# Patient Record
Sex: Male | Born: 1945
Health system: Southern US, Community
[De-identification: ages and names within clinical notes are randomized; demographics above are authoritative.]

## PROBLEM LIST (undated history)

## (undated) DIAGNOSIS — Z8579 Personal history of other malignant neoplasms of lymphoid, hematopoietic and related tissues: Secondary | ICD-10-CM

## (undated) DIAGNOSIS — G4730 Sleep apnea, unspecified: Secondary | ICD-10-CM

## (undated) DIAGNOSIS — C61 Malignant neoplasm of prostate: Secondary | ICD-10-CM

## (undated) DIAGNOSIS — T4145XA Adverse effect of unspecified anesthetic, initial encounter: Secondary | ICD-10-CM

## (undated) DIAGNOSIS — N189 Chronic kidney disease, unspecified: Secondary | ICD-10-CM

## (undated) DIAGNOSIS — I739 Peripheral vascular disease, unspecified: Secondary | ICD-10-CM

## (undated) DIAGNOSIS — J189 Pneumonia, unspecified organism: Secondary | ICD-10-CM

## (undated) DIAGNOSIS — T8859XA Other complications of anesthesia, initial encounter: Secondary | ICD-10-CM

## (undated) DIAGNOSIS — D759 Disease of blood and blood-forming organs, unspecified: Secondary | ICD-10-CM

## (undated) DIAGNOSIS — M2550 Pain in unspecified joint: Secondary | ICD-10-CM

## (undated) DIAGNOSIS — M1990 Unspecified osteoarthritis, unspecified site: Secondary | ICD-10-CM

## (undated) DIAGNOSIS — D7582 Heparin induced thrombocytopenia (HIT): Secondary | ICD-10-CM

## (undated) DIAGNOSIS — E785 Hyperlipidemia, unspecified: Secondary | ICD-10-CM

## (undated) DIAGNOSIS — I82409 Acute embolism and thrombosis of unspecified deep veins of unspecified lower extremity: Secondary | ICD-10-CM

## (undated) DIAGNOSIS — E8581 Light chain (AL) amyloidosis: Secondary | ICD-10-CM

## (undated) DIAGNOSIS — G8250 Quadriplegia, unspecified: Secondary | ICD-10-CM

## (undated) HISTORY — PX: PROSTATECTOMY: SHX69

## (undated) HISTORY — DX: Sleep apnea, unspecified: G47.30

## (undated) HISTORY — PX: LIPOMA EXCISION: SHX5283

## (undated) HISTORY — PX: POSTERIOR LAMINECTOMY / DECOMPRESSION LUMBAR SPINE: SUR740

## (undated) HISTORY — PX: LIMBAL STEM CELL TRANSPLANT: SHX1969

## (undated) HISTORY — PX: HERNIA REPAIR: SHX51

## (undated) HISTORY — DX: Hyperlipidemia, unspecified: E78.5

## (undated) HISTORY — PX: TONSILLECTOMY: SUR1361

## (undated) HISTORY — PX: BACK SURGERY: SHX140

## (undated) HISTORY — DX: Disease of blood and blood-forming organs, unspecified: D75.9

## (undated) HISTORY — PX: OTHER SURGICAL HISTORY: SHX169

## (undated) HISTORY — PX: COLONOSCOPY: SHX174

## (undated) HISTORY — DX: Malignant neoplasm of prostate: C61

---

## 2001-12-27 ENCOUNTER — Ambulatory Visit (HOSPITAL_COMMUNITY): Admission: RE | Admit: 2001-12-27 | Discharge: 2001-12-27 | Payer: Self-pay | Admitting: *Deleted

## 2003-07-19 ENCOUNTER — Ambulatory Visit (HOSPITAL_BASED_OUTPATIENT_CLINIC_OR_DEPARTMENT_OTHER): Admission: RE | Admit: 2003-07-19 | Discharge: 2003-07-19 | Payer: Self-pay | Admitting: Family Medicine

## 2003-12-25 ENCOUNTER — Ambulatory Visit: Admission: RE | Admit: 2003-12-25 | Discharge: 2004-01-27 | Payer: Self-pay | Admitting: Radiation Oncology

## 2004-01-29 ENCOUNTER — Inpatient Hospital Stay (HOSPITAL_COMMUNITY): Admission: RE | Admit: 2004-01-29 | Discharge: 2004-02-01 | Payer: Self-pay | Admitting: Urology

## 2006-12-07 ENCOUNTER — Encounter: Admission: RE | Admit: 2006-12-07 | Discharge: 2006-12-07 | Payer: Self-pay | Admitting: Orthopedic Surgery

## 2007-01-23 ENCOUNTER — Encounter: Admission: RE | Admit: 2007-01-23 | Discharge: 2007-01-23 | Payer: Self-pay | Admitting: Neurosurgery

## 2007-09-27 ENCOUNTER — Inpatient Hospital Stay (HOSPITAL_COMMUNITY): Admission: RE | Admit: 2007-09-27 | Discharge: 2007-09-30 | Payer: Self-pay | Admitting: Neurosurgery

## 2007-09-27 ENCOUNTER — Encounter (INDEPENDENT_AMBULATORY_CARE_PROVIDER_SITE_OTHER): Payer: Self-pay | Admitting: Neurosurgery

## 2007-11-23 HISTORY — PX: CARPAL TUNNEL RELEASE: SHX101

## 2008-09-02 ENCOUNTER — Ambulatory Visit (HOSPITAL_COMMUNITY): Admission: RE | Admit: 2008-09-02 | Discharge: 2008-09-02 | Payer: Self-pay | Admitting: Neurosurgery

## 2008-11-22 HISTORY — PX: CARDIAC CATHETERIZATION: SHX172

## 2009-11-17 ENCOUNTER — Inpatient Hospital Stay (HOSPITAL_BASED_OUTPATIENT_CLINIC_OR_DEPARTMENT_OTHER): Admission: RE | Admit: 2009-11-17 | Discharge: 2009-11-17 | Payer: Self-pay | Admitting: Interventional Cardiology

## 2011-04-06 NOTE — Op Note (Signed)
NAME:  Brent Allison, COSMA NO.:  [id number]   MEDICAL RECORD NO.:  [id number]          PATIENT TYPE:  AMB   LOCATION:  SDS                          FACILITY:  [facility]   PHYSICIAN:  Cristi Loron, M.D.DATE OF BIRTH:  1945/11/25   DATE OF PROCEDURE:  09/02/2008  DATE OF DISCHARGE:                               OPERATIVE REPORT   BRIEF HISTORY:  The patient is a 65 year old white male who suffered  from bilateral hand numbness, left greater than right consistent with  carpal tunnel syndrome.  He failed medical management and was worked up  with NCVs, which concerned bilateral carpal tunnel syndrome.  I  discussed various treatments, including surgery.  The patient has  weighed the risks, benefits, and alternatives of surgery and decided to  proceed with a left carpal tunnel release.   PREOPERATIVE DIAGNOSIS:  Left carpal tunnel syndrome.   POSTOPERATIVE DIAGNOSIS:  Left carpal tunnel syndrome.   PROCEDURE:  Left carpal tunnel release (median nerve neurolysis at the  wrist).   SURGEON:  Cristi Loron, MD   ASSISTANT:  None.   ANESTHESIA:  MAC.   ESTIMATED BLOOD LOSS:  Minimal.   SPECIMENS:  None.   DRAINS:  None.   COMPLICATIONS:  None.   DESCRIPTION OF PROCEDURE:  The patient was brought to the operating room  by the anesthesia team and MAC anesthesia was induced.  The patient's  left hand and arm was then prepared with Betadine scrub and Betadine  solution.  Sterile drapes were applied and then injected the area to be  incised with Marcaine solution.  I then used a scalpel to make a  incision near the patient's palmar crease in the midline.  I then used a  Horticulturist, commercial for exposure and then dissected deeper.  We divided  the superficial fascia with the 15-blade scalpel and then exposed the  transverse carpal ligament.  We used a 15-blade scalpel to incise the  ligament and we identified the median nerve.  Staying along the ulnar  aspect of  the median nerve, we divided the transverse carpal ligament  proximally and distally using a 15-blade scalpel and scissors.  We got a  good decompression of the nerve both proximally and distally.  We then  obtained hemostasis using bipolar cautery.  We irrigated the wound with  bacitracin solution and then removed the retractor.  We reapproximated  the patient's subcutaneous tissue with interrupted 3-0 Vicryl suture and  the skin with a running 3-0 nylon suture.  The wound was then cleansed  with bacitracin solution, coated with  bacitracin ointment, and a sterile dressing was applied.  The drapes  were removed and the patient was subsequently transported to the  Postanesthesia Care Unit in stable condition.  All sponge, instrument,  and needle counts were correct at the end of this case.      Cristi Loron, M.D.  Electronically Signed     JDJ/MEDQ  D:  09/02/2008  T:  09/02/2008  Job:  [id number]

## 2011-04-06 NOTE — Discharge Summary (Signed)
NAME:  Brent, Allison NO.:  [id number]   MEDICAL RECORD NO.:  [id number]          PATIENT TYPE:  INP   LOCATION:  3008                         FACILITY:  [facility]   PHYSICIAN:  Stefani Dama, M.D.  DATE OF BIRTH:  1946-01-04   DATE OF ADMISSION:  09/27/2007  DATE OF DISCHARGE:                               DISCHARGE SUMMARY   ADMITTING DIAGNOSIS:  Lumbar spondylolisthesis L4-L5 with lumbar  radiculopathy and lumbago.   FINAL DIAGNOSIS:  Lumbar spondylolisthesis L4-L5, acquired with facet  arthropathy, disk degeneration, lumbar stenosis, lumbar radiculopathy  and lumbago.   CONDITION ON DISCHARGE:  Improving.   HOSPITAL COURSE:  [name] is a 65 year old individual who was  had significant back pain and bilateral lower extremity pain.  He was  advised regarding the need for surgical decompression and arthrodesis  having failed efforts at conservative management.  He was taken to the  operating room on September 27, 2007, when he underwent surgical  decompression arthrodesis with posterior lumbar interbody fixation and  fusion.  He tolerated this procedure well.  Postoperatively, he was  mobilized, Foley catheter was removed the day after surgery.  His  incision has remained clean and dry and clinically the patient is  tolerating oral pain medications.  He has been advised as to his  postoperative activities.  He is given prescriptions for Percocet 10/650  for pain control and he will be seen in the office in approximately  three weeks' time for further follow-up.  He is also given a  prescription for Valium 5 mg.      Stefani Dama, M.D.  Electronically Signed     HJE/MEDQ  D:  09/30/2007  T:  10/01/2007  Job:  [id number]

## 2011-04-06 NOTE — Op Note (Signed)
NAME:  Brent Allison, Brent Allison NO.:  [id number]   MEDICAL RECORD NO.:  [id number]          PATIENT TYPE:  INP   LOCATION:  3008                         FACILITY:  [facility]   PHYSICIAN:  Cristi Loron, M.D.DATE OF BIRTH:  February 07, 1946   DATE OF PROCEDURE:  09/27/2007  DATE OF DISCHARGE:                               OPERATIVE REPORT   BRIEF HISTORY:  The patient is a 65 year old white male who has suffered  from signs and symptoms consistent with neurogenic claudication.  He  failed medical management, was worked up with a lumbar MRI, which  demonstrated he had a spondylolisthesis and severe multifactorial spinal  stenosis at L4-L5.  I discussed the various treatment options with the  patient including surgery.  He has weighed the risks, benefits, and  alternatives of surgery and decided to proceed with an L4-L5  decompression and fusion.   PREOPERATIVE DIAGNOSES:  L4-L5 grade 1 acquired spondylolisthesis facet  arthropathy, disc degeneration, stenosis, lumbar radiculopathy, and  lumbago.   POSTOPERATIVE DIAGNOSES:  L4-L5 grade 1 acquired spondylolisthesis facet  arthropathy, disc degeneration, stenosis, lumbar radiculopathy, and  lumbago.   PROCEDURE:  Bilateral L4 decompressive laminotomies to treat the severe  spinal stenosis at L4-L5 and decompressing bilateral L4-L5 nerve roots;  L4-L5 posterior lumbar interbody fusion with local morselized autograft  bone; insertion of L4-L5 interbody prosthesis (Capstone PEEK interbody  prosthesis); L4-L5 posterior nonsegmental instrumentation with Legacy  titanium pedicle screws and rods; L4-L5 posterolateral arthrodesis with  local morselized autograft and VITOSS bone-graft extender.   SURGEON:  Cristi Loron, M.D.   ASSISTANT:  Clydene Fake, M.D.   ANESTHESIA:  General endotracheal.   ESTIMATED BLOOD LOSS:  400 mL.   SPECIMENS:  A facet cyst on the left.   DRAINS:  None.   COMPLICATIONS:  None.   DESCRIPTION OF PROCEDURE:  The patient was brought to the operating room  by the anesthesia team.  General endotracheal anesthesia was induced.  The patient was turned to the prone position on Wilson frame.  His  lumbosacral region was then shaved with clippers and prepared with  Betadine scrub and Betadine solution.  Sterile drapes were applied.  I  then injected the area to be incised with Marcaine with epinephrine  solution and used a scalpel to make a linear midline incision over the  L4-L5 interspace.  I used electrocautery to perform bilateral  subperiosteal dissection exposing the spinous process lamina of the L3,  L4, and L5.  We obtained intraoperative radiograph to confirm our  location and then inserted the Versa-Trac retractor for exposure.  We  encountered quite a bit of facet arthropathy, in fact there was a large  cyst emanating from the left L4-L5 facet.  We removed part of the cyst  and sent it for permanent section.   We began the decompression by performing bilateral L4 laminotomies.  We  widened the laminotomies and performed a medial facetectomy at L4-L5; we  encountered severe stenosis bilaterally.  We removed the excess ligament  flavum from lateral recesses and performed foraminotomies about the  bilateral L4 and L5 nerve root.  Of note, this decompression was in  excess to what was needed to perform the interbody fusion; it was needed  because of severe stenosis at this level.   Having completed the decompression, we now turned attention to  arthrodesis.  We incised the L4-L5 intervertebral disc and performed a  intervertebral discectomy bilaterally.  We prepared the vertebral  endplates for fusion by using the curettes, removing the soft tissue.  We used trial spacers and determined to use a 14 mm x 26 mm interbody  Capstone prosthesis.  We prefilled the prosthesis with a combination of  local autograft bone and VITOSS, and then filled ventral, lateral, and   medial disc space with VITOSS and local autograft bone.  We then  inserted the prosthesis into the interspace, of course after retracting  the neural structures out of the harm's way.  There was a good snug fit  of the prosthesis bilaterally.  This completed the posterior lumbar  interbody fusion and insertion of the prosthesis.   We now turned attention to the posterior nonsegmental instrumentation.  We under fluoroscopic guidance cannulated the bilateral L4 and L5  pedicles with bone probe.  We tapped the pedicles with a 5.5 mm tap and  then inserted a 6.5 mm x 55 mm pedicle screws bilaterally at L4 and 6.5  mm x 55 mm bilaterally at L5 under fluoroscopic guidance.  I should  mention prior to placing the pedicle screws, we probed inside the tapped  pedicles to rule out cortical breeches.  After placing the pedicle  screws, we palpated along the medial aspect of the bilateral L4 and L5  pedicles and noted there were no cortical breeches, and the L4 and L5  nerve roots were not injured.  We then connected the unilateral pedicle  screws with a lordotic rod.  We compressed the construct and then  secured the rod in place with the capsule, which we tightened  appropriately completing the instrumentation.   We now turned attention to posterolateral arthrodesis.  We used a high-  speed drill to decorticate the bilateral L4 and L5 facet joint and pars  region and transverse processes.  We laid a combination of local  morselized autograft bone and VITOSS bone-graft extender over these  decorticated posterolateral structures completing the posterolateral  arthrodesis.  We then inspected the dura and palpated along the ventral  surface of the dura and along the exit route of the bilateral L4 and L5  nerve roots and noted the neural structures were well decompressed.  We  obtained hemostasis using bipolar cautery.  We irrigated the wound out  with bacitracin solution and then removed the  retractor.  We  reapproximated the patient's thoracolumbar fascia with interrupted #1  Vicryl suture.  The subcutaneous tissue with interrupted 2-0 Vicryl  suture and the skin with Steri-Strips and Benzoin.  The wound was then  coated with bacitracin ointment.  A sterile dressing was applied.  The  drapes were removed, and the patient was subsequently returned to the  supine position where he was extubated by the anesthesia team and  transported to the post-anesthesia care unit in a stable condition.  All  sponge, instrument, and needle counts were correct at the end of this  case.      Cristi Loron, M.D.  Electronically Signed     JDJ/MEDQ  D:  09/27/2007  T:  09/28/2007  Job:  [id number]

## 2011-06-10 ENCOUNTER — Other Ambulatory Visit: Payer: Self-pay | Admitting: Ophthalmology

## 2011-07-05 ENCOUNTER — Other Ambulatory Visit: Payer: Self-pay | Admitting: Oncology

## 2011-07-05 ENCOUNTER — Encounter (HOSPITAL_BASED_OUTPATIENT_CLINIC_OR_DEPARTMENT_OTHER): Payer: Medicare Other | Admitting: Oncology

## 2011-07-05 DIAGNOSIS — E8589 Other amyloidosis: Secondary | ICD-10-CM

## 2011-07-05 DIAGNOSIS — C61 Malignant neoplasm of prostate: Secondary | ICD-10-CM

## 2011-07-05 LAB — CBC WITH DIFFERENTIAL/PLATELET
BASO%: 0.4 % (ref 0.0–2.0)
Basophils Absolute: 0 10*3/uL (ref 0.0–0.1)
EOS%: 1.6 % (ref 0.0–7.0)
Eosinophils Absolute: 0.1 10*3/uL (ref 0.0–0.5)
HCT: 38 % — ABNORMAL LOW (ref 38.4–49.9)
HGB: 13.1 g/dL (ref 13.0–17.1)
LYMPH%: 25 % (ref 14.0–49.0)
MCH: 32.4 pg (ref 27.2–33.4)
MCHC: 34.3 g/dL (ref 32.0–36.0)
MCV: 94.4 fL (ref 79.3–98.0)
MONO#: 0.7 10*3/uL (ref 0.1–0.9)
MONO%: 8.9 % (ref 0.0–14.0)
NEUT#: 4.9 10*3/uL (ref 1.5–6.5)
NEUT%: 64.1 % (ref 39.0–75.0)
Platelets: 184 10*3/uL (ref 140–400)
RBC: 4.03 10*6/uL — ABNORMAL LOW (ref 4.20–5.82)
RDW: 13.3 % (ref 11.0–14.6)
WBC: 7.7 10*3/uL (ref 4.0–10.3)
lymph#: 1.9 10*3/uL (ref 0.9–3.3)

## 2011-07-05 LAB — COMPREHENSIVE METABOLIC PANEL
ALT: 13 U/L (ref 0–53)
AST: 19 U/L (ref 0–37)
Albumin: 3.5 g/dL (ref 3.5–5.2)
Alkaline Phosphatase: 82 U/L (ref 39–117)
BUN: 17 mg/dL (ref 6–23)
CO2: 29 mEq/L (ref 19–32)
Calcium: 9.9 mg/dL (ref 8.4–10.5)
Chloride: 103 mEq/L (ref 96–112)
Creatinine, Ser: 0.89 mg/dL (ref 0.50–1.35)
Glucose, Bld: 91 mg/dL (ref 70–99)
Potassium: 3.7 mEq/L (ref 3.5–5.3)
Sodium: 139 mEq/L (ref 135–145)
Total Bilirubin: 0.3 mg/dL (ref 0.3–1.2)
Total Protein: 6.2 g/dL (ref 6.0–8.3)

## 2011-07-05 LAB — URINALYSIS, MICROSCOPIC - CHCC
Bilirubin (Urine): NEGATIVE
Blood: NEGATIVE
Glucose: NEGATIVE g/dL
Ketones: NEGATIVE mg/dL
Leukocyte Esterase: NEGATIVE
Nitrite: NEGATIVE
Protein: NEGATIVE mg/dL
RBC count: NEGATIVE (ref 0–2)
Specific Gravity, Urine: 1.015 (ref 1.003–1.035)
WBC, UA: NEGATIVE (ref 0–2)
pH: 6.5 (ref 4.6–8.0)

## 2011-07-05 LAB — LACTATE DEHYDROGENASE: LDH: 160 U/L (ref 94–250)

## 2011-07-08 LAB — SPEP & IFE WITH QIG
Albumin ELP: 61.7 % (ref 55.8–66.1)
Alpha-1-Globulin: 4.6 % (ref 2.9–4.9)
Alpha-2-Globulin: 12.7 % — ABNORMAL HIGH (ref 7.1–11.8)
Beta 2: 4.5 % (ref 3.2–6.5)
Beta Globulin: 6.2 % (ref 4.7–7.2)
Gamma Globulin: 10.3 % — ABNORMAL LOW (ref 11.1–18.8)
IgA: 92 mg/dL (ref 68–379)
IgG (Immunoglobin G), Serum: 632 mg/dL — ABNORMAL LOW (ref 650–1600)
IgM, Serum: 61 mg/dL (ref 41–251)
Total Protein, Serum Electrophoresis: 5.9 g/dL — ABNORMAL LOW (ref 6.0–8.3)

## 2011-07-08 LAB — KAPPA/LAMBDA LIGHT CHAINS
Kappa free light chain: 0.95 mg/dL (ref 0.33–1.94)
Kappa:Lambda Ratio: 0.02 — ABNORMAL LOW (ref 0.26–1.65)
Lambda Free Lght Chn: 50.4 mg/dL — ABNORMAL HIGH (ref 0.57–2.63)

## 2011-07-08 LAB — BETA 2 MICROGLOBULIN, SERUM: Beta-2 Microglobulin: 1.98 mg/L — ABNORMAL HIGH (ref 1.01–1.73)

## 2011-07-22 ENCOUNTER — Encounter (HOSPITAL_BASED_OUTPATIENT_CLINIC_OR_DEPARTMENT_OTHER): Payer: Medicare Other | Admitting: Oncology

## 2011-07-22 ENCOUNTER — Ambulatory Visit (HOSPITAL_COMMUNITY)
Admission: RE | Admit: 2011-07-22 | Discharge: 2011-07-22 | Disposition: A | Discharge status: Home / Self Care | Payer: Medicare Other | Source: Ambulatory Visit | Attending: Oncology | Admitting: Oncology

## 2011-07-22 ENCOUNTER — Other Ambulatory Visit: Payer: Self-pay | Admitting: Oncology

## 2011-07-22 DIAGNOSIS — M47812 Spondylosis without myelopathy or radiculopathy, cervical region: Secondary | ICD-10-CM | POA: Insufficient documentation

## 2011-07-22 DIAGNOSIS — Z981 Arthrodesis status: Secondary | ICD-10-CM | POA: Insufficient documentation

## 2011-07-22 DIAGNOSIS — M5137 Other intervertebral disc degeneration, lumbosacral region: Secondary | ICD-10-CM | POA: Insufficient documentation

## 2011-07-22 DIAGNOSIS — C9000 Multiple myeloma not having achieved remission: Secondary | ICD-10-CM

## 2011-07-22 DIAGNOSIS — C61 Malignant neoplasm of prostate: Secondary | ICD-10-CM

## 2011-07-22 DIAGNOSIS — M51379 Other intervertebral disc degeneration, lumbosacral region without mention of lumbar back pain or lower extremity pain: Secondary | ICD-10-CM | POA: Insufficient documentation

## 2011-07-22 DIAGNOSIS — E8589 Other amyloidosis: Secondary | ICD-10-CM

## 2011-07-29 ENCOUNTER — Other Ambulatory Visit: Payer: Self-pay | Admitting: Oncology

## 2011-07-29 ENCOUNTER — Other Ambulatory Visit (HOSPITAL_COMMUNITY)
Admission: RE | Admit: 2011-07-29 | Discharge: 2011-07-29 | Disposition: A | Discharge status: Home / Self Care | Payer: Medicare Other | Source: Ambulatory Visit | Attending: Oncology | Admitting: Oncology

## 2011-07-29 ENCOUNTER — Encounter (HOSPITAL_BASED_OUTPATIENT_CLINIC_OR_DEPARTMENT_OTHER): Payer: Medicare Other | Admitting: Oncology

## 2011-07-29 DIAGNOSIS — E8589 Other amyloidosis: Secondary | ICD-10-CM

## 2011-07-29 DIAGNOSIS — E859 Amyloidosis, unspecified: Secondary | ICD-10-CM | POA: Insufficient documentation

## 2011-07-29 DIAGNOSIS — R809 Proteinuria, unspecified: Secondary | ICD-10-CM | POA: Insufficient documentation

## 2011-07-29 DIAGNOSIS — D72822 Plasmacytosis: Secondary | ICD-10-CM | POA: Insufficient documentation

## 2011-07-29 LAB — DIFFERENTIAL
Basophils Absolute: 0 10*3/uL (ref 0.0–0.1)
Basophils Relative: 0 % (ref 0–1)
Eosinophils Absolute: 0.1 10*3/uL (ref 0.0–0.7)
Eosinophils Relative: 2 % (ref 0–5)
Lymphocytes Relative: 26 % (ref 12–46)
Lymphs Abs: 1.7 10*3/uL (ref 0.7–4.0)
Monocytes Absolute: 0.8 10*3/uL (ref 0.1–1.0)
Monocytes Relative: 12 % (ref 3–12)
Neutro Abs: 4.1 10*3/uL (ref 1.7–7.7)
Neutrophils Relative %: 60 % (ref 43–77)

## 2011-07-29 LAB — CBC
HCT: 39.6 % (ref 39.0–52.0)
Hemoglobin: 13.4 g/dL (ref 13.0–17.0)
MCH: 31.3 pg (ref 26.0–34.0)
MCHC: 33.8 g/dL (ref 30.0–36.0)
MCV: 92.5 fL (ref 78.0–100.0)
Platelets: 213 10*3/uL (ref 150–400)
RBC: 4.28 MIL/uL (ref 4.22–5.81)
RDW: 12.9 % (ref 11.5–15.5)
WBC: 6.8 10*3/uL (ref 4.0–10.5)

## 2011-08-04 ENCOUNTER — Encounter (HOSPITAL_BASED_OUTPATIENT_CLINIC_OR_DEPARTMENT_OTHER): Payer: Medicare Other | Admitting: Oncology

## 2011-08-04 DIAGNOSIS — C61 Malignant neoplasm of prostate: Secondary | ICD-10-CM

## 2011-08-04 DIAGNOSIS — E8589 Other amyloidosis: Secondary | ICD-10-CM

## 2011-08-09 ENCOUNTER — Other Ambulatory Visit: Payer: Self-pay | Admitting: Oncology

## 2011-08-11 LAB — UIFE/LIGHT CHAINS/TP QN, 24-HR UR
Albumin, U: DETECTED
Alpha 1, Urine: DETECTED — AB
Alpha 2, Urine: DETECTED — AB
Beta, Urine: DETECTED — AB
Free Kappa Lt Chains,Ur: 1.56 mg/dL (ref 0.14–2.42)
Free Kappa/Lambda Ratio: 0.07 ratio — ABNORMAL LOW (ref 2.04–10.37)
Free Lambda Excretion/Day: 148.26 mg/d
Free Lambda Lt Chains,Ur: 21 mg/dL — ABNORMAL HIGH (ref 0.02–0.67)
Free Lt Chn Excr Rate: 11.01 mg/d
Gamma Globulin, Urine: DETECTED — AB
Time: 24 hours
Total Protein, Urine-Ur/day: 163 mg/d — ABNORMAL HIGH (ref 10–140)
Total Protein, Urine: 23.1 mg/dL
Volume, Urine: 706 mL

## 2011-08-12 DIAGNOSIS — E8809 Other disorders of plasma-protein metabolism, not elsewhere classified: Secondary | ICD-10-CM

## 2011-08-12 DIAGNOSIS — E859 Amyloidosis, unspecified: Secondary | ICD-10-CM

## 2011-08-12 DIAGNOSIS — Z5112 Encounter for antineoplastic immunotherapy: Secondary | ICD-10-CM

## 2011-08-18 ENCOUNTER — Encounter (HOSPITAL_BASED_OUTPATIENT_CLINIC_OR_DEPARTMENT_OTHER): Payer: Medicare Other | Admitting: Oncology

## 2011-08-18 ENCOUNTER — Other Ambulatory Visit: Payer: Self-pay | Admitting: Oncology

## 2011-08-18 DIAGNOSIS — Z5112 Encounter for antineoplastic immunotherapy: Secondary | ICD-10-CM

## 2011-08-18 DIAGNOSIS — C9000 Multiple myeloma not having achieved remission: Secondary | ICD-10-CM

## 2011-08-18 DIAGNOSIS — E859 Amyloidosis, unspecified: Secondary | ICD-10-CM

## 2011-08-18 DIAGNOSIS — C61 Malignant neoplasm of prostate: Secondary | ICD-10-CM

## 2011-08-18 DIAGNOSIS — E8589 Other amyloidosis: Secondary | ICD-10-CM

## 2011-08-18 DIAGNOSIS — E8809 Other disorders of plasma-protein metabolism, not elsewhere classified: Secondary | ICD-10-CM

## 2011-08-18 LAB — CBC WITH DIFFERENTIAL/PLATELET
BASO%: 0.3 % (ref 0.0–2.0)
Basophils Absolute: 0 10*3/uL (ref 0.0–0.1)
EOS%: 2 % (ref 0.0–7.0)
Eosinophils Absolute: 0.1 10*3/uL (ref 0.0–0.5)
HCT: 39.4 % (ref 38.4–49.9)
HGB: 13.6 g/dL (ref 13.0–17.1)
LYMPH%: 24.3 % (ref 14.0–49.0)
MCH: 32.2 pg (ref 27.2–33.4)
MCHC: 34.5 g/dL (ref 32.0–36.0)
MCV: 93.3 fL (ref 79.3–98.0)
MONO#: 0.8 10*3/uL (ref 0.1–0.9)
MONO%: 11.1 % (ref 0.0–14.0)
NEUT#: 4.7 10*3/uL (ref 1.5–6.5)
NEUT%: 62.3 % (ref 39.0–75.0)
Platelets: 190 10*3/uL (ref 140–400)
RBC: 4.22 10*6/uL (ref 4.20–5.82)
RDW: 13.2 % (ref 11.0–14.6)
WBC: 7.5 10*3/uL (ref 4.0–10.3)
lymph#: 1.8 10*3/uL (ref 0.9–3.3)
nRBC: 0 % (ref 0–0)

## 2011-08-23 LAB — CBC
HCT: 43.1
Hemoglobin: 14.7
MCHC: 34.2
MCV: 96.8
Platelets: 213
RBC: 4.45
RDW: 13
WBC: 5.4

## 2011-08-25 ENCOUNTER — Encounter (HOSPITAL_BASED_OUTPATIENT_CLINIC_OR_DEPARTMENT_OTHER): Payer: Medicare Other | Admitting: Oncology

## 2011-08-25 ENCOUNTER — Other Ambulatory Visit: Payer: Self-pay | Admitting: Oncology

## 2011-08-25 DIAGNOSIS — E8589 Other amyloidosis: Secondary | ICD-10-CM

## 2011-08-25 DIAGNOSIS — C61 Malignant neoplasm of prostate: Secondary | ICD-10-CM

## 2011-08-25 DIAGNOSIS — E859 Amyloidosis, unspecified: Secondary | ICD-10-CM

## 2011-08-25 DIAGNOSIS — Z5112 Encounter for antineoplastic immunotherapy: Secondary | ICD-10-CM

## 2011-08-25 DIAGNOSIS — C9000 Multiple myeloma not having achieved remission: Secondary | ICD-10-CM

## 2011-08-25 DIAGNOSIS — E8809 Other disorders of plasma-protein metabolism, not elsewhere classified: Secondary | ICD-10-CM

## 2011-08-25 LAB — CBC WITH DIFFERENTIAL/PLATELET
BASO%: 0.8 % (ref 0.0–2.0)
Basophils Absolute: 0.1 10*3/uL (ref 0.0–0.1)
EOS%: 1.3 % (ref 0.0–7.0)
Eosinophils Absolute: 0.1 10*3/uL (ref 0.0–0.5)
HCT: 39.1 % (ref 38.4–49.9)
HGB: 13.4 g/dL (ref 13.0–17.1)
LYMPH%: 24 % (ref 14.0–49.0)
MCH: 32 pg (ref 27.2–33.4)
MCHC: 34.3 g/dL (ref 32.0–36.0)
MCV: 93.3 fL (ref 79.3–98.0)
MONO#: 0.8 10*3/uL (ref 0.1–0.9)
MONO%: 9 % (ref 0.0–14.0)
NEUT#: 6 10*3/uL (ref 1.5–6.5)
NEUT%: 64.9 % (ref 39.0–75.0)
Platelets: 192 10*3/uL (ref 140–400)
RBC: 4.19 10*6/uL — ABNORMAL LOW (ref 4.20–5.82)
RDW: 13.3 % (ref 11.0–14.6)
WBC: 9.3 10*3/uL (ref 4.0–10.3)
lymph#: 2.2 10*3/uL (ref 0.9–3.3)

## 2011-08-31 LAB — BASIC METABOLIC PANEL
BUN: 14
CO2: 30
Calcium: 8.5
Chloride: 103
Creatinine, Ser: 0.91
GFR calc Af Amer: >60
GFR calc non Af Amer: >60
Glucose, Bld: 125 — ABNORMAL HIGH
Potassium: 4.3
Sodium: 136

## 2011-08-31 LAB — CBC
HCT: 36.8 — ABNORMAL LOW
Hemoglobin: 12.5 — ABNORMAL LOW
MCHC: 34
MCV: 96.8
Platelets: 153
RBC: 3.81 — ABNORMAL LOW
RDW: 12.9
WBC: 9.9

## 2011-09-01 ENCOUNTER — Other Ambulatory Visit: Payer: Self-pay | Admitting: Oncology

## 2011-09-01 ENCOUNTER — Encounter (HOSPITAL_BASED_OUTPATIENT_CLINIC_OR_DEPARTMENT_OTHER): Payer: Medicare Other | Admitting: Oncology

## 2011-09-01 DIAGNOSIS — C61 Malignant neoplasm of prostate: Secondary | ICD-10-CM

## 2011-09-01 DIAGNOSIS — E8589 Other amyloidosis: Secondary | ICD-10-CM

## 2011-09-01 DIAGNOSIS — Z23 Encounter for immunization: Secondary | ICD-10-CM

## 2011-09-01 LAB — COMPREHENSIVE METABOLIC PANEL
ALT: 14 U/L (ref 0–53)
ALT: 31
AST: 13 U/L (ref 0–37)
AST: 26
Albumin: 4 g/dL (ref 3.5–5.2)
Albumin: 3.8
Alkaline Phosphatase: 80 U/L (ref 39–117)
Alkaline Phosphatase: 53
BUN: 13 mg/dL (ref 6–23)
BUN: 10
CO2: 27 mEq/L (ref 19–32)
CO2: 27
Calcium: 9.5 mg/dL (ref 8.4–10.5)
Calcium: 9.8
Chloride: 106 mEq/L (ref 96–112)
Chloride: 107
Creatinine, Ser: 0.95 mg/dL (ref 0.50–1.35)
Creatinine, Ser: 0.99
GFR calc Af Amer: >60
GFR calc non Af Amer: >60
Glucose, Bld: 98 mg/dL (ref 70–99)
Glucose, Bld: 91
Potassium: 4.4 mEq/L (ref 3.5–5.3)
Potassium: 4.3
Sodium: 141 mEq/L (ref 135–145)
Sodium: 141
Total Bilirubin: 0.4 mg/dL (ref 0.3–1.2)
Total Bilirubin: 0.7
Total Protein: 5.8 g/dL — ABNORMAL LOW (ref 6.0–8.3)
Total Protein: 6.3

## 2011-09-01 LAB — CBC WITH DIFFERENTIAL/PLATELET
BASO%: 0.3 % (ref 0.0–2.0)
Basophils Absolute: 0 10*3/uL (ref 0.0–0.1)
EOS%: 1.6 % (ref 0.0–7.0)
Eosinophils Absolute: 0.1 10*3/uL (ref 0.0–0.5)
HCT: 40.9 % (ref 38.4–49.9)
HGB: 13.9 g/dL (ref 13.0–17.1)
LYMPH%: 18.6 % (ref 14.0–49.0)
MCH: 32.2 pg (ref 27.2–33.4)
MCHC: 34.1 g/dL (ref 32.0–36.0)
MCV: 94.5 fL (ref 79.3–98.0)
MONO#: 0.7 10*3/uL (ref 0.1–0.9)
MONO%: 9.8 % (ref 0.0–14.0)
NEUT#: 4.7 10*3/uL (ref 1.5–6.5)
NEUT%: 69.7 % (ref 39.0–75.0)
Platelets: 183 10*3/uL (ref 140–400)
RBC: 4.33 10*6/uL (ref 4.20–5.82)
RDW: 13.6 % (ref 11.0–14.6)
WBC: 6.7 10*3/uL (ref 4.0–10.3)
lymph#: 1.2 10*3/uL (ref 0.9–3.3)

## 2011-09-01 LAB — CBC
HCT: 43.6
Hemoglobin: 14.8
MCHC: 34
MCV: 96
Platelets: 200
RBC: 4.54
RDW: 13.1
WBC: 7.8

## 2011-09-01 LAB — TYPE AND SCREEN
ABO/RH(D): A POS
Antibody Screen: NEGATIVE

## 2011-09-01 LAB — ABO/RH: ABO/RH(D): A POS

## 2011-09-07 ENCOUNTER — Encounter: Payer: Self-pay | Admitting: *Deleted

## 2011-09-07 DIAGNOSIS — E785 Hyperlipidemia, unspecified: Secondary | ICD-10-CM | POA: Insufficient documentation

## 2011-09-15 ENCOUNTER — Encounter: Payer: Self-pay | Admitting: *Deleted

## 2011-09-16 ENCOUNTER — Other Ambulatory Visit: Payer: Self-pay | Admitting: Oncology

## 2011-09-16 ENCOUNTER — Encounter (HOSPITAL_BASED_OUTPATIENT_CLINIC_OR_DEPARTMENT_OTHER): Payer: Medicare Other | Admitting: Oncology

## 2011-09-16 DIAGNOSIS — E8809 Other disorders of plasma-protein metabolism, not elsewhere classified: Secondary | ICD-10-CM

## 2011-09-16 DIAGNOSIS — E8589 Other amyloidosis: Secondary | ICD-10-CM

## 2011-09-16 DIAGNOSIS — Z5112 Encounter for antineoplastic immunotherapy: Secondary | ICD-10-CM

## 2011-09-16 DIAGNOSIS — C61 Malignant neoplasm of prostate: Secondary | ICD-10-CM

## 2011-09-16 DIAGNOSIS — C9000 Multiple myeloma not having achieved remission: Secondary | ICD-10-CM

## 2011-09-16 LAB — CBC WITH DIFFERENTIAL/PLATELET
BASO%: 0.6 % (ref 0.0–2.0)
Basophils Absolute: 0 10*3/uL (ref 0.0–0.1)
EOS%: 1.9 % (ref 0.0–7.0)
Eosinophils Absolute: 0.1 10*3/uL (ref 0.0–0.5)
HCT: 40.1 % (ref 38.4–49.9)
HGB: 13.7 g/dL (ref 13.0–17.1)
LYMPH%: 26.1 % (ref 14.0–49.0)
MCH: 32.5 pg (ref 27.2–33.4)
MCHC: 34.1 g/dL (ref 32.0–36.0)
MCV: 95.1 fL (ref 79.3–98.0)
MONO#: 0.6 10*3/uL (ref 0.1–0.9)
MONO%: 12.6 % (ref 0.0–14.0)
NEUT#: 3 10*3/uL (ref 1.5–6.5)
NEUT%: 58.8 % (ref 39.0–75.0)
Platelets: 219 10*3/uL (ref 140–400)
RBC: 4.22 10*6/uL (ref 4.20–5.82)
RDW: 14 % (ref 11.0–14.6)
WBC: 5.1 10*3/uL (ref 4.0–10.3)
lymph#: 1.3 10*3/uL (ref 0.9–3.3)

## 2011-09-24 ENCOUNTER — Other Ambulatory Visit: Payer: Self-pay | Admitting: Oncology

## 2011-09-24 ENCOUNTER — Telehealth: Payer: Self-pay | Admitting: Oncology

## 2011-09-24 ENCOUNTER — Ambulatory Visit (HOSPITAL_COMMUNITY)
Admission: RE | Admit: 2011-09-24 | Discharge: 2011-09-24 | Disposition: A | Discharge status: Home / Self Care | Payer: Medicare Other | Source: Ambulatory Visit | Attending: Oncology | Admitting: Oncology

## 2011-09-24 ENCOUNTER — Encounter (HOSPITAL_BASED_OUTPATIENT_CLINIC_OR_DEPARTMENT_OTHER): Payer: Medicare Other | Admitting: Oncology

## 2011-09-24 DIAGNOSIS — G620 Drug-induced polyneuropathy: Secondary | ICD-10-CM

## 2011-09-24 DIAGNOSIS — E8589 Other amyloidosis: Secondary | ICD-10-CM

## 2011-09-24 DIAGNOSIS — Z79899 Other long term (current) drug therapy: Secondary | ICD-10-CM | POA: Insufficient documentation

## 2011-09-24 DIAGNOSIS — C9000 Multiple myeloma not having achieved remission: Secondary | ICD-10-CM

## 2011-09-24 DIAGNOSIS — T50904A Poisoning by unspecified drugs, medicaments and biological substances, undetermined, initial encounter: Secondary | ICD-10-CM

## 2011-09-24 DIAGNOSIS — Z8701 Personal history of pneumonia (recurrent): Secondary | ICD-10-CM | POA: Insufficient documentation

## 2011-09-24 DIAGNOSIS — C61 Malignant neoplasm of prostate: Secondary | ICD-10-CM

## 2011-09-24 DIAGNOSIS — Z5112 Encounter for antineoplastic immunotherapy: Secondary | ICD-10-CM

## 2011-09-24 LAB — CBC WITH DIFFERENTIAL/PLATELET
BASO%: 1.6 % (ref 0.0–2.0)
Basophils Absolute: 0.1 10*3/uL (ref 0.0–0.1)
EOS%: 1.6 % (ref 0.0–7.0)
Eosinophils Absolute: 0.1 10*3/uL (ref 0.0–0.5)
HCT: 42.9 % (ref 38.4–49.9)
HGB: 14.5 g/dL (ref 13.0–17.1)
LYMPH%: 20.7 % (ref 14.0–49.0)
MCH: 32 pg (ref 27.2–33.4)
MCHC: 33.8 g/dL (ref 32.0–36.0)
MCV: 94.7 fL (ref 79.3–98.0)
MONO#: 0.8 10*3/uL (ref 0.1–0.9)
MONO%: 11.6 % (ref 0.0–14.0)
NEUT#: 4.7 10*3/uL (ref 1.5–6.5)
NEUT%: 64.5 % (ref 39.0–75.0)
Platelets: 215 10*3/uL (ref 140–400)
RBC: 4.53 10*6/uL (ref 4.20–5.82)
RDW: 14.1 % (ref 11.0–14.6)
WBC: 7.3 10*3/uL (ref 4.0–10.3)
lymph#: 1.5 10*3/uL (ref 0.9–3.3)

## 2011-09-24 NOTE — Telephone Encounter (Signed)
gve the pt his nov 2012 appt calendar °

## 2011-09-27 NOTE — Progress Notes (Signed)
CC:   Daniel B. Yetta Barre, M.D. Anna Genre Little, M.D. Michel Harrow, MD  Brent Allison returns for a scheduled visit.  He was admitted to the hospital 2 weeks ago while vacationing in [place], [place].  He was diagnosed with "pneumonia."  He completed an outpatient course of antibiotics.  He reports developing sudden fever on the night of hospital admission.  He denies recurrent fever.  He denies shortness of breath and cough.  Brent Allison began a 2nd cycle of Velcade-based therapy on 10/25.  He continues weekly Cytoxan and Decadron.  He denies nausea and diarrhea. He has noted increased numbness in the fingertips.  This now interferes with activity.  PHYSICAL EXAMINATION:  Vital Signs:  Pressure 108/60, pulse 84, temperature 97.1.  HEENT:  There are geographic changes of the tongue. No discrete ulcer.  No thrush.  Lungs:  Clear.  Cardiac:  Regular rhythm.  Abdomen:  No hepatosplenomegaly.  Vascular:  No leg edema. Neurologic:  There is a mild decrease in light touch/pinprick at the fingertips bilaterally.  Light touch and pinprick appear intact over the hand and lower arm.  There is a moderate decrease in vibratory sense at the fingertips bilaterally.  LABORATORY DATA:  Hemoglobin 14.5, platelets 215,000, white count 7.3, ANC 4.7.  X-RAYS:  A chest x-ray on 09/24/2011 reveals no acute cardiopulmonary abnormality.  No confluent pulmonary opacity.  IMPRESSION AND PLAN: 1. 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 2. "Bruising" at the eyelids and mouth:  Likely related to     amyloidosis, persistent. 3. Numbness and loss of vibratory sense at the fingertips:  This     predated Velcade-based therapy, but has worsened.  It is not clear     whether the neuropathy is related to amyloidosis, Velcade, or a     combination of the two. 4. Serum monoclonal lambda light chains. 5. Elevated serum free lambda light chains. 6. Lambda light chain proteinuria. 7. Bone marrow  plasmacytosis:  Variable increase in plasma cells noted     on the bone marrow biopsy 07/29/2011 with plasma cells estimated to     represent between 4% and 20% of the cellular population. 8. Remote history of prostate cancer. 9. Sleep apnea. 10.Dyslipidemia. 11.Report of pneumonia on 2 occasions in 2011. 12.Admission to a hospital in [place], [place] October 2012 with     "pneumonia." 13.Low serum immunoglobulin G level. 14.Borderline-low hemoglobin level, improved. 15.Plasma cell dyscrasia with associated amyloidosis.     a.     Initiation of systemic therapy with Cytoxan, Velcade, and      Decadron 08/12/2011.  Cycle #2 was initiated on 09/16/2011. Brent Allison has recovered from the recent "pneumonia."  He will complete the 2nd cycle of Cytoxan/Velcade/Decadron therapy over the next few weeks.  He has significant neuropathy symptoms in the fingers  (he denies neuropathy symptoms in the feet).  The neuropathy symptoms predated Velcade-based therapy, but have worsened over the past several weeks. We decided to continue Velcade chemotherapy for now.  He will let us know if the neuropathy symptoms are worse when he is here next week.  He will undergo a restaging laboratory evaluation on 11/23.  He is scheduled for an office visit on 11/30.    ______________________________ Ladene Artist, M.D. GBS/MEDQ  D:  09/24/2011  T:  09/27/2011  Job:  [id number]

## 2011-10-01 ENCOUNTER — Other Ambulatory Visit (HOSPITAL_BASED_OUTPATIENT_CLINIC_OR_DEPARTMENT_OTHER): Payer: Medicare Other | Admitting: Lab

## 2011-10-01 ENCOUNTER — Ambulatory Visit (HOSPITAL_BASED_OUTPATIENT_CLINIC_OR_DEPARTMENT_OTHER): Payer: Medicare Other

## 2011-10-01 ENCOUNTER — Other Ambulatory Visit: Payer: Self-pay | Admitting: Oncology

## 2011-10-01 DIAGNOSIS — C61 Malignant neoplasm of prostate: Secondary | ICD-10-CM

## 2011-10-01 DIAGNOSIS — E8589 Other amyloidosis: Secondary | ICD-10-CM

## 2011-10-01 DIAGNOSIS — E859 Amyloidosis, unspecified: Secondary | ICD-10-CM

## 2011-10-01 DIAGNOSIS — G629 Polyneuropathy, unspecified: Secondary | ICD-10-CM | POA: Insufficient documentation

## 2011-10-01 DIAGNOSIS — C9000 Multiple myeloma not having achieved remission: Secondary | ICD-10-CM | POA: Insufficient documentation

## 2011-10-01 DIAGNOSIS — Z5112 Encounter for antineoplastic immunotherapy: Secondary | ICD-10-CM

## 2011-10-01 LAB — CBC WITH DIFFERENTIAL/PLATELET
BASO%: 0.3 % (ref 0.0–2.0)
Basophils Absolute: 0 10*3/uL (ref 0.0–0.1)
EOS%: 1.9 % (ref 0.0–7.0)
Eosinophils Absolute: 0.1 10*3/uL (ref 0.0–0.5)
HCT: 39 % (ref 38.4–49.9)
HGB: 13.1 g/dL (ref 13.0–17.1)
LYMPH%: 22.2 % (ref 14.0–49.0)
MCH: 32.1 pg (ref 27.2–33.4)
MCHC: 33.7 g/dL (ref 32.0–36.0)
MCV: 95.2 fL (ref 79.3–98.0)
MONO#: 0.7 10*3/uL (ref 0.1–0.9)
MONO%: 9.7 % (ref 0.0–14.0)
NEUT#: 4.7 10*3/uL (ref 1.5–6.5)
NEUT%: 65.9 % (ref 39.0–75.0)
Platelets: 170 10*3/uL (ref 140–400)
RBC: 4.1 10*6/uL — ABNORMAL LOW (ref 4.20–5.82)
RDW: 14.6 % (ref 11.0–14.6)
WBC: 7.1 10*3/uL (ref 4.0–10.3)
lymph#: 1.6 10*3/uL (ref 0.9–3.3)

## 2011-10-01 MED ORDER — BORTEZOMIB CHEMO SQ INJECTION 3.5 MG (2.5MG/ML)
1.3000 mg/m2 | Freq: Once | INTRAMUSCULAR | Status: AC
  Administered 2011-10-01: 2.75 mg via SUBCUTANEOUS
  Filled 2011-10-01: qty 2.75

## 2011-10-01 MED ORDER — ONDANSETRON HCL 8 MG PO TABS
8.0000 mg | ORAL_TABLET | Freq: Once | ORAL | Status: AC
  Administered 2011-10-01: 8 mg via ORAL

## 2011-10-06 ENCOUNTER — Ambulatory Visit: Payer: Medicare Other

## 2011-10-06 ENCOUNTER — Other Ambulatory Visit: Payer: Medicare Other | Admitting: Lab

## 2011-10-06 ENCOUNTER — Ambulatory Visit: Payer: Medicare Other | Admitting: Oncology

## 2011-10-07 ENCOUNTER — Telehealth: Payer: Self-pay | Admitting: *Deleted

## 2011-10-07 NOTE — Telephone Encounter (Signed)
Call from pt reporting he was supposed to hear from this office before next cycle of chemo. Reviewed with Dr. Truett Perna. Continue same dose of Cytoxan/ Dex scheduled for 10/08/11. Labs for light chains to be drawn that day. Will call pt with results before next cycle of Velcade. Pt verbalized understanding.

## 2011-10-08 ENCOUNTER — Other Ambulatory Visit (HOSPITAL_BASED_OUTPATIENT_CLINIC_OR_DEPARTMENT_OTHER): Payer: Medicare Other | Admitting: Lab

## 2011-10-08 ENCOUNTER — Other Ambulatory Visit: Payer: Self-pay | Admitting: Oncology

## 2011-10-08 DIAGNOSIS — E8589 Other amyloidosis: Secondary | ICD-10-CM

## 2011-10-08 DIAGNOSIS — C9000 Multiple myeloma not having achieved remission: Secondary | ICD-10-CM

## 2011-10-08 DIAGNOSIS — C61 Malignant neoplasm of prostate: Secondary | ICD-10-CM

## 2011-10-08 DIAGNOSIS — Z5112 Encounter for antineoplastic immunotherapy: Secondary | ICD-10-CM

## 2011-10-08 LAB — CBC WITH DIFFERENTIAL/PLATELET
BASO%: 0.1 % (ref 0.0–2.0)
Basophils Absolute: 0 10*3/uL (ref 0.0–0.1)
EOS%: 1.8 % (ref 0.0–7.0)
Eosinophils Absolute: 0.1 10*3/uL (ref 0.0–0.5)
HCT: 41.8 % (ref 38.4–49.9)
HGB: 14.1 g/dL (ref 13.0–17.1)
LYMPH%: 17.7 % (ref 14.0–49.0)
MCH: 31.9 pg (ref 27.2–33.4)
MCHC: 33.7 g/dL (ref 32.0–36.0)
MCV: 94.4 fL (ref 79.3–98.0)
MONO#: 0.7 10*3/uL (ref 0.1–0.9)
MONO%: 9.3 % (ref 0.0–14.0)
NEUT#: 5.7 10*3/uL (ref 1.5–6.5)
NEUT%: 71.1 % (ref 39.0–75.0)
Platelets: 167 10*3/uL (ref 140–400)
RBC: 4.43 10*6/uL (ref 4.20–5.82)
RDW: 14.7 % — ABNORMAL HIGH (ref 11.0–14.6)
WBC: 8 10*3/uL (ref 4.0–10.3)
lymph#: 1.4 10*3/uL (ref 0.9–3.3)

## 2011-10-12 LAB — BASIC METABOLIC PANEL
BUN: 12 mg/dL (ref 6–23)
CO2: 23 mEq/L (ref 19–32)
Calcium: 9.7 mg/dL (ref 8.4–10.5)
Chloride: 104 mEq/L (ref 96–112)
Creatinine, Ser: 0.86 mg/dL (ref 0.50–1.35)
Glucose, Bld: 101 mg/dL — ABNORMAL HIGH (ref 70–99)
Potassium: 4.1 mEq/L (ref 3.5–5.3)
Sodium: 139 mEq/L (ref 135–145)

## 2011-10-12 LAB — PROTEIN ELECTROPHORESIS, SERUM
Albumin ELP: 58.9 % (ref 55.8–66.1)
Alpha-1-Globulin: 4.9 % (ref 2.9–4.9)
Alpha-2-Globulin: 14.6 % — ABNORMAL HIGH (ref 7.1–11.8)
Beta 2: 4.6 % (ref 3.2–6.5)
Beta Globulin: 7.1 % (ref 4.7–7.2)
Gamma Globulin: 9.9 % — ABNORMAL LOW (ref 11.1–18.8)
Total Protein, Serum Electrophoresis: 6.1 g/dL (ref 6.0–8.3)

## 2011-10-12 LAB — KAPPA/LAMBDA LIGHT CHAINS
Kappa free light chain: 1.28 mg/dL (ref 0.33–1.94)
Kappa:Lambda Ratio: 0.05 — ABNORMAL LOW (ref 0.26–1.65)
Lambda Free Lght Chn: 24.8 mg/dL — ABNORMAL HIGH (ref 0.57–2.63)

## 2011-10-14 ENCOUNTER — Other Ambulatory Visit: Payer: Self-pay | Admitting: Oncology

## 2011-10-15 ENCOUNTER — Other Ambulatory Visit: Payer: Self-pay | Admitting: Oncology

## 2011-10-15 ENCOUNTER — Other Ambulatory Visit (HOSPITAL_BASED_OUTPATIENT_CLINIC_OR_DEPARTMENT_OTHER): Payer: Medicare Other | Admitting: Lab

## 2011-10-15 ENCOUNTER — Ambulatory Visit (HOSPITAL_BASED_OUTPATIENT_CLINIC_OR_DEPARTMENT_OTHER): Payer: Medicare Other

## 2011-10-15 DIAGNOSIS — E8589 Other amyloidosis: Secondary | ICD-10-CM

## 2011-10-15 DIAGNOSIS — Z5112 Encounter for antineoplastic immunotherapy: Secondary | ICD-10-CM

## 2011-10-15 DIAGNOSIS — E8809 Other disorders of plasma-protein metabolism, not elsewhere classified: Secondary | ICD-10-CM

## 2011-10-15 LAB — CBC WITH DIFFERENTIAL/PLATELET
BASO%: 0.4 % (ref 0.0–2.0)
Basophils Absolute: 0 10*3/uL (ref 0.0–0.1)
EOS%: 1.3 % (ref 0.0–7.0)
Eosinophils Absolute: 0.1 10*3/uL (ref 0.0–0.5)
HCT: 40 % (ref 38.4–49.9)
HGB: 13.5 g/dL (ref 13.0–17.1)
LYMPH%: 18.3 % (ref 14.0–49.0)
MCH: 32.2 pg (ref 27.2–33.4)
MCHC: 33.9 g/dL (ref 32.0–36.0)
MCV: 95.1 fL (ref 79.3–98.0)
MONO#: 0.9 10*3/uL (ref 0.1–0.9)
MONO%: 10.5 % (ref 0.0–14.0)
NEUT#: 5.6 10*3/uL (ref 1.5–6.5)
NEUT%: 69.5 % (ref 39.0–75.0)
Platelets: 205 10*3/uL (ref 140–400)
RBC: 4.2 10*6/uL (ref 4.20–5.82)
RDW: 15 % — ABNORMAL HIGH (ref 11.0–14.6)
WBC: 8.1 10*3/uL (ref 4.0–10.3)
lymph#: 1.5 10*3/uL (ref 0.9–3.3)

## 2011-10-15 MED ORDER — BORTEZOMIB CHEMO SQ INJECTION 3.5 MG (2.5MG/ML)
1.3000 mg/m2 | Freq: Once | INTRAMUSCULAR | Status: AC
  Administered 2011-10-15: 2.75 mg via SUBCUTANEOUS
  Filled 2011-10-15: qty 2.75

## 2011-10-15 MED ORDER — ONDANSETRON HCL 8 MG PO TABS
8.0000 mg | ORAL_TABLET | Freq: Once | ORAL | Status: AC
  Administered 2011-10-15: 8 mg via ORAL

## 2011-10-22 ENCOUNTER — Telehealth: Payer: Self-pay | Admitting: Oncology

## 2011-10-22 ENCOUNTER — Other Ambulatory Visit: Payer: Self-pay | Admitting: Oncology

## 2011-10-22 ENCOUNTER — Ambulatory Visit (HOSPITAL_BASED_OUTPATIENT_CLINIC_OR_DEPARTMENT_OTHER): Payer: Medicare Other | Admitting: Oncology

## 2011-10-22 ENCOUNTER — Other Ambulatory Visit (HOSPITAL_BASED_OUTPATIENT_CLINIC_OR_DEPARTMENT_OTHER): Payer: Medicare Other | Admitting: Lab

## 2011-10-22 DIAGNOSIS — C9000 Multiple myeloma not having achieved remission: Secondary | ICD-10-CM

## 2011-10-22 DIAGNOSIS — Z8546 Personal history of malignant neoplasm of prostate: Secondary | ICD-10-CM

## 2011-10-22 DIAGNOSIS — E859 Amyloidosis, unspecified: Secondary | ICD-10-CM

## 2011-10-22 DIAGNOSIS — E8809 Other disorders of plasma-protein metabolism, not elsewhere classified: Secondary | ICD-10-CM

## 2011-10-22 DIAGNOSIS — E8589 Other amyloidosis: Secondary | ICD-10-CM

## 2011-10-22 DIAGNOSIS — D702 Other drug-induced agranulocytosis: Secondary | ICD-10-CM

## 2011-10-22 DIAGNOSIS — C61 Malignant neoplasm of prostate: Secondary | ICD-10-CM

## 2011-10-22 DIAGNOSIS — Z5112 Encounter for antineoplastic immunotherapy: Secondary | ICD-10-CM

## 2011-10-22 LAB — CBC WITH DIFFERENTIAL/PLATELET
BASO%: 0.8 % (ref 0.0–2.0)
Basophils Absolute: 0.1 10*3/uL (ref 0.0–0.1)
EOS%: 1.4 % (ref 0.0–7.0)
Eosinophils Absolute: 0.1 10*3/uL (ref 0.0–0.5)
HCT: 40 % (ref 38.4–49.9)
HGB: 13.6 g/dL (ref 13.0–17.1)
LYMPH%: 19.4 % (ref 14.0–49.0)
MCH: 32.4 pg (ref 27.2–33.4)
MCHC: 34.1 g/dL (ref 32.0–36.0)
MCV: 95 fL (ref 79.3–98.0)
MONO#: 1 10*3/uL — ABNORMAL HIGH (ref 0.1–0.9)
MONO%: 12.2 % (ref 0.0–14.0)
NEUT#: 5.6 10*3/uL (ref 1.5–6.5)
NEUT%: 66.2 % (ref 39.0–75.0)
Platelets: 186 10*3/uL (ref 140–400)
RBC: 4.21 10*6/uL (ref 4.20–5.82)
RDW: 14.7 % — ABNORMAL HIGH (ref 11.0–14.6)
WBC: 8.5 10*3/uL (ref 4.0–10.3)
lymph#: 1.6 10*3/uL (ref 0.9–3.3)

## 2011-10-22 NOTE — Telephone Encounter (Signed)
Pt was here , gave him calendar, tried to schedule the Velcade with MD visit, pt informed me that his chemo has been discontinued per Md discussion.

## 2011-10-22 NOTE — Progress Notes (Signed)
OFFICE PROGRESS NOTE   INTERVAL HISTORY:   Brent Allison returns as scheduled. He began another cycle of Velcade on November 23. He reports one day of dyspepsia last week. This has resolved. He denies mouth sores and dysuria. He has noted increased numbness in the fingers. This now interferes with activities such as buttoning his shirt and holding objects. He reports mild leg weakness.  Objective:  Vital signs in last 24 hours:  Blood pressure 124/70, pulse 75, temperature 97.2 F (36.2 C), temperature source Oral, weight 199 lb 4.8 oz (90.402 kg).    HEENT: No thrush or ulcers Resp: Lungs clear bilateral Cardio: Regular rate and rhythm GI: Abdomen nontender. No hepatosplenic Vascular: No leg edema Neuro: There is decreased sensation with light touch and pinprick at the fingertip bilaterally. There is a moderate to severe decrease in vibratory sense at the fingertips  Skin: Small ecchymoses at the left greater than right eyelid    Lab Results:  CBC  Lab Results  Component Value Date   WBC 8.5 10/22/2011   HGB 13.6 10/22/2011   HCT 40.0 10/22/2011   MCV 95.0 10/22/2011   PLT 186 10/22/2011    Medications: I have reviewed the patient's current medications.  Assessment/Plan: 1. 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 2. "Bruising" at the eyelids and mouth:  Likely related to amyloidosis, persistent. 3. Numbness and loss of vibratory sense at the fingertips:  This predated Velcade-based therapy, but has worsened.  I suspect the progressive numbness is related to Velcade. 4. Serum monoclonal lambda light chains 5. Elevated serum free lambda light chains. The lambda light chains were lower on November 16. 6. Lambda light chain proteinuria. 7. Bone marrow plasmacytosis:  Variable increase in plasma cells noted on the bone marrow biopsy 07/29/2011 with plasma cells estimated to represent between 4% and 20% of the cellular population. 8. Remote history of prostate  cancer. 9. Sleep apnea. 10. Dyslipidemia. 11. Report of pneumonia on 2 occasions in 2011. 12. Admission to a hospital in [place], [place] October 2012 with "pneumonia." A chest x-ray at [facility] on November 2 was negative the 13. Low serum immunoglobulin G level. 14. Borderline-low hemoglobin level, improved. 15. Plasma cell dyscrasia with associated amyloidosis.  a. Initiation of systemic therapy with Cytoxan, Velcade, and Decadron 08/12/2011.  Cycle #2 was initiated on 09/16/2011. Cycle #3 was initiated on 10/15/2011.   Disposition:  He is completing a third cycle of systemic therapy with Velcade, Cytoxan, and Decadron. He has developed progressive neuropathy. This now interferes with activity. I discussed treatment options with the patient and his wife. We decided to continue weekly Cytoxan/Decadron. The Velcade will be placed on hold. He will return for an office visit and repeat serum light chain analysis in 3 weeks. We will consider resuming Velcade if the neuropathy symptoms improved.   Brent Shutters, MD  10/22/2011  2:08 PM

## 2011-10-27 ENCOUNTER — Other Ambulatory Visit: Payer: Self-pay | Admitting: *Deleted

## 2011-10-27 MED ORDER — DEXAMETHASONE 4 MG PO TABS: 40.0000 mg | ORAL_TABLET | ORAL | Status: DC

## 2011-10-27 NOTE — Telephone Encounter (Signed)
Needs refill on weekly po decadron.

## 2011-11-01 ENCOUNTER — Other Ambulatory Visit: Payer: Self-pay | Admitting: *Deleted

## 2011-11-01 MED ORDER — DEXAMETHASONE 4 MG PO TABS: 40.0000 mg | ORAL_TABLET | ORAL | Status: AC

## 2011-11-01 NOTE — Telephone Encounter (Signed)
Call from pt reporting his last refill was only for one week supply. Refill sent electronically to pharmacy. Returned call to pt, made him aware.

## 2011-11-08 ENCOUNTER — Other Ambulatory Visit: Payer: Self-pay | Admitting: *Deleted

## 2011-11-08 NOTE — Telephone Encounter (Signed)
THIS REQUEST WAS GIVEN TO DR.SHERRILL'S NURSE, SUSAN COWARD,RN. 

## 2011-11-10 ENCOUNTER — Other Ambulatory Visit: Payer: Self-pay | Admitting: *Deleted

## 2011-11-10 MED ORDER — CYCLOPHOSPHAMIDE 50 MG PO TABS: 600.0000 mg | ORAL_TABLET | ORAL | Status: DC

## 2011-11-10 NOTE — Telephone Encounter (Signed)
[facility] faxed confirmation of prescription facsimile receipt for cytoxan.  [facility] will verify insurance information and contact patient for delivery.

## 2011-11-12 ENCOUNTER — Other Ambulatory Visit (HOSPITAL_BASED_OUTPATIENT_CLINIC_OR_DEPARTMENT_OTHER): Payer: Medicare Other | Admitting: Lab

## 2011-11-12 ENCOUNTER — Telehealth: Payer: Self-pay | Admitting: Oncology

## 2011-11-12 ENCOUNTER — Ambulatory Visit (HOSPITAL_BASED_OUTPATIENT_CLINIC_OR_DEPARTMENT_OTHER): Payer: Medicare Other | Admitting: Oncology

## 2011-11-12 ENCOUNTER — Ambulatory Visit: Payer: Medicare Other

## 2011-11-12 DIAGNOSIS — C9000 Multiple myeloma not having achieved remission: Secondary | ICD-10-CM

## 2011-11-12 DIAGNOSIS — R209 Unspecified disturbances of skin sensation: Secondary | ICD-10-CM

## 2011-11-12 DIAGNOSIS — G589 Mononeuropathy, unspecified: Secondary | ICD-10-CM

## 2011-11-12 DIAGNOSIS — Z8546 Personal history of malignant neoplasm of prostate: Secondary | ICD-10-CM

## 2011-11-12 LAB — CBC WITH DIFFERENTIAL/PLATELET
BASO%: 0.4 % (ref 0.0–2.0)
Basophils Absolute: 0 10*3/uL (ref 0.0–0.1)
EOS%: 1.6 % (ref 0.0–7.0)
Eosinophils Absolute: 0.1 10*3/uL (ref 0.0–0.5)
HCT: 42.4 % (ref 38.4–49.9)
HGB: 14.5 g/dL (ref 13.0–17.1)
LYMPH%: 17.1 % (ref 14.0–49.0)
MCH: 32.6 pg (ref 27.2–33.4)
MCHC: 34.3 g/dL (ref 32.0–36.0)
MCV: 95.1 fL (ref 79.3–98.0)
MONO#: 0.7 10*3/uL (ref 0.1–0.9)
MONO%: 9.8 % (ref 0.0–14.0)
NEUT#: 4.9 10*3/uL (ref 1.5–6.5)
NEUT%: 71.1 % (ref 39.0–75.0)
Platelets: 186 10*3/uL (ref 140–400)
RBC: 4.46 10*6/uL (ref 4.20–5.82)
RDW: 15.8 % — ABNORMAL HIGH (ref 11.0–14.6)
WBC: 6.9 10*3/uL (ref 4.0–10.3)
lymph#: 1.2 10*3/uL (ref 0.9–3.3)

## 2011-11-12 NOTE — Telephone Encounter (Signed)
gve the pt his jan 2013 appt calendar °

## 2011-11-12 NOTE — Progress Notes (Signed)
OFFICE PROGRESS NOTE   INTERVAL HISTORY:  He is scheduled. He continues weekly Cytoxan and Decadron. The neuropathy symptoms have not improved. He continues to have numbness at the fingers. He does not have significant numbness at the feet. He reports occasional "dizziness ". He complains of pruritus at the eyes.   Objecti and prognosis with himve:  Vital signs in last 24 hours:  Blood pressure 115/55, pulse 85, temperature 98.9 F (37.2 C), temperature source Oral, weight 200 lb (90.719 kg).    HEENT: No thrush or ulcers Resp: Lungs clear bilaterally Cardio: Regular rate and rhythm GI: Abdomen nontender. No  hepatosplenomegaly  Vascular: No leg edema Neuro:  there is a moderate to severe decrease in vibratory sense at the fingertip bilaterally  Skin: Small ecchymoses at the  eyelids     Lab Results:  CBC  Lab Results  Component Value Date   WBC 6.9 11/12/2011   HGB 14.5 11/12/2011   HCT 42.4 11/12/2011   MCV 95.1 11/12/2011   PLT 186 11/12/2011    Medications: I have reviewed the patient's current medications.  Assessment/Plan: 1. 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 2. "Bruising" at the eyelids and mouth:  Likely related to amyloidosis, persistent. 3. Numbness and loss of vibratory sense at the fingertips:  This predated Velcade-based therapy, but has worsened.  I suspect the progressive numbness is related to Velcade. 4. Serum monoclonal lambda light chains 5. Elevated serum free lambda light chains. The lambda light chains were lower on November 16. 6. Lambda light chain proteinuria. 7. Bone marrow plasmacytosis:  Variable increase in plasma cells noted on the bone marrow biopsy 07/29/2011 with plasma cells estimated to represent between 4% and 20% of the cellular population. 8. Remote history of prostate cancer. 9. Sleep apnea. 10. Dyslipidemia. 11. Report of pneumonia on 2 occasions in 2011. 12. Admission to a hospital in [place], [place]  October 2012 with "pneumonia." A chest x-ray at [facility] on November 2 was negative .  13. Low serum immunoglobulin G level. 14. Borderline-low hemoglobin level, improved. 15. Plasma cell dyscrasia with associated amyloidosis.  a. Initiation of systemic therapy with Cytoxan, Velcade, and Decadron 08/12/2011.  Cycle #2 was initiated on 09/16/2011. Cycle #3 was initiated on 10/15/2011.   Disposition:  He continues weekly Cytoxan/Decadron. Velcade remains on hold. The neuropathy symptoms are unchanged. We will followup on the serum light chains from today. He will return for an office visit in 3 weeks    Lucile Shutters, MD  11/12/2011  9:55 PM    And a

## 2011-11-17 LAB — COMPREHENSIVE METABOLIC PANEL
ALT: 15 U/L (ref 0–53)
AST: 16 U/L (ref 0–37)
Albumin: 3.9 g/dL (ref 3.5–5.2)
Alkaline Phosphatase: 73 U/L (ref 39–117)
BUN: 13 mg/dL (ref 6–23)
CO2: 28 mEq/L (ref 19–32)
Calcium: 10 mg/dL (ref 8.4–10.5)
Chloride: 104 mEq/L (ref 96–112)
Creatinine, Ser: 0.86 mg/dL (ref 0.50–1.35)
Glucose, Bld: 83 mg/dL (ref 70–99)
Potassium: 4.3 mEq/L (ref 3.5–5.3)
Sodium: 142 mEq/L (ref 135–145)
Total Bilirubin: 0.5 mg/dL (ref 0.3–1.2)
Total Protein: 6.1 g/dL (ref 6.0–8.3)

## 2011-11-17 LAB — KAPPA/LAMBDA LIGHT CHAINS
Kappa free light chain: 0.24 mg/dL — ABNORMAL LOW (ref 0.33–1.94)
Kappa:Lambda Ratio: 0.01 — ABNORMAL LOW (ref 0.26–1.65)
Lambda Free Lght Chn: 16.5 mg/dL — ABNORMAL HIGH (ref 0.57–2.63)

## 2011-11-22 ENCOUNTER — Telehealth: Payer: Self-pay | Admitting: *Deleted

## 2011-11-22 NOTE — Telephone Encounter (Signed)
Message copied by Wandalee Ferdinand on Mon Nov 22, 2011  3:36 PM ------      Message from: [name] B      Created: Wed Nov 17, 2011  8:31 PM       Please call patient, light chains are better , continue cytoxan/decadron

## 2011-11-24 NOTE — Telephone Encounter (Signed)
Patient made aware of improvement in light chains and to continue current treatment.

## 2011-11-29 ENCOUNTER — Telehealth: Payer: Self-pay | Admitting: *Deleted

## 2011-11-29 NOTE — Telephone Encounter (Signed)
Patient reports sinus and chest congestion. No fever or dyspnea. OK to use OTC meds?  Per Dr. Truett Perna : As long as afebrile and no dyspnea, try OTC symptom relief, decongestants, push fluids. Call asap for fever or dyspnea due to risk for pneumonia.  Patient understands and agrees.

## 2011-12-03 ENCOUNTER — Other Ambulatory Visit (HOSPITAL_BASED_OUTPATIENT_CLINIC_OR_DEPARTMENT_OTHER): Payer: Medicare Other | Admitting: Lab

## 2011-12-03 ENCOUNTER — Ambulatory Visit (HOSPITAL_BASED_OUTPATIENT_CLINIC_OR_DEPARTMENT_OTHER): Payer: Medicare Other | Admitting: Oncology

## 2011-12-03 ENCOUNTER — Other Ambulatory Visit: Payer: Self-pay | Admitting: Oncology

## 2011-12-03 ENCOUNTER — Telehealth: Payer: Self-pay | Admitting: Oncology

## 2011-12-03 DIAGNOSIS — E859 Amyloidosis, unspecified: Secondary | ICD-10-CM

## 2011-12-03 DIAGNOSIS — C9000 Multiple myeloma not having achieved remission: Secondary | ICD-10-CM

## 2011-12-03 DIAGNOSIS — G589 Mononeuropathy, unspecified: Secondary | ICD-10-CM

## 2011-12-03 DIAGNOSIS — Z79899 Other long term (current) drug therapy: Secondary | ICD-10-CM

## 2011-12-03 LAB — CBC WITH DIFFERENTIAL/PLATELET
BASO%: 0.2 % (ref 0.0–2.0)
Basophils Absolute: 0 10*3/uL (ref 0.0–0.1)
EOS%: 3.8 % (ref 0.0–7.0)
Eosinophils Absolute: 0.3 10*3/uL (ref 0.0–0.5)
HCT: 40.1 % (ref 38.4–49.9)
HGB: 13.7 g/dL (ref 13.0–17.1)
LYMPH%: 15.4 % (ref 14.0–49.0)
MCH: 32.8 pg (ref 27.2–33.4)
MCHC: 34.1 g/dL (ref 32.0–36.0)
MCV: 96.4 fL (ref 79.3–98.0)
MONO#: 0.7 10*3/uL (ref 0.1–0.9)
MONO%: 9.9 % (ref 0.0–14.0)
NEUT#: 5 10*3/uL (ref 1.5–6.5)
NEUT%: 70.7 % (ref 39.0–75.0)
Platelets: 185 10*3/uL (ref 140–400)
RBC: 4.16 10*6/uL — ABNORMAL LOW (ref 4.20–5.82)
RDW: 15.9 % — ABNORMAL HIGH (ref 11.0–14.6)
WBC: 7.1 10*3/uL (ref 4.0–10.3)
lymph#: 1.1 10*3/uL (ref 0.9–3.3)

## 2011-12-03 NOTE — Telephone Encounter (Signed)
lmonvm for diane @ [facility] re appt for pt. Pt was given appt schedule for jan/feb b4 leaving today. Sent message to tiffany in HIM re referral for onc.

## 2011-12-03 NOTE — Telephone Encounter (Signed)
gv pt appt schedule for jan/feb. Per pt his chemo is an oral med he does not have any infusions at the center.

## 2011-12-03 NOTE — Progress Notes (Signed)
OFFICE PROGRESS NOTE   INTERVAL HISTORY:   He returns as scheduled. He continues weekly Cytoxan/Decadron. He denies mouth sores and nausea. He notices and increased energy level on the day after taking Decadron. He continues to have numbness in the fingers. This interferes with activity. He now feels "tingling "at the forearms. He denies arm and hand weakness. He continues to have a "wobbly feeling "in his legs. He denies foot numbness. He reports chest congestion and a cough last week. No fever. Mild sinus congestion.  Objective:  Vital signs in last 24 hours:  Blood pressure 120/72, pulse 85, temperature 98.5 F (36.9 C), temperature source Oral, height 6\' 1"  (1.854 m), weight 196 lb 11.2 oz (89.223 kg).    HEENT: The tongue is enlarged. There are ecchymoses over the tongue and buccal mucosa. No ulcers. There is an ecchymosis at the left eyelid. Resp: Lungs clear bilaterally Cardio: Regular rate and rhythm GI: No hepatosplenomegaly Vascular: No leg edema Neuro: The motor strength is intact in the arms and hands bilaterally. 5 over 5 strength with dorsi flexion at the feet. There is a moderate to severe decrease in vibratory sense at the fingertip bilaterally      Lab Results:  Lab Results  Component Value Date   WBC 7.1 12/03/2011   HGB 13.7 12/03/2011   HCT 40.1 12/03/2011   MCV 96.4 12/03/2011   PLT 185 12/03/2011   ANC 5.0  Serum free lambda light chains 16.5 on 11/12/2011, 24.8 on 10/08/2011, 50.4 on 07/05/2011   Medications: I have reviewed the patient's current medications.  Assessment/Plan: 1. 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 2. "Bruising" at the eyelids and mouth: Likely related to amyloidosis, persistent. 3. Numbness and loss of vibratory sense at the fingertips: This predated Velcade-based therapy, but has worsened.Serum monoclonal lambda light chains 4. Elevated serum free lambda light chains. The lambda light chains were lower on November  16. 5. Lambda light chain proteinuria. 6. Bone marrow plasmacytosis: Variable increase in plasma cells noted on the bone marrow biopsy 07/29/2011 with plasma cells estimated to represent between 4% and 20% of the cellular population. 7. Remote history of prostate cancer. 8. Sleep apnea. 9. Dyslipidemia. 10. Report of pneumonia on 2 occasions in 2011. 11. Admission to a hospital in [place], [place] October 2012 with "pneumonia." A chest x-ray at [facility] on November 2 was negative .  12. Low serum immunoglobulin G level. 13. Borderline-low hemoglobin level, improved. 14. Plasma cell dyscrasia with associated amyloidosis.  a. Initiation of systemic therapy with Cytoxan, Velcade, and Decadron 08/12/2011. Cycle #2 was initiated on 09/16/2011. Cycle #3 was initiated on 10/15/2011. Velcade was placed on hold due to 2 neuropathy. He continues weekly Cytoxan/Decadron b. The serum free lambda light chains were decreased on October 08 2011-7/21 2012.      Disposition:  He has increasing neuropathy symptoms. The Velcade was discontinued and the neuropathy symptoms have not improved.  The neuropathy is likely related to amyloidosis. We will make a were referral to neurology for additional diagnostic evaluation.  The serum light chains have decreased partially while on the Cytoxan/Decadron regimen, but it is not clear to me that he is achieving a clinical benefit. We decided to continue the Cytoxan/Decadron for now. We will make a referral to Dr. Leeanne Deed to consider the indication for resuming Velcade or switching to a different systemic therapy regimen.  Mr. Mota will return for a lab visit to include a serum light chain analysis and vitamin B 12 level  in 2 weeks. He is scheduled for an office visit on 12/29/2011.   Lucile Shutters, MD  12/03/2011  11:24 AM

## 2011-12-06 ENCOUNTER — Other Ambulatory Visit: Payer: Self-pay | Admitting: *Deleted

## 2011-12-06 NOTE — Telephone Encounter (Signed)
THIS REQUEST WAS GIVEN TO DR.SHERRILL'S NURSE, SUSAN COWARD,RN. 

## 2011-12-07 ENCOUNTER — Telehealth: Payer: Self-pay | Admitting: Oncology

## 2011-12-07 ENCOUNTER — Other Ambulatory Visit: Payer: Self-pay | Admitting: *Deleted

## 2011-12-07 ENCOUNTER — Encounter: Payer: Self-pay | Admitting: Medical Oncology

## 2011-12-07 MED ORDER — CYCLOPHOSPHAMIDE 50 MG PO TABS: 600.0000 mg | ORAL_TABLET | ORAL | Status: DC

## 2011-12-07 NOTE — Progress Notes (Signed)
Received a confirmation from [facility] they received cytoxan presription

## 2011-12-07 NOTE — Telephone Encounter (Signed)
Completed referral returned by nurse and sent to mri w/fax coversheet to send referral along w/notes to diane @ [facility] [phone]). Diane will call pt w/appt.

## 2011-12-07 NOTE — Telephone Encounter (Signed)
Pt appt. With Dr. Leeanne Deed @ [facility] is 12/21/11 @ 1:00. Faxed medical records. Slides and scans will be fedex'ed. Pt is aware.

## 2011-12-07 NOTE — Telephone Encounter (Signed)
Neurology referral sent to nurse today to complete for appt. lmonvm for Brent Allison @ [facility] requesting appt. Brent Allison returned call and lm that we still need to complete referral and onced received they will call pt w/appt.

## 2011-12-13 ENCOUNTER — Telehealth: Payer: Self-pay | Admitting: Oncology

## 2011-12-13 ENCOUNTER — Telehealth: Payer: Self-pay | Admitting: *Deleted

## 2011-12-13 ENCOUNTER — Other Ambulatory Visit: Payer: Self-pay | Admitting: *Deleted

## 2011-12-13 NOTE — Telephone Encounter (Signed)
Per Dr. Butch Penny 1/25 labs to 1/22 if patient agrees. Mr. Mckowen agrees to labs on 1/20. Scheduler notified. He also has not heard from anyone regarding his neurology referral.

## 2011-12-13 NOTE — Telephone Encounter (Signed)
called pts and r/s appt for 01/25 to 01/22.  also called Dr. Anne Hahn office and they stated that they called pt to scheduled appt on 01/16.  rtn call to pt and informed him of info with a phone number to rtn to them to schedule appt

## 2011-12-13 NOTE — Telephone Encounter (Signed)
Cytoxan/Decadron every Friday. This past treatment seemed to "hit me hard". Feels very weak. Denies any dizziness or dyspnea. Scheduled for labs on 12/17/11 and to see Dr. Leeanne Deed on 12/21/11.

## 2011-12-14 ENCOUNTER — Other Ambulatory Visit: Payer: Medicare Other | Admitting: Lab

## 2011-12-14 LAB — CBC WITH DIFFERENTIAL/PLATELET
BASO%: 0.3 % (ref 0.0–2.0)
Basophils Absolute: 0 10*3/uL (ref 0.0–0.1)
EOS%: 4.6 % (ref 0.0–7.0)
Eosinophils Absolute: 0.3 10*3/uL (ref 0.0–0.5)
HCT: 40.5 % (ref 38.4–49.9)
HGB: 13.9 g/dL (ref 13.0–17.1)
LYMPH%: 13.8 % — ABNORMAL LOW (ref 14.0–49.0)
MCH: 33.1 pg (ref 27.2–33.4)
MCHC: 34.3 g/dL (ref 32.0–36.0)
MCV: 96.4 fL (ref 79.3–98.0)
MONO#: 0.6 10*3/uL (ref 0.1–0.9)
MONO%: 9.6 % (ref 0.0–14.0)
NEUT#: 4.8 10*3/uL (ref 1.5–6.5)
NEUT%: 71.7 % (ref 39.0–75.0)
Platelets: 193 10*3/uL (ref 140–400)
RBC: 4.2 10*6/uL (ref 4.20–5.82)
RDW: 15.4 % — ABNORMAL HIGH (ref 11.0–14.6)
WBC: 6.7 10*3/uL (ref 4.0–10.3)
lymph#: 0.9 10*3/uL (ref 0.9–3.3)

## 2011-12-15 LAB — KAPPA/LAMBDA LIGHT CHAINS
Kappa free light chain: 0.65 mg/dL (ref 0.33–1.94)
Kappa:Lambda Ratio: 0.03 — ABNORMAL LOW (ref 0.26–1.65)
Lambda Free Lght Chn: 23.8 mg/dL — ABNORMAL HIGH (ref 0.57–2.63)

## 2011-12-15 LAB — BASIC METABOLIC PANEL
BUN: 13 mg/dL (ref 6–23)
CO2: 25 mEq/L (ref 19–32)
Calcium: 9 mg/dL (ref 8.4–10.5)
Chloride: 104 mEq/L (ref 96–112)
Creatinine, Ser: 0.87 mg/dL (ref 0.50–1.35)
Glucose, Bld: 121 mg/dL — ABNORMAL HIGH (ref 70–99)
Potassium: 4 mEq/L (ref 3.5–5.3)
Sodium: 140 mEq/L (ref 135–145)

## 2011-12-15 LAB — VITAMIN B12: Vitamin B-12: 507 pg/mL (ref 211–911)

## 2011-12-16 ENCOUNTER — Other Ambulatory Visit: Payer: Self-pay | Admitting: *Deleted

## 2011-12-16 MED ORDER — ACYCLOVIR 400 MG PO TABS: 400.0000 mg | ORAL_TABLET | Freq: Two times a day (BID) | ORAL | Status: DC

## 2011-12-16 NOTE — Telephone Encounter (Signed)
Call from pt reporting his pharmacy has not received a response for his Acyclovir refill. Rx sent electronically. Pt made aware.

## 2011-12-17 ENCOUNTER — Other Ambulatory Visit: Payer: Medicare Other | Admitting: Lab

## 2011-12-25 ENCOUNTER — Telehealth: Payer: Self-pay | Admitting: Oncology

## 2011-12-25 NOTE — Telephone Encounter (Signed)
Per confirmation notice from [facility] pt is scheduled for 01/31/12 @ 2 pm to see dr Anne Hahn. Per notice pt aware.

## 2011-12-29 ENCOUNTER — Telehealth: Payer: Self-pay | Admitting: Oncology

## 2011-12-29 ENCOUNTER — Ambulatory Visit (HOSPITAL_BASED_OUTPATIENT_CLINIC_OR_DEPARTMENT_OTHER): Payer: Medicare Other | Admitting: Oncology

## 2011-12-29 ENCOUNTER — Ambulatory Visit: Payer: Medicare Other | Admitting: Lab

## 2011-12-29 DIAGNOSIS — T451X5A Adverse effect of antineoplastic and immunosuppressive drugs, initial encounter: Secondary | ICD-10-CM

## 2011-12-29 DIAGNOSIS — R209 Unspecified disturbances of skin sensation: Secondary | ICD-10-CM

## 2011-12-29 DIAGNOSIS — C9000 Multiple myeloma not having achieved remission: Secondary | ICD-10-CM

## 2011-12-29 DIAGNOSIS — E785 Hyperlipidemia, unspecified: Secondary | ICD-10-CM

## 2011-12-29 LAB — CBC WITH DIFFERENTIAL/PLATELET
BASO%: 0.3 % (ref 0.0–2.0)
Basophils Absolute: 0 10*3/uL (ref 0.0–0.1)
EOS%: 4.9 % (ref 0.0–7.0)
Eosinophils Absolute: 0.4 10*3/uL (ref 0.0–0.5)
HCT: 39.3 % (ref 38.4–49.9)
HGB: 13.4 g/dL (ref 13.0–17.1)
LYMPH%: 14.7 % (ref 14.0–49.0)
MCH: 32.5 pg (ref 27.2–33.4)
MCHC: 34.1 g/dL (ref 32.0–36.0)
MCV: 95.6 fL (ref 79.3–98.0)
MONO#: 0.8 10*3/uL (ref 0.1–0.9)
MONO%: 10.9 % (ref 0.0–14.0)
NEUT#: 5.1 10*3/uL (ref 1.5–6.5)
NEUT%: 69.2 % (ref 39.0–75.0)
Platelets: 201 10*3/uL (ref 140–400)
RBC: 4.12 10*6/uL — ABNORMAL LOW (ref 4.20–5.82)
RDW: 14.8 % — ABNORMAL HIGH (ref 11.0–14.6)
WBC: 7.4 10*3/uL (ref 4.0–10.3)
lymph#: 1.1 10*3/uL (ref 0.9–3.3)

## 2011-12-29 MED ORDER — LENALIDOMIDE 15 MG PO CAPS: 15.0000 mg | ORAL_CAPSULE | Freq: Every day | ORAL | Status: DC

## 2011-12-29 NOTE — Telephone Encounter (Signed)
Gv pt appt for march2013 °

## 2011-12-29 NOTE — Progress Notes (Signed)
OFFICE PROGRESS NOTE   INTERVAL HISTORY:   He continues weekly Cytoxan and Decadron. He reports tolerating the chemotherapy well aside from constipation. The constipation is relieved with a stool softener. He reports progression of the neuropathy symptoms. It is now difficult to perform activities such as buttoning his shirt.  He saw Dr.Tuchman last week. They discussed treatment with Revlimid and stem cell therapy.  Objective:  Vital signs in last 24 hours:  Blood pressure 116/69, pulse 76, temperature 97.1 F (36.2 C), temperature source Oral, height 6\' 1"  (1.854 m), weight 201 lb 3.2 oz (91.264 kg).    HEENT: No thrush or ulcers. Left eyelid ecchymosis. Resp: Lungs clear bilaterally Cardio: Regular rate and rhythm GI: No hepatosplenomegaly, nontender Vascular: No leg edema Neuro: Moderate decrease in vibratory sense at the fingers and toes     Lab Results:  Lab Results  Component Value Date   WBC 7.4 12/29/2011   HGB 13.4 12/29/2011   HCT 39.3 12/29/2011   MCV 95.6 12/29/2011   PLT 201 12/29/2011   ANC 5.1  Serum free lambda light chain 23.8 on 12/14/2011 Medications: I have reviewed the patient's current medications.  Assessment/Plan: 1. 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 2. "Bruising" at the eyelids and mouth: Likely related to amyloidosis, persistent. 3. Numbness and loss of vibratory sense at the fingertips: This predated Velcade-based therapy, but has worsened. 4. Elevated serum free lambda light chains. The lambda light chains were lower on November 16 and slightly higher on 12/14/2011. 5. Lambda light chain proteinuria. 6. Bone marrow plasmacytosis: Variable increase in plasma cells noted on the bone marrow biopsy 07/29/2011 with plasma cells estimated to represent between 4% and 20% of the cellular population. 7. Remote history of prostate cancer. 8. Sleep apnea. 9. Dyslipidemia. 10. Report of pneumonia on 2 occasions in 2011. 11. Admission to a hospital  in [place], [place] October 2012 with "pneumonia." A chest x-ray at [facility] on November 2 was negative .  12. Low serum immunoglobulin G level. 13. Borderline-low hemoglobin level, improved. 14. Plasma cell dyscrasia with associated amyloidosis.  a. Initiation of systemic therapy with Cytoxan, Velcade, and Decadron 08/12/2011. Cycle #2 was initiated on 09/16/2011. Cycle #3 was initiated on 10/15/2011. Velcade was placed on hold due to 2 neuropathy. He continues weekly Cytoxan/Decadron b. The serum free lambda light chains were decreased on October 08 2011-7/21 2012. c. The serum free lambda light chains were slightly increased on 12/14/2011.      15. Loss of "balance "-? Related to neuropathy  Disposition:  He has progressive neuropathy symptoms despite being off of Velcade. I suspect the amyloid is progressing. I discussed the case with Dr. Leeanne Deed. We decided to make a change to Revlimid/Decadron.  I reviewed the potential toxicities associated with this regimen including the chance for hematologic toxicity, neuropathy, GI upset, and thromboembolic disease. He agrees to begin Revlimid/Decadron. He will take a daily aspirin.  Mr. Reppucci will return for an office visit and repeat serum light chain analysis after the first cycle of Revlimid.  He is scheduled to see Dr. Anne Hahn for a neurology evaluation in March. We will ask Dr. Anne Hahn to see him sooner given the progressive neuropathy symptoms.   Lucile Shutters, MD  12/29/2011  6:59 PM

## 2011-12-31 LAB — COMPREHENSIVE METABOLIC PANEL
ALT: 13 U/L (ref 0–53)
AST: 16 U/L (ref 0–37)
Albumin: 3.7 g/dL (ref 3.5–5.2)
Alkaline Phosphatase: 61 U/L (ref 39–117)
BUN: 15 mg/dL (ref 6–23)
CO2: 24 mEq/L (ref 19–32)
Calcium: 9.4 mg/dL (ref 8.4–10.5)
Chloride: 104 mEq/L (ref 96–112)
Creatinine, Ser: 0.81 mg/dL (ref 0.50–1.35)
Glucose, Bld: 90 mg/dL (ref 70–99)
Potassium: 4.2 mEq/L (ref 3.5–5.3)
Sodium: 138 mEq/L (ref 135–145)
Total Bilirubin: 0.4 mg/dL (ref 0.3–1.2)
Total Protein: 5.9 g/dL — ABNORMAL LOW (ref 6.0–8.3)

## 2011-12-31 LAB — PROTEIN ELECTROPHORESIS, SERUM
Albumin ELP: 57.1 % (ref 55.8–66.1)
Alpha-1-Globulin: 5.8 % — ABNORMAL HIGH (ref 2.9–4.9)
Alpha-2-Globulin: 15.3 % — ABNORMAL HIGH (ref 7.1–11.8)
Beta 2: 5 % (ref 3.2–6.5)
Beta Globulin: 6.8 % (ref 4.7–7.2)
Gamma Globulin: 10 % — ABNORMAL LOW (ref 11.1–18.8)
Total Protein, Serum Electrophoresis: 5.9 g/dL — ABNORMAL LOW (ref 6.0–8.3)

## 2011-12-31 LAB — KAPPA/LAMBDA LIGHT CHAINS
Kappa free light chain: 0.2 mg/dL — ABNORMAL LOW (ref 0.33–1.94)
Kappa:Lambda Ratio: 0.01 — ABNORMAL LOW (ref 0.26–1.65)
Lambda Free Lght Chn: 20.4 mg/dL — ABNORMAL HIGH (ref 0.57–2.63)

## 2012-01-10 ENCOUNTER — Other Ambulatory Visit: Payer: Self-pay | Admitting: *Deleted

## 2012-01-10 MED ORDER — DEXAMETHASONE 4 MG PO TABS: 40.0000 mg | ORAL_TABLET | ORAL | Status: DC

## 2012-01-12 ENCOUNTER — Encounter: Payer: Self-pay | Admitting: Oncology

## 2012-01-12 NOTE — Progress Notes (Signed)
Called [facility] [id number] for revlimid 15mg  prior auth; approved until 07/11/12 # [id number].

## 2012-01-14 ENCOUNTER — Other Ambulatory Visit: Payer: Self-pay | Admitting: *Deleted

## 2012-01-14 ENCOUNTER — Telehealth: Payer: Self-pay | Admitting: Oncology

## 2012-01-14 NOTE — Telephone Encounter (Signed)
called pts home lmovm that his appt on 03/12 was r/s to 03/13 and to rtn call to confirm changes

## 2012-01-19 ENCOUNTER — Encounter: Payer: Self-pay | Admitting: *Deleted

## 2012-01-19 NOTE — Progress Notes (Signed)
[facility] faxed Revlimid refill request.  Request to MD for review.  

## 2012-01-20 ENCOUNTER — Encounter: Payer: Self-pay | Admitting: *Deleted

## 2012-01-20 ENCOUNTER — Other Ambulatory Visit: Payer: Self-pay

## 2012-01-20 MED ORDER — LENALIDOMIDE 15 MG PO CAPS: 15.0000 mg | ORAL_CAPSULE | Freq: Every day | ORAL | Status: DC

## 2012-01-20 NOTE — Progress Notes (Signed)
RECEIVED A FAX FROM [facility] CONCERNING A CONFIRMATION OF FACSIMILE RECEIPT. 

## 2012-02-01 ENCOUNTER — Ambulatory Visit: Payer: Medicare Other | Admitting: Oncology

## 2012-02-01 ENCOUNTER — Other Ambulatory Visit: Payer: Medicare Other | Admitting: Lab

## 2012-02-02 ENCOUNTER — Ambulatory Visit (HOSPITAL_BASED_OUTPATIENT_CLINIC_OR_DEPARTMENT_OTHER): Payer: Medicare Other | Admitting: Oncology

## 2012-02-02 ENCOUNTER — Other Ambulatory Visit (HOSPITAL_BASED_OUTPATIENT_CLINIC_OR_DEPARTMENT_OTHER): Payer: Medicare Other

## 2012-02-02 ENCOUNTER — Telehealth: Payer: Self-pay | Admitting: Oncology

## 2012-02-02 ENCOUNTER — Ambulatory Visit: Payer: Medicare Other

## 2012-02-02 ENCOUNTER — Other Ambulatory Visit: Payer: Self-pay | Admitting: Pharmacist

## 2012-02-02 ENCOUNTER — Ambulatory Visit (HOSPITAL_COMMUNITY)
Admission: RE | Admit: 2012-02-02 | Discharge: 2012-02-02 | Disposition: A | Discharge status: Home / Self Care | Payer: Medicare Other | Source: Ambulatory Visit | Attending: Oncology | Admitting: Oncology

## 2012-02-02 VITALS — BP 117/66 | HR 89 | Temp 97.1°F | Ht 73.0 in | Wt 204.8 lb

## 2012-02-02 DIAGNOSIS — M7989 Other specified soft tissue disorders: Secondary | ICD-10-CM

## 2012-02-02 DIAGNOSIS — I82409 Acute embolism and thrombosis of unspecified deep veins of unspecified lower extremity: Secondary | ICD-10-CM

## 2012-02-02 DIAGNOSIS — C61 Malignant neoplasm of prostate: Secondary | ICD-10-CM

## 2012-02-02 DIAGNOSIS — R5381 Other malaise: Secondary | ICD-10-CM

## 2012-02-02 DIAGNOSIS — R29898 Other symptoms and signs involving the musculoskeletal system: Secondary | ICD-10-CM

## 2012-02-02 DIAGNOSIS — C9000 Multiple myeloma not having achieved remission: Secondary | ICD-10-CM

## 2012-02-02 DIAGNOSIS — D702 Other drug-induced agranulocytosis: Secondary | ICD-10-CM

## 2012-02-02 DIAGNOSIS — I809 Phlebitis and thrombophlebitis of unspecified site: Secondary | ICD-10-CM

## 2012-02-02 DIAGNOSIS — I824Z9 Acute embolism and thrombosis of unspecified deep veins of unspecified distal lower extremity: Secondary | ICD-10-CM | POA: Insufficient documentation

## 2012-02-02 LAB — CBC WITH DIFFERENTIAL/PLATELET
BASO%: 1.1 % (ref 0.0–2.0)
Basophils Absolute: 0.1 10*3/uL (ref 0.0–0.1)
EOS%: 5.2 % (ref 0.0–7.0)
Eosinophils Absolute: 0.3 10*3/uL (ref 0.0–0.5)
HCT: 39.1 % (ref 38.4–49.9)
HGB: 13.2 g/dL (ref 13.0–17.1)
LYMPH%: 18.9 % (ref 14.0–49.0)
MCH: 32.5 pg (ref 27.2–33.4)
MCHC: 33.8 g/dL (ref 32.0–36.0)
MCV: 96.2 fL (ref 79.3–98.0)
MONO#: 0.5 10*3/uL (ref 0.1–0.9)
MONO%: 9.3 % (ref 0.0–14.0)
NEUT#: 3.4 10*3/uL (ref 1.5–6.5)
NEUT%: 65.5 % (ref 39.0–75.0)
Platelets: 246 10*3/uL (ref 140–400)
RBC: 4.07 10*6/uL — ABNORMAL LOW (ref 4.20–5.82)
RDW: 15.2 % — ABNORMAL HIGH (ref 11.0–14.6)
WBC: 5.1 10*3/uL (ref 4.0–10.3)
lymph#: 1 10*3/uL (ref 0.9–3.3)

## 2012-02-02 LAB — PROTIME-INR
INR: 1.1 — ABNORMAL LOW (ref 2.00–3.50)
Protime: 13.2 Seconds (ref 10.6–13.4)

## 2012-02-02 MED ORDER — LOVENOX 150 MG/ML ~~LOC~~ SOLN: 130.0000 mg | Freq: Every day | SUBCUTANEOUS | Status: DC

## 2012-02-02 MED ORDER — WARFARIN SODIUM 5 MG PO TABS: 5.0000 mg | ORAL_TABLET | Freq: Every day | ORAL | Status: DC

## 2012-02-02 MED ORDER — ENOXAPARIN SODIUM 150 MG/ML ~~LOC~~ SOLN
130.0000 mg | Freq: Once | SUBCUTANEOUS | Status: AC
  Administered 2012-02-02: 130 mg via SUBCUTANEOUS
  Filled 2012-02-02: qty 1

## 2012-02-02 NOTE — Telephone Encounter (Signed)
appt made for 3/18 and pt sent back to the lab

## 2012-02-02 NOTE — Progress Notes (Signed)
OFFICE PROGRESS NOTE   INTERVAL HISTORY:   He returns as scheduled. He began a second cycle of Revlimid on 01/28/2012. He reports profound malaise and balance difficulty on the day after taking Decadron. He complains of persistent neuropathy symptoms. He saw Dr. Anne Hahn earlier this week and will be scheduled for a cervical spine MRI and nerve conduction studies. He now has muscle weakness in the arms and legs. The tongue is swollen.  Objective:  Vital signs in last 24 hours:  Blood pressure 117/66, pulse 89, temperature 97.1 F (36.2 C), temperature source Oral, height 6\' 1"  (1.854 m), weight 204 lb 12.8 oz (92.897 kg).    HEENT: There is enlargement of the tongue. No thrush or ulcers. Ecchymosis at the left buccal mucosa Resp: Lungs clear bilaterally Cardio: Regular rate and rhythm GI: No hepatosplenomegaly Vascular: Trace pitting edema at the left foot, trace to 1+ edema at the right lower leg, ankle, and foot Neuro: 4+/5 strength at the proximal arms and legs  Skin: Small ecchymoses surrounding the orbits   Lab Results:  Lab Results  Component Value Date   WBC 5.1 02/02/2012   HGB 13.2 02/02/2012   HCT 39.1 02/02/2012   MCV 96.2 02/02/2012   PLT 246 02/02/2012   ANC 3.4   Medications: I have reviewed the patient's current medications.  Assessment/Plan: 1. 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 2. "Bruising" at the eyelids and mouth: Likely related to amyloidosis, persistent. 3. Numbness and loss of vibratory sense at the fingertips: This predated Velcade-based therapy, but has worsened. 4. Elevated serum free lambda light chains. The lambda light chains were lower on November 16 and slightly higher on 12/14/2011. 5. Lambda light chain proteinuria. 6. Bone marrow plasmacytosis: Variable increase in plasma cells noted on the bone marrow biopsy 07/29/2011 with plasma cells estimated to represent between 4% and 20% of the cellular population. 7. Remote history of  prostate cancer. 8. Sleep apnea. 9. Dyslipidemia. 10. Report of pneumonia on 2 occasions in 2011. 11. Admission to a hospital in [place], [place] October 2012 with "pneumonia." A chest x-ray at [facility] on November 2 was negative .  12. Low serum immunoglobulin G level. 13. Borderline-low hemoglobin level, improved. 14. Plasma cell dyscrasia with associated amyloidosis.  a. Initiation of systemic therapy with Cytoxan, Velcade, and Decadron 08/12/2011. Cycle #2 was initiated on 09/16/2011. Cycle #3 was initiated on 10/15/2011. Velcade was placed on hold due to 2 neuropathy. He continues weekly Cytoxan/Decadron b. The serum free lambda light chains were decreased on October 08 2011-7/21 2012. c. The serum free lambda light chains were slightly increased on 12/14/2011. d. Initiation of Revlimid/Decadron February 2013, cycle 2 started on 01/28/2012 15. Loss of "balance "and proximal motor weakness-? Related to neuropathy , we will followup on the evaluation by Dr. Anne Hahn. 16. Right lower showed a deep vein thrombosis-a Doppler ultrasound confirmed a gastrocnemius and peroneal deep vein thrombosis. He will begin Lovenox/Coumadin anticoagulation  Disposition:  He is completing a second cycle of Revlimid. He develops profound malaise on the day after taking Decadron therapy and now has proximal motor weakness. We decided to discontinue Decadron therapy. He will complete the current cycle of Revlimid.  We obtained a repeat serum free light chain analysis today. He will return for an office visit and another serum light chain analysis on 02/18/2012.  I am concerned the neuropathy symptoms are related to progression of the amyloidosis. He will continue followup with Dr. Anne Hahn.  I will contact Dr.Tuchman if he  is not improved after the current cycle of Revlimid to discuss salvage treatment options.  He will begin Lovenox/Coumadin anticoagulation for treatment of the right lower showed a deep  vein thrombosis.   Brent Shutters, MD  02/02/2012  12:33 PM

## 2012-02-02 NOTE — Telephone Encounter (Signed)
Pt appt made and printed for 3/29 and pt going now to rad for u/s    aom

## 2012-02-02 NOTE — Progress Notes (Signed)
VASCULAR LAB PRELIMINARY  PRELIMINARY  PRELIMINARY  PRELIMINARY  Right lower extremity venous duplex completed.    Preliminary report:  Right:  DVT noted in the gastrocnemius vein and peroneal vein .  No evidence of superficial thrombosis.  No Baker's cyst.  Terance Hart, RVT 02/02/2012, 10:45 AM

## 2012-02-02 NOTE — Progress Notes (Addendum)
Pt of Dr. Truett Perna referred to [facility] Coumadin clinic w/ h/o DVT. Pt is on Coumadin 5 mg/day (dose as of 02/03/12) along w/ Lovenox injections. Planned duration of anticoagulation: Indefinite INR goal = 2-3 Risk Factors for bleeding: Bruising d/t amyloid Other info.: pt on Revlimid for amyloid (dexamethasone on hold 3/13) 1st visit to [facility] Coumadin clinic: 02/07/12. Marily Lente, Pharm.D.

## 2012-02-04 LAB — PROTEIN ELECTROPHORESIS, SERUM
Albumin ELP: 56.3 % (ref 55.8–66.1)
Alpha-1-Globulin: 5.8 % — ABNORMAL HIGH (ref 2.9–4.9)
Alpha-2-Globulin: 15.3 % — ABNORMAL HIGH (ref 7.1–11.8)
Beta 2: 4.6 % (ref 3.2–6.5)
Beta Globulin: 7 % (ref 4.7–7.2)
Gamma Globulin: 11 % — ABNORMAL LOW (ref 11.1–18.8)
Total Protein, Serum Electrophoresis: 5.6 g/dL — ABNORMAL LOW (ref 6.0–8.3)

## 2012-02-04 LAB — KAPPA/LAMBDA LIGHT CHAINS
Kappa free light chain: 0.75 mg/dL (ref 0.33–1.94)
Kappa:Lambda Ratio: 0.06 — ABNORMAL LOW (ref 0.26–1.65)
Lambda Free Lght Chn: 12.5 mg/dL — ABNORMAL HIGH (ref 0.57–2.63)

## 2012-02-04 LAB — COMPREHENSIVE METABOLIC PANEL
ALT: 15 U/L (ref 0–53)
AST: 15 U/L (ref 0–37)
Albumin: 3.6 g/dL (ref 3.5–5.2)
Alkaline Phosphatase: 56 U/L (ref 39–117)
BUN: 11 mg/dL (ref 6–23)
CO2: 23 mEq/L (ref 19–32)
Calcium: 9.4 mg/dL (ref 8.4–10.5)
Chloride: 107 mEq/L (ref 96–112)
Creatinine, Ser: 0.88 mg/dL (ref 0.50–1.35)
Glucose, Bld: 107 mg/dL — ABNORMAL HIGH (ref 70–99)
Potassium: 3.8 mEq/L (ref 3.5–5.3)
Sodium: 139 mEq/L (ref 135–145)
Total Bilirubin: 0.4 mg/dL (ref 0.3–1.2)
Total Protein: 5.6 g/dL — ABNORMAL LOW (ref 6.0–8.3)

## 2012-02-07 ENCOUNTER — Other Ambulatory Visit: Payer: Medicare Other | Admitting: Lab

## 2012-02-07 ENCOUNTER — Ambulatory Visit (HOSPITAL_BASED_OUTPATIENT_CLINIC_OR_DEPARTMENT_OTHER): Payer: Medicare Other | Admitting: Pharmacist

## 2012-02-07 DIAGNOSIS — I82409 Acute embolism and thrombosis of unspecified deep veins of unspecified lower extremity: Secondary | ICD-10-CM

## 2012-02-07 DIAGNOSIS — M7989 Other specified soft tissue disorders: Secondary | ICD-10-CM

## 2012-02-07 LAB — POCT INR: INR: 1.3

## 2012-02-07 LAB — PROTIME-INR
INR: 1.3 — ABNORMAL LOW (ref 2.00–3.50)
Protime: 15.6 Seconds — ABNORMAL HIGH (ref 10.6–13.4)

## 2012-02-07 MED ORDER — ENOXAPARIN SODIUM 150 MG/ML ~~LOC~~ SOLN: 130.0000 mg | Freq: Every day | SUBCUTANEOUS | Status: DC

## 2012-02-07 NOTE — Progress Notes (Signed)
Pt initiated on Lovenox 130 mg/day & Coumadin 5 mg/day on 3/14 for DVT. He has had no sxs of bleeding although he states his abdomen is bruised from the Lovenox injections. He had his Lovenox filled at [facility] & he got 2 types of syringes.  The one he prefers (can more easily read his dose) his the safety needle (spring pops over needle when done). INR still subtherapeutic today after 4 doses of Coumadin.  Has risen from his baseline of 1.1 though. He haves 3-4 salads a week.  He eats 1 green vegetable/day, sometimes spinach casserole.  We discussed avoiding any swings in general diet. I encouraged him to eat fresh spinach if he has to eat it (rather than frozen which contains more vit K). He takes Ibuprofen 600 mg PRN.  He states he may use it 1 time/month, if that.  He understands the risk of taking it regularly includes bleeding. Continue Lovenox until INR = 2-3.  I refilled Lovenox at [facility] & requested they fill w/ the safety syringe. Continue Coumadin 5 mg/day. A full discussion of the nature of anticoagulants has been carried out.  A benefit risk analysis has been presented to the patient, so that they understand the justification for choosing anticoagulation at this time. The need for frequent and regular monitoring, precise dosage adjustment and compliance is stressed.  Side effects of potential bleeding are discussed.  Avoid alcohol consumption in extremes; pt drinks 1 glass of wine or 1 beer per week.  Call if any signs of abnormal bleeding.  Return on Thurs. 3/21 for next protime. Marily Lente, Pharm.D.

## 2012-02-08 ENCOUNTER — Encounter: Payer: Self-pay | Admitting: Oncology

## 2012-02-08 NOTE — Progress Notes (Signed)
Per Dr. Truett Perna he is no longer on Velcade.  His prior authorization expired on 02/07/12. We do not need to renew it at this time.

## 2012-02-10 ENCOUNTER — Ambulatory Visit (HOSPITAL_BASED_OUTPATIENT_CLINIC_OR_DEPARTMENT_OTHER): Payer: Medicare Other | Admitting: Pharmacist

## 2012-02-10 ENCOUNTER — Telehealth: Payer: Self-pay | Admitting: *Deleted

## 2012-02-10 ENCOUNTER — Other Ambulatory Visit: Payer: Medicare Other | Admitting: Lab

## 2012-02-10 DIAGNOSIS — I82409 Acute embolism and thrombosis of unspecified deep veins of unspecified lower extremity: Secondary | ICD-10-CM

## 2012-02-10 LAB — PROTIME-INR
INR: 1.5 — ABNORMAL LOW (ref 2.00–3.50)
Protime: 18 Seconds — ABNORMAL HIGH (ref 10.6–13.4)

## 2012-02-10 LAB — POCT INR: INR: 1.5

## 2012-02-10 NOTE — Telephone Encounter (Signed)
Called pt, light chains are better, per Dr. Truett Perna. Continue Revlimid. Pt verbalized understanding.

## 2012-02-10 NOTE — Patient Instructions (Signed)
Continue Lovenox injections (130 mg/day). Coumadin 7.5 mg today and Saturday and 5mg  on Friday and Sunday. Return on 02/14/12 at 8:30 am for lab; 8:45 am for Coumadin clinic

## 2012-02-10 NOTE — Telephone Encounter (Signed)
Message copied by Caleb Popp on Thu Feb 10, 2012  1:12 PM ------      Message from: Ladene Artist      Created: Sat Feb 05, 2012 10:49 AM       Please call patient, light chains are better, cont. Revlimid

## 2012-02-10 NOTE — Progress Notes (Signed)
Continue Lovenox injections (130 mg/day). Coumadin 7.5 mg today and Saturday and 5mg on Friday and Sunday. Return on 02/14/12 at 8:30 am for lab; 8:45 am for Coumadin clinic  

## 2012-02-11 ENCOUNTER — Other Ambulatory Visit: Payer: Self-pay | Admitting: Neurology

## 2012-02-11 DIAGNOSIS — R209 Unspecified disturbances of skin sensation: Secondary | ICD-10-CM

## 2012-02-11 DIAGNOSIS — R269 Unspecified abnormalities of gait and mobility: Secondary | ICD-10-CM

## 2012-02-14 ENCOUNTER — Ambulatory Visit (HOSPITAL_BASED_OUTPATIENT_CLINIC_OR_DEPARTMENT_OTHER): Payer: Medicare Other | Admitting: Pharmacist

## 2012-02-14 ENCOUNTER — Other Ambulatory Visit (HOSPITAL_BASED_OUTPATIENT_CLINIC_OR_DEPARTMENT_OTHER): Payer: Medicare Other

## 2012-02-14 DIAGNOSIS — I82409 Acute embolism and thrombosis of unspecified deep veins of unspecified lower extremity: Secondary | ICD-10-CM

## 2012-02-14 LAB — PROTIME-INR
INR: 1.8 — ABNORMAL LOW (ref 2.00–3.50)
Protime: 21.6 Seconds — ABNORMAL HIGH (ref 10.6–13.4)

## 2012-02-14 LAB — POCT INR: INR: 1.8

## 2012-02-14 NOTE — Patient Instructions (Addendum)
Increase Coumadin slightly to 7.5mg  three times/wk = 5mg  daily except 7.5mg  on MWF. Continue Lovenox 130mg  daily. Pt requested the 150mg  syringes today. He is aware how to give himself 130mg . 4 additional syringes provided to patient. Will recheck INR on 3/29 with MD appointment.

## 2012-02-14 NOTE — Progress Notes (Signed)
Increase Coumadin slightly to 7.5mg three times/wk = 5mg daily except 7.5mg on MWF. Continue Lovenox 130mg daily. Pt requested the 150mg syringes today. He is aware how to give himself 130mg. 4 additional syringes provided to patient. Will recheck INR on 3/29 with MD appointment. 

## 2012-02-16 ENCOUNTER — Telehealth: Payer: Self-pay | Admitting: *Deleted

## 2012-02-16 ENCOUNTER — Ambulatory Visit
Admission: RE | Admit: 2012-02-16 | Discharge: 2012-02-16 | Disposition: A | Discharge status: Home / Self Care | Payer: Medicare Other | Source: Ambulatory Visit | Attending: Neurology | Admitting: Neurology

## 2012-02-16 DIAGNOSIS — R269 Unspecified abnormalities of gait and mobility: Secondary | ICD-10-CM

## 2012-02-16 DIAGNOSIS — R209 Unspecified disturbances of skin sensation: Secondary | ICD-10-CM

## 2012-02-16 MED ORDER — PROCHLORPERAZINE MALEATE 10 MG PO TABS: 10.0000 mg | ORAL_TABLET | Freq: Four times a day (QID) | ORAL | Status: DC | PRN

## 2012-02-16 NOTE — Telephone Encounter (Signed)
Pt left message with switchboard around 8pm on 3/26 that he has been unable to eat for 2 days. Returned call this morning, he stated he has been having nausea and abdominal pain. Mild constipation- taking Senokot. Reviewed with Dr. Truett Perna: not likely the Revlimid causing this. OK to call in Compazine. Rx sent electronically.

## 2012-02-18 ENCOUNTER — Other Ambulatory Visit: Payer: Self-pay | Admitting: *Deleted

## 2012-02-18 ENCOUNTER — Telehealth: Payer: Self-pay | Admitting: Oncology

## 2012-02-18 ENCOUNTER — Other Ambulatory Visit (HOSPITAL_BASED_OUTPATIENT_CLINIC_OR_DEPARTMENT_OTHER): Payer: Medicare Other | Admitting: Lab

## 2012-02-18 ENCOUNTER — Ambulatory Visit: Payer: Medicare Other

## 2012-02-18 ENCOUNTER — Ambulatory Visit (HOSPITAL_BASED_OUTPATIENT_CLINIC_OR_DEPARTMENT_OTHER): Payer: Medicare Other | Admitting: Oncology

## 2012-02-18 ENCOUNTER — Encounter: Payer: Self-pay | Admitting: Pharmacist

## 2012-02-18 DIAGNOSIS — I82409 Acute embolism and thrombosis of unspecified deep veins of unspecified lower extremity: Secondary | ICD-10-CM

## 2012-02-18 DIAGNOSIS — M7989 Other specified soft tissue disorders: Secondary | ICD-10-CM

## 2012-02-18 DIAGNOSIS — C9000 Multiple myeloma not having achieved remission: Secondary | ICD-10-CM

## 2012-02-18 LAB — CBC WITH DIFFERENTIAL/PLATELET
BASO%: 1 % (ref 0.0–2.0)
Basophils Absolute: 0 10*3/uL (ref 0.0–0.1)
EOS%: 17.7 % — ABNORMAL HIGH (ref 0.0–7.0)
Eosinophils Absolute: 0.5 10*3/uL (ref 0.0–0.5)
HCT: 37.8 % — ABNORMAL LOW (ref 38.4–49.9)
HGB: 12.7 g/dL — ABNORMAL LOW (ref 13.0–17.1)
LYMPH%: 25.3 % (ref 14.0–49.0)
MCH: 32.1 pg (ref 27.2–33.4)
MCHC: 33.6 g/dL (ref 32.0–36.0)
MCV: 95.5 fL (ref 79.3–98.0)
MONO#: 0.8 10*3/uL (ref 0.1–0.9)
MONO%: 24.7 % — ABNORMAL HIGH (ref 0.0–14.0)
NEUT#: 1 10*3/uL — ABNORMAL LOW (ref 1.5–6.5)
NEUT%: 31.3 % — ABNORMAL LOW (ref 39.0–75.0)
Platelets: 140 10*3/uL (ref 140–400)
RBC: 3.96 10*6/uL — ABNORMAL LOW (ref 4.20–5.82)
RDW: 14.9 % — ABNORMAL HIGH (ref 11.0–14.6)
WBC: 3.1 10*3/uL — ABNORMAL LOW (ref 4.0–10.3)
lymph#: 0.8 10*3/uL — ABNORMAL LOW (ref 0.9–3.3)
nRBC: 0 % (ref 0–0)

## 2012-02-18 LAB — PROTIME-INR
INR: 2.7 (ref 2.00–3.50)
Protime: 32.4 Seconds — ABNORMAL HIGH (ref 10.6–13.4)

## 2012-02-18 MED ORDER — ENOXAPARIN SODIUM 150 MG/ML ~~LOC~~ SOLN: 130.0000 mg | Freq: Every day | SUBCUTANEOUS | Status: DC

## 2012-02-18 NOTE — Progress Notes (Signed)
OFFICE PROGRESS NOTE   INTERVAL HISTORY:   He returns as scheduled. He completed another cycle of Revlimid on March 28th 2013. He complains of nausea and abdominal discomfort for the past few days. No diarrhea. He is having bowel movements.  The numbness in the hands is unchanged. He reports stable leg weakness and gait ataxia. He underwent an MRI of the cervical spine yesterday. He was contacted by Dr. Anne Hahn last night with a report of spinal canal narrowing due to degenerative changes and he is being referred to Dr. Lovell Sheehan.  He is maintained on Coumadin for treatment of the right leg DVT. Lovenox was discontinued yesterday.  Objective:  Vital signs in last 24 hours:  Blood pressure 111/61, pulse 94, temperature 98.3 F (36.8 C), temperature source Oral, height 6\' 1"  (1.854 m), weight 199 lb 6.4 oz (90.447 kg).    HEENT: No thrush or ulcers Resp: Lungs clear bilaterally Cardio: Regular rate and rhythm GI: Mild tenderness in the low abdomen bilaterally, no hepatosplenomegaly, no mass, no apparent ascites Vascular: Trace edema at the right lower leg Neuro: Alert and oriented. 4+/5 strength with abduction at the shoulders. The biceps and hand strength appears intact. 4/5 strength with flexion at the hips, 5/5 strength with extension at the knees and dorsi flexion at the feet. The gait is ataxic.  Skin: Ecchymosis around the the left orbit   Lab Results:  Lab Results  Component Value Date   WBC 3.1* 02/18/2012   HGB 12.7* 02/18/2012   HCT 37.8* 02/18/2012   MCV 95.5 02/18/2012   PLT 140 02/18/2012   ANC 1.0 PT/INR 2.7    Medications: I have reviewed the patient's current medications.  Assessment/Plan: 1. 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 2. "Bruising" at the eyelids and mouth: Likely related to amyloidosis, persistent. 3. Numbness and loss of vibratory sense at the fingertips: This predated Velcade-based therapy, but has worsened. 4. Elevated serum free lambda  light chains. The lambda light chains were lower on November 16 and slightly higher on 12/14/2011. 5. Lambda light chain proteinuria. 6. Bone marrow plasmacytosis: Variable increase in plasma cells noted on the bone marrow biopsy 07/29/2011 with plasma cells estimated to represent between 4% and 20% of the cellular population. 7. Remote history of prostate cancer. 8. Sleep apnea. 9. Dyslipidemia. 10. Report of pneumonia on 2 occasions in 2011. 11. Admission to a hospital in [place], [place] October 2012 with "pneumonia." A chest x-ray at [facility] on November 2 was negative .  12. Low serum immunoglobulin G level. 13. Borderline-low hemoglobin level, improved. 14. Plasma cell dyscrasia with associated amyloidosis.  a. Initiation of systemic therapy with Cytoxan, Velcade, and Decadron 08/12/2011. Cycle #2 was initiated on 09/16/2011. Cycle #3 was initiated on 10/15/2011. Velcade was placed on hold due to 2 neuropathy. He continues weekly Cytoxan/Decadron b. The serum free lambda light chains were decreased on October 08 2011-7/21 2012. c. The serum free lambda light chains were slightly increased on 12/14/2011, lower on 12/29/2011 and 02/02/2012 d. Initiation of Revlimid/Decadron February 2013, cycle 2 started on 01/28/2012 15. Loss of "balance "and proximal motor weakness-this appears to be related to cervical stenosis 16. Right lower showed a deep vein thrombosis-a Doppler ultrasound confirmed a gastrocnemius and peroneal deep vein thrombosis. Status post Lovenox and now maintained on Coumadin anticoagulation.    Disposition:  He has completed 2 cycles of Revlimid for treatment of multiple myeloma with associated amyloidosis. We will followup on the serum free light chains from today.  He is symptomatic with numbness at the hands, leg weakness, and gait ataxia. I discussed the case with Dr. Anne Hahn today. He believes the neurologic symptoms are unrelated to amyloidosis. He feels the  neurologic symptoms are due to cervical stenosis. The MRI shows evidence of degenerative changes causing severe cervical spine stenosis. There is evidence of myeloma involving the adjacent bones, but no soft tissue component.  I discussed the current situation in detail with the patient and his family. He is being referred to Dr. Lovell Sheehan for a neurosurgical evaluation. He will likely have surgery within the next few weeks.  He will discontinue Coumadin. He will resume daily Lovenox on 02/20/2012. He is scheduled for a prothrombin time on 02/21/2012. The CBC will also be repeated on 02/21/2012.  He knows to seek medical attention immediately for progressive neurologic symptoms.  Mr. Jessop will return for an office visit here in 2 weeks. We decided to place further Revlimid on hold until he has completed surgery.  The GI symptoms this week may have been related to an infection or the Revlimid. He will contact us for persistent nausea or other GI symptoms.   Lucile Shutters, MD  02/18/2012  1:09 PM

## 2012-02-18 NOTE — Telephone Encounter (Signed)
appts made and printed for pt aom °

## 2012-02-18 NOTE — Progress Notes (Signed)
Dr. Truett Perna saw pt today & has decided to have pt hold Coumadin for possible surgery in the next week on his spine.  Pt will resume post-op when appropriate. Pt will hold Lovenox from now until Sunday, then restart Lovenox pre-op. We will f/u post-op in our Coumadin clinic. Marily Lente, Pharm.D.

## 2012-02-21 ENCOUNTER — Encounter: Payer: Self-pay | Admitting: *Deleted

## 2012-02-21 ENCOUNTER — Other Ambulatory Visit (HOSPITAL_BASED_OUTPATIENT_CLINIC_OR_DEPARTMENT_OTHER): Payer: Medicare Other | Admitting: Lab

## 2012-02-21 DIAGNOSIS — I82409 Acute embolism and thrombosis of unspecified deep veins of unspecified lower extremity: Secondary | ICD-10-CM

## 2012-02-21 DIAGNOSIS — C9000 Multiple myeloma not having achieved remission: Secondary | ICD-10-CM

## 2012-02-21 LAB — CBC WITH DIFFERENTIAL/PLATELET
BASO%: 1.9 % (ref 0.0–2.0)
Basophils Absolute: 0.1 10*3/uL (ref 0.0–0.1)
EOS%: 9.2 % — ABNORMAL HIGH (ref 0.0–7.0)
Eosinophils Absolute: 0.4 10*3/uL (ref 0.0–0.5)
HCT: 35.9 % — ABNORMAL LOW (ref 38.4–49.9)
HGB: 12.1 g/dL — ABNORMAL LOW (ref 13.0–17.1)
LYMPH%: 31.5 % (ref 14.0–49.0)
MCH: 32.1 pg (ref 27.2–33.4)
MCHC: 33.8 g/dL (ref 32.0–36.0)
MCV: 95 fL (ref 79.3–98.0)
MONO#: 0.9 10*3/uL (ref 0.1–0.9)
MONO%: 22.7 % — ABNORMAL HIGH (ref 0.0–14.0)
NEUT#: 1.4 10*3/uL — ABNORMAL LOW (ref 1.5–6.5)
NEUT%: 34.7 % — ABNORMAL LOW (ref 39.0–75.0)
Platelets: 167 10*3/uL (ref 140–400)
RBC: 3.78 10*6/uL — ABNORMAL LOW (ref 4.20–5.82)
RDW: 14.7 % — ABNORMAL HIGH (ref 11.0–14.6)
WBC: 4.1 10*3/uL (ref 4.0–10.3)
lymph#: 1.3 10*3/uL (ref 0.9–3.3)
nRBC: 0 % (ref 0–0)

## 2012-02-21 LAB — COMPREHENSIVE METABOLIC PANEL
ALT: 12 U/L (ref 0–53)
AST: 16 U/L (ref 0–37)
Albumin: 3.5 g/dL (ref 3.5–5.2)
Alkaline Phosphatase: 50 U/L (ref 39–117)
BUN: 10 mg/dL (ref 6–23)
CO2: 27 mEq/L (ref 19–32)
Calcium: 8.7 mg/dL (ref 8.4–10.5)
Chloride: 105 mEq/L (ref 96–112)
Creatinine, Ser: 0.82 mg/dL (ref 0.50–1.35)
Glucose, Bld: 97 mg/dL (ref 70–99)
Potassium: 3.4 mEq/L — ABNORMAL LOW (ref 3.5–5.3)
Sodium: 140 mEq/L (ref 135–145)
Total Bilirubin: 0.7 mg/dL (ref 0.3–1.2)
Total Protein: 5.6 g/dL — ABNORMAL LOW (ref 6.0–8.3)

## 2012-02-21 LAB — PROTIME-INR
INR: 1.6 — ABNORMAL LOW (ref 2.00–3.50)
Protime: 19.2 Seconds — ABNORMAL HIGH (ref 10.6–13.4)

## 2012-02-21 LAB — KAPPA/LAMBDA LIGHT CHAINS
Kappa free light chain: 1.88 mg/dL (ref 0.33–1.94)
Kappa:Lambda Ratio: 0.11 — ABNORMAL LOW (ref 0.26–1.65)
Lambda Free Lght Chn: 16.6 mg/dL — ABNORMAL HIGH (ref 0.57–2.63)

## 2012-02-21 NOTE — Progress Notes (Signed)
Refill request from [facility] for Revlimid.  Per Dr. Kalman Drape note on 02/18/12  "Revlimid on hold."   Faxed request back to [facility] with that information.

## 2012-02-22 ENCOUNTER — Telehealth: Payer: Self-pay | Admitting: *Deleted

## 2012-02-22 NOTE — Telephone Encounter (Signed)
Made patient aware that Dr. Lovell Sheehan is out of town this week, but Dr. Truett Perna spoke with Dr. Shirlean Kelly, who agrees to see Brent Allison this week.  Records have been faxed to office by medical records att: Becky. Confirmed with Kriste Basque that they will be in touch with patient.

## 2012-02-23 ENCOUNTER — Telehealth: Payer: Self-pay | Admitting: *Deleted

## 2012-02-23 ENCOUNTER — Other Ambulatory Visit: Payer: Self-pay | Admitting: Neurosurgery

## 2012-02-23 ENCOUNTER — Other Ambulatory Visit: Payer: Self-pay | Admitting: Radiology

## 2012-02-23 ENCOUNTER — Other Ambulatory Visit (HOSPITAL_COMMUNITY): Payer: Self-pay | Admitting: Neurosurgery

## 2012-02-23 DIAGNOSIS — I82409 Acute embolism and thrombosis of unspecified deep veins of unspecified lower extremity: Secondary | ICD-10-CM

## 2012-02-23 NOTE — Telephone Encounter (Addendum)
Patient called concerned that he still has not heard from NS office (Dr. Jule Ser) about his appointment. Reports he is very uncomfortable.  Notified patient that our office has made referral and sent records. Gave him the phone # of Dr. Antony Contras office to call himself.

## 2012-02-24 ENCOUNTER — Ambulatory Visit (HOSPITAL_COMMUNITY)
Admission: RE | Admit: 2012-02-24 | Discharge: 2012-02-24 | Disposition: A | Discharge status: Home / Self Care | Payer: Medicare Other | Source: Ambulatory Visit | Attending: Neurosurgery | Admitting: Neurosurgery

## 2012-02-24 ENCOUNTER — Encounter (HOSPITAL_COMMUNITY): Payer: Self-pay

## 2012-02-24 VITALS — BP 116/66 | HR 97 | Temp 98.1°F | Resp 16 | Ht 73.0 in | Wt 195.0 lb

## 2012-02-24 DIAGNOSIS — Z7901 Long term (current) use of anticoagulants: Secondary | ICD-10-CM | POA: Insufficient documentation

## 2012-02-24 DIAGNOSIS — C9000 Multiple myeloma not having achieved remission: Secondary | ICD-10-CM | POA: Insufficient documentation

## 2012-02-24 DIAGNOSIS — IMO0002 Reserved for concepts with insufficient information to code with codable children: Secondary | ICD-10-CM | POA: Insufficient documentation

## 2012-02-24 DIAGNOSIS — Z8546 Personal history of malignant neoplasm of prostate: Secondary | ICD-10-CM | POA: Insufficient documentation

## 2012-02-24 DIAGNOSIS — G8250 Quadriplegia, unspecified: Secondary | ICD-10-CM | POA: Insufficient documentation

## 2012-02-24 DIAGNOSIS — M542 Cervicalgia: Secondary | ICD-10-CM | POA: Insufficient documentation

## 2012-02-24 DIAGNOSIS — Z86718 Personal history of other venous thrombosis and embolism: Secondary | ICD-10-CM | POA: Insufficient documentation

## 2012-02-24 DIAGNOSIS — I82409 Acute embolism and thrombosis of unspecified deep veins of unspecified lower extremity: Secondary | ICD-10-CM

## 2012-02-24 HISTORY — DX: Acute embolism and thrombosis of unspecified deep veins of unspecified lower extremity: I82.409

## 2012-02-24 HISTORY — DX: Personal history of other malignant neoplasms of lymphoid, hematopoietic and related tissues: Z85.79

## 2012-02-24 HISTORY — DX: Quadriplegia, unspecified: G82.50

## 2012-02-24 LAB — BASIC METABOLIC PANEL
BUN: 8 mg/dL (ref 6–23)
CO2: 29 mEq/L (ref 19–32)
Calcium: 9 mg/dL (ref 8.4–10.5)
Chloride: 103 mEq/L (ref 96–112)
Creatinine, Ser: 0.66 mg/dL (ref 0.50–1.35)
GFR calc Af Amer: >90 mL/min (ref >90)
GFR calc non Af Amer: >90 mL/min (ref >90)
Glucose, Bld: 93 mg/dL (ref 70–99)
Potassium: 3.5 mEq/L (ref 3.5–5.1)
Sodium: 140 mEq/L (ref 135–145)

## 2012-02-24 LAB — CBC
HCT: 34.7 % — ABNORMAL LOW (ref 39.0–52.0)
Hemoglobin: 11.9 g/dL — ABNORMAL LOW (ref 13.0–17.0)
MCH: 31.6 pg (ref 26.0–34.0)
MCHC: 34.3 g/dL (ref 30.0–36.0)
MCV: 92.3 fL (ref 78.0–100.0)
Platelets: 216 10*3/uL (ref 150–400)
RBC: 3.76 MIL/uL — ABNORMAL LOW (ref 4.22–5.81)
RDW: 13.6 % (ref 11.5–15.5)
WBC: 6.7 10*3/uL (ref 4.0–10.5)

## 2012-02-24 LAB — PROTIME-INR
INR: 1.35 (ref 0.00–1.49)
Prothrombin Time: 16.9 seconds — ABNORMAL HIGH (ref 11.6–15.2)

## 2012-02-24 LAB — APTT: aPTT: 25 seconds (ref 24–37)

## 2012-02-24 MED ORDER — FENTANYL CITRATE 0.05 MG/ML IJ SOLN
INTRAMUSCULAR | Status: AC | PRN
  Administered 2012-02-24: 50 ug via INTRAVENOUS

## 2012-02-24 MED ORDER — FENTANYL CITRATE 0.05 MG/ML IJ SOLN
INTRAMUSCULAR | Status: AC
  Filled 2012-02-24: qty 4

## 2012-02-24 MED ORDER — IOHEXOL 300 MG/ML  SOLN
150.0000 mL | Freq: Once | INTRAMUSCULAR | Status: AC | PRN
  Administered 2012-02-24: 40 mL via INTRAVENOUS

## 2012-02-24 MED ORDER — MIDAZOLAM HCL 2 MG/2ML IJ SOLN
INTRAMUSCULAR | Status: AC
  Filled 2012-02-24: qty 6

## 2012-02-24 MED ORDER — MIDAZOLAM HCL 5 MG/5ML IJ SOLN
INTRAMUSCULAR | Status: AC | PRN
  Administered 2012-02-24: 2 mg via INTRAVENOUS

## 2012-02-24 MED ORDER — SODIUM CHLORIDE 0.9 % IV SOLN: Freq: Once | INTRAVENOUS | Status: DC

## 2012-02-24 NOTE — H&P (Signed)
Brent Allison is an 66 y.o. male.   Chief Complaint: DVT on anticoagulation.  In need of cervical decompression. Request has been made for temporary IVC filter placement pre-operatively while off anti-coagulation. HPI: Multiple myeloma with spastic quadriparesis and increasing neck pain and extremity weakness. For decompression by Dr. Newell Coral within the week.   Past Medical History  Diagnosis Date  . Prostate cancer   . Sleep apnea   . Dyslipidemia   . H/O multiple myeloma   . Spastic quadriparesis   . DVT (deep venous thrombosis)     Past Surgical History  Procedure Date  . Prostatectomy   . Posterior laminectomy / decompression lumbar spine     L4-5  . Carpal tunnel release 2009    left  . Tonsillectomy    No prior complications with anesthesia.   Social History:  does not have a smoking history on file. He does not have any smokeless tobacco history on file. His alcohol and drug histories not on file.  Allergies: No Known Allergies  Medications Prior to Admission  Medication Sig Dispense Refill  . acyclovir (ZOVIRAX) 400 MG tablet Take 1 tablet (400 mg total) by mouth 2 (two) times daily.  60 tablet  3  . aspirin 81 MG tablet Take 81 mg by mouth daily.        Marland Kitchen enoxaparin (LOVENOX) 150 MG/ML injection Inject 0.87 mLs (130 mg total) into the skin daily.  5 mL  0  . enoxaparin (LOVENOX) 150 MG/ML injection Inject 0.87 mLs (130 mg total) into the skin daily.  21 Syringe  0  . ibuprofen (ADVIL,MOTRIN) 600 MG tablet Take 600 mg by mouth every 6 (six) hours as needed.        Marland Kitchen lenalidomide (REVLIMID) 15 MG capsule Take 1 capsule (15 mg total) by mouth daily. Auth # [id number] [employer]  21 capsule  0  . prochlorperazine (COMPAZINE) 10 MG tablet Take 1 tablet (10 mg total) by mouth every 6 (six) hours as needed.  30 tablet  0  . sennosides-docusate sodium (SENOKOT-S) 8.6-50 MG tablet Take 1 tablet by mouth daily as needed.        . warfarin (COUMADIN) 5 MG tablet Take 5 mg by  mouth as directed. Take 5mg  daily except 7.5mg  on MWF.        Medications Prior to Admission  Medication Dose Route Frequency Provider Last Rate Last Dose  . 0.9 %  sodium chloride infusion   Intravenous Once Robet Leu, PA         Review of Systems  Constitutional: Positive for malaise/fatigue. Negative for fever and chills.  HENT: Positive for neck pain.   Respiratory: Negative for cough and shortness of breath.   Cardiovascular: Positive for leg swelling. Negative for chest pain and palpitations.       Sleep apnea   Gastrointestinal: Positive for heartburn. Negative for nausea, vomiting and abdominal pain.  Genitourinary: Negative for hematuria and flank pain.  Musculoskeletal: Positive for myalgias and back pain.       DVT of RLE   Neurological: Positive for tingling, sensory change, focal weakness and weakness. Negative for seizures and loss of consciousness.       Spastic quadriparesis, neuropathy   Psychiatric/Behavioral: Negative for depression and memory loss. The patient does not have insomnia.     Blood pressure 125/78, pulse 92, temperature 98.1 F (36.7 C), temperature source Oral, resp. rate 18, height 6\' 1"  (1.854 m), weight 195 lb (88.451 kg), SpO2  99.00%. Physical Exam  Constitutional: He is oriented to person, place, and time. He appears well-developed and well-nourished. No distress.  HENT:  Head: Normocephalic and atraumatic.  Neck: Normal range of motion.  Cardiovascular: Normal rate, regular rhythm and normal heart sounds.  Exam reveals no gallop and no friction rub.   No murmur heard. Respiratory: Effort normal and breath sounds normal.  GI: Soft. Bowel sounds are normal.  Musculoskeletal: Normal range of motion. He exhibits no edema.  Neurological: He is alert and oriented to person, place, and time.  Skin: Skin is warm and dry. He is not diaphoretic.  Psychiatric: He has a normal mood and affect. His behavior is normal. Judgment and thought  content normal.     Assessment/Plan IVC filter placement details discussed with patient and spouse with their apparent understanding.  Patient made aware this should be a temporary filter placement as long as surgically does well without complications and able to resume his anti-coagulation post-operatively.  All their questions answered to their satisfaction.  Written consent obtained. Labs pending - if WNL - plan to proceed with IVC filter placement.   Fortune Torosian D 02/24/2012, 10:47 AM

## 2012-02-24 NOTE — ED Notes (Signed)
Pt taken to awaiting car via wheelchair.

## 2012-02-24 NOTE — ED Notes (Signed)
Discharge instructions given to patient and his wife.

## 2012-02-24 NOTE — Discharge Instructions (Signed)
Inferior Vena Cava Filter Insertion °Insertion of an inferior vena cava (IVC) filter is usually a safe procedure. It is a procedure using a filter designed to help prevent blood clots in the legs or pelvis from traveling to the lungs. A large blood clot in the lungs can cause death. The risks involved are usually small and easily managed. This device is only used when blood thinners (anticoagulants) cannot be used. Some of these reasons may be: °· Severe platelet problems or shortage.  °· Recent or current major bleeding which cannot be treated.  °· Bleeding associated with anticoagulants.  °· Recurrence of blood clot while on anticoagulants.  °· The need for surgery in the near future.  °· Bleeding in the head.  °The filter is a small, metal device about an inch long. It is shaped like the spokes of an umbrella. The filter is placed in the inferior vena cava. The inferior vena cava is the large vein which returns blood from the lower body to the heart.  °Blood clots are sometimes treated with blood thinning drugs called anti-coagulants. Filters are used when blood thinners may not be enough. Your caregivers will discuss these issues with you. Together you can determine the best course of treatment to take. °EXPECTATIONS OF A FILTER °· An IVC filter will reduce the risk, not eliminate it and cannot prevent small PE's.  °· It does not stop deep vein clots from growing, recurring, or developing the postphlebitic syndrome.  °RISKS AND COMPLICATIONS °· Vena cava filter insertion is a very safe procedure. Occasionally a small bruise forms around the needle insertion. A larger accumulation of blood called a hematoma may form. This is usually of no concern.  °· Continued bleeding or infections are uncommon.  °· Rarely damage is done to the vein by the catheter. This may require surgery or repair. There is a possibility that the filter can cause blockage of the vena cava. This can cause some swelling of the legs. There is a  possibility that the filter will eventually fail and not work properly. Despite some problems, the procedure is usually safe and carried out without them  °PROCEDURE  °· The procedure usually takes about one half to one hour but this can vary.  °· A specialist in reading x-rays (radiologist) usually performs this procedure.  °· It is usually performed in a special room in the x-ray department. It is often done in a hospital or same-day surgical center. You will be asked to put on a hospital gown.  °· Let your caregivers know of all allergies. This is very important if you have reacted to a dye given through the vein (intravenous) used for x-rays.  °· Do not eat or drink for four hours before the procedure or as instructed by your caregiver.  °· Sedatives are sometimes given to relieve anxiety.  °· The procedure is often done through the femoral vein (big vein in the groin). The skin around this area is usually shaved.  °· You will lie on the X-ray table, generally flat on your back. You need to have a needle put into a vein in your arm, so that the radiologist can give you medications (sedatives or painkillers).  °· An oxygen monitoring device may be attached to your finger. Oxygen may be given.  °· Everything is kept as sterile as possible during the procedure. The skin near the point of needle insertion will be cleaned with antiseptic and the area draped with sterile towels.  °·   The skin and deeper tissues over the vein will be made numb with a local anesthetic. This is a medication like Novocaine. You are awake during the procedure and can let your caregivers know if you have discomfort.   A needle is then put into the vein. A guide wire is placed through the needle and into the vein. This is used to help insert a catheter into your vein. The radiologist uses the X-ray equipment to make sure that the catheter and the wire are in the correct position. The wire is then withdrawn. The filter can then be released  from the catheter and left in place in the vena cava.   The catheter is then removed. Pressure will be kept on the needle insertion point for several minutes or until it is unlikely to bleed.  SEEK IMMEDIATE MEDICAL CARE IF:   You develop swelling and discoloration or pain in the legs.   Your legs become pale and cold or blue.   You develop shortness of breath, feel faint or pass out.   You develop chest pain, cough, difficulty breathing or cough up blood.   You develop a rash or feel you are having problems which may be a side effect of medications given.   You develop weakness, difficulty moving your arms or legs or balance problems.   You develop problems with speech or vision.  Document Released: 12/29/2005 Document Revised: 10/28/2011 Document Reviewed: 12/18/2007 [employer] Patient Information 2012 [employer]  Moderate Sedation, Adult Moderate sedation is given to help you relax or even sleep through a procedure. You may remain sleepy, be clumsy, or have poor balance for several hours following this procedure. Arrange for a responsible adult, family member, or friend to take you home. A responsible adult should stay with you for at least 24 hours or until the medicines have worn off.  Do not participate in any activities where you could become injured for the next 24 hours, or until you feel normal again. Do not:   Drive.   Swim.   Ride a bicycle.   Operate heavy machinery.   Cook.   Use power tools.   Climb ladders.   Work at [employer].   Do not make important decisions or sign legal documents until you are improved.   Vomiting may occur if you eat too soon. When you can drink without vomiting, try water, juice, or soup. Try solid foods if you feel little or no nausea.   Only take over-the-counter or prescription medications for pain, discomfort, or fever as directed by your caregiver.If pain medications have been prescribed for you, ask your caregiver how soon  it is safe to take them.   Make sure you and your family fully understands everything about the medication given to you. Make sure you understand what side effects may occur.   You should not drink alcohol, take sleeping pills, or medications that cause drowsiness for at least 24 hours.   If you smoke, do not smoke alone.   If you are feeling better, you may resume normal activities 24 hours after receiving sedation.   Keep all appointments as scheduled. Follow all instructions.   Ask questions if you do not understand.  SEEK MEDICAL CARE IF:   Your skin is pale or bluish in color.   You continue to feel sick to your stomach (nauseous) or throw up (vomit).   Your pain is getting worse and not helped by medication.   You have bleeding or swelling.  You are still sleepy or feeling clumsy after 24 hours.  SEEK IMMEDIATE MEDICAL CARE IF:   You develop a rash.   You have difficulty breathing.   You develop any type of allergic problem.   You have a fever.  Document Released: 08/03/2001 Document Revised: 10/28/2011 Document Reviewed: 12/25/2007 [facility] Patient Information 2012 [place], [place].Marland Kitchen

## 2012-02-24 NOTE — ED Notes (Signed)
IV started in Short Stay

## 2012-02-24 NOTE — Procedures (Signed)
IVC gram IVC filter placement No complication No blood loss. See complete dictation in [facility].

## 2012-02-25 ENCOUNTER — Inpatient Hospital Stay (HOSPITAL_COMMUNITY): Admission: RE | Admit: 2012-02-25 | Discharge: 2012-02-25 | Payer: Medicare Other | Source: Ambulatory Visit

## 2012-02-25 ENCOUNTER — Telehealth (HOSPITAL_COMMUNITY): Payer: Self-pay

## 2012-02-25 ENCOUNTER — Encounter (HOSPITAL_COMMUNITY): Payer: Self-pay

## 2012-02-25 ENCOUNTER — Encounter (HOSPITAL_COMMUNITY): Payer: Self-pay | Admitting: Respiratory Therapy

## 2012-02-25 MED ORDER — CEFAZOLIN SODIUM 1-5 GM-% IV SOLN
1.0000 g | INTRAVENOUS | Status: AC
  Administered 2012-02-26: 1 g via INTRAVENOUS
  Filled 2012-02-25: qty 50

## 2012-02-26 ENCOUNTER — Encounter (HOSPITAL_COMMUNITY)
Admission: RE | Disposition: A | Discharge status: Home / Self Care | Payer: Self-pay | Source: Ambulatory Visit | Attending: Neurosurgery

## 2012-02-26 ENCOUNTER — Encounter (HOSPITAL_COMMUNITY): Payer: Self-pay | Admitting: Anesthesiology

## 2012-02-26 ENCOUNTER — Inpatient Hospital Stay (HOSPITAL_COMMUNITY): Payer: Medicare Other | Admitting: Anesthesiology

## 2012-02-26 ENCOUNTER — Inpatient Hospital Stay (HOSPITAL_COMMUNITY)
Admission: RE | Admit: 2012-02-26 | Discharge: 2012-02-28 | DRG: 471 | Disposition: A | Discharge status: Home / Self Care | Payer: Medicare Other | Source: Ambulatory Visit | Attending: Neurosurgery | Admitting: Neurosurgery

## 2012-02-26 ENCOUNTER — Inpatient Hospital Stay (HOSPITAL_COMMUNITY): Payer: Medicare Other

## 2012-02-26 ENCOUNTER — Other Ambulatory Visit: Payer: Self-pay

## 2012-02-26 ENCOUNTER — Encounter (HOSPITAL_COMMUNITY): Payer: Self-pay | Admitting: Neurology

## 2012-02-26 ENCOUNTER — Encounter (HOSPITAL_COMMUNITY): Payer: Self-pay | Admitting: *Deleted

## 2012-02-26 DIAGNOSIS — Z6827 Body mass index (BMI) 27.0-27.9, adult: Secondary | ICD-10-CM

## 2012-02-26 DIAGNOSIS — E785 Hyperlipidemia, unspecified: Secondary | ICD-10-CM | POA: Diagnosis present

## 2012-02-26 DIAGNOSIS — Z79899 Other long term (current) drug therapy: Secondary | ICD-10-CM

## 2012-02-26 DIAGNOSIS — Z8546 Personal history of malignant neoplasm of prostate: Secondary | ICD-10-CM

## 2012-02-26 DIAGNOSIS — Z87898 Personal history of other specified conditions: Secondary | ICD-10-CM

## 2012-02-26 DIAGNOSIS — G8250 Quadriplegia, unspecified: Secondary | ICD-10-CM | POA: Diagnosis present

## 2012-02-26 DIAGNOSIS — Z7982 Long term (current) use of aspirin: Secondary | ICD-10-CM

## 2012-02-26 DIAGNOSIS — Z86718 Personal history of other venous thrombosis and embolism: Secondary | ICD-10-CM

## 2012-02-26 DIAGNOSIS — M4712 Other spondylosis with myelopathy, cervical region: Principal | ICD-10-CM | POA: Diagnosis present

## 2012-02-26 DIAGNOSIS — G4730 Sleep apnea, unspecified: Secondary | ICD-10-CM | POA: Diagnosis present

## 2012-02-26 DIAGNOSIS — M5000 Cervical disc disorder with myelopathy, unspecified cervical region: Secondary | ICD-10-CM | POA: Diagnosis present

## 2012-02-26 HISTORY — PX: ANTERIOR CERVICAL DECOMP/DISCECTOMY FUSION: SHX1161

## 2012-02-26 HISTORY — DX: Unspecified osteoarthritis, unspecified site: M19.90

## 2012-02-26 HISTORY — DX: Pneumonia, unspecified organism: J18.9

## 2012-02-26 LAB — SURGICAL PCR SCREEN
MRSA, PCR: NEGATIVE
Staphylococcus aureus: POSITIVE — AB

## 2012-02-26 SURGERY — ANTERIOR CERVICAL DECOMPRESSION/DISCECTOMY FUSION 2 LEVELS
Anesthesia: General | Wound class: Clean

## 2012-02-26 MED ORDER — BACITRACIN 50000 UNITS IM SOLR
INTRAMUSCULAR | Status: AC
  Filled 2012-02-26: qty 1

## 2012-02-26 MED ORDER — LIDOCAINE HCL (CARDIAC) 20 MG/ML IV SOLN
INTRAVENOUS | Status: DC | PRN
  Administered 2012-02-26: 100 mg via INTRAVENOUS

## 2012-02-26 MED ORDER — ACYCLOVIR 400 MG PO TABS
400.0000 mg | ORAL_TABLET | Freq: Two times a day (BID) | ORAL | Status: DC
  Administered 2012-02-26: 400 mg via ORAL
  Filled 2012-02-26: qty 1

## 2012-02-26 MED ORDER — SODIUM CHLORIDE 0.9 % IJ SOLN
3.0000 mL | INTRAMUSCULAR | Status: DC | PRN
  Administered 2012-02-27: 3 mL via INTRAVENOUS

## 2012-02-26 MED ORDER — HEMOSTATIC AGENTS (NO CHARGE) OPTIME
TOPICAL | Status: DC | PRN
  Administered 2012-02-26: 1 via TOPICAL

## 2012-02-26 MED ORDER — MUPIROCIN 2 % EX OINT
TOPICAL_OINTMENT | CUTANEOUS | Status: AC
  Administered 2012-02-26: 1 via NASAL
  Filled 2012-02-26: qty 22

## 2012-02-26 MED ORDER — HYDROMORPHONE HCL PF 1 MG/ML IJ SOLN
0.2500 mg | INTRAMUSCULAR | Status: DC | PRN
  Administered 2012-02-26 (×2): 0.5 mg via INTRAVENOUS

## 2012-02-26 MED ORDER — SODIUM CHLORIDE 0.9 % IV SOLN: 250.0000 mL | INTRAVENOUS | Status: DC

## 2012-02-26 MED ORDER — BUPIVACAINE HCL (PF) 0.5 % IJ SOLN
INTRAMUSCULAR | Status: DC | PRN
  Administered 2012-02-26: 5 mL

## 2012-02-26 MED ORDER — HYDROXYZINE HCL 25 MG PO TABS: 50.0000 mg | ORAL_TABLET | ORAL | Status: DC | PRN

## 2012-02-26 MED ORDER — KETOROLAC TROMETHAMINE 30 MG/ML IJ SOLN
30.0000 mg | Freq: Four times a day (QID) | INTRAMUSCULAR | Status: DC
  Administered 2012-02-27 – 2012-02-28 (×7): 30 mg via INTRAVENOUS
  Filled 2012-02-26 (×7): qty 1

## 2012-02-26 MED ORDER — ONDANSETRON HCL 4 MG/2ML IJ SOLN
INTRAMUSCULAR | Status: DC | PRN
  Administered 2012-02-26: 4 mg via INTRAVENOUS

## 2012-02-26 MED ORDER — SENNA-DOCUSATE SODIUM 8.6-50 MG PO TABS: 2.0000 | ORAL_TABLET | Freq: Every day | ORAL | Status: DC | PRN

## 2012-02-26 MED ORDER — ACETAMINOPHEN 650 MG RE SUPP: 650.0000 mg | RECTAL | Status: DC | PRN

## 2012-02-26 MED ORDER — DEXAMETHASONE SODIUM PHOSPHATE 4 MG/ML IJ SOLN
INTRAMUSCULAR | Status: DC | PRN
  Administered 2012-02-26: 8 mg via INTRAVENOUS

## 2012-02-26 MED ORDER — MENTHOL 3 MG MT LOZG
1.0000 | LOZENGE | OROMUCOSAL | Status: DC | PRN
  Administered 2012-02-27: 3 mg via ORAL
  Filled 2012-02-26: qty 9

## 2012-02-26 MED ORDER — SODIUM CHLORIDE 0.9 % IR SOLN
Status: DC | PRN
  Administered 2012-02-26: 13:00:00

## 2012-02-26 MED ORDER — FENTANYL CITRATE 0.05 MG/ML IJ SOLN
INTRAMUSCULAR | Status: DC | PRN
  Administered 2012-02-26 (×2): 50 ug via INTRAVENOUS
  Administered 2012-02-26: 25 ug via INTRAVENOUS
  Administered 2012-02-26: 50 ug via INTRAVENOUS

## 2012-02-26 MED ORDER — BISACODYL 10 MG RE SUPP: 10.0000 mg | Freq: Every day | RECTAL | Status: DC | PRN

## 2012-02-26 MED ORDER — FENTANYL CITRATE 0.05 MG/ML IJ SOLN: 50.0000 ug | INTRAMUSCULAR | Status: DC | PRN

## 2012-02-26 MED ORDER — HYDROCODONE-ACETAMINOPHEN 5-325 MG PO TABS: 1.0000 | ORAL_TABLET | ORAL | Status: DC | PRN

## 2012-02-26 MED ORDER — GLYCOPYRROLATE 0.2 MG/ML IJ SOLN
INTRAMUSCULAR | Status: DC | PRN
  Administered 2012-02-26: .5 mg via INTRAVENOUS

## 2012-02-26 MED ORDER — ACYCLOVIR 200 MG PO CAPS
400.0000 mg | ORAL_CAPSULE | Freq: Two times a day (BID) | ORAL | Status: DC
  Administered 2012-02-27 – 2012-02-28 (×3): 400 mg via ORAL
  Filled 2012-02-26 (×4): qty 2

## 2012-02-26 MED ORDER — MIDAZOLAM HCL 2 MG/2ML IJ SOLN: 1.0000 mg | INTRAMUSCULAR | Status: DC | PRN

## 2012-02-26 MED ORDER — LIDOCAINE-EPINEPHRINE 1 %-1:100000 IJ SOLN
INTRAMUSCULAR | Status: DC | PRN
  Administered 2012-02-26: 5 mL

## 2012-02-26 MED ORDER — PHENOL 1.4 % MT LIQD: 1.0000 | OROMUCOSAL | Status: DC | PRN

## 2012-02-26 MED ORDER — ROCURONIUM BROMIDE 100 MG/10ML IV SOLN
INTRAVENOUS | Status: DC | PRN
  Administered 2012-02-26 (×2): 50 mg via INTRAVENOUS

## 2012-02-26 MED ORDER — ZOLPIDEM TARTRATE 5 MG PO TABS: 5.0000 mg | ORAL_TABLET | Freq: Every evening | ORAL | Status: DC | PRN

## 2012-02-26 MED ORDER — HETASTARCH-ELECTROLYTES 6 % IV SOLN
INTRAVENOUS | Status: DC | PRN
  Administered 2012-02-26: 13:00:00 via INTRAVENOUS

## 2012-02-26 MED ORDER — 0.9 % SODIUM CHLORIDE (POUR BTL) OPTIME
TOPICAL | Status: DC | PRN
  Administered 2012-02-26: 1000 mL

## 2012-02-26 MED ORDER — SODIUM CHLORIDE 0.9 % IJ SOLN
3.0000 mL | Freq: Two times a day (BID) | INTRAMUSCULAR | Status: DC
  Administered 2012-02-26 – 2012-02-27 (×3): 3 mL via INTRAVENOUS

## 2012-02-26 MED ORDER — HYDROMORPHONE HCL PF 1 MG/ML IJ SOLN
INTRAMUSCULAR | Status: AC
  Administered 2012-02-26: 0.5 mg via INTRAVENOUS
  Filled 2012-02-26: qty 1

## 2012-02-26 MED ORDER — ACETAMINOPHEN 325 MG PO TABS: 650.0000 mg | ORAL_TABLET | ORAL | Status: DC | PRN

## 2012-02-26 MED ORDER — CYCLOBENZAPRINE HCL 10 MG PO TABS
10.0000 mg | ORAL_TABLET | Freq: Three times a day (TID) | ORAL | Status: DC | PRN
  Administered 2012-02-28: 10 mg via ORAL

## 2012-02-26 MED ORDER — SODIUM CHLORIDE 0.9 % IV SOLN
INTRAVENOUS | Status: AC
  Filled 2012-02-26: qty 500

## 2012-02-26 MED ORDER — LORAZEPAM 2 MG/ML IJ SOLN: 1.0000 mg | Freq: Once | INTRAMUSCULAR | Status: DC | PRN

## 2012-02-26 MED ORDER — NEOSTIGMINE METHYLSULFATE 1 MG/ML IJ SOLN
INTRAMUSCULAR | Status: DC | PRN
  Administered 2012-02-26: 4 mg via INTRAVENOUS

## 2012-02-26 MED ORDER — LACTATED RINGERS IV SOLN
INTRAVENOUS | Status: DC | PRN
  Administered 2012-02-26: 12:00:00 via INTRAVENOUS

## 2012-02-26 MED ORDER — HYDROXYZINE HCL 50 MG/ML IM SOLN
50.0000 mg | INTRAMUSCULAR | Status: DC | PRN
  Filled 2012-02-26: qty 1

## 2012-02-26 MED ORDER — MORPHINE SULFATE 4 MG/ML IJ SOLN: 4.0000 mg | INTRAMUSCULAR | Status: DC | PRN

## 2012-02-26 MED ORDER — THROMBIN 20000 UNITS EX SOLR
CUTANEOUS | Status: DC | PRN
  Administered 2012-02-26: 20000 [IU] via TOPICAL

## 2012-02-26 MED ORDER — KETOROLAC TROMETHAMINE 30 MG/ML IJ SOLN
30.0000 mg | Freq: Once | INTRAMUSCULAR | Status: AC
  Administered 2012-02-26: 30 mg via INTRAVENOUS

## 2012-02-26 MED ORDER — MAGNESIUM HYDROXIDE 400 MG/5ML PO SUSP: 30.0000 mL | Freq: Every day | ORAL | Status: DC | PRN

## 2012-02-26 MED ORDER — OXYCODONE-ACETAMINOPHEN 5-325 MG PO TABS
1.0000 | ORAL_TABLET | ORAL | Status: DC | PRN
  Administered 2012-02-27 – 2012-02-28 (×3): 2 via ORAL
  Filled 2012-02-26 (×3): qty 2

## 2012-02-26 MED ORDER — KCL IN DEXTROSE-NACL 20-5-0.45 MEQ/L-%-% IV SOLN
INTRAVENOUS | Status: DC
  Administered 2012-02-26 – 2012-02-27 (×2): via INTRAVENOUS
  Filled 2012-02-26 (×7): qty 1000

## 2012-02-26 MED ORDER — MIDAZOLAM HCL 5 MG/5ML IJ SOLN
INTRAMUSCULAR | Status: DC | PRN
  Administered 2012-02-26: 2 mg via INTRAVENOUS

## 2012-02-26 MED ORDER — ALUM & MAG HYDROXIDE-SIMETH 200-200-20 MG/5ML PO SUSP: 30.0000 mL | Freq: Four times a day (QID) | ORAL | Status: DC | PRN

## 2012-02-26 MED ORDER — SENNOSIDES-DOCUSATE SODIUM 8.6-50 MG PO TABS: 2.0000 | ORAL_TABLET | Freq: Every day | ORAL | Status: DC | PRN

## 2012-02-26 MED ORDER — PROPOFOL 10 MG/ML IV EMUL
INTRAVENOUS | Status: DC | PRN
  Administered 2012-02-26: 160 mg via INTRAVENOUS

## 2012-02-26 SURGICAL SUPPLY — 55 items
ADH SKN CLS APL DERMABOND .7 (GAUZE/BANDAGES/DRESSINGS) ×1
BAG DECANTER FOR FLEXI CONT (MISCELLANEOUS) ×2 IMPLANT
BIT DRILL NEURO 2X3.1 SFT TUCH (MISCELLANEOUS) ×1 IMPLANT
BLADE ULTRA TIP 2M (BLADE) ×2 IMPLANT
BRUSH SCRUB EZ PLAIN DRY (MISCELLANEOUS) ×4 IMPLANT
CANISTER SUCTION 2500CC (MISCELLANEOUS) ×2 IMPLANT
CLOTH BEACON ORANGE TIMEOUT ST (SAFETY) ×2 IMPLANT
CONT SPEC 4OZ CLIKSEAL STRL BL (MISCELLANEOUS) ×2 IMPLANT
COVER MAYO STAND STRL (DRAPES) ×2 IMPLANT
DECANTER SPIKE VIAL GLASS SM (MISCELLANEOUS) ×2 IMPLANT
DERMABOND ADVANCED (GAUZE/BANDAGES/DRESSINGS) ×1
DERMABOND ADVANCED .7 DNX12 (GAUZE/BANDAGES/DRESSINGS) ×1 IMPLANT
DRAPE LAPAROTOMY 100X72 PEDS (DRAPES) ×2 IMPLANT
DRAPE MICROSCOPE LEICA (MISCELLANEOUS) ×2 IMPLANT
DRAPE POUCH INSTRU U-SHP 10X18 (DRAPES) ×2 IMPLANT
DRAPE PROXIMA HALF (DRAPES) IMPLANT
DRILL NEURO 2X3.1 SOFT TOUCH (MISCELLANEOUS) ×2
DRSG EMULSION OIL 3X3 NADH (GAUZE/BANDAGES/DRESSINGS) ×2 IMPLANT
ELECT COATED BLADE 2.86 ST (ELECTRODE) ×2 IMPLANT
ELECT REM PT RETURN 9FT ADLT (ELECTROSURGICAL) ×2
ELECTRODE REM PT RTRN 9FT ADLT (ELECTROSURGICAL) ×1 IMPLANT
GLOVE BIO SURGEON STRL SZ 6.5 (GLOVE) ×1 IMPLANT
GLOVE BIO SURGEON STRL SZ7 (GLOVE) ×1 IMPLANT
GLOVE BIOGEL PI IND STRL 8 (GLOVE) ×1 IMPLANT
GLOVE BIOGEL PI INDICATOR 8 (GLOVE) ×1
GLOVE ECLIPSE 6.5 STRL STRAW (GLOVE) ×2 IMPLANT
GLOVE ECLIPSE 7.5 STRL STRAW (GLOVE) ×2 IMPLANT
GLOVE ECLIPSE 8.5 STRL (GLOVE) ×2 IMPLANT
GLOVE EXAM NITRILE LRG STRL (GLOVE) IMPLANT
GLOVE EXAM NITRILE MD LF STRL (GLOVE) IMPLANT
GLOVE EXAM NITRILE XL STR (GLOVE) IMPLANT
GLOVE EXAM NITRILE XS STR PU (GLOVE) IMPLANT
GOWN BRE IMP SLV AUR LG STRL (GOWN DISPOSABLE) ×2 IMPLANT
GOWN BRE IMP SLV AUR XL STRL (GOWN DISPOSABLE) ×2 IMPLANT
GOWN STRL REIN 2XL LVL4 (GOWN DISPOSABLE) IMPLANT
GRAFT CORT CANC 14X8.25X11 5D (Bone Implant) ×2 IMPLANT
HEAD HALTER (SOFTGOODS) ×2 IMPLANT
KIT BASIN OR (CUSTOM PROCEDURE TRAY) ×2 IMPLANT
KIT ROOM TURNOVER OR (KITS) ×2 IMPLANT
NDL SPNL 22GX3.5 QUINCKE BK (NEEDLE) ×1 IMPLANT
NEEDLE HYPO 25X1 1.5 SAFETY (NEEDLE) ×2 IMPLANT
NEEDLE SPNL 22GX3.5 QUINCKE BK (NEEDLE) ×2 IMPLANT
NS IRRIG 1000ML POUR BTL (IV SOLUTION) ×2 IMPLANT
PACK LAMINECTOMY NEURO (CUSTOM PROCEDURE TRAY) ×2 IMPLANT
PAD ARMBOARD 7.5X6 YLW CONV (MISCELLANEOUS) ×6 IMPLANT
RUBBERBAND STERILE (MISCELLANEOUS) ×4 IMPLANT
SPONGE INTESTINAL PEANUT (DISPOSABLE) ×2 IMPLANT
SPONGE SURGIFOAM ABS GEL SZ50 (HEMOSTASIS) ×2 IMPLANT
STAPLER SKIN PROX WIDE 3.9 (STAPLE) ×1 IMPLANT
SUT VIC AB 2-0 CP2 18 (SUTURE) ×2 IMPLANT
SUT VIC AB 3-0 SH 8-18 (SUTURE) ×3 IMPLANT
SYR 20ML ECCENTRIC (SYRINGE) ×2 IMPLANT
TOWEL OR 17X24 6PK STRL BLUE (TOWEL DISPOSABLE) ×2 IMPLANT
TOWEL OR 17X26 10 PK STRL BLUE (TOWEL DISPOSABLE) ×2 IMPLANT
WATER STERILE IRR 1000ML POUR (IV SOLUTION) ×2 IMPLANT

## 2012-02-26 NOTE — Progress Notes (Signed)
Filed Vitals:   02/26/12 1041  BP: 122/74  Pulse: 97  Temp: 98.5 F (36.9 C)  Resp: 20  SpO2: 99%    CBC  Basename 02/24/12 1105  WBC 6.7  HGB 11.9*  HCT 34.7*  PLT 216   BMET  Basename 02/24/12 1105  NA 140  K 3.5  CL 103  CO2 29  GLUCOSE 93  BUN 8  CREATININE 0.66  CALCIUM 9.0    Patient just arrived in PACU, comfortable, placed in soft cervical collar. Moving all 4 extremities to command. Wound clean and dry. No voided yet.  In the operating room, following surgery, the patient developed bleeding, bruising, and ecchymosis around the eyelids as the anesthesia service removed the tape that they had applied to keep the lids closed, and the eyes protected, during the surgical procedure. We applied Adaptic gauze, and applied gentle pressure, and the bleeding was stopped.   Plan: Patient stable following 2 level ACDF. Once recovered in the PACU to be transferred to the 3000 units, and progressive postoperative course, with ambulation the halls, etc.  Hewitt Shorts, MD 02/26/2012, 3:28 PM

## 2012-02-26 NOTE — Anesthesia Postprocedure Evaluation (Signed)
  Anesthesia Post-op Note  Patient: Brent Allison  Procedure(s) Performed: Procedure(s) (LRB): ANTERIOR CERVICAL DECOMPRESSION/DISCECTOMY FUSION 2 LEVELS (N/A)  Patient Location: PACU  Anesthesia Type: General  Level of Consciousness: awake and alert   Airway and Oxygen Therapy: Patient Spontanous Breathing  Post-op Pain: mild  Post-op Assessment: Post-op Vital signs reviewed, Patient's Cardiovascular Status Stable, Respiratory Function Stable, Patent Airway, No signs of Nausea or vomiting and Pain level controlled  Post-op Vital Signs: stable  Complications: pt has ecchymotic, weeping areas on both eyelids from tape applied

## 2012-02-26 NOTE — Anesthesia Preprocedure Evaluation (Addendum)
Anesthesia Evaluation  Patient identified by MRN, date of birth, ID band Patient awake    Reviewed: Allergy & Precautions, H&P , NPO status , Patient's Chart, lab work & pertinent test results  Airway Mallampati: II TM Distance: >3 FB Neck ROM: Full    Dental  (+) Teeth Intact   Pulmonary sleep apnea and Continuous Positive Airway Pressure Ventilation ,    Pulmonary exam normal       Cardiovascular  Pt has Hx of DVTs. IVC filter placed yesterday   Neuro/Psych Spastic quad Multiple myeloma hx  Neuromuscular disease    GI/Hepatic negative GI ROS, Neg liver ROS,   Endo/Other  negative endocrine ROS  Renal/GU negative Renal ROS     Musculoskeletal negative musculoskeletal ROS (+)   Abdominal   Peds  Hematology Pt currently being treated with Chemo for multiple myloma   Anesthesia Other Findings   Reproductive/Obstetrics negative OB ROS                         Anesthesia Physical Anesthesia Plan  ASA: III  Anesthesia Plan: General   Post-op Pain Management:    Induction: Intravenous  Airway Management Planned: Oral ETT and Video Laryngoscope Planned  Additional Equipment:   Intra-op Plan:   Post-operative Plan: Extubation in OR  Informed Consent: I have reviewed the patients History and Physical, chart, labs and discussed the procedure including the risks, benefits and alternatives for the proposed anesthesia with the patient or authorized representative who has indicated his/her understanding and acceptance.     Plan Discussed with: CRNA and Surgeon  Anesthesia Plan Comments:         Anesthesia Quick Evaluation

## 2012-02-26 NOTE — Op Note (Signed)
02/26/2012  2:57 PM  PATIENT:  Brent Allison  66 y.o. male  PRE-OPERATIVE DIAGNOSIS:  Cervical stenosis, cervical spondylosis with myelopathy, cervical hnp with myelopathy, Quadriparesis  POST-OPERATIVE DIAGNOSIS:  Cervical stenosis, cervical spondylosis with myelopathy, cervical hnp with myelopathy, Quadriparesis  PROCEDURE:  Procedure(s): ANTERIOR CERVICAL DECOMPRESSION/DISCECTOMY FUSION 2 LEVELS: C3-4 and C4-5 anterior cervical decompression and arthrodesis with allograft and tether cervical plating  SURGEON:  Surgeon(s): Hewitt Shorts, MD Temple Pacini, MD  ASSISTANTS: Temple Pacini, M.D.  ANESTHESIA:   general  EBL:  Total I/O In: 1800 [I.V.:1300; IV Piggyback:500] Out: 100 [Blood:100]  BLOOD ADMINISTERED:none  COUNT: Correct per nursing staff  DICTATION: Patient was brought to the operating room placed under general endotracheal anesthesia. Patient was placed in 10 pounds of halter traction. The neck was prepped with Betadine soap and solution and draped in a sterile fashion. A horizontal incision was made on the left side of the neck. The line of the incision was infiltrated with local anesthetic with epinephrine. Dissection was carried down thru the subcutaneous tissue and platysma, bipolar cautery was used to maintain hemostasis. Dissection was then carried out thru an avascular plane leaving the sternocleidomastoid carotid artery and jugular vein laterally and the trachea and esophagus medially. The ventral aspect of the vertebral column was identified and a localizing x-ray was taken. The C3-4 and C4-5 levels were identified. The annulus at each level was incised and the disc space entered. Discectomy was performed with micro-curettes and pituitary rongeurs. The operating microscope was draped and brought into the field provided additional magnification illumination and visualization. Discectomy was continued posteriorly thru the disc space and then the cartilaginous  endplate was removed using micro-curettes along with the high-speed drill. Posterior osteophytic overgrowth was removed each level using the high-speed drill along with a 2 mm thin footplated Kerrison punch. Posterior longitudinal ligament along with disc herniation was carefully removed, decompressing the spinal canal and thecal sac. We then continued to remove osteophytic overgrowth and disc material decompressing the neural foramina and exiting nerve roots bilaterally. Once the decompression was completed hemostasis was established at each level with the use of Gelfoam with thrombin and bipolar cautery. The Gelfoam was removed the wound irrigated and hemostasis confirmed. We then measured the height of the intravertebral disc space level and selected a 8 millimeter in height structural allograft for the C3-4 level and a 8 millimeter in height structural allograft for the C4-5 level . Each was hydrated and saline solution and then gently positioned in the intravertebral disc space and countersunk. We then selected a 35 millimeter in height Tether cervical plate. It was positioned over the fusion construct and secured to the vertebra with 4 x 14 mm screws. We used a pair of variable screws at C3, a fixed screw at C4, and a pair variable screws at C5. Each screw hole was started with the high-speed drill and then the screws placed, once all the screws were placed final tightening was performed. The wound was irrigated with bacitracin solution checked for hemostasis which was established and confirmed. An x-ray was taken which showed the grafts to be in good position, the plate and screws in good position, and the overall alignment to be good. We then proceeded with closure. The platysma was closed with interrupted inverted 2-0 undyed Vicryl suture, the subcutaneous and subcuticular closed with interrupted inverted 3-0 undyed Vicryl suture. The skin edges were approximated with Dermabond. Following surgery the  patient was taken out of cervical traction.  To be reversed and the anesthetic and taken to the recovery room for further care.   PLAN OF CARE: Admit to inpatient   PATIENT DISPOSITION:  PACU - hemodynamically stable.   Delay start of Pharmacological VTE agent (>24hrs) due to surgical blood loss or risk of bleeding:  yes

## 2012-02-26 NOTE — Transfer of Care (Signed)
Immediate Anesthesia Transfer of Care Note  Patient: Brent Allison  Procedure(s) Performed: Procedure(s) (LRB): ANTERIOR CERVICAL DECOMPRESSION/DISCECTOMY FUSION 2 LEVELS (N/A)  Patient Location: PACU  Anesthesia Type: General  Level of Consciousness: oriented, sedated, patient cooperative and responds to stimulation  Airway & Oxygen Therapy: Patient Spontanous Breathing and Patient connected to face mask oxygen  Post-op Assessment: Report given to PACU RN, Post -op Vital signs reviewed and stable, Patient moving all extremities and Patient moving all extremities X 4  Post vital signs: Reviewed and stable  Complications: No apparent anesthesia complications

## 2012-02-26 NOTE — H&P (Signed)
Subjective: Patient is a 66 y.o. male who is admitted for treatment of 5-6 month history of progressive cervical myelopathy with quadriparesis and specificity and hyperreflexia and diminished sensation, overall weaker and number in the upper as compared to the lower extremities. He has severe stenosis at C3-4 with increased signal within the spinal cord as well as significant stenosis at C4-5, with more mild degenerative changes seen at lower levels.  Patient's history is notable for multiple myeloma diagnosed in August 2012, associated with amyloidosis that had presented with bruising around his eyes. He's been under treatment with several different chemotherapy regimens currently treated with Revlimid, he also been treated with steroids for 6-7 months but that was stopped about a month ago. In fact neurologically since his began 5-6 months ago and gradually worsened. He describes lack of feeling in his hands and limited feeling in his torso and lower extremities.  His history is also notable for having developed swelling in his right ankle last month. Venous Dopplers revealed DVT in the right lower extremities, and he was started on Lovenox and converted to Coumadin, both findings of a cervical MRI last week he was converted back to Lovenox which was stopped 2 days ago. We had a IVC filter placed 2 days ago in anticipation of surgery today.  He is admitted now for 2 level C3-4 C4-5 anterior cervical decompression and arthrodesis with allograft and cervical plating.  Patient Active Problem List  Diagnoses Date Noted  . DVT (deep venous thrombosis) 02/02/2012  . Amyloidosis 10/01/2011  . Multiple myeloma 10/01/2011  . Neuropathy 10/01/2011  . Sleep apnea   . Dyslipidemia    Past Medical History  Diagnosis Date  . Prostate cancer   . Sleep apnea   . Dyslipidemia   . H/O multiple myeloma   . Spastic quadriparesis   . DVT (deep venous thrombosis)     Past Surgical History  Procedure Date  .  Prostatectomy   . Posterior laminectomy / decompression lumbar spine     L4-5  . Carpal tunnel release 2009    left  . Tonsillectomy     Prescriptions prior to admission  Medication Sig Dispense Refill  . acyclovir (ZOVIRAX) 400 MG tablet Take 1 tablet (400 mg total) by mouth 2 (two) times daily.  60 tablet  3  . aspirin 81 MG tablet Take 81 mg by mouth daily.        Marland Kitchen ibuprofen (ADVIL,MOTRIN) 600 MG tablet Take 600 mg by mouth every 6 (six) hours as needed. As needed for pain.      Marland Kitchen lenalidomide (REVLIMID) 15 MG capsule Take 1 capsule (15 mg total) by mouth daily. Auth # [id number] [employer]  21 capsule  0  . sennosides-docusate sodium (SENOKOT-S) 8.6-50 MG tablet Take 2 tablets by mouth daily as needed. As needed for constipation.      Marland Kitchen warfarin (COUMADIN) 5 MG tablet Take 5 mg by mouth as directed. Take 5mg  daily except 7.5mg  on MWF.        No Known Allergies  History  Substance Use Topics  . Smoking status: Never Smoker   . Smokeless tobacco: Not on file  . Alcohol Use: Yes     occ    History reviewed. No pertinent family history.   Review of Systems A comprehensive review of systems was negative. except for those difficulties described in the history present illness and past medical history.  Objective: Vital signs in last 24 hours: Temp:  [98.5 F (36.9 C)]  98.5 F (36.9 C) (04/06 1041) Pulse Rate:  [97] 97  (04/06 1041) Resp:  [20] 20  (04/06 1041) BP: (122)/(74) 122/74 mmHg (04/06 1041) SpO2:  [99 %] 99 % (04/06 1041)  EXAM: Patient well-developed well-nourished white male in no acute distress. Lungs clear to auscultation. He has symmetrical respiratory excursion. Breath sounds are somewhat distant. Heart has a regular rate and rhythm, normal S1-S2, no murmur, again heart sounds are somewhat distant. Abdomen is soft nontender nondistended bowel sounds are present. Extremity examination shows mild to moderate edema in the right leg and foot, no edema in the left leg or  foot, no clubbing or cyanosis. Musculoskeletal examination shows decreased range of motion neck flexion extension as well as lateral flexion to either side. There is no tenderness to palpation of the cervical spinous processes.  Neurologic exam: Motor exam shows quadrant parapsoas. Deltoid is 5 bilaterally biceps for the left and 4 minus the right. Triceps is 4 minus bilaterally. Intrinsics are 4 bilaterally. Grips are 4 minus bilaterally. Iliopsoas is 4 minus bilaterally. Quadriceps, dorsiflexor, extensor hallucis longus, and plantar flexor are all 5 over 5 bilaterally. Toes are downgoing bilaterally. He does have a Hoffman on the right, but negative Hoffman on the left. Gait and stance show mild stiffness and spasticity.  Data Review:CBC    Component Value Date/Time   WBC 6.7 02/24/2012 1105   WBC 4.1 02/21/2012 0839   RBC 3.76* 02/24/2012 1105   RBC 3.78* 02/21/2012 0839   HGB 11.9* 02/24/2012 1105   HGB 12.1* 02/21/2012 0839   HCT 34.7* 02/24/2012 1105   HCT 35.9* 02/21/2012 0839   PLT 216 02/24/2012 1105   PLT 167 02/21/2012 0839   MCV 92.3 02/24/2012 1105   MCV 95.0 02/21/2012 0839   MCH 31.6 02/24/2012 1105   MCH 32.1 02/21/2012 0839   MCHC 34.3 02/24/2012 1105   MCHC 33.8 02/21/2012 0839   RDW 13.6 02/24/2012 1105   RDW 14.7* 02/21/2012 0839   LYMPHSABS 1.3 02/21/2012 0839   LYMPHSABS 1.7 07/29/2011 0808   MONOABS 0.9 02/21/2012 0839   MONOABS 0.8 07/29/2011 0808   EOSABS 0.4 02/21/2012 0839   EOSABS 0.1 07/29/2011 0808   BASOSABS 0.1 02/21/2012 0839   BASOSABS 0.0 07/29/2011 0808                          BMET    Component Value Date/Time   NA 140 02/24/2012 1105   K 3.5 02/24/2012 1105   CL 103 02/24/2012 1105   CO2 29 02/24/2012 1105   GLUCOSE 93 02/24/2012 1105   BUN 8 02/24/2012 1105   CREATININE 0.66 02/24/2012 1105   CALCIUM 9.0 02/24/2012 1105   GFRNONAA >90 02/24/2012 1105   GFRAA >90 02/24/2012 1105     Assessment/Plan: Patient with multiple medical comorbidities including multiple myeloma, history of prostate cancer,  recent diagnosis about 3 weeks ago of right lower extremity deep vein thrombosis, for which he had been anticoagulated, however that has been discontinued and we had an IVC filter placed 2 days ago.  He is a 5-6 month history of progressive cervical myelopathy with quadrant parapsoas and spasticity hyperreflexia and diminished sensation, overall weaker and more numb in the upper as compared to the lower extremities. There is severe stenosis at C3-4 with increased signal in the spinal cord as well as significant stenosis at C4-5. There are more mild degenerative changes seen at C2-3, C5-6, and C6-7.  He is admitted  now for 2 level CIII-4 and C4-5 anterior cervical decompression and arthrodesis with allograft and tether cervical plating. I've discussed with the patient the nature of his condition, the nature the surgical procedure, the typical length of surgery, hospital stay, and overall recuperation. We discussed limitations postoperatively. I discussed risks of surgery including risks of infection, bleeding, possibly need for transfusion, the risk of nerve root dysfunction with pain, weakness, numbness, or paresthesias, the risk of spinal cord dysfunction with paralysis of all 4 limbs and quadriplegia, and the risk of dural tear and CSF leakage and possible need for further surgery, the risk of esophageal dysfunction causing dysphagia and the risk of laryngeal dysfunction causing hoarseness of the voice, the risk of failure of the arthrodesis and the possible need for further surgery, and the risk of anesthetic complications including myocardial infarction, stroke, pneumonia, and death. We also discussed the need for postoperative immobilization in a cervical collar. Understanding all this the patient does wish to proceed with surgery and is admitted for such.   Hewitt Shorts, MD 02/26/2012 11:48 AM

## 2012-02-27 ENCOUNTER — Encounter (HOSPITAL_COMMUNITY): Payer: Self-pay | Admitting: *Deleted

## 2012-02-27 NOTE — Progress Notes (Signed)
Postop day 1. No current complaints. He states that his hands feel about the same. He denies any pain. He has been able to void without difficulty. He has not been up out of bed however.  He is afebrile. His vitals are stable. His intake and output aren't supple. His wound is clean and dry. Neck is not swollen. Motor examination reveals some moderate intrinsic weakness and overall spasticity but no focal deficits. Chest and abdomen benign.  Doing well following anterior cervical decompression and fusion. We'll mobilize today with therapy. Patient does not feel able to go home today but thinks he will be able to leave tomorrow.

## 2012-02-28 ENCOUNTER — Encounter (HOSPITAL_COMMUNITY): Payer: Self-pay | Admitting: Neurosurgery

## 2012-02-28 MED ORDER — HYDROCODONE-ACETAMINOPHEN 5-325 MG PO TABS: 1.0000 | ORAL_TABLET | ORAL | Status: AC | PRN

## 2012-02-28 NOTE — Discharge Summary (Signed)
Physician Discharge Summary  Patient ID: Brent Allison MRN: [id number] DOB/AGE: 02-04-1946 66 y.o.  Admit date: 02/26/2012 Discharge date: 02/28/2012  Admission Diagnoses: Cervical stenosis, cervical spondylosis with myelopathy, cervical disc herniation with myelopathy, quadriparesis  Discharge Diagnoses: Cervical stenosis, cervical spondylosis with myelopathy, cervical disc herniation with myelopathy, quadriparesis  Discharged Condition: good  Hospital Course: Patient was admitted underwent a 2 level C3-4 and C4-5 anterior cervical decompression and arthrodesis with allograft and tether cervical plating. Postoperatively he has done well. He is up and ambulating in the halls. His wound is clean and dry. He did develop significant injury to the skin including ecchymosis in and around the eyes on the lids from there having been taped by the anesthesia service for the surgery. Postoperatively he has been up and ambulating in the halls. He is being discharged to home with instructions regarding wound care and activities. He is return for followup with me in 3 weeks. He is to resume his Lovenox in one week. He is to followup belly with me but also with Dr. Mancel Bale, M.D. his medical oncologist.  Discharge Exam: Blood pressure 130/79, pulse 77, temperature 98.5 F (36.9 C), temperature source Oral, resp. rate 20, height 6\' 1"  (1.854 m), weight 94.405 kg (208 lb 2 oz), SpO2 94.00%.  Disposition: Home  Discharge Orders    Future Appointments: Provider: Department: Dept Phone: Center:   03/06/2012 11:00 AM Ladene Artist, MD [facility] [phone] None     Medication List  As of 02/28/2012 11:57 AM   STOP taking these medications         warfarin 5 MG tablet         TAKE these medications         acyclovir 400 MG tablet   Commonly known as: ZOVIRAX   Take 1 tablet (400 mg total) by mouth 2 (two) times daily.      aspirin 81 MG tablet   Take 81 mg by mouth daily.     HYDROcodone-acetaminophen 5-325 MG per tablet   Commonly known as: NORCO   Take 1-2 tablets by mouth every 4 (four) hours as needed for pain.      ibuprofen 600 MG tablet   Commonly known as: ADVIL,MOTRIN   Take 600 mg by mouth every 6 (six) hours as needed. As needed for pain.      lenalidomide 15 MG capsule   Commonly known as: REVLIMID   Take 1 capsule (15 mg total) by mouth daily. Auth # [id number]  [employer]      sennosides-docusate sodium 8.6-50 MG tablet   Commonly known as: SENOKOT-S   Take 2 tablets by mouth daily as needed. As needed for constipation.             Signed: Hewitt Shorts, MD 02/28/2012, 11:57 AM

## 2012-02-29 ENCOUNTER — Telehealth: Payer: Self-pay | Admitting: *Deleted

## 2012-02-29 NOTE — Telephone Encounter (Signed)
Patient called asking when to resume Revlimid ? Also asking when to resume Lovenox 130 mg sq daily? Surgical date was 02/26/12 and Dr. Newell Coral told him to hold the Lovenox for 2 days after surgery. Would be due to resume it again today. Wants to clarify with Dr. Truett Perna if he should resume Lovenox today?  Confirms next appointment with Dr. Truett Perna is 03/06/12.  Informed patient that MD said that he wants to hold Revlimid until after he is seen. He understands and agrees.

## 2012-03-01 ENCOUNTER — Other Ambulatory Visit: Payer: Medicare Other

## 2012-03-01 ENCOUNTER — Telehealth: Payer: Self-pay | Admitting: *Deleted

## 2012-03-01 NOTE — Telephone Encounter (Signed)
Call from pt reporting he resumed his Lovenox this morning but noticed his ankle is swollen. Asking if he needs to do anything about this. Reviewed with Dr. Truett Perna: Would just monitor ankle for now. Check with Dr. Newell Coral re: resuming Lovenox. Dr. Truett Perna thinks he wanted to hold it for 10-14 days. Left message on voicemail for Dr. Earl Gala office to call pt with anticoagulation instructions.

## 2012-03-01 NOTE — Telephone Encounter (Signed)
Spoke with pt, he was instructed by Dr. Earl Gala office to resume Lovenox on 03/06/12.

## 2012-03-06 ENCOUNTER — Ambulatory Visit (HOSPITAL_BASED_OUTPATIENT_CLINIC_OR_DEPARTMENT_OTHER): Payer: Medicare Other | Admitting: Oncology

## 2012-03-06 ENCOUNTER — Telehealth: Payer: Self-pay | Admitting: Oncology

## 2012-03-06 DIAGNOSIS — I82409 Acute embolism and thrombosis of unspecified deep veins of unspecified lower extremity: Secondary | ICD-10-CM

## 2012-03-06 DIAGNOSIS — E8589 Other amyloidosis: Secondary | ICD-10-CM

## 2012-03-06 NOTE — Progress Notes (Signed)
OFFICE PROGRESS NOTE   INTERVAL HISTORY:   He returns as scheduled. He underwent cervical spine surgery on 02/19/2012. He was discharged on 02/21/2012. He has noted a marked improvement in the neuropathy symptoms. The hands are less numb and the ataxia has improved. He is scheduled to resume Lovenox today.  He complains of anorexia since surgery. No nausea or difficulty with bowel/bladder function. The postoperative neck swelling is improving.  Objective:  Vital signs in last 24 hours:  Blood pressure 127/65, pulse 109, temperature 97.3 F (36.3 C), temperature source Oral, height 6\' 1"  (1.854 m), weight 189 lb (85.73 kg).    HEENT: Macroglossia, no thrush, healed incision at the left anterior neck. Resp: Lungs clear bilaterally Cardio: Regular rate and rhythm GI: Nontender, no hepatomegaly Vascular: No leg edema Neuro: Alert and oriented. He ambulates without difficulty. The motor exam appears intact in the arms and hands bilaterally  Skin: Small ecchymoses around the eyes   Labs: Lambda free light chains 16.6 on 02/18/2012, 12.5 on 02/02/2012   Medications: I have reviewed the patient's current medications.  Assessment/Plan: 1. 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 2. "Bruising" at the eyelids and mouth: Likely related to amyloidosis, persistent. 3. Numbness and loss of vibratory sense at the fingertips: This predated Velcade-based therapy, but has worsened. 4. Elevated serum free lambda light chains. The lambda light chains were lower on November 16 and slightly higher on 12/14/2011. 5. Lambda light chain proteinuria. 6. Bone marrow plasmacytosis: Variable increase in plasma cells noted on the bone marrow biopsy 07/29/2011 with plasma cells estimated to represent between 4% and 20% of the cellular population. 7. Remote history of prostate cancer. 8. Sleep apnea. 9. Dyslipidemia. 10. Report of pneumonia on 2 occasions in 2011. 11. Admission to a hospital in  [place], [place] October 2012 with "pneumonia." A chest x-ray at [facility] on November 2 was negative .  12. Low serum immunoglobulin G level. 13. Borderline-low hemoglobin level, improved. 14. Plasma cell dyscrasia with associated amyloidosis.  a. Initiation of systemic therapy with Cytoxan, Velcade, and Decadron 08/12/2011. Cycle #2 was initiated on 09/16/2011. Cycle #3 was initiated on 10/15/2011. Velcade was placed on hold due to 2 neuropathy. He continues weekly Cytoxan/Decadron b. The serum free lambda light chains were decreased on October 08 2011-7/21 2012. c. The serum free lambda light chains were slightly increased on 12/14/2011, lower on 12/29/2011 and 02/02/2012 d. Initiation of Revlimid/Decadron February 2013, cycle 2 started on 01/28/2012 15. Loss of "balance "and proximal motor weakness secondary to cervical stenosis-status post decompression surgery on 02/19/2012. Improved. 16. Right lower showed a deep vein thrombosis-a Doppler ultrasound confirmed a gastrocnemius and peroneal deep vein thrombosis. An IVC filter was placed prior to surgery and he is scheduled to resume Lovenox today.  17. Anorexia-etiology unclear. He will try to increase the use of nutritional supplements.  Disposition:  He underwent a cervical spine decompression and fusion procedure on 02/19/2012. He has noted significant improvement in the weakness, gait ataxia, and hand numbness since this procedure. He will see Dr. Newell Coral within the next one to 2 weeks to decide on the need for additional surgery. The plan is to begin Coumadin anticoagulation if further surgery is not planned.  Treatment with amyloidosis remains on hold. He will begin another cycle of Revlimid if there is no plan for further surgery. He will return for an office visit here on 03/16/2012.  Lucile Shutters, MD  03/06/2012  1:57 PM

## 2012-03-06 NOTE — Telephone Encounter (Signed)
Gv pt appt for april2013 °

## 2012-03-07 ENCOUNTER — Ambulatory Visit: Payer: Medicare Other | Admitting: Nutrition

## 2012-03-07 ENCOUNTER — Telehealth: Payer: Self-pay | Admitting: Oncology

## 2012-03-07 NOTE — Assessment & Plan Note (Signed)
REASON FOR ASSESSMENT:  [name] is 66 year old male patient of Dr. Truett Perna diagnosed with multiple myeloma.  MEDICAL HISTORY INCLUDES:  Status post cervical spine surgery, prostate cancer, sleep apnea, dyslipidemia, DVT, pneumonia and arthritis.  MEDICATIONS INCLUDE:  Compazine, Senokot, and Revlimid.  LABS:  Reviewed.  HEIGHT:  6 feet 1 inch. WEIGHT:  189 pounds. USUAL BODY WEIGHT:  The patient was 205 pounds October of 2012. BMI:  24.94.  PATIENT STATES:  Complaint of swollen throat and some difficulty swallowing.  However, does not really complain of soreness.  He does have anorexia and taste alteration.  He typically consumes 3 meals daily and is not snacking in between.  NUTRITION DIAGNOSIS:  Inadequate oral intake related to diagnosis of multiple myeloma and recent cervical spine surgery resulting in anorexia and taste alterations as evidenced by 8% weight loss over the last 6 months.  INTERVENTION:  I educated the patient and his wife on strategies for increasing calories and protein throughout the day.  I have encouraged the patient to try [employer]entials mixed with milk between meals as tolerated.  I have also provided him with some samples of other nutrition supplements  that he might enjoy. I have encouraged him to snack on foods that he tolerates in order to increase total calories to minimize further weight loss.  I have also given him strategies on helping with taste alterations.  I provided fact sheets and my contact information for questions or concerns.  MONITORING/EVALUATION (GOALS):  That patient will tolerate increased oral intake to promote weight maintenance and adequate healing.  NEXT VISIT:  There is no followup scheduled.  The patient will call if he has questions or concerns.    ______________________________ Zenovia Jarred, RD, LDN Clinical Nutrition Specialist BN/MEDQ  D:  03/07/2012  T:  03/07/2012  Job:  [id number]

## 2012-03-07 NOTE — Telephone Encounter (Signed)
pt called and r/s appt on 04/25 to 04/30

## 2012-03-16 ENCOUNTER — Ambulatory Visit: Payer: Medicare Other | Admitting: Nurse Practitioner

## 2012-03-21 ENCOUNTER — Telehealth: Payer: Self-pay | Admitting: *Deleted

## 2012-03-21 ENCOUNTER — Ambulatory Visit: Payer: Medicare Other | Admitting: Nurse Practitioner

## 2012-03-21 NOTE — Telephone Encounter (Signed)
Saw Dr. Jule Ser today and will need repeat MRI and to be seen again on 03/29/12. Thinks appointment today at [facility] should be rescheduled. May be having further surgery for his neck. Dr. Truett Perna notified.

## 2012-03-22 ENCOUNTER — Telehealth: Payer: Self-pay | Admitting: Oncology

## 2012-03-22 ENCOUNTER — Telehealth: Payer: Self-pay | Admitting: *Deleted

## 2012-03-22 NOTE — Telephone Encounter (Signed)
Yes, continue lovenox Can transition to coumadin if decision is for no further surgery Needs f/u appt. After he sees Dr. Newell Coral

## 2012-03-22 NOTE — Telephone Encounter (Signed)
called pt lmovm for appt on 05/07. asked pt to rtn call to confirm appt

## 2012-03-22 NOTE — Telephone Encounter (Signed)
Pt is asking if he should remain on Lovenox? Took last dose on 03/21/12. Scheduled to have MRI to see if he will need another surgery.

## 2012-03-23 ENCOUNTER — Other Ambulatory Visit: Payer: Self-pay | Admitting: *Deleted

## 2012-03-23 MED ORDER — ENOXAPARIN SODIUM 150 MG/ML ~~LOC~~ SOLN: 130.0000 mg | Freq: Every day | SUBCUTANEOUS | Status: DC

## 2012-03-23 NOTE — Telephone Encounter (Signed)
Notified patient that MD wants him to continue Lovenox until we know for sure if he is having surgery again.

## 2012-03-27 ENCOUNTER — Telehealth: Payer: Self-pay | Admitting: *Deleted

## 2012-03-27 ENCOUNTER — Other Ambulatory Visit: Payer: Self-pay | Admitting: Neurosurgery

## 2012-03-27 DIAGNOSIS — M4802 Spinal stenosis, cervical region: Secondary | ICD-10-CM

## 2012-03-27 NOTE — Telephone Encounter (Signed)
Appointment with Dr. Jule Ser has been moved to 03/30/12 with no news about his MRI yet--in discussion with his insurance company. Asking if his appointment with Dr. Truett Perna needs to be rescheduled again?  Yes, per Dr. Truett Perna. Rescheduled for 04/05/12 at 10am.

## 2012-03-28 ENCOUNTER — Ambulatory Visit
Admission: RE | Admit: 2012-03-28 | Discharge: 2012-03-28 | Disposition: A | Discharge status: Home / Self Care | Payer: Medicare Other | Source: Ambulatory Visit | Attending: Neurosurgery | Admitting: Neurosurgery

## 2012-03-28 ENCOUNTER — Ambulatory Visit: Payer: Medicare Other | Admitting: Nurse Practitioner

## 2012-03-28 DIAGNOSIS — M4802 Spinal stenosis, cervical region: Secondary | ICD-10-CM

## 2012-03-30 ENCOUNTER — Encounter (HOSPITAL_COMMUNITY): Payer: Self-pay | Admitting: Pharmacy Technician

## 2012-03-30 ENCOUNTER — Other Ambulatory Visit: Payer: Self-pay | Admitting: Neurosurgery

## 2012-03-31 ENCOUNTER — Encounter (HOSPITAL_COMMUNITY): Payer: Self-pay | Admitting: *Deleted

## 2012-03-31 ENCOUNTER — Emergency Department (HOSPITAL_COMMUNITY)
Admission: EM | Admit: 2012-03-31 | Discharge: 2012-03-31 | Disposition: A | Discharge status: Home / Self Care | Payer: Medicare Other | Attending: Emergency Medicine | Admitting: Emergency Medicine

## 2012-03-31 ENCOUNTER — Emergency Department (HOSPITAL_COMMUNITY): Payer: Medicare Other

## 2012-03-31 DIAGNOSIS — J984 Other disorders of lung: Secondary | ICD-10-CM | POA: Insufficient documentation

## 2012-03-31 DIAGNOSIS — R222 Localized swelling, mass and lump, trunk: Secondary | ICD-10-CM

## 2012-03-31 DIAGNOSIS — K5730 Diverticulosis of large intestine without perforation or abscess without bleeding: Secondary | ICD-10-CM | POA: Insufficient documentation

## 2012-03-31 DIAGNOSIS — M545 Low back pain, unspecified: Secondary | ICD-10-CM | POA: Insufficient documentation

## 2012-03-31 DIAGNOSIS — K8020 Calculus of gallbladder without cholecystitis without obstruction: Secondary | ICD-10-CM | POA: Insufficient documentation

## 2012-03-31 DIAGNOSIS — E785 Hyperlipidemia, unspecified: Secondary | ICD-10-CM | POA: Insufficient documentation

## 2012-03-31 DIAGNOSIS — R319 Hematuria, unspecified: Secondary | ICD-10-CM | POA: Insufficient documentation

## 2012-03-31 DIAGNOSIS — R109 Unspecified abdominal pain: Secondary | ICD-10-CM | POA: Insufficient documentation

## 2012-03-31 DIAGNOSIS — G4730 Sleep apnea, unspecified: Secondary | ICD-10-CM | POA: Insufficient documentation

## 2012-03-31 LAB — COMPREHENSIVE METABOLIC PANEL
ALT: 10 U/L (ref 0–53)
AST: 12 U/L (ref 0–37)
Albumin: 3.3 g/dL — ABNORMAL LOW (ref 3.5–5.2)
Alkaline Phosphatase: 70 U/L (ref 39–117)
BUN: 16 mg/dL (ref 6–23)
CO2: 28 mEq/L (ref 19–32)
Calcium: 9.9 mg/dL (ref 8.4–10.5)
Chloride: 107 mEq/L (ref 96–112)
Creatinine, Ser: 0.94 mg/dL (ref 0.50–1.35)
GFR calc Af Amer: >90 mL/min (ref >90)
GFR calc non Af Amer: 85 mL/min — ABNORMAL LOW (ref >90)
Glucose, Bld: 79 mg/dL (ref 70–99)
Potassium: 4.2 mEq/L (ref 3.5–5.1)
Sodium: 144 mEq/L (ref 135–145)
Total Bilirubin: 0.3 mg/dL (ref 0.3–1.2)
Total Protein: 6.2 g/dL (ref 6.0–8.3)

## 2012-03-31 LAB — URINALYSIS, ROUTINE W REFLEX MICROSCOPIC
Glucose, UA: NEGATIVE mg/dL
Nitrite: NEGATIVE
Protein, ur: 100 mg/dL — AB
Specific Gravity, Urine: 1.018 (ref 1.005–1.030)
Urobilinogen, UA: 0.2 mg/dL (ref 0.0–1.0)
pH: 6 (ref 5.0–8.0)

## 2012-03-31 LAB — CBC
HCT: 36.2 % — ABNORMAL LOW (ref 39.0–52.0)
Hemoglobin: 12.3 g/dL — ABNORMAL LOW (ref 13.0–17.0)
MCH: 31.4 pg (ref 26.0–34.0)
MCHC: 34 g/dL (ref 30.0–36.0)
MCV: 92.3 fL (ref 78.0–100.0)
Platelets: 179 10*3/uL (ref 150–400)
RBC: 3.92 MIL/uL — ABNORMAL LOW (ref 4.22–5.81)
RDW: 15.6 % — ABNORMAL HIGH (ref 11.5–15.5)
WBC: 6.5 10*3/uL (ref 4.0–10.5)

## 2012-03-31 LAB — URINE MICROSCOPIC-ADD ON

## 2012-03-31 LAB — DIFFERENTIAL
Basophils Absolute: 0 10*3/uL (ref 0.0–0.1)
Basophils Relative: 1 % (ref 0–1)
Eosinophils Absolute: 0.2 10*3/uL (ref 0.0–0.7)
Eosinophils Relative: 3 % (ref 0–5)
Lymphocytes Relative: 28 % (ref 12–46)
Lymphs Abs: 1.8 10*3/uL (ref 0.7–4.0)
Monocytes Absolute: 1 10*3/uL (ref 0.1–1.0)
Monocytes Relative: 16 % — ABNORMAL HIGH (ref 3–12)
Neutro Abs: 3.4 10*3/uL (ref 1.7–7.7)
Neutrophils Relative %: 53 % (ref 43–77)

## 2012-03-31 MED ORDER — SODIUM CHLORIDE 0.9 % IV BOLUS (SEPSIS): 500.0000 mL | Freq: Once | INTRAVENOUS | Status: DC

## 2012-03-31 MED ORDER — CIPROFLOXACIN HCL 500 MG PO TABS: 500.0000 mg | ORAL_TABLET | Freq: Two times a day (BID) | ORAL | Status: AC

## 2012-03-31 NOTE — Discharge Instructions (Signed)
Hematuria, Adult Hematuria (blood in your urine) can be caused by a bladder infection (cystitis), kidney infection (pyelonephritis), prostate infection (prostatitis), or kidney stone. Infections will usually respond to antibiotics (medications which kill germs), and a kidney stone will usually pass through your urine without further treatment. If you were put on antibiotics, take all the medicine until gone. You may feel better in a few days, but take all of your medicine or the infection may not respond and become more difficult to treat. If antibiotics were not given, an infection did not cause the blood in the urine. A further work up to find out the reason may be needed. HOME CARE INSTRUCTIONS   Drink lots of fluid, 3 to 4 quarts a day. If you have been diagnosed with an infection, cranberry juice is especially recommended, in addition to large amounts of water.   Avoid caffeine, tea, and carbonated beverages, because they tend to irritate the bladder.   Avoid alcohol as it may irritate the prostate.   Only take over-the-counter or prescription medicines for pain, discomfort, or fever as directed by your caregiver.   If you have been diagnosed with a kidney stone follow your caregivers instructions regarding straining your urine to catch the stone.  TO PREVENT FURTHER INFECTIONS:  Empty the bladder often. Avoid holding urine for long periods of time.   After a bowel movement, women should cleanse front to back. Use each tissue only once.   Empty the bladder before and after sexual intercourse if you are a male.   Return to your caregiver if you develop back pain, fever, nausea (feeling sick to your stomach), vomiting, or your symptoms (problems) are not better in 3 days. Return sooner if you are getting worse.  If you have been requested to return for further testing make sure to keep your appointments. If an infection is not the cause of blood in your urine, X-rays may be required. Your  caregiver will discuss this with you. SEEK IMMEDIATE MEDICAL CARE IF:   You have a persistent fever over 102 F (38.9 C).   You develop severe vomiting and are unable to keep the medication down.   You develop severe back or abdominal pain despite taking your medications.   You begin passing a large amount of blood or clots in your urine.   You feel extremely weak or faint, or pass out.  MAKE SURE YOU:   Understand these instructions.   Will watch your condition.   Will get help right away if you are not doing well or get worse.  Document Released: 11/08/2005 Document Revised: 10/28/2011 Document Reviewed: 06/27/2008 [facility] Patient Information 2012 [place], [place].  RESOURCE GUIDE  Dental Problems  Patients with Medicaid: [facility] [phone] [address]                                           [phone] [address] Phone:  [phone]                                                   Phone:  [phone]  If  unable to pay or uninsured, contact:  [facility] or [facility]. to become qualified for the adult dental clinic.  Chronic Pain Problems Contact [facility]  [phone] Patients need to be referred by their primary care doctor.  Insufficient Money for Medicine Contact United Way:  call "211" or [facility] [phone].  No Primary Care Doctor Call [facility]  [phone] Other agencies that provide inexpensive medical care    [facility]  [phone]    [facility]  [phone]    [facility]  [phone]    [facility]  [phone]    [facility]  [phone]    [facility]  [phone]  Psychological Services [facility]  [phone] [facility]  [phone] [facility]   [phone] (emergency services [phone])  Abuse/Neglect [facility] Child Abuse Hotline [phone] [facility] Child Abuse Hotline [phone] (After Hours)  Emergency Shelter [address] Ministries [phone]  M[facility] Homes [facility] [place] [facility] [phone] [facility] [phone]  MRSA Hotline #:   580-358-6328    [facility] Resources  [facility]  [employer]                           [facility] Dept. [address] [place]                     [address]         [address]  Blondell Reveal Phone:  [phone]                                  Phone:  [phone]                   Phone:  [phone]  [facility] Mental Health Phone:  [phone]  [facility] Child Abuse Hotline 631-767-4756 [phone] (After Hours)

## 2012-03-31 NOTE — ED Provider Notes (Signed)
History     CSN: [id number]  Arrival date & time 03/31/12  1604   First MD Initiated Contact with Patient 03/31/12 1755      Chief Complaint  Patient presents with  . Flank Pain  . Nausea    (Consider location/radiation/quality/duration/timing/severity/associated sxs/prior treatment) HPI  H/o prostate ca s/p prostatectomy, MM(last chemo 6 weeks ago), DVT on lovenox s/p IVC filter pw Rt flank pain. Patient reports right-sided flank pain and right lower back pain x5 days intermittent. He states that on Monday he began to notice a bit of pain. This persisted on Tuesday night. He also noted hematuria at that time. He states the pain was worse this morning and that the hematuria returned. He describes urinating bright red blood. He denies dysuria, frequency, urgency to urinate. He has experience some nausea without vomiting. Denies constipation. He has minimal diarrhea she feels is unrelated. Denies fevers, chills. He denies numbness, tingling, weakness of his legs. There is no pain radiating to his legs. The pain is not worse with movement. There no exacerbating or relieving factors. He rates the pain as 1/10 at this time is declining pain medication. He states he has not had severe pain since 11 AM. Denies abdominal pain. He did not take pain medication prior to arrival.  ED Notes, ED Provider Notes from 03/31/12 0000 to 03/31/12 17:20:35       [facility], RN 03/31/2012 16:24      Pt reports right flank pain and right lower back pain that has been going on for a few days, but worse this AM. Pt denies history of kidney stones. Pt reports pain is intermittet. Pt reports pain has been gone since 11AM. Pt denies radiation into groin and describes pain as "like something punching him in the back." Pt says he noted blood in urine Tuesday night, pt reports blood in urine stopped until last night when he noted blood in urine again. Pt denies taking medication for pain.     Past Medical  History  Diagnosis Date  . Prostate cancer   . Sleep apnea   . Dyslipidemia   . H/O multiple myeloma   . Spastic quadriparesis   . DVT (deep venous thrombosis)   . Pneumonia   . Arthritis     Past Surgical History  Procedure Date  . Prostatectomy   . Posterior laminectomy / decompression lumbar spine     L4-5  . Carpal tunnel release 2009    left  . Tonsillectomy   . Back surgery   . Anterior cervical decomp/discectomy fusion 02/26/2012    Procedure: ANTERIOR CERVICAL DECOMPRESSION/DISCECTOMY FUSION 2 LEVELS;  Surgeon: Hewitt Shorts, MD;  Location: [facility];  Service: Neurosurgery;  Laterality: N/A;  C3-4 C4-5 Anterior cervical decompression/diskectomy, fusion    No family history on file.  History  Substance Use Topics  . Smoking status: Former Games developer  . Smokeless tobacco: Not on file  . Alcohol Use: 0.6 oz/week    1 Glasses of wine per week     occ      Review of Systems  All other systems reviewed and are negative.   except as noted HPI   Allergies  Review of patient's allergies indicates no known allergies.  Home Medications   Current Outpatient Rx  Name Route Sig Dispense Refill  . ACYCLOVIR 400 MG PO TABS Oral Take 1 tablet (400 mg total) by mouth 2 (two) times daily. 60 tablet 3  . ASPIRIN EC 81 MG  PO TBEC Oral Take 81 mg by mouth daily.    Marland Kitchen CALCIUM CITRATE 950 MG PO TABS Oral Take 1 tablet by mouth daily.    Marland Kitchen ENOXAPARIN SODIUM 150 MG/ML Story SOLN Subcutaneous Inject 0.87 mLs (130 mg total) into the skin daily. 30 Syringe 1  . LENALIDOMIDE 15 MG PO CAPS Oral Take 1 capsule (15 mg total) by mouth daily. Auth # [id number] [employer] 21 capsule 0    Auth # [id number].  Marland Kitchen PROCHLORPERAZINE MALEATE 10 MG PO TABS Oral Take 10 mg by mouth every 6 (six) hours as needed. For nausea/vomiting    . SENNA-DOCUSATE SODIUM 8.6-50 MG PO TABS Oral Take 2 tablets by mouth daily.     Marland Kitchen CIPROFLOXACIN HCL 500 MG PO TABS Oral Take 1 tablet (500 mg total) by mouth every 12  (twelve) hours. 14 tablet 0    BP 113/71  Pulse 88  Temp(Src) 98.3 F (36.8 C) (Oral)  Resp 18  SpO2 99%  Physical Exam  Nursing note and vitals reviewed. Constitutional: He is oriented to person, place, and time. He appears well-developed and well-nourished. No distress.  HENT:  Head: Atraumatic.  Mouth/Throat: Oropharynx is clear and moist.  Eyes: Conjunctivae are normal. Pupils are equal, round, and reactive to light.  Neck: Neck supple.  Cardiovascular: Normal rate, regular rhythm, normal heart sounds and intact distal pulses.  Exam reveals no gallop and no friction rub.   No murmur heard. Pulmonary/Chest: Effort normal. No respiratory distress. He has no wheezes. He has no rales.  Abdominal: Soft. Bowel sounds are normal. He exhibits no distension. There is no tenderness. There is no rebound and no guarding.       No cvat  Musculoskeletal: Normal range of motion. He exhibits no edema and no tenderness.       No midline or paraspinal /l/s ttp No flank ttp   Neurological: He is alert and oriented to person, place, and time.  Skin: Skin is warm and dry.  Psychiatric: He has a normal mood and affect.    ED Course  Procedures (including critical care time)  Labs Reviewed  URINALYSIS, ROUTINE W REFLEX MICROSCOPIC - Abnormal; Notable for the following:    Color, Urine RED (*) BIOCHEMICALS MAY BE AFFECTED BY COLOR   APPearance TURBID (*)    Hgb urine dipstick LARGE (*)    Bilirubin Urine MODERATE (*)    Ketones, ur TRACE (*)    Protein, ur 100 (*)    Leukocytes, UA SMALL (*)    All other components within normal limits  CBC - Abnormal; Notable for the following:    RBC 3.92 (*)    Hemoglobin 12.3 (*)    HCT 36.2 (*)    RDW 15.6 (*)    All other components within normal limits  DIFFERENTIAL - Abnormal; Notable for the following:    Monocytes Relative 16 (*)    All other components within normal limits  COMPREHENSIVE METABOLIC PANEL - Abnormal; Notable for the  following:    Albumin 3.3 (*)    GFR calc non Af Amer 85 (*)    All other components within normal limits  URINE MICROSCOPIC-ADD ON   Ct Abdomen Pelvis Wo Contrast  03/31/2012  *RADIOLOGY REPORT*  Clinical Data: Right flank pain, nausea  CT ABDOMEN AND PELVIS WITHOUT CONTRAST  Technique:  Multidetector CT imaging of the abdomen and pelvis was performed following the standard protocol without intravenous contrast.  Comparison: None.  Findings: Lung bases shows a 6  mm pleural based nodule in the right lower lobe laterally.  Follow-up CT scan in 3 months is recommended to assure stability.  The IVC filter in place is noted.  Unenhanced liver is unremarkable.  At least two calcified gallstones are noted within gallbladder the largest measures 5 mm.  No thickening of gallbladder wall or pericholecystic fluid.  Atherosclerotic calcifications of the abdominal aorta and the iliac arteries are noted.  Sagittal images of the spine shows postsurgical changes with posterior fixation material at L4-L5 level.  Significant disc space flattening with mild anterior and posterior spurring noted at L5-S1 level.  Unenhanced kidneys shows no nephrolithiasis.  There is mild perinephric stranding surrounding lower pole of the right kidney.  This is best visualized in the coronal image 47.  No hydronephrosis or hydroureter.  No calcified calculi are noted along the expected course of bilateral ureter.  There is no small bowel obstruction.  No pericecal inflammation.  The terminal ileum is unremarkable. Stool noted in the right colon.  Multiple sigmoid colon diverticula are noted without definite evidence of acute diverticulitis.  Scattered diverticula are noted left colon without evidence of acute diverticulitis.  The urinary bladder is unremarkable.  The patient is status post prostatectomy.  Bilateral distal ureter is unremarkable.  In coronal image 65 and axial image 74 there is thickening of the rectal wall.  Although this may  be due to inflammatory process a neoplastic process cannot be excluded.  Correlation with clinical exam and colonoscopy is recommended.  IMPRESSION:  1.  There is a 6 mm pleural based nodule in the right lower lobe laterally.  Follow-up CT scan of the chest in 3 months is recommended to assure stability. 2.  At least two calcified gallstones are noted within gallbladder the largest measures 5 mm. 3.  No nephrolithiasis.  No hydronephrosis or hydroureter.  Mild right renal mid and lower pole perinephric stranding.  Clinical correlation is necessary to exclude inflammatory process.  Further evaluation with enhanced study could be performed as clinically warranted. 4.  No calcified ureteral calculi are noted. 5.  Multiple sigmoid colon diverticula without definite evidence of acute diverticulitis. 6.  There is thickening of the rectal wall.  A neoplastic process cannot be excluded.  Correlation with clinical exam and follow-up colonoscopy is recommended. 6.  The patient is status post prostatectomy.  Original Report Authenticated By: Natasha Mead, M.D.    1. Hematuria   2. Pleural nodule   3. Flank pain     MDM  H/o multiple myeloma pw Right flank/back pain. The pain is not reproducible and does not seem msk in nature. It is associated with hematuria. Kidney function wnl. Ct A/P without visualized nephrolithiasis. I suppose it could be possible that he passed a stone-- he is no longer having pain. Min stranding of R kidney without focal abscess. Will treat with cipro. Lovenox could certainly be playing role with hematuria. Hgb stable at baseline. I doubt bone etiology. Bone scan 06/2011 negative for bony lesion and not c/w clinical picture today. No bone lesions noted on CT scan. He will f/u with [facility] for Rt pleural nodule. Rectal wall thickening discussed. He states that he will have a colonoscopy within the next 3 months, delayed 2/2 his c spine surgery. Incidental gallstones without clinical picture of  cholecystitis. Will f/u as needed.  No EMC precluding discharge at this time. Given Precautions for return. PMD/HONC f/u.        Forbes Cellar, MD 03/31/12 1956

## 2012-03-31 NOTE — ED Notes (Signed)
Pt reports right flank pain and right lower back pain that has been going on for a few days, but worse this AM. Pt denies history of kidney stones.  Pt reports pain is intermittet. Pt reports pain has been gone since 11AM.  Pt denies radiation into groin and describes pain as "like something punching him in the back."  Pt says he noted blood in urine Tuesday night, pt reports blood in urine stopped until last night when he noted blood in urine again.  Pt denies taking medication for pain.

## 2012-04-03 ENCOUNTER — Telehealth: Payer: Self-pay | Admitting: *Deleted

## 2012-04-03 NOTE — Telephone Encounter (Signed)
Wanted Dr. Truett Perna aware of her ER visit last week with kidney infection. Had CT scan and wanted to be sure Dr. Truett Perna was aware. Scheduled for surgery on 5/16, but he wants to keep his appointment on 5/15. Has several things he wishes to discuss with Dr. Truett Perna.

## 2012-04-04 ENCOUNTER — Encounter (HOSPITAL_COMMUNITY)
Admission: RE | Admit: 2012-04-04 | Discharge: 2012-04-04 | Disposition: A | Discharge status: Home / Self Care | Payer: Medicare Other | Source: Ambulatory Visit | Attending: Neurosurgery | Admitting: Neurosurgery

## 2012-04-04 ENCOUNTER — Encounter (HOSPITAL_COMMUNITY): Payer: Self-pay

## 2012-04-04 HISTORY — DX: Pain in unspecified joint: M25.50

## 2012-04-04 LAB — SURGICAL PCR SCREEN
MRSA, PCR: NEGATIVE
Staphylococcus aureus: POSITIVE — AB

## 2012-04-04 NOTE — Progress Notes (Signed)
Sleep study done about 52yrs ago--over near [facility] and pt does use a CPAP

## 2012-04-04 NOTE — Pre-Procedure Instructions (Signed)
20 Brent Allison  04/04/2012   Your procedure is scheduled on: Thurs, May 16 @ 7:30 AM  [facility] at 5:30 AM.  Call this number if you have problems the morning of surgery: [phone]   Remember:   Do not eat food:After Midnight.  May have clear liquids: up to 4 Hours before arrival.(until 1:30 AM)  Clear liquids include soda, tea, black coffee, apple or grape juice, broth,water  Take these medicines the morning of surgery with A SIP OF WATER: Acyclovir and Compazine(if needed)   Do not wear jewelry  Do not wear lotions, powders, or cologne  Do not shave 48 hours prior to surgery. Men may shave face and neck.  Do not bring valuables to the hospital.  Contacts, dentures or bridgework may not be worn into surgery.  Leave suitcase in the car. After surgery it may be brought to your room.  For patients admitted to the hospital, checkout time is 11:00 AM the day of discharge.   Special Instructions: CHG Shower Use Special Wash: 1/2 bottle night before surgery and 1/2 bottle morning of surgery.   Please read over the following fact sheets that you were given: Pain Booklet, Coughing and Deep Breathing, MRSA Information and Surgical Site Infection Prevention

## 2012-04-04 NOTE — Progress Notes (Signed)
Pt doesn't have a cardiologist  Heart cath in epic from 2010  Stress test done about 12yrs ago Denies ever having an echo  Medical Md Is Dr.Kevin Little

## 2012-04-05 ENCOUNTER — Ambulatory Visit (HOSPITAL_BASED_OUTPATIENT_CLINIC_OR_DEPARTMENT_OTHER): Payer: Medicare Other | Admitting: Oncology

## 2012-04-05 ENCOUNTER — Telehealth: Payer: Self-pay | Admitting: Oncology

## 2012-04-05 DIAGNOSIS — C9000 Multiple myeloma not having achieved remission: Secondary | ICD-10-CM

## 2012-04-05 MED ORDER — CEFAZOLIN SODIUM-DEXTROSE 2-3 GM-% IV SOLR
2.0000 g | INTRAVENOUS | Status: AC
  Administered 2012-04-06: 2 g via INTRAVENOUS

## 2012-04-05 NOTE — Progress Notes (Signed)
[facility]    OFFICE PROGRESS NOTE   INTERVAL HISTORY:   He returns as scheduled. He has noted improvement in the arm/hand strength and resolution of ataxia following cervical spine surgery. He continues to have numbness in the fingers. He is scheduled for surgery 04/06/2012 for additional decompression of the cervical spine. Lovenox has been placed on hold for surgery.  He went to the emergency room on May 10 with gross hematuria and right flank discomfort. A CT revealed no nephrolithiasis, hydronephrosis, or hydroureter. Mild perinephric stranding was noted at the mid and lower pole of the right kidney. Thickening of the rectal wall was noted of unclear significance. He was treated with ciprofloxacin. The pain resolved and the hematuria has not recurred.  He reports episodes of bleeding with firm bowel movements. He plans to schedule a colonoscopy within the next few months.  He reports an improved appetite.  Objective:  Vital signs in last 24 hours:  Blood pressure 117/74, pulse 77, temperature 97.7 F (36.5 C), temperature source Oral, height 6\' 1"  (1.854 m), weight 195 lb 12.8 oz (88.814 kg).    HEENT: Small ecchymoses at the eyelids, small ecchymosis at the left buccal mucosa, macroglossia, mild whitecoat over the tongue Resp: Lungs clear bilaterally Cardio: Regular rate and rhythm GI: No hepatosplenomegaly, nontender Vascular: Trace edema at the right lower leg and ankle Neuro: Alert and oriented, the motor exam appears intact in the upper and lower extremities    Portacath/PICC-without erythema  Lab Results:  Lab Results  Component Value Date   WBC 6.5 03/31/2012   HGB 12.3* 03/31/2012   HCT 36.2* 03/31/2012   MCV 92.3 03/31/2012   PLT 179 03/31/2012   ANC 3.4 Creatinine 0.94   Medications: I have reviewed the patient's current medications.  Assessment/Plan: 1. 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 2. "Bruising" at the eyelids and  mouth: Likely related to amyloidosis, persistent. 3. Numbness and loss of vibratory sense at the fingertips: This predated Velcade-based therapy, but  worsened. 4. Elevated serum free lambda light chains. The lambda light chains were lower on November 16 and slightly higher on 12/14/2011. 5. Lambda light chain proteinuria. 6. Bone marrow plasmacytosis: Variable increase in plasma cells noted on the bone marrow biopsy 07/29/2011 with plasma cells estimated to represent between 4% and 20% of the cellular population. 7. Remote history of prostate cancer. 8. Sleep apnea. 9. Dyslipidemia. 10. Report of pneumonia on 2 occasions in 2011. 11. Admission to a hospital in [place], [place] October 2012 with "pneumonia." A chest [facility] at [facility] on November 2 was negative .  12. Low serum immunoglobulin G level. 13. Borderline-low hemoglobin level, improved. 14. Plasma cell dyscrasia with associated amyloidosis.  a. Initiation of systemic therapy with Cytoxan, Velcade, and Decadron 08/12/2011. Cycle #2 was initiated on 09/16/2011. Cycle #3 was initiated on 10/15/2011. Velcade was placed on hold due to 2 neuropathy. He continues weekly Cytoxan/Decadron b. The serum free lambda light chains were decreased on October 08 2011-7/21 2012. c. The serum free lambda light chains were slightly increased on 12/14/2011, lower on 12/29/2011 and 02/02/2012 d. Initiation of Revlimid/Decadron February 2013, cycle 2 started on 01/28/2012 15. Loss of "balance "and proximal motor weakness secondary to cervical stenosis-status post decompression surgery on 02/19/2012. Improved, but not resolved stenosis with mass effect on the spinal cord noted on a repeat MRI 03/28/2012. He is scheduled for further decompression surgery on 04/06/2012 16. Right lower showed a deep vein thrombosis-a Doppler ultrasound confirmed  a gastrocnemius and peroneal deep vein thrombosis. An IVC filter was placed prior to surgery and he is  maintained on the Lovenox. He will be transitioned to Coumadin following the next surgery.  17. Anorexia following cervical spine surgery, improved 18. Episode of flank pain and hematuria in 03/31/2012-etiology unclear. The hematuria may have been related to an unrecognized on, Lovenox therapy, or an infection. He completed a course of antibiotics. The hematuria and pain have resolved. 19. Rectal wall thickening noted on a CT of the pelvis 03/31/2012-he will schedule a colonoscopy when he recovers from the cervical spine surgery 20. Lung nodule noted on the CT 03/31/2012-we will consider obtaining a followup CT in 3-6 months.    Disposition:  He appears stable from a hematologic standpoint. He will proceed with the planned on 04/06/2012. Lovenox will be continued following surgery and he will be transitioned to Coumadin.  [name] will return for office visit on 04/26/2012.   [name], MD  04/05/2012  10:55 AM

## 2012-04-05 NOTE — Telephone Encounter (Signed)
appts made and printed for pt aom °

## 2012-04-06 ENCOUNTER — Inpatient Hospital Stay (HOSPITAL_COMMUNITY)
Admission: RE | Admit: 2012-04-06 | Discharge: 2012-04-08 | DRG: 471 | Disposition: A | Discharge status: Home / Self Care | Payer: Medicare Other | Source: Ambulatory Visit | Attending: Neurosurgery | Admitting: Neurosurgery

## 2012-04-06 ENCOUNTER — Encounter (HOSPITAL_COMMUNITY): Payer: Self-pay | Admitting: Surgery

## 2012-04-06 ENCOUNTER — Encounter (HOSPITAL_COMMUNITY): Payer: Self-pay | Admitting: Certified Registered"

## 2012-04-06 ENCOUNTER — Encounter (HOSPITAL_COMMUNITY)
Admission: RE | Disposition: A | Discharge status: Home / Self Care | Payer: Self-pay | Source: Ambulatory Visit | Attending: Neurosurgery

## 2012-04-06 ENCOUNTER — Inpatient Hospital Stay (HOSPITAL_COMMUNITY): Payer: Medicare Other | Admitting: Certified Registered"

## 2012-04-06 ENCOUNTER — Ambulatory Visit (HOSPITAL_COMMUNITY): Payer: Medicare Other

## 2012-04-06 DIAGNOSIS — C9000 Multiple myeloma not having achieved remission: Secondary | ICD-10-CM | POA: Diagnosis present

## 2012-04-06 DIAGNOSIS — E785 Hyperlipidemia, unspecified: Secondary | ICD-10-CM | POA: Diagnosis present

## 2012-04-06 DIAGNOSIS — Z79899 Other long term (current) drug therapy: Secondary | ICD-10-CM

## 2012-04-06 DIAGNOSIS — Z01812 Encounter for preprocedural laboratory examination: Secondary | ICD-10-CM

## 2012-04-06 DIAGNOSIS — E859 Amyloidosis, unspecified: Secondary | ICD-10-CM | POA: Diagnosis present

## 2012-04-06 DIAGNOSIS — Z7982 Long term (current) use of aspirin: Secondary | ICD-10-CM

## 2012-04-06 DIAGNOSIS — M5000 Cervical disc disorder with myelopathy, unspecified cervical region: Secondary | ICD-10-CM | POA: Diagnosis present

## 2012-04-06 DIAGNOSIS — G4730 Sleep apnea, unspecified: Secondary | ICD-10-CM | POA: Diagnosis present

## 2012-04-06 DIAGNOSIS — Z86718 Personal history of other venous thrombosis and embolism: Secondary | ICD-10-CM

## 2012-04-06 DIAGNOSIS — G8250 Quadriplegia, unspecified: Secondary | ICD-10-CM | POA: Diagnosis present

## 2012-04-06 DIAGNOSIS — G589 Mononeuropathy, unspecified: Secondary | ICD-10-CM | POA: Diagnosis present

## 2012-04-06 DIAGNOSIS — M4712 Other spondylosis with myelopathy, cervical region: Principal | ICD-10-CM | POA: Diagnosis present

## 2012-04-06 HISTORY — PX: POSTERIOR CERVICAL FUSION/FORAMINOTOMY: SHX5038

## 2012-04-06 SURGERY — POSTERIOR CERVICAL FUSION/FORAMINOTOMY LEVEL 3
Anesthesia: General

## 2012-04-06 MED ORDER — SENNOSIDES-DOCUSATE SODIUM 8.6-50 MG PO TABS
2.0000 | ORAL_TABLET | Freq: Every day | ORAL | Status: DC
  Administered 2012-04-06 – 2012-04-07 (×2): 2 via ORAL
  Filled 2012-04-06 (×5): qty 2

## 2012-04-06 MED ORDER — SODIUM CHLORIDE 0.9 % IR SOLN
Status: DC | PRN
  Administered 2012-04-06: 08:00:00

## 2012-04-06 MED ORDER — ACETAMINOPHEN 650 MG RE SUPP: 650.0000 mg | RECTAL | Status: DC | PRN

## 2012-04-06 MED ORDER — SUFENTANIL CITRATE 50 MCG/ML IV SOLN
INTRAVENOUS | Status: DC | PRN
  Administered 2012-04-06 (×3): 5 ug via INTRAVENOUS

## 2012-04-06 MED ORDER — THROMBIN 20000 UNITS EX KIT
PACK | CUTANEOUS | Status: DC | PRN
  Administered 2012-04-06 (×2): via TOPICAL

## 2012-04-06 MED ORDER — LIDOCAINE HCL (CARDIAC) 20 MG/ML IV SOLN
INTRAVENOUS | Status: DC | PRN
  Administered 2012-04-06: 40 mg via INTRAVENOUS

## 2012-04-06 MED ORDER — CEFAZOLIN SODIUM-DEXTROSE 2-3 GM-% IV SOLR
INTRAVENOUS | Status: AC
  Filled 2012-04-06: qty 50

## 2012-04-06 MED ORDER — MIDAZOLAM HCL 5 MG/5ML IJ SOLN
INTRAMUSCULAR | Status: DC | PRN
  Administered 2012-04-06: 2 mg via INTRAVENOUS

## 2012-04-06 MED ORDER — SODIUM CHLORIDE 0.9 % IJ SOLN
3.0000 mL | INTRAMUSCULAR | Status: DC | PRN
  Administered 2012-04-07: 3 mL via INTRAVENOUS

## 2012-04-06 MED ORDER — LACTATED RINGERS IV SOLN
INTRAVENOUS | Status: DC | PRN
  Administered 2012-04-06 (×2): via INTRAVENOUS

## 2012-04-06 MED ORDER — KETOROLAC TROMETHAMINE 30 MG/ML IJ SOLN: 30.0000 mg | Freq: Once | INTRAMUSCULAR | Status: DC

## 2012-04-06 MED ORDER — MORPHINE SULFATE 4 MG/ML IJ SOLN
4.0000 mg | INTRAMUSCULAR | Status: DC | PRN
  Administered 2012-04-06: 4 mg via INTRAVENOUS
  Filled 2012-04-06: qty 1

## 2012-04-06 MED ORDER — ONDANSETRON HCL 4 MG/2ML IJ SOLN
INTRAMUSCULAR | Status: DC | PRN
  Administered 2012-04-06: 4 mg via INTRAVENOUS

## 2012-04-06 MED ORDER — MENTHOL 3 MG MT LOZG
1.0000 | LOZENGE | OROMUCOSAL | Status: DC | PRN
  Filled 2012-04-06: qty 9

## 2012-04-06 MED ORDER — HYDROMORPHONE HCL PF 1 MG/ML IJ SOLN
0.2500 mg | INTRAMUSCULAR | Status: DC | PRN
  Administered 2012-04-06 (×2): 0.5 mg via INTRAVENOUS

## 2012-04-06 MED ORDER — ROCURONIUM BROMIDE 100 MG/10ML IV SOLN
INTRAVENOUS | Status: DC | PRN
  Administered 2012-04-06: 50 mg via INTRAVENOUS

## 2012-04-06 MED ORDER — DEXAMETHASONE SODIUM PHOSPHATE 10 MG/ML IJ SOLN
INTRAMUSCULAR | Status: DC | PRN
  Administered 2012-04-06: 10 mg via INTRAVENOUS

## 2012-04-06 MED ORDER — KCL IN DEXTROSE-NACL 20-5-0.45 MEQ/L-%-% IV SOLN
INTRAVENOUS | Status: DC
  Administered 2012-04-06: 20:00:00 via INTRAVENOUS
  Filled 2012-04-06 (×14): qty 1000

## 2012-04-06 MED ORDER — SENNA-DOCUSATE SODIUM 8.6-50 MG PO TABS
2.0000 | ORAL_TABLET | Freq: Every day | ORAL | Status: DC
Start: 2012-04-06 — End: 2012-04-06

## 2012-04-06 MED ORDER — SODIUM CHLORIDE 0.9 % IJ SOLN
3.0000 mL | Freq: Two times a day (BID) | INTRAMUSCULAR | Status: DC
  Administered 2012-04-06 – 2012-04-07 (×4): 3 mL via INTRAVENOUS

## 2012-04-06 MED ORDER — ONDANSETRON HCL 4 MG/2ML IJ SOLN: 4.0000 mg | Freq: Once | INTRAMUSCULAR | Status: DC | PRN

## 2012-04-06 MED ORDER — ACETAMINOPHEN 325 MG PO TABS: 650.0000 mg | ORAL_TABLET | ORAL | Status: DC | PRN

## 2012-04-06 MED ORDER — BACITRACIN 50000 UNITS IM SOLR
INTRAMUSCULAR | Status: AC
  Filled 2012-04-06: qty 1

## 2012-04-06 MED ORDER — BUPIVACAINE HCL (PF) 0.5 % IJ SOLN
INTRAMUSCULAR | Status: DC | PRN
  Administered 2012-04-06: 10 mL

## 2012-04-06 MED ORDER — VECURONIUM BROMIDE 10 MG IV SOLR
INTRAVENOUS | Status: DC | PRN
  Administered 2012-04-06 (×3): 3 mg via INTRAVENOUS
  Administered 2012-04-06 (×5): 2 mg via INTRAVENOUS

## 2012-04-06 MED ORDER — GLYCOPYRROLATE 0.2 MG/ML IJ SOLN
INTRAMUSCULAR | Status: DC | PRN
  Administered 2012-04-06: .6 mg via INTRAVENOUS

## 2012-04-06 MED ORDER — ZOLPIDEM TARTRATE 5 MG PO TABS: 5.0000 mg | ORAL_TABLET | Freq: Every evening | ORAL | Status: DC | PRN

## 2012-04-06 MED ORDER — HYDROXYZINE HCL 25 MG PO TABS: 50.0000 mg | ORAL_TABLET | ORAL | Status: DC | PRN

## 2012-04-06 MED ORDER — MAGNESIUM HYDROXIDE 400 MG/5ML PO SUSP: 30.0000 mL | Freq: Every day | ORAL | Status: DC | PRN

## 2012-04-06 MED ORDER — LIDOCAINE-EPINEPHRINE 1 %-1:100000 IJ SOLN
INTRAMUSCULAR | Status: DC | PRN
  Administered 2012-04-06: 10 mL

## 2012-04-06 MED ORDER — ALBUMIN HUMAN 5 % IV SOLN
INTRAVENOUS | Status: DC | PRN
  Administered 2012-04-06 (×3): via INTRAVENOUS

## 2012-04-06 MED ORDER — ACYCLOVIR 400 MG PO TABS
400.0000 mg | ORAL_TABLET | Freq: Two times a day (BID) | ORAL | Status: DC
  Administered 2012-04-06 – 2012-04-07 (×3): 400 mg via ORAL
  Filled 2012-04-06 (×5): qty 1

## 2012-04-06 MED ORDER — NEOSTIGMINE METHYLSULFATE 1 MG/ML IJ SOLN
INTRAMUSCULAR | Status: DC | PRN
  Administered 2012-04-06: 4 mg via INTRAVENOUS

## 2012-04-06 MED ORDER — BISACODYL 10 MG RE SUPP: 10.0000 mg | Freq: Every day | RECTAL | Status: DC | PRN

## 2012-04-06 MED ORDER — CYCLOBENZAPRINE HCL 10 MG PO TABS
10.0000 mg | ORAL_TABLET | Freq: Three times a day (TID) | ORAL | Status: DC | PRN
  Administered 2012-04-06 – 2012-04-07 (×3): 10 mg via ORAL
  Filled 2012-04-06 (×3): qty 1

## 2012-04-06 MED ORDER — KETOROLAC TROMETHAMINE 30 MG/ML IJ SOLN
30.0000 mg | Freq: Four times a day (QID) | INTRAMUSCULAR | Status: AC
  Administered 2012-04-06 – 2012-04-07 (×7): 30 mg via INTRAVENOUS
  Filled 2012-04-06 (×8): qty 1

## 2012-04-06 MED ORDER — CIPROFLOXACIN HCL 500 MG PO TABS
500.0000 mg | ORAL_TABLET | Freq: Two times a day (BID) | ORAL | Status: DC
  Administered 2012-04-06 – 2012-04-07 (×3): 500 mg via ORAL
  Filled 2012-04-06 (×9): qty 1

## 2012-04-06 MED ORDER — ALUM & MAG HYDROXIDE-SIMETH 200-200-20 MG/5ML PO SUSP: 30.0000 mL | Freq: Four times a day (QID) | ORAL | Status: DC | PRN

## 2012-04-06 MED ORDER — PHENOL 1.4 % MT LIQD: 1.0000 | OROMUCOSAL | Status: DC | PRN

## 2012-04-06 MED ORDER — SODIUM CHLORIDE 0.9 % IV SOLN
10.0000 mg | INTRAVENOUS | Status: DC | PRN
  Administered 2012-04-06: 20 ug/min via INTRAVENOUS

## 2012-04-06 MED ORDER — KETOROLAC TROMETHAMINE 30 MG/ML IJ SOLN
INTRAMUSCULAR | Status: AC
  Filled 2012-04-06: qty 1

## 2012-04-06 MED ORDER — HYDROCODONE-ACETAMINOPHEN 5-325 MG PO TABS
1.0000 | ORAL_TABLET | ORAL | Status: DC | PRN
  Administered 2012-04-07 – 2012-04-08 (×2): 2 via ORAL
  Filled 2012-04-06 (×2): qty 2

## 2012-04-06 MED ORDER — HYDROMORPHONE HCL PF 1 MG/ML IJ SOLN
INTRAMUSCULAR | Status: AC
  Filled 2012-04-06: qty 1

## 2012-04-06 MED ORDER — FENTANYL CITRATE 0.05 MG/ML IJ SOLN
INTRAMUSCULAR | Status: DC | PRN
  Administered 2012-04-06: 50 ug via INTRAVENOUS
  Administered 2012-04-06 (×2): 100 ug via INTRAVENOUS

## 2012-04-06 MED ORDER — HYDROXYZINE HCL 50 MG/ML IM SOLN: 50.0000 mg | INTRAMUSCULAR | Status: DC | PRN

## 2012-04-06 MED ORDER — PROPOFOL 10 MG/ML IV EMUL
INTRAVENOUS | Status: DC | PRN
  Administered 2012-04-06: 140 mg via INTRAVENOUS

## 2012-04-06 MED ORDER — PHENYLEPHRINE HCL 10 MG/ML IJ SOLN
INTRAMUSCULAR | Status: DC | PRN
  Administered 2012-04-06 (×3): 80 ug via INTRAVENOUS
  Administered 2012-04-06: 40 ug via INTRAVENOUS
  Administered 2012-04-06: 120 ug via INTRAVENOUS

## 2012-04-06 MED ORDER — 0.9 % SODIUM CHLORIDE (POUR BTL) OPTIME
TOPICAL | Status: DC | PRN
  Administered 2012-04-06: 1000 mL

## 2012-04-06 MED ORDER — OXYCODONE-ACETAMINOPHEN 5-325 MG PO TABS
1.0000 | ORAL_TABLET | ORAL | Status: DC | PRN
  Administered 2012-04-06 – 2012-04-07 (×5): 2 via ORAL
  Filled 2012-04-06 (×6): qty 2

## 2012-04-06 MED ORDER — SODIUM CHLORIDE 0.9 % IV SOLN
INTRAVENOUS | Status: AC
  Filled 2012-04-06: qty 500

## 2012-04-06 SURGICAL SUPPLY — 81 items
3.5 mm x 16 mm vuepoint screw ×2 IMPLANT
3.5 mm x 20 mm vuepoint screw ×1 IMPLANT
80 mm vuepoint rod ×2 IMPLANT
ADH SKN CLS APL DERMABOND .7 (GAUZE/BANDAGES/DRESSINGS) ×1
ADH SKN CLS LQ APL DERMABOND (GAUZE/BANDAGES/DRESSINGS) ×1
Acute offset connector ×1 IMPLANT
BAG DECANTER FOR FLEXI CONT (MISCELLANEOUS) ×2 IMPLANT
BANDAGE GAUZE 4  KLING STR (GAUZE/BANDAGES/DRESSINGS) IMPLANT
BIT DRILL NEURO 2X3.1 SFT TUCH (MISCELLANEOUS) ×1 IMPLANT
BIT DRILL WIRE PASS 1.3MM (BIT) IMPLANT
BLADE SURG 11 STRL SS (BLADE) IMPLANT
BLADE SURG ROTATE 9660 (MISCELLANEOUS) ×2 IMPLANT
CANISTER SUCTION 2500CC (MISCELLANEOUS) ×2 IMPLANT
CLOTH BEACON ORANGE TIMEOUT ST (SAFETY) ×2 IMPLANT
CONT SPEC 4OZ CLIKSEAL STRL BL (MISCELLANEOUS) ×2 IMPLANT
COVER MAYO STAND STRL (DRAPES) IMPLANT
COVER TABLE BACK 60X90 (DRAPES) ×2 IMPLANT
DECANTER SPIKE VIAL GLASS SM (MISCELLANEOUS) ×2 IMPLANT
DERMABOND ADHESIVE PROPEN (GAUZE/BANDAGES/DRESSINGS) ×1
DERMABOND ADVANCED (GAUZE/BANDAGES/DRESSINGS) ×1
DERMABOND ADVANCED .7 DNX12 (GAUZE/BANDAGES/DRESSINGS) ×1 IMPLANT
DERMABOND ADVANCED .7 DNX6 (GAUZE/BANDAGES/DRESSINGS) IMPLANT
DRAPE C-ARM 42X72 X-RAY (DRAPES) ×4 IMPLANT
DRAPE LAPAROTOMY 100X72 PEDS (DRAPES) ×2 IMPLANT
DRAPE POUCH INSTRU U-SHP 10X18 (DRAPES) ×2 IMPLANT
DRAPE PROXIMA HALF (DRAPES) IMPLANT
DRILL NEURO 2X3.1 SOFT TOUCH (MISCELLANEOUS) ×2
DRILL WIRE PASS 1.3MM (BIT)
DRSG EMULSION OIL 3X3 NADH (GAUZE/BANDAGES/DRESSINGS) IMPLANT
ELECT REM PT RETURN 9FT ADLT (ELECTROSURGICAL) ×2
ELECTRODE REM PT RTRN 9FT ADLT (ELECTROSURGICAL) ×1 IMPLANT
EVACUATOR 1/8 PVC DRAIN (DRAIN) ×2 IMPLANT
GAUZE SPONGE 4X4 16PLY XRAY LF (GAUZE/BANDAGES/DRESSINGS) ×1 IMPLANT
GLOVE BIO SURGEON STRL SZ8 (GLOVE) ×1 IMPLANT
GLOVE BIOGEL PI IND STRL 7.0 (GLOVE) ×2 IMPLANT
GLOVE BIOGEL PI IND STRL 8 (GLOVE) ×1 IMPLANT
GLOVE BIOGEL PI IND STRL 8.5 (GLOVE) IMPLANT
GLOVE BIOGEL PI INDICATOR 7.0 (GLOVE) ×2
GLOVE BIOGEL PI INDICATOR 8 (GLOVE) ×2
GLOVE BIOGEL PI INDICATOR 8.5 (GLOVE) ×1
GLOVE ECLIPSE 7.5 STRL STRAW (GLOVE) ×4 IMPLANT
GLOVE EXAM NITRILE LRG STRL (GLOVE) IMPLANT
GLOVE EXAM NITRILE MD LF STRL (GLOVE) ×1 IMPLANT
GLOVE EXAM NITRILE XL STR (GLOVE) IMPLANT
GLOVE EXAM NITRILE XS STR PU (GLOVE) IMPLANT
GLOVE SURG SS PI 6.5 STRL IVOR (GLOVE) ×8 IMPLANT
GOWN BRE IMP SLV AUR LG STRL (GOWN DISPOSABLE) ×2 IMPLANT
GOWN BRE IMP SLV AUR XL STRL (GOWN DISPOSABLE) ×4 IMPLANT
GOWN STRL REIN 2XL LVL4 (GOWN DISPOSABLE) IMPLANT
HEMOSTAT SURGICEL 2X14 (HEMOSTASIS) IMPLANT
KIT BASIN OR (CUSTOM PROCEDURE TRAY) ×2 IMPLANT
KIT ROOM TURNOVER OR (KITS) ×2 IMPLANT
NDL HYPO 18GX1.5 BLUNT FILL (NEEDLE) IMPLANT
NDL SPNL 18GX3.5 QUINCKE PK (NEEDLE) ×1 IMPLANT
NEEDLE HYPO 18GX1.5 BLUNT FILL (NEEDLE) IMPLANT
NEEDLE SPNL 18GX3.5 QUINCKE PK (NEEDLE) ×2 IMPLANT
NEEDLE SPNL 22GX3.5 QUINCKE BK (NEEDLE) ×2 IMPLANT
NS IRRIG 1000ML POUR BTL (IV SOLUTION) ×2 IMPLANT
PACK LAMINECTOMY NEURO (CUSTOM PROCEDURE TRAY) ×2 IMPLANT
PAD ARMBOARD 7.5X6 YLW CONV (MISCELLANEOUS) ×5 IMPLANT
PATTIES SURGICAL 1X1 (DISPOSABLE) ×1 IMPLANT
PIN MAYFIELD SKULL DISP (PIN) ×2 IMPLANT
SCREW MA MM 3.5X14 (Screw) ×12 IMPLANT
SCREW SET THREADED (Screw) ×16 IMPLANT
SPONGE GAUZE 4X4 12PLY (GAUZE/BANDAGES/DRESSINGS) ×4 IMPLANT
SPONGE LAP 4X18 X RAY DECT (DISPOSABLE) IMPLANT
SPONGE SURGIFOAM ABS GEL 100 (HEMOSTASIS) ×2 IMPLANT
STAPLER SKIN PROX WIDE 3.9 (STAPLE) ×2 IMPLANT
STRIP BIOACTIVE VITOSS 25X52X4 (Orthopedic Implant) ×2 IMPLANT
SUT ETHILON 3 0 FSL (SUTURE) ×1 IMPLANT
SUT VIC AB 0 CT1 18XCR BRD8 (SUTURE) ×1 IMPLANT
SUT VIC AB 0 CT1 8-18 (SUTURE) ×4
SUT VIC AB 2-0 CP2 18 (SUTURE) ×3 IMPLANT
SYR 20ML ECCENTRIC (SYRINGE) ×2 IMPLANT
TAPE CLOTH SURG 4X10 WHT LF (GAUZE/BANDAGES/DRESSINGS) ×1 IMPLANT
TOWEL OR 17X24 6PK STRL BLUE (TOWEL DISPOSABLE) ×2 IMPLANT
TOWEL OR 17X26 10 PK STRL BLUE (TOWEL DISPOSABLE) ×2 IMPLANT
TRAY FOLEY CATH 14FRSI W/METER (CATHETERS) ×1 IMPLANT
TUBE CONNECTING 12X1/4 (SUCTIONS) ×1 IMPLANT
UNDERPAD 30X30 INCONTINENT (UNDERPADS AND DIAPERS) ×1 IMPLANT
WATER STERILE IRR 1000ML POUR (IV SOLUTION) ×2 IMPLANT

## 2012-04-06 NOTE — Anesthesia Preprocedure Evaluation (Addendum)
Anesthesia Evaluation  Patient identified by MRN, date of birth, ID band Patient awake    Reviewed: Allergy & Precautions, H&P , NPO status , Patient's Chart, lab work & pertinent test results  Airway Mallampati: III TM Distance: >3 FB Neck ROM: Limited    Dental  (+) Teeth Intact and Dental Advisory Given   Pulmonary sleep apnea and Continuous Positive Airway Pressure Ventilation ,  breath sounds clear to auscultation        Cardiovascular Rhythm:Regular Rate:Normal     Neuro/Psych  Neuromuscular disease    GI/Hepatic   Endo/Other    Renal/GU      Musculoskeletal   Abdominal   Peds  Hematology   Anesthesia Other Findings   Reproductive/Obstetrics                           Anesthesia Physical Anesthesia Plan  ASA: III  Anesthesia Plan: General   Post-op Pain Management:    Induction: Intravenous  Airway Management Planned: Oral ETT  Additional Equipment:   Intra-op Plan:   Post-operative Plan:   Informed Consent: I have reviewed the patients History and Physical, chart, labs and discussed the procedure including the risks, benefits and alternatives for the proposed anesthesia with the patient or authorized representative who has indicated his/her understanding and acceptance.   Dental advisory given  Plan Discussed with: CRNA and Surgeon  Anesthesia Plan Comments: (Multiple Myeloma Cervical Spondylosis with myelopathy Nl Cardiac Cath 11/17/2009 EF 55%, No CAD)        Anesthesia Quick Evaluation

## 2012-04-06 NOTE — Progress Notes (Signed)
Patient is to place his home cpap on himself when ready for sleep. RT will continue to monitor.

## 2012-04-06 NOTE — Anesthesia Postprocedure Evaluation (Signed)
  Anesthesia Post-op Note  Patient: Brent Allison  Procedure(s) Performed: Procedure(s) (LRB): POSTERIOR CERVICAL FUSION/FORAMINOTOMY LEVEL 3 (N/A)  Patient Location: PACU  Anesthesia Type: General  Level of Consciousness: awake, alert  and oriented  Airway and Oxygen Therapy: Patient Spontanous Breathing and Patient connected to nasal cannula oxygen  Post-op Pain: mild  Post-op Assessment: Post-op Vital signs reviewed and Patient's Cardiovascular Status Stable  Post-op Vital Signs: stable  Complications: No apparent anesthesia complications

## 2012-04-06 NOTE — Progress Notes (Signed)
Pt has small areas of petechiae periorbital bilat. Also has small hematoma and some bruising at right mouth area from ETT.  Per CRNA, Dr Noreene Larsson aware.  Will cont to monitor.

## 2012-04-06 NOTE — Progress Notes (Signed)
Anesthesiology Follow-up:  [name] is complaining of scratchiness in the left eye. Vision OK.  Exam: mild conjunctival injection OS.  Impression: Possible mild L. Corneal abrasion. Observe, pain meds  as ordered. Will follow, symptoms should resolve within 24 hours. If symptoms persist, plan opthalmology consultation.  Kipp Brood, MD

## 2012-04-06 NOTE — Transfer of Care (Signed)
Immediate Anesthesia Transfer of Care Note  Patient: Brent Allison  Procedure(s) Performed: Procedure(s) (LRB): POSTERIOR CERVICAL FUSION/FORAMINOTOMY LEVEL 3 (N/A)  Patient Location: PACU  Anesthesia Type: General  Level of Consciousness: oriented, patient cooperative, lethargic and responds to stimulation  Airway & Oxygen Therapy: Patient Spontanous Breathing and Patient connected to nasal cannula oxygen  Post-op Assessment: Report given to PACU RN and Patient moving all extremities X 4  Post vital signs: Reviewed and stable  Complications: No apparent anesthesia complications

## 2012-04-06 NOTE — Progress Notes (Signed)
Filed Vitals:   04/06/12 1345 04/06/12 1400 04/06/12 1430 04/06/12 1642  BP: 110/63 105/53 108/70 117/73  Pulse: 78 77 80 82  Temp:  97 F (36.1 C) 97.3 F (36.3 C) 97.6 F (36.4 C)  TempSrc:      Resp: 16 15 16 16   SpO2: 100% 100% 99% 100%    Patient resting in bed. Has ambulated so far once in the halls. Dressing is clean and dry. Hemovac drain has put out 80 cc. Moving all extremities well, but quadriparesis remains. Foley to straight drainage.  Plan: Encouraged at least 2 more ambulations in the [place] this evening if feasible. We'll DC Foley once adequately up and about.  Hewitt Shorts, MD 04/06/2012, 6:36 PM

## 2012-04-06 NOTE — Anesthesia Procedure Notes (Signed)
Procedure Name: Intubation Date/Time: 04/06/2012 7:42 AM Performed by: Jefm Miles E Pre-anesthesia Checklist: Patient identified, Timeout performed, Emergency Drugs available, Suction available and Patient being monitored Patient Re-evaluated:Patient Re-evaluated prior to inductionOxygen Delivery Method: Circle system utilized Preoxygenation: Pre-oxygenation with 100% oxygen Intubation Type: IV induction Ventilation: Two handed mask ventilation required and Oral airway inserted - appropriate to patient size Laryngoscope Size: Mac and 3 (first attempt, no view) Grade View: Grade IV Tube type: Oral Tube size: 7.5 mm Airway Equipment and Method: Stylet and Video-laryngoscopy Placement Confirmation: positive ETCO2,  ETT inserted through vocal cords under direct vision,  breath sounds checked- equal and bilateral and CO2 detector Secured at: 22 cm Tube secured with: Tape (Dr. Noreene Larsson taped the ETT ) Dental Injury: Teeth and Oropharynx as per pre-operative assessment and Injury to lip  Difficulty Due To: Difficult Airway- due to limited oral opening Comments: First attempt with Mac 3, no view of epiglottis, injury to upper left lip. Second attempt successful with Glidescope, grade 1 view. Eye ointment applied heavily bilaterally, Dr. Noreene Larsson covered eyelids with gauze and secured with tegaderm to avoid placing tape directly on the eyelids due to patient's history. Dr. Noreene Larsson taped the ETT in place.

## 2012-04-06 NOTE — H&P (Signed)
Subjective: Patient is a 66 y.o. male who is admitted for treatment of cervical stenosis secondary to cervical spondylosis, with resulting myelopathy and quadriparesis. He is 6 weeks status post a 2 level CIII-4 C4-5 anterior cervical decompression and arthrodesis with allograft and tether cervical plating. It is anticipated that he might require posterior cervical decompression and stabilization as well. MRI was repeated and it shows residual posterior cervical compression at the C3-4 level good ventral decompression at both C3-4 and C4-5 no residual posterior compression at C4-5. There was slightly less increased signal in this tunnel cord at the C3-4 level, on the repeat MRI. It is felt that there still significant stenosis at C3-4 level, less severe than it was prior to his anterior surgery, but sufficient that we should proceed with posterior cervical decompression and stabilization. Patient is admitted now for a C3 and C4 posterior cervical laminectomy and a C2-C5 posterior cervical arthrodesis with laminar screws at C2, lateral mass screws at C3, C4 and C5 with the rods and bone graft.   Patient Active Problem List  Diagnoses Date Noted  . DVT (deep venous thrombosis) 02/02/2012  . Amyloidosis 10/01/2011  . Multiple myeloma 10/01/2011  . Neuropathy 10/01/2011  . Sleep apnea   . Dyslipidemia    Past Medical History  Diagnosis Date  . Prostate cancer   . Sleep apnea   . Dyslipidemia   . H/O multiple myeloma   . Spastic quadriparesis   . DVT (deep venous thrombosis)     right  . Arthritis   . Pneumonia     hx of with last time in Nov 2012  . Joint pain     Past Surgical History  Procedure Date  . Prostatectomy around 2005  . Posterior laminectomy / decompression lumbar spine around 2007    L4-5  . Carpal tunnel release 2009    left  . Back surgery   . Anterior cervical decomp/discectomy fusion 02/26/2012    Procedure: ANTERIOR CERVICAL DECOMPRESSION/DISCECTOMY FUSION 2 LEVELS;   Surgeon: Hewitt Shorts, MD;  Location: [facility];  Service: Neurosurgery;  Laterality: N/A;  C3-4 C4-5 Anterior cervical decompression/diskectomy, fusion  . Tonsillectomy     as a child  . Hernia repair     around 1985  . Cardiac catheterization 2010  . Colonoscopy   . Lipoma excision around 1985    removed from one of his shoulders  . Ivt 2013    filter placed d/t blood clots    Prescriptions prior to admission  Medication Sig Dispense Refill  . acyclovir (ZOVIRAX) 400 MG tablet Take 1 tablet (400 mg total) by mouth 2 (two) times daily.  60 tablet  3  . aspirin EC 81 MG tablet Take 81 mg by mouth daily.      . calcium citrate (CALCITRATE - DOSED IN MG ELEMENTAL CALCIUM) 950 MG tablet Take 1 tablet by mouth daily.      . ciprofloxacin (CIPRO) 500 MG tablet Take 1 tablet (500 mg total) by mouth every 12 (twelve) hours.  14 tablet  0  . enoxaparin (LOVENOX) 150 MG/ML injection Inject 0.87 mLs (130 mg total) into the skin daily.  30 Syringe  1  . lenalidomide (REVLIMID) 15 MG capsule Take 1 capsule (15 mg total) by mouth daily. Auth # [id number] [employer]  21 capsule  0  . prochlorperazine (COMPAZINE) 10 MG tablet Take 10 mg by mouth every 6 (six) hours as needed. For nausea/vomiting      . sennosides-docusate sodium (  SENOKOT-S) 8.6-50 MG tablet Take 2 tablets by mouth daily.        No Known Allergies  History  Substance Use Topics  . Smoking status: Former Games developer  . Smokeless tobacco: Not on file   Comment: quit 25+yrs ago  . Alcohol Use: 0.6 oz/week    1 Glasses of wine per week     occasionally    Family History  Problem Relation Age of Onset  . Anesthesia problems Neg Hx   . Hypotension Neg Hx   . Malignant hyperthermia Neg Hx   . Pseudochol deficiency Neg Hx      Review of Systems A comprehensive review of systems was negative.  Objective: Vital signs in last 24 hours: Temp:  [97.7 F (36.5 C)] 97.7 F (36.5 C) (05/16 0624) Pulse Rate:  [68-77] 68  (05/16  0624) Resp:  [18] 18  (05/16 0624) BP: (106-117)/(68-74) 106/68 mmHg (05/16 0624) SpO2:  [98 %] 98 % (05/16 0624) Weight:  [88.814 kg (195 lb 12.8 oz)] 88.814 kg (195 lb 12.8 oz) (05/15 1017)  EXAM: Patient is a well-developed well-nourished white male in no acute distress. Lungs are clear to auscultation , the patient has symmetrical respiratory excursion. Breath sounds are somewhat distant. Heart has a regular rate and rhythm normal S1 and S2 no murmur.  Heart sounds are somewhat distant. Abdomen is soft nontender nondistended bowel sounds are present. Extremity examination shows no clubbing or cyanosis, but mild to moderate edema in the right leg and foot, no edema in the left leg or foot. Motor examination shows persistence of quadrant Harris is. Deltoids are 4+ to 5 bilaterally. Left biceps is 4, right biceps is 4 minus. Triceps are 4 minus bilaterally. Intrinsics 4 bilaterally. Grips are 4 bony. Iliopsoas is 4 minus bilaterally. Sensation is decreased to pinprick worse his hands and his feet. Reflexes are 3 at the biceps and brachialis bilaterally left triceps to the right triceps is 1-2. Toes are downgoing bilaterally. He has mild stiffness in spasticity with his gait and stance.   Data Review:CBC    Component Value Date/Time   WBC 6.5 03/31/2012 1821   WBC 4.1 02/21/2012 0839   RBC 3.92* 03/31/2012 1821   RBC 3.78* 02/21/2012 0839   HGB 12.3* 03/31/2012 1821   HGB 12.1* 02/21/2012 0839   HCT 36.2* 03/31/2012 1821   HCT 35.9* 02/21/2012 0839   PLT 179 03/31/2012 1821   PLT 167 02/21/2012 0839   MCV 92.3 03/31/2012 1821   MCV 95.0 02/21/2012 0839   MCH 31.4 03/31/2012 1821   MCH 32.1 02/21/2012 0839   MCHC 34.0 03/31/2012 1821   MCHC 33.8 02/21/2012 0839   RDW 15.6* 03/31/2012 1821   RDW 14.7* 02/21/2012 0839   LYMPHSABS 1.8 03/31/2012 1821   LYMPHSABS 1.3 02/21/2012 0839   MONOABS 1.0 03/31/2012 1821   MONOABS 0.9 02/21/2012 0839   EOSABS 0.2 03/31/2012 1821   EOSABS 0.4 02/21/2012 0839   BASOSABS 0.0  03/31/2012 1821   BASOSABS 0.1 02/21/2012 0839                          BMET    Component Value Date/Time   NA 144 03/31/2012 1821   K 4.2 03/31/2012 1821   CL 107 03/31/2012 1821   CO2 28 03/31/2012 1821   GLUCOSE 79 03/31/2012 1821   BUN 16 03/31/2012 1821   CREATININE 0.94 03/31/2012 1821   CALCIUM 9.9 03/31/2012 1821   GFRNONAA  85* 03/31/2012 1821   GFRAA >90 03/31/2012 1821     Assessment/Plan: Patient with cervical spondylosis and degenerative disc disease with resulting cervical stenosis and resultant myelopathy with quadriparesis. He is 6 weeks status post a C3-4 C4-5 ACDF, but has residual stenosis at the C3-4 level and therefore is admitted for posterior cervical decompression and stabilization. I discussed the nature condition the nature the surgical procedure with the patient. We discussed typical for surgery, hospital stay, and overall recuperation. We have discussed risks of surgery including risks of infection, bleeding, possibly transfusion, risk of spinal cord dysfunction with paralysis of all 4 limbs and quadrant plegic, the risk of nerve dysfunction with pain, weakness, numbness, or paresthesias, roof discussed the risk of Ferrier of the arthrodesis and possible need for further surgery, and anesthetic risks of myocardial infarction, stroke, and pneumonia, and death. Understanding all of this he wishes to proceed with surgery and is admitted for such.   Hewitt Shorts, MD 04/06/2012 7:13 AM

## 2012-04-06 NOTE — Progress Notes (Deleted)
Error in charting. Wrong patient

## 2012-04-06 NOTE — Progress Notes (Signed)
Upon arrival to 3534, pt c/o discomfort in his left eye, "like it's scratched".  Sclera is a little red.  I will let dr Noreene Larsson know about this.

## 2012-04-06 NOTE — Preoperative (Signed)
Beta Blockers   Reason not to administer Beta Blockers:Not Applicable 

## 2012-04-06 NOTE — Progress Notes (Signed)
Utilization review completed. Physician order obtained for inpatient

## 2012-04-06 NOTE — Addendum Note (Signed)
Addendum  created 04/06/12 1633 by Kipp Brood, MD   Modules edited:Notes Section

## 2012-04-06 NOTE — Op Note (Signed)
04/06/2012  11:36 AM  PATIENT:  Brent Allison  66 y.o. male  PRE-OPERATIVE DIAGNOSIS:  cervical stenosis, cervical spondylosis with myelopathy, cervical degenerative disc disease, quadreparesis  POST-OPERATIVE DIAGNOSIS:  cervical stenosis, cervical spondylosis with myelopathy, cervical degenerative disc disease, quadreparesis  PROCEDURE:  Procedure(s): POSTERIOR CERVICAL FUSION/FORAMINOTOMY LEVEL 3: C3, C4, and C5 posterior cervical laminectomy, C2-C5 posterior lateral cervical arthrodesis with C2 laminar screws and lateral mass screws at C3, C4, and C5 with locally harvested morcellized autograft and Vitoss BA  SURGEON:  Surgeon(s): Hewitt Shorts, MD Maeola Harman, MD  ASSISTANTS: Maeola Harman, M.D.  ANESTHESIA:   general  EBL:  Total I/O In: 1750 [I.V.:1000; IV Piggyback:750] Out: 615 [Urine:340; Blood:275]  BLOOD ADMINISTERED:none  COUNT: Correct per nursing staff  DRAINS: Medium Hemovac   DICTATION: Patient brought the operating room and placed under general endotracheal anesthesia. The 3 pin Mayfield head holder was applied and the patient was turned to a prone position. The occipital scalp was shaved. The occipital scalp posterior neck and upper back were prepped with Betadine soap and solution and draped in a sterile fashion. The midline was infiltrated with local anesthetic with epinephrine. Midline incision made carried down to subcutaneous tissue. Bipolar cautery and a lot of cautery used to maintain hemostasis. Dissection was carried down to the ulcer cervical fascia which was incised on each side of the midline. Paracervical musculature was dissected from the spinous process and lamina in a subperiosteal fashion. C-arm fluoroscope was draped and brought in the field and we localize the C2, C3, C4, and C5 spinous process lamina. The C-arm was used throughout the placement of laminar and lateral mass screws. We first placed C2 laminar screws. Pilot holes were made with  the high-speed drill. We then drilled to the lamina with a 2.05 mm drill. It was examined with the ball probe, good bony surfaces were found. It was then tapped with a 3.0 mm tap. We placed a 3.5 x 20 mm screw from the left towards the right and a 3.5 x 60 mm screw for the right towards the left. We then placed lateral mass screws bilaterally at C3, C4, and C5. For each screw up hilar hole was made with the high-speed drill, we then drilled in the superior lateral trajectory with a 2.05 mm drill. Each drill hole was examined with ball probe, good bony surfaces were found. Each was then tapped with a 3.0 mm tap. And then we placed 3.5 x 14 mm screws bilaterally at each level. We then proceeded with the decompressive cervical laminectomy. Using magnification a microsurgical technique a laminectomy was performed using double-action rongeurs, the high-speed drill, and Kerrison punches. Bone was saved to later be used as autograft. Care was taken to leave the underlying thecal sac undisturbed. The ligamentum flavum was markedly thickened at both the C3-4 and C4-5 levels and therefore was carefully removed.to remove the thickened ligament at the C4-5 level a superior C5 laminectomy was performed as well as a complete laminectomy at C3 and C4. Epidural bleeding was controlled with Gelfoam with thrombin. We then completed the assembling of the instrumentation construct using 2 acutely angled side connectors to connect between the pre-lordosed rods and the laminar screws at C2. We used 2 80 mm pre-lordosed rods which were cut to proper length. We also cut the side connectors to proper length. The construct was assembled bilaterally, secured with a total of 8 locking caps, and once all the locking caps were in place final tightening performed  against a counter torque for all 8 of the locking caps as well as for both of the side connectors.  We packed the facet joints and lateral gutters with a combination of local harvested  morcellized autograft and Vitoss BA.the wound had been irrigated numerous times the procedure with saline solution and bacitracin solution. Good hemostasis was established, but because it had been a significant amount of oozing through the case we did place a medium Hemovac drain, which was brought out through a separate stab incision. He was secured to the skin with a 3-0 nylon suture. The paracervical musculature was passed with interrupted undyed 0 Vicryl sutures, the deep fascia closed with interrupted undyed 0 Vicryl sutures, the Scarpa's fascia was closed with interrupted undyed 0 Vicryl sutures. The subcutaneous subcuticular closed with interrupted inverted 2-0 Vicryl sutures. Skin closed with a Dermabond. We'll was dressed with sterile gauze and Hypafix. Following surgery the patient was turned back to the supine position to be reversed an anesthetic, extubated, and transferred to the recovery room for further care.   PLAN OF CARE: Admit for overnight observation  PATIENT DISPOSITION:  PACU - hemodynamically stable.   Delay start of Pharmacological VTE agent (>24hrs) due to surgical blood loss or risk of bleeding:  yes

## 2012-04-07 MED ORDER — MUPIROCIN 2 % EX OINT
TOPICAL_OINTMENT | Freq: Two times a day (BID) | CUTANEOUS | Status: DC
  Administered 2012-04-07 (×2): via NASAL
  Filled 2012-04-07: qty 22

## 2012-04-07 NOTE — Plan of Care (Signed)
Problem: Consults Goal: Diagnosis - Spinal Surgery Outcome: Completed/Met Date Met:  04/07/12 Cervical Spine Fusion

## 2012-04-07 NOTE — Discharge Instructions (Signed)

## 2012-04-07 NOTE — Progress Notes (Signed)
Filed Vitals:   04/06/12 1642 04/06/12 2024 04/06/12 2353 04/07/12 0358  BP: 117/73 111/67 99/62 99/60   Pulse: 82  71 79  Temp: 97.6 F (36.4 C) 97.4 F (36.3 C) 97.9 F (36.6 C) 97.5 F (36.4 C)  TempSrc:  Oral Oral Oral  Resp: 16  14 16   SpO2: 100% 95% 97% 98%    Patient resting in bed. His Foley has been discontinued and he has been voiding. Ambulating in halls. Moderate drainage into Hemovac drain overnight, 100 cc. We'll plan on discontinuing it in a.m. Encouraged to ambulate in the halls.   Plan: Encourage ambulation. DC drain a.m.   Hewitt Shorts, MD 04/07/2012, 8:08 AM  8

## 2012-04-08 MED ORDER — ACYCLOVIR 200 MG PO CAPS
400.0000 mg | ORAL_CAPSULE | Freq: Two times a day (BID) | ORAL | Status: DC
  Filled 2012-04-08 (×6): qty 2

## 2012-04-08 NOTE — Progress Notes (Signed)
RT called to assist pt with setting up home CPAP machine. Machine plugged in for pt and he was able to place mask on his-self and turn on the machine. RN aware.

## 2012-04-08 NOTE — Discharge Summary (Signed)
Physician Discharge Summary  Patient ID: Brent Allison MRN: [id number] DOB/AGE: 1946/10/04 66 y.o.  Admit date: 04/06/2012 Discharge date: 04/08/2012  Admission Diagnoses:c34 stenosis. S/p anterior fusion  Discharge Diagnoses: same   Discharged Condition: stable  Hospital Course:surgery 04/06/12  Consults: none  Significant Diagnostic Studies: mri  Treatments: surgery  Discharge Exam: Blood pressure 121/68, pulse 87, temperature 98 F (36.7 C), temperature source Oral, resp. rate 18, SpO2 99.00%. Incisional pain  Disposition: home on percocet and diazepam  Discharge Orders    Future Appointments: Provider: Department: Dept Phone: Center:   04/26/2012 11:30 AM Ladene Artist, MD [facility] [phone] None     Medication List  As of 04/08/2012  9:11 AM   ASK your doctor about these medications         acyclovir 400 MG tablet   Commonly known as: ZOVIRAX   Take 1 tablet (400 mg total) by mouth 2 (two) times daily.      aspirin EC 81 MG tablet   Take 81 mg by mouth daily.      calcium citrate 950 MG tablet   Commonly known as: CALCITRATE - dosed in mg elemental calcium   Take 1 tablet by mouth daily.      ciprofloxacin 500 MG tablet   Commonly known as: CIPRO   Take 1 tablet (500 mg total) by mouth every 12 (twelve) hours.      enoxaparin 150 MG/ML injection   Commonly known as: LOVENOX   Inject 0.87 mLs (130 mg total) into the skin daily.      lenalidomide 15 MG capsule   Commonly known as: REVLIMID   Take 1 capsule (15 mg total) by mouth daily. Auth # [id number]  [employer]      prochlorperazine 10 MG tablet   Commonly known as: COMPAZINE   Take 10 mg by mouth every 6 (six) hours as needed. For nausea/vomiting      sennosides-docusate sodium 8.6-50 MG tablet   Commonly known as: SENOKOT-S   Take 2 tablets by mouth daily.             Signed: Karn Cassis 04/08/2012, 9:11 AM

## 2012-04-11 ENCOUNTER — Encounter (HOSPITAL_COMMUNITY): Payer: Self-pay | Admitting: Neurosurgery

## 2012-04-12 ENCOUNTER — Encounter (HOSPITAL_COMMUNITY): Payer: Self-pay

## 2012-04-19 ENCOUNTER — Other Ambulatory Visit: Payer: Self-pay | Admitting: *Deleted

## 2012-04-19 ENCOUNTER — Telehealth: Payer: Self-pay | Admitting: *Deleted

## 2012-04-19 NOTE — Telephone Encounter (Signed)
Call from pt requesting to reschedule his MD appt to week of 05/01/12. Would like to see Dr. Truett Perna after he sees Dr. Newell Coral. OK, per Dr. Truett Perna. Request sent to schedulers.

## 2012-04-20 ENCOUNTER — Telehealth: Payer: Self-pay | Admitting: Oncology

## 2012-04-20 NOTE — Telephone Encounter (Signed)
called pts home lmovm that his appt on 06/05 was moved to 06/10. asked that pt rtn call to confirm appt.

## 2012-04-25 ENCOUNTER — Other Ambulatory Visit: Payer: Self-pay | Admitting: *Deleted

## 2012-04-25 DIAGNOSIS — C9000 Multiple myeloma not having achieved remission: Secondary | ICD-10-CM

## 2012-04-25 MED ORDER — ACYCLOVIR 400 MG PO TABS: 400.0000 mg | ORAL_TABLET | Freq: Two times a day (BID) | ORAL | Status: DC

## 2012-04-26 ENCOUNTER — Ambulatory Visit: Payer: Medicare Other | Admitting: Oncology

## 2012-05-01 ENCOUNTER — Ambulatory Visit (HOSPITAL_BASED_OUTPATIENT_CLINIC_OR_DEPARTMENT_OTHER): Payer: Medicare Other | Admitting: Nurse Practitioner

## 2012-05-01 ENCOUNTER — Telehealth: Payer: Self-pay | Admitting: Oncology

## 2012-05-01 VITALS — BP 113/66 | HR 92 | Temp 98.2°F | Ht 73.0 in | Wt 190.0 lb

## 2012-05-01 DIAGNOSIS — E859 Amyloidosis, unspecified: Secondary | ICD-10-CM

## 2012-05-01 DIAGNOSIS — I82409 Acute embolism and thrombosis of unspecified deep veins of unspecified lower extremity: Secondary | ICD-10-CM

## 2012-05-01 NOTE — Progress Notes (Signed)
OFFICE PROGRESS NOTE  Interval history:  Brent Allison returns as scheduled. He underwent a second cervical spine surgery on 04/06/2012. He notes further improvement in his upper body strength and neuropathy symptoms. He continues to note some weakness in the upper legs. He overall is ambulating without difficulty. He feels "weak and tired". Appetite is poor. He continues Lovenox. He denies bleeding.   Objective: Blood pressure 113/66, pulse 92, temperature 98.2 F (36.8 C), temperature source Oral, height 6\' 1"  (1.854 m), weight 190 lb (86.183 kg).  Small ecchymosis at the right eyelid. Lungs clear. Regular cardiac rhythm. Abdomen soft and nontender. No organomegaly. Trace edema at the lower legs bilaterally. Mild weakness at the proximal legs bilaterally. Motor strength otherwise intact. Vibratory sense is moderately decreased at the left fingertips and intact at the right fingertips per tuning fork exam.  Lab Results: Lab Results  Component Value Date   WBC 6.5 03/31/2012   HGB 12.3* 03/31/2012   HCT 36.2* 03/31/2012   MCV 92.3 03/31/2012   PLT 179 03/31/2012    Chemistry:    Chemistry      Component Value Date/Time   NA 144 03/31/2012 1821   K 4.2 03/31/2012 1821   CL 107 03/31/2012 1821   CO2 28 03/31/2012 1821   BUN 16 03/31/2012 1821   CREATININE 0.94 03/31/2012 1821      Component Value Date/Time   CALCIUM 9.9 03/31/2012 1821   ALKPHOS 70 03/31/2012 1821   AST 12 03/31/2012 1821   ALT 10 03/31/2012 1821   BILITOT 0.3 03/31/2012 1821       Studies/Results: Dg Cervical Spine 2-3 Views  04/06/2012  *RADIOLOGY REPORT*  Clinical Data: Neck pain  DG C-ARM 1-60 MIN,CERVICAL SPINE - 2-3 VIEW  Comparison: MRI cervical spine 03/28/2012  Findings: C-arm films document placement of posterior screws in preparation for C2-C5 posterior fusion.  No visible adverse features.  IMPRESSION: As above.  Original Report Authenticated By: Elsie Stain, M.D.   Gna Rad Results  04/04/2012  Ordered by an  unspecified provider.   Dg C-arm 1-60 Min  04/06/2012  *RADIOLOGY REPORT*  Clinical Data: Neck pain  DG C-ARM 1-60 MIN,CERVICAL SPINE - 2-3 VIEW  Comparison: MRI cervical spine 03/28/2012  Findings: C-arm films document placement of posterior screws in preparation for C2-C5 posterior fusion.  No visible adverse features.  IMPRESSION: As above.  Original Report Authenticated By: Elsie Stain, M.D.    Medications: I have reviewed the patient's current medications.  Assessment/Plan:  1. 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 2. "Bruising" at the eyelids and mouth: Likely related to amyloidosis, persistent. 3. Numbness and loss of vibratory sense at the fingertips: This predated Velcade-based therapy, but worsened. 4. Elevated serum free lambda light chains. The lambda light chains were lower on November 16 and slightly higher on 12/14/2011. 5. Lambda light chain proteinuria. 6. Bone marrow plasmacytosis: Variable increase in plasma cells noted on the bone marrow biopsy 07/29/2011 with plasma cells estimated to represent between 4% and 20% of the cellular population. 7. Remote history of prostate cancer. 8. Sleep apnea. 9. Dyslipidemia. 10. Report of pneumonia on 2 occasions in 2011. 11. Admission to a hospital in [place], [place] October 2012 with "pneumonia." A chest x-ray at [facility] on November 2 was negative .  12. Low serum immunoglobulin G level. 13. Borderline-low hemoglobin level, improved. 14. Plasma cell dyscrasia with associated amyloidosis.  a. Initiation of systemic therapy with Cytoxan, Velcade, and Decadron 08/12/2011. Cycle #2 was initiated  on 09/16/2011. Cycle #3 was initiated on 10/15/2011. Velcade was placed on hold due to neuropathy. b. The serum free lambda light chains were decreased on October 08 2011. c. The serum free lambda light chains were slightly increased on 12/14/2011, lower on 12/29/2011 and 02/02/2012. d. Initiation of Revlimid/Decadron  February 2013, cycle 2 started on 01/28/2012. 15. Loss of "balance "and proximal motor weakness secondary to cervical stenosis-status post decompression surgery on 02/19/2012. Improved, but not resolved stenosis with mass effect on the spinal cord noted on a repeat MRI 03/28/2012. He underwent further decompression surgery on 04/06/2012.  16. Right lower showed a deep vein thrombosis-a Doppler ultrasound confirmed a gastrocnemius and peroneal deep vein thrombosis. An IVC filter was placed prior to surgery and he is maintained on the Lovenox. We are referring him for IVC filter retrieval. He will continue Lovenox for now. 17. Anorexia following cervical spine surgery. Persists. 18. Episode of flank pain and hematuria in 03/31/2012-etiology unclear. He completed a course of antibiotics. The hematuria and pain have resolved.  19. Rectal wall thickening noted on a CT of the pelvis 03/31/2012. Dr. Truett Perna recommends a referral for a colonoscopy. 20. Lung nodule noted on the CT 03/31/2012-we will consider obtaining a followup CT in 3-6 months.  Disposition-Brent Allison appears stable from a hematologic standpoint. Dr. Truett Perna recommends following on an observation approach for now with regard to the amyloidosis. We are referring him for removal of the IVC filter as well as for a colonoscopy. He will continue Lovenox at present with plans to transition to Coumadin once both procedures have been completed. He will return for a followup visit with Dr. Truett Perna in 3 weeks. He will contact the office in the interim with any problems.  Plan reviewed with Dr. Truett Perna.   Lonna Cobb ANP/GNP-BC

## 2012-05-01 NOTE — Telephone Encounter (Signed)
Gave  Pt appt for IR on Thursday  05/04/12 faxed notes to Dr. Bosie Clos for GI consult they will call patient. Pt will see MD in July 2013

## 2012-05-01 NOTE — Telephone Encounter (Signed)
Gave pt appt calendar, for July 2013 MD only, faxed notes to GI @ [facility] for review

## 2012-05-02 ENCOUNTER — Encounter (HOSPITAL_COMMUNITY): Payer: Self-pay | Admitting: Pharmacy Technician

## 2012-05-03 ENCOUNTER — Other Ambulatory Visit: Payer: Self-pay | Admitting: Radiology

## 2012-05-04 ENCOUNTER — Ambulatory Visit (HOSPITAL_COMMUNITY)
Admission: RE | Admit: 2012-05-04 | Discharge: 2012-05-04 | Disposition: A | Discharge status: Home / Self Care | Payer: Medicare Other | Source: Ambulatory Visit | Attending: Interventional Radiology | Admitting: Interventional Radiology

## 2012-05-04 ENCOUNTER — Ambulatory Visit (HOSPITAL_COMMUNITY)
Admission: RE | Admit: 2012-05-04 | Discharge: 2012-05-04 | Disposition: A | Discharge status: Home / Self Care | Payer: Medicare Other | Source: Ambulatory Visit | Attending: Nurse Practitioner | Admitting: Nurse Practitioner

## 2012-05-04 DIAGNOSIS — Z7901 Long term (current) use of anticoagulants: Secondary | ICD-10-CM | POA: Insufficient documentation

## 2012-05-04 DIAGNOSIS — Z4689 Encounter for fitting and adjustment of other specified devices: Secondary | ICD-10-CM | POA: Insufficient documentation

## 2012-05-04 DIAGNOSIS — E859 Amyloidosis, unspecified: Secondary | ICD-10-CM

## 2012-05-04 DIAGNOSIS — Z86718 Personal history of other venous thrombosis and embolism: Secondary | ICD-10-CM | POA: Insufficient documentation

## 2012-05-04 DIAGNOSIS — I82409 Acute embolism and thrombosis of unspecified deep veins of unspecified lower extremity: Secondary | ICD-10-CM

## 2012-05-04 LAB — CBC
HCT: 34.4 % — ABNORMAL LOW (ref 39.0–52.0)
Hemoglobin: 11.2 g/dL — ABNORMAL LOW (ref 13.0–17.0)
MCH: 30.2 pg (ref 26.0–34.0)
MCHC: 32.6 g/dL (ref 30.0–36.0)
MCV: 92.7 fL (ref 78.0–100.0)
Platelets: 198 10*3/uL (ref 150–400)
RBC: 3.71 MIL/uL — ABNORMAL LOW (ref 4.22–5.81)
RDW: 16.7 % — ABNORMAL HIGH (ref 11.5–15.5)
WBC: 6.1 10*3/uL (ref 4.0–10.5)

## 2012-05-04 LAB — BASIC METABOLIC PANEL
BUN: 13 mg/dL (ref 6–23)
CO2: 28 mEq/L (ref 19–32)
Calcium: 10.1 mg/dL (ref 8.4–10.5)
Chloride: 105 mEq/L (ref 96–112)
Creatinine, Ser: 0.83 mg/dL (ref 0.50–1.35)
GFR calc Af Amer: >90 mL/min (ref >90)
GFR calc non Af Amer: 89 mL/min — ABNORMAL LOW (ref >90)
Glucose, Bld: 90 mg/dL (ref 70–99)
Potassium: 3.8 mEq/L (ref 3.5–5.1)
Sodium: 140 mEq/L (ref 135–145)

## 2012-05-04 LAB — APTT: aPTT: 32 seconds (ref 24–37)

## 2012-05-04 LAB — PROTIME-INR
INR: 1.19 (ref 0.00–1.49)
Prothrombin Time: 15.4 seconds — ABNORMAL HIGH (ref 11.6–15.2)

## 2012-05-04 MED ORDER — MIDAZOLAM HCL 2 MG/2ML IJ SOLN
INTRAMUSCULAR | Status: AC
  Filled 2012-05-04: qty 6

## 2012-05-04 MED ORDER — FENTANYL CITRATE 0.05 MG/ML IJ SOLN
INTRAMUSCULAR | Status: AC
  Filled 2012-05-04: qty 6

## 2012-05-04 MED ORDER — IOHEXOL 300 MG/ML  SOLN
30.0000 mL | Freq: Once | INTRAMUSCULAR | Status: AC | PRN
  Administered 2012-05-04: 30 mL via INTRAVENOUS

## 2012-05-04 MED ORDER — MIDAZOLAM HCL 5 MG/5ML IJ SOLN
INTRAMUSCULAR | Status: AC | PRN
  Administered 2012-05-04: 1 mg via INTRAVENOUS

## 2012-05-04 MED ORDER — FENTANYL CITRATE 0.05 MG/ML IJ SOLN
INTRAMUSCULAR | Status: AC | PRN
  Administered 2012-05-04: 50 ug via INTRAVENOUS

## 2012-05-04 MED ORDER — LIDOCAINE HCL 1 % IJ SOLN
INTRAMUSCULAR | Status: AC
  Filled 2012-05-04: qty 20

## 2012-05-04 MED ORDER — SODIUM CHLORIDE 0.9 % IV SOLN
INTRAVENOUS | Status: DC
  Administered 2012-05-04: 14:00:00 via INTRAVENOUS

## 2012-05-04 NOTE — H&P (Signed)
Brent Allison is an 66 y.o. male.   Chief Complaint: Hx of DVT, had retrievable IVC filter placed for cervical spine surgery.  Patient tolerated well and no longer needs the filter.  HPI: Hx of amyloid and DVT s/p IVC filter placement 03/04/12.  See note from oncology visit below.   Rana Snare, NP Nurse Practitioner Signed  Progress Notes 05/01/2012 4:19 PM  Related encounter: Office Visit from 05/01/2012 in [facility]  OFFICE PROGRESS NOTE  Interval history:   Brent Allison returns as scheduled. He underwent a second cervical spine surgery on 04/06/2012. He notes further improvement in his upper body strength and neuropathy symptoms. He continues to note some weakness in the upper legs. He overall is ambulating without difficulty. He feels "weak and tired". Appetite is poor. He continues Lovenox. He denies bleeding.   Objective: Blood pressure 113/66, pulse 92, temperature 98.2 F (36.8 C), temperature source Oral, height 6\' 1"  (1.854 m), weight 190 lb (86.183 kg).  Small ecchymosis at the right eyelid. Lungs clear. Regular cardiac rhythm. Abdomen soft and nontender. No organomegaly. Trace edema at the lower legs bilaterally. Mild weakness at the proximal legs bilaterally. Motor strength otherwise intact. Vibratory sense is moderately decreased at the left fingertips and intact at the right fingertips per tuning fork exam.  Lab Results: Lab Results   Component  Value  Date     WBC  6.5  03/31/2012     HGB  12.3*  03/31/2012     HCT  36.2*  03/31/2012     MCV  92.3  03/31/2012     PLT  179  03/31/2012     Chemistry:        Chemistry        Component  Value  Date/Time     NA  144  03/31/2012 1821     K  4.2  03/31/2012 1821     CL  107  03/31/2012 1821     CO2  28  03/31/2012 1821     BUN  16  03/31/2012 1821     CREATININE  0.94  03/31/2012 1821        Component  Value  Date/Time     CALCIUM  9.9  03/31/2012 1821     ALKPHOS  70  03/31/2012 1821     AST   12  03/31/2012 1821     ALT  10  03/31/2012 1821     BILITOT  0.3  03/31/2012 1821         Studies/Results: Dg Cervical Spine 2-3 Views  04/06/2012  *RADIOLOGY REPORT*  Clinical Data: Neck pain  DG C-ARM 1-60 MIN,CERVICAL SPINE - 2-3 VIEW  Comparison: MRI cervical spine 03/28/2012  Findings: C-arm films document placement of posterior screws in preparation for C2-C5 posterior fusion.  No visible adverse features.  IMPRESSION: As above.  Original Report Authenticated By: Elsie Stain, M.D.   Gna Rad Results  04/04/2012  Ordered by an unspecified provider.   Dg C-arm 1-60 Min  04/06/2012  *RADIOLOGY REPORT*  Clinical Data: Neck pain  DG C-ARM 1-60 MIN,CERVICAL SPINE - 2-3 VIEW  Comparison: MRI cervical spine 03/28/2012  Findings: C-arm films document placement of posterior screws in preparation for C2-C5 posterior fusion.  No visible adverse features.  IMPRESSION: As above.  Original Report Authenticated By: Elsie Stain, M.D.    Medications: I have reviewed the patient's current medications.  Assessment/Plan:    1. Amyloid  involving an eyelid biopsy 06/10/2011.  2. "Bruising" at the eyelids and mouth: Likely related to amyloidosis, persistent.  3. Numbness and loss of vibratory sense at the fingertips: This predated Velcade-based therapy, but worsened.  4. Elevated serum free lambda light chains. The lambda light chains were lower on November 16 and slightly higher on 12/14/2011.  5. Lambda light chain proteinuria.  6. Bone marrow plasmacytosis: Variable increase in plasma cells noted on the bone marrow biopsy 07/29/2011 with plasma cells estimated to represent between 4% and 20% of the cellular population.  7. Remote history of prostate cancer.  8. Sleep apnea.  9. Dyslipidemia.  10. Report of pneumonia on 2 occasions in 2011.  11. Admission to a hospital in [place], [place] October 2012 with "pneumonia." A chest x-ray at [facility] on November 2 was negative .    12. Low  serum immunoglobulin G level.  13. Borderline-low hemoglobin level, improved.  14. Plasma cell dyscrasia with associated amyloidosis.   a. Initiation of systemic therapy with Cytoxan, Velcade, and Decadron 08/12/2011. Cycle #2 was initiated on 09/16/2011. Cycle #3 was initiated on 10/15/2011. Velcade was placed on hold due to neuropathy.  b. The serum free lambda light chains were decreased on October 08 2011.  c. The serum free lambda light chains were slightly increased on 12/14/2011, lower on 12/29/2011 and 02/02/2012.  d. Initiation of Revlimid/Decadron February 2013, cycle 2 started on 01/28/2012. 15. Loss of "balance "and proximal motor weakness secondary to cervical stenosis-status post decompression surgery on 02/19/2012. Improved, but not resolved stenosis with mass effect on the spinal cord noted on a repeat MRI 03/28/2012. He underwent further decompression surgery on 04/06/2012.   16. Right lower showed a deep vein thrombosis-a Doppler ultrasound confirmed a gastrocnemius and peroneal deep vein thrombosis. An IVC filter was placed prior to surgery and he is maintained on the Lovenox. We are referring him for IVC filter retrieval. He will continue Lovenox for now. 17. Anorexia following cervical spine surgery. Persists. 18. Episode of flank pain and hematuria in 03/31/2012-etiology unclear. He completed a course of antibiotics. The hematuria and pain have resolved.   19. Rectal wall thickening noted on a CT of the pelvis 03/31/2012. Dr. Truett Perna recommends a referral for a colonoscopy. 20. Lung nodule noted on the CT 03/31/2012-we will consider obtaining a followup CT in 3-6 months.  Disposition-[name] appears stable from a hematologic standpoint. Dr. Truett Perna recommends following on an observation approach for now with regard to the amyloidosis. We are referring him for removal of the IVC filter as well as for a colonoscopy. He will continue Lovenox at present with plans to transition  to Coumadin once both procedures have been completed. He will return for a followup visit with Dr. Truett Perna in 3 weeks. He will contact the office in the interim with any problems.  Plan reviewed with Dr. Truett Perna.   Lonna Cobb ANP/GNP-BC     Vascular report :   [facility]*               *[facility]*                       [address]                     [place], [place] [address]  [phone]   ------------------------------------------------------------ Noninvasive Vascular Lab  Right Lower Extremity Venous Duplex Evaluation  Patient:    Brent Allison, Brent Allison MR #:       [id number] Study Date: 02/02/2012 Gender:     M Age:        46 Height: Weight: BSA: Pt. Status: Room:    ATTENDING    Luvenia Heller, [place]  SONOGRAPHER  Thereasa Parkin, RVT Reports also to:  ------------------------------------------------------------ History and indications:  Indications  729.81 Swelling of limb.  451.0 Phlebitis and thrombophlebitis of superficial vessels of lower extremities.  History  Diagnostic evaluation. Righ calf and ankle swelling for 5 days. Multiple myeloma  ------------------------------------------------------------ Study information:  Study status:  Routine.  Procedure:  A vascular evaluation was performed with the patient in the supine position. The right common femoral, right femoral, right profunda femoral, right popliteal, right peroneal, and right posterior tibial veins were studied. Image quality was adequate.    Right lower extremity venous duplex evaluation.     Doppler flow study including B-mode compression maneuvers of all visualized segments, color flow Doppler and selected views of pulsed wave Doppler.  Location:  Vascular laboratory. Patient status:  Outpatient.  Venous flow:  +--------------------+---------------+---------------------+ Location             Overall        Flow properties       +--------------------+---------------+---------------------+ Right common femoralPatent         Phasic; spontaneous;                                     compressible          +--------------------+---------------+---------------------+ Right femoral       Patent         Compressible          +--------------------+---------------+---------------------+ Right profunda      Patent         Compressible          femoral                                                  +--------------------+---------------+---------------------+ Right popliteal     Patent         Phasic; spontaneous;                                     compressible          +--------------------+---------------+---------------------+ Right posterior     Patent         Compressible          tibial                                                   +--------------------+---------------+---------------------+ Right peroneal      Thrombosed     Noncompressible       +--------------------+---------------+---------------------+ Right saphenofemoralPatent         Compressible          junction                                                 +--------------------+---------------+---------------------+  Right gastrocnemius Partially      Noncompressible                           thrombosed                           +--------------------+---------------+---------------------+  ------------------------------------------------------------ Summary: Findings consistent with deep vein thrombosis involving the right peroneal vein and right gastrocnemius vein. All other veins patent.  Other specific details can be found in the table(s) above.    Prepared and Electronically Authenticated by  Josephina Gip, MD 2013-03-13T12:58:[id number]         Specimen Collected: 02/02/12 10:19 AM  Last Resulted    Note from today's visit : Past  Medical History  Diagnosis Date  . Prostate cancer   . Sleep apnea   . Dyslipidemia   . H/O multiple myeloma   . Spastic quadriparesis   . DVT (deep venous thrombosis)     right  . Arthritis   . Pneumonia     hx of with last time in Nov 2012  . Joint pain     Past Surgical History  Procedure Date  . Prostatectomy around 2005  . Posterior laminectomy / decompression lumbar spine around 2007    L4-5  . Carpal tunnel release 2009    left  . Back surgery   . Anterior cervical decomp/discectomy fusion 02/26/2012    Procedure: ANTERIOR CERVICAL DECOMPRESSION/DISCECTOMY FUSION 2 LEVELS;  Surgeon: Hewitt Shorts, MD;  Location: [facility];  Service: Neurosurgery;  Laterality: N/A;  C3-4 C4-5 Anterior cervical decompression/diskectomy, fusion  . Tonsillectomy     as a child  . Hernia repair     around 1985  . Cardiac catheterization 2010  . Colonoscopy   . Lipoma excision around 1985    removed from one of his shoulders  . Ivt 2013    filter placed d/t blood clots  . Posterior cervical fusion/foraminotomy 04/06/2012    Procedure: POSTERIOR CERVICAL FUSION/FORAMINOTOMY LEVEL 3;  Surgeon: Hewitt Shorts, MD;  Location: [facility];  Service: Neurosurgery;  Laterality: N/A;  C3 and C4 laminectomy with C23 C34 C45 posterior cervical arthrodesis with instrumentation    Family History  Problem Relation Age of Onset  . Anesthesia problems Neg Hx   . Hypotension Neg Hx   . Malignant hyperthermia Neg Hx   . Pseudochol deficiency Neg Hx    Social History:  reports that he has quit smoking. He does not have any smokeless tobacco history on file. He reports that he drinks about .6 ounces of alcohol per week. He reports that he does not use illicit drugs.  Allergies: No Known Allergies   Results for orders placed during the hospital encounter of 05/04/12 (from the past 48 hour(s))  APTT     Status: Normal   Collection Time   05/04/12 12:27 PM      Component Value Range Comment     aPTT 32  24 - 37 seconds   BASIC METABOLIC PANEL     Status: Abnormal   Collection Time   05/04/12 12:27 PM      Component Value Range Comment   Sodium 140  135 - 145 mEq/L    Potassium 3.8  3.5 - 5.1 mEq/L    Chloride 105  96 - 112 mEq/L    CO2 28  19 - 32 mEq/L    Glucose, Bld 90  70 - 99 mg/dL    BUN 13  6 - 23 mg/dL    Creatinine, Ser 1.61  0.50 - 1.35 mg/dL    Calcium 09.6  8.4 - 10.5 mg/dL    GFR calc non Af Amer 89 (*) >90 mL/min    GFR calc Af Amer >90  >90 mL/min   CBC     Status: Abnormal   Collection Time   05/04/12 12:27 PM      Component Value Range Comment   WBC 6.1  4.0 - 10.5 K/uL    RBC 3.71 (*) 4.22 - 5.81 MIL/uL    Hemoglobin 11.2 (*) 13.0 - 17.0 g/dL    HCT 04.5 (*) 40.9 - 52.0 %    MCV 92.7  78.0 - 100.0 fL    MCH 30.2  26.0 - 34.0 pg    MCHC 32.6  30.0 - 36.0 g/dL    RDW 81.1 (*) 91.4 - 15.5 %    Platelets 198  150 - 400 K/uL   PROTIME-INR     Status: Abnormal   Collection Time   05/04/12 12:27 PM      Component Value Range Comment   Prothrombin Time 15.4 (*) 11.6 - 15.2 seconds    INR 1.19  0.00 - 1.49     Review of Systems  Constitutional: Positive for malaise/fatigue. Negative for fever and chills.  HENT: Positive for neck pain.   Respiratory: Negative for cough, hemoptysis and sputum production.   Cardiovascular: Positive for leg swelling. Negative for chest pain, palpitations and claudication.  Gastrointestinal: Negative for heartburn, nausea, vomiting and abdominal pain.  Genitourinary: Negative.   Musculoskeletal: Positive for myalgias.  Skin: Positive for rash.       Skin lesions near orbits   Neurological: Positive for sensory change, focal weakness and weakness. Negative for seizures and loss of consciousness.    Physical Exam  Constitutional: He is oriented to person, place, and time. He appears well-developed and well-nourished. No distress.  HENT:  Head: Normocephalic and atraumatic.       Right eye lesions noted     Cardiovascular: Normal rate, regular rhythm and normal heart sounds.  Exam reveals no gallop and no friction rub.   No murmur heard. Respiratory: Effort normal and breath sounds normal. No respiratory distress. He has no wheezes. He has no rales.  GI: Soft. Bowel sounds are normal. He exhibits no distension.  Neurological: He is alert and oriented to person, place, and time.  Skin: Skin is warm and dry.  Psychiatric: He has a normal mood and affect. His behavior is normal. Judgment and thought content normal.     Assessment/Plan Procedure for IVC filter retrieval discussed with patient and spouse in detail.  Patient is tolerating his lovenox while off coumadin.  Procedure details and potential complications including but not limited to infection, bleeding, vessel damage, unsuccessful removal of filter and complications with moderate sedation discussed with their apparent understanding.  Written consent obtained. Labs WNL to proceed.   Pheonix Clinkscale D 05/04/2012, 1:34 PM

## 2012-05-04 NOTE — Discharge Instructions (Signed)
Call Dr. Kalman Drape office [phone]) if you have any problems or questions. May take bandage off tomorrow and shower. May put a regular bandaid on if you would like after that for another day.  Call MD if site has any redness, swelling, or drainage from site. If bleeding hold pressure. If severe  bleeding hold pressure call 911.

## 2012-05-04 NOTE — Procedures (Signed)
PROCEDURE: Cavogram and retrieval of IVC filter  Findings: No IVC thrombus.  Successful filter removal.  No complications.

## 2012-05-04 NOTE — H&P (Signed)
Agree 

## 2012-05-11 ENCOUNTER — Telehealth: Payer: Self-pay | Admitting: *Deleted

## 2012-05-11 NOTE — Telephone Encounter (Signed)
Received call from [facility]/Dr.Schooler stating pt is scheduled for colonoscopy 06/08/12 & request updated med list be faxed to [phone].  This will be done.  They also want to know if pt can hold his lovenox the day of the procedure or are there other recommendations.?  Note to Dr Truett Perna

## 2012-05-18 ENCOUNTER — Telehealth: Payer: Self-pay | Admitting: *Deleted

## 2012-05-18 NOTE — Telephone Encounter (Signed)
Appointment w/contractor 6/28 who has had shingles. Has been on antibiotic. Asking if OK for him to meet with him and any precautions?

## 2012-05-18 NOTE — Telephone Encounter (Signed)
Per Dr. Truett Perna: Be sure all lesions have crusted over and not draining and that he has been on antibiotic for at least 1 week. Patient notified.

## 2012-05-19 ENCOUNTER — Telehealth: Payer: Self-pay | Admitting: *Deleted

## 2012-05-19 NOTE — Telephone Encounter (Signed)
Need med list faxed and if OK to hold Lovenox day of procedure?  Med list and note faxed to office per Dr. Truett Perna: OK to Hold Lovenox day of Procedure Resume day of procedure if no biopsy performed or next day if he is OK with Dr. Bosie Clos.

## 2012-05-23 ENCOUNTER — Ambulatory Visit: Payer: Medicare Other | Admitting: Oncology

## 2012-05-29 ENCOUNTER — Other Ambulatory Visit: Payer: Self-pay | Admitting: *Deleted

## 2012-05-29 ENCOUNTER — Ambulatory Visit (HOSPITAL_BASED_OUTPATIENT_CLINIC_OR_DEPARTMENT_OTHER): Payer: Medicare Other | Admitting: Oncology

## 2012-05-29 VITALS — BP 104/54 | HR 73 | Temp 96.9°F | Ht 73.0 in | Wt 187.7 lb

## 2012-05-29 DIAGNOSIS — R918 Other nonspecific abnormal finding of lung field: Secondary | ICD-10-CM

## 2012-05-29 DIAGNOSIS — E8809 Other disorders of plasma-protein metabolism, not elsewhere classified: Secondary | ICD-10-CM

## 2012-05-29 DIAGNOSIS — E859 Amyloidosis, unspecified: Secondary | ICD-10-CM

## 2012-05-29 DIAGNOSIS — I82409 Acute embolism and thrombosis of unspecified deep veins of unspecified lower extremity: Secondary | ICD-10-CM

## 2012-05-29 DIAGNOSIS — C9000 Multiple myeloma not having achieved remission: Secondary | ICD-10-CM

## 2012-05-29 DIAGNOSIS — Z86718 Personal history of other venous thrombosis and embolism: Secondary | ICD-10-CM

## 2012-05-29 MED ORDER — ACYCLOVIR 400 MG PO TABS: 400.0000 mg | ORAL_TABLET | Freq: Two times a day (BID) | ORAL | Status: DC

## 2012-05-29 NOTE — Progress Notes (Signed)
[facility]    OFFICE PROGRESS NOTE   INTERVAL HISTORY:   He returns as scheduled. He reports a marketed improvement in the arm/hand and leg strength. The numbness is also improved. He is now able to button his shirt without difficulty. Brent Allison has developed low back pain. The pain radiates into the legs. The pain is worse when standing and with ambulation. He saw Dr. Newell Coral to evaluate this pain and will be scheduled for an MRI.  He continues to bruise easily, especially at the fingers. He continues Lovenox anticoagulation.  Objective:  Vital signs in last 24 hours:  Blood pressure 104/54, pulse 73, temperature 96.9 F (36.1 C), temperature source Oral, height 6\' 1"  (1.854 m), weight 187 lb 11.2 oz (85.14 kg).    HEENT: Ecchymoses at the buccal mucosa, macroglossia, a few ecchymoses at the eyelids Resp: Lungs clear bilaterally Cardio: Regular rate and rhythm GI: Nontender, no hepatosplenomegaly Vascular: No leg edema Neuro: The motor strength appears intact in the arms, hands, legs, and feet bilaterally  Skin: Ecchymoses at the low abdominal wall-sites of Lovenox injection  Musculoskeletal: No spine tenderness    Lab Results:  Lab Results  Component Value Date   WBC 6.1 05/04/2012   HGB 11.2* 05/04/2012   HCT 34.4* 05/04/2012   MCV 92.7 05/04/2012   PLT 198 05/04/2012      Medications: I have reviewed the patient's current medications.  Assessment/Plan: 1. 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 2. "Bruising" at the eyelids and mouth: Likely related to amyloidosis, persistent. 3. Numbness and loss of vibratory sense at the fingertips: This predated Velcade-based therapy, but worsened. Now much improved following cervical spine surgery. 4. Elevated serum free lambda light chains. The lambda light chains were lower on November 16 and slightly higher on 12/14/2011. 5. Lambda light chain proteinuria. 6. Bone marrow plasmacytosis: Variable  increase in plasma cells noted on the bone marrow biopsy 07/29/2011 with plasma cells estimated to represent between 4% and 20% of the cellular population. 7. Remote history of prostate cancer. 8. Sleep apnea. 9. Dyslipidemia. 10. Report of pneumonia on 2 occasions in 2011. 11. Admission to a hospital in [place], [place] October 2012 with "pneumonia." A chest x-ray at [facility] on November 2 was negative .  12. Low serum immunoglobulin G level. 13. Borderline-low hemoglobin level, improved. 14. Plasma cell dyscrasia with associated amyloidosis.  a. Initiation of systemic therapy with Cytoxan, Velcade, and Decadron 08/12/2011. Cycle #2 was initiated on 09/16/2011. Cycle #3 was initiated on 10/15/2011. Velcade was placed on hold due to neuropathy. b. The serum free lambda light chains were decreased on October 08 2011. c. The serum free lambda light chains were slightly increased on 12/14/2011, lower on 12/29/2011 and 02/02/2012. d. Initiation of Revlimid/Decadron February 2013, cycle 2 started on 01/28/2012. 15. Loss of "balance "and proximal motor weakness secondary to cervical stenosis-status post decompression surgery on 02/19/2012. Improved, but not resolved stenosis with mass effect on the spinal cord noted on a repeat MRI 03/28/2012. He underwent further decompression surgery on 04/06/2012. The ataxia, weakness, and peripheral numbness is much improved. 16. Right lower  Extremity deep vein thrombosis 02/02/2012-a Doppler ultrasound confirmed a gastrocnemius and peroneal deep vein thrombosis. An IVC filter was placed prior to surgery and he is maintained on the Lovenox. The IVC filter was removed on 05/04/2012,  He will resume Coumadin anticoagulation after a planned colonoscopy next week. Lovenox will be discontinued when the PT/INR is therapeutic. 17. Anorexia following cervical  spine surgery. Persists.  18. Episode of flank pain and hematuria in 03/31/2012-etiology unclear. He  completed a course of antibiotics. The hematuria and pain have resolved.  19. Rectal wall thickening noted on a CT of the pelvis 03/31/2012. He is scheduled for a colonoscopy on 06/08/2012 20. Lung nodule noted on the CT 03/31/2012-we will consider obtaining a followup CT in 3-6 months.    Disposition:  He appears much improved from a neurologic standpoint following the cervical spine surgery. He will followup with Dr. Newell Coral for evaluation of the low back pain.  He remains off of specific therapy for the amyloidosis/plasma cell dyscrasia. He does not appear to have symptoms related to amyloidosis aside from the bruising. We will repeat a CBC and serum light chain analysis when he returns in one month.  Brent Allison will hold Lovenox on the day prior to the colonoscopy. He will resume Lovenox and began Coumadin on the evening following the colonoscopy procedure if no biopsy is performed. He is scheduled for a PT check on 06/12/2012.  He will return for an office visit in one month.   Brent Papas, MD  05/29/2012  5:00 PM

## 2012-05-29 NOTE — Telephone Encounter (Signed)
Appt made and printed for  Pt aom

## 2012-05-31 ENCOUNTER — Telehealth: Payer: Self-pay | Admitting: *Deleted

## 2012-05-31 ENCOUNTER — Other Ambulatory Visit: Payer: Self-pay | Admitting: Neurosurgery

## 2012-05-31 DIAGNOSIS — M545 Low back pain: Secondary | ICD-10-CM

## 2012-05-31 DIAGNOSIS — M5137 Other intervertebral disc degeneration, lumbosacral region: Secondary | ICD-10-CM

## 2012-05-31 NOTE — Telephone Encounter (Signed)
Left message on voicemail for pt: OK to discontinue Acyclovir,. Pt is currently off treatment.

## 2012-06-06 ENCOUNTER — Ambulatory Visit
Admission: RE | Admit: 2012-06-06 | Discharge: 2012-06-06 | Disposition: A | Discharge status: Home / Self Care | Payer: Medicare Other | Source: Ambulatory Visit | Attending: Neurosurgery | Admitting: Neurosurgery

## 2012-06-06 DIAGNOSIS — M5137 Other intervertebral disc degeneration, lumbosacral region: Secondary | ICD-10-CM

## 2012-06-06 DIAGNOSIS — M545 Low back pain: Secondary | ICD-10-CM

## 2012-06-06 MED ORDER — GADOBENATE DIMEGLUMINE 529 MG/ML IV SOLN
17.0000 mL | Freq: Once | INTRAVENOUS | Status: AC | PRN
  Administered 2012-06-06: 17 mL via INTRAVENOUS

## 2012-06-08 ENCOUNTER — Other Ambulatory Visit: Payer: Self-pay | Admitting: Gastroenterology

## 2012-06-14 ENCOUNTER — Telehealth: Payer: Self-pay | Admitting: *Deleted

## 2012-06-14 NOTE — Telephone Encounter (Signed)
Called pt, he had colonoscopy last week and resumed Lovenox after procedure. Pt has not started Coumadin. Plans to resume after next office visit.

## 2012-06-15 ENCOUNTER — Other Ambulatory Visit: Payer: Self-pay | Admitting: Neurosurgery

## 2012-06-19 ENCOUNTER — Other Ambulatory Visit: Payer: Self-pay | Admitting: *Deleted

## 2012-06-19 DIAGNOSIS — E8589 Other amyloidosis: Secondary | ICD-10-CM

## 2012-06-19 MED ORDER — ENOXAPARIN SODIUM 150 MG/ML ~~LOC~~ SOLN: 130.0000 mg | Freq: Every day | SUBCUTANEOUS | Status: DC

## 2012-06-26 ENCOUNTER — Other Ambulatory Visit (HOSPITAL_BASED_OUTPATIENT_CLINIC_OR_DEPARTMENT_OTHER): Payer: Medicare Other

## 2012-06-26 ENCOUNTER — Ambulatory Visit (HOSPITAL_BASED_OUTPATIENT_CLINIC_OR_DEPARTMENT_OTHER): Payer: Medicare Other | Admitting: Nurse Practitioner

## 2012-06-26 ENCOUNTER — Telehealth: Payer: Self-pay | Admitting: Oncology

## 2012-06-26 VITALS — BP 109/60 | HR 65 | Temp 97.0°F | Resp 20 | Ht 73.0 in | Wt 189.9 lb

## 2012-06-26 DIAGNOSIS — M549 Dorsalgia, unspecified: Secondary | ICD-10-CM

## 2012-06-26 DIAGNOSIS — C9000 Multiple myeloma not having achieved remission: Secondary | ICD-10-CM

## 2012-06-26 DIAGNOSIS — I82409 Acute embolism and thrombosis of unspecified deep veins of unspecified lower extremity: Secondary | ICD-10-CM

## 2012-06-26 DIAGNOSIS — R911 Solitary pulmonary nodule: Secondary | ICD-10-CM

## 2012-06-26 DIAGNOSIS — E859 Amyloidosis, unspecified: Secondary | ICD-10-CM

## 2012-06-26 LAB — CBC WITH DIFFERENTIAL/PLATELET
BASO%: 0.7 % (ref 0.0–2.0)
Basophils Absolute: 0 10*3/uL (ref 0.0–0.1)
EOS%: 1.6 % (ref 0.0–7.0)
Eosinophils Absolute: 0.1 10*3/uL (ref 0.0–0.5)
HCT: 37.8 % — ABNORMAL LOW (ref 38.4–49.9)
HGB: 12.8 g/dL — ABNORMAL LOW (ref 13.0–17.1)
LYMPH%: 25.9 % (ref 14.0–49.0)
MCH: 32.3 pg (ref 27.2–33.4)
MCHC: 34 g/dL (ref 32.0–36.0)
MCV: 95 fL (ref 79.3–98.0)
MONO#: 0.6 10*3/uL (ref 0.1–0.9)
MONO%: 11.5 % (ref 0.0–14.0)
NEUT#: 2.9 10*3/uL (ref 1.5–6.5)
NEUT%: 60.3 % (ref 39.0–75.0)
Platelets: 160 10*3/uL (ref 140–400)
RBC: 3.98 10*6/uL — ABNORMAL LOW (ref 4.20–5.82)
RDW: 13.5 % (ref 11.0–14.6)
WBC: 4.8 10*3/uL (ref 4.0–10.3)
lymph#: 1.2 10*3/uL (ref 0.9–3.3)

## 2012-06-26 LAB — PROTIME-INR
INR: 1.1 — ABNORMAL LOW (ref 2.00–3.50)
Protime: 13.2 Seconds (ref 10.6–13.4)

## 2012-06-26 MED ORDER — HYDROCODONE-ACETAMINOPHEN 5-500 MG PO TABS: 1.0000 | ORAL_TABLET | Freq: Four times a day (QID) | ORAL | Status: AC | PRN

## 2012-06-26 NOTE — Telephone Encounter (Signed)
appts  Made and printed for pt ,pt aware  That HIM will call/do [facility] ref      aom

## 2012-06-26 NOTE — Progress Notes (Signed)
OFFICE PROGRESS NOTE  Interval history:  Brent Allison returns as scheduled. He continues to note improvement in upper and lower extremity strength. He has persistent mild numbness in the fingertips. He has a good appetite. Weight is stable. He continues Lovenox injections. He denies bleeding. He continues to have low back pain which is worse with standing and with ambulation. He has seen Dr. Newell Coral and reports being scheduled for surgery on 07/31/2012.   Objective: Blood pressure 109/60, pulse 65, temperature 97 F (36.1 C), temperature source Oral, resp. rate 20, height 6\' 1"  (1.854 m), weight 189 lb 14.4 oz (86.138 kg).  Oropharynx is without thrush or ulceration. Lungs are clear. Regular cardiac rhythm. Abdomen is soft and nontender. No organomegaly. Extremities are without edema. Motor strength 5 over 5. Ecchymosis at the left eyelid.  Lab Results: Lab Results  Component Value Date   WBC 4.8 06/26/2012   HGB 12.8* 06/26/2012   HCT 37.8* 06/26/2012   MCV 95.0 06/26/2012   PLT 160 06/26/2012    Chemistry:    Chemistry      Component Value Date/Time   NA 140 06/26/2012 0916   K 4.2 06/26/2012 0916   CL 105 06/26/2012 0916   CO2 29 06/26/2012 0916   BUN 15 06/26/2012 0916   CREATININE 0.88 06/26/2012 0916      Component Value Date/Time   CALCIUM 10.0 06/26/2012 0916   ALKPHOS 70 03/31/2012 1821   AST 12 03/31/2012 1821   ALT 10 03/31/2012 1821   BILITOT 0.3 03/31/2012 1821       Studies/Results: Ct Lumbar Allison Wo Contrast  06/06/2012  *RADIOLOGY REPORT*  Clinical Data: Bilateral leg pain for a few months.  History of multiple myeloma.  CT LUMBAR Allison WITHOUT CONTRAST  Technique:  Multidetector CT imaging of the lumbar Allison was performed without intravenous contrast administration. Multiplanar CT image reconstructions were also generated.  Comparison: MRI lumbar Allison performed concurrently.  Lumbosacral Allison films performed at Dr. [place] Gala  office.  Findings: There has been previous L4-5  fusion.  Solid posterolateral and interbody fusion.  Pedicle screws show no significant loosening.  Slight downward extension right L5 screw into the foramen.  Alignment is anatomic.  No osseous destructive lesions are seen concerning for myeloma.  Coarse trabecular striations involving the transverse process, pedicle, and dorsal body at L1 on the right are consistent with hemangioma, unchanged from prior CT abdomen. Advanced disc space narrowing L5-S1 with endplate lucencies consistent with subchondral cyst formation, not myeloma.  No prevertebral or paraspinous masses.  Mild atheromatous change of the aorta.  The individual disc spaces are examined as follows:  L1-2:  Normal.  L2-3:  Moderate facet arthropathy and ligamentum flavum hypertrophy. Slight annular bulging eccentric to the left.  Mild central canal stenosis.  Possible left L3 nerve root encroachment  L3-4:  Mild annular bulging eccentric to the left.  Asymmetric facet arthropathy and ligamentum flavum hypertrophy with calcification, also eccentric to the left.  Moderate to severe central canal stenosis. Bilateral L4 nerve root encroachment suspected, left greater than right.  Left neural foraminal narrowing likely affects the L3 nerve root.  L4-5:  Solid fusion.  No residual neural compression.  L5-S1:  Advanced disc space narrowing.  Broad-based central protrusion.  Moderate facet arthropathy.  Slight right S1 nerve root displacement in the canal.  Significant bilateral neural foraminal narrowing due to disc space narrowing and bony overgrowth potentially affects the right greater than left L5 nerve roots. The relationship of the slightly  inferiorly positioned screw to the right L5 nerve root is not likely significant.  IMPRESSION: Solid fusion L4-L5.  Multilevel spondylosis at L2-3, L3-4, and L5-S1 as described. Potentially significant spinal stenosis is noted at L3-4 greater than L2-3.  Clinically significant foraminal narrowing is suspected at L5-S1  bilaterally and L3-4 on the left.  No osseous destructive lesions are seen concerning for myeloma.  Original Report Authenticated By: Elsie Stain, M.D.   Brent Allison W Wo Contrast  06/06/2012  *RADIOLOGY REPORT*  Clinical Data: Low back and bilateral hip and leg pain.  History of surgery 7 years ago.  MRI LUMBAR Allison WITHOUT AND WITH CONTRAST  Technique:  Multiplanar and multiecho pulse sequences of the lumbar Allison were obtained without and with intravenous contrast.  Contrast: 17mL MULTIHANCE GADOBENATE DIMEGLUMINE 529 MG/ML IV SOLN  Comparison: CT abdomen pelvis 03/31/2012.  Findings: Vertebral body height and alignment are maintained.  The patient is status post L4-5 fusion.  No worrisome marrow lesion is identified with some scattered hemangiomas noted.  The patient has a somewhat congenitally narrow central spinal canal from L1-2 to L4- 5.  The conus medullaris is normal in signal and position.  Imaged intra-abdominal contents are unremarkable.  The T11-12 and T12-L1 levels are imaged in the sagittal plane only and negative.  L1-2:  Mild disc bulge without central canal or foraminal narrowing.  L2-3:  The patient has a disc bulge with bulky ligamentum flavum thickening. There is also some facet degenerative change. Moderately severe to severe central canal and bilateral lateral recess stenosis is present.  Neural foramina appear open.  L3-4:  Disc bulge and bulky ligamentum flavum thickening are identified with some facet degenerative disease.  There is severe central canal and lateral recess stenosis.  Foramina appear open.  L4-5:  Status post laminectomy and fusion.  The central spinal canal and neural foramina widely patent.  L5-S1:  Mild disc bulge is identified with some facet degenerative disease ligamentum flavum thickening.  The central canal is widely patent.  Moderate bilateral foraminal narrowing is identified.  IMPRESSION:  1.  Severe central canal and lateral recess stenosis at L3-4 due  to a combination of disc bulging, ligamentum flavum thickening and short pedicle length. 2.  Moderately severe to severe central canal and lateral recess stenosis L2-3 due to disc bulge, ligamentum flavum thickening short pedicles. 3.  Status post L4-5 laminectomy and fusion.  Central canal and neural foramina are widely patent. 4.  Disc bulge and facet arthropathy at L4-5 cause moderate bilateral foraminal narrowing.  Central canal is widely patent.  Original Report Authenticated By: Bernadene Bell. Maricela Curet, M.D.    Medications: I have reviewed the patient's current medications.  Assessment/Plan:  1. 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 2. "Bruising" at the eyelids and mouth: Likely related to amyloidosis, persistent. 3. Numbness and loss of vibratory sense at the fingertips: This predated Velcade-based therapy, but worsened. Now much improved following cervical Allison surgery. 4. Elevated serum free lambda light chains. The lambda light chains were lower on November 16 and slightly higher on 12/14/2011. 5. Lambda light chain proteinuria. 6. Bone marrow plasmacytosis: Variable increase in plasma cells noted on the bone marrow biopsy 07/29/2011 with plasma cells estimated to represent between 4% and 20% of the cellular population. 7. Remote history of prostate cancer. 8. Sleep apnea. 9. Dyslipidemia. 10. Report of pneumonia on 2 occasions in 2011. 11. Admission to a hospital in [place], [place] October 2012 with "pneumonia." A chest x-ray at [facility] on November 2 was negative .  12. Low serum immunoglobulin G level. 13. Borderline-low hemoglobin level, improved. 14. Plasma cell dyscrasia with associated amyloidosis.  a. Initiation of systemic therapy with Cytoxan, Velcade, and Decadron 08/12/2011. Cycle #2 was initiated on 09/16/2011. Cycle #3 was initiated on 10/15/2011. Velcade was placed on hold due to neuropathy. b. The serum free lambda light chains were decreased on  October 08 2011. c. The serum free lambda light chains were slightly increased on 12/14/2011, lower on 12/29/2011 and 02/02/2012. d. Initiation of Revlimid/Decadron February 2013, cycle 2 started on 01/28/2012. 15. Loss of "balance "and proximal motor weakness secondary to cervical stenosis-status post decompression surgery on 02/19/2012. Improved, but not resolved stenosis with mass effect on the spinal cord noted on a repeat MRI 03/28/2012. He underwent further decompression surgery on 04/06/2012. The ataxia, weakness, and peripheral numbness is much improved.  16. Right lower Extremity deep vein thrombosis 02/02/2012-a Doppler ultrasound confirmed a gastrocnemius and peroneal deep vein thrombosis. An IVC filter was placed prior to surgery and he was maintained on Lovenox. The IVC filter was removed on 05/04/2012. He continues Lovenox. 17. Anorexia following cervical Allison surgery. Improved. 18. Episode of flank pain and hematuria in 03/31/2012-etiology unclear. He completed a course of antibiotics. The hematuria and pain have resolved.  19. Rectal wall thickening noted on a CT of the pelvis 03/31/2012.  20. Recent colonoscopy showed an area of abnormality at the rectum with biopsy positive for amyloid. 21. Lung nodule noted on the CT 03/31/2012-we will consider obtaining a followup CT in 3-6 months.   Disposition-Brent Allison appears stable. We will followup on the serum light chains from today. A recent rectal biopsy showed amyloid. Dr. Truett Perna recommends a referral to Dr. Marissa Calamity at [facility] for recommendations on treatment options. Brent. Neal will return for a follow-up visit with Dr. Truett Perna on 07/20/2012. Due to the upcoming back surgery we will not transition anticoagulation to Coumadin. He will continue Lovenox. He will contact the office prior to his next visit with any problems.  Patient seen with Dr. Truett Perna.       Lonna Cobb ANP/GNP-BC

## 2012-06-27 LAB — BASIC METABOLIC PANEL
BUN: 15 mg/dL (ref 6–23)
CO2: 29 mEq/L (ref 19–32)
Calcium: 10 mg/dL (ref 8.4–10.5)
Chloride: 105 mEq/L (ref 96–112)
Creatinine, Ser: 0.88 mg/dL (ref 0.50–1.35)
Glucose, Bld: 93 mg/dL (ref 70–99)
Potassium: 4.2 mEq/L (ref 3.5–5.3)
Sodium: 140 mEq/L (ref 135–145)

## 2012-06-27 LAB — KAPPA/LAMBDA LIGHT CHAINS
Kappa free light chain: 0.06 mg/dL — ABNORMAL LOW (ref 0.33–1.94)
Kappa:Lambda Ratio: 0 — ABNORMAL LOW (ref 0.26–1.65)
Lambda Free Lght Chn: 16.1 mg/dL — ABNORMAL HIGH (ref 0.57–2.63)

## 2012-06-29 ENCOUNTER — Telehealth: Payer: Self-pay | Admitting: *Deleted

## 2012-06-29 NOTE — Telephone Encounter (Signed)
Pt returned call, lab results given. He voiced understanding. Asking if referral has been made to Dr. Marissa Calamity. Will follow up with Selena Batten in HIM.

## 2012-06-29 NOTE — Telephone Encounter (Signed)
Message copied by Caleb Popp on Thu Jun 29, 2012 12:22 PM ------      Message from: Ladene Artist      Created: Tue Jun 27, 2012 10:54 PM       Please call patient, light chains are unchanged, f/u as scheduled

## 2012-06-29 NOTE — Telephone Encounter (Signed)
Left message for pt to call office regarding labs.

## 2012-06-30 ENCOUNTER — Telehealth: Payer: Self-pay | Admitting: Oncology

## 2012-06-30 NOTE — Telephone Encounter (Signed)
Spoke with [name] at Dr. Marissa Calamity office. She stated she would give Brent Allison the information on NP. Awaiting call from [name].

## 2012-07-12 ENCOUNTER — Telehealth: Payer: Self-pay | Admitting: *Deleted

## 2012-07-12 NOTE — Telephone Encounter (Signed)
Received call from pt asking about referral appt to [facility] and stated he is "traveling all this week and next week, except next Thursday, Friday" and he can be reached on his cell.  Spoke with Selena Batten in HIM stating she was waiting on a return call from [place] and would notify pt with further instructions.

## 2012-07-13 ENCOUNTER — Telehealth: Payer: Self-pay | Admitting: Oncology

## 2012-07-13 NOTE — Telephone Encounter (Signed)
Pt has appt. With Dr. Marissa Calamity on 07/26/12 @ 8:30. Medical records faxed. Pt is aware.

## 2012-07-18 ENCOUNTER — Encounter (HOSPITAL_COMMUNITY): Payer: Self-pay | Admitting: Respiratory Therapy

## 2012-07-20 ENCOUNTER — Ambulatory Visit (HOSPITAL_BASED_OUTPATIENT_CLINIC_OR_DEPARTMENT_OTHER): Payer: Medicare Other | Admitting: Oncology

## 2012-07-20 ENCOUNTER — Telehealth: Payer: Self-pay | Admitting: Oncology

## 2012-07-20 ENCOUNTER — Encounter (HOSPITAL_COMMUNITY)
Admission: RE | Admit: 2012-07-20 | Discharge: 2012-07-20 | Disposition: A | Discharge status: Home / Self Care | Payer: Medicare Other | Source: Ambulatory Visit | Attending: Neurosurgery | Admitting: Neurosurgery

## 2012-07-20 ENCOUNTER — Encounter (HOSPITAL_COMMUNITY): Payer: Self-pay

## 2012-07-20 ENCOUNTER — Other Ambulatory Visit (HOSPITAL_BASED_OUTPATIENT_CLINIC_OR_DEPARTMENT_OTHER): Payer: Medicare Other

## 2012-07-20 VITALS — BP 114/59 | HR 82 | Temp 98.5°F | Resp 20 | Ht 73.0 in | Wt 194.8 lb

## 2012-07-20 DIAGNOSIS — I82409 Acute embolism and thrombosis of unspecified deep veins of unspecified lower extremity: Secondary | ICD-10-CM

## 2012-07-20 DIAGNOSIS — C9000 Multiple myeloma not having achieved remission: Secondary | ICD-10-CM

## 2012-07-20 DIAGNOSIS — E859 Amyloidosis, unspecified: Secondary | ICD-10-CM

## 2012-07-20 DIAGNOSIS — R911 Solitary pulmonary nodule: Secondary | ICD-10-CM

## 2012-07-20 DIAGNOSIS — Z8546 Personal history of malignant neoplasm of prostate: Secondary | ICD-10-CM

## 2012-07-20 HISTORY — DX: Peripheral vascular disease, unspecified: I73.9

## 2012-07-20 LAB — CBC WITH DIFFERENTIAL/PLATELET
BASO%: 0.7 % (ref 0.0–2.0)
Basophils Absolute: 0 10*3/uL (ref 0.0–0.1)
EOS%: 1.9 % (ref 0.0–7.0)
Eosinophils Absolute: 0.1 10*3/uL (ref 0.0–0.5)
HCT: 38.3 % — ABNORMAL LOW (ref 38.4–49.9)
HGB: 13.1 g/dL (ref 13.0–17.1)
LYMPH%: 27.1 % (ref 14.0–49.0)
MCH: 31.6 pg (ref 27.2–33.4)
MCHC: 34.2 g/dL (ref 32.0–36.0)
MCV: 92.3 fL (ref 79.3–98.0)
MONO#: 0.8 10*3/uL (ref 0.1–0.9)
MONO%: 13.9 % (ref 0.0–14.0)
NEUT#: 3.3 10*3/uL (ref 1.5–6.5)
NEUT%: 56.4 % (ref 39.0–75.0)
Platelets: 175 10*3/uL (ref 140–400)
RBC: 4.15 10*6/uL — ABNORMAL LOW (ref 4.20–5.82)
RDW: 13.6 % (ref 11.0–14.6)
WBC: 5.9 10*3/uL (ref 4.0–10.3)
lymph#: 1.6 10*3/uL (ref 0.9–3.3)
nRBC: 0 % (ref 0–0)

## 2012-07-20 LAB — BASIC METABOLIC PANEL
BUN: 12 mg/dL (ref 6–23)
CO2: 29 mEq/L (ref 19–32)
Calcium: 10.3 mg/dL (ref 8.4–10.5)
Chloride: 105 mEq/L (ref 96–112)
Creatinine, Ser: 0.89 mg/dL (ref 0.50–1.35)
GFR calc Af Amer: >90 mL/min (ref >90)
GFR calc non Af Amer: 87 mL/min — ABNORMAL LOW (ref >90)
Glucose, Bld: 98 mg/dL (ref 70–99)
Potassium: 4.2 mEq/L (ref 3.5–5.1)
Sodium: 141 mEq/L (ref 135–145)

## 2012-07-20 LAB — PROTIME-INR
INR: 1.1 — ABNORMAL LOW (ref 2.00–3.50)
Protime: 13.2 Seconds (ref 10.6–13.4)

## 2012-07-20 LAB — TYPE AND SCREEN
ABO/RH(D): A POS
Antibody Screen: NEGATIVE

## 2012-07-20 LAB — SURGICAL PCR SCREEN
MRSA, PCR: NEGATIVE
Staphylococcus aureus: POSITIVE — AB

## 2012-07-20 NOTE — Progress Notes (Signed)
Pt. Seen at [facility] today, had fingerstick CBC & PT/INR- results available & in EPIC.  Pt. Reports sleep study done at [facility]., will call for result. Pt. Also reports cardiac activities (studies) through [facility] called for records, will need to try later, on ans. Serv.

## 2012-07-20 NOTE — Patient Instructions (Signed)
Brent Allison 1946/06/22 [id number]  [facility] Discharge Instructions  Your exam findings, labs and results were discussed with your MD today.  Filed Vitals:   07/20/12 1000  BP: 114/59  Pulse: 82  Temp: 98.5 F (36.9 C)  Resp: 20   Current outpatient prescriptions:aspirin EC 81 MG tablet, Take 81 mg by mouth daily., Disp: , Rfl: ;  enoxaparin (LOVENOX) 150 MG/ML injection, Inject 135 mg into the skin daily., Disp: , Rfl:   Please visit scheduling to obtain calendar for future appointments.  Please call the [facility] at [phone] during business hours should you have any further questions or need assistance in obtaining follow-up care. If you have a medical emergency, please dial 911.  Special Instructions:

## 2012-07-20 NOTE — Telephone Encounter (Signed)
gve the pt his sept 2013 appt calendar °

## 2012-07-20 NOTE — Progress Notes (Signed)
Call to [facility].- requested sleep study. - done 10/2009- spoke /w Tammy

## 2012-07-20 NOTE — Progress Notes (Signed)
[facility]    OFFICE PROGRESS NOTE   INTERVAL HISTORY:   He returns as scheduled. He is scheduled for surgery on 07/31/2012. He will see Dr. Marissa Calamity on 07/26/2012.  Mr. Sansom continues to have back pain when standing. No other complaint. He reports good arm and leg strength. No difficulty with bowel or bladder function. No change in the right leg.  Objective:  Vital signs in last 24 hours:  Blood pressure 114/59, pulse 82, temperature 98.5 F (36.9 C), temperature source Oral, resp. rate 20, height 6\' 1"  (1.854 m), weight 194 lb 12.8 oz (88.361 kg).    HEENT: Mild periorbital bruising, no bruises in the mouth Resp: Lungs clear bilaterally Cardio: Regular rate and rhythm GI: No hepatosplenomegaly Vascular: The right lower leg is slightly larger than the left side, no erythema Neuro: The motor exam appears intact in the upper and lower extremities    Lab Results:  Lab Results  Component Value Date   WBC 5.9 07/20/2012   HGB 13.1 07/20/2012   HCT 38.3* 07/20/2012   MCV 92.3 07/20/2012   PLT 175 07/20/2012   ANC 3.3  Serum free lambda light chains 16.1 on 06/26/2012  Medications: I have reviewed the patient's current medications.  Assessment/Plan: 1. 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 2. "Bruising" at the eyelids and mouth: Likely related to amyloidosis, persistent. 3. Numbness and loss of vibratory sense at the fingertips: This predated Velcade-based therapy, but worsened. Now much improved following cervical spine surgery. 4. Elevated serum free lambda light chains. The lambda light chains were lower on November 16 and slightly higher on 12/14/2011. 5. Lambda light chain proteinuria. 6. Bone marrow plasmacytosis: Variable increase in plasma cells noted on the bone marrow biopsy 07/29/2011 with plasma cells estimated to represent between 4% and 20% of the cellular population. 7. Remote history of prostate cancer. 8. Sleep  apnea. 9. Dyslipidemia. 10. Report of pneumonia on 2 occasions in 2011. 11. Admission to a hospital in [place], [place] October 2012 with "pneumonia." A chest x-ray at [facility] on November 2 was negative .  12. Low serum immunoglobulin G level. 13. Borderline-low hemoglobin level, improved. 14. Plasma cell dyscrasia with associated amyloidosis.  a. Initiation of systemic therapy with Cytoxan, Velcade, and Decadron 08/12/2011. Cycle #2 was initiated on 09/16/2011. Cycle #3 was initiated on 10/15/2011. Velcade was placed on hold due to neuropathy. b. The serum free lambda light chains were decreased on October 08 2011. c. The serum free lambda light chains were slightly increased on 12/14/2011, lower on 12/29/2011 and 02/02/2012. d. Initiation of Revlimid/Decadron February 2013, cycle 2 started on 01/28/2012. 15. Loss of "balance "and proximal motor weakness secondary to cervical stenosis-status post decompression surgery on 02/19/2012. Improved, but not resolved stenosis with mass effect on the spinal cord noted on a repeat MRI 03/28/2012. He underwent further decompression surgery on 04/06/2012. The ataxia, weakness, and peripheral numbness is much improved.  16. Right lower Extremity deep vein thrombosis 02/02/2012-a Doppler ultrasound confirmed a gastrocnemius and peroneal deep vein thrombosis. An IVC filter was placed prior to surgery and he was maintained on Lovenox. The IVC filter was removed on 05/04/2012. He continues Lovenox.  17. Anorexia following cervical spine surgery. Improved.  18. Episode of flank pain and hematuria in 03/31/2012-etiology unclear. He completed a course of antibiotics. The hematuria and pain have resolved.  19. Rectal wall thickening noted on a CT of the pelvis 03/31/2012.  20. Recent colonoscopy showed an area of abnormality at  the rectum with biopsy positive for amyloid.  21. Lung nodule noted on the CT 03/31/2012-we will consider obtaining a followup CT in  3-6 months.   Disposition:  He appears stable. He is scheduled to see Dr. Marissa Calamity on 07/26/2012 for recommendations regarding management of the amyloidosis/plasma cell dyscrasia. He remains off of specific therapy at present.  He is scheduled undergo lumbar spine surgery on 07/31/2012. He was diagnosed with a right lower extremity DVT in March of 2013. He was being treated with Revlimid/Decadron at the time. He has completed almost 6 months of anticoagulation therapy. I do not recommend placement of an IVC filter with the upcoming surgery. I will recommend perioperative DVT prophylaxis with compression stockings, ambulation, and Lovenox. We will consider one to 2 months of Coumadin if he is not ambulatory.  I will plan to check on him in the hospital following surgery. He will return for an office visit here on 08/11/2012.   [name], MD  07/20/2012  4:40 PM

## 2012-07-20 NOTE — Pre-Procedure Instructions (Signed)
20 OCIE TINO  07/20/2012   Your procedure is scheduled on:  07/31/2012, MONDAY  Report to [facility] at 5:30 AM.  Call this number if you have problems the morning of surgery: [phone]   Remember:   Do not eat food or drink liquidAfter Midnight.    .  Take these medicines the morning of surgery with A SIP OF WATER: Nothing   Do not wear jewelry, make-up or nail polish.  Do not wear lotions, powders, or perfumes. You may wear deodorant.  Do not shave 48 hours prior to surgery. Men may shave face and neck.  Do not bring valuables to the hospital.  Contacts, dentures or bridgework may not be worn into surgery.  Leave suitcase in the car. After surgery it may be brought to your room.  For patients admitted to the hospital, checkout time is 11:00 AM the day of discharge.   Patients discharged the day of surgery will not be allowed to drive home.  Name and phone number of your driver: /w family  Special Instructions: CHG Shower Use Special Wash: 1/2 bottle night before surgery and 1/2 bottle morning of surgery.   Please read over the following fact sheets that you were given: Pain Booklet, Coughing and Deep Breathing, Blood Transfusion Information, MRSA Information and Surgical Site Infection Prevention

## 2012-07-20 NOTE — Progress Notes (Signed)
Call to [facility] Cardiac grp. Left  Voice msg. For Med. Records to sent last OV note & cardiac studies.

## 2012-07-21 ENCOUNTER — Other Ambulatory Visit: Payer: Self-pay | Admitting: *Deleted

## 2012-07-21 ENCOUNTER — Other Ambulatory Visit: Payer: Self-pay | Admitting: Oncology

## 2012-07-21 ENCOUNTER — Telehealth: Payer: Self-pay | Admitting: *Deleted

## 2012-07-21 ENCOUNTER — Encounter (HOSPITAL_COMMUNITY): Payer: Self-pay | Admitting: Vascular Surgery

## 2012-07-21 LAB — ABO/RH: ABO/RH(D): A POS

## 2012-07-21 NOTE — Telephone Encounter (Signed)
Received call from pt requesting Lovenox re-fill "going out of town and need to have before leaving"  Per Dr. Truett Perna, prescription re-filled.  Called and spoke with pt informing him re-fill has been sent. Pt verbalized understanding and expressed appreciation for call.

## 2012-07-21 NOTE — Telephone Encounter (Signed)
Received message from [facility] req. copies of radiology reports for last 90 days to be faxed to [phone] (this was done) & CD made & mailed to [address] [place], [place] [address].  [employer]ex # is [phone].  Notified Deidra/Radiology to get CD made & mailed.

## 2012-07-21 NOTE — Telephone Encounter (Signed)
Received call from pt requesting re-fill of Lovenox "I'm going out of town and need re-fill before I leave"  Per Dr. Truett Perna, ok to re-fill.  Called and spoke with pt informing him re-fill has been sent.  Pt verbalized understanding and expressed appreciation for call.

## 2012-07-21 NOTE — Consult Note (Signed)
Anesthesia Chart Review:  Patient is a 66 year old male scheduled for L2-4 laminectomy, L2-3, L3-4 posteriolateral arthrodesis and posterior segmental instrumentation by Dr. Newell Coral on 07/31/2012.  He is s/p C3-5 laminectomy and C2-5 posterior cervical arthrodesis on 04/06/12 and C3-4, C4-5 anterior cervical decompression and arthrodesis 02/26/12.  Other history includes former smoker, multiple myeloma, DVT RLE 02/02/12 (history of IVC filter s/p removal 05/04/12; and remains on SQ Lovenox), prostate cancer s/p prostatectomy, peripheral vascular disease, obstructive sleep apnea, dyslipidemia, arthritis, cervical spondylosis and DDD with resulting myelopathy with quadriparesis, PNA '11, eyelid '12 and rectal '13 biopsies positive for amyloid.  His Hematologist is Dr. Truett Perna who is aware of upcoming surgery and has instructed him to hold his Lovenox starting 07/29/12.  He also plans to refer patient to Dr. Marissa Calamity at [facility] for continued treatment options for amyloidosis.  PCP is Dr. Catha Gosselin.  He does not see a Cardiologist on a regular basis, but was evaluated by Dr. Eldridge Dace [facility]) for chest pain in late 2010.  He underwent cardiac cath on 11/17/09 that showed no significant coronary artery disease, normal ventricular function, EF 55%, no abdominal aortic aneurysm.  [facility] Cardiology follow-up was recommended.  EKG on 02/26/12 showed NSR.  CXR on 09/24/11 showed no acute cardiopulmonary process.  Labs from 07/20/12 reviewed.  Will order a PT/PTT for the day of surgery.  A T&S was done.  (His PAT nurse indicates the patient did not want to continue to wear his blue blood bracelet, so may have to repeat that as well if he removes it prior to surgery.)  If follow-up labs are reasonable, then anticipate he can proceed as planned.  Shonna Chock, PA-C

## 2012-07-21 NOTE — Progress Notes (Signed)
Left message for [facility] Cardiology medical records-request cardiac studies, most recent office note and EKG

## 2012-07-30 MED ORDER — CEFAZOLIN SODIUM-DEXTROSE 2-3 GM-% IV SOLR
2.0000 g | INTRAVENOUS | Status: AC
  Administered 2012-07-31: 2 g via INTRAVENOUS
  Filled 2012-07-30 (×2): qty 50

## 2012-07-31 ENCOUNTER — Inpatient Hospital Stay (HOSPITAL_COMMUNITY): Payer: Medicare Other

## 2012-07-31 ENCOUNTER — Encounter (HOSPITAL_COMMUNITY): Payer: Self-pay | Admitting: Vascular Surgery

## 2012-07-31 ENCOUNTER — Inpatient Hospital Stay (HOSPITAL_COMMUNITY): Payer: Medicare Other | Admitting: Vascular Surgery

## 2012-07-31 ENCOUNTER — Inpatient Hospital Stay (HOSPITAL_COMMUNITY)
Admission: RE | Admit: 2012-07-31 | Discharge: 2012-08-02 | DRG: 460 | Disposition: A | Discharge status: Home / Self Care | Payer: Medicare Other | Source: Ambulatory Visit | Attending: Neurosurgery | Admitting: Neurosurgery

## 2012-07-31 ENCOUNTER — Encounter (HOSPITAL_COMMUNITY)
Admission: RE | Disposition: A | Discharge status: Home / Self Care | Payer: Self-pay | Source: Ambulatory Visit | Attending: Neurosurgery

## 2012-07-31 DIAGNOSIS — Z01812 Encounter for preprocedural laboratory examination: Secondary | ICD-10-CM

## 2012-07-31 DIAGNOSIS — M47817 Spondylosis without myelopathy or radiculopathy, lumbosacral region: Principal | ICD-10-CM | POA: Diagnosis present

## 2012-07-31 DIAGNOSIS — E785 Hyperlipidemia, unspecified: Secondary | ICD-10-CM | POA: Diagnosis present

## 2012-07-31 DIAGNOSIS — Z86718 Personal history of other venous thrombosis and embolism: Secondary | ICD-10-CM

## 2012-07-31 DIAGNOSIS — M51379 Other intervertebral disc degeneration, lumbosacral region without mention of lumbar back pain or lower extremity pain: Secondary | ICD-10-CM | POA: Diagnosis present

## 2012-07-31 DIAGNOSIS — C9000 Multiple myeloma not having achieved remission: Secondary | ICD-10-CM | POA: Diagnosis present

## 2012-07-31 DIAGNOSIS — G4730 Sleep apnea, unspecified: Secondary | ICD-10-CM | POA: Diagnosis present

## 2012-07-31 DIAGNOSIS — Z8546 Personal history of malignant neoplasm of prostate: Secondary | ICD-10-CM

## 2012-07-31 DIAGNOSIS — Z981 Arthrodesis status: Secondary | ICD-10-CM

## 2012-07-31 DIAGNOSIS — M5137 Other intervertebral disc degeneration, lumbosacral region: Secondary | ICD-10-CM | POA: Diagnosis present

## 2012-07-31 DIAGNOSIS — Z7982 Long term (current) use of aspirin: Secondary | ICD-10-CM

## 2012-07-31 LAB — PROTIME-INR
INR: 1.21 (ref 0.00–1.49)
Prothrombin Time: 15.6 seconds — ABNORMAL HIGH (ref 11.6–15.2)

## 2012-07-31 LAB — APTT: aPTT: 30 seconds (ref 24–37)

## 2012-07-31 SURGERY — POSTERIOR LUMBAR FUSION 2 LEVEL
Anesthesia: General | Site: Back | Wound class: Clean

## 2012-07-31 MED ORDER — BUPIVACAINE HCL (PF) 0.5 % IJ SOLN
INTRAMUSCULAR | Status: DC | PRN
  Administered 2012-07-31: 20 mL

## 2012-07-31 MED ORDER — MIDAZOLAM HCL 5 MG/5ML IJ SOLN
INTRAMUSCULAR | Status: DC | PRN
  Administered 2012-07-31: 2 mg via INTRAVENOUS

## 2012-07-31 MED ORDER — KETOROLAC TROMETHAMINE 30 MG/ML IJ SOLN
INTRAMUSCULAR | Status: AC
  Filled 2012-07-31: qty 1

## 2012-07-31 MED ORDER — VECURONIUM BROMIDE 10 MG IV SOLR
INTRAVENOUS | Status: DC | PRN
  Administered 2012-07-31: 1 mg via INTRAVENOUS
  Administered 2012-07-31: 2 mg via INTRAVENOUS
  Administered 2012-07-31: 1 mg via INTRAVENOUS
  Administered 2012-07-31: 4 mg via INTRAVENOUS
  Administered 2012-07-31: 1 mg via INTRAVENOUS

## 2012-07-31 MED ORDER — ACETAMINOPHEN 325 MG PO TABS: 650.0000 mg | ORAL_TABLET | ORAL | Status: DC | PRN

## 2012-07-31 MED ORDER — PROPOFOL 10 MG/ML IV EMUL
5.0000 ug/kg/min | INTRAVENOUS | Status: DC
  Filled 2012-07-31: qty 100

## 2012-07-31 MED ORDER — KETOROLAC TROMETHAMINE 30 MG/ML IJ SOLN
30.0000 mg | Freq: Four times a day (QID) | INTRAMUSCULAR | Status: DC
  Administered 2012-07-31 – 2012-08-02 (×6): 30 mg via INTRAVENOUS
  Filled 2012-07-31 (×10): qty 1

## 2012-07-31 MED ORDER — NEOSTIGMINE METHYLSULFATE 1 MG/ML IJ SOLN
INTRAMUSCULAR | Status: DC | PRN
  Administered 2012-07-31: 4 mg via INTRAVENOUS

## 2012-07-31 MED ORDER — ROCURONIUM BROMIDE 100 MG/10ML IV SOLN
INTRAVENOUS | Status: DC | PRN
  Administered 2012-07-31: 50 mg via INTRAVENOUS

## 2012-07-31 MED ORDER — FENTANYL CITRATE 0.05 MG/ML IJ SOLN
INTRAMUSCULAR | Status: DC | PRN
  Administered 2012-07-31 (×2): 50 ug via INTRAVENOUS
  Administered 2012-07-31: 150 ug via INTRAVENOUS

## 2012-07-31 MED ORDER — OXYCODONE HCL 5 MG PO TABS: 5.0000 mg | ORAL_TABLET | Freq: Once | ORAL | Status: DC | PRN

## 2012-07-31 MED ORDER — PHENYLEPHRINE HCL 10 MG/ML IJ SOLN
INTRAMUSCULAR | Status: DC | PRN
  Administered 2012-07-31 (×2): 80 ug via INTRAVENOUS

## 2012-07-31 MED ORDER — ACETAMINOPHEN 650 MG RE SUPP: 650.0000 mg | RECTAL | Status: DC | PRN

## 2012-07-31 MED ORDER — PHENYLEPHRINE HCL 10 MG/ML IJ SOLN
10.0000 mg | INTRAVENOUS | Status: DC | PRN
  Administered 2012-07-31: 10 ug/min via INTRAVENOUS

## 2012-07-31 MED ORDER — CYCLOBENZAPRINE HCL 10 MG PO TABS: 10.0000 mg | ORAL_TABLET | Freq: Three times a day (TID) | ORAL | Status: DC | PRN

## 2012-07-31 MED ORDER — OXYCODONE HCL 5 MG/5ML PO SOLN: 5.0000 mg | Freq: Once | ORAL | Status: DC | PRN

## 2012-07-31 MED ORDER — CEFAZOLIN SODIUM 1-5 GM-% IV SOLN
INTRAVENOUS | Status: AC
  Administered 2012-07-31: 1 g via INTRAVENOUS
  Filled 2012-07-31: qty 50

## 2012-07-31 MED ORDER — LACTATED RINGERS IV SOLN
INTRAVENOUS | Status: DC | PRN
  Administered 2012-07-31 (×3): via INTRAVENOUS

## 2012-07-31 MED ORDER — GLYCOPYRROLATE 0.2 MG/ML IJ SOLN
INTRAMUSCULAR | Status: DC | PRN
  Administered 2012-07-31: 0.6 mg via INTRAVENOUS

## 2012-07-31 MED ORDER — PROPOFOL INFUSION 10 MG/ML OPTIME
INTRAVENOUS | Status: DC | PRN
  Administered 2012-07-31: 75 ug/kg/min
  Administered 2012-07-31: 75 ug/kg/min via INTRAVENOUS

## 2012-07-31 MED ORDER — LIDOCAINE HCL (CARDIAC) 20 MG/ML IV SOLN
INTRAVENOUS | Status: DC | PRN
  Administered 2012-07-31: 30 mg via INTRAVENOUS

## 2012-07-31 MED ORDER — PHENOL 1.4 % MT LIQD: 1.0000 | OROMUCOSAL | Status: DC | PRN

## 2012-07-31 MED ORDER — HYDROMORPHONE HCL PF 1 MG/ML IJ SOLN
INTRAMUSCULAR | Status: AC
  Administered 2012-07-31: 0.5 mg via INTRAVENOUS
  Filled 2012-07-31: qty 1

## 2012-07-31 MED ORDER — LIDOCAINE-EPINEPHRINE 1 %-1:100000 IJ SOLN
INTRAMUSCULAR | Status: DC | PRN
  Administered 2012-07-31: 20 mL

## 2012-07-31 MED ORDER — THROMBIN 20000 UNITS EX KIT
PACK | CUTANEOUS | Status: DC | PRN
  Administered 2012-07-31 (×2): 20000 [IU] via TOPICAL

## 2012-07-31 MED ORDER — ALUM & MAG HYDROXIDE-SIMETH 200-200-20 MG/5ML PO SUSP: 30.0000 mL | Freq: Four times a day (QID) | ORAL | Status: DC | PRN

## 2012-07-31 MED ORDER — BISACODYL 10 MG RE SUPP: 10.0000 mg | Freq: Every day | RECTAL | Status: DC | PRN

## 2012-07-31 MED ORDER — HYDROMORPHONE HCL PF 1 MG/ML IJ SOLN
INTRAMUSCULAR | Status: AC
  Filled 2012-07-31: qty 1

## 2012-07-31 MED ORDER — HYDROCODONE-ACETAMINOPHEN 5-325 MG PO TABS: 1.0000 | ORAL_TABLET | ORAL | Status: DC | PRN

## 2012-07-31 MED ORDER — KETOROLAC TROMETHAMINE 30 MG/ML IJ SOLN
30.0000 mg | Freq: Once | INTRAMUSCULAR | Status: AC
  Administered 2012-07-31: 30 mg via INTRAVENOUS

## 2012-07-31 MED ORDER — MAGNESIUM HYDROXIDE 400 MG/5ML PO SUSP: 30.0000 mL | Freq: Every day | ORAL | Status: DC | PRN

## 2012-07-31 MED ORDER — SODIUM CHLORIDE 0.9 % IV SOLN: 250.0000 mL | INTRAVENOUS | Status: DC

## 2012-07-31 MED ORDER — SODIUM CHLORIDE 0.9 % IJ SOLN: 3.0000 mL | INTRAMUSCULAR | Status: DC | PRN

## 2012-07-31 MED ORDER — ZOLPIDEM TARTRATE 5 MG PO TABS: 5.0000 mg | ORAL_TABLET | Freq: Every evening | ORAL | Status: DC | PRN

## 2012-07-31 MED ORDER — HYDROXYZINE HCL 50 MG/ML IM SOLN: 50.0000 mg | INTRAMUSCULAR | Status: DC | PRN

## 2012-07-31 MED ORDER — HYDROXYZINE HCL 25 MG PO TABS: 50.0000 mg | ORAL_TABLET | ORAL | Status: DC | PRN

## 2012-07-31 MED ORDER — SODIUM CHLORIDE 0.9 % IJ SOLN
3.0000 mL | Freq: Two times a day (BID) | INTRAMUSCULAR | Status: DC
  Administered 2012-08-01 (×2): 3 mL via INTRAVENOUS

## 2012-07-31 MED ORDER — DEXAMETHASONE SODIUM PHOSPHATE 4 MG/ML IJ SOLN
INTRAMUSCULAR | Status: DC | PRN
  Administered 2012-07-31 (×2): 4 mg via INTRAVENOUS

## 2012-07-31 MED ORDER — BACITRACIN 50000 UNITS IM SOLR
INTRAMUSCULAR | Status: AC
  Filled 2012-07-31: qty 1

## 2012-07-31 MED ORDER — 0.9 % SODIUM CHLORIDE (POUR BTL) OPTIME
TOPICAL | Status: DC | PRN
  Administered 2012-07-31 (×2): 1000 mL

## 2012-07-31 MED ORDER — KCL IN DEXTROSE-NACL 20-5-0.45 MEQ/L-%-% IV SOLN
INTRAVENOUS | Status: DC
  Administered 2012-07-31: via INTRAVENOUS
  Filled 2012-07-31 (×7): qty 1000

## 2012-07-31 MED ORDER — MORPHINE SULFATE 4 MG/ML IJ SOLN: 4.0000 mg | INTRAMUSCULAR | Status: DC | PRN

## 2012-07-31 MED ORDER — OXYCODONE-ACETAMINOPHEN 5-325 MG PO TABS
1.0000 | ORAL_TABLET | ORAL | Status: DC | PRN
  Administered 2012-08-01: 2 via ORAL
  Filled 2012-07-31: qty 2

## 2012-07-31 MED ORDER — HEMOSTATIC AGENTS (NO CHARGE) OPTIME
TOPICAL | Status: DC | PRN
  Administered 2012-07-31 (×2): 1 via TOPICAL

## 2012-07-31 MED ORDER — SODIUM CHLORIDE 0.9 % IV SOLN
INTRAVENOUS | Status: AC
  Filled 2012-07-31: qty 500

## 2012-07-31 MED ORDER — SODIUM CHLORIDE 0.9 % IR SOLN
Status: DC | PRN
  Administered 2012-07-31: 08:00:00

## 2012-07-31 MED ORDER — PROPOFOL 10 MG/ML IV EMUL
INTRAVENOUS | Status: DC | PRN
  Administered 2012-07-31: 130 mg via INTRAVENOUS

## 2012-07-31 MED ORDER — HYDROMORPHONE HCL PF 1 MG/ML IJ SOLN
0.2500 mg | INTRAMUSCULAR | Status: DC | PRN
  Administered 2012-07-31 (×3): 0.5 mg via INTRAVENOUS

## 2012-07-31 MED ORDER — MENTHOL 3 MG MT LOZG: 1.0000 | LOZENGE | OROMUCOSAL | Status: DC | PRN

## 2012-07-31 MED ORDER — ONDANSETRON HCL 4 MG/2ML IJ SOLN
INTRAMUSCULAR | Status: DC | PRN
  Administered 2012-07-31 (×2): 4 mg via INTRAVENOUS

## 2012-07-31 SURGICAL SUPPLY — 84 items
ADH SKN CLS APL DERMABOND .7 (GAUZE/BANDAGES/DRESSINGS) ×1
APL SKNCLS STERI-STRIP NONHPOA (GAUZE/BANDAGES/DRESSINGS)
BAG DECANTER FOR FLEXI CONT (MISCELLANEOUS) ×2 IMPLANT
BENZOIN TINCTURE PRP APPL 2/3 (GAUZE/BANDAGES/DRESSINGS) IMPLANT
BLADE SURG ROTATE 9660 (MISCELLANEOUS) IMPLANT
BRUSH SCRUB EZ PLAIN DRY (MISCELLANEOUS) ×2 IMPLANT
BUR ACORN 6.0 ACORN (BURR) IMPLANT
BUR ACRON 5.0MM COATED (BURR) ×2 IMPLANT
BUR MATCHSTICK NEURO 3.0 LAGG (BURR) ×2 IMPLANT
CANISTER SUCTION 2500CC (MISCELLANEOUS) ×2 IMPLANT
CLOTH BEACON ORANGE TIMEOUT ST (SAFETY) ×2 IMPLANT
CONT SPEC 4OZ CLIKSEAL STRL BL (MISCELLANEOUS) ×3 IMPLANT
COVER BACK TABLE 24X17X13 BIG (DRAPES) IMPLANT
COVER TABLE BACK 60X90 (DRAPES) ×2 IMPLANT
CROSSLINK DANEK (Orthopedic Implant) ×2 IMPLANT
DERMABOND ADVANCED (GAUZE/BANDAGES/DRESSINGS) ×1
DERMABOND ADVANCED .7 DNX12 (GAUZE/BANDAGES/DRESSINGS) ×1 IMPLANT
DRAPE C-ARM 42X72 X-RAY (DRAPES) ×4 IMPLANT
DRAPE LAPAROTOMY 100X72X124 (DRAPES) ×2 IMPLANT
DRAPE POUCH INSTRU U-SHP 10X18 (DRAPES) ×2 IMPLANT
DRAPE PROXIMA HALF (DRAPES) IMPLANT
DRAPE SURG 17X23 STRL (DRAPES) ×2 IMPLANT
DRESSING TELFA 8X3 (GAUZE/BANDAGES/DRESSINGS) IMPLANT
DRSG EMULSION OIL 3X3 NADH (GAUZE/BANDAGES/DRESSINGS) IMPLANT
DURAPREP 26ML APPLICATOR (WOUND CARE) ×2 IMPLANT
ELECT REM PT RETURN 9FT ADLT (ELECTROSURGICAL) ×2
ELECTRODE REM PT RTRN 9FT ADLT (ELECTROSURGICAL) ×1 IMPLANT
EVACUATOR 3/16  PVC DRAIN (DRAIN)
EVACUATOR 3/16 PVC DRAIN (DRAIN) IMPLANT
GAUZE SPONGE 4X4 16PLY XRAY LF (GAUZE/BANDAGES/DRESSINGS) ×1 IMPLANT
GLOVE BIOGEL PI IND STRL 8 (GLOVE) ×2 IMPLANT
GLOVE BIOGEL PI INDICATOR 8 (GLOVE) ×2
GLOVE ECLIPSE 7.5 STRL STRAW (GLOVE) ×4 IMPLANT
GLOVE EXAM NITRILE LRG STRL (GLOVE) IMPLANT
GLOVE EXAM NITRILE MD LF STRL (GLOVE) IMPLANT
GLOVE EXAM NITRILE XL STR (GLOVE) IMPLANT
GLOVE EXAM NITRILE XS STR PU (GLOVE) IMPLANT
GOWN BRE IMP SLV AUR LG STRL (GOWN DISPOSABLE) IMPLANT
GOWN BRE IMP SLV AUR XL STRL (GOWN DISPOSABLE) ×6 IMPLANT
GOWN STRL REIN 2XL LVL4 (GOWN DISPOSABLE) ×2 IMPLANT
KIT BASIN OR (CUSTOM PROCEDURE TRAY) ×2 IMPLANT
KIT POSITION SURG JACKSON T1 (MISCELLANEOUS) ×2 IMPLANT
KIT ROOM TURNOVER OR (KITS) ×2 IMPLANT
MILL MEDIUM DISP (BLADE) ×3 IMPLANT
NDL HYPO 25X1 1.5 SAFETY (NEEDLE) ×1 IMPLANT
NDL SPNL 18GX3.5 QUINCKE PK (NEEDLE) IMPLANT
NEEDLE 18GX1X1/2 (RX/OR ONLY) (NEEDLE) ×2 IMPLANT
NEEDLE BONE MARROW 8GAX6 (NEEDLE) ×1 IMPLANT
NEEDLE HYPO 25X1 1.5 SAFETY (NEEDLE) ×2 IMPLANT
NEEDLE SPNL 18GX3.5 QUINCKE PK (NEEDLE) IMPLANT
NS IRRIG 1000ML POUR BTL (IV SOLUTION) ×3 IMPLANT
PACK LAMINECTOMY NEURO (CUSTOM PROCEDURE TRAY) ×2 IMPLANT
PAD ARMBOARD 7.5X6 YLW CONV (MISCELLANEOUS) ×6 IMPLANT
PATTIES SURGICAL .5 X.5 (GAUZE/BANDAGES/DRESSINGS) IMPLANT
PATTIES SURGICAL .5 X1 (DISPOSABLE) IMPLANT
PATTIES SURGICAL 1X1 (DISPOSABLE) ×1 IMPLANT
PLATE BN 1.75-2.15XLO BAR (Orthopedic Implant) IMPLANT
ROD L635 90MM PREBENT (Rod) ×2 IMPLANT
SCREW PEDICLE VA L635 5.5X50M (Screw) ×2 IMPLANT
SCREW PEDICLE VA L635 6.5X55M (Screw) ×4 IMPLANT
SCREW SET BREAK OFF (Screw) ×9 IMPLANT
SPONGE GAUZE 4X4 12PLY (GAUZE/BANDAGES/DRESSINGS) ×2 IMPLANT
SPONGE LAP 4X18 X RAY DECT (DISPOSABLE) IMPLANT
SPONGE NEURO XRAY DETECT 1X3 (DISPOSABLE) ×1 IMPLANT
SPONGE SURGIFOAM ABS GEL 100 (HEMOSTASIS) IMPLANT
STAPLER SKIN PROX WIDE 3.9 (STAPLE) IMPLANT
STRIP BIOACTIVE VITOSS 25X100X (Neuro Prosthesis/Implant) ×1 IMPLANT
STRIP CLOSURE SKIN 1/2X4 (GAUZE/BANDAGES/DRESSINGS) ×2 IMPLANT
SUT ETHILON 3 0 FSL (SUTURE) IMPLANT
SUT PROLENE 6 0 BV (SUTURE) IMPLANT
SUT VIC AB 1 CT1 18XBRD ANBCTR (SUTURE) ×2 IMPLANT
SUT VIC AB 1 CT1 8-18 (SUTURE) ×6
SUT VIC AB 2-0 CP2 18 (SUTURE) ×6 IMPLANT
SYR 20CC LL (SYRINGE) ×3 IMPLANT
SYR 3ML LL SCALE MARK (SYRINGE) ×8 IMPLANT
SYR 5ML LL (SYRINGE) IMPLANT
SYR CONTROL 10ML LL (SYRINGE) ×3 IMPLANT
SYR INSULIN 1ML 31GX6 SAFETY (SYRINGE) IMPLANT
TAPE CLOTH SURG 4X10 WHT LF (GAUZE/BANDAGES/DRESSINGS) ×1 IMPLANT
TOWEL OR 17X24 6PK STRL BLUE (TOWEL DISPOSABLE) ×2 IMPLANT
TOWEL OR 17X26 10 PK STRL BLUE (TOWEL DISPOSABLE) ×2 IMPLANT
TRAP SPECIMEN MUCOUS 40CC (MISCELLANEOUS) ×2 IMPLANT
TRAY FOLEY CATH 14FRSI W/METER (CATHETERS) ×2 IMPLANT
WATER STERILE IRR 1000ML POUR (IV SOLUTION) ×2 IMPLANT

## 2012-07-31 NOTE — Anesthesia Procedure Notes (Signed)
Procedure Name: Intubation Date/Time: 07/31/2012 7:35 AM Performed by: Elon Alas Pre-anesthesia Checklist: Patient identified, Emergency Drugs available, Timeout performed, Patient being monitored and Suction available Patient Re-evaluated:Patient Re-evaluated prior to inductionPreoxygenation: Pre-oxygenation with 100% oxygen Intubation Type: IV induction Ventilation: Oral airway inserted - appropriate to patient size and Mask ventilation without difficulty Number of attempts: 1 Airway Equipment and Method: Stylet and Video-laryngoscopy Placement Confirmation: positive ETCO2,  ETT inserted through vocal cords under direct vision and breath sounds checked- equal and bilateral Secured at: 25 cm Tube secured with: Tape Dental Injury: Bloody posterior oropharynx and Injury to lip

## 2012-07-31 NOTE — Transfer of Care (Signed)
Immediate Anesthesia Transfer of Care Note  Patient: Brent Allison  Procedure(s) Performed: Procedure(s) (LRB) with comments: POSTERIOR LUMBAR FUSION 2 LEVEL (N/A) - L2-L4 Laminectomy with L23 L34 posterolateral arthrodesis and posterior segmental instrumentation  Patient Location: PACU  Anesthesia Type: General  Level of Consciousness: sedated  Airway & Oxygen Therapy: Patient remains intubated per anesthesia plan and Patient placed on Ventilator (see vital sign flow sheet for setting)  Post-op Assessment: Report given to PACU RN and Post -op Vital signs reviewed and stable  Post vital signs: Reviewed and stable  Complications: No apparent anesthesia complications

## 2012-07-31 NOTE — H&P (Signed)
Subjective: Patient is a 66 y.o. male who is admitted for treatment of neurogenic claudication secondary to lumbar stenosis secondary to multilevel multifactorial degenerative changes including spondylosis and degenerative disc disease. Patient's had progressively worsening low back and bilateral extremity pain. He status post a previous L4-5 arthrodesis. Patient is admitted for a lumbar laminectomy and extension of his posterior lateral arthrodesis.   Patient Active Problem List   Diagnosis Date Noted  . DVT (deep venous thrombosis) 02/02/2012  . Multiple myeloma 10/01/2011  . Multiple myeloma 10/01/2011  . Neuropathy 10/01/2011  . Sleep apnea   . Dyslipidemia    Past Medical History  Diagnosis Date  . Dyslipidemia   . H/O multiple myeloma   . Spastic quadriparesis   . DVT (deep venous thrombosis)     right  . Pneumonia     hx of with last time in Nov 2012  . Joint pain   . Sleep apnea     CPAP- q night, [place] March- 2 yrs.   . Prostate cancer     amyloidosis, multiple myeloma   . Arthritis     lumbar DDD  . Peripheral vascular disease     DVT- 01/2012, follows by Dr. Truett Perna, lovenox maintained since Spring 2013 , pt. aware that last dose is sch. for 07/22/2012  . Blood dyscrasia     plasma cell dyscrasia with associated amyloidosis    Past Surgical History  Procedure Date  . Prostatectomy around 2005  . Posterior laminectomy / decompression lumbar spine around 2007    L4-5  . Carpal tunnel release 2009    left  . Back surgery   . Anterior cervical decomp/discectomy fusion 02/26/2012    Procedure: ANTERIOR CERVICAL DECOMPRESSION/DISCECTOMY FUSION 2 LEVELS;  Surgeon: Hewitt Shorts, MD;  Location: [facility];  Service: Neurosurgery;  Laterality: N/A;  C3-4 C4-5 Anterior cervical decompression/diskectomy, fusion  . Tonsillectomy     as a child  . Hernia repair     around 1985  . Cardiac catheterization 2010  . Colonoscopy   . Lipoma excision around 1985   removed from one of his shoulders  . Posterior cervical fusion/foraminotomy 04/06/2012    Procedure: POSTERIOR CERVICAL FUSION/FORAMINOTOMY LEVEL 3;  Surgeon: Hewitt Shorts, MD;  Location: [facility];  Service: Neurosurgery;  Laterality: N/A;  C3 and C4 laminectomy with C23 C34 C45 posterior cervical arthrodesis with instrumentation  . Ivt     IVC filter placed d/t blood clots, removed- July- 2013    Prescriptions prior to admission  Medication Sig Dispense Refill  . aspirin EC 81 MG tablet Take 81 mg by mouth daily.      Marland Kitchen enoxaparin (LOVENOX) 150 MG/ML injection INJECT 0.87MLS (130MG S TOTAL) INTO THE  SKIN DAILY  30 mL  0   No Known Allergies  History  Substance Use Topics  . Smoking status: Former Smoker    Quit date: 07/21/1979  . Smokeless tobacco: Not on file   Comment: quit 25+yrs ago  . Alcohol Use: 0.6 oz/week    1 Glasses of wine per week     occasionally    Family History  Problem Relation Age of Onset  . Anesthesia problems Neg Hx   . Hypotension Neg Hx   . Malignant hyperthermia Neg Hx   . Pseudochol deficiency Neg Hx      Review of Systems A comprehensive review of systems was negative.  Objective: Vital signs in last 24 hours: Temp:  [98.2 F (36.8 C)] 98.2  F (36.8 C) (09/09 4098) Pulse Rate:  [57] 57  (09/09 0642) Resp:  [16] 16  (09/09 0642) BP: (108)/(70) 108/70 mmHg (09/09 0642) SpO2:  [98 %] 98 % (09/09 0642)  EXAM: Patient well-developed well-nourished white male in no acute distress. Lungs are clear to auscultation , the patient has symmetrical respiratory excursion. Heart has a regular rate and rhythm normal S1 and S2 no murmur.   Abdomen is soft nontender nondistended bowel sounds are present. Extremity examination shows no clubbing cyanosis or edema. Musculoskeletal examination shows no tenderness to palpation over the lumbar spinous processes or paralumbar musculature. Each of the flexor degrees, but cannot stand to an upright position, but  can't extend. Straight leg raising is negative bilaterally. Motor examination shows 5 over 5 strength in the lower extremities including the iliopsoas quadriceps dorsiflexor extensor hallicus  longus and plantar flexor bilaterally. Sensation is intact to pinprick in the distal lower extremities. Reflexes are symmetrical bilaterally. No pathologic reflexes are present. Patient has a normal gait and stance.   Data Review:CBC    Component Value Date/Time   WBC 5.9 07/20/2012 0937   WBC 6.1 05/04/2012 1227   RBC 4.15* 07/20/2012 0937   RBC 3.71* 05/04/2012 1227   HGB 13.1 07/20/2012 0937   HGB 11.2* 05/04/2012 1227   HCT 38.3* 07/20/2012 0937   HCT 34.4* 05/04/2012 1227   PLT 175 07/20/2012 0937   PLT 198 05/04/2012 1227   MCV 92.3 07/20/2012 0937   MCV 92.7 05/04/2012 1227   MCH 31.6 07/20/2012 0937   MCH 30.2 05/04/2012 1227   MCHC 34.2 07/20/2012 0937   MCHC 32.6 05/04/2012 1227   RDW 13.6 07/20/2012 0937   RDW 16.7* 05/04/2012 1227   LYMPHSABS 1.6 07/20/2012 0937   LYMPHSABS 1.8 03/31/2012 1821   MONOABS 0.8 07/20/2012 0937   MONOABS 1.0 03/31/2012 1821   EOSABS 0.1 07/20/2012 0937   EOSABS 0.2 03/31/2012 1821   BASOSABS 0.0 07/20/2012 0937   BASOSABS 0.0 03/31/2012 1821                          BMET    Component Value Date/Time   NA 141 07/20/2012 1239   K 4.2 07/20/2012 1239   CL 105 07/20/2012 1239   CO2 29 07/20/2012 1239   GLUCOSE 98 07/20/2012 1239   BUN 12 07/20/2012 1239   CREATININE 0.89 07/20/2012 1239   CALCIUM 10.3 07/20/2012 1239   GFRNONAA 87* 07/20/2012 1239   GFRAA >90 07/20/2012 1239     Assessment/Plan: Patient with multilevel multifactorial lumbar stenosis admitted for a L2-L4 decompressive lumbar laminectomy and L2-L4 posterior lateral arthrodesis. I've discussed with the patient the nature of his condition, the nature the surgical procedure, the typical length of surgery, hospital stay, and overall recuperation, the limitations postoperatively, and risks of surgery. I discussed  risks including risks of infection, bleeding, possibly need for transfusion, the risk of nerve root dysfunction with pain, weakness, numbness, or paresthesias, the risk of dural tear and CSF leakage and possible need for further surgery, the risk of failure of the arthrodesis and possibly for further surgery, the risk of anesthetic complications including myocardial infarction, stroke, pneumonia, and death. We discussed the need for postoperative immobilization in a lumbar brace. Understanding all this the patient does wish to proceed with surgery and is admitted for such.     Hewitt Shorts, MD 07/31/2012 7:18 AM

## 2012-07-31 NOTE — Progress Notes (Signed)
07/31/12 extubated pt to simple mask pt sat are 100% pt is stable RT will continue to monitor

## 2012-07-31 NOTE — Progress Notes (Signed)
Filed Vitals:   07/31/12 1745 07/31/12 1800 07/31/12 1815 07/31/12 1830  BP: 95/62 97/54 96/59  104/59  Pulse: 80 67 76 73  Temp:   98.3 F (36.8 C) 98.4 F (36.9 C)  TempSrc:      Resp: 15 8 13 16   Weight:      SpO2: 98% 98% 98% 98%    Patient resting comfortably in bed. Dressing clean and dry. Moving all 4 extremities well. Foley to straight drainage. Postoperatively held in the PACU for extended period of time because of significant swelling and bruising on the lips and tongue. That steadily diminished over time; the patient had been supported on a ventilator until the swelling up sufficiently diminished, and then was weaned and extubated, and has done well since extubation.  Plan: Doing well following surgery. We'll progress to postoperative recovery.  Hewitt Shorts, MD 07/31/2012, 6:48 PM

## 2012-07-31 NOTE — Anesthesia Preprocedure Evaluation (Addendum)
Anesthesia Evaluation  Patient identified by MRN, date of birth, ID band Patient awake    Reviewed: Allergy & Precautions, H&P , NPO status , Patient's Chart, lab work & pertinent test results  Airway Mallampati: III TM Distance: >3 FB Neck ROM: Full    Dental No notable dental hx. (+) Teeth Intact and Dental Advisory Given   Pulmonary neg pulmonary ROS, sleep apnea and Continuous Positive Airway Pressure Ventilation ,  breath sounds clear to auscultation  Pulmonary exam normal       Cardiovascular + Peripheral Vascular Disease and DVT negative cardio ROS  Rhythm:Regular Rate:Normal     Neuro/Psych negative neurological ROS  negative psych ROS   GI/Hepatic negative GI ROS, Neg liver ROS,   Endo/Other  negative endocrine ROSMultiple myeloma  Renal/GU negative Renal ROS  negative genitourinary   Musculoskeletal   Abdominal   Peds  Hematology negative hematology ROS (+)   Anesthesia Other Findings   Reproductive/Obstetrics negative OB ROS                         Anesthesia Physical Anesthesia Plan  ASA: III  Anesthesia Plan: General   Post-op Pain Management:    Induction: Intravenous  Airway Management Planned: Oral ETT and Video Laryngoscope Planned  Additional Equipment:   Intra-op Plan:   Post-operative Plan: Extubation in OR  Informed Consent: I have reviewed the patients History and Physical, chart, labs and discussed the procedure including the risks, benefits and alternatives for the proposed anesthesia with the patient or authorized representative who has indicated his/her understanding and acceptance.   Dental advisory given  Plan Discussed with: Anesthesiologist and Surgeon  Anesthesia Plan Comments:         Anesthesia Quick Evaluation

## 2012-07-31 NOTE — Progress Notes (Addendum)
[name] will be ventilated post op initially to secure an adequate airway due to swelling/bleeding.  Dr. Sampson Goon and Dr. Newell Coral at bedside to discuss care.  Plan is to continue propofol infusion, continue mechanical ventilation and airway until swelling is reduced and patient can be safely extubated. [name] has multilple area of bruising and soft tissue swelling including eyes, mouth, bilateral lower quadrants of his abdomen, and on his scrotum.  Dr. Newell Coral and Dr. Sampson Goon are aware.

## 2012-07-31 NOTE — Anesthesia Postprocedure Evaluation (Signed)
  Anesthesia Post-op Note  Patient: Brent Allison  Procedure(s) Performed: Procedure(s) (LRB) with comments: POSTERIOR LUMBAR FUSION 2 LEVEL (N/A) - L2-L4 Laminectomy with L23 L34 posterolateral arthrodesis and posterior segmental instrumentation  Patient Location: PACU  Anesthesia Type: General  Level of Consciousness: awake  Airway and Oxygen Therapy: Patient Spontanous Breathing and Patient connected to face mask oxygen  Post-op Pain: none  Post-op Assessment: Post-op Vital signs reviewed, Patient's Cardiovascular Status Stable, Respiratory Function Stable, Patent Airway and No signs of Nausea or vomiting  Post-op Vital Signs: Reviewed and stable  Complications: No apparent anesthesia complications

## 2012-07-31 NOTE — Op Note (Signed)
07/31/2012  12:49 PM  PATIENT:  Brent Allison  66 y.o. male  PRE-OPERATIVE DIAGNOSIS:  lumbar stenosis lumbar spondylosis lumbar degenerative disc disease lumbar radiculopathy  POST-OPERATIVE DIAGNOSIS:  lumbar stenosis, lumbar spondylosis, lumbar degenerative disc disease, lumbar radiculopathy  PROCEDURE:  Procedure(s): POSTERIOR LUMBAR FUSION 2 LEVEL:  L2-L4 decompressive lumbar laminectomy, including facetectomies and foraminotomies, with decompression of the exiting L2, L3, and L4 nerve roots bilaterally, with microdissection and microsurgical technique, and a bilateral L2-L4 posterolateral arthrodesis, with Legacy posterior instrumentation, Vitoss BA with bone marrow aspirate, and locally harvested morcellized autograft, tying in the posterior instrumentation to his previous L4-5 posterior instrumentation.  SURGEON:  Surgeon(s): Hewitt Shorts, MD  ASSISTANTS: Barnett Abu, M.D.  ANESTHESIA:   general  EBL:  Total I/O In: 2810 [I.V.:2500; Blood:310] Out: 925 [Urine:175; Blood:750]  BLOOD ADMINISTERED:350 CC CELLSAVER  COUNT: Correct per nursing staff  DICTATION: Patient was brought to the operating room, placed under general endotracheal anesthesia. Patient was turned to a prone position. The lumbar region was prepped with Betadine soap and solution and draped in a sterile fashion. The midline was infiltrated with local site with epinephrine, and the previous midline incision was reopened and extended rostrally. Dissection was carried down to subcutaneous tissue, bipolar cautery and electrocautery used to maintain hemostasis. The lumbar fascia was incised bilaterally and the paraspinal musculature was dissected from the spinous process lamina in a subperiosteal fashion. Subcutaneous retractors were placed, and an x-ray taken for localization. The L3-4 and L4-5 interlaminar spaces were identified. We further dissected laterally exposing the previous posterior instrumentation  located from L4-L5. We then dissected laterally over the hypertrophic facet at L3-4 and expose the L3 transverse process bilaterally, and we dissected further rostrally exposing the L2 transverse process bilaterally. We then proceeded with the laminectomy. We began using double-action rongeurs, and continued the laminectomy using the high-speed drill and Kerrison punches. Medial facetectomy was performed as well, and then extended the decompression laterally into the neural foramina bilaterally to the L2, L3, and L4 nerve roots bilaterally. There is significant thickening of the ligamentum flavum both centrally as well as extending laterally into the neural foramina. In the and good decompression of the thecal sac and the exiting nerve roots was achieved. We then proceeded with the posterior lateral arthrodesis. Entry points were identified bilaterally for the L2 and L3 pedicles bilaterally. Each of the pedicles was probed, we did aspirate bone marrow aspirate from the vertebral bodies, which was injected over a 10 cc strip of Vitoss BA. Once each of the pedicles was probed, there examined with the ball probe, good bony surfaces were noted. We then tapped the L3 pedicles with a 5.5 mm tap, and the L2 pedicles with a 4.5 mm tap. Each of the pedicles was examined with the ball probe again and again good threading was found and good bony surfaces were found. We then placed 5.5 x 50 mm screws bilaterally at L2 and 6.5 x 55 mm screws bilaterally at L3. We then removed the locking caps from the previous L4-5 construct, and then removed the rods. The screw heads were cleaned of scar tissue. We then selected a 90 mm in length pre-lordosed rods. There placed within the screw heads and then locking caps were placed securing the rods within the screw heads. Once all the locking caps were placed, they were final tightened against a counter torque. We then packed the lateral gutter with a combination of Vitoss BA with bone  marrow aspirate as well  as locally harvested morcellized autograft. We then placed a cross-link between his L3 and L4 screws, securing it to the rods and tightened down the center connection. We placed a pledget of Gelfoam with thrombin the laminectomy defect, good hemostasis was established. The wound irrigated numerous times the procedure with saline solution and bacitracin solution. We proceeded with closure. Paraspinal muscles were approximate interrupted undyed 1 Vicryl sutures. The fascia was closed with interrupted undyed 1 Vicryl sutures. Scarpa's fascia closed interrupted undyed 1 Vicryl sutures. The subcutaneous was closed with interrupted inverted 2-0 undyed Vicryl sutures. Skin is approximate Dermabond. We'll was dressed with sterile gauze and Hypafix. Following surgery the patient was turned back in supine position however the anesthesia service noted moderate swelling on the lips and palm some bruising on the inside of the lips, also some mild bruising on the penis and scrotum is mild swelling of the scrotum. Dr. Sampson Goon the anesthesiologist felt that prudent to not reversed the patient from the anesthetic, but rather transferred to the recovery room to be positioned in the upright position, to help allow the swelling to diminish, and to monitor and regarding the question of extubation over the next several hours. Both he and I had an opportunity discuss the situation with the patient's wife.  PLAN OF CARE: Admit to inpatient   PATIENT DISPOSITION:  PACU - guarded condition.   Delay start of Pharmacological VTE agent (>24hrs) due to surgical blood loss or risk of bleeding:  yes

## 2012-07-31 NOTE — Preoperative (Signed)
Beta Blockers   Reason not to administer Beta Blockers:Not Applicable 

## 2012-08-01 LAB — BASIC METABOLIC PANEL
BUN: 23 mg/dL (ref 6–23)
CO2: 26 mEq/L (ref 19–32)
Calcium: 8.9 mg/dL (ref 8.4–10.5)
Chloride: 105 mEq/L (ref 96–112)
Creatinine, Ser: 1.03 mg/dL (ref 0.50–1.35)
GFR calc Af Amer: 85 mL/min — ABNORMAL LOW (ref >90)
GFR calc non Af Amer: 73 mL/min — ABNORMAL LOW (ref >90)
Glucose, Bld: 133 mg/dL — ABNORMAL HIGH (ref 70–99)
Potassium: 3.7 mEq/L (ref 3.5–5.1)
Sodium: 138 mEq/L (ref 135–145)

## 2012-08-01 LAB — CBC WITH DIFFERENTIAL/PLATELET
Basophils Absolute: 0 10*3/uL (ref 0.0–0.1)
Basophils Relative: 0 % (ref 0–1)
Eosinophils Absolute: 0.2 10*3/uL (ref 0.0–0.7)
Eosinophils Relative: 2 % (ref 0–5)
HCT: 30.8 % — ABNORMAL LOW (ref 39.0–52.0)
Hemoglobin: 10.5 g/dL — ABNORMAL LOW (ref 13.0–17.0)
Lymphocytes Relative: 12 % (ref 12–46)
Lymphs Abs: 1.2 10*3/uL (ref 0.7–4.0)
MCH: 32 pg (ref 26.0–34.0)
MCHC: 34.1 g/dL (ref 30.0–36.0)
MCV: 93.9 fL (ref 78.0–100.0)
Monocytes Absolute: 1.2 10*3/uL — ABNORMAL HIGH (ref 0.1–1.0)
Monocytes Relative: 12 % (ref 3–12)
Neutro Abs: 7.3 10*3/uL (ref 1.7–7.7)
Neutrophils Relative %: 75 % (ref 43–77)
Platelets: 131 10*3/uL — ABNORMAL LOW (ref 150–400)
RBC: 3.28 MIL/uL — ABNORMAL LOW (ref 4.22–5.81)
RDW: 13.5 % (ref 11.5–15.5)
WBC: 9.8 10*3/uL (ref 4.0–10.5)

## 2012-08-01 MED FILL — Heparin Sodium (Porcine) Inj 1000 Unit/ML: INTRAMUSCULAR | Qty: 30 | Status: AC

## 2012-08-01 MED FILL — Sodium Chloride IV Soln 0.9%: INTRAVENOUS | Qty: 1000 | Status: AC

## 2012-08-01 MED FILL — Sodium Chloride Irrigation Soln 0.9%: Qty: 3000 | Status: AC

## 2012-08-01 NOTE — Progress Notes (Signed)
Pt has home unit and will place on himself.

## 2012-08-01 NOTE — Progress Notes (Signed)
Filed Vitals:   07/31/12 2300 07/31/12 2344 08/01/12 0431 08/01/12 0440  BP: 91/55 99/66  87/50  Pulse: 81 72 87 89  Temp:  99.4 F (37.4 C) 99.2 F (37.3 C)   TempSrc:      Resp:  16 16   Weight:      SpO2:  95% 98%      Patient resting in bed comfortably. Foley DC'd about an hour and a half ago, no void yet, but urine output prior to discontinue the Foley had been low, and his IV had been left running until an hour and a half ago. Good strength extremities. Dressing clean and dry.  Plan: We'll check BMET and CBC with differential. Encouraged to ambulate.  Hewitt Shorts, MD 08/01/2012, 7:41 AM

## 2012-08-01 NOTE — Progress Notes (Signed)
UR COMPLETED  

## 2012-08-01 NOTE — Plan of Care (Signed)
Problem: Consults Goal: Diagnosis - Spinal Surgery Outcome: Completed/Met Date Met:  08/01/12 Thoraco/Lumbar Spine Fusion

## 2012-08-02 MED ORDER — OXYCODONE-ACETAMINOPHEN 5-325 MG PO TABS: 1.0000 | ORAL_TABLET | ORAL | Status: AC | PRN

## 2012-08-02 NOTE — Discharge Summary (Signed)
Physician Discharge Summary  Patient ID: INMAN KOSIN MRN: [id number] DOB/AGE: 67-Nov-1947 67 y.o.  Admit date: 07/31/2012 Discharge date: 08/02/2012  Admission Diagnoses:  Lumbar stenosis, lumbar spondylosis, lumbar degenerative disc disease, lumbar radiculopathy  Discharge Diagnoses:   Lumbar stenosis, lumbar spondylosis, lumbar degenerative disc disease, lumbar radiculopathy  Discharged Condition: good  Hospital Course: Patient was admitted, underwent an L2-L4 decompressive lumbar laminectomy, and bilateral L2-L4 posterior lumbar arthrodesis with extension of postreduction patient to incorporate the previous L4-5 posterior instrumentation. He has done well following surgery. He's been up in a being actively. His wound is healing nicely, there is no swelling, erythema, or drainage. He is being discharged home with instructions regarding wound care and activities. He is to return for followup with me in 3 weeks.  Discharge Exam: Blood pressure 94/57, pulse 88, temperature 98.6 F (37 C), temperature source Oral, resp. rate 16, weight 88.2 kg (194 lb 7.1 oz), SpO2 98.00%.  Disposition: Home  Discharge Orders    Future Appointments: Provider: Department: Dept Phone: Center:   08/11/2012 10:00 AM Ladene Artist, MD [facility] [phone] None       Medication List     As of 08/02/2012  8:30 AM    STOP taking these medications         enoxaparin 150 MG/ML injection   Commonly known as: LOVENOX      TAKE these medications         aspirin EC 81 MG tablet   Take 81 mg by mouth daily.      oxyCODONE-acetaminophen 5-325 MG per tablet   Commonly known as: PERCOCET/ROXICET   Take 1-2 tablets by mouth every 4 (four) hours as needed for pain.         Signed: Hewitt Shorts, MD 08/02/2012, 8:30 AM

## 2012-08-02 NOTE — Progress Notes (Signed)
Pt given D/C instructions with Rx. Pt verbalized understanding. Pt D/C'd home with wife via wheelchair @ 1040 per MD order. Rema Fendt, RN

## 2012-08-10 ENCOUNTER — Telehealth: Payer: Self-pay | Admitting: *Deleted

## 2012-08-10 NOTE — Telephone Encounter (Signed)
Requesting a transthoracic ECHO be done on patient and results faxed to [phone]. Was last seen at [facility] on 07/26/12. She will fax visit note for MD review. Patient agrees to test and does not have cardiologist-Dr. Truett Perna may use his discretion. Prefers am appointment.

## 2012-08-11 ENCOUNTER — Ambulatory Visit (HOSPITAL_BASED_OUTPATIENT_CLINIC_OR_DEPARTMENT_OTHER): Payer: Medicare Other | Admitting: Oncology

## 2012-08-11 ENCOUNTER — Telehealth: Payer: Self-pay | Admitting: Oncology

## 2012-08-11 VITALS — BP 101/51 | HR 83 | Temp 97.2°F | Resp 20 | Ht 73.0 in | Wt 190.8 lb

## 2012-08-11 DIAGNOSIS — I82409 Acute embolism and thrombosis of unspecified deep veins of unspecified lower extremity: Secondary | ICD-10-CM

## 2012-08-11 DIAGNOSIS — E859 Amyloidosis, unspecified: Secondary | ICD-10-CM

## 2012-08-11 DIAGNOSIS — C9000 Multiple myeloma not having achieved remission: Secondary | ICD-10-CM

## 2012-08-11 NOTE — Progress Notes (Signed)
[facility]    OFFICE PROGRESS NOTE   INTERVAL HISTORY:   He returns as scheduled. He underwent a posterior lumbar fusion and decompressive laminectomy on 07/31/2012. He is recovering from surgery. The back pain has improved. He continues to have good arm and leg strength.  No symptom of venous thrombosis.  He saw Dr. Marissa Calamity on 07/26/2012 and the plan is to proceed with stem cell therapy.  Objective:  Vital signs in last 24 hours:  Blood pressure 101/51, pulse 83, temperature 97.2 F (36.2 C), resp. rate 20, height 6\' 1"  (1.854 m), weight 190 lb 12.8 oz (86.546 kg).    HEENT: Macroglossia Resp: Lungs clear bilaterally Cardio: Regular rate and rhythm GI: No hepatosplenomegaly Vascular: The right lower leg is slightly larger than the left side, no edema, no erythema Neuro: The motor exam appears intact in the upper and lower extremities  Skin: Periorbital ecchymoses.    Medications: I have reviewed the patient's current medications.  Assessment/Plan: 1. 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 2. "Bruising" at the eyelids and mouth: Likely related to amyloidosis, persistent. 3. Numbness and loss of vibratory sense at the fingertips: This predated Velcade-based therapy, but worsened. Now much improved following cervical spine surgery. 4. Elevated serum free lambda light chains. The lambda light chains were lower on November 16 and slightly higher on 12/14/2011. 5. Lambda light chain proteinuria. 6. Bone marrow plasmacytosis: Variable increase in plasma cells noted on the bone marrow biopsy 07/29/2011 with plasma cells estimated to represent between 4% and 20% of the cellular population. 7. Remote history of prostate cancer. 8. Sleep apnea. 9. Dyslipidemia. 10. Report of pneumonia on 2 occasions in 2011. 11. Admission to a hospital in [place], [place] October 2012 with "pneumonia." A chest x-ray at [place] on November 2 was negative .   12. Low serum immunoglobulin G level. 13. Borderline-low hemoglobin level, improved. 14. Plasma cell dyscrasia with associated amyloidosis.  a. Initiation of systemic therapy with Cytoxan, Velcade, and Decadron 08/12/2011. Cycle #2 was initiated on 09/16/2011. Cycle #3 was initiated on 10/15/2011. Velcade was placed on hold due to neuropathy. b. The serum free lambda light chains were decreased on October 08 2011. c. The serum free lambda light chains were slightly increased on 12/14/2011, lower on 12/29/2011 and 02/02/2012. d. Initiation of Revlimid/Decadron February 2013, cycle 2 started on 01/28/2012. 15. Loss of "balance "and proximal motor weakness secondary to cervical stenosis-status post decompression surgery on 02/19/2012. Improved, but not resolved stenosis with mass effect on the spinal cord noted on a repeat MRI 03/28/2012. He underwent further decompression surgery on 04/06/2012. The ataxia, weakness, and peripheral numbness is much improved.  16. Right lower Extremity deep vein thrombosis 02/02/2012-a Doppler ultrasound confirmed a gastrocnemius and peroneal deep vein thrombosis. An IVC filter was placed prior to surgery and he was maintained on Lovenox. The IVC filter was removed on 05/04/2012. Now maintained off of anticoagulation 17. Anorexia following cervical spine surgery. Improved.  18. Episode of flank pain and hematuria in 03/31/2012-etiology unclear. He completed a course of antibiotics. The hematuria and pain have resolved.  19. Rectal wall thickening noted on a CT of the pelvis 03/31/2012.  20. Recent colonoscopy showed an area of abnormality at the rectum with biopsy positive for amyloid.  21. Lung nodule noted on the CT 03/31/2012-we will consider obtaining a followup CT in 3-6 months. 22 lumbar stenosis-status post surgery on 07/31/2012.    Disposition:  He is recovering from the recent lumbar surgery.  He did not receive anticoagulation surrounding surgery. There is  no sign of venous thrombosis.  He is undergoing a pre-transplant evaluation at [facility]. We will schedule an echocardiogram to evaluate for amyloidosis involving the heart.  [name] will return for an office visit in 2 months. We will see him sooner as needed pending the transplant evaluation.   [name], MD  08/11/2012  7:40 PM

## 2012-08-11 NOTE — Telephone Encounter (Signed)
gv pt appt schedule for November and echo appt for 9/26. Per pt he has no preference as to where echo is done. Copy of echo order to [facility] for precert

## 2012-08-17 ENCOUNTER — Ambulatory Visit (HOSPITAL_COMMUNITY)
Admission: RE | Admit: 2012-08-17 | Discharge: 2012-08-17 | Disposition: A | Discharge status: Home / Self Care | Payer: Medicare Other | Source: Ambulatory Visit | Attending: Oncology | Admitting: Oncology

## 2012-08-17 DIAGNOSIS — E785 Hyperlipidemia, unspecified: Secondary | ICD-10-CM | POA: Insufficient documentation

## 2012-08-17 DIAGNOSIS — E859 Amyloidosis, unspecified: Secondary | ICD-10-CM | POA: Insufficient documentation

## 2012-08-17 DIAGNOSIS — Z87891 Personal history of nicotine dependence: Secondary | ICD-10-CM | POA: Insufficient documentation

## 2012-08-17 DIAGNOSIS — Z86718 Personal history of other venous thrombosis and embolism: Secondary | ICD-10-CM | POA: Insufficient documentation

## 2012-08-17 DIAGNOSIS — I059 Rheumatic mitral valve disease, unspecified: Secondary | ICD-10-CM | POA: Insufficient documentation

## 2012-08-17 DIAGNOSIS — C9000 Multiple myeloma not having achieved remission: Secondary | ICD-10-CM | POA: Insufficient documentation

## 2012-08-17 NOTE — Progress Notes (Signed)
*  PRELIMINARY RESULTS* Echocardiogram 2D Echocardiogram has been performed.  Jeryl Columbia 08/17/2012, 11:41 AM

## 2012-08-24 ENCOUNTER — Telehealth: Payer: Self-pay | Admitting: *Deleted

## 2012-08-24 NOTE — Telephone Encounter (Signed)
Message copied by Phillis Knack on Thu Aug 24, 2012  2:36 PM ------      Message from: Wandalee Ferdinand      Created: Thu Aug 24, 2012  1:24 PM                   ----- Message -----         From: Ladene Artist, MD         Sent: 08/21/2012  10:29 PM           To: Wandalee Ferdinand, RN, Glori Luis, RN, #            Please call patient, Echo looks ok, copy to Dr. Nada Libman

## 2012-08-24 NOTE — Telephone Encounter (Signed)
Pt notified that Echo looks OK per Dr Truett Perna. Pt asked that it also get copied to Dr Sheryn Bison. He has an upcoming appt with [place].

## 2012-09-19 ENCOUNTER — Telehealth: Payer: Self-pay | Admitting: *Deleted

## 2012-09-19 NOTE — Telephone Encounter (Signed)
Patient asking to speak with Dr. Truett Perna personally. Was seen at [facility] in September and has not heard from them since. Was told they are waiting for "special testing results" to be returned. Wishes to discuss with Dr. Truett Perna.

## 2012-09-22 ENCOUNTER — Telehealth: Payer: Self-pay | Admitting: *Deleted

## 2012-09-22 NOTE — Telephone Encounter (Signed)
Reports he has heard nothing from [facility] yet on status of labs. Spoke with coordinator earlier in week, but she is out until Monday. Asking for Dr. Truett Perna to see if he can find anything sooner.

## 2012-09-22 NOTE — Telephone Encounter (Signed)
Reports [facility] called this afternoon and all his tests cleared and OK for transplant. He will keep [place] updated.

## 2012-10-06 ENCOUNTER — Telehealth: Payer: Self-pay | Admitting: *Deleted

## 2012-10-06 NOTE — Telephone Encounter (Signed)
Call from pt asking if the 11/18 appt is necessary? Pt is scheduled for catheter placement 11/20 and to begin Neupogen injections next week with plans to harvest stem cells 10/16/12. Per Dr. Truett Perna: will reschedule visit with Dr. Truett Perna as needed.

## 2012-10-09 ENCOUNTER — Ambulatory Visit: Payer: Medicare Other | Admitting: Oncology

## 2012-10-25 ENCOUNTER — Telehealth: Payer: Self-pay | Admitting: *Deleted

## 2012-10-25 NOTE — Telephone Encounter (Signed)
Pt called reporting that he was discharged from [facility] today after 3 days---was in for "clot in right arm"  "Dr. Vicente Serene wanted labs to be drawn here on Friday and should be sending information over."  Note to Dr. Truett Perna

## 2012-10-26 ENCOUNTER — Other Ambulatory Visit: Payer: Self-pay | Admitting: *Deleted

## 2012-10-26 ENCOUNTER — Ambulatory Visit (HOSPITAL_COMMUNITY)
Admission: RE | Admit: 2012-10-26 | Discharge: 2012-10-26 | Disposition: A | Discharge status: Home / Self Care | Payer: Medicare Other | Source: Ambulatory Visit | Attending: Nurse Practitioner | Admitting: Nurse Practitioner

## 2012-10-26 ENCOUNTER — Telehealth: Payer: Self-pay | Admitting: *Deleted

## 2012-10-26 ENCOUNTER — Ambulatory Visit (HOSPITAL_BASED_OUTPATIENT_CLINIC_OR_DEPARTMENT_OTHER): Payer: Medicare Other | Admitting: Nurse Practitioner

## 2012-10-26 ENCOUNTER — Ambulatory Visit (HOSPITAL_BASED_OUTPATIENT_CLINIC_OR_DEPARTMENT_OTHER): Payer: Medicare Other | Admitting: Lab

## 2012-10-26 ENCOUNTER — Telehealth: Payer: Self-pay | Admitting: Oncology

## 2012-10-26 VITALS — BP 131/68 | HR 98 | Temp 99.0°F | Resp 18 | Ht 73.0 in | Wt 188.5 lb

## 2012-10-26 DIAGNOSIS — C9000 Multiple myeloma not having achieved remission: Secondary | ICD-10-CM

## 2012-10-26 DIAGNOSIS — R05 Cough: Secondary | ICD-10-CM

## 2012-10-26 DIAGNOSIS — R509 Fever, unspecified: Secondary | ICD-10-CM | POA: Insufficient documentation

## 2012-10-26 DIAGNOSIS — E859 Amyloidosis, unspecified: Secondary | ICD-10-CM

## 2012-10-26 DIAGNOSIS — R059 Cough, unspecified: Secondary | ICD-10-CM | POA: Insufficient documentation

## 2012-10-26 DIAGNOSIS — J189 Pneumonia, unspecified organism: Secondary | ICD-10-CM

## 2012-10-26 DIAGNOSIS — J984 Other disorders of lung: Secondary | ICD-10-CM | POA: Insufficient documentation

## 2012-10-26 LAB — CBC WITH DIFFERENTIAL/PLATELET
BASO%: 0.2 % (ref 0.0–2.0)
Basophils Absolute: 0 10*3/uL (ref 0.0–0.1)
EOS%: 0.8 % (ref 0.0–7.0)
Eosinophils Absolute: 0.1 10*3/uL (ref 0.0–0.5)
HCT: 32.3 % — ABNORMAL LOW (ref 38.4–49.9)
HGB: 10.6 g/dL — ABNORMAL LOW (ref 13.0–17.1)
LYMPH%: 5.4 % — ABNORMAL LOW (ref 14.0–49.0)
MCH: 29.7 pg (ref 27.2–33.4)
MCHC: 33 g/dL (ref 32.0–36.0)
MCV: 90.2 fL (ref 79.3–98.0)
MONO#: 1.1 10*3/uL — ABNORMAL HIGH (ref 0.1–0.9)
MONO%: 9.5 % (ref 0.0–14.0)
NEUT#: 9.5 10*3/uL — ABNORMAL HIGH (ref 1.5–6.5)
NEUT%: 84.1 % — ABNORMAL HIGH (ref 39.0–75.0)
Platelets: 78 10*3/uL — ABNORMAL LOW (ref 140–400)
RBC: 3.58 10*6/uL — ABNORMAL LOW (ref 4.20–5.82)
RDW: 15.5 % — ABNORMAL HIGH (ref 11.0–14.6)
WBC: 11.2 10*3/uL — ABNORMAL HIGH (ref 4.0–10.3)
lymph#: 0.6 10*3/uL — ABNORMAL LOW (ref 0.9–3.3)

## 2012-10-26 MED ORDER — MOXIFLOXACIN HCL 400 MG PO TABS: 400.0000 mg | ORAL_TABLET | Freq: Every day | ORAL | Status: DC

## 2012-10-26 NOTE — Progress Notes (Signed)
OFFICE PROGRESS NOTE  Interval history:  [name] returns prior to scheduled followup. He recently underwent stem cell collection at [facility]. He went to [facility] on 10/21/2012. On 10/22/2012 his arm became "red and tight". He was evaluated at [facility] and was diagnosed with a right internal jugular clot He was subsequently transported to [facility] the ambulance. The DVT was felt to likely be related to be a pheresis catheter. He was started on a heparin infusion. The pheresis catheter was removed. He was transitioned to Lovenox and discharged on 10/25/2012. The discharge summary indicates that he was febrile on 10/23/2012 and had a negative workup. Specifically a urinalysis was negative, chest x-ray showed possible atelectasis versus developing pneumonia. A urine culture and blood culture showed no growth. Antibiotics were not initiated as the fever was felt to likely be related to the clot.  Mr. Vigo reports significant improvement in the right arm swelling. He continues Lovenox 80 mg twice daily. Over the past 24 hours he has had a low-grade fever ranging from 99.5-100.5. He denies shaking chills. He has been mildly short of breath since yesterday and has had an overall dry cough. He did expectorate a small amount of blood earlier this morning. He denies urinary symptoms.   Objective: Blood pressure 131/68, pulse 98, temperature 99 F (37.2 C), temperature source Oral, resp. rate 18, height 6\' 1"  (1.854 m), weight 188 lb 8 oz (85.503 kg).  Oropharynx is without thrush or ulceration. Rales at the right lung base. Regular cardiac rhythm. Abdomen soft and nontender. No organomegaly. Trace edema of the right arm. Ecchymosis at the right biceps region. Legs are without edema.  Lab Results: Lab Results  Component Value Date   WBC 11.2* 10/26/2012   HGB 10.6* 10/26/2012   HCT 32.3* 10/26/2012   MCV 90.2 10/26/2012   PLT 78* 10/26/2012    Chemistry:    Chemistry      Component Value  Date/Time   NA 138 08/01/2012 1030   K 3.7 08/01/2012 1030   CL 105 08/01/2012 1030   CO2 26 08/01/2012 1030   BUN 23 08/01/2012 1030   CREATININE 1.03 08/01/2012 1030      Component Value Date/Time   CALCIUM 8.9 08/01/2012 1030   ALKPHOS 70 03/31/2012 1821   AST 12 03/31/2012 1821   ALT 10 03/31/2012 1821   BILITOT 0.3 03/31/2012 1821       Studies/Results: No results found.  Medications: I have reviewed the patient's current medications.  Assessment/Plan:  1. 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 2. "Bruising" at the eyelids and mouth: Likely related to amyloidosis, persistent. 3. Numbness and loss of vibratory sense at the fingertips: This predated Velcade-based therapy, but worsened. Now much improved following cervical spine surgery. 4. Elevated serum free lambda light chains. The lambda light chains were lower on November 16 and slightly higher on 12/14/2011. 5. Lambda light chain proteinuria. 6. Bone marrow plasmacytosis: Variable increase in plasma cells noted on the bone marrow biopsy 07/29/2011 with plasma cells estimated to represent between 4% and 20% of the cellular population. 7. Remote history of prostate cancer. 8. Sleep apnea. 9. Dyslipidemia. 10. Report of pneumonia on 2 occasions in 2011. 11. Admission to a hospital in [place], [place] October 2012 with "pneumonia." A chest x-ray at [facility] on November 2 was negative .  12. Low serum immunoglobulin G level. 13. Borderline-low hemoglobin level, improved. 14. Plasma cell dyscrasia with associated amyloidosis.  a. Initiation of systemic therapy with Cytoxan,  Velcade, and Decadron 08/12/2011. Cycle #2 was initiated on 09/16/2011. Cycle #3 was initiated on 10/15/2011. Velcade was placed on hold due to neuropathy. b. The serum free lambda light chains were decreased on October 08 2011. c. The serum free lambda light chains were slightly increased on 12/14/2011, lower on 12/29/2011 and  02/02/2012. d. Initiation of Revlimid/Decadron February 2013, cycle 2 started on 01/28/2012. 15. Loss of "balance "and proximal motor weakness secondary to cervical stenosis-status post decompression surgery on 02/19/2012. Improved, but not resolved stenosis with mass effect on the spinal cord noted on a repeat MRI 03/28/2012. He underwent further decompression surgery on 04/06/2012. The ataxia, weakness, and peripheral numbness is much improved.  16. Right lower Extremity deep vein thrombosis 02/02/2012-a Doppler ultrasound confirmed a gastrocnemius and peroneal deep vein thrombosis. An IVC filter was placed prior to surgery and he was maintained on Lovenox. The IVC filter was removed on 05/04/2012. Now maintained off of anticoagulation  17. Anorexia following cervical spine surgery. Improved.  18. Episode of flank pain and hematuria in 03/31/2012-etiology unclear. He completed a course of antibiotics. The hematuria and pain have resolved.  19. Rectal wall thickening noted on a CT of the pelvis 03/31/2012.  20. Recent colonoscopy showed an area of abnormality at the rectum with biopsy positive for amyloid.  21. Lung nodule noted on the CT 03/31/2012-we will consider obtaining a followup CT in 3-6 months.  22. Lumbar stenosis-status post surgery on 07/31/2012.  23. Status post recent stem cell collection. 24. Recent right internal jugular clot likely related to pheresis catheter. Currently maintained on Lovenox. 25. Fever, cough, shortness of breath with abnormal lung exam.  Disposition-clinically he appears to have pneumonia. We are referring him for a chest x-ray. He will begin Avelox 400 mg daily for 10 days. He will return for a followup visit on 10/27/2012. He understands to seek emergency evaluation with any deterioration in his condition overnight.  Patient seen with Dr. Truett Perna.   Lonna Cobb ANP/GNP-BC

## 2012-10-26 NOTE — Telephone Encounter (Signed)
Sent pt to xray and pt has appt for 12/6-printed

## 2012-10-26 NOTE — Telephone Encounter (Signed)
Kim called on behalf of patient requesting he be seen by a physician extender today.  Patient was admitted to [facility] earlier this week.  Was found to have a large clot around central line.  Line was removed and he was started on anti-coagulation therapy.  Today he has a fever of 100.5.  Was to see Dr. Truett Perna tomorrow but with the fever, he needs to be seen today.  Reports having faxed discharge information thirty minutes ago.  Will notify providers.

## 2012-10-27 ENCOUNTER — Ambulatory Visit (HOSPITAL_COMMUNITY)
Admission: RE | Admit: 2012-10-27 | Discharge: 2012-10-27 | Disposition: A | Discharge status: Home / Self Care | Payer: Medicare Other | Source: Ambulatory Visit | Attending: Nurse Practitioner | Admitting: Nurse Practitioner

## 2012-10-27 ENCOUNTER — Ambulatory Visit: Payer: Medicare Other | Admitting: Oncology

## 2012-10-27 ENCOUNTER — Inpatient Hospital Stay (HOSPITAL_COMMUNITY)
Admission: AD | Admit: 2012-10-27 | Discharge: 2012-11-13 | DRG: 175 | Disposition: A | Discharge status: Home / Self Care | Payer: Medicare Other | Source: Ambulatory Visit | Attending: Oncology | Admitting: Oncology

## 2012-10-27 ENCOUNTER — Telehealth: Payer: Self-pay | Admitting: Oncology

## 2012-10-27 ENCOUNTER — Encounter (HOSPITAL_COMMUNITY): Payer: Self-pay | Admitting: *Deleted

## 2012-10-27 VITALS — BP 123/72 | HR 102 | Temp 100.3°F | Resp 18 | Ht 73.0 in | Wt 188.6 lb

## 2012-10-27 DIAGNOSIS — R509 Fever, unspecified: Secondary | ICD-10-CM | POA: Insufficient documentation

## 2012-10-27 DIAGNOSIS — I2699 Other pulmonary embolism without acute cor pulmonale: Principal | ICD-10-CM | POA: Diagnosis present

## 2012-10-27 DIAGNOSIS — E859 Amyloidosis, unspecified: Secondary | ICD-10-CM

## 2012-10-27 DIAGNOSIS — K55069 Acute infarction of intestine, part and extent unspecified: Secondary | ICD-10-CM

## 2012-10-27 DIAGNOSIS — D630 Anemia in neoplastic disease: Secondary | ICD-10-CM | POA: Diagnosis present

## 2012-10-27 DIAGNOSIS — R059 Cough, unspecified: Secondary | ICD-10-CM | POA: Insufficient documentation

## 2012-10-27 DIAGNOSIS — K55059 Acute (reversible) ischemia of intestine, part and extent unspecified: Secondary | ICD-10-CM | POA: Diagnosis not present

## 2012-10-27 DIAGNOSIS — R0602 Shortness of breath: Secondary | ICD-10-CM

## 2012-10-27 DIAGNOSIS — R05 Cough: Secondary | ICD-10-CM | POA: Insufficient documentation

## 2012-10-27 DIAGNOSIS — I82409 Acute embolism and thrombosis of unspecified deep veins of unspecified lower extremity: Secondary | ICD-10-CM

## 2012-10-27 DIAGNOSIS — C9000 Multiple myeloma not having achieved remission: Secondary | ICD-10-CM | POA: Diagnosis present

## 2012-10-27 DIAGNOSIS — R112 Nausea with vomiting, unspecified: Secondary | ICD-10-CM | POA: Diagnosis not present

## 2012-10-27 DIAGNOSIS — D75829 Heparin-induced thrombocytopenia, unspecified: Secondary | ICD-10-CM | POA: Diagnosis present

## 2012-10-27 DIAGNOSIS — K560 Paralytic ileus: Secondary | ICD-10-CM | POA: Diagnosis not present

## 2012-10-27 DIAGNOSIS — I81 Portal vein thrombosis: Secondary | ICD-10-CM | POA: Diagnosis present

## 2012-10-27 DIAGNOSIS — K567 Ileus, unspecified: Secondary | ICD-10-CM

## 2012-10-27 DIAGNOSIS — D7582 Heparin induced thrombocytopenia (HIT): Secondary | ICD-10-CM | POA: Diagnosis present

## 2012-10-27 DIAGNOSIS — N179 Acute kidney failure, unspecified: Secondary | ICD-10-CM | POA: Diagnosis not present

## 2012-10-27 DIAGNOSIS — J90 Pleural effusion, not elsewhere classified: Secondary | ICD-10-CM | POA: Insufficient documentation

## 2012-10-27 DIAGNOSIS — E8581 Light chain (AL) amyloidosis: Secondary | ICD-10-CM

## 2012-10-27 HISTORY — DX: Light chain (AL) amyloidosis: E85.81

## 2012-10-27 HISTORY — DX: Heparin induced thrombocytopenia (HIT): D75.82

## 2012-10-27 LAB — COMPREHENSIVE METABOLIC PANEL
ALT: 35 U/L (ref 0–53)
AST: 55 U/L — ABNORMAL HIGH (ref 0–37)
Albumin: 2.7 g/dL — ABNORMAL LOW (ref 3.5–5.2)
Alkaline Phosphatase: 117 U/L (ref 39–117)
BUN: 16 mg/dL (ref 6–23)
CO2: 26 mEq/L (ref 19–32)
Calcium: 8.6 mg/dL (ref 8.4–10.5)
Chloride: 101 mEq/L (ref 96–112)
Creatinine, Ser: 0.79 mg/dL (ref 0.50–1.35)
GFR calc Af Amer: >90 mL/min (ref >90)
GFR calc non Af Amer: >90 mL/min (ref >90)
Glucose, Bld: 109 mg/dL — ABNORMAL HIGH (ref 70–99)
Potassium: 3.6 mEq/L (ref 3.5–5.1)
Sodium: 139 mEq/L (ref 135–145)
Total Bilirubin: 0.6 mg/dL (ref 0.3–1.2)
Total Protein: 6 g/dL (ref 6.0–8.3)

## 2012-10-27 LAB — APTT: aPTT: 38 seconds — ABNORMAL HIGH (ref 24–37)

## 2012-10-27 LAB — CBC
HCT: 28.2 % — ABNORMAL LOW (ref 39.0–52.0)
Hemoglobin: 9.5 g/dL — ABNORMAL LOW (ref 13.0–17.0)
MCH: 29.1 pg (ref 26.0–34.0)
MCHC: 33.7 g/dL (ref 30.0–36.0)
MCV: 86.5 fL (ref 78.0–100.0)
Platelets: 116 10*3/uL — ABNORMAL LOW (ref 150–400)
RBC: 3.26 MIL/uL — ABNORMAL LOW (ref 4.22–5.81)
RDW: 14.8 % (ref 11.5–15.5)
WBC: 10.3 10*3/uL (ref 4.0–10.5)

## 2012-10-27 MED ORDER — HEPARIN BOLUS VIA INFUSION
3000.0000 [IU] | Freq: Once | INTRAVENOUS | Status: AC
  Administered 2012-10-27: 3000 [IU] via INTRAVENOUS
  Filled 2012-10-27: qty 3000

## 2012-10-27 MED ORDER — ACETAMINOPHEN 325 MG PO TABS: 650.0000 mg | ORAL_TABLET | Freq: Four times a day (QID) | ORAL | Status: DC | PRN

## 2012-10-27 MED ORDER — IOHEXOL 350 MG/ML SOLN
100.0000 mL | Freq: Once | INTRAVENOUS | Status: AC | PRN
  Administered 2012-10-27: 100 mL via INTRAVENOUS

## 2012-10-27 MED ORDER — OXYCODONE HCL 5 MG PO TABS
5.0000 mg | ORAL_TABLET | ORAL | Status: DC | PRN
  Administered 2012-10-28: 10 mg via ORAL
  Filled 2012-10-27: qty 2

## 2012-10-27 MED ORDER — ALUM & MAG HYDROXIDE-SIMETH 200-200-20 MG/5ML PO SUSP
30.0000 mL | Freq: Four times a day (QID) | ORAL | Status: DC | PRN
  Administered 2012-11-03 – 2012-11-10 (×3): 30 mL via ORAL
  Filled 2012-10-27 (×4): qty 30

## 2012-10-27 MED ORDER — SENNA 8.6 MG PO TABS
1.0000 | ORAL_TABLET | Freq: Two times a day (BID) | ORAL | Status: DC
  Administered 2012-10-28 – 2012-10-29 (×3): 8.6 mg via ORAL
  Filled 2012-10-27 (×4): qty 1

## 2012-10-27 MED ORDER — HEPARIN (PORCINE) IN NACL 100-0.45 UNIT/ML-% IJ SOLN
1300.0000 [IU]/h | INTRAMUSCULAR | Status: DC
  Administered 2012-10-27: 1300 [IU]/h via INTRAVENOUS
  Filled 2012-10-27: qty 250

## 2012-10-27 NOTE — Progress Notes (Signed)
ANTICOAGULATION CONSULT NOTE - Initial Consult  Pharmacy Consult for IV heparin Indication: pulmonary embolus  No Known Allergies  Patient Measurements: Height: 6' 0.83" (185 cm) (as recorded earlier 12/6) Weight: 188 lb 7.9 oz (85.5 kg) IBW/kg (Calculated) : 79.52  Heparin Dosing Weight:   Vital Signs: Temp: 100.3 F (37.9 C) (12/06 1206) Temp src: Oral (12/06 1206) BP: 123/72 mmHg (12/06 1206) Pulse Rate: 102  (12/06 1206)  Labs:  Basename 10/26/12 1456  HGB 10.6*  HCT 32.3*  PLT 78*  APTT --  LABPROT --  INR --  HEPARINUNFRC --  CREATININE --  CKTOTAL --  CKMB --  TROPONINI --    Estimated Creatinine Clearance: 78.3 ml/min (by C-G formula based on Cr of 1.03).   Medical History: Past Medical History  Diagnosis Date  . Dyslipidemia   . H/O multiple myeloma   . Spastic quadriparesis   . DVT (deep venous thrombosis)     right  . Pneumonia     hx of with last time in Nov 2012  . Joint pain   . Sleep apnea     CPAP- q night, [place] March- 2 yrs.   . Prostate cancer     amyloidosis, multiple myeloma   . Arthritis     lumbar DDD  . Peripheral vascular disease     DVT- 01/2012, follows by Dr. Truett Perna, lovenox maintained since Spring 2013 , pt. aware that last dose is sch. for 07/22/2012  . Blood dyscrasia     plasma cell dyscrasia with associated amyloidosis    Assessment:  40 yom with h/o plasma cell dyscrasia, newly diagnosed R internal jugular clot 10/22/12 felt to be 2/2 pheresis catheter for which patient was on Lovenox 80 mg q12h.  Pt presented to onc office 12/5 for persistent low-grade fever, started on Avelox, returned 12/6 for follow up and CT angio revealed RLL PE with moderate clot burden. Admit for IV heparin. Last dose of Lovenox 12/6 (last taken at 0800 per patient).  Plan transitioning back to therapeutic Lovenox if condition improves in the next several days.  Platelets 78 K (per oncology note, thrombocytopenia felt likely 2/2 to recent  pheresis procedure and pt had a normal plts count prior to pheresis) - will monitor closely with initiation of IV heparin.  No Scr available to assess renal function.   Goal of Therapy:  Heparin level 0.3-0.7 units/ml Monitor platelets by anticoagulation protocol: Yes   Plan:   Bolus with 3000 units of IV heparin then continue with 1300 units/hr.    Heparin level 8 hours s/p initiation of dose  Daily heparin level and CBC  Pharmacy will f/u  Geoffry Paradise, PharmD, BCPS Pager: [phone] 5:52 PM Pharmacy #: [id number]

## 2012-10-27 NOTE — Telephone Encounter (Signed)
Called central scheduling and got appt for 3:00pm today..the patient going to come back to office to see Dr. Truett Perna

## 2012-10-27 NOTE — Progress Notes (Signed)
Admission History and Physical      Chief Complaint: Fever, cough, shortness of breath. HPI: Brent Allison is a 66 year old man with a plasma cell dyscrasia with associated amyloidosis. He has been treated in the past with Cytoxan/Velcade/Decadron and most Revlimid/Decadron with the last cycle beginning on 01/28/2012. He underwent stem cell collection at [facility] on 10/17/2012 and 10/18/2012. He went to [facility] on 10/21/2012. On 10/22/2012 his right arm became "red and tight". He was evaluated at [facility] and diagnosed with a right internal jugular clot. He was subsequently transported to [facility] via ambulance. The DVT was felt to likely be related to the pheresis catheter. He was started on a heparin infusion. The pheresis catheter was removed. He was transitioned to Lovenox. He was discharged home on 10/25/2012. The discharge summary indicated that he was febrile 10/23/2012 and had a negative workup including a negative urinalysis, negative urine culture and blood culture. A chest x-ray showed possible atelectasis versus developing pneumonia. The fever was felt to likely be related to the clot.  He was seen in our office on 10/26/2012 due to a persistent low-grade fever. At that time he reported mild shortness of breath over the past 24 hours and a dry cough. He had expectorated a small amount of blood early on 10/26/2012. He was referred for a chest x-ray which was essentially negative. He was started on a course of Avelox.  He returned to the office today for followup. He continues to have a frequent cough. He has expectorated blood on 2 or 3 occasions. No other bleeding. He continues to feel short of breath. He had no fever overnight. No shaking chills. He complains of feeling "sore" when laying on the right side. He also reports pleuritic right-sided chest pain. No leg swelling or calf pain. The arm edema continues to be improved.    Past Medical History  Diagnosis Date  . Dyslipidemia    . H/O multiple myeloma   . Spastic quadriparesis   . DVT (deep venous thrombosis)     right  . Pneumonia     hx of with last time in Nov 2012  . Joint pain   . Sleep apnea     CPAP- q night, [place] March- 2 yrs.   . Prostate cancer     amyloidosis, multiple myeloma   . Arthritis     lumbar DDD  . Peripheral vascular disease     DVT- 01/2012, follows by Dr. Truett Perna, lovenox maintained since Spring 2013 , pt. aware that last dose is sch. for 07/22/2012  . Blood dyscrasia     plasma cell dyscrasia with associated amyloidosis    Past Surgical History  Procedure Date  . Prostatectomy around 2005  . Posterior laminectomy / decompression lumbar spine around 2007    L4-5  . Carpal tunnel release 2009    left  . Back surgery   . Anterior cervical decomp/discectomy fusion 02/26/2012    Procedure: ANTERIOR CERVICAL DECOMPRESSION/DISCECTOMY FUSION 2 LEVELS;  Surgeon: Hewitt Shorts, MD;  Location: [facility];  Service: Neurosurgery;  Laterality: N/A;  C3-4 C4-5 Anterior cervical decompression/diskectomy, fusion  . Tonsillectomy     as a child  . Hernia repair     around 1985  . Cardiac catheterization 2010  . Colonoscopy   . Lipoma excision around 1985    removed from one of his shoulders  . Posterior cervical fusion/foraminotomy 04/06/2012    Procedure: POSTERIOR CERVICAL FUSION/FORAMINOTOMY LEVEL 3;  Surgeon: Belia Heman  Newell Coral, MD;  Location: [facility]UR[facility];  Service: Neurosurgery;  Laterality: N/A;  C3 and C4 laminectomy with C23 C34 C45 posterior cervical arthrodesis with instrumentation  . Ivt     IVC filter placed d/t blood clots, removed- July- 2013    Current medications: Lovenox 80 mg every 12 hours. Avelox 400 mg daily.  No Known Allergies  Family History  Problem Relation Age of Onset  . Anesthesia problems Neg Hx   . Hypotension Neg Hx   . Malignant hyperthermia Neg Hx   . Pseudochol deficiency Neg Hx      reports that he quit smoking about 33 years ago. He  does not have any smokeless tobacco history on file. He reports that he drinks about .6 ounces of alcohol per week. He reports that he does not use illicit drugs.  ROS: Per history of present illness.  Physical: Temperature 100.3, heart rate 102, respirations 18, blood pressure 123/72, oxygen saturation 98% on room air.   General: Somewhat ill-appearing male in no acute distress. HEENT: Right periorbital ecchymosis. Chest: Inspiratory rales at bilateral lung bases right greater than left. Cardiovascular: Regular cardiac rhythm. Abdomen: Soft and nontender. No organomegaly. Extremities: Without edema. Neuro: Alert and oriented. Follows commands. Ambulating without difficulty.  Labs:  Results for orders placed in visit on 10/26/12 (from the past 48 hour(s))  CBC WITH DIFFERENTIAL     Status: Abnormal   Collection Time   10/26/12  2:56 PM      Component Value Range Comment   WBC 11.2 (*) 4.0 - 10.3 10e3/uL    NEUT# 9.5 (*) 1.5 - 6.5 10e3/uL    HGB 10.6 (*) 13.0 - 17.1 g/dL    HCT 40.9 (*) 81.1 - 49.9 %    Platelets 78 (*) 140 - 400 10e3/uL    MCV 90.2  79.3 - 98.0 fL    MCH 29.7  27.2 - 33.4 pg    MCHC 33.0  32.0 - 36.0 g/dL    RBC 9.14 (*) 7.82 - 5.82 10e6/uL    RDW 15.5 (*) 11.0 - 14.6 %    lymph# 0.6 (*) 0.9 - 3.3 10e3/uL    MONO# 1.1 (*) 0.1 - 0.9 10e3/uL    Eosinophils Absolute 0.1  0.0 - 0.5 10e3/uL    Basophils Absolute 0.0  0.0 - 0.1 10e3/uL    NEUT% 84.1 (*) 39.0 - 75.0 %    LYMPH% 5.4 (*) 14.0 - 49.0 %    MONO% 9.5  0.0 - 14.0 %    EOS% 0.8  0.0 - 7.0 %    BASO% 0.2  0.0 - 2.0 %    Dg Chest 2 View  10/26/2012  *RADIOLOGY REPORT*  Clinical Data: Cough and fever; history of amyloidosis  CHEST - 2 VIEW  Comparison: None.  Findings:  There is no edema or consolidation. There is mild bibasilar lung scarring.  The heart size and pulmonary vascularity are normal.  No adenopathy.  There is degenerative change in the thoracic spine. There is postoperative change in the  cervical spine.  IMPRESSION: Basilar scarring with costophrenic angle thickening.  No edema or consolidation.  No appreciable adenopathy.   Original Report Authenticated By: Bretta Bang, M.D.    Ct Angio Chest Pe W/cm &/or Wo Cm  10/27/2012  *RADIOLOGY REPORT*  Clinical Data: Amyloidosis, now with fever and cough, history of right IJ blood clot, evaluate for pulmonary embolism  CT ANGIOGRAPHY CHEST  Technique:  Multidetector CT imaging of the chest using  the standard protocol during bolus administration of intravenous contrast. Multiplanar reconstructed images including MIPs were obtained and reviewed to evaluate the vascular anatomy.  Contrast: OMNIPAQUE IOHEXOL 350 MG/ML SOLN  Comparison: None.  Vascular Findings:  There is adequate opacification of the pulmonary arteries with the main pulmonary artery measuring 243 HU.  There is near occlusive pulmonary embolism within the right lower lobe pulmonary artery extending into all segmental branches.  These findings are associated with right basilar heterogeneous opacities which in this setting is worrisome for developing pulmonary infarct.  No additional filling defects are seen within the pulmonary arterial tree, though note, evaluation of the distal subsegmental vessels within the left lower lobe are degraded secondary to patient respiratory artifact.  Normal-caliber the main pulmonary artery.  No CT evidence of right-sided heart strain. There is no reflux of injected intravenous contrast into the hepatic venous system.  Normal heart size.  No pericardial effusion.  Scattered atherosclerotic plaque with a normal caliber thoracic aorta. Conventional configuration of the aortic arch.  The left vertebral artery is incidentally noted to arise directly from the aortic arch.  Note, evaluation of the right internal jugular vein was not evaluated on this examination secondary to left upper extremity intravenous injection.   ------------------------------------------------  Nonvascular findings:  Evaluation of the pulmonary parenchyma, in particular with the bilateral lung bases is degraded secondary to patient respiratory artifact.  Small right and trace left-sided pleural effusions. Right basilar heterogeneous air space opacities are worrisome for infection.  Left basilar linear heterogeneous opacities are favored to represent subsegmental atelectasis.  The central pulmonary airways are patent.  No pneumothorax.  Shoddy precarinal lymph node measures approximately 1 cm in greatest short axis diameter however maintains a benign fatty hilum (image 42, series four).  Additional scattered shoddy mediastinal lymph nodes are not enlarged by CT criteria.  No definite mediastinal, hilar or axillary lymphadenopathy.  Limited early arterial phase evaluation of the upper abdomen is unremarkable.  Heterogeneous appearance of the thyroid without discrete nodule.  No acute or aggressive osseous abnormalities.  DISH is suspected within the thoracic spine.  IMPRESSION:  1.  Pulmonary embolism within the right lower lobe pulmonary artery and its segmental branches.  Overall clot burden is moderate.  No CT evidence of right-sided heart strain. 2.  Heterogeneous opacities within the right lower lobe are favored to represent evolving pulmonary infarction.  3.  Small right-sided pleural effusion, likely reactive.  Above findings discussed with Dr. Truett Perna at [phone].   Original Report Authenticated By: Tacey Ruiz, MD     Assessment/Plan 1. Pulmonary embolism right lower lobe. 2. Recent right internal jugular clot felt to be secondary to the pheresis catheter. Hospitalized at [facility] 10/23/2012 through 10/25/2012 initially treated with heparin and transitioned to twice daily Lovenox. 3. Status post removal of pheresis catheter at [facility]. 4. Thrombocytopenia felt to be secondary to the pheresis procedure. 5. Status post stem cell collection 10/17/2012 and  10/18/2012. 6. History of right lower extremity deep vein thrombosis 02/02/2012. Plasma cell dyscrasia with associated amyloidosis treated in the past with Cytoxan/Velcade/Decadron and Revlimid/Decadron. He is scheduled to undergo high-dose chemotherapy with stem cell support later this month.   Disposition-Brent Allison has an acute right side pulmonary embolism likely related to the recent right internal jugular clot. We are admitting him to the hospital for initiation of IV heparin.  The thrombocytopenia is felt to likely be secondary to the recent pheresis procedure. He had a normal platelet count prior to pheresis. Dr.  Keierra Nudo spoke with Dr. Vicente Serene at [facility] and confirmed the likelihood that the fall in the platelet count is most likely related to the pheresis procedure. We will continue to monitor the platelet count closely.   Patient interviewed and examined by Dr. Truett Perna; plan per Dr. Truett Perna.  Lonna Cobb ANP/GNP-BC 10/27/2012, 4:46 PM   I interviewed and examined Brent Allison. I agree with the note above. He appears to have a pheresis catheter related DVT/pulmonary embolism. The platelet count appears to have fallen immediately following initiation of leukapheresis therapy at [facility]. I have a low clinical suspicion for heparin related thrombocytopenia. The platelet count has improved while on Lovenox. I discussed the case with Dr. Vicente Serene at [facility].  He will be admitted for intravenous heparin anticoagulation with the plan to convert to therapeutic Lovenox if his condition improves over the next several days.

## 2012-10-27 NOTE — Progress Notes (Signed)
OFFICE PROGRESS NOTE  Interval history:  [name] returns as scheduled. The chest x-ray done yesterday showed mild bibasilar lung scarring. There was no edema or consolidation and no appreciable adenopathy. He began Avelox as prescribed last night.  He continues to have a frequent cough. He saturated blood on 2 or 3 occasions. He continues to feel short of breath. He had no fever overnight. He denies shaking chills.   Objective: Blood pressure 123/72, pulse 102, temperature 100.3 F (37.9 C), temperature source Oral, resp. rate 18, height 6\' 1"  (1.854 m), weight 188 lb 9.6 oz (85.548 kg).  Rales at both lung bases right greater than left. Regular cardiac rhythm. Abdomen is soft and nontender. No organomegaly. Extremities are without edema. Erythematous macular/papular rash scattered over the back.  Lab Results: Lab Results  Component Value Date   WBC 11.2* 10/26/2012   HGB 10.6* 10/26/2012   HCT 32.3* 10/26/2012   MCV 90.2 10/26/2012   PLT 78* 10/26/2012    Chemistry:    Chemistry      Component Value Date/Time   NA 138 08/01/2012 1030   K 3.7 08/01/2012 1030   CL 105 08/01/2012 1030   CO2 26 08/01/2012 1030   BUN 23 08/01/2012 1030   CREATININE 1.03 08/01/2012 1030      Component Value Date/Time   CALCIUM 8.9 08/01/2012 1030   ALKPHOS 70 03/31/2012 1821   AST 12 03/31/2012 1821   ALT 10 03/31/2012 1821   BILITOT 0.3 03/31/2012 1821       Studies/Results: Dg Chest 2 View  10/26/2012  *RADIOLOGY REPORT*  Clinical Data: Cough and fever; history of amyloidosis  CHEST - 2 VIEW  Comparison: None.  Findings:  There is no edema or consolidation. There is mild bibasilar lung scarring.  The heart size and pulmonary vascularity are normal.  No adenopathy.  There is degenerative change in the thoracic spine. There is postoperative change in the cervical spine.  IMPRESSION: Basilar scarring with costophrenic angle thickening.  No edema or consolidation.  No appreciable adenopathy.   Original  Report Authenticated By: Bretta Bang, M.D.     Medications: I have reviewed the patient's current medications.  Assessment/Plan:  1. 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 2. "Bruising" at the eyelids and mouth: Likely related to amyloidosis, persistent. 3. Numbness and loss of vibratory sense at the fingertips: This predated Velcade-based therapy, but worsened. Now much improved following cervical spine surgery. 4. Elevated serum free lambda light chains. The lambda light chains were lower on November 16 and slightly higher on 12/14/2011. 5. Lambda light chain proteinuria. 6. Bone marrow plasmacytosis: Variable increase in plasma cells noted on the bone marrow biopsy 07/29/2011 with plasma cells estimated to represent between 4% and 20% of the cellular population. 7. Remote history of prostate cancer. 8. Sleep apnea. 9. Dyslipidemia. 10. Report of pneumonia on 2 occasions in 2011. 11. Admission to a hospital in [place], [place] October 2012 with "pneumonia." A chest x-ray at [facility] on November 2 was negative .  12. Low serum immunoglobulin G level. 13. Borderline-low hemoglobin level, improved. 14. Plasma cell dyscrasia with associated amyloidosis.  a. Initiation of systemic therapy with Cytoxan, Velcade, and Decadron 08/12/2011. Cycle #2 was initiated on 09/16/2011. Cycle #3 was initiated on 10/15/2011. Velcade was placed on hold due to neuropathy. b. The serum free lambda light chains were decreased on October 08 2011. c. The serum free lambda light chains were slightly increased on 12/14/2011, lower on 12/29/2011 and 02/02/2012. d.  Initiation of Revlimid/Decadron February 2013, cycle 2 started on 01/28/2012. 15. Loss of "balance "and proximal motor weakness secondary to cervical stenosis-status post decompression surgery on 02/19/2012. Improved, but not resolved stenosis with mass effect on the spinal cord noted on a repeat MRI 03/28/2012. He underwent further  decompression surgery on 04/06/2012. The ataxia, weakness, and peripheral numbness is much improved.  16. Right lower Extremity deep vein thrombosis 02/02/2012-a Doppler ultrasound confirmed a gastrocnemius and peroneal deep vein thrombosis. An IVC filter was placed prior to surgery and he was maintained on Lovenox. The IVC filter was removed on 05/04/2012. Now maintained off of anticoagulation  17. Anorexia following cervical spine surgery. Improved.  18. Episode of flank pain and hematuria in 03/31/2012-etiology unclear. He completed a course of antibiotics. The hematuria and pain have resolved.  19. Rectal wall thickening noted on a CT of the pelvis 03/31/2012.  20. Recent colonoscopy showed an area of abnormality at the rectum with biopsy positive for amyloid.  21. Lung nodule noted on the CT 03/31/2012-we will consider obtaining a followup CT in 3-6 months.  22. Lumbar stenosis-status post surgery on 07/31/2012.  23. Status post recent stem cell collection.  24. Recent right internal jugular clot likely related to pheresis catheter. Currently maintained on Lovenox.  25. Fever, cough, shortness of breath with abnormal lung exam 10/26/2012. Chest x-ray did not show evidence of pneumonia. He was started on Avelox empirically. 26. Several occurrences of mild hemoptysis.  Disposition-the symptoms persist. We are referring him for a stat chest CT to evaluate for pneumonia, pulmonary embolus. We will see him back following the CT scan.   Lonna Cobb ANP/GNP-BC    Addendum-the CT scan showed a right-sided pulmonary embolism. He is being admitted to the hospital for initiation of IV heparin.

## 2012-10-28 ENCOUNTER — Inpatient Hospital Stay (HOSPITAL_COMMUNITY): Payer: Medicare Other

## 2012-10-28 DIAGNOSIS — D696 Thrombocytopenia, unspecified: Secondary | ICD-10-CM

## 2012-10-28 DIAGNOSIS — R042 Hemoptysis: Secondary | ICD-10-CM

## 2012-10-28 DIAGNOSIS — I2699 Other pulmonary embolism without acute cor pulmonale: Secondary | ICD-10-CM

## 2012-10-28 DIAGNOSIS — I82C19 Acute embolism and thrombosis of unspecified internal jugular vein: Secondary | ICD-10-CM

## 2012-10-28 LAB — CBC
HCT: 25.6 % — ABNORMAL LOW (ref 39.0–52.0)
HCT: 31.5 % — ABNORMAL LOW (ref 39.0–52.0)
Hemoglobin: 8.6 g/dL — ABNORMAL LOW (ref 13.0–17.0)
Hemoglobin: 10.4 g/dL — ABNORMAL LOW (ref 13.0–17.0)
MCH: 29.1 pg (ref 26.0–34.0)
MCH: 28.8 pg (ref 26.0–34.0)
MCHC: 33.6 g/dL (ref 30.0–36.0)
MCHC: 33.3 g/dL (ref 30.0–36.0)
MCV: 86.5 fL (ref 78.0–100.0)
MCV: 86.5 fL (ref 78.0–100.0)
Platelets: 106 10*3/uL — ABNORMAL LOW (ref 150–400)
Platelets: 91 10*3/uL — ABNORMAL LOW (ref 150–400)
RBC: 2.96 MIL/uL — ABNORMAL LOW (ref 4.22–5.81)
RBC: 3.64 MIL/uL — ABNORMAL LOW (ref 4.22–5.81)
RDW: 14.9 % (ref 11.5–15.5)
RDW: 14.9 % (ref 11.5–15.5)
WBC: 8 10*3/uL (ref 4.0–10.5)
WBC: 12.9 10*3/uL — ABNORMAL HIGH (ref 4.0–10.5)

## 2012-10-28 LAB — COMPREHENSIVE METABOLIC PANEL
ALT: 44 U/L (ref 0–53)
AST: 41 U/L — ABNORMAL HIGH (ref 0–37)
Albumin: 2.5 g/dL — ABNORMAL LOW (ref 3.5–5.2)
Alkaline Phosphatase: 131 U/L — ABNORMAL HIGH (ref 39–117)
BUN: 18 mg/dL (ref 6–23)
CO2: 25 mEq/L (ref 19–32)
Calcium: 8.4 mg/dL (ref 8.4–10.5)
Chloride: 100 mEq/L (ref 96–112)
Creatinine, Ser: 0.86 mg/dL (ref 0.50–1.35)
GFR calc Af Amer: >90 mL/min (ref >90)
GFR calc non Af Amer: 88 mL/min — ABNORMAL LOW (ref >90)
Glucose, Bld: 171 mg/dL — ABNORMAL HIGH (ref 70–99)
Potassium: 3.6 mEq/L (ref 3.5–5.1)
Sodium: 136 mEq/L (ref 135–145)
Total Bilirubin: 0.6 mg/dL (ref 0.3–1.2)
Total Protein: 5.8 g/dL — ABNORMAL LOW (ref 6.0–8.3)

## 2012-10-28 LAB — SAMPLE TO BLOOD BANK

## 2012-10-28 LAB — HEPARIN LEVEL (UNFRACTIONATED)
Heparin Unfractionated: 0.3 IU/mL (ref 0.30–0.70)
Heparin Unfractionated: 0.26 IU/mL — ABNORMAL LOW (ref 0.30–0.70)
Heparin Unfractionated: 0.24 IU/mL — ABNORMAL LOW (ref 0.30–0.70)
Heparin Unfractionated: 0.23 IU/mL — ABNORMAL LOW (ref 0.30–0.70)

## 2012-10-28 LAB — LIPASE, BLOOD: Lipase: 10 U/L — ABNORMAL LOW (ref 11–59)

## 2012-10-28 LAB — AMYLASE: Amylase: 26 U/L (ref 0–105)

## 2012-10-28 MED ORDER — MORPHINE SULFATE 2 MG/ML IJ SOLN
2.0000 mg | INTRAMUSCULAR | Status: DC | PRN
Start: 2012-10-28 — End: 2012-10-28

## 2012-10-28 MED ORDER — MORPHINE SULFATE 2 MG/ML IJ SOLN
2.0000 mg | INTRAMUSCULAR | Status: DC | PRN
  Administered 2012-10-28: 2 mg via INTRAVENOUS
  Filled 2012-10-28: qty 1

## 2012-10-28 MED ORDER — PANTOPRAZOLE SODIUM 40 MG IV SOLR
40.0000 mg | Freq: Once | INTRAVENOUS | Status: AC
Start: 2012-10-28 — End: 2012-10-28
  Administered 2012-10-28: 40 mg via INTRAVENOUS
  Filled 2012-10-28: qty 40

## 2012-10-28 MED ORDER — PROMETHAZINE HCL 25 MG/ML IJ SOLN
12.5000 mg | Freq: Four times a day (QID) | INTRAMUSCULAR | Status: DC | PRN
  Administered 2012-10-28: 12.5 mg via INTRAVENOUS
  Filled 2012-10-28: qty 1

## 2012-10-28 MED ORDER — ONDANSETRON 8 MG/NS 50 ML IVPB
8.0000 mg | Freq: Three times a day (TID) | INTRAVENOUS | Status: DC | PRN
  Administered 2012-10-28: 8 mg via INTRAVENOUS
  Filled 2012-10-28 (×2): qty 8

## 2012-10-28 MED ORDER — HEPARIN (PORCINE) IN NACL 100-0.45 UNIT/ML-% IJ SOLN
1400.0000 [IU]/h | INTRAMUSCULAR | Status: DC
  Filled 2012-10-28 (×2): qty 250

## 2012-10-28 MED ORDER — HEPARIN (PORCINE) IN NACL 100-0.45 UNIT/ML-% IJ SOLN
1800.0000 [IU]/h | INTRAMUSCULAR | Status: DC
  Filled 2012-10-28 (×2): qty 250

## 2012-10-28 MED ORDER — PANTOPRAZOLE SODIUM 40 MG IV SOLR
40.0000 mg | Freq: Every morning | INTRAVENOUS | Status: DC
  Administered 2012-10-29 – 2012-11-07 (×10): 40 mg via INTRAVENOUS
  Filled 2012-10-28 (×10): qty 40

## 2012-10-28 MED ORDER — PROMETHAZINE HCL 25 MG/ML IJ SOLN
12.5000 mg | INTRAMUSCULAR | Status: DC | PRN
  Administered 2012-10-29 – 2012-11-06 (×9): 12.5 mg via INTRAVENOUS
  Filled 2012-10-28 (×9): qty 1

## 2012-10-28 MED ORDER — HEPARIN BOLUS VIA INFUSION
1500.0000 [IU] | Freq: Once | INTRAVENOUS | Status: AC
  Administered 2012-10-28: 1500 [IU] via INTRAVENOUS
  Filled 2012-10-28: qty 1500

## 2012-10-28 MED ORDER — MORPHINE SULFATE 2 MG/ML IJ SOLN
2.0000 mg | INTRAMUSCULAR | Status: DC | PRN
  Administered 2012-10-28: 4 mg via INTRAVENOUS
  Filled 2012-10-28: qty 2

## 2012-10-28 MED ORDER — SODIUM CHLORIDE 0.9 % IV SOLN
INTRAVENOUS | Status: DC
  Administered 2012-10-29 (×2): via INTRAVENOUS

## 2012-10-28 MED ORDER — HEPARIN (PORCINE) IN NACL 100-0.45 UNIT/ML-% IJ SOLN
1600.0000 [IU]/h | INTRAMUSCULAR | Status: DC
  Administered 2012-10-28: 1600 [IU]/h via INTRAVENOUS
  Filled 2012-10-28: qty 250

## 2012-10-28 MED ORDER — ONDANSETRON HCL 4 MG/2ML IJ SOLN: 8.0000 mg | Freq: Three times a day (TID) | INTRAMUSCULAR | Status: DC | PRN

## 2012-10-28 NOTE — Progress Notes (Signed)
IP PROGRESS NOTE  Subjective:   He feels better. Small amount of hemoptysis this morning.  Objective: Vital signs in last 24 hours: Blood pressure 113/61, pulse 91, temperature 98.8 F (37.1 C), temperature source Oral, resp. rate 20, height 6' 0.84" (1.85 m), weight 188 lb 7.9 oz (85.5 kg), SpO2 99.00%.  Intake/Output from previous day: 12/06 0701 - 12/07 0700 In: 166.5 [I.V.:166.5] Out: 325 [Urine:325]  Physical Exam: Lungs: Decreased breath sounds with inspiratory rhonchi at the right base, no respiratory distress Cardiac: Regular rate and rhythm, tachycardia Abdomen: Nontender, no hepatosplenomegaly Extremities: No leg edema   Lab Results:  [facility] 10/28/12 0138 10/27/12 1825  WBC 8.0 10.3  HGB 8.6* 9.5*  HCT 25.6* 28.2*  PLT 106* 116*    BMET  Basename 10/27/12 1825  NA 139  K 3.6  CL 101  CO2 26  GLUCOSE 109*  BUN 16  CREATININE 0.79  CALCIUM 8.6   Heparin level 0.26 Studies/Results: Dg Chest 2 View  10/26/2012  *RADIOLOGY REPORT*  Clinical Data: Cough and fever; history of amyloidosis  CHEST - 2 VIEW  Comparison: None.  Findings:  There is no edema or consolidation. There is mild bibasilar lung scarring.  The heart size and pulmonary vascularity are normal.  No adenopathy.  There is degenerative change in the thoracic spine. There is postoperative change in the cervical spine.  IMPRESSION: Basilar scarring with costophrenic angle thickening.  No edema or consolidation.  No appreciable adenopathy.   Original Report Authenticated By: Bretta Bang, M.D.    Ct Angio Chest Pe W/cm &/or Wo Cm  10/27/2012  *RADIOLOGY REPORT*  Clinical Data: Amyloidosis, now with fever and cough, history of right IJ blood clot, evaluate for pulmonary embolism  CT ANGIOGRAPHY CHEST  Technique:  Multidetector CT imaging of the chest using the standard protocol during bolus administration of intravenous contrast. Multiplanar reconstructed images including MIPs were obtained and  reviewed to evaluate the vascular anatomy.  Contrast: OMNIPAQUE IOHEXOL 350 MG/ML SOLN  Comparison: None.  Vascular Findings:  There is adequate opacification of the pulmonary arteries with the main pulmonary artery measuring 243 HU.  There is near occlusive pulmonary embolism within the right lower lobe pulmonary artery extending into all segmental branches.  These findings are associated with right basilar heterogeneous opacities which in this setting is worrisome for developing pulmonary infarct.  No additional filling defects are seen within the pulmonary arterial tree, though note, evaluation of the distal subsegmental vessels within the left lower lobe are degraded secondary to patient respiratory artifact.  Normal-caliber the main pulmonary artery.  No CT evidence of right-sided heart strain. There is no reflux of injected intravenous contrast into the hepatic venous system.  Normal heart size.  No pericardial effusion.  Scattered atherosclerotic plaque with a normal caliber thoracic aorta. Conventional configuration of the aortic arch.  The left vertebral artery is incidentally noted to arise directly from the aortic arch.  Note, evaluation of the right internal jugular vein was not evaluated on this examination secondary to left upper extremity intravenous injection.  ------------------------------------------------  Nonvascular findings:  Evaluation of the pulmonary parenchyma, in particular with the bilateral lung bases is degraded secondary to patient respiratory artifact.  Small right and trace left-sided pleural effusions. Right basilar heterogeneous air space opacities are worrisome for infection.  Left basilar linear heterogeneous opacities are favored to represent subsegmental atelectasis.  The central pulmonary airways are patent.  No pneumothorax.  Shoddy precarinal lymph node measures approximately 1 cm in greatest  short axis diameter however maintains a benign fatty hilum (image 42, series  four).  Additional scattered shoddy mediastinal lymph nodes are not enlarged by CT criteria.  No definite mediastinal, hilar or axillary lymphadenopathy.  Limited early arterial phase evaluation of the upper abdomen is unremarkable.  Heterogeneous appearance of the thyroid without discrete nodule.  No acute or aggressive osseous abnormalities.  DISH is suspected within the thoracic spine.  IMPRESSION:  1.  Pulmonary embolism within the right lower lobe pulmonary artery and its segmental branches.  Overall clot burden is moderate.  No CT evidence of right-sided heart strain. 2.  Heterogeneous opacities within the right lower lobe are favored to represent evolving pulmonary infarction.  3.  Small right-sided pleural effusion, likely reactive.  Above findings discussed with Dr. Truett Perna at [phone].   Original Report Authenticated By: Tacey Ruiz, MD     Medications: I have reviewed the patient's current medications.  Assessment/Plan:  1. Acute pulmonary embolism-continue heparin anticoagulation, increased heparin dose per pharmacy 2. Right neck DVT secondary to a pheresis catheter-hospitalized at [facility] from 10/23/2012 through 10/25/2012 and treated with heparin/Lovenox  3. Thrombocytopenia-improved, likely related to the stem cell harvest procedure. Low clinical suspicion for heparin or Lovenox related thrombocytopenia 4. Right lower showed a deep vein thrombosis 02/02/2012 5. Plasma cell dyscrasia/amyloidosis-scheduled for high-dose chemotherapy and stem cell support later this month  He appears more comfortable this morning. The plan is to continue intravenous heparin anticoagulation. He will be transitioned to Lovenox at discharge. Dopplers of the legs are pending.    LOS: 1 day   Yusra Ravert  10/28/2012, 9:32 AM

## 2012-10-28 NOTE — Progress Notes (Signed)
IP PROGRESS NOTE  Subjective:   He developed nausea and vomiting beginning this afternoon. He now has diffuse abdominal pain. He had a small bowel movement today. The cough and chest discomfort are improved. The pain was not relieved with oxycodone or 2 mg of morphine.  Objective: Vital signs in last 24 hours: Blood pressure 114/68, pulse 86, temperature 97.5 F (36.4 C), temperature source Oral, resp. rate 18, height 6' 0.84" (1.85 m), weight 188 lb 7.9 oz (85.5 kg), SpO2 99.00%.  Intake/Output from previous day: 12/06 0701 - 12/07 0700 In: 166.5 [I.V.:166.5] Out: 325 [Urine:325]  Physical Exam: Lungs: Decreased breath sounds at the right lower chest, no respiratory distress Cardiac: Regular rate and rhythm Abdomen: Nontender, soft, no hepatosplenomegaly, bowel sounds are present Extremities: No leg edema   Lab Results:  [facility] 10/28/12 0138 10/27/12 1825  WBC 8.0 10.3  HGB 8.6* 9.5*  HCT 25.6* 28.2*  PLT 106* 116*    BMET  Basename 10/27/12 1825  NA 139  K 3.6  CL 101  CO2 26  GLUCOSE 109*  BUN 16  CREATININE 0.79  CALCIUM 8.6   Heparin level 0.26 Studies/Results: Ct Angio Chest Pe W/cm &/or Wo Cm  10/27/2012  *RADIOLOGY REPORT*  Clinical Data: Amyloidosis, now with fever and cough, history of right IJ blood clot, evaluate for pulmonary embolism  CT ANGIOGRAPHY CHEST  Technique:  Multidetector CT imaging of the chest using the standard protocol during bolus administration of intravenous contrast. Multiplanar reconstructed images including MIPs were obtained and reviewed to evaluate the vascular anatomy.  Contrast: OMNIPAQUE IOHEXOL 350 MG/ML SOLN  Comparison: None.  Vascular Findings:  There is adequate opacification of the pulmonary arteries with the main pulmonary artery measuring 243 HU.  There is near occlusive pulmonary embolism within the right lower lobe pulmonary artery extending into all segmental branches.  These findings are associated with right  basilar heterogeneous opacities which in this setting is worrisome for developing pulmonary infarct.  No additional filling defects are seen within the pulmonary arterial tree, though note, evaluation of the distal subsegmental vessels within the left lower lobe are degraded secondary to patient respiratory artifact.  Normal-caliber the main pulmonary artery.  No CT evidence of right-sided heart strain. There is no reflux of injected intravenous contrast into the hepatic venous system.  Normal heart size.  No pericardial effusion.  Scattered atherosclerotic plaque with a normal caliber thoracic aorta. Conventional configuration of the aortic arch.  The left vertebral artery is incidentally noted to arise directly from the aortic arch.  Note, evaluation of the right internal jugular vein was not evaluated on this examination secondary to left upper extremity intravenous injection.  ------------------------------------------------  Nonvascular findings:  Evaluation of the pulmonary parenchyma, in particular with the bilateral lung bases is degraded secondary to patient respiratory artifact.  Small right and trace left-sided pleural effusions. Right basilar heterogeneous air space opacities are worrisome for infection.  Left basilar linear heterogeneous opacities are favored to represent subsegmental atelectasis.  The central pulmonary airways are patent.  No pneumothorax.  Shoddy precarinal lymph node measures approximately 1 cm in greatest short axis diameter however maintains a benign fatty hilum (image 42, series four).  Additional scattered shoddy mediastinal lymph nodes are not enlarged by CT criteria.  No definite mediastinal, hilar or axillary lymphadenopathy.  Limited early arterial phase evaluation of the upper abdomen is unremarkable.  Heterogeneous appearance of the thyroid without discrete nodule.  No acute or aggressive osseous abnormalities.  DISH is suspected  within the thoracic spine.  IMPRESSION:  1.   Pulmonary embolism within the right lower lobe pulmonary artery and its segmental branches.  Overall clot burden is moderate.  No CT evidence of right-sided heart strain. 2.  Heterogeneous opacities within the right lower lobe are favored to represent evolving pulmonary infarction.  3.  Small right-sided pleural effusion, likely reactive.  Above findings discussed with Dr. Truett Perna at [phone].   Original Report Authenticated By: Tacey Ruiz, MD     Medications: I have reviewed the patient's current medications.  Assessment/Plan:  1. Acute pulmonary embolism-on heparin anticoagulation 2. Right neck DVT secondary to a pheresis catheter-hospitalized at [facility] from 10/23/2012 through 10/25/2012 and treated with heparin/Lovenox  3. Thrombocytopenia-improved, likely related to the stem cell harvest procedure. Low clinical suspicion for heparin or Lovenox related thrombocytopenia 4. Right lower extremity deep vein thrombosis 02/02/2012 5. Plasma cell dyscrasia/amyloidosis-scheduled for high-dose chemotherapy and stem cell support later this month 6. Acute nausea/vomiting and abdominal pain-etiology unclear at present. The pain and nausea began acutely this afternoon. He is on heparin so we need to consider a hemorrhagic event. We will continue supportive care with anti-emetics and narcotic analgesics. A chemistry panel, amylase/lipase, and repeat CBC will be obtained now. We will obtain an abdominal CT if the pain persists.     LOS: 1 day   Georgia Delsignore  10/28/2012, 11:01 PM

## 2012-10-28 NOTE — Progress Notes (Signed)
ANTICOAGULATION CONSULT NOTE - Follow Up Consult  Pharmacy Consult for IV heparin Indication: pulmonary embolus  No Known Allergies  Patient Measurements: Height: 6' 0.83" (185 cm) (as recorded earlier 12/6) Weight: 188 lb 7.9 oz (85.5 kg) IBW/kg (Calculated) : 79.52   Vital Signs: Temp: 98.8 F (37.1 C) (12/07 0630) Temp src: Oral (12/07 0630) BP: 113/61 mmHg (12/07 0630) Pulse Rate: 91  (12/07 0630)  Labs:  Basename 10/28/12 0138 10/27/12 1825 10/26/12 1456  HGB 8.6* 9.5* --  HCT 25.6* 28.2* 32.3*  PLT 106* 116* 78*  APTT -- 38* --  LABPROT -- -- --  INR -- -- --  HEPARINUNFRC 0.30 -- --  CREATININE -- 0.79 --  CKTOTAL -- -- --  CKMB -- -- --  TROPONINI -- -- --    Estimated Creatinine Clearance: 100.8 ml/min (by C-G formula based on Cr of 0.79).   Medical History: Past Medical History  Diagnosis Date  . Dyslipidemia   . H/O multiple myeloma   . Spastic quadriparesis   . DVT (deep venous thrombosis)     right  . Pneumonia     hx of with last time in Nov 2012  . Joint pain   . Sleep apnea     CPAP- q night, [place]- 2 yrs.   . Prostate cancer     amyloidosis, multiple myeloma   . Arthritis     lumbar DDD  . Peripheral vascular disease     DVT- 01/2012, follows by Dr. Truett Perna, lovenox maintained since Spring 2013 , pt. aware that last dose is sch. for 07/22/2012  . Blood dyscrasia     plasma cell dyscrasia with associated amyloidosis    Assessment:  51 yom with h/o plasma cell dyscrasia, newly diagnosed R internal jugular clot 10/22/12 felt to be 2/2 pheresis catheter for which patient was on Lovenox 80 mg q12h.  Pt presented to onc office 12/5 for persistent low-grade fever, started on Avelox, returned 12/6 for follow up and CT angio revealed RLL PE with moderate clot burden. Admit for IV heparin. Last dose of Lovenox 12/6 (last taken at 0800 per patient).  Plan transitioning back to therapeutic Lovenox if condition improves in the next several  days.  Initial platelets 78 K (per oncology note, thrombocytopenia felt likely 2/2 to recent pheresis procedure and pt had a normal plts count prior to pheresis) - will monitor closely with initiation of IV heparin.   Today PLT = 106  Heparin level = 0.26 with heparin infusing @ 1400 units/hr  No complications of therapy noted  Goal of Therapy:  Heparin level 0.3-0.7 units/ml Monitor platelets by anticoagulation protocol: Yes   Plan:   Increase heparin drip to 1600 units/hr  Recheck Heparin level 6 hours after rate increase  Daily heparin level and CBC  Continue to monitor H&H and platelets  Lynann Beaver PharmD, BCPS Pager [phone] 10/28/2012 9:41 AM

## 2012-10-28 NOTE — Progress Notes (Signed)
VASCULAR LAB PRELIMINARY  PRELIMINARY  PRELIMINARY  PRELIMINARY  Bilateral lower extremity venous duplex  completed.    Preliminary report:  Bilateral:  No evidence of acute DVT, superficial thrombosis, or Baker's Cyst.  Chronic right gastrocnemius vein thrombosis.    Muskaan Smet, RVT 10/28/2012, 10:48 AM

## 2012-10-28 NOTE — Progress Notes (Signed)
ANTICOAGULATION CONSULT NOTE - Follow Up Consult  Pharmacy Consult for Heparin Indication: pulmonary embolus  No Known Allergies  Vital Signs: Temp: 98.1 F (36.7 C) (12/07 1400) Temp src: Oral (12/07 1400) BP: 124/62 mmHg (12/07 1400) Pulse Rate: 92  (12/07 1400)  Labs:  Basename 10/28/12 1555 10/28/12 0857 10/28/12 0138 10/27/12 1825 10/26/12 1456  HGB -- -- 8.6* 9.5* --  HCT -- -- 25.6* 28.2* 32.3*  PLT -- -- 106* 116* 78*  APTT -- -- -- 38* --  LABPROT -- -- -- -- --  INR -- -- -- -- --  HEPARINUNFRC 0.24* 0.26* 0.30 -- --  CREATININE -- -- -- 0.79 --  CKTOTAL -- -- -- -- --  CKMB -- -- -- -- --  TROPONINI -- -- -- -- --    Estimated Creatinine Clearance: 100.8 ml/min (by C-G formula based on Cr of 0.79).   Assessment: Despite heparin rate increases, HL continues to trend down. Hep level=0.24 at 1600 units/hr and no IV line problem per RN. No bleeding per RN. Heparin subtherapeutic so will rebolus and increase rate.  Goal of Therapy:  Heparin level 0.3-0.7 units/ml Monitor platelets by anticoagulation protocol: Yes   Plan:  . Heparin bolus 1500 units IV x1 . Increase heparin rate to 1800 units/hr . F/u 6 hr Hep level  Dorethea Clan 10/28/2012,4:48 PM

## 2012-10-28 NOTE — Progress Notes (Signed)
ANTICOAGULATION CONSULT NOTE - Follow Up Consult  Pharmacy Consult for IV heparin Indication: pulmonary embolus  No Known Allergies  Patient Measurements: Height: 6' 0.83" (185 cm) (as recorded earlier 12/6) Weight: 188 lb 7.9 oz (85.5 kg) IBW/kg (Calculated) : 79.52  Heparin Dosing Weight:   Vital Signs: Temp: 98.4 F (36.9 C) (12/06 2350) Temp src: Oral (12/06 2350) BP: 115/64 mmHg (12/06 2350) Pulse Rate: 89  (12/06 2350)  Labs:  Basename 10/28/12 0138 10/27/12 1825 10/26/12 1456  HGB 8.6* 9.5* --  HCT 25.6* 28.2* 32.3*  PLT 106* 116* 78*  APTT -- 38* --  LABPROT -- -- --  INR -- -- --  HEPARINUNFRC 0.30 -- --  CREATININE -- 0.79 --  CKTOTAL -- -- --  CKMB -- -- --  TROPONINI -- -- --    Estimated Creatinine Clearance: 100.8 ml/min (by C-G formula based on Cr of 0.79).   Medical History: Past Medical History  Diagnosis Date  . Dyslipidemia   . H/O multiple myeloma   . Spastic quadriparesis   . DVT (deep venous thrombosis)     right  . Pneumonia     hx of with last time in Nov 2012  . Joint pain   . Sleep apnea     CPAP- q night, [place]- 2 yrs.   . Prostate cancer     amyloidosis, multiple myeloma   . Arthritis     lumbar DDD  . Peripheral vascular disease     DVT- 01/2012, follows by Dr. Truett Perna, lovenox maintained since Spring 2013 , pt. aware that last dose is sch. for 07/22/2012  . Blood dyscrasia     plasma cell dyscrasia with associated amyloidosis    Assessment:  57 yom with h/o plasma cell dyscrasia, newly diagnosed R internal jugular clot 10/22/12 felt to be 2/2 pheresis catheter for which patient was on Lovenox 80 mg q12h.  Pt presented to onc office 12/5 for persistent low-grade fever, started on Avelox, returned 12/6 for follow up and CT angio revealed RLL PE with moderate clot burden. Admit for IV heparin. Last dose of Lovenox 12/6 (last taken at 0800 per patient).  Plan transitioning back to therapeutic Lovenox if condition improves  in the next several days.  Initial platelets 78 K (per oncology note, thrombocytopenia felt likely 2/2 to recent pheresis procedure and pt had a normal plts count prior to pheresis) - will monitor closely with initiation of IV heparin.   PLTC = 106  Heparin level = 0.3 with heparin infusing @ 1300 units/hr (which is at the very low end of goal).  No complications of therapy noted  Will increase heparin level slightly to ensure therapeutic anticoagulation  Goal of Therapy:  Heparin level 0.3-0.7 units/ml Monitor platelets by anticoagulation protocol: Yes   Plan:   Increase heparin drip to 1400 units/hr  Recheck Heparin level 6 hours after rate increase  Daily heparin level and CBC  [name], PharmD 10/28/12 @ 02:24

## 2012-10-29 ENCOUNTER — Inpatient Hospital Stay (HOSPITAL_COMMUNITY): Payer: Medicare Other

## 2012-10-29 DIAGNOSIS — R109 Unspecified abdominal pain: Secondary | ICD-10-CM

## 2012-10-29 DIAGNOSIS — K559 Vascular disorder of intestine, unspecified: Secondary | ICD-10-CM

## 2012-10-29 DIAGNOSIS — R112 Nausea with vomiting, unspecified: Secondary | ICD-10-CM

## 2012-10-29 LAB — CBC
HCT: 36.5 % — ABNORMAL LOW (ref 39.0–52.0)
Hemoglobin: 12.2 g/dL — ABNORMAL LOW (ref 13.0–17.0)
MCH: 28.9 pg (ref 26.0–34.0)
MCHC: 33.4 g/dL (ref 30.0–36.0)
MCV: 86.5 fL (ref 78.0–100.0)
Platelets: 124 10*3/uL — ABNORMAL LOW (ref 150–400)
RBC: 4.22 MIL/uL (ref 4.22–5.81)
RDW: 15 % (ref 11.5–15.5)
WBC: 20.4 10*3/uL — ABNORMAL HIGH (ref 4.0–10.5)

## 2012-10-29 LAB — APTT
aPTT: 36 seconds (ref 24–37)
aPTT: 82 seconds — ABNORMAL HIGH (ref 24–37)
aPTT: 80 seconds — ABNORMAL HIGH (ref 24–37)

## 2012-10-29 LAB — ANTITHROMBIN III: AntiThromb III Func: 68 % — ABNORMAL LOW (ref 75–120)

## 2012-10-29 MED ORDER — SODIUM CHLORIDE 0.9 % IJ SOLN
10.0000 mL | Freq: Two times a day (BID) | INTRAMUSCULAR | Status: DC
  Administered 2012-10-29: 10 mL
  Administered 2012-10-30 (×2): 30 mL
  Administered 2012-10-31 – 2012-11-06 (×3): 10 mL
  Administered 2012-11-07: 20 mL
  Administered 2012-11-07: 10 mL
  Administered 2012-11-08 (×2): 20 mL
  Administered 2012-11-09 (×2): 10 mL
  Administered 2012-11-10 (×2): 20 mL
  Administered 2012-11-11 – 2012-11-12 (×4): 10 mL
  Administered 2012-11-13: 30 mL

## 2012-10-29 MED ORDER — HYDROMORPHONE HCL PF 2 MG/ML IJ SOLN
2.0000 mg | INTRAMUSCULAR | Status: DC | PRN
  Administered 2012-10-29 – 2012-11-03 (×9): 2 mg via INTRAVENOUS
  Filled 2012-10-29 (×11): qty 1

## 2012-10-29 MED ORDER — IOHEXOL 350 MG/ML SOLN
100.0000 mL | Freq: Once | INTRAVENOUS | Status: AC | PRN
  Administered 2012-10-29: 100 mL via INTRAVENOUS

## 2012-10-29 MED ORDER — VITAMINS A & D EX OINT
TOPICAL_OINTMENT | CUTANEOUS | Status: AC
  Administered 2012-10-29: 19:00:00
  Filled 2012-10-29: qty 5

## 2012-10-29 MED ORDER — SODIUM CHLORIDE 0.9 % IJ SOLN
10.0000 mL | INTRAMUSCULAR | Status: DC | PRN
  Administered 2012-10-31: 10 mL

## 2012-10-29 MED ORDER — MORPHINE SULFATE 4 MG/ML IJ SOLN
4.0000 mg | INTRAMUSCULAR | Status: DC | PRN
  Administered 2012-10-29: 6 mg via INTRAVENOUS
  Filled 2012-10-29: qty 2

## 2012-10-29 MED ORDER — HEPARIN (PORCINE) IN NACL 100-0.45 UNIT/ML-% IJ SOLN
2000.0000 [IU]/h | INTRAMUSCULAR | Status: DC
  Filled 2012-10-29: qty 250

## 2012-10-29 MED ORDER — ARGATROBAN 50 MG/50ML IV SOLN
1.0000 ug/kg/min | INTRAVENOUS | Status: DC
  Administered 2012-10-29 – 2012-11-13 (×38): 1 ug/kg/min via INTRAVENOUS
  Filled 2012-10-29 (×47): qty 50

## 2012-10-29 NOTE — Progress Notes (Signed)
ANTICOAGULATION CONSULT NOTE - Follow Up Consult  Pharmacy Consult for Heparin Indication: pulmonary embolus  No Known Allergies  Vital Signs: Temp: 97.5 F (36.4 C) (12/07 2138) Temp src: Oral (12/07 2138) BP: 114/68 mmHg (12/07 2138) Pulse Rate: 86  (12/07 2138)  Labs:  Basename 10/28/12 2253 10/28/12 1555 10/28/12 0857 10/28/12 0138 10/27/12 1825  HGB 10.4* -- -- 8.6* --  HCT 31.5* -- -- 25.6* 28.2*  PLT 91* -- -- 106* 116*  APTT -- -- -- -- 38*  LABPROT -- -- -- -- --  INR -- -- -- -- --  HEPARINUNFRC 0.23* 0.24* 0.26* -- --  CREATININE 0.86 -- -- -- 0.79  CKTOTAL -- -- -- -- --  CKMB -- -- -- -- --  TROPONINI -- -- -- -- --    Estimated Creatinine Clearance: 93.7 ml/min (by C-G formula based on Cr of 0.86).   Assessment: Despite heparin rate increases, HL continues to remain subtherapeutic. Hep level=0.23 at 1800 units/hr.  No complications of therapy noted.    Goal of Therapy:  Heparin level 0.3-0.7 units/ml Monitor platelets by anticoagulation protocol: Yes   Plan:  . Increase heparin rate to 2000 units/hr . F/u 6 hr Hep level  Mylan Schwarz, Joselyn Glassman, PharmD 10/29/2012,12:01 AM

## 2012-10-29 NOTE — Progress Notes (Addendum)
ANTICOAGULATION CONSULT NOTE -   Pharmacy Consult for Argatroban Indication: pulmonary embolus  No Known Allergies  Vital Signs: Temp: 97.5 F (36.4 C) (12/07 2138) Temp src: Oral (12/07 2138) BP: 114/68 mmHg (12/07 2138) Pulse Rate: 86  (12/07 2138)  Labs:  Basename 10/28/12 2253 10/28/12 1555 10/28/12 0857 10/28/12 0138 10/27/12 1825  HGB 10.4* -- -- 8.6* --  HCT 31.5* -- -- 25.6* 28.2*  PLT 91* -- -- 106* 116*  APTT -- -- -- -- 38*  LABPROT -- -- -- -- --  INR -- -- -- -- --  HEPARINUNFRC 0.23* 0.24* 0.26* -- --  CREATININE 0.86 -- -- -- 0.79  CKTOTAL -- -- -- -- --  CKMB -- -- -- -- --  TROPONINI -- -- -- -- --    Estimated Creatinine Clearance: 93.7 ml/min (by C-G formula based on Cr of 0.86).   Assessment: Spoke with Dr Truett Perna who is concerned of possible HIT given drop in PLTC.  IV heparin has been discontinued and Argatroban to begin.      HIT panel ordered  Goal of Therapy:  PTT 50-90 sec Monitor platelets by anticoagulation protocol: Yes   Plan:   IV heparin has been d/c'ed.  Check PTT and when PTT < 90, will begin Argatroban @ 1 mcg/kg/min (5.1 mg/hr)  Check PTT 2 hrs after start of Argatroban therapy  Check PTT every 2 hrs until two therapeutic levels are obtained  Check daily CBC and PTT daily while on Argatroban  Hellen Shanley, Joselyn Glassman, PharmD 10/29/2012,1:39 AM  ADDENDUM:  Initial PTT = 82 seconds with Argatroban infusing @ 5.1 mg/hr.  No complications of therapy noted.  PTT within goal range of 50-90 seconds.  Per protocol, will recheck PTT in 2 hrs to confirm therapeutic dose.  Continue Argatroban at current rate.  [name], PharmD 10/29/12 @ 06:50

## 2012-10-29 NOTE — Progress Notes (Signed)
ANTICOAGULATION CONSULT NOTE - Initial Consult  Pharmacy Consult for Argatroban Indication: pulmonary embolus  No Known Allergies  Patient Measurements: Height: 6' 0.83" (185 cm) (as recorded earlier 12/6) Weight: 188 lb 7.9 oz (85.5 kg) IBW/kg (Calculated) : 79.52   Vital Signs: Temp: 97.7 F (36.5 C) (12/08 0513) Temp src: Oral (12/08 0513) BP: 128/70 mmHg (12/08 0513) Pulse Rate: 100  (12/08 0513)  Labs:  Basename 10/29/12 0900 10/29/12 0430 10/29/12 0043 10/28/12 2253 10/28/12 1555 10/28/12 0857 10/28/12 0138 10/27/12 1825  HGB -- 12.2* -- 10.4* -- -- -- --  HCT -- 36.5* -- 31.5* -- -- 25.6* --  PLT -- 124* -- 91* -- -- 106* --  APTT 80* 82* 36 -- -- -- -- --  LABPROT -- -- -- -- -- -- -- --  INR -- -- -- -- -- -- -- --  HEPARINUNFRC -- -- -- 0.23* 0.24* 0.26* -- --  CREATININE -- -- -- 0.86 -- -- -- 0.79  CKTOTAL -- -- -- -- -- -- -- --  CKMB -- -- -- -- -- -- -- --  TROPONINI -- -- -- -- -- -- -- --    Estimated Creatinine Clearance: 93.7 ml/min (by C-G formula based on Cr of 0.86).  Medications:  Scheduled:    . [COMPLETED] heparin  1,500 Units Intravenous Once  . [COMPLETED] pantoprazole (PROTONIX) IV  40 mg Intravenous Once  . pantoprazole (PROTONIX) IV  40 mg Intravenous q morning - 10a  . senna  1 tablet Oral BID   Infusions:    . sodium chloride 100 mL/hr at 10/29/12 0934  . argatroban 1 mcg/kg/min (10/29/12 0834)  . [DISCONTINUED] heparin 1,600 Units/hr (10/28/12 1013)  . [DISCONTINUED] heparin Stopped (10/28/12 2206)  . [DISCONTINUED] heparin      Assessment: 84 yom with h/o plasma cell dyscrasia, newly diagnosed R internal jugular clot 10/22/12 (2/2 pheresis catheter) treated with LMWH outpt.  Pt admit with acute PE and started on IV heparin 12/6. Initially thrombocytopenia through to be 2/2 to recent pheresis, but now low clinical suspicion for HIT.  Changed IV heparin to Argatroban on 12/8, HIT panel ordered.  Today PLT = 124. Pt's c/o  abdominal pain concerning for bowel ischemia per MD notes. First aPTT on Argatroban was therapeutic (82) and repeat is therapeutic as well (80). No complications of therapy noted  Goal of Therapy:  aPTT 50-90 seconds Monitor platelets by anticoagulation protocol: Yes   Plan:   Continue Argatroban IV infusion at 5.1 ml/hr.  Daily CBC and aPTT.  Follow up HIT panel (currently in process)  Lynann Beaver PharmD, BCPS Pager [phone] 10/29/2012 10:38 AM

## 2012-10-29 NOTE — Progress Notes (Signed)
IP PROGRESS NOTE  Subjective:   The pain was relieved with Dilaudid during the night. He continues to have abdominal pain this morning. The nausea has improved. No further emesis. No diarrhea. The cough has improved.  Objective: Vital signs in last 24 hours: Blood pressure 128/70, pulse 100, temperature 97.7 F (36.5 C), temperature source Oral, resp. rate 18, height 6' 0.84" (1.85 m), weight 188 lb 7.9 oz (85.5 kg), SpO2 94.00%.  Intake/Output from previous day: 12/07 0701 - 12/08 0700 In: 1688.3 [P.O.:840; I.V.:848.3] Out: -   Physical Exam: HEENT: No thrush, a few ecchymoses Lungs: Inspiratory rhonchi at the right posterior base, no respiratory distress Cardiac: Regular rate and rhythm,? 2/6 diastolic murmur Abdomen: Mildly distended, mild diffuse tenderness, no hepatosplenomegaly, hypoactive bowel sounds Extremities: No leg edema   Lab Results:  Basename 10/29/12 0430 10/28/12 2253  WBC 20.4* 12.9*  HGB 12.2* 10.4*  HCT 36.5* 31.5*  PLT 124* 91*   Antithrombin III level 68% BMET  Basename 10/28/12 2253 10/27/12 1825  NA 136 139  K 3.6 3.6  CL 100 101  CO2 25 26  GLUCOSE 171* 109*  BUN 18 16  CREATININE 0.86 0.79  CALCIUM 8.4 8.6   PTT 82  Studies/Results: Ct Abdomen Pelvis Wo Contrast  10/29/2012  *RADIOLOGY REPORT*  Clinical Data: Patient on blood thinners.  Acute nausea and vomiting.  Question retroperitoneal hematoma.  Abdominal pain.  CT ABDOMEN AND PELVIS WITHOUT CONTRAST  Technique:  Multidetector CT imaging of the abdomen and pelvis was performed following the standard protocol without intravenous contrast.  Comparison: Chest CT 10/27/2012.  Findings: Airspace opacities throughout the right lower lung again noted as on recent chest CT, likely pulmonary infarction related to pulmonary embolus.  Minimal left base atelectasis.  No pleural effusions currently.  Gallstones are noted within the gallbladder.  Liver, spleen, pancreas, adrenals and kidneys have an  unremarkable unenhanced appearance.  There are abnormal small bowel loops in the abdomen pelvis with wall thickening and associated mesenteric edema.  Differential considerations would include infectious enteritis or ischemia.  No evidence of bowel obstruction.  Small to moderate free fluid in the abdomen and pelvis.  Colonic diverticulosis without active diverticulitis.  Urinary bladder is decompressed.  Aorta and iliac vessels are calcified, non-aneurysmal. No evidence of retroperitoneal hemorrhage.  No acute bony abnormality.  IMPRESSION: Abnormal wall thickening within the abdominal and pelvic small bowel loops with extensive mesenteric edema within the associated small bowel mesentery.  Small to moderate free fluid in the abdomen and pelvis.  Findings could be related to infectious enteritis or bowel ischemia.  Recommend clinical correlation.  Cholelithiasis.  Right lower lobe airspace opacity again noted compatible with pulmonary infarction as seen on prior CT chest.  Colonic diverticulosis.   Original Report Authenticated By: Charlett Nose, M.D.    Ct Angio Chest Pe W/cm &/or Wo Cm  10/27/2012  *RADIOLOGY REPORT*  Clinical Data: Amyloidosis, now with fever and cough, history of right IJ blood clot, evaluate for pulmonary embolism  CT ANGIOGRAPHY CHEST  Technique:  Multidetector CT imaging of the chest using the standard protocol during bolus administration of intravenous contrast. Multiplanar reconstructed images including MIPs were obtained and reviewed to evaluate the vascular anatomy.  Contrast: OMNIPAQUE IOHEXOL 350 MG/ML SOLN  Comparison: None.  Vascular Findings:  There is adequate opacification of the pulmonary arteries with the main pulmonary artery measuring 243 HU.  There is near occlusive pulmonary embolism within the right lower lobe pulmonary artery extending into all  segmental branches.  These findings are associated with right basilar heterogeneous opacities which in this setting is  worrisome for developing pulmonary infarct.  No additional filling defects are seen within the pulmonary arterial tree, though note, evaluation of the distal subsegmental vessels within the left lower lobe are degraded secondary to patient respiratory artifact.  Normal-caliber the main pulmonary artery.  No CT evidence of right-sided heart strain. There is no reflux of injected intravenous contrast into the hepatic venous system.  Normal heart size.  No pericardial effusion.  Scattered atherosclerotic plaque with a normal caliber thoracic aorta. Conventional configuration of the aortic arch.  The left vertebral artery is incidentally noted to arise directly from the aortic arch.  Note, evaluation of the right internal jugular vein was not evaluated on this examination secondary to left upper extremity intravenous injection.  ------------------------------------------------  Nonvascular findings:  Evaluation of the pulmonary parenchyma, in particular with the bilateral lung bases is degraded secondary to patient respiratory artifact.  Small right and trace left-sided pleural effusions. Right basilar heterogeneous air space opacities are worrisome for infection.  Left basilar linear heterogeneous opacities are favored to represent subsegmental atelectasis.  The central pulmonary airways are patent.  No pneumothorax.  Shoddy precarinal lymph node measures approximately 1 cm in greatest short axis diameter however maintains a benign fatty hilum (image 42, series four).  Additional scattered shoddy mediastinal lymph nodes are not enlarged by CT criteria.  No definite mediastinal, hilar or axillary lymphadenopathy.  Limited early arterial phase evaluation of the upper abdomen is unremarkable.  Heterogeneous appearance of the thyroid without discrete nodule.  No acute or aggressive osseous abnormalities.  DISH is suspected within the thoracic spine.  IMPRESSION:  1.  Pulmonary embolism within the right lower lobe pulmonary  artery and its segmental branches.  Overall clot burden is moderate.  No CT evidence of right-sided heart strain. 2.  Heterogeneous opacities within the right lower lobe are favored to represent evolving pulmonary infarction.  3.  Small right-sided pleural effusion, likely reactive.  Above findings discussed with Dr. Truett Perna at [phone].   Original Report Authenticated By: Tacey Ruiz, MD     Medications: I have reviewed the patient's current medications.  Assessment/Plan:  1. Acute pulmonary embolism-presumably from a right jugular DVT 2. Right neck DVT secondary to a pheresis catheter-hospitalized at [facility] from 10/23/2012 through 10/25/2012 and treated with heparin/Lovenox  3. Thrombocytopenia-improved, likely related to the stem cell harvest procedure. I continue to have a low clinical suspicion for heparin related thrombocytopenia/thrombosis, but given the events of 10/28/2012 and the persistent thrombocytopenia we have submitted a  HIT panel  4. Right lower extremity deep vein thrombosis 02/02/2012, Doppler examination of the legs 10/28/2012 revealed no acute DVT. 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 Plasma cell dyscrasia/amyloidosis-scheduled for high-dose chemotherapy and stem cell support later this month 6. Acute nausea/vomiting and abdominal pain on 10/28/2012. He continues to have abdominal pain. A CT on 10/28/2012 confirmed small bowel thickening and mesenteric edema concerning for an ischemic process. 7.? Diastolic heart murmur  He developed acute abdominal pain and nausea/vomiting on 10/28/2012 while on heparin anticoagulation for treatment of a pulmonary embolism. The etiology of the abdominal pain remains unclear, but I am concerned he has developed bowel ischemia. I discussed the case with the surgical service and critical care medicine yesterday evening. He has remained on anticoagulation therapy secondary to the pulmonary embolism and CT findings. I  switched the anticoagulation to argatroband  secondary to the possibility of HIT.  It is possible that he has bowel ischemia related to an embolic event from a paradoxical or cardiac embolus. I plan to discuss the case with my colleagues and the bone marrow transplant service at [facility] today. We will decide on the indication for obtaining a contrast CT of the abdomen.   LOS: 2 days   Finlee Concepcion  10/29/2012, 8:13 AM

## 2012-10-29 NOTE — Progress Notes (Signed)
Pt currently has PIV in Left Forearm with argatroban running. PICC was inserted for q 2 hour aPTT lab draws. Consulted Pharmacy about running argatroban through peripherally so that aPTT labs can be drawn from PICC, pharmacy agreed. Current PIV can be maintained in Left forearm until date of change, even though this extremity is now restricted post PICC insertion.

## 2012-10-30 ENCOUNTER — Inpatient Hospital Stay (HOSPITAL_COMMUNITY): Payer: Medicare Other

## 2012-10-30 DIAGNOSIS — K55059 Acute (reversible) ischemia of intestine, part and extent unspecified: Secondary | ICD-10-CM

## 2012-10-30 DIAGNOSIS — I81 Portal vein thrombosis: Secondary | ICD-10-CM

## 2012-10-30 DIAGNOSIS — R011 Cardiac murmur, unspecified: Secondary | ICD-10-CM

## 2012-10-30 LAB — BLOOD GAS, ARTERIAL
Acid-base deficit: 0.9 mmol/L (ref 0.0–2.0)
Bicarbonate: 22.2 mEq/L (ref 20.0–24.0)
Drawn by: 295031
FIO2: 0.21 %
O2 Saturation: 93.3 %
Patient temperature: 98.6
TCO2: 20.9 mmol/L (ref 0–100)
pCO2 arterial: 32.2 mmHg — ABNORMAL LOW (ref 35.0–45.0)
pH, Arterial: 7.454 — ABNORMAL HIGH (ref 7.350–7.450)
pO2, Arterial: 60.7 mmHg — ABNORMAL LOW (ref 80.0–100.0)

## 2012-10-30 LAB — CBC WITH DIFFERENTIAL/PLATELET
Basophils Absolute: 0 10*3/uL (ref 0.0–0.1)
Basophils Relative: 0 % (ref 0–1)
Eosinophils Absolute: 0 10*3/uL (ref 0.0–0.7)
Eosinophils Relative: 0 % (ref 0–5)
HCT: 28.1 % — ABNORMAL LOW (ref 39.0–52.0)
Hemoglobin: 9.4 g/dL — ABNORMAL LOW (ref 13.0–17.0)
Lymphocytes Relative: 3 % — ABNORMAL LOW (ref 12–46)
Lymphs Abs: 0.7 10*3/uL (ref 0.7–4.0)
MCH: 29 pg (ref 26.0–34.0)
MCHC: 33.5 g/dL (ref 30.0–36.0)
MCV: 86.7 fL (ref 78.0–100.0)
Monocytes Absolute: 2.3 10*3/uL — ABNORMAL HIGH (ref 0.1–1.0)
Monocytes Relative: 11 % (ref 3–12)
Neutro Abs: 18.5 10*3/uL — ABNORMAL HIGH (ref 1.7–7.7)
Neutrophils Relative %: 86 % — ABNORMAL HIGH (ref 43–77)
Platelets: 130 10*3/uL — ABNORMAL LOW (ref 150–400)
RBC: 3.24 MIL/uL — ABNORMAL LOW (ref 4.22–5.81)
RDW: 15.1 % (ref 11.5–15.5)
WBC: 21.4 10*3/uL — ABNORMAL HIGH (ref 4.0–10.5)

## 2012-10-30 LAB — COMPREHENSIVE METABOLIC PANEL
ALT: 21 U/L (ref 0–53)
AST: 11 U/L (ref 0–37)
Albumin: 2.2 g/dL — ABNORMAL LOW (ref 3.5–5.2)
Alkaline Phosphatase: 88 U/L (ref 39–117)
BUN: 39 mg/dL — ABNORMAL HIGH (ref 6–23)
CO2: 22 mEq/L (ref 19–32)
Calcium: 7.8 mg/dL — ABNORMAL LOW (ref 8.4–10.5)
Chloride: 101 mEq/L (ref 96–112)
Creatinine, Ser: 1.9 mg/dL — ABNORMAL HIGH (ref 0.50–1.35)
GFR calc Af Amer: 40 mL/min — ABNORMAL LOW (ref >90)
GFR calc non Af Amer: 35 mL/min — ABNORMAL LOW (ref >90)
Glucose, Bld: 152 mg/dL — ABNORMAL HIGH (ref 70–99)
Potassium: 4.1 mEq/L (ref 3.5–5.1)
Sodium: 136 mEq/L (ref 135–145)
Total Bilirubin: 0.4 mg/dL (ref 0.3–1.2)
Total Protein: 5.1 g/dL — ABNORMAL LOW (ref 6.0–8.3)

## 2012-10-30 LAB — URINE MICROSCOPIC-ADD ON

## 2012-10-30 LAB — URINALYSIS, ROUTINE W REFLEX MICROSCOPIC
Bilirubin Urine: NEGATIVE
Glucose, UA: NEGATIVE mg/dL
Hgb urine dipstick: NEGATIVE
Ketones, ur: NEGATIVE mg/dL
Leukocytes, UA: NEGATIVE
Nitrite: NEGATIVE
Protein, ur: 30 mg/dL — AB
Specific Gravity, Urine: 1.041 — ABNORMAL HIGH (ref 1.005–1.030)
Urobilinogen, UA: 1 mg/dL (ref 0.0–1.0)
pH: 5.5 (ref 5.0–8.0)

## 2012-10-30 LAB — LACTIC ACID, PLASMA: Lactic Acid, Venous: 1.4 mmol/L (ref 0.5–2.2)

## 2012-10-30 LAB — APTT: aPTT: 79 seconds — ABNORMAL HIGH (ref 24–37)

## 2012-10-30 MED ORDER — PIPERACILLIN-TAZOBACTAM 3.375 G IVPB
3.3750 g | Freq: Three times a day (TID) | INTRAVENOUS | Status: DC
  Administered 2012-10-30 – 2012-11-06 (×21): 3.375 g via INTRAVENOUS
  Filled 2012-10-30 (×23): qty 50

## 2012-10-30 MED ORDER — SODIUM CHLORIDE 0.45 % IV BOLUS
1000.0000 mL | Freq: Once | INTRAVENOUS | Status: AC
  Administered 2012-10-30: 1000 mL via INTRAVENOUS

## 2012-10-30 MED ORDER — SODIUM CHLORIDE 0.9 % IV SOLN
INTRAVENOUS | Status: AC
  Administered 2012-10-30: 17:00:00 via INTRAVENOUS

## 2012-10-30 MED ORDER — SODIUM CHLORIDE 0.9 % IV SOLN
INTRAVENOUS | Status: DC | PRN
  Administered 2012-10-30: 13:00:00 via INTRAVENOUS
  Administered 2012-10-31: 20 mL/h via INTRAVENOUS
  Administered 2012-11-05: 20 mL via INTRAVENOUS

## 2012-10-30 MED ORDER — LORAZEPAM 2 MG/ML IJ SOLN
0.5000 mg | Freq: Every evening | INTRAMUSCULAR | Status: DC | PRN
  Administered 2012-10-30: 0.5 mg via INTRAVENOUS
  Filled 2012-10-30: qty 1

## 2012-10-30 MED ORDER — SODIUM CHLORIDE 0.9 % IV SOLN
INTRAVENOUS | Status: DC
  Administered 2012-10-30 – 2012-10-31 (×2): via INTRAVENOUS

## 2012-10-30 NOTE — Progress Notes (Signed)
IP PROGRESS NOTE  Subjective:   The pain is much better. Not requiring pain medication. No bowel movement. No nausea. The dyspnea and cough have improved.  Objective: Vital signs in last 24 hours: Blood pressure 116/56, pulse 90, temperature 97.6 F (36.4 C), temperature source Oral, resp. rate 18, height 6' 0.84" (1.85 m), weight 188 lb 7.9 oz (85.5 kg), SpO2 100.00%.  Intake/Output from previous day: 12/08 0701 - 12/09 0700 In: 3155 [P.O.:540; I.V.:2615] Out: 620 [Urine:620]  Physical Exam:  Lungs: Inspiratory rhonchi at the right posterior base, no respiratory distress Cardiac: Regular rate and rhythm Abdomen: Distended, high pitched bowel sounds, tender in the right lower and mid lower abdomen. Extremities: No leg edema   Lab Results:  Basename 10/30/12 0426 10/29/12 0430  WBC 21.4* 20.4*  HGB 9.4* 12.2*  HCT 28.1* 36.5*  PLT 130* 124*   Antithrombin III level 68% BMET  Basename 10/30/12 0426 10/28/12 2253  NA 136 136  K 4.1 3.6  CL 101 100  CO2 22 25  GLUCOSE 152* 171*  BUN 39* 18  CREATININE 1.90* 0.86  CALCIUM 7.8* 8.4   PTT 82  Studies/Results: Ct Abdomen Pelvis Wo Contrast  10/29/2012  *RADIOLOGY REPORT*  Clinical Data: Patient on blood thinners.  Acute nausea and vomiting.  Question retroperitoneal hematoma.  Abdominal pain.  CT ABDOMEN AND PELVIS WITHOUT CONTRAST  Technique:  Multidetector CT imaging of the abdomen and pelvis was performed following the standard protocol without intravenous contrast.  Comparison: Chest CT 10/27/2012.  Findings: Airspace opacities throughout the right lower lung again noted as on recent chest CT, likely pulmonary infarction related to pulmonary embolus.  Minimal left base atelectasis.  No pleural effusions currently.  Gallstones are noted within the gallbladder.  Liver, spleen, pancreas, adrenals and kidneys have an unremarkable unenhanced appearance.  There are abnormal small bowel loops in the abdomen pelvis with wall  thickening and associated mesenteric edema.  Differential considerations would include infectious enteritis or ischemia.  No evidence of bowel obstruction.  Small to moderate free fluid in the abdomen and pelvis.  Colonic diverticulosis without active diverticulitis.  Urinary bladder is decompressed.  Aorta and iliac vessels are calcified, non-aneurysmal. No evidence of retroperitoneal hemorrhage.  No acute bony abnormality.  IMPRESSION: Abnormal wall thickening within the abdominal and pelvic small bowel loops with extensive mesenteric edema within the associated small bowel mesentery.  Small to moderate free fluid in the abdomen and pelvis.  Findings could be related to infectious enteritis or bowel ischemia.  Recommend clinical correlation.  Cholelithiasis.  Right lower lobe airspace opacity again noted compatible with pulmonary infarction as seen on prior CT chest.  Colonic diverticulosis.   Original Report Authenticated By: Charlett Nose, M.D.    Ct Angio Abd/pel W/ And/or W/o  10/29/2012  *RADIOLOGY REPORT*  Clinical Data:  Mesenteric ischemia.  Nausea and vomiting. Abdominal pain.  Elevated white blood cell count.  CT ANGIOGRAPHY ABDOMEN AND PELVIS  Technique:  Multidetector CT imaging of the abdomen and pelvis was performed using the standard protocol during bolus administration of intravenous contrast.  Multiplanar reconstructed images including MIPs were obtained and reviewed to evaluate the vascular anatomy.  Contrast: OMNIPAQUE IOHEXOL 350 MG/ML SOLN  Comparison:  10/28/2012.  Findings:  Consolidation is present in the posterior right lower lobe, compatible with pneumonia.  Atelectasis in the left lower lobe.  Right small parapneumonic effusion is present.  Moderate volume of ascites is present around the liver and spleen. Small low-density lesion in the  spleen is present, likely representing a small cyst.  Transient hepatic attenuation difference is present in the right hepatic lobe on arterial  phase imaging.  Abdominal aortic atherosclerosis is present.  No acute aortic abnormality.  In the anatomic pelvis, there is high density material layering within the ascites in the pouch of Douglas, likely representing debris.  Clinically correlate for peritonitis.  There is no free air identified in the abdomen or pelvis.  Gallbladder is distended.  Gallstones are present in the gallbladder neck.  No common duct stone is identified.  Patulous gastroesophageal junction.  Stomach appears within normal limits. Duodenum normal.  The celiac axis is patent.  Superior mesenteric artery also appears patent.  There is thrombus in the superior mesenteric vein.  There is no portal vein thrombosis.  The inferior mesenteric vein appears within normal limits.  Colonic diverticulosis is present.  Inflammatory changes of bowel are present in the anatomic pelvis, likely associated with venous congestion and possibly venous infarction.  No pneumatosis at this time.  No portal venous gas.  In the liver, there is thrombus in the right hepatic lobe portal veins, demonstrated an area of low attenuation (image number 27 series 4).  The splenic vein and inferior mesenteric vein appear patent.  Iliofemoral atherosclerosis is present without aneurysm. Postoperative changes of the lumbar spine.  No aggressive osseous lesions. Posterior lumbar interbody fusion.  Diffuse mesenteric edema is present.   Review of the MIP images confirms the above findings.  IMPRESSION: 1.  Thrombus in the superior mesenteric vein.  Superior thrombus is present in the intrahepatic portal veins in the right hepatic lobe accounting for transient hepatic attenuation difference. 2. 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  No pneumatosis or portal venous gas however based on the inflammatory changes, venous infarction is probably developing. 3.  Moderate volume of ascites, with complex debris/fluid layering in the anatomic pelvis. 4.  No significant  arterial occlusive disease. 5.  Right lower lobe consolidation compatible with pneumonia. 6.  Cholelithiasis.  No common duct stone.   Original Report Authenticated By: Andreas Newport, M.D.     Medications: I have reviewed the patient's current medications.  Assessment/Plan:  1. Acute pulmonary embolism-presumably from a right jugular DVT 2. Right neck DVT secondary to a pheresis catheter-hospitalized at [facility] from 10/23/2012 through 10/25/2012 and treated with heparin/Lovenox  3. Thrombocytopenia-improved, likely related to the stem cell harvest procedure.HIT panel is pending  4. Right lower extremity deep vein thrombosis 02/02/2012, Doppler examination of the legs 10/28/2012 revealed no acute DVT. 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 Plasma cell dyscrasia/amyloidosis-scheduled for high-dose chemotherapy and stem cell support next week. 6. Acute nausea/vomiting and abdominal pain on 10/28/2012. A CT 10/29/2012 confirmed and SMV/portal vein thrombosis with changes of bowel ischemia. Continue anticoagulation with Argatroban. 7.? Diastolic heart murmur 8. Elevated creatinine-? Related to the SMV thrombosis,? Contrast related  He has experienced multiple thrombotic events over the past 2 weeks. A HIT panel is pending. He is now symptomatic with SMV thrombosis. I discussed the case with the bone marrow transplant service at [facility] yesterday and with Gen. surgery. No apparent indication for surgical intervention at present. The plan is to continue anticoagulation, intravenous hydration, and supportive therapy. We will follow the urine output and creatinine closely. He will remain n.p.o. except for sips of fluid.   LOS: 3 days   Brent Allison  10/30/2012, 7:19 AM

## 2012-10-30 NOTE — Consult Note (Signed)
Reason for Consult:Abdominal pain Referring Physician: Dr. Perrin Smack is an 66 y.o. male.  HPI: 66 yr old male who was admitted to [facility] on 12/6 due to a acute right side pulmonary embolism likely related to a recent right internal jugular clot after pheresis catheter.  He was found to have thormbocytopenia as well.  There was a suspension for HIT so the patient was started on argatroban.  Since admission he develped severe abdominal pain with nausea and vomiting.  This was Saturday night.  A CT was obtain which found ischemic enteritis due to mesenteric venous thrombosis.  No pneumatosis or portal venous gas however based on the inflammatory changes, venous infarction is probably developing.  He was placed on bowel rest and IVFs.  Since Saturday his abdominal has improved but is still very tender over umbilicus and right flank.  He remote h/o 2 neck and 1 back surgeries this year and did have a prophylactic IVC filter based but has since been removed.  He also has a h/o radical proctectomy and inguinal hernia repair.  At current he denies vomiting and nausea since Sunday AM.  He does not have a fever or chills at current.  He has not had a BM since Saturday.  He is also developing renal insufficiency.    Past Medical History  Diagnosis Date  . Dyslipidemia   . H/O multiple myeloma   . Spastic quadriparesis   . DVT (deep venous thrombosis)     right  . Pneumonia     hx of with last time in Nov 2012  . Joint pain   . Sleep apnea     CPAP- q night, [place] March- 2 yrs.   . Prostate cancer     amyloidosis, multiple myeloma   . Arthritis     lumbar DDD  . Peripheral vascular disease     DVT- 01/2012, follows by Dr. Truett Perna, lovenox maintained since Spring 2013 , pt. aware that last dose is sch. for 07/22/2012  . Blood dyscrasia     plasma cell dyscrasia with associated amyloidosis    Past Surgical History  Procedure Date  . Prostatectomy around 2005  . Posterior laminectomy /  decompression lumbar spine around 2007    L4-5  . Carpal tunnel release 2009    left  . Back surgery   . Anterior cervical decomp/discectomy fusion 02/26/2012    Procedure: ANTERIOR CERVICAL DECOMPRESSION/DISCECTOMY FUSION 2 LEVELS;  Surgeon: Hewitt Shorts, MD;  Location: [facility];  Service: Neurosurgery;  Laterality: N/A;  C3-4 C4-5 Anterior cervical decompression/diskectomy, fusion  . Tonsillectomy     as a child  . Hernia repair     around 1985  . Cardiac catheterization 2010  . Colonoscopy   . Lipoma excision around 1985    removed from one of his shoulders  . Posterior cervical fusion/foraminotomy 04/06/2012    Procedure: POSTERIOR CERVICAL FUSION/FORAMINOTOMY LEVEL 3;  Surgeon: Hewitt Shorts, MD;  Location: [facility];  Service: Neurosurgery;  Laterality: N/A;  C3 and C4 laminectomy with C23 C34 C45 posterior cervical arthrodesis with instrumentation  . Ivt     IVC filter placed d/t blood clots, removed- July- 2013    Family History  Problem Relation Age of Onset  . Anesthesia problems Neg Hx   . Hypotension Neg Hx   . Malignant hyperthermia Neg Hx   . Pseudochol deficiency Neg Hx     Social History:  reports that he quit smoking about  33 years ago. He has never used smokeless tobacco. He reports that he drinks about .6 ounces of alcohol per week. He reports that he does not use illicit drugs.  Allergies: No Known Allergies  Medications: I have reviewed the patient's current medications.  Results for orders placed during the hospital encounter of 10/27/12 (from the past 48 hour(s))  HEPARIN LEVEL (UNFRACTIONATED)     Status: Abnormal   Collection Time   10/28/12  8:57 AM      Component Value Range Comment   Heparin Unfractionated 0.26 (*) 0.30 - 0.70 IU/mL   HEPARIN LEVEL (UNFRACTIONATED)     Status: Abnormal   Collection Time   10/28/12  3:55 PM      Component Value Range Comment   Heparin Unfractionated 0.24 (*) 0.30 - 0.70 IU/mL   HEPARIN LEVEL  (UNFRACTIONATED)     Status: Abnormal   Collection Time   10/28/12 10:53 PM      Component Value Range Comment   Heparin Unfractionated 0.23 (*) 0.30 - 0.70 IU/mL   COMPREHENSIVE METABOLIC PANEL     Status: Abnormal   Collection Time   10/28/12 10:53 PM      Component Value Range Comment   Sodium 136  135 - 145 mEq/L    Potassium 3.6  3.5 - 5.1 mEq/L    Chloride 100  96 - 112 mEq/L    CO2 25  19 - 32 mEq/L    Glucose, Bld 171 (*) 70 - 99 mg/dL    BUN 18  6 - 23 mg/dL    Creatinine, Ser 5.78  0.50 - 1.35 mg/dL    Calcium 8.4  8.4 - 46.9 mg/dL    Total Protein 5.8 (*) 6.0 - 8.3 g/dL    Albumin 2.5 (*) 3.5 - 5.2 g/dL    AST 41 (*) 0 - 37 U/L    ALT 44  0 - 53 U/L    Alkaline Phosphatase 131 (*) 39 - 117 U/L    Total Bilirubin 0.6  0.3 - 1.2 mg/dL    GFR calc non Af Amer 88 (*) >90 mL/min    GFR calc Af Amer >90  >90 mL/min   AMYLASE     Status: Normal   Collection Time   10/28/12 10:53 PM      Component Value Range Comment   Amylase 26  0 - 105 U/L   LIPASE, BLOOD     Status: Abnormal   Collection Time   10/28/12 10:53 PM      Component Value Range Comment   Lipase 10 (*) 11 - 59 U/L   SAMPLE TO BLOOD BANK     Status: Normal   Collection Time   10/28/12 10:53 PM      Component Value Range Comment   Blood Bank Specimen SAMPLE AVAILABLE FOR TESTING      Sample Expiration 10/31/2012     CBC     Status: Abnormal   Collection Time   10/28/12 10:53 PM      Component Value Range Comment   WBC 12.9 (*) 4.0 - 10.5 K/uL    RBC 3.64 (*) 4.22 - 5.81 MIL/uL    Hemoglobin 10.4 (*) 13.0 - 17.0 g/dL    HCT 62.9 (*) 52.8 - 52.0 %    MCV 86.5  78.0 - 100.0 fL    MCH 28.8  26.0 - 34.0 pg    MCHC 33.3  30.0 - 36.0 g/dL    RDW 41.3  24.4 -  15.5 %    Platelets 91 (*) 150 - 400 K/uL   ANTITHROMBIN III     Status: Abnormal   Collection Time   10/29/12 12:39 AM      Component Value Range Comment   AntiThromb III Func 68 (*) 75 - 120 %   APTT     Status: Normal   Collection Time   10/29/12  12:43 AM      Component Value Range Comment   aPTT 36  24 - 37 seconds   APTT     Status: Abnormal   Collection Time   10/29/12  4:30 AM      Component Value Range Comment   aPTT 82 (*) 24 - 37 seconds   CBC     Status: Abnormal   Collection Time   10/29/12  4:30 AM      Component Value Range Comment   WBC 20.4 (*) 4.0 - 10.5 K/uL    RBC 4.22  4.22 - 5.81 MIL/uL    Hemoglobin 12.2 (*) 13.0 - 17.0 g/dL    HCT 60.4 (*) 54.0 - 52.0 %    MCV 86.5  78.0 - 100.0 fL    MCH 28.9  26.0 - 34.0 pg    MCHC 33.4  30.0 - 36.0 g/dL    RDW 98.1  19.1 - 47.8 %    Platelets 124 (*) 150 - 400 K/uL   APTT     Status: Abnormal   Collection Time   10/29/12  9:00 AM      Component Value Range Comment   aPTT 80 (*) 24 - 37 seconds   APTT     Status: Abnormal   Collection Time   10/30/12  4:26 AM      Component Value Range Comment   aPTT 79 (*) 24 - 37 seconds   COMPREHENSIVE METABOLIC PANEL     Status: Abnormal   Collection Time   10/30/12  4:26 AM      Component Value Range Comment   Sodium 136  135 - 145 mEq/L    Potassium 4.1  3.5 - 5.1 mEq/L    Chloride 101  96 - 112 mEq/L    CO2 22  19 - 32 mEq/L    Glucose, Bld 152 (*) 70 - 99 mg/dL    BUN 39 (*) 6 - 23 mg/dL    Creatinine, Ser 2.95 (*) 0.50 - 1.35 mg/dL    Calcium 7.8 (*) 8.4 - 10.5 mg/dL    Total Protein 5.1 (*) 6.0 - 8.3 g/dL    Albumin 2.2 (*) 3.5 - 5.2 g/dL    AST 11  0 - 37 U/L    ALT 21  0 - 53 U/L    Alkaline Phosphatase 88  39 - 117 U/L    Total Bilirubin 0.4  0.3 - 1.2 mg/dL    GFR calc non Af Amer 35 (*) >90 mL/min    GFR calc Af Amer 40 (*) >90 mL/min   CBC WITH DIFFERENTIAL     Status: Abnormal   Collection Time   10/30/12  4:26 AM      Component Value Range Comment   WBC 21.4 (*) 4.0 - 10.5 K/uL    RBC 3.24 (*) 4.22 - 5.81 MIL/uL    Hemoglobin 9.4 (*) 13.0 - 17.0 g/dL    HCT 62.1 (*) 30.8 - 52.0 %    MCV 86.7  78.0 - 100.0 fL    MCH 29.0  26.0 -  34.0 pg    MCHC 33.5  30.0 - 36.0 g/dL    RDW 45.4  09.8 - 11.9 %     Platelets 130 (*) 150 - 400 K/uL    Neutrophils Relative 86 (*) 43 - 77 %    Neutro Abs 18.5 (*) 1.7 - 7.7 K/uL    Lymphocytes Relative 3 (*) 12 - 46 %    Lymphs Abs 0.7  0.7 - 4.0 K/uL    Monocytes Relative 11  3 - 12 %    Monocytes Absolute 2.3 (*) 0.1 - 1.0 K/uL    Eosinophils Relative 0  0 - 5 %    Eosinophils Absolute 0.0  0.0 - 0.7 K/uL    Basophils Relative 0  0 - 1 %    Basophils Absolute 0.0  0.0 - 0.1 K/uL     Ct Abdomen Pelvis Wo Contrast  10/29/2012  *RADIOLOGY REPORT*  Clinical Data: Patient on blood thinners.  Acute nausea and vomiting.  Question retroperitoneal hematoma.  Abdominal pain.  CT ABDOMEN AND PELVIS WITHOUT CONTRAST  Technique:  Multidetector CT imaging of the abdomen and pelvis was performed following the standard protocol without intravenous contrast.  Comparison: Chest CT 10/27/2012.  Findings: Airspace opacities throughout the right lower lung again noted as on recent chest CT, likely pulmonary infarction related to pulmonary embolus.  Minimal left base atelectasis.  No pleural effusions currently.  Gallstones are noted within the gallbladder.  Liver, spleen, pancreas, adrenals and kidneys have an unremarkable unenhanced appearance.  There are abnormal small bowel loops in the abdomen pelvis with wall thickening and associated mesenteric edema.  Differential considerations would include infectious enteritis or ischemia.  No evidence of bowel obstruction.  Small to moderate free fluid in the abdomen and pelvis.  Colonic diverticulosis without active diverticulitis.  Urinary bladder is decompressed.  Aorta and iliac vessels are calcified, non-aneurysmal. No evidence of retroperitoneal hemorrhage.  No acute bony abnormality.  IMPRESSION: Abnormal wall thickening within the abdominal and pelvic small bowel loops with extensive mesenteric edema within the associated small bowel mesentery.  Small to moderate free fluid in the abdomen and pelvis.  Findings could be related to  infectious enteritis or bowel ischemia.  Recommend clinical correlation.  Cholelithiasis.  Right lower lobe airspace opacity again noted compatible with pulmonary infarction as seen on prior CT chest.  Colonic diverticulosis.   Original Report Authenticated By: Charlett Nose, M.D.    Ct Angio Abd/pel W/ And/or W/o  10/29/2012  *RADIOLOGY REPORT*  Clinical Data:  Mesenteric ischemia.  Nausea and vomiting. Abdominal pain.  Elevated Verdis Koval blood cell count.  CT ANGIOGRAPHY ABDOMEN AND PELVIS  Technique:  Multidetector CT imaging of the abdomen and pelvis was performed using the standard protocol during bolus administration of intravenous contrast.  Multiplanar reconstructed images including MIPs were obtained and reviewed to evaluate the vascular anatomy.  Contrast: OMNIPAQUE IOHEXOL 350 MG/ML SOLN  Comparison:  10/28/2012.  Findings:  Consolidation is present in the posterior right lower lobe, compatible with pneumonia.  Atelectasis in the left lower lobe.  Right small parapneumonic effusion is present.  Moderate volume of ascites is present around the liver and spleen. Small low-density lesion in the spleen is present, likely representing a small cyst.  Transient hepatic attenuation difference is present in the right hepatic lobe on arterial phase imaging.  Abdominal aortic atherosclerosis is present.  No acute aortic abnormality.  In the anatomic pelvis, there is high density material layering within the ascites in the  pouch of [place], likely representing debris.  Clinically correlate for peritonitis.  There is no free air identified in the abdomen or pelvis.  Gallbladder is distended.  Gallstones are present in the gallbladder neck.  No common duct stone is identified.  Patulous gastroesophageal junction.  Stomach appears within normal limits. Duodenum normal.  The celiac axis is patent.  Superior mesenteric artery also appears patent.  There is thrombus in the superior mesenteric vein.  There is no portal  vein thrombosis.  The inferior mesenteric vein appears within normal limits.  Colonic diverticulosis is present.  Inflammatory changes of bowel are present in the anatomic pelvis, likely associated with venous congestion and possibly venous infarction.  No pneumatosis at this time.  No portal venous gas.  In the liver, there is thrombus in the right hepatic lobe portal veins, demonstrated an area of low attenuation (image number 27 series 4).  The splenic vein and inferior mesenteric vein appear patent.  Iliofemoral atherosclerosis is present without aneurysm. Postoperative changes of the lumbar spine.  No aggressive osseous lesions. Posterior lumbar interbody fusion.  Diffuse mesenteric edema is present.   Review of the MIP images confirms the above findings.  IMPRESSION: 1.  Thrombus in the superior mesenteric vein.  Superior thrombus is present in the intrahepatic portal veins in the right hepatic lobe accounting for transient hepatic attenuation difference. 2. 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  No pneumatosis or portal venous gas however based on the inflammatory changes, venous infarction is probably developing. 3.  Moderate volume of ascites, with complex debris/fluid layering in the anatomic pelvis. 4.  No significant arterial occlusive disease. 5.  Right lower lobe consolidation compatible with pneumonia. 6.  Cholelithiasis.  No common duct stone.   Original Report Authenticated By: Andreas Newport, M.D.     Review of Systems  Constitutional: Positive for fever (low grade prior to admission) and malaise/fatigue. Negative for chills.  HENT: Positive for neck pain (chronic).   Eyes: Negative.   Respiratory: Positive for cough (prior to admission) and shortness of breath (mild prior to admission).   Cardiovascular: Negative.   Gastrointestinal: Positive for nausea (esp saturday), vomiting (esp saturday) and abdominal pain (more on right and umbilical). Negative for diarrhea.   Genitourinary: Negative.   Musculoskeletal: Positive for back pain (chronic).  Skin: Negative.   Neurological: Negative.   Endo/Heme/Allergies: Bruises/bleeds easily.  Psychiatric/Behavioral: Negative.    Blood pressure 116/56, pulse 90, temperature 97.6 F (36.4 C), temperature source Oral, resp. rate 18, height 6' 0.84" (1.85 m), weight 188 lb 7.9 oz (85.5 kg), SpO2 100.00%. Physical Exam  Constitutional: He is oriented to person, place, and time. He appears well-developed and well-nourished. No distress.  HENT:  Head: Normocephalic and atraumatic.  Eyes: Conjunctivae normal are normal. Pupils are equal, round, and reactive to light.  Neck: Normal range of motion. Neck supple.  Cardiovascular: Normal rate and regular rhythm.   Respiratory: Effort normal.       Decreased in bases   GI: He exhibits distension (through out). There is tenderness (esp on right and umbilical). There is guarding (mild).       Scar in midline in suprapubic area  No bowel sounds  Musculoskeletal: Normal range of motion.  Neurological: He is alert and oriented to person, place, and time.  Skin: Skin is warm and dry.    Assessment/Plan: 1.  Possible ischemic colitis: given the patient's history and recent diagnosis this is worrisome for ischemia of the bowel.  We have  ordered a lactic acid and ABG for further evaluation of his situation.  He at current does not diffuse peritonitis so would recommend bowel rest and fluid restoration.  He is developing renal insuffiencey likely due to dehydration and recent contrast load.  Will continue argatroban as at current he does not have a surgical emergency.  We will recheck the patient after his lab work to make further recommendations.  Thank you for this consult and we will follow.   Spoke with Dr. Derrell Lolling and he recommends a fluid bolus of 1,000cc over 2 hours to help with hydration.  He will check on patient later this AM. Brent Allison 10/30/2012, 8:06 AM

## 2012-10-30 NOTE — Consult Note (Signed)
General surgery attending note:  I have interviewed and examined this patient. I essentially agree with the assessment and treatment plan outlined by Clance Boll, PA.  Patient has amyloidosis and possible multiple myeloma, He is scheduled for stem cell transplant next week at [facility].. Recent DVT of the right IJ at pheresis catheter site was diagnosed. Subsequent was diagnosed with pulmonary embolism. Subsequently developed abdominal pain.   CT scan shows mesenteric venous thrombosis of the SMV and PV. There is no significant arterial occlusive disease. Some bowel wall edema was noted but no pneumatosis or obstruction. Just an ileus.  Examination reveals that the abdomen is soft with mild diffuse tenderness and no peritoneal signs. Lower midline scar and inguinal scar noted. No hernias.  At this point in time he may have some venous congestion and ascites,  but there is no evidence of infarction or compromised bowel. I recommended we continue to treat him with bowel rest, n.p.o., anticoagulation.We'll add empiric antibiotics due to his leukocytosis. Defer assessment of underlying hypercoagulable state to Dr. Truett Perna.  We will follow along with you closely.   Angelia Mould. Derrell Lolling, M.D., [facility] General and Minimally invasive Surgery Breast and Colorectal Surgery Office:   [phone] Pager:   [phone]

## 2012-10-30 NOTE — Consult Note (Signed)
Reason for Consult: Acute renal failure with poor urine output Referring Physician: Dr. Mancel Bale   HPI:  Brent Allison is a 66 year old Caucasian man with a plasma cell dyscrasia and associated amyloidosis. He has been treated in the past with Cytoxan/Velcade/Decadron and most Revlimid/Decadron with the last cycle beginning on 01/28/2012. He underwent stem cell collection at [facility] on 10/17/2012 and 10/18/2012. 2 days after, while visiting [facility], was found to have a right internal jugular DVT related to his pheresis catheter manifest as right arm swelling. He was transported to [facility] via ambulance and started on a heparin infusion after pheresis catheter was removed. He was discharged home on Lovenox. Unfortunately, he continued to have a persistent low-grade fever (previous workup at [facility] was negative and fever attributed to clot). When seen at Dr. Kalman Drape clinic on 10/26/12, he reported mild shortness of breath and a dry cough with some hemoptysis that morning. He was referred for a chest x-ray which was essentially negative. He was started on a course of Avelox.  He kept getting worse with worsening shortness of breath, pleuritic right sided chest pain and hemoptysis. Consequently underwent CT angiogram on 10/27/2012 that revealed moderate sized right lower lobe pulmonary embolus with no radiological right ventricular strain pattern. He was consequently admitted and started on heparin. Chest symptoms improved however he then started developing abdominal pain, nausea and dyspepsia that prompted CT of the abdomen and pelvis on 10/29/2012 that revealed thrombosis in portal vein as well as superior mesenteric vein. As he was also having progressive thrombocytopenia-he was suspected to be having HIT and transition over to Argatroban. Over the past 24 hours, he has had continued abdominal discomfort/distention and workup reveals that he now has ileus.  He does not have prior history of acute renal failure,  kidney stones or hematuria (microscopic hematuria noted back in August while on Lovenox). He is not on any RAS blocking agents, nonsteroidal anti-inflammatory drugs or diuretics. No obvious nephrotoxic medications identified. No prior history of diabetes or hypertension, no history of obstructive uropathy, no history of frequent urinary tract infections and no history of autoimmune disorder/familial renal disease history. Creatinine trend as shown below:   07/20/2012  08/01/2012  10/27/2012  10/28/2012  10/30/2012   Creatinine 0.89 1.03 0.79 0.86 1.90 (H)     Past Medical History  Diagnosis Date  . Dyslipidemia   . H/O multiple myeloma   . Spastic quadriparesis   . DVT (deep venous thrombosis)     right  . Pneumonia     hx of with last time in Nov 2012  . Joint pain   . Sleep apnea     CPAP- q night, [place]- 2 yrs.   . Prostate cancer     amyloidosis, multiple myeloma   . Arthritis     lumbar DDD  . Peripheral vascular disease     DVT- 01/2012, follows by Dr. Truett Perna, lovenox maintained since Spring 2013 , pt. aware that last dose is sch. for 07/22/2012  . Blood dyscrasia     plasma cell dyscrasia with associated amyloidosis    Past Surgical History  Procedure Date  . Prostatectomy around 2005  . Posterior laminectomy / decompression lumbar spine around 2007    L4-5  . Carpal tunnel release 2009    left  . Back surgery   . Anterior cervical decomp/discectomy fusion 02/26/2012    Procedure: ANTERIOR CERVICAL DECOMPRESSION/DISCECTOMY FUSION 2 LEVELS;  Surgeon: Hewitt Shorts, MD;  Location: [facility];  Service: Neurosurgery;  Laterality: N/A;  C3-4 C4-5 Anterior cervical decompression/diskectomy, fusion  . Tonsillectomy     as a child  . Hernia repair     around 1985  . Cardiac catheterization 2010  . Colonoscopy   . Lipoma excision around 1985    removed from one of his shoulders  . Posterior cervical fusion/foraminotomy 04/06/2012    Procedure: POSTERIOR CERVICAL  FUSION/FORAMINOTOMY LEVEL 3;  Surgeon: Hewitt Shorts, MD;  Location: [facility];  Service: Neurosurgery;  Laterality: N/A;  C3 and C4 laminectomy with C23 C34 C45 posterior cervical arthrodesis with instrumentation  . Ivt     IVC filter placed d/t blood clots, removed- July- 2013    Family History  Problem Relation Age of Onset  . Anesthesia problems Neg Hx   . Hypotension Neg Hx   . Malignant hyperthermia Neg Hx   . Pseudochol deficiency Neg Hx     Social History:  reports that he quit smoking about 33 years ago. He has never used smokeless tobacco. He reports that he drinks about .6 ounces of alcohol per week. He reports that he does not use illicit drugs.  Allergies: No Known Allergies  Medications:  Scheduled:   . pantoprazole (PROTONIX) IV  40 mg Intravenous q morning - 10a  . piperacillin-tazobactam (ZOSYN)  IV  3.375 g Intravenous Q8H  . [COMPLETED] sodium chloride  1,000 mL Intravenous Once  . sodium chloride  10-40 mL Intracatheter Q12H  . [COMPLETED] vitamin A & D      . [DISCONTINUED] senna  1 tablet Oral BID    Results for orders placed during the hospital encounter of 10/27/12 (from the past 48 hour(s))  HEPARIN LEVEL (UNFRACTIONATED)     Status: Abnormal   Collection Time   10/28/12 10:53 PM      Component Value Range Comment   Heparin Unfractionated 0.23 (*) 0.30 - 0.70 IU/mL   COMPREHENSIVE METABOLIC PANEL     Status: Abnormal   Collection Time   10/28/12 10:53 PM      Component Value Range Comment   Sodium 136  135 - 145 mEq/L    Potassium 3.6  3.5 - 5.1 mEq/L    Chloride 100  96 - 112 mEq/L    CO2 25  19 - 32 mEq/L    Glucose, Bld 171 (*) 70 - 99 mg/dL    BUN 18  6 - 23 mg/dL    Creatinine, Ser 9.60  0.50 - 1.35 mg/dL    Calcium 8.4  8.4 - 45.4 mg/dL    Total Protein 5.8 (*) 6.0 - 8.3 g/dL    Albumin 2.5 (*) 3.5 - 5.2 g/dL    AST 41 (*) 0 - 37 U/L    ALT 44  0 - 53 U/L    Alkaline Phosphatase 131 (*) 39 - 117 U/L    Total Bilirubin 0.6  0.3 -  1.2 mg/dL    GFR calc non Af Amer 88 (*) >90 mL/min    GFR calc Af Amer >90  >90 mL/min   AMYLASE     Status: Normal   Collection Time   10/28/12 10:53 PM      Component Value Range Comment   Amylase 26  0 - 105 U/L   LIPASE, BLOOD     Status: Abnormal   Collection Time   10/28/12 10:53 PM      Component Value Range Comment   Lipase 10 (*) 11 - 59 U/L  SAMPLE TO BLOOD BANK     Status: Normal   Collection Time   10/28/12 10:53 PM      Component Value Range Comment   Blood Bank Specimen SAMPLE AVAILABLE FOR TESTING      Sample Expiration 10/31/2012     CBC     Status: Abnormal   Collection Time   10/28/12 10:53 PM      Component Value Range Comment   WBC 12.9 (*) 4.0 - 10.5 K/uL    RBC 3.64 (*) 4.22 - 5.81 MIL/uL    Hemoglobin 10.4 (*) 13.0 - 17.0 g/dL    HCT 16.1 (*) 09.6 - 52.0 %    MCV 86.5  78.0 - 100.0 fL    MCH 28.8  26.0 - 34.0 pg    MCHC 33.3  30.0 - 36.0 g/dL    RDW 04.5  40.9 - 81.1 %    Platelets 91 (*) 150 - 400 K/uL   ANTITHROMBIN III     Status: Abnormal   Collection Time   10/29/12 12:39 AM      Component Value Range Comment   AntiThromb III Func 68 (*) 75 - 120 %   APTT     Status: Normal   Collection Time   10/29/12 12:43 AM      Component Value Range Comment   aPTT 36  24 - 37 seconds   APTT     Status: Abnormal   Collection Time   10/29/12  4:30 AM      Component Value Range Comment   aPTT 82 (*) 24 - 37 seconds   CBC     Status: Abnormal   Collection Time   10/29/12  4:30 AM      Component Value Range Comment   WBC 20.4 (*) 4.0 - 10.5 K/uL    RBC 4.22  4.22 - 5.81 MIL/uL    Hemoglobin 12.2 (*) 13.0 - 17.0 g/dL    HCT 91.4 (*) 78.2 - 52.0 %    MCV 86.5  78.0 - 100.0 fL    MCH 28.9  26.0 - 34.0 pg    MCHC 33.4  30.0 - 36.0 g/dL    RDW 95.6  21.3 - 08.6 %    Platelets 124 (*) 150 - 400 K/uL   APTT     Status: Abnormal   Collection Time   10/29/12  9:00 AM      Component Value Range Comment   aPTT 80 (*) 24 - 37 seconds   APTT     Status:  Abnormal   Collection Time   10/30/12  4:26 AM      Component Value Range Comment   aPTT 79 (*) 24 - 37 seconds   COMPREHENSIVE METABOLIC PANEL     Status: Abnormal   Collection Time   10/30/12  4:26 AM      Component Value Range Comment   Sodium 136  135 - 145 mEq/L    Potassium 4.1  3.5 - 5.1 mEq/L    Chloride 101  96 - 112 mEq/L    CO2 22  19 - 32 mEq/L    Glucose, Bld 152 (*) 70 - 99 mg/dL    BUN 39 (*) 6 - 23 mg/dL    Creatinine, Ser 5.78 (*) 0.50 - 1.35 mg/dL    Calcium 7.8 (*) 8.4 - 10.5 mg/dL    Total Protein 5.1 (*) 6.0 - 8.3 g/dL    Albumin 2.2 (*) 3.5 - 5.2 g/dL  AST 11  0 - 37 U/L    ALT 21  0 - 53 U/L    Alkaline Phosphatase 88  39 - 117 U/L    Total Bilirubin 0.4  0.3 - 1.2 mg/dL    GFR calc non Af Amer 35 (*) >90 mL/min    GFR calc Af Amer 40 (*) >90 mL/min   CBC WITH DIFFERENTIAL     Status: Abnormal   Collection Time   10/30/12  4:26 AM      Component Value Range Comment   WBC 21.4 (*) 4.0 - 10.5 K/uL    RBC 3.24 (*) 4.22 - 5.81 MIL/uL    Hemoglobin 9.4 (*) 13.0 - 17.0 g/dL    HCT 96.0 (*) 45.4 - 52.0 %    MCV 86.7  78.0 - 100.0 fL    MCH 29.0  26.0 - 34.0 pg    MCHC 33.5  30.0 - 36.0 g/dL    RDW 09.8  11.9 - 14.7 %    Platelets 130 (*) 150 - 400 K/uL    Neutrophils Relative 86 (*) 43 - 77 %    Neutro Abs 18.5 (*) 1.7 - 7.7 K/uL    Lymphocytes Relative 3 (*) 12 - 46 %    Lymphs Abs 0.7  0.7 - 4.0 K/uL    Monocytes Relative 11  3 - 12 %    Monocytes Absolute 2.3 (*) 0.1 - 1.0 K/uL    Eosinophils Relative 0  0 - 5 %    Eosinophils Absolute 0.0  0.0 - 0.7 K/uL    Basophils Relative 0  0 - 1 %    Basophils Absolute 0.0  0.0 - 0.1 K/uL   LACTIC ACID, PLASMA     Status: Normal   Collection Time   10/30/12  8:15 AM      Component Value Range Comment   Lactic Acid, Venous 1.4  0.5 - 2.2 mmol/L   BLOOD GAS, ARTERIAL     Status: Abnormal   Collection Time   10/30/12 11:33 AM      Component Value Range Comment   FIO2 0.21      Delivery systems ROOM AIR       pH, Arterial 7.454 (*) 7.350 - 7.450    pCO2 arterial 32.2 (*) 35.0 - 45.0 mmHg    pO2, Arterial 60.7 (*) 80.0 - 100.0 mmHg    Bicarbonate 22.2  20.0 - 24.0 mEq/L    TCO2 20.9  0 - 100 mmol/L    Acid-base deficit 0.9  0.0 - 2.0 mmol/L    O2 Saturation 93.3      Patient temperature 98.6      Collection site RIGHT RADIAL      Drawn by [phone]      Sample type ARTERIAL DRAW      Allens test (pass/fail) PASS  PASS     Ct Abdomen Pelvis Wo Contrast  10/29/2012  *RADIOLOGY REPORT*  Clinical Data: Patient on blood thinners.  Acute nausea and vomiting.  Question retroperitoneal hematoma.  Abdominal pain.  CT ABDOMEN AND PELVIS WITHOUT CONTRAST  Technique:  Multidetector CT imaging of the abdomen and pelvis was performed following the standard protocol without intravenous contrast.  Comparison: Chest CT 10/27/2012.  Findings: Airspace opacities throughout the right lower lung again noted as on recent chest CT, likely pulmonary infarction related to pulmonary embolus.  Minimal left base atelectasis.  No pleural effusions currently.  Gallstones are noted within the gallbladder.  Liver, spleen, pancreas,  adrenals and kidneys have an unremarkable unenhanced appearance.  There are abnormal small bowel loops in the abdomen pelvis with wall thickening and associated mesenteric edema.  Differential considerations would include infectious enteritis or ischemia.  No evidence of bowel obstruction.  Small to moderate free fluid in the abdomen and pelvis.  Colonic diverticulosis without active diverticulitis.  Urinary bladder is decompressed.  Aorta and iliac vessels are calcified, non-aneurysmal. No evidence of retroperitoneal hemorrhage.  No acute bony abnormality.  IMPRESSION: Abnormal wall thickening within the abdominal and pelvic small bowel loops with extensive mesenteric edema within the associated small bowel mesentery.  Small to moderate free fluid in the abdomen and pelvis.  Findings could be related to  infectious enteritis or bowel ischemia.  Recommend clinical correlation.  Cholelithiasis.  Right lower lobe airspace opacity again noted compatible with pulmonary infarction as seen on prior CT chest.  Colonic diverticulosis.   Original Report Authenticated By: Charlett Nose, M.D.    Ct Angio Abd/pel W/ And/or W/o  10/29/2012  *RADIOLOGY REPORT*  Clinical Data:  Mesenteric ischemia.  Nausea and vomiting. Abdominal pain.  Elevated white blood cell count.  CT ANGIOGRAPHY ABDOMEN AND PELVIS  Technique:  Multidetector CT imaging of the abdomen and pelvis was performed using the standard protocol during bolus administration of intravenous contrast.  Multiplanar reconstructed images including MIPs were obtained and reviewed to evaluate the vascular anatomy.  Contrast: OMNIPAQUE IOHEXOL 350 MG/ML SOLN  Comparison:  10/28/2012.  Findings:  Consolidation is present in the posterior right lower lobe, compatible with pneumonia.  Atelectasis in the left lower lobe.  Right small parapneumonic effusion is present.  Moderate volume of ascites is present around the liver and spleen. Small low-density lesion in the spleen is present, likely representing a small cyst.  Transient hepatic attenuation difference is present in the right hepatic lobe on arterial phase imaging.  Abdominal aortic atherosclerosis is present.  No acute aortic abnormality.  In the anatomic pelvis, there is high density material layering within the ascites in the pouch of Douglas, likely representing debris.  Clinically correlate for peritonitis.  There is no free air identified in the abdomen or pelvis.  Gallbladder is distended.  Gallstones are present in the gallbladder neck.  No common duct stone is identified.  Patulous gastroesophageal junction.  Stomach appears within normal limits. Duodenum normal.  The celiac axis is patent.  Superior mesenteric artery also appears patent.  There is thrombus in the superior mesenteric vein.  There is no portal  vein thrombosis.  The inferior mesenteric vein appears within normal limits.  Colonic diverticulosis is present.  Inflammatory changes of bowel are present in the anatomic pelvis, likely associated with venous congestion and possibly venous infarction.  No pneumatosis at this time.  No portal venous gas.  In the liver, there is thrombus in the right hepatic lobe portal veins, demonstrated an area of low attenuation (image number 27 series 4).  The splenic vein and inferior mesenteric vein appear patent.  Iliofemoral atherosclerosis is present without aneurysm. Postoperative changes of the lumbar spine.  No aggressive osseous lesions. Posterior lumbar interbody fusion.  Diffuse mesenteric edema is present.   Review of the MIP images confirms the above findings.  IMPRESSION: 1.  Thrombus in the superior mesenteric vein.  Superior thrombus is present in the intrahepatic portal veins in the right hepatic lobe accounting for transient hepatic attenuation difference. 2. 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  No pneumatosis or portal venous gas however based on  the inflammatory changes, venous infarction is probably developing. 3.  Moderate volume of ascites, with complex debris/fluid layering in the anatomic pelvis. 4.  No significant arterial occlusive disease. 5.  Right lower lobe consolidation compatible with pneumonia. 6.  Cholelithiasis.  No common duct stone.   Original Report Authenticated By: Andreas Newport, M.D.     Review of Systems  Constitutional: Positive for chills. Negative for fever, weight loss, malaise/fatigue and diaphoresis.  HENT: Negative.   Eyes: Negative.   Respiratory: Negative.   Cardiovascular: Negative.   Gastrointestinal: Positive for nausea, abdominal pain and constipation. Negative for heartburn, vomiting, diarrhea, blood in stool and melena.       Hiccups for the past 24hrs  Genitourinary: Negative.   Musculoskeletal: Negative.   Skin: Positive for rash.  Negative for itching.       Transient rash (2 days after discharge from [facility])  Neurological: Positive for weakness. Negative for dizziness, tingling, tremors, sensory change, speech change, focal weakness and seizures.  Endo/Heme/Allergies: Negative.   Psychiatric/Behavioral: Negative.   All other systems reviewed and are negative.   Blood pressure 107/55, pulse 98, temperature 97.9 F (36.6 C), temperature source Oral, resp. rate 16, height 6' 0.84" (1.85 m), weight 85.5 kg (188 lb 7.9 oz), SpO2 99.00%. Physical Exam  Nursing note and vitals reviewed. Constitutional: He is oriented to person, place, and time. He appears well-developed and well-nourished. No distress.       Appears uncomfortable  HENT:  Head: Normocephalic and atraumatic.  Mouth/Throat: No oropharyngeal exudate.       Oropharynx appears dry  Eyes: Conjunctivae normal and EOM are normal. Pupils are equal, round, and reactive to light.  Neck: Normal range of motion. Neck supple. No JVD present. No tracheal deviation present. No thyromegaly present.  Cardiovascular: Normal rate, regular rhythm and intact distal pulses.  Exam reveals no gallop and no friction rub.   No murmur heard. Respiratory: Effort normal. No stridor. No respiratory distress. He has no wheezes. He has rales.       Poor inspiratory effort with decreased BS bibasally Coarse rales right base  GI: Soft. He exhibits distension. He exhibits no mass. There is tenderness. There is no rebound and no guarding.       Minimally audible bowel sounds  Musculoskeletal: Normal range of motion. He exhibits no edema and no tenderness.  Lymphadenopathy:    He has no cervical adenopathy.  Neurological: He is alert and oriented to person, place, and time. No cranial nerve deficit. Coordination normal.  Skin: Skin is warm and dry. No rash noted. He is not diaphoretic. No erythema.  Psychiatric: He has a normal mood and affect. His behavior is normal. Thought content  normal.    Assessment/Plan: 1. Acute renal failure: The nurse reports that he voided 400 cc prior to me visiting in his room. Currently appears to be non-oliguric. Acute renal failure given current history as well as available data base is most probably due to contrast-induced nephropathy with recent CT scans. May also have additional injury from hemodynamically mediated mechanisms with ileus/third spacing of fluid. Portal vein thrombosis noted however no clear hepatic dysfunction lowering the possibility of hepatorenal pathophysiology. We'll have a Foley catheter placed for bladder decompression/accurate input and output measurements. Await renal ultrasound. Continue current fluids-normal saline at 150 cc an hour for the next 6 hours then decrease to 100 cc an hour to avoid problems with volume overload in the face of diminished urine output. Urine electrolytes will be obtained.  No acute dialysis needs noted. Possible differential diagnosis with a diffuse thrombosis includes renal vein thrombosis. 2. Plasma cell dyscrasia/amyloidosis: Status post stem cell harvest, transplantation when more clinically stable. 3. Anemia: Suspect may be related to his underlying plasma cell dyscrasia, management per Dr. Truett Perna. 4. Right IJ thrombus, pulmonary embolus, SVC thrombus and portal vein thrombus: Currently on Argatroban with concerns for heparin-induced thrombocytopenia  Lachlan Mckim K. 10/30/2012, 4:11 PM

## 2012-10-30 NOTE — Progress Notes (Signed)
ANTIBIOTIC CONSULT NOTE - INITIAL  Pharmacy Consult for Zosyn  Indication: Empiric for GI coverage  No Known Allergies  Patient Measurements: Height: 6' 0.83" (185 cm) (as recorded earlier 12/6) Weight: 188 lb 7.9 oz (85.5 kg) IBW/kg (Calculated) : 79.52   Vital Signs: Temp: 97.6 F (36.4 C) (12/09 0431) Temp src: Oral (12/09 0431) BP: 116/56 mmHg (12/09 0431) Pulse Rate: 90  (12/09 0431) Intake/Output from previous day: 12/08 0701 - 12/09 0700 In: 3155 [P.O.:540; I.V.:2615] Out: 620 [Urine:620] Intake/Output from this shift: Total I/O In: 30 [I.V.:30] Out: -   Labs:  Basename 10/30/12 0426 10/29/12 0430 10/28/12 2253 10/27/12 1825  WBC 21.4* 20.4* 12.9* --  HGB 9.4* 12.2* 10.4* --  PLT 130* 124* 91* --  LABCREA -- -- -- --  CREATININE 1.90* -- 0.86 0.79   Estimated Creatinine Clearance: 42.4 ml/min (by C-G formula based on Cr of 1.9). No results found for this basename: VANCOTROUGH:2,VANCOPEAK:2,VANCORANDOM:2,GENTTROUGH:2,GENTPEAK:2,GENTRANDOM:2,TOBRATROUGH:2,TOBRAPEAK:2,TOBRARND:2,AMIKACINPEAK:2,AMIKACINTROU:2,AMIKACIN:2, in the last 72 hours   Microbiology: No results found for this or any previous visit (from the past 720 hour(s)).  Medical History: Past Medical History  Diagnosis Date  . Dyslipidemia   . H/O multiple myeloma   . Spastic quadriparesis   . DVT (deep venous thrombosis)     right  . Pneumonia     hx of with last time in Nov 2012  . Joint pain   . Sleep apnea     CPAP- q night, [place] [facility]- 2 yrs.   . Prostate cancer     amyloidosis, multiple myeloma   . Arthritis     lumbar DDD  . Peripheral vascular disease     DVT- 01/2012, follows by Dr. Truett Perna, lovenox maintained since Spring 2013 , pt. aware that last dose is sch. for 07/22/2012  . Blood dyscrasia     plasma cell dyscrasia with associated amyloidosis    Medications:  Scheduled:    . pantoprazole (PROTONIX) IV  40 mg Intravenous q morning - 10a  . piperacillin-tazobactam  (ZOSYN)  IV  3.375 g Intravenous Q8H  . sodium chloride  1,000 mL Intravenous Once  . sodium chloride  10-40 mL Intracatheter Q12H  . [COMPLETED] vitamin A & D      . [DISCONTINUED] senna  1 tablet Oral BID   Anti-infectives     Start     Dose/Rate Route Frequency Ordered Stop   10/30/12 1015   piperacillin-tazobactam (ZOSYN) IVPB 3.375 g        3.375 g 12.5 mL/hr over 240 Minutes Intravenous Every 8 hours 10/30/12 1005           Assessment: 66 YOM with possible ischemic colitis to start Zosyn per pharmacy for GI coverage/leukocytosis.    Plan:   Begin Zosyn 3.375 g IV every 8 hours (4 hour infusion)  Follow renal function and adjust zosyn accordingly  Foye Clock Sheron Robin, Pharm.D. Clinical Oncology Pharmacist  Pager # [phone]  10/30/2012,10:20 AM

## 2012-10-30 NOTE — Progress Notes (Signed)
ANTICOAGULATION CONSULT NOTE - Follow Up Consult  Pharmacy Consult for Argatroban Indication: pulmonary embolus  No Known Allergies  Patient Measurements: Height: 6' 0.83" (185 cm) (as recorded earlier 12/6) Weight: 188 lb 7.9 oz (85.5 kg) IBW/kg (Calculated) : 79.52   Vital Signs: Temp: 97.6 F (36.4 C) (12/09 0431) Temp src: Oral (12/09 0431) BP: 116/56 mmHg (12/09 0431) Pulse Rate: 90  (12/09 0431)  Labs:  Basename 10/30/12 0426 10/29/12 0900 10/29/12 0430 10/28/12 2253 10/28/12 1555 10/28/12 0857 10/27/12 1825  HGB 9.4* -- 12.2* -- -- -- --  HCT 28.1* -- 36.5* 31.5* -- -- --  PLT 130* -- 124* 91* -- -- --  APTT 79* 80* 82* -- -- -- --  LABPROT -- -- -- -- -- -- --  INR -- -- -- -- -- -- --  HEPARINUNFRC -- -- -- 0.23* 0.24* 0.26* --  CREATININE -- -- -- 0.86 -- -- 0.79  CKTOTAL -- -- -- -- -- -- --  CKMB -- -- -- -- -- -- --  TROPONINI -- -- -- -- -- -- --    Estimated Creatinine Clearance: 93.7 ml/min (by C-G formula based on Cr of 0.86).  Medications:  Scheduled:     . pantoprazole (PROTONIX) IV  40 mg Intravenous q morning - 10a  . sodium chloride  10-40 mL Intracatheter Q12H  . [COMPLETED] vitamin A & D      . [DISCONTINUED] senna  1 tablet Oral BID   Infusions:     . sodium chloride 125 mL/hr at 10/29/12 2014  . argatroban 1 mcg/kg/min (10/29/12 1901)    Assessment: 89 yom with h/o plasma cell dyscrasia, newly diagnosed R internal jugular clot 10/22/12 (2/2 pheresis catheter) treated with LMWH outpt.  Pt admit with acute PE and started on IV heparin 12/6. Initially thrombocytopenia through to be 2/2 to recent pheresis, but now low clinical suspicion for HIT.  Changed IV heparin to Argatroban on 12/8, HIT panel ordered.  Today PLT = 130. Pt's c/o abdominal pain concerning for bowel ischemia per MD notes. APTT therapeutic today (79 sec) No complications of therapy noted  Goal of Therapy:  aPTT 50-90 seconds Monitor platelets by anticoagulation  protocol: Yes   Plan:   Continue Argatroban IV infusion at 5.1 ml/hr.  Daily CBC and aPTT.  Follow up HIT panel (currently in process)  Brent Allison, PharmD 10/30/2012 4:54 AM

## 2012-10-31 DIAGNOSIS — R3915 Urgency of urination: Secondary | ICD-10-CM

## 2012-10-31 DIAGNOSIS — N179 Acute kidney failure, unspecified: Secondary | ICD-10-CM

## 2012-10-31 DIAGNOSIS — K55069 Acute infarction of intestine, part and extent unspecified: Secondary | ICD-10-CM | POA: Diagnosis present

## 2012-10-31 LAB — CBC
HCT: 29.2 % — ABNORMAL LOW (ref 39.0–52.0)
Hemoglobin: 9.7 g/dL — ABNORMAL LOW (ref 13.0–17.0)
MCH: 28.7 pg (ref 26.0–34.0)
MCHC: 33.2 g/dL (ref 30.0–36.0)
MCV: 86.4 fL (ref 78.0–100.0)
Platelets: 148 10*3/uL — ABNORMAL LOW (ref 150–400)
RBC: 3.38 MIL/uL — ABNORMAL LOW (ref 4.22–5.81)
RDW: 15.4 % (ref 11.5–15.5)
WBC: 14.6 10*3/uL — ABNORMAL HIGH (ref 4.0–10.5)

## 2012-10-31 LAB — COMPREHENSIVE METABOLIC PANEL
ALT: 16 U/L (ref 0–53)
AST: 13 U/L (ref 0–37)
Albumin: 2.2 g/dL — ABNORMAL LOW (ref 3.5–5.2)
Alkaline Phosphatase: 77 U/L (ref 39–117)
BUN: 46 mg/dL — ABNORMAL HIGH (ref 6–23)
CO2: 22 mEq/L (ref 19–32)
Calcium: 7.9 mg/dL — ABNORMAL LOW (ref 8.4–10.5)
Chloride: 104 mEq/L (ref 96–112)
Creatinine, Ser: 1.75 mg/dL — ABNORMAL HIGH (ref 0.50–1.35)
GFR calc Af Amer: 45 mL/min — ABNORMAL LOW (ref >90)
GFR calc non Af Amer: 39 mL/min — ABNORMAL LOW (ref >90)
Glucose, Bld: 125 mg/dL — ABNORMAL HIGH (ref 70–99)
Potassium: 4.1 mEq/L (ref 3.5–5.1)
Sodium: 136 mEq/L (ref 135–145)
Total Bilirubin: 0.4 mg/dL (ref 0.3–1.2)
Total Protein: 5.2 g/dL — ABNORMAL LOW (ref 6.0–8.3)

## 2012-10-31 LAB — CREATININE, URINE, RANDOM: Creatinine, Urine: 179.2 mg/dL

## 2012-10-31 LAB — ANA: Anti Nuclear Antibody(ANA): POSITIVE — AB

## 2012-10-31 LAB — C4 COMPLEMENT: Complement C4, Body Fluid: 32 mg/dL (ref 10–40)

## 2012-10-31 LAB — ANTI-NUCLEAR AB-TITER (ANA TITER): ANA Titer 1: NEGATIVE

## 2012-10-31 LAB — SODIUM, URINE, RANDOM: Sodium, Ur: 25 mEq/L

## 2012-10-31 LAB — APTT: aPTT: 54 seconds — ABNORMAL HIGH (ref 24–37)

## 2012-10-31 LAB — C3 COMPLEMENT: C3 Complement: 157 mg/dL (ref 90–180)

## 2012-10-31 MED ORDER — CLINIMIX E/DEXTROSE (5/20) 5 % IV SOLN
INTRAVENOUS | Status: AC
  Administered 2012-10-31: 18:00:00 via INTRAVENOUS
  Filled 2012-10-31: qty 1000

## 2012-10-31 MED ORDER — TRAVASOL 10 % IV SOLN: INTRAVENOUS | Status: DC

## 2012-10-31 NOTE — Progress Notes (Signed)
INITIAL ADULT NUTRITION ASSESSMENT Date: 10/31/2012   Time: 1:41 PM Reason for Assessment: TPN consult  INTERVENTION: TPN per pharmacy. Diet advancement per MD. Will monitor.   ASSESSMENT: Male 66 y.o.  Dx: Fever, cough, shortness of breath   Food/Nutrition Related Hx: Pt with plasma cell dyscrasia with associated amyloidosis s/p chemotherapy and stem cell collection in addition to history of multiple myeloma and prostate CA. Pt reports eating 3 small meals/day at home, not on any nutritional supplements. Pt reports he may have lost 5-10 pounds PTA, past records show that pt has lost 6 pounds in the past 3 months. Pt with ileus, scheduled to start TPN tonight. Last BM on 12/7.   Hx:  Past Medical History  Diagnosis Date  . Dyslipidemia   . H/O multiple myeloma   . Spastic quadriparesis   . DVT (deep venous thrombosis)     right  . Pneumonia     hx of with last time in Nov 2012  . Joint pain   . Sleep apnea     CPAP- q night, [place] [facility]- 2 yrs.   . Prostate cancer     amyloidosis, multiple myeloma   . Arthritis     lumbar DDD  . Peripheral vascular disease     DVT- 01/2012, follows by Dr. Truett Perna, lovenox maintained since Spring 2013 , pt. aware that last dose is sch. for 07/22/2012  . Blood dyscrasia     plasma cell dyscrasia with associated amyloidosis   Related Meds:  Scheduled Meds:   . pantoprazole (PROTONIX) IV  40 mg Intravenous q morning - 10a  . piperacillin-tazobactam (ZOSYN)  IV  3.375 g Intravenous Q8H  . sodium chloride  10-40 mL Intracatheter Q12H   Continuous Infusions:   . sodium chloride 20 mL/hr (10/31/12 1315)  . [EXPIRED] sodium chloride 150 mL/hr at 10/30/12 1700   Followed by  . sodium chloride 100 mL/hr at 10/30/12 2326  . argatroban 1 mcg/kg/min (10/31/12 1314)  . TPN (CLINIMIX) +/- additives    . [DISCONTINUED] sodium chloride 150 mL/hr at 10/30/12 1226  . [DISCONTINUED] ADULT TPN     PRN Meds:.sodium chloride, acetaminophen, alum & mag  hydroxide-simeth, HYDROmorphone (DILAUDID) injection, LORazepam, ondansetron (ZOFRAN) IV, oxyCODONE, promethazine, sodium chloride  Ht: 6' 0.83" (185 cm) (as recorded earlier 12/6)  Wt: 188 lb 7.9 oz (85.5 kg)  Ideal Wt: 178 lb % Ideal Wt: 106  Usual Wt: 188-194 lb % Usual Wt: 97-100  Wt Readings from Last 10 Encounters:  10/27/12 188 lb 7.9 oz (85.5 kg)  10/27/12 188 lb 9.6 oz (85.548 kg)  10/26/12 188 lb 8 oz (85.503 kg)  08/11/12 190 lb 12.8 oz (86.546 kg)  07/31/12 194 lb 7.1 oz (88.2 kg)  07/31/12 194 lb 7.1 oz (88.2 kg)  07/20/12 194 lb 6.4 oz (88.179 kg)  07/20/12 194 lb 12.8 oz (88.361 kg)  06/26/12 189 lb 14.4 oz (86.138 kg)  05/29/12 187 lb 11.2 oz (85.14 kg)    Body mass index is 24.98 kg/(m^2).   Labs:  CMP     Component Value Date/Time   NA 136 10/31/2012 0540   K 4.1 10/31/2012 0540   CL 104 10/31/2012 0540   CO2 22 10/31/2012 0540   GLUCOSE 125* 10/31/2012 0540   BUN 46* 10/31/2012 0540   CREATININE 1.75* 10/31/2012 0540   CALCIUM 7.9* 10/31/2012 0540   PROT 5.2* 10/31/2012 0540   ALBUMIN 2.2* 10/31/2012 0540   AST 13 10/31/2012 0540   ALT 16 10/31/2012  0540   ALKPHOS 77 10/31/2012 0540   BILITOT 0.4 10/31/2012 0540   GFRNONAA 39* 10/31/2012 0540   GFRAA 45* 10/31/2012 0540    Intake/Output Summary (Last 24 hours) at 10/31/12 1351 Last data filed at 10/31/12 0900  Gross per 24 hour  Intake 2203.37 ml  Output   1180 ml  Net 1023.37 ml    Diet Order: NPO    IVF:    sodium chloride Last Rate: 20 mL/hr (10/31/12 1315)  [EXPIRED] sodium chloride Last Rate: 150 mL/hr at 10/30/12 1700  Followed by   sodium chloride Last Rate: 100 mL/hr at 10/30/12 2326  argatroban Last Rate: 1 mcg/kg/min (10/31/12 1314)  TPN (CLINIMIX) +/- additives   [DISCONTINUED] sodium chloride Last Rate: 150 mL/hr at 10/30/12 1226  [DISCONTINUED] ADULT TPN     Estimated Nutritional Needs:   Kcal:2150-2500 Protein:105-130g Fluid:2.1-2.5L  NUTRITION  DIAGNOSIS: -Inadequate oral intake (NI-2.1).  Status: Ongoing  RELATED TO: inability to eat  AS EVIDENCE BY: NPO  MONITORING/EVALUATION(Goals): TPN to meet >90% of estimated nutritional needs.   EDUCATION NEEDS: -No education needs identified at this time   Dietitian #: [phone]  DOCUMENTATION CODES Per approved criteria  -Not Applicable    Marshall Cork 10/31/2012, 1:41 PM

## 2012-10-31 NOTE — Progress Notes (Signed)
IP PROGRESS NOTE  Subjective:   The abdomen hurts when he coughs. He feels like he has to urinate. No flatus.  Objective: Vital signs in last 24 hours: Blood pressure 121/66, pulse 100, temperature 98 F (36.7 C), temperature source Oral, resp. rate 18, height 6' 0.84" (1.85 m), weight 188 lb 7.9 oz (85.5 kg), SpO2 98.00%.  Intake/Output from previous day: 12/09 0701 - 12/10 0700 In: 4405.9 [I.V.:3293.4; IV Piggyback:1112.5] Out: 980 [Urine:980]  Physical Exam:  Lungs: Inspiratory rhonchi at the bilateral posterior base, no respiratory distress Cardiac: Regular rate and rhythm Abdomen: Distended, bowel sounds are present Extremities: No leg edema Left arm PICC with a gauze dressing  Lab Results:  Basename 10/31/12 0540 10/30/12 0426  WBC 14.6* 21.4*  HGB 9.7* 9.4*  HCT 29.2* 28.1*  PLT 148* 130*    BMET  Basename 10/31/12 0540 10/30/12 0426  NA 136 136  K 4.1 4.1  CL 104 101  CO2 22 22  GLUCOSE 125* 152*  BUN 46* 39*  CREATININE 1.75* 1.90*  CALCIUM 7.9* 7.8*   PTT 82  Studies/Results: US Renal  10/30/2012  *RADIOLOGY REPORT*  Clinical Data: Acute renal failure question thrombosis, history of prostate cancer, multiple myeloma  RENAL/URINARY TRACT ULTRASOUND COMPLETE  Comparison:  CT abdomen 10/29/2012  Findings:  Right Kidney:  11.4 cm length. Normal cortical thickness.  Somewhat heterogeneous cortical echogenicity, nonspecific.  No mass or hydronephrosis.  Internal blood flow present on color Doppler imaging.  Left Kidney:  11.8 cm length.  Normal cortical thickness.  Slightly heterogeneous cortical echogenicity.  No mass or hydronephrosis. Internal blood flow present on color Doppler imaging.  Bladder:  Decompressed by Foley catheter, inadequately evaluated.  Ascites identified in upper abdomen. Liver demonstrates heterogeneous echogenicity.  IMPRESSION: Ascites. Heterogeneous renal cortices bilaterally, nonspecific appearance but could reflect medical renal disease.  No gross evidence of mass, hydronephrosis or absent blood flow.   Original Report Authenticated By: Ulyses Southward, M.D.    Ct Angio Abd/pel W/ And/or W/o  10/29/2012  *RADIOLOGY REPORT*  Clinical Data:  Mesenteric ischemia.  Nausea and vomiting. Abdominal pain.  Elevated white blood cell count.  CT ANGIOGRAPHY ABDOMEN AND PELVIS  Technique:  Multidetector CT imaging of the abdomen and pelvis was performed using the standard protocol during bolus administration of intravenous contrast.  Multiplanar reconstructed images including MIPs were obtained and reviewed to evaluate the vascular anatomy.  Contrast: OMNIPAQUE IOHEXOL 350 MG/ML SOLN  Comparison:  10/28/2012.  Findings:  Consolidation is present in the posterior right lower lobe, compatible with pneumonia.  Atelectasis in the left lower lobe.  Right small parapneumonic effusion is present.  Moderate volume of ascites is present around the liver and spleen. Small low-density lesion in the spleen is present, likely representing a small cyst.  Transient hepatic attenuation difference is present in the right hepatic lobe on arterial phase imaging.  Abdominal aortic atherosclerosis is present.  No acute aortic abnormality.  In the anatomic pelvis, there is high density material layering within the ascites in the pouch of Douglas, likely representing debris.  Clinically correlate for peritonitis.  There is no free air identified in the abdomen or pelvis.  Gallbladder is distended.  Gallstones are present in the gallbladder neck.  No common duct stone is identified.  Patulous gastroesophageal junction.  Stomach appears within normal limits. Duodenum normal.  The celiac axis is patent.  Superior mesenteric artery also appears patent.  There is thrombus in the superior mesenteric vein.  There is no  portal vein thrombosis.  The inferior mesenteric vein appears within normal limits.  Colonic diverticulosis is present.  Inflammatory changes of bowel are present in the  anatomic pelvis, likely associated with venous congestion and possibly venous infarction.  No pneumatosis at this time.  No portal venous gas.  In the liver, there is thrombus in the right hepatic lobe portal veins, demonstrated an area of low attenuation (image number 27 series 4).  The splenic vein and inferior mesenteric vein appear patent.  Iliofemoral atherosclerosis is present without aneurysm. Postoperative changes of the lumbar spine.  No aggressive osseous lesions. Posterior lumbar interbody fusion.  Diffuse mesenteric edema is present.   Review of the MIP images confirms the above findings.  IMPRESSION: 1.  Thrombus in the superior mesenteric vein.  Superior thrombus is present in the intrahepatic portal veins in the right hepatic lobe accounting for transient hepatic attenuation difference. 2. 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  No pneumatosis or portal venous gas however based on the inflammatory changes, venous infarction is probably developing. 3.  Moderate volume of ascites, with complex debris/fluid layering in the anatomic pelvis. 4.  No significant arterial occlusive disease. 5.  Right lower lobe consolidation compatible with pneumonia. 6.  Cholelithiasis.  No common duct stone.   Original Report Authenticated By: Andreas Newport, M.D.     Medications: I have reviewed the patient's current medications.  Assessment/Plan:  1. Acute pulmonary embolism-presumably from a right jugular DVT 2. Right neck DVT secondary to a pheresis catheter-hospitalized [facility] from 10/23/2012 through 10/25/2012 and treated with heparin/Lovenox  3. Thrombocytopenia-improved, likely related to the stem cell harvest procedure.HIT panel is pending  4. Right lower extremity deep vein thrombosis 02/02/2012, Doppler examination of the legs 10/28/2012 revealed no acute DVT. 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 5. Plasma cell dyscrasia/amyloidosis-scheduled for high-dose  chemotherapy and stem cell support next week. 6. Acute nausea/vomiting and abdominal pain on 10/28/2012. A CT 10/29/2012 confirmed and SMV/portal vein thrombosis with changes of bowel ischemia. Continue anticoagulation with Argatroban. He has an ileus. 7.? Diastolic heart murmur 8. acute renal failure-the urine output improved late yesterday. The creatinine is better this morning. Likely acute renal failure related to dehydration and IV contrast. 9. Nutrition-begin TNA today  The pain and nausea have improved, but he continues to have an ileus. The plan is to continue our Argatron anticoagulation while we await the HIT panel. I appreciate the consults from doctor's Derrell Lolling and [place].     LOS: 4 days   Brent Allison  10/31/2012, 10:39 AM

## 2012-10-31 NOTE — Progress Notes (Addendum)
Patient ID: Brent Allison, male   DOB: May 07, 1946, 66 y.o.   MRN: [id number]   [facility] Progress Note    Subjective:   Reports ongoing abdominal discomfort/distension and nausea. His Foley catheter was discontinued this morning due to intense discomfort.    Objective:   BP 123/58  Pulse 85  Temp 98.1 F (36.7 C) (Oral)  Resp 16  Ht 6' 0.84" (1.85 m)  Wt 85.5 kg (188 lb 7.9 oz)  BMI 24.98 kg/m2  SpO2 97%  Intake/Output Summary (Last 24 hours) at 10/31/12 1429 Last data filed at 10/31/12 1030  Gross per 24 hour  Intake 2035.77 ml  Output   1430 ml  Net 605.77 ml   Weight change:   Physical Exam: Gen: Appears to be relatively comfortable resting in bed-lying supine. Wife and daughter by bedside. CVS: Pulse regular in rate and rhythm, heart sounds S1 and S2 normal Resp: Coarse breath sounds right base otherwise clear to auscultation-no rales Abd: Soft, distended, tender over the epigastric and lower quadrants. Scant bowel sounds. Ext: One plus right lower extremity edema, trace left lower extremity edema  Imaging: US Renal  10/30/2012  *RADIOLOGY REPORT*  Clinical Data: Acute renal failure question thrombosis, history of prostate cancer, multiple myeloma  RENAL/URINARY TRACT ULTRASOUND COMPLETE  Comparison:  CT abdomen 10/29/2012  Findings:  Right Kidney:  11.4 cm length. Normal cortical thickness.  Somewhat heterogeneous cortical echogenicity, nonspecific.  No mass or hydronephrosis.  Internal blood flow present on color Doppler imaging.  Left Kidney:  11.8 cm length.  Normal cortical thickness.  Slightly heterogeneous cortical echogenicity.  No mass or hydronephrosis. Internal blood flow present on color Doppler imaging.  Bladder:  Decompressed by Foley catheter, inadequately evaluated.  Ascites identified in upper abdomen. Liver demonstrates heterogeneous echogenicity.  IMPRESSION: Ascites. Heterogeneous renal cortices bilaterally, nonspecific appearance but  could reflect medical renal disease. No gross evidence of mass, hydronephrosis or absent blood flow.   Original Report Authenticated By: Ulyses Southward, M.D.    Ct Angio Abd/pel W/ And/or W/o  10/29/2012  *RADIOLOGY REPORT*  Clinical Data:  Mesenteric ischemia.  Nausea and vomiting. Abdominal pain.  Elevated white blood cell count.  CT ANGIOGRAPHY ABDOMEN AND PELVIS  Technique:  Multidetector CT imaging of the abdomen and pelvis was performed using the standard protocol during bolus administration of intravenous contrast.  Multiplanar reconstructed images including MIPs were obtained and reviewed to evaluate the vascular anatomy.  Contrast: OMNIPAQUE IOHEXOL 350 MG/ML SOLN  Comparison:  10/28/2012.  Findings:  Consolidation is present in the posterior right lower lobe, compatible with pneumonia.  Atelectasis in the left lower lobe.  Right small parapneumonic effusion is present.  Moderate volume of ascites is present around the liver and spleen. Small low-density lesion in the spleen is present, likely representing a small cyst.  Transient hepatic attenuation difference is present in the right hepatic lobe on arterial phase imaging.  Abdominal aortic atherosclerosis is present.  No acute aortic abnormality.  In the anatomic pelvis, there is high density material layering within the ascites in the pouch of Douglas, likely representing debris.  Clinically correlate for peritonitis.  There is no free air identified in the abdomen or pelvis.  Gallbladder is distended.  Gallstones are present in the gallbladder neck.  No common duct stone is identified.  Patulous gastroesophageal junction.  Stomach appears within normal limits. Duodenum normal.  The celiac axis is patent.  Superior mesenteric artery also appears patent.  There is thrombus in  the superior mesenteric vein.  There is no portal vein thrombosis.  The inferior mesenteric vein appears within normal limits.  Colonic diverticulosis is present.  Inflammatory  changes of bowel are present in the anatomic pelvis, likely associated with venous congestion and possibly venous infarction.  No pneumatosis at this time.  No portal venous gas.  In the liver, there is thrombus in the right hepatic lobe portal veins, demonstrated an area of low attenuation (image number 27 series 4).  The splenic vein and inferior mesenteric vein appear patent.  Iliofemoral atherosclerosis is present without aneurysm. Postoperative changes of the lumbar spine.  No aggressive osseous lesions. Posterior lumbar interbody fusion.  Diffuse mesenteric edema is present.   Review of the MIP images confirms the above findings.  IMPRESSION: 1.  Thrombus in the superior mesenteric vein.  Superior thrombus is present in the intrahepatic portal veins in the right hepatic lobe accounting for transient hepatic attenuation difference. 2. 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  No pneumatosis or portal venous gas however based on the inflammatory changes, venous infarction is probably developing. 3.  Moderate volume of ascites, with complex debris/fluid layering in the anatomic pelvis. 4.  No significant arterial occlusive disease. 5.  Right lower lobe consolidation compatible with pneumonia. 6.  Cholelithiasis.  No common duct stone.   Original Report Authenticated By: Andreas Newport, M.D.     Labs: BMET  Lab 10/31/12 0540 10/30/12 0426 10/28/12 2253 10/27/12 1825  NA 136 136 136 139  K 4.1 4.1 3.6 3.6  CL 104 101 100 101  CO2 22 22 25 26   GLUCOSE 125* 152* 171* 109*  BUN 46* 39* 18 16  CREATININE 1.75* 1.90* 0.86 0.79  ALB -- -- -- --  CALCIUM 7.9* 7.8* 8.4 8.6  PHOS -- -- -- --   CBC  Lab 10/31/12 0540 10/30/12 0426 10/29/12 0430 10/28/12 2253  WBC 14.6* 21.4* 20.4* 12.9*  NEUTROABS -- 18.5* -- --  HGB 9.7* 9.4* 12.2* 10.4*  HCT 29.2* 28.1* 36.5* 31.5*  MCV 86.4 86.7 86.5 86.5  PLT 148* 130* 124* 91*    Medications:      . pantoprazole (PROTONIX) IV  40 mg  Intravenous q morning - 10a  . piperacillin-tazobactam (ZOSYN)  IV  3.375 g Intravenous Q8H  . sodium chloride  10-40 mL Intracatheter Q12H     Assessment/ Plan:   1. Acute renal failure: Nonoliguric. Appears to be predominantly contrast-induced nephropathy versus hemodynamically mediated acute kidney injury. His urinalysis findings are consistent with contrast-induced nephropathy (high specific gravity). Urine electrolytes pending. Renal ultrasound negative for any obstruction. Continue intravenous fluids for now fearing that with ileus he'll continue to have third spacing/reduction in renal flow. Continue to monitor closely. Current electrolytes and volume status not wanting of any acute intervention-dialysis. Anticipate renal recovery will be seeing soon given the stabilization versus subtle improvement of his creatinine.  2. Plasma cell dyscrasia/amyloidosis: Status post stem cell harvest, transplantation when more clinically stable.  3. Anemia: Suspect may be related to his underlying plasma cell dyscrasia, management per Dr. Truett Perna.  4. Right IJ thrombus, pulmonary embolus, SVC thrombus and portal vein thrombus: Currently on Argatroban with concerns for heparin-induced thrombocytopenia 5. Abdominal distention/ileus: The patient is currently n.p.o. and without any acute surgical needs per surgery notes. TPN planned to be started.   Zetta Bills, MD 10/31/2012, 2:29 PM

## 2012-10-31 NOTE — Progress Notes (Signed)
PARENTERAL NUTRITION CONSULT NOTE - INITIAL  Pharmacy Consult for TNA Indication: ileus  No Known Allergies  Patient Measurements: Height: 6' 0.83" (185 cm) (as recorded earlier 12/6) Weight: 188 lb 7.9 oz (85.5 kg) IBW/kg (Calculated) : 79.52   Vital Signs: Temp: 98 F (36.7 C) (12/10 1029) Temp src: Oral (12/10 0553) BP: 121/66 mmHg (12/10 1029) Pulse Rate: 100  (12/10 1029) Intake/Output from previous day: 12/09 0701 - 12/10 0700 In: 4405.9 [I.V.:3293.4; IV Piggyback:1112.5] Out: 980 [Urine:980] Intake/Output from this shift: Total I/O In: -  Out: 200 [Urine:200]  Labs:  [facility] 10/31/12 0540 10/30/12 0426 10/29/12 0900 10/29/12 0430  WBC 14.6* 21.4* -- 20.4*  HGB 9.7* 9.4* -- 12.2*  HCT 29.2* 28.1* -- 36.5*  PLT 148* 130* -- 124*  APTT 54* 79* 80* --  INR -- -- -- --     Basename 10/31/12 0540 10/30/12 0426 10/28/12 2253  NA 136 136 136  K 4.1 4.1 3.6  CL 104 101 100  CO2 22 22 25   GLUCOSE 125* 152* 171*  BUN 46* 39* 18  CREATININE 1.75* 1.90* 0.86  LABCREA -- -- --  CREAT24HRUR -- -- --  CALCIUM 7.9* 7.8* 8.4  MG -- -- --  PHOS -- -- --  PROT 5.2* 5.1* 5.8*  ALBUMIN 2.2* 2.2* 2.5*  AST 13 11 41*  ALT 16 21 44  ALKPHOS 77 88 131*  BILITOT 0.4 0.4 0.6  BILIDIR -- -- --  IBILI -- -- --  PREALBUMIN -- -- --  TRIG -- -- --  CHOLHDL -- -- --  CHOL -- -- --   Estimated Creatinine Clearance: 46.1 ml/min (by C-G formula based on Cr of 1.75).   No results found for this basename: GLUCAP:3 in the last 72 hours  Medical History: Past Medical History  Diagnosis Date  . Dyslipidemia   . H/O multiple myeloma   . Spastic quadriparesis   . DVT (deep venous thrombosis)     right  . Pneumonia     hx of with last time in Nov 2012  . Joint pain   . Sleep apnea     CPAP- q night, [place] [facility]- 2 yrs.   . Prostate cancer     amyloidosis, multiple myeloma   . Arthritis     lumbar DDD  . Peripheral vascular disease     DVT- 01/2012, follows by Dr.  Truett Perna, lovenox maintained since Spring 2013 , pt. aware that last dose is sch. for 07/22/2012  . Blood dyscrasia     plasma cell dyscrasia with associated amyloidosis    Medications:  Scheduled:    . pantoprazole (PROTONIX) IV  40 mg Intravenous q morning - 10a  . piperacillin-tazobactam (ZOSYN)  IV  3.375 g Intravenous Q8H  . sodium chloride  10-40 mL Intracatheter Q12H   Infusions:    . sodium chloride Stopped (10/30/12 1630)  . [EXPIRED] sodium chloride 150 mL/hr at 10/30/12 1700   Followed by  . sodium chloride 100 mL/hr at 10/30/12 2326  . argatroban 1 mcg/kg/min (10/31/12 0203)  . [DISCONTINUED] sodium chloride 150 mL/hr at 10/30/12 1226  . [DISCONTINUED] ADULT TPN      Insulin Requirements in the past 24 hours:  n/a  Current Nutrition:  NPO  Current IV fluids:  NS @ 100 ml/hr  Assessment: 66 yo male with plasma cell dyscrasia/amyloidosis scheduled for high dose chemo and stem cell support next week with acute N/V and abdominal pain with CT confirming SMV/portal vein thrombosis with  changes of bowel ischemia causing current ileus. Plan to start TNA per pharmacy per Dr. Truett Perna  Nutritional Goals:   Clinimix E 5/20 at a goal rate of 100 ml/hr to provide an average of 2318 kcal/day with lipids administered on MWF, 120 grams of protein per day   Plan: at 1800 tonight: 1) Start Clinimix E 5/20 at 40 ml/hr 2) Lipids on MWF only due to national shortage 3) Multivitamin and Trace Elements added to TNA on MWF only due to national shortages 4) Mon/Thursday standard TNA labs and labs tomorrow 5) Reduce IV fluids from 100 ml/hr to 60 ml/hr when TNA hangs tonight   Hessie Knows, PharmD, BCPS Pager [phone] 10/31/2012 11:22 AM     Hessie Knows, PharmD, BCPS Pager [phone] 10/31/2012 11:07 AM

## 2012-10-31 NOTE — Progress Notes (Signed)
Subjective: Patient is alert and in minimal distress. States he is still somewhat uncomfortable and distended but no vomiting. No stool. No flatus. Foley catheter inserted last night. Urine output adequate, but not great  No fever. No sustained tachycardia.WBC down to 14,600. Hemoglobin 9.7. Platelet count 148. B-met not done.   Objective: Vital signs in last 24 hours: Temp:  [97.9 F (36.6 C)-98.1 F (36.7 C)] 97.9 F (36.6 C) (12/10 0553) Pulse Rate:  [97-106] 106  (12/10 0553) Resp:  [16-19] 16  (12/10 0553) BP: (107-122)/(55-73) 122/73 mmHg (12/10 0553) SpO2:  [93 %-99 %] 97 % (12/10 0553) Last BM Date: 10/28/12  Intake/Output from previous day: 12/09 0701 - 12/10 0700 In: 3658.4 [I.V.:2595.9; IV Piggyback:1062.5] Out: 980 [Urine:980] Intake/Output this shift: Total I/O In: 465.3 [I.V.:465.3] Out: 600 [Urine:600]  General appearance: alert. Pleasant. Cooperative. Mental status normal. Minimal distress. GI: abdomen distended but soft. Minimal bowel sounds. Diffusely tender but no peritoneal signs.Lower midline scar healed. No hernia.  Lab Results:   Basename 10/31/12 0540 10/30/12 0426  WBC 14.6* 21.4*  HGB 9.7* 9.4*  HCT 29.2* 28.1*  PLT 148* 130*   BMET  Basename 10/30/12 0426 10/28/12 2253  NA 136 136  K 4.1 3.6  CL 101 100  CO2 22 25  GLUCOSE 152* 171*  BUN 39* 18  CREATININE 1.90* 0.86  CALCIUM 7.8* 8.4   PT/INR No results found for this basename: LABPROT:2,INR:2 in the last 72 hours ABG  Basename 10/30/12 1133  PHART 7.454*  HCO3 22.2    Studies/Results: US Renal  10/30/2012  *RADIOLOGY REPORT*  Clinical Data: Acute renal failure question thrombosis, history of prostate cancer, multiple myeloma  RENAL/URINARY TRACT ULTRASOUND COMPLETE  Comparison:  CT abdomen 10/29/2012  Findings:  Right Kidney:  11.4 cm length. Normal cortical thickness.  Somewhat heterogeneous cortical echogenicity, nonspecific.  No mass or hydronephrosis.  Internal blood  flow present on color Doppler imaging.  Left Kidney:  11.8 cm length.  Normal cortical thickness.  Slightly heterogeneous cortical echogenicity.  No mass or hydronephrosis. Internal blood flow present on color Doppler imaging.  Bladder:  Decompressed by Foley catheter, inadequately evaluated.  Ascites identified in upper abdomen. Liver demonstrates heterogeneous echogenicity.  IMPRESSION: Ascites. Heterogeneous renal cortices bilaterally, nonspecific appearance but could reflect medical renal disease. No gross evidence of mass, hydronephrosis or absent blood flow.   Original Report Authenticated By: Ulyses Southward, M.D.    Ct Angio Abd/pel W/ And/or W/o  10/29/2012  *RADIOLOGY REPORT*  Clinical Data:  Mesenteric ischemia.  Nausea and vomiting. Abdominal pain.  Elevated white blood cell count.  CT ANGIOGRAPHY ABDOMEN AND PELVIS  Technique:  Multidetector CT imaging of the abdomen and pelvis was performed using the standard protocol during bolus administration of intravenous contrast.  Multiplanar reconstructed images including MIPs were obtained and reviewed to evaluate the vascular anatomy.  Contrast: OMNIPAQUE IOHEXOL 350 MG/ML SOLN  Comparison:  10/28/2012.  Findings:  Consolidation is present in the posterior right lower lobe, compatible with pneumonia.  Atelectasis in the left lower lobe.  Right small parapneumonic effusion is present.  Moderate volume of ascites is present around the liver and spleen. Small low-density lesion in the spleen is present, likely representing a small cyst.  Transient hepatic attenuation difference is present in the right hepatic lobe on arterial phase imaging.  Abdominal aortic atherosclerosis is present.  No acute aortic abnormality.  In the anatomic pelvis, there is high density material layering within the ascites in the pouch of Clay,  likely representing debris.  Clinically correlate for peritonitis.  There is no free air identified in the abdomen or pelvis.  Gallbladder  is distended.  Gallstones are present in the gallbladder neck.  No common duct stone is identified.  Patulous gastroesophageal junction.  Stomach appears within normal limits. Duodenum normal.  The celiac axis is patent.  Superior mesenteric artery also appears patent.  There is thrombus in the superior mesenteric vein.  There is no portal vein thrombosis.  The inferior mesenteric vein appears within normal limits.  Colonic diverticulosis is present.  Inflammatory changes of bowel are present in the anatomic pelvis, likely associated with venous congestion and possibly venous infarction.  No pneumatosis at this time.  No portal venous gas.  In the liver, there is thrombus in the right hepatic lobe portal veins, demonstrated an area of low attenuation (image number 27 series 4).  The splenic vein and inferior mesenteric vein appear patent.  Iliofemoral atherosclerosis is present without aneurysm. Postoperative changes of the lumbar spine.  No aggressive osseous lesions. Posterior lumbar interbody fusion.  Diffuse mesenteric edema is present.   Review of the MIP images confirms the above findings.  IMPRESSION: 1.  Thrombus in the superior mesenteric vein.  Superior thrombus is present in the intrahepatic portal veins in the right hepatic lobe accounting for transient hepatic attenuation difference. 2. 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  No pneumatosis or portal venous gas however based on the inflammatory changes, venous infarction is probably developing. 3.  Moderate volume of ascites, with complex debris/fluid layering in the anatomic pelvis. 4.  No significant arterial occlusive disease. 5.  Right lower lobe consolidation compatible with pneumonia. 6.  Cholelithiasis.  No common duct stone.   Original Report Authenticated By: Andreas Newport, M.D.     Anti-infectives: Anti-infectives     Start     Dose/Rate Route Frequency Ordered Stop   10/30/12 1015  piperacillin-tazobactam (ZOSYN) IVPB  3.375 g       3.375 g 12.5 mL/hr over 240 Minutes Intravenous Every 8 hours 10/30/12 1005            Assessment/Plan:  Acute thrombosis SMV and PV. Ileus and mesenteric edema and small bowel edema secondary to this.No evidence of bowel infarction at this time. Recommend bowel rest, anticoagulation, and IV antibiotics.  Questionable HIT.  Defer assessment of potential underlying hypercoagulable state to Dr. Truett Perna.  Defer to Dr. Truett Perna as to whether argatroban is as effective in this setting as other antithrombolic agents, such as lovenox.  Elevated creatinine. Question prerenal. Question contrast-related. Will check b- met. Continue to hydrate.  Amyloidosis, possible multiple myeloma Recent pulmonary embolism Recent DVT of right IJ at pheresis catheter site   LOS: 4 days    Amyria Komar M. Derrell Lolling, M.D., [facility] General and Minimally invasive Surgery Breast and Colorectal Surgery Office:   [phone] Pager:   [phone]  10/31/2012

## 2012-10-31 NOTE — Progress Notes (Signed)
ANTICOAGULATION CONSULT NOTE - Follow Up Consult  Pharmacy Consult for Argatroban Indication: pulmonary embolus  No Known Allergies  Patient Measurements: Height: 6' 0.83" (185 cm) (as recorded earlier 12/6) Weight: 188 lb 7.9 oz (85.5 kg) IBW/kg (Calculated) : 79.52   Vital Signs: Temp: 97.9 F (36.6 C) (12/10 0553) Temp src: Oral (12/10 0553) BP: 122/73 mmHg (12/10 0553) Pulse Rate: 106  (12/10 0553)  Labs:  Basename 10/31/12 0540 10/30/12 0426 10/29/12 0900 10/29/12 0430 10/28/12 2253 10/28/12 1555 10/28/12 0857  HGB 9.7* 9.4* -- -- -- -- --  HCT 29.2* 28.1* -- 36.5* -- -- --  PLT 148* 130* -- 124* -- -- --  APTT 54* 79* 80* -- -- -- --  LABPROT -- -- -- -- -- -- --  INR -- -- -- -- -- -- --  HEPARINUNFRC -- -- -- -- 0.23* 0.24* 0.26*  CREATININE 1.75* 1.90* -- -- 0.86 -- --  CKTOTAL -- -- -- -- -- -- --  CKMB -- -- -- -- -- -- --  TROPONINI -- -- -- -- -- -- --    Estimated Creatinine Clearance: 46.1 ml/min (by C-G formula based on Cr of 1.75).  Medications:  Scheduled:     . pantoprazole (PROTONIX) IV  40 mg Intravenous q morning - 10a  . piperacillin-tazobactam (ZOSYN)  IV  3.375 g Intravenous Q8H  . [COMPLETED] sodium chloride  1,000 mL Intravenous Once  . sodium chloride  10-40 mL Intracatheter Q12H   Infusions:     . sodium chloride Stopped (10/30/12 1630)  . [EXPIRED] sodium chloride 150 mL/hr at 10/30/12 1700   Followed by  . sodium chloride 100 mL/hr at 10/30/12 2326  . argatroban 1 mcg/kg/min (10/31/12 0203)  . [DISCONTINUED] sodium chloride 150 mL/hr at 10/30/12 1226    Assessment: 66 yom with h/o plasma cell dyscrasia, newly diagnosed R internal jugular clot 10/22/12 (2/2 pheresis catheter) treated with LMWH outpt.  Pt admit with acute PE and started on IV heparin 12/6. Initially thrombocytopenia through to be 2/2 to recent pheresis, but now low clinical suspicion for HIT.  Changed IV heparin to Argatroban on 12/8, HIT panel ordered.  Today PLT  improved some to 148K Pt's c/o abdominal pain concerning for bowel ischemia per MD notes. APTT therapeutic today (54 sec) No complications of therapy noted  Goal of Therapy:  aPTT 50-90 seconds Monitor platelets by anticoagulation protocol: Yes   Plan:   Continue Argatroban IV infusion at 5.1 ml/hr.  Daily CBC and aPTT.  Follow up HIT panel (currently in process)   Hessie Knows, PharmD, BCPS Pager [phone] 10/31/2012 8:09 AM

## 2012-11-01 DIAGNOSIS — R110 Nausea: Secondary | ICD-10-CM

## 2012-11-01 LAB — CBC
HCT: 25.8 % — ABNORMAL LOW (ref 39.0–52.0)
Hemoglobin: 8.4 g/dL — ABNORMAL LOW (ref 13.0–17.0)
MCH: 28.5 pg (ref 26.0–34.0)
MCHC: 32.6 g/dL (ref 30.0–36.0)
MCV: 87.5 fL (ref 78.0–100.0)
Platelets: 168 10*3/uL (ref 150–400)
RBC: 2.95 MIL/uL — ABNORMAL LOW (ref 4.22–5.81)
RDW: 15.7 % — ABNORMAL HIGH (ref 11.5–15.5)
WBC: 9.8 10*3/uL (ref 4.0–10.5)

## 2012-11-01 LAB — COMPREHENSIVE METABOLIC PANEL
ALT: 13 U/L (ref 0–53)
AST: 14 U/L (ref 0–37)
Albumin: 2 g/dL — ABNORMAL LOW (ref 3.5–5.2)
Alkaline Phosphatase: 67 U/L (ref 39–117)
BUN: 44 mg/dL — ABNORMAL HIGH (ref 6–23)
CO2: 24 mEq/L (ref 19–32)
Calcium: 8.3 mg/dL — ABNORMAL LOW (ref 8.4–10.5)
Chloride: 106 mEq/L (ref 96–112)
Creatinine, Ser: 1.64 mg/dL — ABNORMAL HIGH (ref 0.50–1.35)
GFR calc Af Amer: 48 mL/min — ABNORMAL LOW (ref >90)
GFR calc non Af Amer: 42 mL/min — ABNORMAL LOW (ref >90)
Glucose, Bld: 135 mg/dL — ABNORMAL HIGH (ref 70–99)
Potassium: 3.5 mEq/L (ref 3.5–5.1)
Sodium: 139 mEq/L (ref 135–145)
Total Bilirubin: 0.4 mg/dL (ref 0.3–1.2)
Total Protein: 5 g/dL — ABNORMAL LOW (ref 6.0–8.3)

## 2012-11-01 LAB — DIFFERENTIAL
Basophils Absolute: 0 10*3/uL (ref 0.0–0.1)
Basophils Relative: 0 % (ref 0–1)
Eosinophils Absolute: 0 10*3/uL (ref 0.0–0.7)
Eosinophils Relative: 0 % (ref 0–5)
Lymphocytes Relative: 6 % — ABNORMAL LOW (ref 12–46)
Lymphs Abs: 0.6 10*3/uL — ABNORMAL LOW (ref 0.7–4.0)
Monocytes Absolute: 1.3 10*3/uL — ABNORMAL HIGH (ref 0.1–1.0)
Monocytes Relative: 13 % — ABNORMAL HIGH (ref 3–12)
Neutro Abs: 7.9 10*3/uL — ABNORMAL HIGH (ref 1.7–7.7)
Neutrophils Relative %: 81 % — ABNORMAL HIGH (ref 43–77)

## 2012-11-01 LAB — GLUCOSE, CAPILLARY
Glucose-Capillary: 124 mg/dL — ABNORMAL HIGH (ref 70–99)
Glucose-Capillary: 133 mg/dL — ABNORMAL HIGH (ref 70–99)
Glucose-Capillary: 144 mg/dL — ABNORMAL HIGH (ref 70–99)
Glucose-Capillary: 145 mg/dL — ABNORMAL HIGH (ref 70–99)

## 2012-11-01 LAB — PHOSPHORUS: Phosphorus: 2.5 mg/dL (ref 2.3–4.6)

## 2012-11-01 LAB — APTT
aPTT: 53 seconds — ABNORMAL HIGH (ref 24–37)
aPTT: 76 seconds — ABNORMAL HIGH (ref 24–37)

## 2012-11-01 LAB — TRIGLYCERIDES: Triglycerides: 74 mg/dL (ref <150)

## 2012-11-01 LAB — CHOLESTEROL, TOTAL: Cholesterol: 111 mg/dL (ref 0–200)

## 2012-11-01 LAB — PREALBUMIN: Prealbumin: 6 mg/dL — ABNORMAL LOW (ref 17.0–34.0)

## 2012-11-01 LAB — MAGNESIUM: Magnesium: 2.4 mg/dL (ref 1.5–2.5)

## 2012-11-01 MED ORDER — INSULIN ASPART 100 UNIT/ML ~~LOC~~ SOLN
0.0000 [IU] | Freq: Four times a day (QID) | SUBCUTANEOUS | Status: DC
  Administered 2012-11-01: 1 [IU] via SUBCUTANEOUS
  Administered 2012-11-02: 2 [IU] via SUBCUTANEOUS
  Administered 2012-11-02 – 2012-11-06 (×10): 1 [IU] via SUBCUTANEOUS

## 2012-11-01 MED ORDER — TRACE MINERALS CR-CU-F-FE-I-MN-MO-SE-ZN IV SOLN
INTRAVENOUS | Status: AC
  Administered 2012-11-01: 18:00:00 via INTRAVENOUS
  Filled 2012-11-01: qty 2000

## 2012-11-01 MED ORDER — FAT EMULSION 20 % IV EMUL
250.0000 mL | INTRAVENOUS | Status: AC
  Administered 2012-11-01: 250 mL via INTRAVENOUS
  Filled 2012-11-01: qty 250

## 2012-11-01 NOTE — Progress Notes (Signed)
PARENTERAL NUTRITION CONSULT NOTE - FOLLOW UP  Pharmacy Consult for TNA Indication: Ileus  No Known Allergies  Patient Measurements: Height: 6' 0.83" (185 cm) (as recorded earlier 12/6) Weight: 188 lb 7.9 oz (85.5 kg) IBW/kg (Calculated) : 79.52  Adjusted Body Weight: 80 kg Usual Weight: 188-194 lb  Vital Signs: Temp: 98.2 F (36.8 C) (12/11 0525) Temp src: Oral (12/11 0525) BP: 117/65 mmHg (12/11 0525) Pulse Rate: 73  (12/11 0525) Intake/Output from previous day: 12/10 0701 - 12/11 0700 In: -  Out: 1500 [Urine:1500] Intake/Output from this shift:   Labs:  [facility] 11/01/12 0500 10/31/12 0540 10/30/12 0426  WBC 9.8 14.6* 21.4*  HGB 8.4* 9.7* 9.4*  HCT 25.8* 29.2* 28.1*  PLT 168 148* 130*  APTT 53* 54* 79*  INR -- -- --    Basename 11/01/12 0500 10/31/12 0540 10/30/12 1627 10/30/12 0426  NA 139 136 -- 136  K 3.5 4.1 -- 4.1  CL 106 104 -- 101  CO2 24 22 -- 22  GLUCOSE 135* 125* -- 152*  BUN 44* 46* -- 39*  CREATININE 1.64* 1.75* -- 1.90*  LABCREA -- -- 179.2 --  CREAT24HRUR -- -- -- --  CALCIUM 8.3* 7.9* -- 7.8*  MG 2.4 -- -- --  PHOS 2.5 -- -- --  PROT 5.0* 5.2* -- 5.1*  ALBUMIN 2.0* 2.2* -- 2.2*  AST 14 13 -- 11  ALT 13 16 -- 21  ALKPHOS 67 77 -- 88  BILITOT 0.4 0.4 -- 0.4  BILIDIR -- -- -- --  IBILI -- -- -- --  PREALBUMIN -- -- -- --  TRIG 74 -- -- --  CHOLHDL -- -- -- --  CHOL 111 -- -- --   Estimated Creatinine Clearance: 49.1 ml/min (by C-G formula based on Cr of 1.64).   No results found for this basename: GLUCAP:3 in the last 72 hours  Medications:  Scheduled:    . pantoprazole (PROTONIX) IV  40 mg Intravenous q morning - 10a  . piperacillin-tazobactam (ZOSYN)  IV  3.375 g Intravenous Q8H  . sodium chloride  10-40 mL Intracatheter Q12H    Insulin Requirements in the past 24 hours:  None ordered, will begin CBG q6hr, with sensitive scale  Current Nutrition: NPO Clinimix E 5/20 at 40 ml/hr  Assessment: 66 yo male with plasma cell  dyscrasia/amyloidosis scheduled for high dose chemo and stem cell support next week with acute N/V and abdominal pain with CT confirming SMV/portal vein thrombosis with changes of bowel ischemia causing current ileus.  Lytes:wnl, Corrected Ca 9.5 Renal: Cl ~ 45 ml/min TG: 74 PreAlb: pending IV fluid: NS @ 60 ml/hr  Nutritional Goals:  Per RD: 2150-2500 kCal, 105-130 grams of protein per day Goal rate Clinimix E 5/20 at 100 ml/hr will deliver per day: 120 gm protein Avg 2300 Kcal/day *Lipids, MVI, Trace Elements only on MWF due to national back order.  Plan:  Increase Clinimix E 5/20 to 79ml/hr Reduce IV to 50 ml/lhr Full TNA labs Mon/Thurs Add CBG q6hr and insulin per sensitive scale.  Otho Bellows PharmD Pager [phone] 11/01/2012,8:56 AM

## 2012-11-01 NOTE — Progress Notes (Signed)
ANTICOAGULATION CONSULT NOTE - Follow Up Consult  Pharmacy Consult for Argatroban Indication: pulmonary embolus  No Known Allergies  Patient Measurements: Height: 6' 0.83" (185 cm) (as recorded earlier 12/6) Weight: 188 lb 7.9 oz (85.5 kg) IBW/kg (Calculated) : 79.52   Vital Signs: Temp: 98.2 F (36.8 C) (12/11 1506) Temp src: Oral (12/11 1506) BP: 118/68 mmHg (12/11 1506) Pulse Rate: 96  (12/11 1506)  Labs:  Basename 11/01/12 1545 11/01/12 0500 10/31/12 0540 10/30/12 0426  HGB -- 8.4* 9.7* --  HCT -- 25.8* 29.2* 28.1*  PLT -- 168 148* 130*  APTT 76* 53* 54* --  LABPROT -- -- -- --  INR -- -- -- --  HEPARINUNFRC -- -- -- --  CREATININE -- 1.64* 1.75* 1.90*  CKTOTAL -- -- -- --  CKMB -- -- -- --  TROPONINI -- -- -- --   Estimated Creatinine Clearance: 49.1 ml/min (by C-G formula based on Cr of 1.64).  Medications:  Scheduled:     . insulin aspart  0-9 Units Subcutaneous Q6H  . pantoprazole (PROTONIX) IV  40 mg Intravenous q morning - 10a  . piperacillin-tazobactam (ZOSYN)  IV  3.375 g Intravenous Q8H  . sodium chloride  10-40 mL Intracatheter Q12H   Infusions:     . sodium chloride 20 mL/hr (10/31/12 1315)  . sodium chloride 60 mL/hr at 10/31/12 1125  . argatroban 1 mcg/kg/min (11/01/12 1458)  . TPN (CLINIMIX) +/- additives     And  . fat emulsion    . TPN (CLINIMIX) +/- additives 40 mL/hr at 10/31/12 1740    Assessment: 66 yom with h/o plasma cell dyscrasia, newly diagnosed R internal jugular clot 10/22/12 (2/2 pheresis catheter) treated with LMWH outpt.  Pt admit with acute PE and started on IV heparin 12/6. Initially thrombocytopenia through to be 2/2 to recent pheresis, but now low clinical suspicion for HIT.  Changed IV heparin to Argatroban on 12/8, HIT panel ordered-results pending. PLT continue to improve; 168K today Pt's c/o abdominal pain concerning for bowel ischemia-but no sign infarct per notes APTT therapeutic today (53 sec) No complications  of therapy noted In room this am with RN to verify rate: Argatroban not infusing. Replacement vial on floor, resumed at 5.1 ml/hr. Level recheck 12/11 PM following restart of argatroban, aPTT = 76 sec  Goal of Therapy:  aPTT 50-90 seconds Monitor platelets by anticoagulation protocol: Yes   Plan:   Continue Argatroban IV infusion at 5.1 ml/hr as recheck aPTT is within desired range.  Daily CBC and aPTT.  Follow up HIT panel (currently in process)  Juliette Alcide, PharmD, BCPS.   Pager: [phone] 11/01/2012 5:17 PM

## 2012-11-01 NOTE — Progress Notes (Signed)
IP PROGRESS NOTE  Subjective:   He is not passing flatus. Nausea when he moves.  Objective: Vital signs in last 24 hours: Blood pressure 121/67, pulse 90, temperature 97.7 F (36.5 C), temperature source Oral, resp. rate 20, height 6' 0.84" (1.85 m), weight 188 lb 7.9 oz (85.5 kg), SpO2 97.00%.  Intake/Output from previous day: 12/10 0701 - 12/11 0700 In: -  Out: 1500 [Urine:1500]  Physical Exam:  Lungs: Clear bilaterally Cardiac: Regular rate and rhythm Abdomen: Distended, diminished bowel sounds Extremities: No leg edema Left arm PICC without erythema  Lab Results:  Basename 11/01/12 0500 10/31/12 0540  WBC 9.8 14.6*  HGB 8.4* 9.7*  HCT 25.8* 29.2*  PLT 168 148*    BMET  Basename 11/01/12 0500 10/31/12 0540  NA 139 136  K 3.5 4.1  CL 106 104  CO2 24 22  GLUCOSE 135* 125*  BUN 44* 46*  CREATININE 1.64* 1.75*  CALCIUM 8.3* 7.9*   PTT 53  Studies/Results: US Renal  10/30/2012  *RADIOLOGY REPORT*  Clinical Data: Acute renal failure question thrombosis, history of prostate cancer, multiple myeloma  RENAL/URINARY TRACT ULTRASOUND COMPLETE  Comparison:  CT abdomen 10/29/2012  Findings:  Right Kidney:  11.4 cm length. Normal cortical thickness.  Somewhat heterogeneous cortical echogenicity, nonspecific.  No mass or hydronephrosis.  Internal blood flow present on color Doppler imaging.  Left Kidney:  11.8 cm length.  Normal cortical thickness.  Slightly heterogeneous cortical echogenicity.  No mass or hydronephrosis. Internal blood flow present on color Doppler imaging.  Bladder:  Decompressed by Foley catheter, inadequately evaluated.  Ascites identified in upper abdomen. Liver demonstrates heterogeneous echogenicity.  IMPRESSION: Ascites. Heterogeneous renal cortices bilaterally, nonspecific appearance but could reflect medical renal disease. No gross evidence of mass, hydronephrosis or absent blood flow.   Original Report Authenticated By: Ulyses Southward, M.D.      Medications: I have reviewed the patient's current medications.  Assessment/Plan:  1. Acute pulmonary embolism-presumably from a right jugular DVT, now on Argatroban 2. Right neck DVT secondary to a pheresis catheter-hospitalized at [facility] from 10/23/2012 through 10/25/2012 and treated with heparin/Lovenox  3. Thrombocytopenia-improved, likely related to the stem cell harvest procedure versus HIT 4. Right lower extremity deep vein thrombosis 02/02/2012, Doppler examination of the legs 10/28/2012 revealed no acute DVT. 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 5. Plasma cell dyscrasia/amyloidosis-scheduled for high-dose chemotherapy and stem cell support next week. 6. Acute nausea/vomiting and abdominal pain on 10/28/2012. A CT 10/29/2012 confirmed and SMV/portal vein thrombosis with changes of bowel ischemia. Continue anticoagulation with Argatroban. The ileus persist. 7.? Diastolic heart murmur 8. acute renal failure-the urine output improved late yesterday. The creatinine is improved and he is urinating. Likely contrast and dehydration related nephropathy  9. Nutrition-TNA started 10/31/2012  He appears stable. There is a persistent ileus. I discussed the case with Dr. Derrell Lolling. Antibiotics will be continued for now. I have contacted the lab to request the results of the HIT panel.     LOS: 5 days   Johnmichael Melhorn  11/01/2012, 10:08 AM

## 2012-11-01 NOTE — Progress Notes (Signed)
ANTICOAGULATION CONSULT NOTE - Follow Up Consult  Pharmacy Consult for Argatroban Indication: pulmonary embolus  No Known Allergies  Patient Measurements: Height: 6' 0.83" (185 cm) (as recorded earlier 12/6) Weight: 188 lb 7.9 oz (85.5 kg) IBW/kg (Calculated) : 79.52   Vital Signs: Temp: 97.7 F (36.5 C) (12/11 0955) Temp src: Oral (12/11 0955) BP: 121/67 mmHg (12/11 0955) Pulse Rate: 90  (12/11 0955)  Labs:  Basename 11/01/12 0500 10/31/12 0540 10/30/12 0426  HGB 8.4* 9.7* --  HCT 25.8* 29.2* 28.1*  PLT 168 148* 130*  APTT 53* 54* 79*  LABPROT -- -- --  INR -- -- --  HEPARINUNFRC -- -- --  CREATININE 1.64* 1.75* 1.90*  CKTOTAL -- -- --  CKMB -- -- --  TROPONINI -- -- --   Estimated Creatinine Clearance: 49.1 ml/min (by C-G formula based on Cr of 1.64).  Medications:  Scheduled:     . insulin aspart  0-9 Units Subcutaneous Q6H  . pantoprazole (PROTONIX) IV  40 mg Intravenous q morning - 10a  . piperacillin-tazobactam (ZOSYN)  IV  3.375 g Intravenous Q8H  . sodium chloride  10-40 mL Intracatheter Q12H   Infusions:     . sodium chloride 20 mL/hr (10/31/12 1315)  . sodium chloride 60 mL/hr at 10/31/12 1125  . argatroban 1 mcg/kg/min (11/01/12 0272)  . TPN (CLINIMIX) +/- additives     And  . fat emulsion    . TPN (CLINIMIX) +/- additives 40 mL/hr at 10/31/12 1740  . [DISCONTINUED] ADULT TPN      Assessment: 57 yom with h/o plasma cell dyscrasia, newly diagnosed R internal jugular clot 10/22/12 (2/2 pheresis catheter) treated with LMWH outpt.  Pt admit with acute PE and started on IV heparin 12/6. Initially thrombocytopenia through to be 2/2 to recent pheresis, but now low clinical suspicion for HIT.  Changed IV heparin to Argatroban on 12/8, HIT panel ordered-results pending. PLT continue to improve; 168K today Pt's c/o abdominal pain concerning for bowel ischemia-but no sign infarct per notes APTT therapeutic today (53 sec) No complications of therapy  noted In room this am with RN to verify rate: Argatroban not infusing. Replacement vial on floor, resumed at 5.1 ml/hr.  Goal of Therapy:  aPTT 50-90 seconds Monitor platelets by anticoagulation protocol: Yes   Plan:   Continue Argatroban IV infusion at 5.1 ml/hr.  Daily CBC and aPTT.  Follow up HIT panel (currently in process)  Re-check aPTT at [place] PharmD Pager [phone] 11/01/2012 10:10 AM

## 2012-11-01 NOTE — Progress Notes (Signed)
Subjective: Overall he feels better. Foley catheter and out, vomiting, comfortable with that. One episode of nausea and vomiting yesterday, a low volume according to him. Abdominal pain better. Still quite distended. No stool or flatus.  Started on TNA yesterday.  WBC down to 9800. Hemoglobin 8.4. Platelet count 168K.    Objective: Vital signs in last 24 hours: Temp:  [97.5 F (36.4 C)-98.2 F (36.8 C)] 98.2 F (36.8 C) (12/11 0525) Pulse Rate:  [73-100] 73  (12/11 0525) Resp:  [16-20] 18  (12/11 0525) BP: (115-123)/(58-72) 117/65 mmHg (12/11 0525) SpO2:  [96 %-98 %] 98 % (12/11 0525) FiO2 (%):  [11 %] 11 % (12/10 2104) Last BM Date: 10/28/12  Intake/Output from previous day: 12/10 0701 - 12/11 0700 In: -  Out: 1500 [Urine:1500] Intake/Output this shift: Total I/O In: -  Out: 725 [Urine:725]  General appearance: alert. Appropriate. In no distress. Mental status normal, although somewhat depressed. GI: abdomen still distended, minimal bowel sounds, soft, mild diffuse tenderness, no peritoneal signs. Lower midline scar well healed without hernia  Lab Results:   Basename 11/01/12 0500 10/31/12 0540  WBC 9.8 14.6*  HGB 8.4* 9.7*  HCT 25.8* 29.2*  PLT 168 148*   BMET  Basename 10/31/12 0540 10/30/12 0426  NA 136 136  K 4.1 4.1  CL 104 101  CO2 22 22  GLUCOSE 125* 152*  BUN 46* 39*  CREATININE 1.75* 1.90*  CALCIUM 7.9* 7.8*   PT/INR No results found for this basename: LABPROT:2,INR:2 in the last 72 hours ABG  Basename 10/30/12 1133  PHART 7.454*  HCO3 22.2    Studies/Results: US Renal  10/30/2012  *RADIOLOGY REPORT*  Clinical Data: Acute renal failure question thrombosis, history of prostate cancer, multiple myeloma  RENAL/URINARY TRACT ULTRASOUND COMPLETE  Comparison:  CT abdomen 10/29/2012  Findings:  Right Kidney:  11.4 cm length. Normal cortical thickness.  Somewhat heterogeneous cortical echogenicity, nonspecific.  No mass or hydronephrosis.   Internal blood flow present on color Doppler imaging.  Left Kidney:  11.8 cm length.  Normal cortical thickness.  Slightly heterogeneous cortical echogenicity.  No mass or hydronephrosis. Internal blood flow present on color Doppler imaging.  Bladder:  Decompressed by Foley catheter, inadequately evaluated.  Ascites identified in upper abdomen. Liver demonstrates heterogeneous echogenicity.  IMPRESSION: Ascites. Heterogeneous renal cortices bilaterally, nonspecific appearance but could reflect medical renal disease. No gross evidence of mass, hydronephrosis or absent blood flow.   Original Report Authenticated By: Ulyses Southward, M.D.     Anti-infectives: Anti-infectives     Start     Dose/Rate Route Frequency Ordered Stop   10/30/12 1015  piperacillin-tazobactam (ZOSYN) IVPB 3.375 g       3.375 g 12.5 mL/hr over 240 Minutes Intravenous Every 8 hours 10/30/12 1005            Assessment/Plan:   Acute mesenteric venous thrombosis SMV and PV. Ileus and mesenteric edema and small bowel edema secondary to this.No evidence of bowel infarction at this time. Resolution of leukocytosis is encouraging. Recommend bowel rest, anticoagulation, and IV antibiotics. Agree with TNA, as the ileus is likely to resolve slowly.  Questionable HIT.   Defer assessment of potential underlying hypercoagulable state to Dr. Truett Perna.    Amyloidosis, possible multiple myeloma  Recent pulmonary embolism  Recent DVT of right IJ at pheresis catheter site    LOS: 5 days    Fatuma Dowers M. Derrell Lolling, M.D., [facility] General and Minimally invasive Surgery Breast and Colorectal Surgery Office:  [phone] Pager:   [phone]  11/01/2012

## 2012-11-01 NOTE — Progress Notes (Signed)
Patient ID: Brent Allison, male   DOB: 03/17/1946, 66 y.o.   MRN: [id number]   [facility] Progress Note    Subjective:   Continues to struggle with abdominal distension, bloating and discomfort- still without flatus or BM.   Objective:   BP 121/67  Pulse 90  Temp 97.7 F (36.5 C) (Oral)  Resp 20  Ht 6' 0.84" (1.85 m)  Wt 85.5 kg (188 lb 7.9 oz)  BMI 24.98 kg/m2  SpO2 97%  Intake/Output Summary (Last 24 hours) at 11/01/12 1219 Last data filed at 11/01/12 1059  Gross per 24 hour  Intake      0 ml  Output   1550 ml  Net  -1550 ml   Weight change:   Physical Exam: ONG:[id number] resting supine in bed UUV:OZDGU RRR, normal S1 and S2  Resp: Diminished BS bibasally, no rales/rhonchi YQI:HKVQ, distended, no rebound, scant high pitched BS. QVZ:DGLOV LE edema  Imaging: US Renal  10/30/2012  *RADIOLOGY REPORT*  Clinical Data: Acute renal failure question thrombosis, history of prostate cancer, multiple myeloma  RENAL/URINARY TRACT ULTRASOUND COMPLETE  Comparison:  CT abdomen 10/29/2012  Findings:  Right Kidney:  11.4 cm length. Normal cortical thickness.  Somewhat heterogeneous cortical echogenicity, nonspecific.  No mass or hydronephrosis.  Internal blood flow present on color Doppler imaging.  Left Kidney:  11.8 cm length.  Normal cortical thickness.  Slightly heterogeneous cortical echogenicity.  No mass or hydronephrosis. Internal blood flow present on color Doppler imaging.  Bladder:  Decompressed by Foley catheter, inadequately evaluated.  Ascites identified in upper abdomen. Liver demonstrates heterogeneous echogenicity.  IMPRESSION: Ascites. Heterogeneous renal cortices bilaterally, nonspecific appearance but could reflect medical renal disease. No gross evidence of mass, hydronephrosis or absent blood flow.   Original Report Authenticated By: Ulyses Southward, M.D.     Labs: BMET  Lab 11/01/12 0500 10/31/12 0540 10/30/12 0426 10/28/12 2253 10/27/12 1825  NA  139 136 136 136 139  K 3.5 4.1 4.1 3.6 3.6  CL 106 104 101 100 101  CO2 24 22 22 25 26   GLUCOSE 135* 125* 152* 171* 109*  BUN 44* 46* 39* 18 16  CREATININE 1.64* 1.75* 1.90* 0.86 0.79  ALB -- -- -- -- --  CALCIUM 8.3* 7.9* 7.8* 8.4 8.6  PHOS 2.5 -- -- -- --   CBC  Lab 11/01/12 0500 10/31/12 0540 10/30/12 0426 10/29/12 0430  WBC 9.8 14.6* 21.4* 20.4*  NEUTROABS 7.9* -- 18.5* --  HGB 8.4* 9.7* 9.4* 12.2*  HCT 25.8* 29.2* 28.1* 36.5*  MCV 87.5 86.4 86.7 86.5  PLT 168 148* 130* 124*    Medications:      . insulin aspart  0-9 Units Subcutaneous Q6H  . pantoprazole (PROTONIX) IV  40 mg Intravenous q morning - 10a  . piperacillin-tazobactam (ZOSYN)  IV  3.375 g Intravenous Q8H  . sodium chloride  10-40 mL Intracatheter Q12H     Assessment/ Plan:   1. Acute renal failure: Nonoliguric. Appears to be predominantly contrast-induced nephropathy versus hemodynamically mediated acute kidney injury. His urinalysis findings are consistent with contrast-induced nephropathy (high specific gravity). Renal ultrasound negative for any obstruction. Continue intravenous fluids for now fearing that with ileus he'll continue to have third spacing/reduction in renal flow. Continue to monitor closely. Current electrolytes and volume status not wanting of any acute intervention-dialysis. Anticipate renal recovery is underway.  2. Plasma cell dyscrasia/amyloidosis: Status post stem cell harvest, transplantation when more clinically stable.  3. Anemia: Suspect may be related to  his underlying plasma cell dyscrasia, management per Dr. Truett Perna.  4. Right IJ thrombus, pulmonary embolus, SVC thrombus and portal vein thrombus: Currently on Argatroban with concerns for heparin-induced thrombocytopenia  5. Abdominal distention/ileus: The patient is currently n.p.o. and without any acute surgical needs per surgery notes. TPN started.  With recovering UOP/renal function and no acute needs, will sign off. Please  call if I can assist further in his management.  Zetta Bills, MD 11/01/2012, 12:19 PM

## 2012-11-02 DIAGNOSIS — R1110 Vomiting, unspecified: Secondary | ICD-10-CM

## 2012-11-02 LAB — COMPREHENSIVE METABOLIC PANEL
ALT: 12 U/L (ref 0–53)
AST: 11 U/L (ref 0–37)
Albumin: 2.1 g/dL — ABNORMAL LOW (ref 3.5–5.2)
Alkaline Phosphatase: 86 U/L (ref 39–117)
BUN: 39 mg/dL — ABNORMAL HIGH (ref 6–23)
CO2: 24 mEq/L (ref 19–32)
Calcium: 8.1 mg/dL — ABNORMAL LOW (ref 8.4–10.5)
Chloride: 111 mEq/L (ref 96–112)
Creatinine, Ser: 1.47 mg/dL — ABNORMAL HIGH (ref 0.50–1.35)
GFR calc Af Amer: 55 mL/min — ABNORMAL LOW (ref >90)
GFR calc non Af Amer: 48 mL/min — ABNORMAL LOW (ref >90)
Glucose, Bld: 133 mg/dL — ABNORMAL HIGH (ref 70–99)
Potassium: 3.4 mEq/L — ABNORMAL LOW (ref 3.5–5.1)
Sodium: 143 mEq/L (ref 135–145)
Total Bilirubin: 0.4 mg/dL (ref 0.3–1.2)
Total Protein: 5 g/dL — ABNORMAL LOW (ref 6.0–8.3)

## 2012-11-02 LAB — CBC
HCT: 24.5 % — ABNORMAL LOW (ref 39.0–52.0)
Hemoglobin: 8.1 g/dL — ABNORMAL LOW (ref 13.0–17.0)
MCH: 28.6 pg (ref 26.0–34.0)
MCHC: 33.1 g/dL (ref 30.0–36.0)
MCV: 86.6 fL (ref 78.0–100.0)
Platelets: 198 10*3/uL (ref 150–400)
RBC: 2.83 MIL/uL — ABNORMAL LOW (ref 4.22–5.81)
RDW: 15.5 % (ref 11.5–15.5)
WBC: 9.5 10*3/uL (ref 4.0–10.5)

## 2012-11-02 LAB — GLUCOSE, CAPILLARY
Glucose-Capillary: 129 mg/dL — ABNORMAL HIGH (ref 70–99)
Glucose-Capillary: 133 mg/dL — ABNORMAL HIGH (ref 70–99)
Glucose-Capillary: 134 mg/dL — ABNORMAL HIGH (ref 70–99)
Glucose-Capillary: 140 mg/dL — ABNORMAL HIGH (ref 70–99)
Glucose-Capillary: 152 mg/dL — ABNORMAL HIGH (ref 70–99)

## 2012-11-02 LAB — MAGNESIUM: Magnesium: 2.5 mg/dL (ref 1.5–2.5)

## 2012-11-02 LAB — PHOSPHORUS: Phosphorus: 2.5 mg/dL (ref 2.3–4.6)

## 2012-11-02 LAB — APTT: aPTT: 81 seconds — ABNORMAL HIGH (ref 24–37)

## 2012-11-02 MED ORDER — POTASSIUM CHLORIDE IN NACL 40-0.9 MEQ/L-% IV SOLN
INTRAVENOUS | Status: DC
  Administered 2012-11-02 – 2012-11-03 (×2): via INTRAVENOUS
  Filled 2012-11-02 (×3): qty 1000

## 2012-11-02 MED ORDER — POTASSIUM CHLORIDE 10 MEQ/100ML IV SOLN
10.0000 meq | INTRAVENOUS | Status: DC
  Filled 2012-11-02 (×4): qty 100

## 2012-11-02 MED ORDER — BISACODYL 10 MG RE SUPP
10.0000 mg | Freq: Two times a day (BID) | RECTAL | Status: AC
  Administered 2012-11-02 – 2012-11-03 (×3): 10 mg via RECTAL
  Filled 2012-11-02 (×3): qty 1

## 2012-11-02 MED ORDER — POTASSIUM CHLORIDE 10 MEQ/100ML IV SOLN
10.0000 meq | Freq: Once | INTRAVENOUS | Status: AC
  Administered 2012-11-02: 10 meq via INTRAVENOUS
  Filled 2012-11-02: qty 100

## 2012-11-02 MED ORDER — METOCLOPRAMIDE HCL 5 MG/ML IJ SOLN
10.0000 mg | Freq: Three times a day (TID) | INTRAMUSCULAR | Status: AC
  Administered 2012-11-02 – 2012-11-05 (×9): 10 mg via INTRAVENOUS
  Filled 2012-11-02 (×9): qty 2

## 2012-11-02 MED ORDER — POTASSIUM CHLORIDE 10 MEQ/100ML IV SOLN
10.0000 meq | INTRAVENOUS | Status: AC
  Administered 2012-11-02 (×3): 10 meq via INTRAVENOUS
  Filled 2012-11-02 (×3): qty 100

## 2012-11-02 MED ORDER — CLINIMIX E/DEXTROSE (5/20) 5 % IV SOLN
INTRAVENOUS | Status: AC
  Administered 2012-11-02: 18:00:00 via INTRAVENOUS
  Filled 2012-11-02: qty 2000

## 2012-11-02 NOTE — Progress Notes (Signed)
PARENTERAL NUTRITION CONSULT NOTE - FOLLOW UP  Pharmacy Consult for TNA Indication: Ileus  No Known Allergies  Patient Measurements: Height: 6' 0.83" (185 cm) (as recorded earlier 12/6) Weight: 188 lb 7.9 oz (85.5 kg) IBW/kg (Calculated) : 79.52  Adjusted Body Weight: 80 kg Usual Weight: 188-194 lb  Vital Signs: Temp: 98.5 F (36.9 C) (12/12 0546) Temp src: Oral (12/12 0546) BP: 132/73 mmHg (12/12 0546) Pulse Rate: 85  (12/12 0546) Intake/Output from previous day: 12/11 0701 - 12/12 0700 In: 1765.5 [I.V.:169.2; TPN:1596.3] Out: 1200 [Urine:1200] Intake/Output from this shift:   Labs:  [facility] 11/02/12 0500 11/01/12 1545 11/01/12 0500 10/31/12 0540  WBC 9.5 -- 9.8 14.6*  HGB 8.1* -- 8.4* 9.7*  HCT 24.5* -- 25.8* 29.2*  PLT 198 -- 168 148*  APTT 81* 76* 53* --  INR -- -- -- --    Basename 11/02/12 0500 11/01/12 0500 10/31/12 0540 10/30/12 1627  NA 143 139 136 --  K 3.4* 3.5 4.1 --  CL 111 106 104 --  CO2 24 24 22  --  GLUCOSE 133* 135* 125* --  BUN 39* 44* 46* --  CREATININE 1.47* 1.64* 1.75* --  LABCREA -- -- -- 179.2  CREAT24HRUR -- -- -- --  CALCIUM 8.1* 8.3* 7.9* --  MG 2.5 2.4 -- --  PHOS 2.5 2.5 -- --  PROT 5.0* 5.0* 5.2* --  ALBUMIN 2.1* 2.0* 2.2* --  AST 11 14 13  --  ALT 12 13 16  --  ALKPHOS 86 67 77 --  BILITOT 0.4 0.4 0.4 --  BILIDIR -- -- -- --  IBILI -- -- -- --  PREALBUMIN -- 6.0* -- --  TRIG -- 74 -- --  CHOLHDL -- -- -- --  CHOL -- 111 -- --   Estimated Creatinine Clearance: 54.8 ml/min (by C-G formula based on Cr of 1.47).    Basename 11/02/12 0601 11/01/12 2335 11/01/12 2115  GLUCAP 152* 144* 133*    Medications:  Scheduled:     . insulin aspart  0-9 Units Subcutaneous Q6H  . pantoprazole (PROTONIX) IV  40 mg Intravenous q morning - 10a  . piperacillin-tazobactam (ZOSYN)  IV  3.375 g Intravenous Q8H  . sodium chloride  10-40 mL Intracatheter Q12H    Insulin Requirements in the past 24 hours:  4 units sensitive SSI/24  hours CBGs 124-152  Current Nutrition: NPO Clinimix E 5/20 at 50 ml/hr  IVF: NS @ 60 ml/hr  Assessment: 66 yo male with plasma cell dyscrasia/amyloidosis scheduled for high dose chemo and stem cell support next week with acute N/V and abdominal pain with CT confirming SMV/portal vein thrombosis with changes of bowel ischemia causing current ileus.  Lytes: K+ slightly low, corrected ca 9.6 WNL, other lytes WNL Renal: scr improving, Cl ~ 55 ml/min LFTs: WNL TG: 74 (12/10) PreAlb: 6 (12/10)  Nutritional Goals:  Per RD: 1610-9604 kCal, 105-130 grams of protein per day Goal rate Clinimix E 5/20 at 100 ml/hr will deliver per day: 120 gm protein Avg 2300 Kcal/day *Lipids, MVI, Trace Elements only on MWF due to national back order.  Plan:   Increase Clinimix E 5/20 to 41ml/hr  Replace K+ cautiously in patient with acute renal failure, but now improving - give 10 mEq K+ IV every 1 hour x 3  Lipids MWF d/t national back order  MVI, Trace Elements only on MWF due to national back order.  Reduce mIVF to 40 ml/lhr  Full TNA labs Mon/Thurs  Continue CBG q6hr and insulin per  sensitive scale.  Tammy Sours, Pharm.D. Clinical Oncology Pharmacist  Pager # [phone]  11/02/2012,7:27 AM

## 2012-11-02 NOTE — Progress Notes (Signed)
Patient ID: DELAINE CANTER, male   DOB: 05-Feb-1946, 66 y.o.   MRN: [id number]    Subjective: Feels better, less pain but still with distention, denies flatus or BM, denies n/v  Objective: Vital signs in last 24 hours: Temp:  [97.7 F (36.5 C)-98.5 F (36.9 C)] 98.5 F (36.9 C) (12/12 0546) Pulse Rate:  [85-96] 85  (12/12 0546) Resp:  [18-22] 20  (12/12 0546) BP: (118-132)/(64-73) 132/73 mmHg (12/12 0546) SpO2:  [95 %-98 %] 98 % (12/12 0546) Last BM Date: 10/28/12  Intake/Output from previous day: 12/11 0701 - 12/12 0700 In: 1765.5 [I.V.:169.2; TPN:1596.3] Out: 1200 [Urine:1200] Intake/Output this shift:    PE: Abd: mildly distended, +bs, min tenderness   Lab Results:   Basename 11/02/12 0500 11/01/12 0500  WBC 9.5 9.8  HGB 8.1* 8.4*  HCT 24.5* 25.8*  PLT 198 168   BMET  Basename 11/02/12 0500 11/01/12 0500  NA 143 139  K 3.4* 3.5  CL 111 106  CO2 24 24  GLUCOSE 133* 135*  BUN 39* 44*  CREATININE 1.47* 1.64*  CALCIUM 8.1* 8.3*   PT/INR No results found for this basename: LABPROT:2,INR:2 in the last 72 hours CMP     Component Value Date/Time   NA 143 11/02/2012 0500   K 3.4* 11/02/2012 0500   CL 111 11/02/2012 0500   CO2 24 11/02/2012 0500   GLUCOSE 133* 11/02/2012 0500   BUN 39* 11/02/2012 0500   CREATININE 1.47* 11/02/2012 0500   CALCIUM 8.1* 11/02/2012 0500   PROT 5.0* 11/02/2012 0500   ALBUMIN 2.1* 11/02/2012 0500   AST 11 11/02/2012 0500   ALT 12 11/02/2012 0500   ALKPHOS 86 11/02/2012 0500   BILITOT 0.4 11/02/2012 0500   GFRNONAA 48* 11/02/2012 0500   GFRAA 55* 11/02/2012 0500   Lipase     Component Value Date/Time   LIPASE 10* 10/28/2012 2253       Studies/Results: No results found.  Anti-infectives: Anti-infectives     Start     Dose/Rate Route Frequency Ordered Stop   10/30/12 1015  piperacillin-tazobactam (ZOSYN) IVPB 3.375 g       3.375 g 12.5 mL/hr over 240 Minutes Intravenous Every 8 hours 10/30/12 1005              Assessment/Plan 1. Acute mesenteric venous thrombosis SMV and PV: Ileus and mesenteric edema and small bowel edema secondary to this.No evidence of bowel infarction this time.  --bowel rest, anticoagulation, and IV antibiotics  --cont TNA.   --once patient starts to have bowel function can try on clears but would go very slow 2. Questionable HIT: labs pending, on argatroban now  Defer assessment of potential underlying hypercoagulable state to Dr. Truett Perna.  3. Amyloidosis, possible multiple myeloma  4. Recent pulmonary embolism  5. Recent DVT of right IJ at pheresis catheter site   LOS: 6 days    Zaharah Amir 11/02/2012

## 2012-11-02 NOTE — Progress Notes (Signed)
ANTIBIOTIC CONSULT NOTE - Follow-up  Pharmacy Consult for Zosyn  Indication: Empiric for GI coverage  No Known Allergies  Patient Measurements: Height: 6' 0.83" (185 cm) (as recorded earlier 12/6) Weight: 188 lb 7.9 oz (85.5 kg) IBW/kg (Calculated) : 79.52   Vital Signs: Temp: 98.5 F (36.9 C) (12/12 0546) Temp src: Oral (12/12 0546) BP: 132/73 mmHg (12/12 0546) Pulse Rate: 85  (12/12 0546) Intake/Output from previous day: 12/11 0701 - 12/12 0700 In: 1765.5 [I.V.:169.2; TPN:1596.3] Out: 1200 [Urine:1200] Intake/Output from this shift:    Labs:  Basename 11/02/12 0500 11/01/12 0500 10/31/12 0540 10/30/12 1627  WBC 9.5 9.8 14.6* --  HGB 8.1* 8.4* 9.7* --  PLT 198 168 148* --  LABCREA -- -- -- 179.2  CREATININE 1.47* 1.64* 1.75* --   Estimated Creatinine Clearance: 54.8 ml/min (by C-G formula based on Cr of 1.47). No results found for this basename: VANCOTROUGH:2,VANCOPEAK:2,VANCORANDOM:2,GENTTROUGH:2,GENTPEAK:2,GENTRANDOM:2,TOBRATROUGH:2,TOBRAPEAK:2,TOBRARND:2,AMIKACINPEAK:2,AMIKACINTROU:2,AMIKACIN:2, in the last 72 hours   Microbiology: No results found for this or any previous visit (from the past 720 hour(s)).  Medical History: Past Medical History  Diagnosis Date  . Dyslipidemia   . H/O multiple myeloma   . Spastic quadriparesis   . DVT (deep venous thrombosis)     right  . Pneumonia     hx of with last time in Nov 2012  . Joint pain   . Sleep apnea     CPAP- q night, [place] March- 2 yrs.   . Prostate cancer     amyloidosis, multiple myeloma   . Arthritis     lumbar DDD  . Peripheral vascular disease     DVT- 01/2012, follows by Dr. Truett Perna, lovenox maintained since Spring 2013 , pt. aware that last dose is sch. for 07/22/2012  . Blood dyscrasia     plasma cell dyscrasia with associated amyloidosis    Medications:  Scheduled:     . insulin aspart  0-9 Units Subcutaneous Q6H  . pantoprazole (PROTONIX) IV  40 mg Intravenous q morning - 10a  .  piperacillin-tazobactam (ZOSYN)  IV  3.375 g Intravenous Q8H  . potassium chloride  10 mEq Intravenous Q1 Hr x 3  . sodium chloride  10-40 mL Intracatheter Q12H   Anti-infectives     Start     Dose/Rate Route Frequency Ordered Stop   10/30/12 1015   piperacillin-tazobactam (ZOSYN) IVPB 3.375 g        3.375 g 12.5 mL/hr over 240 Minutes Intravenous Every 8 hours 10/30/12 1005           Assessment: Brent Allison with possible ischemic colitis to on day 4 of Zosyn per pharmacy for GI coverage/leukocytosis.   Acute renal failure secondary to contrast now improving  Afebrile, WBCs improved to WNL   Plan:   Continue Zosyn 3.375 g IV every 8 hours (4 hour infusion)  Follow renal function and adjust zosyn accordingly  Defer antibiotic duration to MD  Brent Allison, Pharm.D. Clinical Oncology Pharmacist  Pager # [phone]  11/02/2012,8:23 AM

## 2012-11-02 NOTE — Progress Notes (Signed)
ANTICOAGULATION CONSULT NOTE - Follow Up Consult  Pharmacy Consult for argatroban Indication: pulmonary embolus  No Known Allergies  Patient Measurements: Height: 6' 0.83" (185 cm) (as recorded earlier 12/6) Weight: 188 lb 7.9 oz (85.5 kg) IBW/kg (Calculated) : 79.52  Heparin Dosing Weight:   Vital Signs: Temp: 98.5 F (36.9 C) (12/12 0546) Temp src: Oral (12/12 0546) BP: 132/73 mmHg (12/12 0546) Pulse Rate: 85  (12/12 0546)  Labs:  Basename 11/02/12 0500 11/01/12 1545 11/01/12 0500 10/31/12 0540  HGB 8.1* -- 8.4* --  HCT 24.5* -- 25.8* 29.2*  PLT 198 -- 168 148*  APTT 81* 76* 53* --  LABPROT -- -- -- --  INR -- -- -- --  HEPARINUNFRC -- -- -- --  CREATININE 1.47* -- 1.64* 1.75*  CKTOTAL -- -- -- --  CKMB -- -- -- --  TROPONINI -- -- -- --    Estimated Creatinine Clearance: 54.8 ml/min (by C-G formula based on Cr of 1.47).   Medications:  Infusions:    . sodium chloride 20 mL/hr (10/31/12 1315)  . sodium chloride 60 mL/hr at 10/31/12 1125  . argatroban 1 mcg/kg/min (11/01/12 1458)  . TPN (CLINIMIX) +/- additives 50 mL/hr at 11/01/12 1731   And  . fat emulsion 250 mL (11/01/12 1731)  . [EXPIRED] TPN (CLINIMIX) +/- additives 40 mL/hr at 10/31/12 1740    Assessment: Patient with PTT at goal this am.  No issues per RN.  Goal of Therapy:  aPTT 50-90 seconds  Monitor platelets by anticoagulation protocol: Yes   Plan:  Continue argatroban at current rate.   Daily CBC and aPTT. Follow up HIT panel (currently in process)   Brent Allison, Brent Allison 11/02/2012,6:55 AM

## 2012-11-02 NOTE — Progress Notes (Signed)
IP PROGRESS NOTE  Subjective:   He had an episode of emesis yesterday. No flatus.  Objective: Vital signs in last 24 hours: Blood pressure 132/73, pulse 85, temperature 98.5 F (36.9 C), temperature source Oral, resp. rate 20, height 6' 0.84" (1.85 m), weight 188 lb 7.9 oz (85.5 kg), SpO2 98.00%.  Intake/Output from previous day: 12/11 0701 - 12/12 0700 In: 1765.5 [I.V.:169.2; TPN:1596.3] Out: 1200 [Urine:1200]  Physical Exam:  Lungs: Clear bilaterally Cardiac: Regular rate and rhythm Abdomen: Distended, hypoactive bowel Extremities: Trace pitting edema at the low leg bilaterally Left arm PICC without erythema  Lab Results:  Basename 11/02/12 0500 11/01/12 0500  WBC 9.5 9.8  HGB 8.1* 8.4*  HCT 24.5* 25.8*  PLT 198 168    BMET  Basename 11/02/12 0500 11/01/12 0500  NA 143 139  K 3.4* 3.5  CL 111 106  CO2 24 24  GLUCOSE 133* 135*  BUN 39* 44*  CREATININE 1.47* 1.64*  CALCIUM 8.1* 8.3*   PTT 81   Studies/Results: No results found.  Medications: I have reviewed the patient's current medications.  Assessment/Plan:  1. Acute pulmonary embolism-presumably from a right jugular DVT, now on Argatroban 2. Right neck DVT secondary to a pheresis catheter-hospitalized at [facility] from 10/23/2012 through 10/25/2012 and treated with heparin/Lovenox  3. Thrombocytopenia-improved, likely related to the stem cell harvest procedure versus HIT 4. Right lower extremity deep vein thrombosis 02/02/2012, Doppler examination of the legs 10/28/2012 revealed no acute DVT. 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 5. Plasma cell dyscrasia/amyloidosis-scheduled for high-dose chemotherapy and stem cell support next week. 6. Acute nausea/vomiting and abdominal pain on 10/28/2012. A CT 10/29/2012 confirmed and SMV/portal vein thrombosis with changes of bowel ischemia. Continue anticoagulation with Argatroban. The ileus persist. 7.? Diastolic heart murmur 8. acute renal  failure-the urine output improved late yesterday. The creatinine is improved and he is urinating. Likely contrast and dehydration related nephropathy  9. Nutrition-TNA started 10/31/2012  The ileus persist. He continues to be followed by the surgical service. We are waiting on the HIT panel to decide on further anticoagulation therapy. I went to the Lab yesterday and they reported the specimen was received at the reference lab on 11/01/2012.     LOS: 6 days   Fran Neiswonger, Jillyn Hidden  11/02/2012, 9:42 AM

## 2012-11-02 NOTE — Progress Notes (Signed)
General surgery attending note:  I have personally interviewed and examined this patient today. I agree with the assessment and treatment plan outlined by Clance Boll, P.A.  The patient still has an ileus and his abdomen is a little bit tender. I think this is just due to distention. He also has mild hypokalemia.  Plan: I will add Dulcolax  suppositories and IV Reglan to his regimen. I will supplement potassium in his IVF's.. Suggest GI consultation to see  if they have anything to add for his ileus. Await HIT panel.     Angelia Mould. Derrell Lolling, M.D., [facility] General and Minimally invasive Surgery Breast and Colorectal Surgery Office:   [phone] Pager:   [phone]

## 2012-11-03 ENCOUNTER — Inpatient Hospital Stay (HOSPITAL_COMMUNITY): Payer: Medicare Other

## 2012-11-03 DIAGNOSIS — K921 Melena: Secondary | ICD-10-CM

## 2012-11-03 DIAGNOSIS — K560 Paralytic ileus: Secondary | ICD-10-CM

## 2012-11-03 LAB — GLUCOSE, CAPILLARY
Glucose-Capillary: 141 mg/dL — ABNORMAL HIGH (ref 70–99)
Glucose-Capillary: 118 mg/dL — ABNORMAL HIGH (ref 70–99)
Glucose-Capillary: 120 mg/dL — ABNORMAL HIGH (ref 70–99)

## 2012-11-03 LAB — CBC
HCT: 24.2 % — ABNORMAL LOW (ref 39.0–52.0)
Hemoglobin: 7.9 g/dL — ABNORMAL LOW (ref 13.0–17.0)
MCH: 28.5 pg (ref 26.0–34.0)
MCHC: 32.6 g/dL (ref 30.0–36.0)
MCV: 87.4 fL (ref 78.0–100.0)
Platelets: 211 10*3/uL (ref 150–400)
RBC: 2.77 MIL/uL — ABNORMAL LOW (ref 4.22–5.81)
RDW: 15.5 % (ref 11.5–15.5)
WBC: 6.7 10*3/uL (ref 4.0–10.5)

## 2012-11-03 LAB — BASIC METABOLIC PANEL
BUN: 36 mg/dL — ABNORMAL HIGH (ref 6–23)
CO2: 23 mEq/L (ref 19–32)
Calcium: 8.1 mg/dL — ABNORMAL LOW (ref 8.4–10.5)
Chloride: 108 mEq/L (ref 96–112)
Creatinine, Ser: 1.47 mg/dL — ABNORMAL HIGH (ref 0.50–1.35)
GFR calc Af Amer: 55 mL/min — ABNORMAL LOW (ref >90)
GFR calc non Af Amer: 48 mL/min — ABNORMAL LOW (ref >90)
Glucose, Bld: 134 mg/dL — ABNORMAL HIGH (ref 70–99)
Potassium: 3.7 mEq/L (ref 3.5–5.1)
Sodium: 138 mEq/L (ref 135–145)

## 2012-11-03 LAB — HEPARIN INDUCED THROMBOCYTOPENIA PNL
Patient O.D.: 2.057
UFH High Dose UFH H: 0
UFH Low Dose 0.1 IU/mL: 82
UFH Low Dose 0.5 IU/mL: 89
UFH SRA Result: POSITIVE

## 2012-11-03 LAB — APTT: aPTT: 87 seconds — ABNORMAL HIGH (ref 24–37)

## 2012-11-03 MED ORDER — POTASSIUM CHLORIDE 10 MEQ/100ML IV SOLN
10.0000 meq | INTRAVENOUS | Status: AC
  Administered 2012-11-03 (×3): 10 meq via INTRAVENOUS
  Filled 2012-11-03 (×3): qty 100

## 2012-11-03 MED ORDER — M.V.I. ADULT IV INJ
INJECTION | INTRAVENOUS | Status: AC
  Administered 2012-11-03: 18:00:00 via INTRAVENOUS
  Filled 2012-11-03: qty 2000

## 2012-11-03 MED ORDER — FAT EMULSION 20 % IV EMUL
250.0000 mL | INTRAVENOUS | Status: AC
  Administered 2012-11-03: 250 mL via INTRAVENOUS
  Filled 2012-11-03: qty 250

## 2012-11-03 NOTE — Progress Notes (Signed)
PARENTERAL NUTRITION CONSULT NOTE - FOLLOW UP  Pharmacy Consult for TNA Indication: Ileus  No Known Allergies  Patient Measurements: Height: 6' 0.83" (185 cm) (as recorded earlier 12/6) Weight: 188 lb 7.9 oz (85.5 kg) IBW/kg (Calculated) : 79.52  Adjusted Body Weight: 80 kg Usual Weight: 188-194 lb  Vital Signs: Temp: 99.4 F (37.4 C) (12/12 2318) Temp src: Oral (12/12 2318) BP: 119/63 mmHg (12/12 2318) Pulse Rate: 87  (12/12 2318) Intake/Output from previous day: 12/12 0701 - 12/13 0700 In: 1788 [I.V.:861; IV Piggyback:144; TPN:783] Out: 750 [Urine:750] Intake/Output from this shift:   Labs:  Basename 11/02/12 0500 11/01/12 1545 11/01/12 0500  WBC 9.5 -- 9.8  HGB 8.1* -- 8.4*  HCT 24.5* -- 25.8*  PLT 198 -- 168  APTT 81* 76* 53*  INR -- -- --    Basename 11/02/12 0500 11/01/12 0500  NA 143 139  K 3.4* 3.5  CL 111 106  CO2 24 24  GLUCOSE 133* 135*  BUN 39* 44*  CREATININE 1.47* 1.64*  LABCREA -- --  CREAT24HRUR -- --  CALCIUM 8.1* 8.3*  MG 2.5 2.4  PHOS 2.5 2.5  PROT 5.0* 5.0*  ALBUMIN 2.1* 2.0*  AST 11 14  ALT 12 13  ALKPHOS 86 67  BILITOT 0.4 0.4  BILIDIR -- --  IBILI -- --  PREALBUMIN -- 6.0*  TRIG -- 74  CHOLHDL -- --  CHOL -- 111   Estimated Creatinine Clearance: 54.8 ml/min (by C-G formula based on Cr of 1.47).    Basename 11/02/12 2356 11/02/12 1815 11/02/12 1203  GLUCAP 134* 140* 129*    Medications:  Scheduled:     . bisacodyl  10 mg Rectal BID  . insulin aspart  0-9 Units Subcutaneous Q6H  . metoCLOPramide (REGLAN) injection  10 mg Intravenous Q8H  . pantoprazole (PROTONIX) IV  40 mg Intravenous q morning - 10a  . piperacillin-tazobactam (ZOSYN)  IV  3.375 g Intravenous Q8H  . sodium chloride  10-40 mL Intracatheter Q12H    Insulin Requirements in the past 24 hours:  4 units sensitive SSI/24 hours CBGs 129-152  Current Nutrition: NPO Clinimix E 5/20 at 70 ml/hr  IVF: NS with 40 meq of K+ @ 40 ml/hr  Assessment: 66  yo male with plasma cell dyscrasia/amyloidosis scheduled for high dose chemo and stem cell support next week with acute N/V and abdominal pain with CT confirming SMV/portal vein thrombosis with changes of bowel ischemia causing current ileus.  Lytes: K+ now WNL after replacement, corrected ca 9.6 WNL, other lytes WNL Renal: scr improving, Cl ~ 55 ml/min LFTs: WNL TG: 74 (12/10) PreAlb: 6 (12/10)  Nutritional Goals:  Per RD: 4034-7425 kCal, 105-130 grams of protein per day Goal rate Clinimix E 5/20 at 100 ml/hr will deliver per day: 120 gm protein Avg 2300 Kcal/day *Lipids, MVI, Trace Elements only on MWF due to national back order.  Plan:   Increase Clinimix E 5/20 to 108ml/hr  Watch K+  Lipids MWF d/t national back order  MVI, Trace Elements only on MWF due to national back order.  Will leave mIVF @ 40 ml/hr since also contains potassium replacement - will evaluate K+ in the AM and KVO fluids if appropriate  Full TNA labs Mon/Thurs  Continue CBG q6hr and insulin per sensitive scale.  Tammy Sours, Pharm.D. Clinical Oncology Pharmacist  Pager # [phone]  11/03/2012,7:14 AM

## 2012-11-03 NOTE — Progress Notes (Signed)
ANTICOAGULATION CONSULT NOTE - Follow Up Consult  Pharmacy Consult for argatroban Indication: pulmonary embolus  No Known Allergies  Patient Measurements: Height: 6' 0.83" (185 cm) (as recorded earlier 12/6) Weight: 188 lb 7.9 oz (85.5 kg) IBW/kg (Calculated) : 79.52  Heparin Dosing Weight:   Vital Signs: Temp: 99.4 F (37.4 C) (12/12 2318) Temp src: Oral (12/12 2318) BP: 119/63 mmHg (12/12 2318) Pulse Rate: 87  (12/12 2318)  Labs:  Basename 11/03/12 0645 11/02/12 0500 11/01/12 1545 11/01/12 0500  HGB 7.9* 8.1* -- --  HCT 24.2* 24.5* -- 25.8*  PLT 211 198 -- 168  APTT 87* 81* 76* --  LABPROT -- -- -- --  INR -- -- -- --  HEPARINUNFRC -- -- -- --  CREATININE 1.47* 1.47* -- 1.64*  CKTOTAL -- -- -- --  CKMB -- -- -- --  TROPONINI -- -- -- --    Estimated Creatinine Clearance: 54.8 ml/min (by C-G formula based on Cr of 1.47).   Medications:  Infusions:     . sodium chloride 20 mL/hr (10/31/12 1315)  . 0.9 % NaCl with KCl 40 mEq / L 40 mL/hr at 11/02/12 1306  . argatroban 1 mcg/kg/min (11/03/12 0240)  . TPN (CLINIMIX) +/- additives 70 mL/hr at 11/02/12 1739    Assessment: Patient with PTT at goal this am.  No issues per RN.  HIT Panel pending  Goal of Therapy:  aPTT 50-90 seconds  Monitor platelets by anticoagulation protocol: Yes   Plan:  Continue argatroban at current rate.   Daily aPTT. Follow up HIT panel (currently in process) MD  Platelets have recovered, will defer to MD if would like to decrease frequency of CBC  Garrett Mitchum, Pharm.D. Clinical Oncology Pharmacist  Pager # [phone]  11/03/2012,7:58 AM

## 2012-11-03 NOTE — Progress Notes (Signed)
The pt has not had a BM since 10/28/12, dulcolax 10 mg suppository given this shift.  Pt was able to have a large BM however per the tech the BM was bright red w/ clots present.  Dr. Myna Hidalgo notified as pt is receiving aregatroban.  VS stable at this time and no new orders given.

## 2012-11-03 NOTE — Progress Notes (Signed)
General surgery attending note:  I have personally interviewed and examined this patient today. I agree with the assessment and treatment plan outlined by Zola Button, PA.  It is encouraging that he has had 2 bowel movements with flatus, nausea has resolved, the pain has essentially resolved, Leukocytosis has resolved, and he would like to drink more liquids. On the other hand, his abdomen is still distended and his x-rays are still consistent with either ileus or SBO.  Knowing that he has mesenteric venous thrombosis and her recent pulmonary embolism, I think the appropriate treatment is to stay on sips of liquids, anti-coagulation, and TNA for now. I do not think there is anything to be gained from surgical intervention on his abdomen at this time.  Continue IV Reglan, correct hypokalemia. Consider discontinue antibiotics soon.   Angelia Mould. Derrell Lolling, M.D., [facility] General and Minimally invasive Surgery Breast and Colorectal Surgery Office:   [phone] Pager:   [phone]

## 2012-11-03 NOTE — Progress Notes (Signed)
  Subjective: ON CPAP, abdomen still distended, but did have a BM and some flatus last PM.  None since and he reports he has not walked at least yesterday.  Objective: Vital signs in last 24 hours: Temp:  [97.9 F (36.6 C)-99.4 F (37.4 C)] 99.4 F (37.4 C) (12/12 2318) Pulse Rate:  [83-88] 87  (12/12 2318) Resp:  [18-20] 20  (12/12 2318) BP: (119-126)/(63-72) 119/63 mmHg (12/12 2318) SpO2:  [95 %-97 %] 96 % (12/12 2318) Last BM Date: 10/28/12  +BM/flatus last PM, Nursing reports the BM was bloody TM 99.4,VSS NPO/TNA BMP for today still pending, MG 2.5 yesterday PTT 87  Intake/Output from previous day: 12/12 0701 - 12/13 0700 In: 1788 [I.V.:861; IV Piggyback:144; TPN:783] Out: 750 [Urine:750] Intake/Output this shift:    General appearance: alert, cooperative and no distress GI: soft, but very distended, few hypoactive bowel sounds.  Lab Results:   Basename 11/03/12 0645 11/02/12 0500  WBC 6.7 9.5  HGB 7.9* 8.1*  HCT 24.2* 24.5*  PLT 211 198    BMET  Basename 11/02/12 0500 11/01/12 0500  NA 143 139  K 3.4* 3.5  CL 111 106  CO2 24 24  GLUCOSE 133* 135*  BUN 39* 44*  CREATININE 1.47* 1.64*  CALCIUM 8.1* 8.3*   PT/INR No results found for this basename: LABPROT:2,INR:2 in the last 72 hours   Lab 11/02/12 0500 11/01/12 0500 10/31/12 0540 10/30/12 0426 10/28/12 2253  AST 11 14 13 11  41*  ALT 12 13 16 21  44  ALKPHOS 86 67 77 88 131*  BILITOT 0.4 0.4 0.4 0.4 0.6  PROT 5.0* 5.0* 5.2* 5.1* 5.8*  ALBUMIN 2.1* 2.0* 2.2* 2.2* 2.5*     Lipase     Component Value Date/Time   LIPASE 10* 10/28/2012 2253     Studies/Results: No results found.  Medications:    . bisacodyl  10 mg Rectal BID  . insulin aspart  0-9 Units Subcutaneous Q6H  . metoCLOPramide (REGLAN) injection  10 mg Intravenous Q8H  . pantoprazole (PROTONIX) IV  40 mg Intravenous q morning - 10a  . piperacillin-tazobactam (ZOSYN)  IV  3.375 g Intravenous Q8H  . sodium chloride  10-40 mL  Intracatheter Q12H    Assessment/Plan 1. Acute mesenteric venous thrombosis SMV and PV: Ileus and mesenteric edema and small bowel edema secondary to this.No evidence of bowel infarction this time. Bloody BM last PM. 2. Questionable HIT: labs pending, on argatroban now  3. Amyloidosis, possible multiple myeloma  4. Recent pulmonary embolism (on Argatroban drip) 5. Recent DVT of right IJ at pheresis catheter site 6.Hypokalemia 7.Renal insuffiencey, creatinine improving.  Plan:  K+ being replace, labs not up for today , but he says they drew blood this AM.  Will check a film, and encourage ambulation. Continue anticoagulation.(Argatroban)  K+ up to 3.7, Will give 3 more runs of KCL today. Recheck labs in AM    LOS: 7 days    Brent Allison 11/03/2012

## 2012-11-03 NOTE — Progress Notes (Addendum)
IP PROGRESS NOTE  Subjective:   He feels better today. He is passing flatus and had a bowel movement. There was blood in the bowel movement per the nursing staff. He had an episode of emesis yesterday.  Objective: Vital signs in last 24 hours: Blood pressure 119/63, pulse 87, temperature 99.4 F (37.4 C), temperature source Oral, resp. rate 20, height 6' 0.84" (1.85 m), weight 188 lb 7.9 oz (85.5 kg), SpO2 96.00%.  Intake/Output from previous day: 12/12 0701 11/06/2023 0700 In: 1788 [I.V.:861; IV Piggyback:144; TPN:783] Out: 750 [Urine:750]  Physical Exam:  Lungs: Inspiratory rales at the left base, no respiratory distress Cardiac: Regular rate and rhythm Abdomen: Distended, bowel sounds are present Extremities: Trace pitting edema at the low leg bilaterally Left arm PICC without erythema  Lab Results:  Basename November 05, 2012 0645 11/02/12 0500  WBC 6.7 9.5  HGB 7.9* 8.1*  HCT 24.2* 24.5*  PLT 211 198    BMET  Basename 11-05-2012 0645 11/02/12 0500  NA 138 143  K 3.7 3.4*  CL 108 111  CO2 23 24  GLUCOSE 134* 133*  BUN 36* 39*  CREATININE 1.47* 1.47*  CALCIUM 8.1* 8.1*   PTT 87   Studies/Results: Dg Abd 2 Views  Nov 05, 2012  *RADIOLOGY REPORT*  Clinical Data: Nausea, vomiting, constipation.  ABDOMEN - 2 VIEW  Comparison: CT 10/29/2012  Findings: There are dilated small bowel loops throughout the abdomen with air-fluid levels.  Findings concerning for small bowel obstruction.  This has progressed since prior CT.  No free air.  Bibasilar airspace opacities are noted, likely atelectasis although pneumonia cannot be excluded.  Suspect small bilateral effusions.  No acute bony abnormality.  Postoperative changes in the lumbar spine.  IMPRESSION: New/worsening dilatation of small bowel loops throughout the abdomen with air-fluid levels.  Findings concerning for small bowel obstruction.   Original Report Authenticated By: Charlett Nose, M.D.     Medications: I have reviewed the  patient's current medications.  Assessment/Plan:  1. Acute pulmonary embolism-presumably from a right jugular DVT, now on Argatroban 2. Right neck DVT secondary to a pheresis catheter-hospitalized at [facility] from 10/23/2012 through 10/25/2012 and treated with heparin/Lovenox  3. Thrombocytopenia-resolved, likely related to the stem cell harvest procedure and HIT 4. Right lower extremity deep vein thrombosis 02/02/2012, Doppler examination of the legs 10/28/2012 revealed no acute DVT. 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 5. Plasma cell dyscrasia/amyloidosis-scheduled for high-dose chemotherapy and stem cell support at [facility] 6. Acute nausea/vomiting and abdominal pain on 10/28/2012. A CT 10/29/2012 confirmed an SMV/portal vein thrombosis with changes of bowel ischemia. Continue anticoagulation with Argatroban. The ileus persist.? Improvement today 7.? Diastolic heart murmur 8. acute renal failure-the urine output has improved and the creatinine has stabilized. Likely contrast/dehydration related nephropathy  9. Nutrition-TNA started 10/31/2012 10. Heparin induced thrombocytopenia/thromboses-I received a call from the [facility] laboratory with a report of a positive HIT screen. The serotonin release assay should be resulted later today. The plan is to continue Argatroban anticoagulation and convert to Coumadin when he is taking by mouth.  He has a persistent ileus. I discussed the case with Dr. Derrell Lolling. He will decide on discontinuing antibiotic therapy. He will remain n.p.o. except for sips of fluids. The plan is to continue anticoagulation therapy with Argatroban and convert to Coumadin with a "overlap "when he is taking by mouth.  I discussed the positive HIT screen with Mr. Cotten and his wife. He understands the plan for anticoagulation therapy and  the likelihood that he will be hospitalized for the next week.     LOS: 7 days   Gordie Crumby  11/03/2012, 2:10  PM

## 2012-11-04 ENCOUNTER — Encounter (HOSPITAL_COMMUNITY): Payer: Self-pay | Admitting: Oncology

## 2012-11-04 DIAGNOSIS — D7582 Heparin induced thrombocytopenia (HIT): Secondary | ICD-10-CM

## 2012-11-04 DIAGNOSIS — E8581 Light chain (AL) amyloidosis: Secondary | ICD-10-CM | POA: Diagnosis present

## 2012-11-04 DIAGNOSIS — Z86718 Personal history of other venous thrombosis and embolism: Secondary | ICD-10-CM

## 2012-11-04 DIAGNOSIS — D75829 Heparin-induced thrombocytopenia, unspecified: Secondary | ICD-10-CM | POA: Diagnosis present

## 2012-11-04 DIAGNOSIS — E859 Amyloidosis, unspecified: Secondary | ICD-10-CM

## 2012-11-04 HISTORY — DX: Heparin induced thrombocytopenia (HIT): D75.82

## 2012-11-04 HISTORY — DX: Heparin-induced thrombocytopenia, unspecified: D75.829

## 2012-11-04 HISTORY — DX: Light chain (AL) amyloidosis: E85.81

## 2012-11-04 LAB — CBC
HCT: 24.2 % — ABNORMAL LOW (ref 39.0–52.0)
Hemoglobin: 7.8 g/dL — ABNORMAL LOW (ref 13.0–17.0)
MCH: 28.3 pg (ref 26.0–34.0)
MCHC: 32.2 g/dL (ref 30.0–36.0)
MCV: 87.7 fL (ref 78.0–100.0)
Platelets: 225 10*3/uL (ref 150–400)
RBC: 2.76 MIL/uL — ABNORMAL LOW (ref 4.22–5.81)
RDW: 15.7 % — ABNORMAL HIGH (ref 11.5–15.5)
WBC: 6.5 10*3/uL (ref 4.0–10.5)

## 2012-11-04 LAB — GLUCOSE, CAPILLARY
Glucose-Capillary: 101 mg/dL — ABNORMAL HIGH (ref 70–99)
Glucose-Capillary: 123 mg/dL — ABNORMAL HIGH (ref 70–99)
Glucose-Capillary: 145 mg/dL — ABNORMAL HIGH (ref 70–99)
Glucose-Capillary: 147 mg/dL — ABNORMAL HIGH (ref 70–99)
Glucose-Capillary: 161 mg/dL — ABNORMAL HIGH (ref 70–99)

## 2012-11-04 LAB — SAMPLE TO BLOOD BANK

## 2012-11-04 LAB — BASIC METABOLIC PANEL
BUN: 34 mg/dL — ABNORMAL HIGH (ref 6–23)
CO2: 23 mEq/L (ref 19–32)
Calcium: 8.1 mg/dL — ABNORMAL LOW (ref 8.4–10.5)
Chloride: 108 mEq/L (ref 96–112)
Creatinine, Ser: 1.48 mg/dL — ABNORMAL HIGH (ref 0.50–1.35)
GFR calc Af Amer: 55 mL/min — ABNORMAL LOW (ref >90)
GFR calc non Af Amer: 47 mL/min — ABNORMAL LOW (ref >90)
Glucose, Bld: 133 mg/dL — ABNORMAL HIGH (ref 70–99)
Potassium: 4.1 mEq/L (ref 3.5–5.1)
Sodium: 139 mEq/L (ref 135–145)

## 2012-11-04 LAB — APTT: aPTT: 71 seconds — ABNORMAL HIGH (ref 24–37)

## 2012-11-04 MED ORDER — CLINIMIX E/DEXTROSE (5/20) 5 % IV SOLN
INTRAVENOUS | Status: AC
  Administered 2012-11-04: 18:00:00 via INTRAVENOUS
  Filled 2012-11-04: qty 2000

## 2012-11-04 MED ORDER — POTASSIUM CHLORIDE IN NACL 40-0.9 MEQ/L-% IV SOLN
INTRAVENOUS | Status: DC
  Administered 2012-11-05 – 2012-11-07 (×2): 20 mL/h via INTRAVENOUS
  Filled 2012-11-04 (×8): qty 1000

## 2012-11-04 MED ORDER — VITAMINS A & D EX OINT
TOPICAL_OINTMENT | CUTANEOUS | Status: AC
  Filled 2012-11-04: qty 5

## 2012-11-04 NOTE — Progress Notes (Signed)
PARENTERAL NUTRITION CONSULT NOTE - FOLLOW UP  Pharmacy Consult for TNA Indication: Ileus  Allergies  Allergen Reactions  . Heparin Other (See Comments)    Heparin induced thrombocytopenia    Patient Measurements: Height: 6' 0.83" (185 cm) (as recorded earlier 12/6) Weight: 188 lb 7.9 oz (85.5 kg) IBW/kg (Calculated) : 79.52  Adjusted Body Weight: 80 kg Usual Weight: 188-194 lb  Vital Signs: Temp: 98.3 F (36.8 C) (12/14 0320) Temp src: Oral (12/14 0320) BP: 113/65 mmHg (12/14 0320) Pulse Rate: 76  (12/14 0320) Intake/Output from previous day: 12/13 0701 - 12/14 0700 In: 2171 [P.O.:360; I.V.:595; TPN:1216] Out: -  Intake/Output from this shift:   Labs:  [facility] 11/04/12 0545 11/03/12 0645 11/02/12 0500  WBC 6.5 6.7 9.5  HGB 7.8* 7.9* 8.1*  HCT 24.2* 24.2* 24.5*  PLT 225 211 198  APTT 71* 87* 81*  INR -- -- --    Basename 11/04/12 0545 11/03/12 0645 11/02/12 0500  NA 139 138 143  K 4.1 3.7 3.4*  CL 108 108 111  CO2 23 23 24   GLUCOSE 133* 134* 133*  BUN 34* 36* 39*  CREATININE 1.48* 1.47* 1.47*  LABCREA -- -- --  CREAT24HRUR -- -- --  CALCIUM 8.1* 8.1* 8.1*  MG -- -- 2.5  PHOS -- -- 2.5  PROT -- -- 5.0*  ALBUMIN -- -- 2.1*  AST -- -- 11  ALT -- -- 12  ALKPHOS -- -- 86  BILITOT -- -- 0.4  BILIDIR -- -- --  IBILI -- -- --  PREALBUMIN -- -- --  TRIG -- -- --  CHOLHDL -- -- --  CHOL -- -- --   Estimated Creatinine Clearance: 54.5 ml/min (by C-G formula based on Cr of 1.48).    Basename 11/04/12 0451 11/04/12 0031 11/03/12 1728  GLUCAP 161* 147* 120*    Medications:  Scheduled:     . bisacodyl  10 mg Rectal BID  . insulin aspart  0-9 Units Subcutaneous Q6H  . metoCLOPramide (REGLAN) injection  10 mg Intravenous Q8H  . pantoprazole (PROTONIX) IV  40 mg Intravenous q morning - 10a  . piperacillin-tazobactam (ZOSYN)  IV  3.375 g Intravenous Q8H  . [COMPLETED] potassium chloride  10 mEq Intravenous Q1 Hr x 3  . sodium chloride  10-40 mL  Intracatheter Q12H    Insulin Requirements in the past 24 hours:  2 units sensitive SSI CBGs 118-161  Current Nutrition:  NPO Clinimix E 5/20 at 90 ml/hr  IVF: NS with 40 meq of K+ @ 40 ml/hr  Assessment: 66 yo male with plasma cell dyscrasia/amyloidosis scheduled for high dose chemo and stem cell support next week with acute N/V and abdominal pain with CT confirming SMV/portal vein thrombosis with changes of bowel ischemia causing current ileus.  Lytes: WNL Renal: scr trended down, Cl ~ 55 ml/min LFTs: WNL (12/12) TG: 74 (12/10) PreAlb: 6 (12/10)  Nutritional Goals:  Per RD: 2952-8413 kCal, 105-130 grams of protein per day Goal rate Clinimix E 5/20 at 100 ml/hr will deliver per day: 120 gm protein and Avg 2300 Kcal/day Lipids, MVI, Trace Elements only on MWF due to national back order.  Plan:   Continue Clinimix E 5/20 to 78ml/hr  Lipids MWF d/t national back order  MVI, Trace Elements only on MWF due to national back order.  KVO MIVF   Full TNA labs Mon/Thurs  Continue CBG q6hr and insulin per sensitive scale.  Gwen Her PharmD  [phone] 11/04/2012 7:54 AM

## 2012-11-04 NOTE — Progress Notes (Signed)
Patient ID: Brent Allison, male   DOB: 23-Nov-1945, 66 y.o.   MRN: [id number] [facility] Surgery Progress Note:   * No surgery found *  Subjective: Mental status is clear.  Reports passage of gas and some hunger Objective: Vital signs in last 24 hours: Temp:  [97.9 F (36.6 C)-98.3 F (36.8 C)] 97.9 F (36.6 C) (12/14 0550) Pulse Rate:  [76-91] 90  (12/14 0550) Resp:  [18-20] 20  (12/14 0550) BP: (113-121)/(59-67) 120/66 mmHg (12/14 0550) SpO2:  [97 %-100 %] 100 % (12/14 0550) Physical Exam: Work of breathing is not labored.  Distended but nontender Lab Results:  Results for orders placed during the hospital encounter of 10/27/12 (from the past 48 hour(s))  GLUCOSE, CAPILLARY     Status: Abnormal   Collection Time   11/02/12 12:03 PM      Component Value Range Comment   Glucose-Capillary 129 (*) 70 - 99 mg/dL    Comment 1 Documented in Chart      Comment 2 Notify RN     GLUCOSE, CAPILLARY     Status: Abnormal   Collection Time   11/02/12  6:15 PM      Component Value Range Comment   Glucose-Capillary 140 (*) 70 - 99 mg/dL    Comment 1 Notify RN     GLUCOSE, CAPILLARY     Status: Abnormal   Collection Time   11/02/12 11:56 PM      Component Value Range Comment   Glucose-Capillary 134 (*) 70 - 99 mg/dL    Comment 1 Notify RN     GLUCOSE, CAPILLARY     Status: Abnormal   Collection Time   11/03/12  6:34 AM      Component Value Range Comment   Glucose-Capillary 141 (*) 70 - 99 mg/dL   APTT     Status: Abnormal   Collection Time   11/03/12  6:45 AM      Component Value Range Comment   aPTT 87 (*) 24 - 37 seconds   CBC     Status: Abnormal   Collection Time   11/03/12  6:45 AM      Component Value Range Comment   WBC 6.7  4.0 - 10.5 K/uL    RBC 2.77 (*) 4.22 - 5.81 MIL/uL    Hemoglobin 7.9 (*) 13.0 - 17.0 g/dL    HCT 40.9 (*) 81.1 - 52.0 %    MCV 87.4  78.0 - 100.0 fL    MCH 28.5  26.0 - 34.0 pg    MCHC 32.6  30.0 - 36.0 g/dL    RDW 91.4  78.2 - 95.6 %    Platelets 211  150 - 400 K/uL   BASIC METABOLIC PANEL     Status: Abnormal   Collection Time   11/03/12  6:45 AM      Component Value Range Comment   Sodium 138  135 - 145 mEq/L    Potassium 3.7  3.5 - 5.1 mEq/L    Chloride 108  96 - 112 mEq/L    CO2 23  19 - 32 mEq/L    Glucose, Bld 134 (*) 70 - 99 mg/dL    BUN 36 (*) 6 - 23 mg/dL    Creatinine, Ser 2.13 (*) 0.50 - 1.35 mg/dL    Calcium 8.1 (*) 8.4 - 10.5 mg/dL    GFR calc non Af Amer 48 (*) >90 mL/min    GFR calc Af Amer 55 (*) >90 mL/min  GLUCOSE, CAPILLARY     Status: Abnormal   Collection Time   11/03/12 11:58 AM      Component Value Range Comment   Glucose-Capillary 118 (*) 70 - 99 mg/dL    Comment 1 Documented in Chart      Comment 2 Notify RN     GLUCOSE, CAPILLARY     Status: Abnormal   Collection Time   11/03/12  5:28 PM      Component Value Range Comment   Glucose-Capillary 120 (*) 70 - 99 mg/dL    Comment 1 Documented in Chart      Comment 2 Notify RN     GLUCOSE, CAPILLARY     Status: Abnormal   Collection Time   11/04/12 12:31 AM      Component Value Range Comment   Glucose-Capillary 147 (*) 70 - 99 mg/dL    Comment 1 Notify RN     GLUCOSE, CAPILLARY     Status: Abnormal   Collection Time   11/04/12  4:51 AM      Component Value Range Comment   Glucose-Capillary 161 (*) 70 - 99 mg/dL   APTT     Status: Abnormal   Collection Time   11/04/12  5:45 AM      Component Value Range Comment   aPTT 71 (*) 24 - 37 seconds   CBC     Status: Abnormal   Collection Time   11/04/12  5:45 AM      Component Value Range Comment   WBC 6.5  4.0 - 10.5 K/uL    RBC 2.76 (*) 4.22 - 5.81 MIL/uL    Hemoglobin 7.8 (*) 13.0 - 17.0 g/dL    HCT 16.1 (*) 09.6 - 52.0 %    MCV 87.7  78.0 - 100.0 fL    MCH 28.3  26.0 - 34.0 pg    MCHC 32.2  30.0 - 36.0 g/dL    RDW 04.5 (*) 40.9 - 15.5 %    Platelets 225  150 - 400 K/uL   BASIC METABOLIC PANEL     Status: Abnormal   Collection Time   11/04/12  5:45 AM      Component Value  Range Comment   Sodium 139  135 - 145 mEq/L    Potassium 4.1  3.5 - 5.1 mEq/L    Chloride 108  96 - 112 mEq/L    CO2 23  19 - 32 mEq/L    Glucose, Bld 133 (*) 70 - 99 mg/dL    BUN 34 (*) 6 - 23 mg/dL    Creatinine, Ser 8.11 (*) 0.50 - 1.35 mg/dL    Calcium 8.1 (*) 8.4 - 10.5 mg/dL    GFR calc non Af Amer 47 (*) >90 mL/min    GFR calc Af Amer 55 (*) >90 mL/min   SAMPLE TO BLOOD BANK     Status: Normal   Collection Time   11/04/12  5:45 AM      Component Value Range Comment   Blood Bank Specimen SAMPLE AVAILABLE FOR TESTING      Sample Expiration 11/07/2012      Radiology/Results: Dg Abd 2 Views  11/03/2012  *RADIOLOGY REPORT*  Clinical Data: Nausea, vomiting, constipation.  ABDOMEN - 2 VIEW  Comparison: CT 10/29/2012  Findings: There are dilated small bowel loops throughout the abdomen with air-fluid levels.  Findings concerning for small bowel obstruction.  This has progressed since prior CT.  No free air.  Bibasilar airspace opacities are noted,  likely atelectasis although pneumonia cannot be excluded.  Suspect small bilateral effusions.  No acute bony abnormality.  Postoperative changes in the lumbar spine.  IMPRESSION: New/worsening dilatation of small bowel loops throughout the abdomen with air-fluid levels.  Findings concerning for small bowel obstruction.   Original Report Authenticated By: Charlett Nose, M.D.    Assessment/Plan: Problem List: Patient Active Problem List  Diagnosis  . Sleep apnea  . Dyslipidemia  . Multiple myeloma  . Multiple myeloma  . Neuropathy  . DVT (deep venous thrombosis)  . Mesenteric venous thrombosis  . AL amyloidosis  . Heparin induced thrombocytopenia (HIT)   Resolving ileus.  May be able to start Coumadin tomorrow * No surgery found *   LOS: 8 days   Matt B. Daphine Deutscher, MD, [facility] [phone] beeper [phone]  11/04/2012 9:52 AM

## 2012-11-04 NOTE — Progress Notes (Signed)
Progress Note:  Subjective: Positive serotonin release assay confirms clinical suspicion of HIT He continues on argatroban 1 mcg/kg/mi Hb low but table at 7.8 Platelets now normal at 225,000 BP low but stable  I/O: 2171/? Urine output not recorded On TPN Abdomen remains distended, he is having bowel movements, uncomfortable No respiratory distress    Vitals: Filed Vitals:   11/04/12 0320  BP: 113/65  Pulse: 76  Temp: 98.3 F (36.8 C)  Resp: 20   Wt Readings from Last 3 Encounters:  10/27/12 188 lb 7.9 oz (85.5 kg)  10/27/12 188 lb 9.6 oz (85.548 kg)  10/26/12 188 lb 8 oz (85.503 kg)     PHYSICAL EXAM:  General alert, oritented Head:WNL Eyes:WNL Throat:no exudate Neck: Lymph Nodes: Lungs:clear Breasts:  Cardiac:reguglar, no murmur Abdominal: distended, non tender, decreased bowel sounds Extremities:no edema, no calf tenderness Vascular:  No cyanosis Neurologic: motor 5/5 Skin: no rash, no ecchymosis  Labs:   [facility] 11/04/12 0545 11/03/12 0645  WBC 6.5 6.7  HGB 7.8* 7.9*  HCT 24.2* 24.2*  PLT 225 211    Basename 11/03/12 0645 11/02/12 0500  NA 138 143  K 3.7 3.4*  CL 108 111  CO2 23 24  GLUCOSE 134* 133*  BUN 36* 39*  CREATININE 1.47* 1.47*  CALCIUM 8.1* 8.1*      Images Studies/Results:   Dg Abd 2 Views  11/03/2012  *RADIOLOGY REPORT*  Clinical Data: Nausea, vomiting, constipation.  ABDOMEN - 2 VIEW  Comparison: CT 10/29/2012  Findings: There are dilated small bowel loops throughout the abdomen with air-fluid levels.  Findings concerning for small bowel obstruction.  This has progressed since prior CT.  No free air.  Bibasilar airspace opacities are noted, likely atelectasis although pneumonia cannot be excluded.  Suspect small bilateral effusions.  No acute bony abnormality.  Postoperative changes in the lumbar spine.  IMPRESSION: New/worsening dilatation of small bowel loops throughout the abdomen with air-fluid levels.  Findings  concerning for small bowel obstruction.   Original Report Authenticated By: Charlett Nose, M.D.      Patient Active Problem List  Diagnosis  . Sleep apnea  . Dyslipidemia  . Multiple myeloma  . Multiple myeloma  . Neuropathy  . DVT (deep venous thrombosis)  . Mesenteric venous thrombosis    Assessment and Plan:  #1. Complex Coagulopathy: 1 year post RLE DVT related to Revlimid/decadron to Rx amyloidosis R internal jugular DVT from vascular cathter used to harvest stem cells 10/23/12 Pulmonary embolus/thrombocytoopenia then subsequent mesenteric vascular thrombosis due to heparin induced thrombocytopenia/thrombosis syndrome Stable on Argatroban/platelets have recovered. #2. Ileus due to mesenteric vascular insult on TPN. Slowly advancing PO intake #3. Relapsed Amyloidosis #4. Acute renal failure - improving Plan: Continue argatroban anticoagulation. We may be able to transition to SQ Arixtra while allowing time for his bowel to recover.  No coumadin until full bowel recovery.    Felton Buczynski M 11/04/2012, 7:22 AM

## 2012-11-04 NOTE — Progress Notes (Signed)
ANTICOAGULATION CONSULT NOTE - Follow Up Consult  Pharmacy Consult for argatroban Indication: pulmonary embolus  Allergies  Allergen Reactions  . Heparin Other (See Comments)    Heparin induced thrombocytopenia    Patient Measurements: Height: 6' 0.83" (185 cm) (as recorded earlier 12/6) Weight: 188 lb 7.9 oz (85.5 kg) IBW/kg (Calculated) : 79.52  Heparin Dosing Weight:   Vital Signs: Temp: 98.3 F (36.8 C) (12/14 0320) Temp src: Oral (12/14 0320) BP: 113/65 mmHg (12/14 0320) Pulse Rate: 76  (12/14 0320)  Labs:  Basename 11/04/12 0545 11/03/12 0645 11/02/12 0500  HGB 7.8* 7.9* --  HCT 24.2* 24.2* 24.5*  PLT 225 211 198  APTT 71* 87* 81*  LABPROT -- -- --  INR -- -- --  HEPARINUNFRC -- -- --  CREATININE -- 1.47* 1.47*  CKTOTAL -- -- --  CKMB -- -- --  TROPONINI -- -- --    Estimated Creatinine Clearance: 54.8 ml/min (by C-G formula based on Cr of 1.47).   Medications:  Infusions:     . sodium chloride 20 mL/hr (10/31/12 1315)  . 0.9 % NaCl with KCl 40 mEq / L 40 mL/hr at 11/03/12 1943  . argatroban 1 mcg/kg/min (11/03/12 2017)  . fat emulsion 250 mL (11/03/12 1748)  . [EXPIRED] TPN (CLINIMIX) +/- additives 70 mL/hr at 11/02/12 1739  . TPN (CLINIMIX) +/- additives 90 mL/hr at 11/03/12 1748    Assessment: 10 YOM recently hospitalized @ [facility] w/ R internal jugular clot 10/23/12 10/25/12 thought to be secondary to pheresis catheter. Pt was started on heparin infusion then transitioned to Lovenox 80mg  sq q12h. Pt [facility] to [facility] 12/6 with fever, cough and shortness of breath and admitted. Pt found to have RLL PE and was started on Heparin gtt. Pltc fell and concern for HIT so changed to argatroban infusion 10/29/12.  PTT this am within therapeutic range @ 5.1 ml/hr (61mcg/kg/min)  No bleeding reported  Pltc recovered  SRA confirms + HIT   Goal of Therapy:  aPTT 50-90 seconds Monitor platelets by anticoagulation protocol: Yes   Plan:   Continue argatroban at  current rate.    Daily aPTT.   Await MD decision to transition to oral anticoagulation  Gwen Her PharmD  [phone] 11/04/2012 7:31 AM

## 2012-11-05 LAB — CBC
HCT: 25 % — ABNORMAL LOW (ref 39.0–52.0)
Hemoglobin: 8.1 g/dL — ABNORMAL LOW (ref 13.0–17.0)
MCH: 28.2 pg (ref 26.0–34.0)
MCHC: 32.4 g/dL (ref 30.0–36.0)
MCV: 87.1 fL (ref 78.0–100.0)
Platelets: 248 10*3/uL (ref 150–400)
RBC: 2.87 MIL/uL — ABNORMAL LOW (ref 4.22–5.81)
RDW: 15.5 % (ref 11.5–15.5)
WBC: 6.8 10*3/uL (ref 4.0–10.5)

## 2012-11-05 LAB — GLUCOSE, CAPILLARY
Glucose-Capillary: 116 mg/dL — ABNORMAL HIGH (ref 70–99)
Glucose-Capillary: 117 mg/dL — ABNORMAL HIGH (ref 70–99)
Glucose-Capillary: 130 mg/dL — ABNORMAL HIGH (ref 70–99)
Glucose-Capillary: 136 mg/dL — ABNORMAL HIGH (ref 70–99)

## 2012-11-05 LAB — BASIC METABOLIC PANEL
BUN: 34 mg/dL — ABNORMAL HIGH (ref 6–23)
CO2: 23 mEq/L (ref 19–32)
Calcium: 8.3 mg/dL — ABNORMAL LOW (ref 8.4–10.5)
Chloride: 107 mEq/L (ref 96–112)
Creatinine, Ser: 1.54 mg/dL — ABNORMAL HIGH (ref 0.50–1.35)
GFR calc Af Amer: 52 mL/min — ABNORMAL LOW (ref >90)
GFR calc non Af Amer: 45 mL/min — ABNORMAL LOW (ref >90)
Glucose, Bld: 132 mg/dL — ABNORMAL HIGH (ref 70–99)
Potassium: 4.2 mEq/L (ref 3.5–5.1)
Sodium: 137 mEq/L (ref 135–145)

## 2012-11-05 LAB — APTT: aPTT: 80 seconds — ABNORMAL HIGH (ref 24–37)

## 2012-11-05 MED ORDER — CLINIMIX E/DEXTROSE (5/20) 5 % IV SOLN
INTRAVENOUS | Status: AC
  Administered 2012-11-05: 18:00:00 via INTRAVENOUS
  Filled 2012-11-05: qty 2400

## 2012-11-05 MED ORDER — CLINIMIX E/DEXTROSE (5/20) 5 % IV SOLN
INTRAVENOUS | Status: DC
  Filled 2012-11-05: qty 2000

## 2012-11-05 NOTE — Progress Notes (Signed)
Patient ID: Brent Allison, male   DOB: Jun 14, 1946, 66 y.o.   MRN: [id number] [facility] Surgery Progress Note:   * No surgery found *  Problem List: Patient Active Problem List  Diagnosis  . Sleep apnea  . Dyslipidemia  . Multiple myeloma  . Multiple myeloma  . Neuropathy  . DVT (deep venous thrombosis)  . Mesenteric venous thrombosis  . AL amyloidosis  . Heparin induced thrombocytopenia (HIT)   Subjective:  feeling much better.  Flatus and BM this AM.  Objective: Vital signs in last 24 hours: Temp:  [97.1 F (36.2 C)-98.7 F (37.1 C)] 97.9 F (36.6 C) (12/15 0528) Pulse Rate:  [77-94] 94  (12/15 0528) Resp:  [16-20] 20  (12/15 0528) BP: (110-127)/(62-73) 124/65 mmHg (12/15 0528) SpO2:  [97 %-100 %] 98 % (12/15 0528) Physical Exam: nontender abdomen Lab Results:  Results for orders placed during the hospital encounter of 10/27/12 (from the past 48 hour(s))  GLUCOSE, CAPILLARY     Status: Abnormal   Collection Time   11/03/12 11:58 AM      Component Value Range Comment   Glucose-Capillary 118 (*) 70 - 99 mg/dL    Comment 1 Documented in Chart      Comment 2 Notify RN     GLUCOSE, CAPILLARY     Status: Abnormal   Collection Time   11/03/12  5:28 PM      Component Value Range Comment   Glucose-Capillary 120 (*) 70 - 99 mg/dL    Comment 1 Documented in Chart      Comment 2 Notify RN     GLUCOSE, CAPILLARY     Status: Abnormal   Collection Time   11/04/12 12:31 AM      Component Value Range Comment   Glucose-Capillary 147 (*) 70 - 99 mg/dL    Comment 1 Notify RN     GLUCOSE, CAPILLARY     Status: Abnormal   Collection Time   11/04/12  4:51 AM      Component Value Range Comment   Glucose-Capillary 161 (*) 70 - 99 mg/dL   APTT     Status: Abnormal   Collection Time   11/04/12  5:45 AM      Component Value Range Comment   aPTT 71 (*) 24 - 37 seconds   CBC     Status: Abnormal   Collection Time   11/04/12  5:45 AM      Component Value Range Comment   WBC  6.5  4.0 - 10.5 K/uL    RBC 2.76 (*) 4.22 - 5.81 MIL/uL    Hemoglobin 7.8 (*) 13.0 - 17.0 g/dL    HCT 69.6 (*) 29.5 - 52.0 %    MCV 87.7  78.0 - 100.0 fL    MCH 28.3  26.0 - 34.0 pg    MCHC 32.2  30.0 - 36.0 g/dL    RDW 28.4 (*) 13.2 - 15.5 %    Platelets 225  150 - 400 K/uL   BASIC METABOLIC PANEL     Status: Abnormal   Collection Time   11/04/12  5:45 AM      Component Value Range Comment   Sodium 139  135 - 145 mEq/L    Potassium 4.1  3.5 - 5.1 mEq/L    Chloride 108  96 - 112 mEq/L    CO2 23  19 - 32 mEq/L    Glucose, Bld 133 (*) 70 - 99 mg/dL    BUN 34 (*)  6 - 23 mg/dL    Creatinine, Ser 1.61 (*) 0.50 - 1.35 mg/dL    Calcium 8.1 (*) 8.4 - 10.5 mg/dL    GFR calc non Af Amer 47 (*) >90 mL/min    GFR calc Af Amer 55 (*) >90 mL/min   SAMPLE TO BLOOD BANK     Status: Normal   Collection Time   11/04/12  5:45 AM      Component Value Range Comment   Blood Bank Specimen SAMPLE AVAILABLE FOR TESTING      Sample Expiration 11/07/2012     GLUCOSE, CAPILLARY     Status: Abnormal   Collection Time   11/04/12 12:03 PM      Component Value Range Comment   Glucose-Capillary 123 (*) 70 - 99 mg/dL    Comment 1 Notify RN     GLUCOSE, CAPILLARY     Status: Abnormal   Collection Time   11/04/12  5:09 PM      Component Value Range Comment   Glucose-Capillary 101 (*) 70 - 99 mg/dL   GLUCOSE, CAPILLARY     Status: Abnormal   Collection Time   11/04/12 11:54 PM      Component Value Range Comment   Glucose-Capillary 145 (*) 70 - 99 mg/dL    Comment 1 Notify RN     GLUCOSE, CAPILLARY     Status: Abnormal   Collection Time   11/05/12  6:17 AM      Component Value Range Comment   Glucose-Capillary 130 (*) 70 - 99 mg/dL    Comment 1 Notify RN     APTT     Status: Abnormal   Collection Time   11/05/12  6:35 AM      Component Value Range Comment   aPTT 80 (*) 24 - 37 seconds   CBC     Status: Abnormal   Collection Time   11/05/12  6:35 AM      Component Value Range Comment   WBC 6.8   4.0 - 10.5 K/uL    RBC 2.87 (*) 4.22 - 5.81 MIL/uL    Hemoglobin 8.1 (*) 13.0 - 17.0 g/dL    HCT 09.6 (*) 04.5 - 52.0 %    MCV 87.1  78.0 - 100.0 fL    MCH 28.2  26.0 - 34.0 pg    MCHC 32.4  30.0 - 36.0 g/dL    RDW 40.9  81.1 - 91.4 %    Platelets 248  150 - 400 K/uL   BASIC METABOLIC PANEL     Status: Abnormal   Collection Time   11/05/12  6:35 AM      Component Value Range Comment   Sodium 137  135 - 145 mEq/L    Potassium 4.2  3.5 - 5.1 mEq/L    Chloride 107  96 - 112 mEq/L    CO2 23  19 - 32 mEq/L    Glucose, Bld 132 (*) 70 - 99 mg/dL    BUN 34 (*) 6 - 23 mg/dL    Creatinine, Ser 7.82 (*) 0.50 - 1.35 mg/dL    Calcium 8.3 (*) 8.4 - 10.5 mg/dL    GFR calc non Af Amer 45 (*) >90 mL/min    GFR calc Af Amer 52 (*) >90 mL/min    Radiology/Results: No results found. Assessment/Plan: Problem List: Patient Active Problem List  Diagnosis  . Sleep apnea  . Dyslipidemia  . Multiple myeloma  . Multiple myeloma  . Neuropathy  .  DVT (deep venous thrombosis)  . Mesenteric venous thrombosis  . AL amyloidosis  . Heparin induced thrombocytopenia (HIT)   Start clear liquid diet * No surgery found *   LOS: 9 days   Matt B. Daphine Deutscher, MD, [facility] [phone] beeper [phone]  11/05/2012 9:01 AM

## 2012-11-05 NOTE — Progress Notes (Signed)
PARENTERAL NUTRITION CONSULT NOTE - FOLLOW UP  Pharmacy Consult for TNA Indication: Ileus  Allergies  Allergen Reactions  . Heparin Other (See Comments)    Heparin induced thrombocytopenia (SRA +)    Patient Measurements: Height: 6' 0.83" (185 cm) (as recorded earlier 12/6) Weight: 188 lb 7.9 oz (85.5 kg) IBW/kg (Calculated) : 79.52  Adjusted Body Weight: 80 kg Usual Weight: 188-194 lb  Vital Signs: Temp: 97.9 F (36.6 C) (12/15 0528) Temp src: Oral (12/15 0528) BP: 124/65 mmHg (12/15 0528) Pulse Rate: 94  (12/15 0528) Intake/Output from previous day: 12/14 0701 - 12/15 0700 In: 1513.3 [I.V.:743.3; IV Piggyback:50; TPN:720] Out: 1575 [Urine:1575] Intake/Output from this shift:   Labs:  [facility] 11/05/12 0635 11/04/12 0545 11/03/12 0645  WBC 6.8 6.5 6.7  HGB 8.1* 7.8* 7.9*  HCT 25.0* 24.2* 24.2*  PLT 248 225 211  APTT 80* 71* 87*  INR -- -- --    Basename 11/05/12 0635 11/04/12 0545 11/03/12 0645  NA 137 139 138  K 4.2 4.1 3.7  CL 107 108 108  CO2 23 23 23   GLUCOSE 132* 133* 134*  BUN 34* 34* 36*  CREATININE 1.54* 1.48* 1.47*  LABCREA -- -- --  CREAT24HRUR -- -- --  CALCIUM 8.3* 8.1* 8.1*  MG -- -- --  PHOS -- -- --  PROT -- -- --  ALBUMIN -- -- --  AST -- -- --  ALT -- -- --  ALKPHOS -- -- --  BILITOT -- -- --  BILIDIR -- -- --  IBILI -- -- --  PREALBUMIN -- -- --  TRIG -- -- --  CHOLHDL -- -- --  CHOL -- -- --   Estimated Creatinine Clearance: 52.3 ml/min (by C-G formula based on Cr of 1.54).    Basename 11/05/12 0617 11/04/12 2354 11/04/12 1709  GLUCAP 130* 145* 101*    Medications:  Scheduled:     . [EXPIRED] bisacodyl  10 mg Rectal BID  . insulin aspart  0-9 Units Subcutaneous Q6H  . [COMPLETED] metoCLOPramide (REGLAN) injection  10 mg Intravenous Q8H  . pantoprazole (PROTONIX) IV  40 mg Intravenous q morning - 10a  . piperacillin-tazobactam (ZOSYN)  IV  3.375 g Intravenous Q8H  . sodium chloride  10-40 mL Intracatheter Q12H     Insulin Requirements in the past 24 hours:  2 units sensitive SSI CBGs 101-161  Current Nutrition:  NPO Clinimix E 5/20 at 90 ml/hr  IVF: NS with 40 meq of K+ @ 20 ml/hr  Assessment: 66 yo male with plasma cell dyscrasia/amyloidosis scheduled for high dose chemo and stem cell support next week with acute N/V and abdominal pain with CT confirming SMV/portal vein thrombosis with changes of bowel ischemia causing current ileus. TNA started 12/10  Lytes: WNL  Renal: Scr up some, CrCl 52  LFTs: WNL (12/12)  TG: 74 (12/10)  PreAlb: 6 (12/10)  Nutritional Goals:  Per RD: 7829-5621 kCal, 105-130 grams of protein per day Goal rate Clinimix E 5/20 at 100 ml/hr will deliver per day: 120 gm protein and Avg 2300 Kcal/day Lipids, MVI, Trace Elements only on MWF due to national back order.  Plan:   Increase Clinimix E 5/20 to goal rate of 172ml/hr  Lipids MWF d/t national back order  MVI, Trace Elements only on MWF due to national back order.  Full TNA labs Mon/Thurs  Continue CBG q6hr and insulin per sensitive scale.  Gwen Her PharmD  [phone] 11/05/2012 7:26 AM

## 2012-11-05 NOTE — Progress Notes (Signed)
ANTICOAGULATION CONSULT NOTE - Follow Up Consult  Pharmacy Consult for argatroban Indication: pulmonary embolus  Allergies  Allergen Reactions  . Heparin Other (See Comments)    Heparin induced thrombocytopenia (SRA +)    Patient Measurements: Height: 6' 0.83" (185 cm) (as recorded earlier 12/6) Weight: 188 lb 7.9 oz (85.5 kg) IBW/kg (Calculated) : 79.52  Heparin Dosing Weight:   Vital Signs: Temp: 97.9 F (36.6 C) (12/15 0528) Temp src: Oral (12/15 0528) BP: 124/65 mmHg (12/15 0528) Pulse Rate: 94  (12/15 0528)  Labs:  Basename 11/05/12 0635 11/04/12 0545 11/03/12 0645  HGB 8.1* 7.8* --  HCT 25.0* 24.2* 24.2*  PLT 248 225 211  APTT 80* 71* 87*  LABPROT -- -- --  INR -- -- --  HEPARINUNFRC -- -- --  CREATININE 1.54* 1.48* 1.47*  CKTOTAL -- -- --  CKMB -- -- --  TROPONINI -- -- --    Estimated Creatinine Clearance: 52.3 ml/min (by C-G formula based on Cr of 1.54).   Medications:  Infusions:     . sodium chloride 20 mL/hr (10/31/12 1315)  . 0.9 % NaCl with KCl 40 mEq / L 20 mL/hr (11/04/12 0900)  . argatroban 1 mcg/kg/min (11/05/12 0540)  . [EXPIRED] fat emulsion 250 mL (11/03/12 1748)  . [EXPIRED] TPN (CLINIMIX) +/- additives 90 mL/hr at 11/03/12 1748  . TPN (CLINIMIX) +/- additives 90 mL/hr at 11/04/12 1819  . [DISCONTINUED] 0.9 % NaCl with KCl 40 mEq / L 40 mL/hr at 11/03/12 1943    Assessment: 52 YOM recently hospitalized @ [facility] w/ R internal jugular clot 10/23/12 10/25/12 thought to be secondary to pheresis catheter. Pt was started on heparin infusion then transitioned to Lovenox 80mg  sq q12h. Pt presented to [facility] 12/6 with fever, cough and shortness of breath and admitted. Pt found to have RLL PE and was started on Heparin gtt. Pltc fell and concern for HIT so changed to argatroban infusion 10/29/12.  PTT this am within therapeutic range @ 5.1 ml/hr (67mcg/kg/min)  No bleeding reported  Pltc recovered  SRA confirms + HIT   Noted Heme/Onc plan to  potentially change to Arixtra during bowel recovery and no Coumadin planned until full bowel recovery  Goal of Therapy:  aPTT 50-90 seconds Monitor platelets by anticoagulation protocol: Yes   Plan:   Continue argatroban at current rate.    Daily aPTT.   Gwen Her PharmD  [phone] 11/05/2012 7:24 AM

## 2012-11-05 NOTE — Progress Notes (Signed)
Progress Note:  Subjective: Overall improving in all areas. Abdomen less distended. He is moving his bowels. He is hungry. Hemoglobin remained stable at 8.1 g platelets at 248,000. PTT 82 seconds on Argatroban Creatinine remains borderline elevated but stable at 1.5. He is afebrile date 8 Zosyn and we can probably stop antibiotics at this time. I still don't think he is ready to start Coumadin. If we need to transition him I would begin Arixtra subcutaneous daily injections    Vitals: Filed Vitals:   11/05/12 0528  BP: 124/65  Pulse: 94  Temp: 97.9 F (36.6 C)  Resp: 20   Wt Readings from Last 3 Encounters:  10/27/12 188 lb 7.9 oz (85.5 kg)  10/27/12 188 lb 9.6 oz (85.548 kg)  10/26/12 188 lb 8 oz (85.503 kg)     PHYSICAL EXAM:  General at the bed and in chair appears comfortable Head: Normal Eyes: Normal Throat: Neck: Lymph Nodes: Lungs: Clear to auscultation Breasts:  Cardiac: Regular rhythm Abdominal: Less distended. Decreased bowel sounds. Nontender. Extremities: Trace edema Vascular:  No cyanosis Neurologic no focal deficit Skin: No rash or ecchymosis  Labs:   [facility] 11/05/12 0635 11/04/12 0545  WBC 6.8 6.5  HGB 8.1* 7.8*  HCT 25.0* 24.2*  PLT 248 225    Basename 11/05/12 0635 11/04/12 0545  NA 137 139  K 4.2 4.1  CL 107 108  CO2 23 23  GLUCOSE 132* 133*  BUN 34* 34*  CREATININE 1.54* 1.48*  CALCIUM 8.3* 8.1*      Images Studies/Results:   Dg Abd 2 Views  11/03/2012  *RADIOLOGY REPORT*  Clinical Data: Nausea, vomiting, constipation.  ABDOMEN - 2 VIEW  Comparison: CT 10/29/2012  Findings: There are dilated small bowel loops throughout the abdomen with air-fluid levels.  Findings concerning for small bowel obstruction.  This has progressed since prior CT.  No free air.  Bibasilar airspace opacities are noted, likely atelectasis although pneumonia cannot be excluded.  Suspect small bilateral effusions.  No acute bony abnormality.   Postoperative changes in the lumbar spine.  IMPRESSION: New/worsening dilatation of small bowel loops throughout the abdomen with air-fluid levels.  Findings concerning for small bowel obstruction.   Original Report Authenticated By: Charlett Nose, M.D.      Patient Active Problem List  Diagnosis  . Sleep apnea  . Dyslipidemia  . Multiple myeloma  . Multiple myeloma  . Neuropathy  . DVT (deep venous thrombosis)  . Mesenteric venous thrombosis  . AL amyloidosis  . Heparin induced thrombocytopenia (HIT)    Assessment and Plan:  No change from my assessment and plan of a December 14 which I will copy and paste to 2 days note. I would stop his antibiotics. I would not start Coumadin yet.  #1. Complex Coagulopathy:  1 year post RLE DVT related to Revlimid/decadron to Rx amyloidosis  R internal jugular DVT from vascular cathter used to harvest stem cells 10/23/12  Pulmonary embolus/thrombocytoopenia then subsequent mesenteric vascular thrombosis due to heparin induced thrombocytopenia/thrombosis syndrome  Stable on Argatroban/platelets have recovered.  #2. Ileus due to mesenteric vascular insult on TPN. Slowly advancing PO intake  #3. Relapsed Amyloidosis  #4. Acute renal failure - improving  Plan: Continue argatroban anticoagulation.  We may be able to transition to SQ Arixtra while allowing time for his bowel to recover. No coumadin until full bowel recovery.   GRANFORTUNA,JAMES M 11/05/2012, 8:21 AM

## 2012-11-05 NOTE — Progress Notes (Signed)
ANTIBIOTIC CONSULT NOTE - FOLLOW UP  Pharmacy Consult for Zosyn Indication: empiric GI coverage   Allergies  Allergen Reactions  . Heparin Other (See Comments)    Heparin induced thrombocytopenia (SRA +)    Patient Measurements: Height: 6' 0.83" (185 cm) (as recorded earlier 12/6) Weight: 188 lb 7.9 oz (85.5 kg) IBW/kg (Calculated) : 79.52   Vital Signs: Temp: 97.9 F (36.6 C) (12/15 0528) Temp src: Oral (12/15 0528) BP: 124/65 mmHg (12/15 0528) Pulse Rate: 94  (12/15 0528) Intake/Output from previous day: 12/14 0701 - 12/15 0700 In: 1513.3 [I.V.:743.3; IV Piggyback:50; TPN:720] Out: 1575 [Urine:1575] Intake/Output from this shift:    Labs:  [facility] 11/05/12 0635 11/04/12 0545 11/03/12 0645  WBC 6.8 6.5 6.7  HGB 8.1* 7.8* 7.9*  PLT 248 225 211  LABCREA -- -- --  CREATININE 1.54* 1.48* 1.47*   Estimated Creatinine Clearance: 52.3 ml/min (by C-G formula based on Cr of 1.54).   Microbiology: No results found for this or any previous visit (from the past 720 hour(s)).  Anti-infectives     Start     Dose/Rate Route Frequency Ordered Stop   10/30/12 1015  piperacillin-tazobactam (ZOSYN) IVPB 3.375 g       3.375 g 12.5 mL/hr over 240 Minutes Intravenous Every 8 hours 10/30/12 1005            Assessment: Day #7 Zosyn 3.375 g IV q8h, extended interval dosing for GI coverage Scr was elevated but now trending down and stablizing, CrCl 6ml/min WBC were elevated and now WNL Pt is afebrile No Cx  Goal of Therapy:  Appropriate Zosyn dose based on renal fx  Plan:  Continue current dose Duration of therapy per MD  Gwen Her PharmD  [phone] 11/05/2012 8:00 AM

## 2012-11-06 DIAGNOSIS — E8809 Other disorders of plasma-protein metabolism, not elsewhere classified: Secondary | ICD-10-CM

## 2012-11-06 LAB — COMPREHENSIVE METABOLIC PANEL
ALT: 32 U/L (ref 0–53)
AST: 22 U/L (ref 0–37)
Albumin: 2 g/dL — ABNORMAL LOW (ref 3.5–5.2)
Alkaline Phosphatase: 72 U/L (ref 39–117)
BUN: 32 mg/dL — ABNORMAL HIGH (ref 6–23)
CO2: 21 mEq/L (ref 19–32)
Calcium: 8.2 mg/dL — ABNORMAL LOW (ref 8.4–10.5)
Chloride: 104 mEq/L (ref 96–112)
Creatinine, Ser: 1.54 mg/dL — ABNORMAL HIGH (ref 0.50–1.35)
GFR calc Af Amer: 52 mL/min — ABNORMAL LOW (ref >90)
GFR calc non Af Amer: 45 mL/min — ABNORMAL LOW (ref >90)
Glucose, Bld: 133 mg/dL — ABNORMAL HIGH (ref 70–99)
Potassium: 4.4 mEq/L (ref 3.5–5.1)
Sodium: 134 mEq/L — ABNORMAL LOW (ref 135–145)
Total Bilirubin: 0.5 mg/dL (ref 0.3–1.2)
Total Protein: 4.8 g/dL — ABNORMAL LOW (ref 6.0–8.3)

## 2012-11-06 LAB — DIFFERENTIAL
Basophils Absolute: 0 10*3/uL (ref 0.0–0.1)
Basophils Relative: 1 % (ref 0–1)
Eosinophils Absolute: 0.4 10*3/uL (ref 0.0–0.7)
Eosinophils Relative: 5 % (ref 0–5)
Lymphocytes Relative: 8 % — ABNORMAL LOW (ref 12–46)
Lymphs Abs: 0.6 10*3/uL — ABNORMAL LOW (ref 0.7–4.0)
Monocytes Absolute: 1.2 10*3/uL — ABNORMAL HIGH (ref 0.1–1.0)
Monocytes Relative: 16 % — ABNORMAL HIGH (ref 3–12)
Neutro Abs: 5.2 10*3/uL (ref 1.7–7.7)
Neutrophils Relative %: 71 % (ref 43–77)

## 2012-11-06 LAB — APTT: aPTT: 76 seconds — ABNORMAL HIGH (ref 24–37)

## 2012-11-06 LAB — CBC
HCT: 24.8 % — ABNORMAL LOW (ref 39.0–52.0)
Hemoglobin: 8 g/dL — ABNORMAL LOW (ref 13.0–17.0)
MCH: 28 pg (ref 26.0–34.0)
MCHC: 32.3 g/dL (ref 30.0–36.0)
MCV: 86.7 fL (ref 78.0–100.0)
Platelets: 260 10*3/uL (ref 150–400)
RBC: 2.86 MIL/uL — ABNORMAL LOW (ref 4.22–5.81)
RDW: 15.4 % (ref 11.5–15.5)
WBC: 7.3 10*3/uL (ref 4.0–10.5)

## 2012-11-06 LAB — PHOSPHORUS: Phosphorus: 3.6 mg/dL (ref 2.3–4.6)

## 2012-11-06 LAB — CHOLESTEROL, TOTAL: Cholesterol: 84 mg/dL (ref 0–200)

## 2012-11-06 LAB — GLUCOSE, CAPILLARY
Glucose-Capillary: 130 mg/dL — ABNORMAL HIGH (ref 70–99)
Glucose-Capillary: 119 mg/dL — ABNORMAL HIGH (ref 70–99)
Glucose-Capillary: 112 mg/dL — ABNORMAL HIGH (ref 70–99)

## 2012-11-06 LAB — PREALBUMIN: Prealbumin: 15.4 mg/dL — ABNORMAL LOW (ref 17.0–34.0)

## 2012-11-06 LAB — TRIGLYCERIDES: Triglycerides: 30 mg/dL (ref <150)

## 2012-11-06 LAB — MAGNESIUM: Magnesium: 2.1 mg/dL (ref 1.5–2.5)

## 2012-11-06 MED ORDER — WARFARIN - PHARMACIST DOSING INPATIENT: Freq: Every day | Status: DC

## 2012-11-06 MED ORDER — FAT EMULSION 20 % IV EMUL
250.0000 mL | INTRAVENOUS | Status: DC
  Filled 2012-11-06: qty 250

## 2012-11-06 MED ORDER — ENSURE COMPLETE PO LIQD
237.0000 mL | Freq: Two times a day (BID) | ORAL | Status: DC
  Administered 2012-11-07 – 2012-11-13 (×11): 237 mL via ORAL

## 2012-11-06 MED ORDER — WARFARIN SODIUM 4 MG PO TABS
4.0000 mg | ORAL_TABLET | Freq: Once | ORAL | Status: AC
  Administered 2012-11-06: 4 mg via ORAL
  Filled 2012-11-06 (×2): qty 1

## 2012-11-06 MED ORDER — TRACE MINERALS CR-CU-F-FE-I-MN-MO-SE-ZN IV SOLN
INTRAVENOUS | Status: DC
  Filled 2012-11-06: qty 2400

## 2012-11-06 NOTE — Progress Notes (Addendum)
ANTICOAGULATION CONSULT NOTE - Follow Up Consult  Pharmacy Consult for argatroban Indication: pulmonary embolus  Allergies  Allergen Reactions  . Heparin Other (See Comments)    Heparin induced thrombocytopenia (SRA +)    Patient Measurements: Height: 6' 0.83" (185 cm) (as recorded earlier 12/6) Weight: 188 lb 7.9 oz (85.5 kg) IBW/kg (Calculated) : 79.52  Heparin Dosing Weight:   Vital Signs: Temp: 98 F (36.7 C) (12/16 0456) Temp src: Axillary (12/16 0456) BP: 123/64 mmHg (12/16 0456) Pulse Rate: 86  (12/16 0456)  Labs:  Basename 11/06/12 0600 11/05/12 0635 11/04/12 0545  HGB 8.0* 8.1* --  HCT 24.8* 25.0* 24.2*  PLT 260 248 225  APTT 76* 80* 71*  LABPROT -- -- --  INR -- -- --  HEPARINUNFRC -- -- --  CREATININE 1.54* 1.54* 1.48*  CKTOTAL -- -- --  CKMB -- -- --  TROPONINI -- -- --    Estimated Creatinine Clearance: 52.3 ml/min (by C-G formula based on Cr of 1.54).   Medications:  Infusions:     . sodium chloride 20 mL (11/05/12 0915)  . 0.9 % NaCl with KCl 40 mEq / L 20 mL/hr (11/05/12 0945)  . argatroban 1 mcg/kg/min (11/06/12 0010)  . [EXPIRED] TPN (CLINIMIX) +/- additives 90 mL/hr at 11/04/12 1819  . TPN (CLINIMIX) +/- additives 100 mL/hr at 11/05/12 1733  . [DISCONTINUED] TPN [facility]) +/- additives      Assessment: 66yo M recently hospitalized @ [facility] w/ R internal jugular clot 10/23/12 10/25/12 thought to be secondary to pheresis catheter. Pt was started on heparin infusion then transitioned to Lovenox 80mg  sq q12h. Pt presented to [facility] 12/6 with fever, cough and shortness of breath and admitted. Pt found to have RLL PE and was started on Heparin gtt. Pltc fell and concern for HIT so changed to argatroban infusion 10/29/12.  PTT this am within therapeutic range @ 5.1 ml/hr (4mcg/kg/min)  No bleeding reported  Pltc recovered  SRA confirms + HIT   Noted Heme/Onc plan to potentially change to Arixtra during bowel recovery and no Coumadin planned until  full bowel recovery  Goal of Therapy:  aPTT 50-90 seconds Monitor platelets by anticoagulation protocol: Yes   Plan:   Continue argatroban at current rate.    Daily aPTT.   Charolotte Eke, PharmD, pager [phone]. 11/06/2012,7:03 AM.  Addendum:  Order to start Coumadin today. Argatroban has an additive effect on the INR so the goal INR while on Argatroban is >or= 4 then confirming the INR is in the 2-3 range measured at least 4 hrs after Argatroban was stopped.   Plan: Will start with Coumadin 4mg  tonight and f/u daily INR.  Charolotte Eke, PharmD, pager [phone]. 11/06/2012,2:54 PM.

## 2012-11-06 NOTE — Progress Notes (Signed)
IP PROGRESS NOTE  Subjective:  He is tolerating a liquid diet. He is passing flatus and having bowel movements. The abdominal pain has improved. The abdomen remains distended.  Objective: Vital signs in last 24 hours: Blood pressure 133/73, pulse 84, temperature 98.1 F (36.7 C), temperature source Oral, resp. rate 18, height 6' 0.84" (1.85 m), weight 188 lb 7.9 oz (85.5 kg), SpO2 100.00%.  Intake/Output from previous day: 12/15 0701 - 12/16 0700 In: 762.4 [I.V.:762.4] Out: -   Physical Exam:  Lungs: Bronchial sounds at the right lower chest, no respiratory distress Cardiac: Regular rate and rhythm Abdomen: Distended, bowel sounds are present Extremities: Trace pitting edema at the low leg bilaterally Left arm PICC without erythema  Lab Results:  Basename 11/06/12 0600 11/05/12 0635  WBC 7.3 6.8  HGB 8.0* 8.1*  HCT 24.8* 25.0*  PLT 260 248    BMET  Basename 11/06/12 0600 11/05/12 0635  NA 134* 137  K 4.4 4.2  CL 104 107  CO2 21 23  GLUCOSE 133* 132*  BUN 32* 34*  CREATININE 1.54* 1.54*  CALCIUM 8.2* 8.3*   PTT 76   Studies/Results: No results found.  Medications: I have reviewed the patient's current medications.  Assessment/Plan:  1. Acute pulmonary embolism-presumably from a right jugular DVT, now on Argatroban 2. Right neck DVT secondary to a pheresis catheter-hospitalized at [facility] from 10/23/2012 through 10/25/2012 and treated with heparin/Lovenox  3. Thrombocytopenia-resolved, likely related to HIT/ 4. Right lower extremity deep vein thrombosis 02/02/2012, Doppler examination of the legs 10/28/2012 revealed no acute DVT. 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 5. Plasma cell dyscrasia/amyloidosis-scheduled for high-dose chemotherapy and stem cell support at [facility] 6. Acute nausea/vomiting and abdominal pain on 10/28/2012. A CT 10/29/2012 confirmed an SMV/portal vein thrombosis with changes of bowel ischemia. Continue anticoagulation  with Argatroban. The ileus has improved. Advancing diet for surgery. 7. acute renal failure-the urine output has improved and the creatinine has stabilized. Likely contrast/dehydration related nephropathy  8. Nutrition-TNA started 10/31/2012, weaned to off now that he is taking by mouth 9. Heparin induced thrombocytopenia/thromboses-confirmed on a serotonin release assay. He will need long-term anticoagulation therapy   His clinical status has improved. The plan is to advance his diet as tolerated and wean the TNA to off. I will discontinue the antibiotics.  He will need long-term anticoagulation therapy the plan is to start Coumadin. I will discuss the indication for Arixtra versus continuing Argatroban with my colleagues and the coag service at [facility].     LOS: 10 days   Keylen Uzelac, Jillyn Hidden  11/06/2012, 12:21 PM

## 2012-11-06 NOTE — Progress Notes (Signed)
Tolerating clears.  Belly still distended but having flatus.  No nausea  ATTENDING ADDENDUM:  I personally reviewed patient's record, examined the patient, and formulated the following plan:  Will advance to full liquid diet

## 2012-11-06 NOTE — Progress Notes (Signed)
12/16 1400 ambulated hallway tolerated well

## 2012-11-06 NOTE — Progress Notes (Signed)
  Subjective: Feeling better, up and cleaned up, looks better.  Objective: Vital signs in last 24 hours: Temp:  [97.7 F (36.5 C)-98.3 F (36.8 C)] 98.1 F (36.7 C) (12/16 1009) Pulse Rate:  [78-90] 84  (12/16 1009) Resp:  [18-20] 18  (12/16 1009) BP: (113-133)/(61-74) 133/73 mmHg (12/16 1009) SpO2:  [99 %-100 %] 100 % (12/16 1009) Last BM Date: 11/04/12  Intake/Output from previous day: 12/15 0701 - 12/16 0700 In: 762.4 [I.V.:762.4] Out: -  Intake/Output this shift:    General appearance: alert, cooperative and no distress GI: still distended, having flatus  Lab Results:   Basename 11/06/12 0600 11/05/12 0635  WBC 7.3 6.8  HGB 8.0* 8.1*  HCT 24.8* 25.0*  PLT 260 248    BMET  Basename 11/06/12 0600 11/05/12 0635  NA 134* 137  K 4.4 4.2  CL 104 107  CO2 21 23  GLUCOSE 133* 132*  BUN 32* 34*  CREATININE 1.54* 1.54*  CALCIUM 8.2* 8.3*   PT/INR No results found for this basename: LABPROT:2,INR:2 in the last 72 hours   Lab 11/06/12 0600 11/02/12 0500 11/01/12 0500 10/31/12 0540  AST 22 11 14 13   ALT 32 12 13 16   ALKPHOS 72 86 67 77  BILITOT 0.5 0.4 0.4 0.4  PROT 4.8* 5.0* 5.0* 5.2*  ALBUMIN 2.0* 2.1* 2.0* 2.2*     Lipase     Component Value Date/Time   LIPASE 10* 10/28/2012 2253     Studies/Results: No results found.  Medications:    . insulin aspart  0-9 Units Subcutaneous Q6H  . pantoprazole (PROTONIX) IV  40 mg Intravenous q morning - 10a  . piperacillin-tazobactam (ZOSYN)  IV  3.375 g Intravenous Q8H  . sodium chloride  10-40 mL Intracatheter Q12H    Assessment/Plan 1. Acute mesenteric venous thrombosis SMV and PV: Ileus and mesenteric edema and small bowel edema secondary to this.No evidence of bowel infarction this time. Bloody BM last PM.  2.  HIT,Pulmonary embolus/thrombocytoopenia then subsequent mesenteric vascular thrombosis due to heparin induced thrombocytopenia/thrombosis syndrome :  on argatroban now  3. Amyloidosis, possible  multiple myeloma  4. Recent pulmonary embolism (on Argatroban drip)  5. Recent DVT of right IJ at pheresis catheter site  6.Hypokalemia  7.Renal insuffiencey, creatinine improving. 8.Anemia; stable   Plan:  He has been seen by Dr. Maisie Fus, and we will advance his diet to full liquids and see how he does..  Continue TNA till his diet is well tolerated.  Coagulopathy per Hem-Oncology.      LOS: 10 days    Kawehi Hostetter 11/06/2012

## 2012-11-06 NOTE — Progress Notes (Signed)
TNA Consult  Pharmacy received verbal order from Dr. Truett Perna to wean TNA to off today.   Plan: Will d/c current order for TNA and IV Fat Emulsion.  Will d/c all TNA labs.  RN will be notified to change TNA rate to 40 ml/hr now then to off at 1800 tonight.  Pharmacy will defer mIVF adjustment to MD.  Pharmacy will sign off from [facility] consult.  Geoffry Paradise, PharmD, BCPS Pager: [phone] 2:50 PM Pharmacy #: [phone]

## 2012-11-06 NOTE — Progress Notes (Signed)
NUTRITION FOLLOW UP  Intervention:   - Ensure Complete BID - Diet advancement per MD - Will monitor  Nutrition Dx:   Inadequate oral intake r/t inability to eat AEB NPO - no longer appropriate, diet advanced.  New nutrition dx:  Altered GI function r/t ileus (improved) AEB distended abdomen, MD notes.   Goal:   TPN to meet >90% of estimated nutritional needs - not met, TPN being weaned and d/c today.   New goal:  Pt to consume >90% of meals/supplements.   Monitor:   Weights, intake, BM, diet advancement  Assessment:   Pt reports he tolerated the clear liquid diet he was on earlier and is eating well on a full liquid diet, 90% of lunch. Pt reports still having the bloated feeling with his distention, however reports he has been having a daily bowel movement. Plan is to wean TPN and turn off at 1800 tonight.   - CBGs slightly elevated - PALB low at 6 mg/dL on 16/10/96  Height: Ht Readings from Last 1 Encounters:  10/27/12 6' 0.83" (1.85 m)    Weight Status:   Wt Readings from Last 1 Encounters:  10/27/12 188 lb 7.9 oz (85.5 kg)    Re-estimated needs:  Kcal: 2150-2500  Protein: 105-130g  Fluid: 2.1-2.5L   Diet Order: Full Liquid  TPN: Clinimix E 5/20 @ 40 ml/hr.  Lipids (20% IVFE @ 10 ml/hr), multivitamins, and trace elements are provided 3 times weekly (MWF) due to national backorder.  Provides 1051 kcal and 48 grams protein daily (based on weekly average).  Meets 49% minimum estimated kcal and 46% minimum estimated protein needs.  Additional IVF with NS @ 10-20 ml/hr.   Intake/Output Summary (Last 24 hours) at 11/06/12 1546 Last data filed at 11/06/12 0600  Gross per 24 hour  Intake  762.4 ml  Output      0 ml  Net  762.4 ml    Last BM: 12/16 per pt report   Labs:   Lab 11/06/12 0600 11/05/12 0635 11/04/12 0545 11/02/12 0500 11/01/12 0500  NA 134* 137 139 -- --  K 4.4 4.2 4.1 -- --  CL 104 107 108 -- --  CO2 21 23 23  -- --  BUN 32* 34* 34* -- --   CREATININE 1.54* 1.54* 1.48* -- --  CALCIUM 8.2* 8.3* 8.1* -- --  MG 2.1 -- -- 2.5 2.4  PHOS 3.6 -- -- 2.5 2.5  GLUCOSE 133* 132* 133* -- --   CBG (last 3)   Basename 11/06/12 1200 11/06/12 0638 11/05/12 2352  GLUCAP 119* 130* 136*    Scheduled Meds:   . insulin aspart  0-9 Units Subcutaneous Q6H  . pantoprazole (PROTONIX) IV  40 mg Intravenous q morning - 10a  . sodium chloride  10-40 mL Intracatheter Q12H  . warfarin  4 mg Oral ONCE-1800  . Warfarin - Pharmacist Dosing Inpatient   Does not apply q1800   Continuous Infusions:   . sodium chloride 20 mL (11/05/12 0915)  . 0.9 % NaCl with KCl 40 mEq / L 20 mL/hr (11/05/12 0945)  . argatroban 1 mcg/kg/min (11/06/12 1048)  . TPN [facility]) +/- additives 100 mL/hr at 11/05/12 [address] [place], [place] [id number] Pager [phone] After Hours Pager

## 2012-11-06 NOTE — Progress Notes (Signed)
PARENTERAL NUTRITION CONSULT NOTE - FOLLOW UP  Pharmacy Consult for TNA Indication: Ileus  Allergies  Allergen Reactions  . Heparin Other (See Comments)    Heparin induced thrombocytopenia (SRA +)    Patient Measurements: Height: 6' 0.83" (185 cm) (as recorded earlier 12/6) Weight: 188 lb 7.9 oz (85.5 kg) IBW/kg (Calculated) : 79.52  Adjusted Body Weight: 80 kg Usual Weight: 188-194 lb  Vital Signs: Temp: 98 F (36.7 C) (12/16 0456) Temp src: Axillary (12/16 0456) BP: 123/64 mmHg (12/16 0456) Pulse Rate: 86  (12/16 0456) Intake/Output from previous day:   Intake/Output from this shift:   Labs:  [facility] 11/06/12 0600 11/05/12 0635 11/04/12 0545  WBC 7.3 6.8 6.5  HGB 8.0* 8.1* 7.8*  HCT 24.8* 25.0* 24.2*  PLT 260 248 225  APTT 76* 80* 71*  INR -- -- --    Basename 11/06/12 0600 11/05/12 0635 11/04/12 0545  NA 134* 137 139  K 4.4 4.2 4.1  CL 104 107 108  CO2 21 23 23   GLUCOSE 133* 132* 133*  BUN 32* 34* 34*  CREATININE 1.54* 1.54* 1.48*  LABCREA -- -- --  CREAT24HRUR -- -- --  CALCIUM 8.2* 8.3* 8.1*  MG 2.1 -- --  PHOS 3.6 -- --  PROT 4.8* -- --  ALBUMIN 2.0* -- --  AST 22 -- --  ALT 32 -- --  ALKPHOS 72 -- --  BILITOT 0.5 -- --  BILIDIR -- -- --  IBILI -- -- --  PREALBUMIN -- -- --  TRIG -- -- --  CHOLHDL -- -- --  CHOL -- -- --   Estimated Creatinine Clearance: 52.3 ml/min (by C-G formula based on Cr of 1.54).    Basename 11/06/12 0638 11/05/12 2352 11/05/12 1814  GLUCAP 130* 136* 116*    Medications:  Scheduled:     . insulin aspart  0-9 Units Subcutaneous Q6H  . pantoprazole (PROTONIX) IV  40 mg Intravenous q morning - 10a  . piperacillin-tazobactam (ZOSYN)  IV  3.375 g Intravenous Q8H  . sodium chloride  10-40 mL Intracatheter Q12H    Insulin Requirements in the past 24 hours:  2 units sensitive SSI CBGs 130s  Current Nutrition:  CL Clinimix E 5/20 at 90 ml/hr  IVF: NS with 40 meq of K+ @ 20 ml/hr  Assessment: 66yo M with  plasma cell dyscrasia/amyloidosis scheduled for high dose chemo and stem cell support next week with acute N/V and abdominal pain with CT confirming SMV/portal vein thrombosis with changes of bowel ischemia causing current ileus. TNA started 12/10  Lytes: WNL  Renal: SCr elevated but stable.  LFTs: WNL  TG: 74 (12/10)  PreAlb: 6 (12/10)  Nutritional Goals:  Per RD: 9147-8295 kCal, 105-130 grams of protein per day Goal rate Clinimix E 5/20 at 100 ml/hr will deliver per day: 120 gm protein and Avg 2318 Kcal/day Lipids, MVI, Trace Elements only on MWF due to national back order.  Plan:   Cont Clinimix E 5/20 at 143ml/hr  Lipids MWF d/t national back order  MVI, Trace Elements only on MWF due to national back order.  Full TNA labs Mon/Thurs  F/u transition to enteral diet  Continue CBG q6hr and insulin per sensitive scale.  Charolotte Eke, PharmD, pager [phone]. 11/06/2012,7:13 AM.

## 2012-11-07 LAB — GLUCOSE, CAPILLARY: Glucose-Capillary: 95 mg/dL (ref 70–99)

## 2012-11-07 LAB — CBC
HCT: 25.2 % — ABNORMAL LOW (ref 39.0–52.0)
Hemoglobin: 8.1 g/dL — ABNORMAL LOW (ref 13.0–17.0)
MCH: 27.8 pg (ref 26.0–34.0)
MCHC: 32.1 g/dL (ref 30.0–36.0)
MCV: 86.6 fL (ref 78.0–100.0)
Platelets: 248 10*3/uL (ref 150–400)
RBC: 2.91 MIL/uL — ABNORMAL LOW (ref 4.22–5.81)
RDW: 15.4 % (ref 11.5–15.5)
WBC: 6.7 10*3/uL (ref 4.0–10.5)

## 2012-11-07 LAB — BASIC METABOLIC PANEL
BUN: 29 mg/dL — ABNORMAL HIGH (ref 6–23)
CO2: 23 mEq/L (ref 19–32)
Calcium: 8.3 mg/dL — ABNORMAL LOW (ref 8.4–10.5)
Chloride: 105 mEq/L (ref 96–112)
Creatinine, Ser: 1.51 mg/dL — ABNORMAL HIGH (ref 0.50–1.35)
GFR calc Af Amer: 53 mL/min — ABNORMAL LOW (ref >90)
GFR calc non Af Amer: 46 mL/min — ABNORMAL LOW (ref >90)
Glucose, Bld: 87 mg/dL (ref 70–99)
Potassium: 4.3 mEq/L (ref 3.5–5.1)
Sodium: 135 mEq/L (ref 135–145)

## 2012-11-07 LAB — PROTIME-INR
INR: 2.62 — ABNORMAL HIGH (ref 0.00–1.49)
Prothrombin Time: 26.7 seconds — ABNORMAL HIGH (ref 11.6–15.2)

## 2012-11-07 LAB — APTT: aPTT: 70 seconds — ABNORMAL HIGH (ref 24–37)

## 2012-11-07 MED ORDER — PROMETHAZINE HCL 25 MG PO TABS
12.5000 mg | ORAL_TABLET | Freq: Four times a day (QID) | ORAL | Status: DC | PRN
  Administered 2012-11-07 – 2012-11-10 (×3): 12.5 mg via ORAL
  Filled 2012-11-07 (×3): qty 1

## 2012-11-07 MED ORDER — LORAZEPAM 0.5 MG PO TABS: 0.5000 mg | ORAL_TABLET | Freq: Every evening | ORAL | Status: DC | PRN

## 2012-11-07 MED ORDER — WARFARIN SODIUM 2 MG PO TABS
2.0000 mg | ORAL_TABLET | Freq: Once | ORAL | Status: AC
  Administered 2012-11-07: 2 mg via ORAL
  Filled 2012-11-07: qty 1

## 2012-11-07 MED ORDER — PANTOPRAZOLE SODIUM 40 MG PO TBEC
40.0000 mg | DELAYED_RELEASE_TABLET | Freq: Every day | ORAL | Status: DC
  Administered 2012-11-08 – 2012-11-09 (×2): 40 mg via ORAL
  Filled 2012-11-07 (×3): qty 1

## 2012-11-07 NOTE — Progress Notes (Signed)
  Subjective: Up in chair again eating grits, and yougart, wants bacon and a biscuit  Objective: Vital signs in last 24 hours: Temp:  [97.7 F (36.5 C)-98.1 F (36.7 C)] 98 F (36.7 C) (12/17 0657) Pulse Rate:  [71-87] 78  (12/17 0657) Resp:  [16-20] 20  (12/17 0657) BP: (100-133)/(61-73) 106/67 mmHg (12/17 0657) SpO2:  [100 %] 100 % (12/17 0657) Last BM Date: 11/07/12  PO intake not recorded. Full liquids,  Afebrile, VSS, labs stable Intake/Output from previous day: 12/16 0701 - 12/17 0700 In: 787.5 [I.V.:787.5] Out: -  Intake/Output this shift:    GI: still mildly distended, not tender, few BS, +BM.  Lab Results:   Basename 11/07/12 0610 11/06/12 0600  WBC 6.7 7.3  HGB 8.1* 8.0*  HCT 25.2* 24.8*  PLT 248 260    BMET  Basename 11/07/12 0610 11/06/12 0600  NA 135 134*  K 4.3 4.4  CL 105 104  CO2 23 21  GLUCOSE 87 133*  BUN 29* 32*  CREATININE 1.51* 1.54*  CALCIUM 8.3* 8.2*   PT/INR  Basename 11/07/12 0610  LABPROT 26.7*  INR 2.62*     Lab 11/06/12 0600 11/02/12 0500 11/01/12 0500  AST 22 11 14   ALT 32 12 13  ALKPHOS 72 86 67  BILITOT 0.5 0.4 0.4  PROT 4.8* 5.0* 5.0*  ALBUMIN 2.0* 2.1* 2.0*     Lipase     Component Value Date/Time   LIPASE 10* 10/28/2012 2253     Studies/Results: No results found.  Medications:    . feeding supplement  237 mL Oral BID BM  . insulin aspart  0-9 Units Subcutaneous Q6H  . pantoprazole (PROTONIX) IV  40 mg Intravenous q morning - 10a  . sodium chloride  10-40 mL Intracatheter Q12H  . warfarin  2 mg Oral ONCE-1800  . Warfarin - Pharmacist Dosing Inpatient   Does not apply q1800    Assessment/Plan 1. Acute mesenteric venous thrombosis SMV and PV: Ileus and mesenteric edema and small bowel edema secondary to this.No evidence of bowel infarction this time. Bloody BM last PM.  2. HIT,Pulmonary embolus/thrombocytoopenia then subsequent mesenteric vascular thrombosis due to heparin induced  thrombocytopenia/thrombosis syndrome : on argatroban now  3. Amyloidosis, possible multiple myeloma  4. Recent pulmonary embolism (on Argatroban drip)  5. Recent DVT of right IJ at pheresis catheter site  6.Hypokalemia  7.Renal insuffiencey, creatinine improving.  8.Anemia; stable  Plan:  Agree with weaning TNA, will advance to low residual diet.   LOS: 11 days    Brent Allison 11/07/2012

## 2012-11-07 NOTE — Progress Notes (Signed)
ANTICOAGULATION CONSULT NOTE - Follow Up Consult  Pharmacy Consult for argatroban/Coumadin Indication: pulmonary embolus  Allergies  Allergen Reactions  . Heparin Other (See Comments)    Heparin induced thrombocytopenia (SRA +)    Patient Measurements: Height: 6' 0.83" (185 cm) (as recorded earlier 12/6) Weight: 188 lb 7.9 oz (85.5 kg) IBW/kg (Calculated) : 79.52   Vital Signs: Temp: 98 F (36.7 C) (12/17 0657) Temp src: Oral (12/17 0657) BP: 106/67 mmHg (12/17 0657) Pulse Rate: 78  (12/17 0657)  Labs:  Basename 11/07/12 0610 11/06/12 0600 11/05/12 0635  HGB 8.1* 8.0* --  HCT 25.2* 24.8* 25.0*  PLT 248 260 248  APTT 70* 76* 80*  LABPROT 26.7* -- --  INR 2.62* -- --  HEPARINUNFRC -- -- --  CREATININE 1.51* 1.54* 1.54*  CKTOTAL -- -- --  CKMB -- -- --  TROPONINI -- -- --    Estimated Creatinine Clearance: 53.4 ml/min (by C-G formula based on Cr of 1.51).  Medications:  Infusions:     . sodium chloride 20 mL (11/05/12 0915)  . 0.9 % NaCl with KCl 40 mEq / L 20 mL/hr (11/05/12 0945)  . argatroban 1 mcg/kg/min (11/07/12 0803)  . [EXPIRED] TPN (CLINIMIX) +/- additives 100 mL/hr at 11/05/12 1733  . [DISCONTINUED] fat emulsion    . [DISCONTINUED] TPN [facility]) +/- additives      Assessment: 66yo M recently hospitalized @ [facility] w/ R internal jugular clot 10/23/12 10/25/12 thought to be secondary to pheresis catheter. Pt was started on heparin infusion then transitioned to Lovenox 80mg  sq q12h. Pt presented to [facility] 12/6 with fever, cough and shortness of breath and admitted. Pt found to have RLL PE and was started on Heparin gtt. Pltc fell and concern for HIT so changed to argatroban infusion 10/29/12.   Ileus resolved so Coumadin started 12/16 - 4mg  given last night  PTT this am within therapeutic range @ 5.1 ml/hr (61mcg/kg/min)  Argatroban has an additive effect on the INR so the goal INR while on Argatroban is >or= 4. Once INR is >/= 4 then argatroban can be stopped and  will confirm that INR is 2-3 4 hrs after Argatroban stopped.   INR today is 2.62  No bleeding reported  Pltc recovered; SRA confirms + HIT   Goal of Therapy:  aPTT 50-90 seconds Monitor platelets by anticoagulation protocol: Yes   Plan:   Continue argatroban at current rate.    Coumadin 2 mg today  Daily aPTT. Daily INR  Gwen Her PharmD  [phone] 11/07/2012 8:26 AM

## 2012-11-07 NOTE — Progress Notes (Signed)
Tolerating diet, having BM's, no pain or nausea  ATTENDING ADDENDUM:  I personally reviewed patient's record, examined the patient, and formulated the following plan:  Will start low residue diet.  If tolerates this, ok to go home from surgery standpoint.  Getting Coumadin.  He should continue on low residue diet for 2 weeks.

## 2012-11-07 NOTE — Progress Notes (Signed)
IP PROGRESS NOTE  Subjective:  He tolerated a full diet without difficulty. He continues to have bowel movements. The abdominal pain has improved. No emesis.  Objective: Vital signs in last 24 hours: Blood pressure 108/55, pulse 81, temperature 97.7 F (36.5 C), temperature source Oral, resp. rate 20, height 6' 0.84" (1.85 m), weight 188 lb 7.9 oz (85.5 kg), SpO2 99.00%.  Intake/Output from previous day: 12/16 0701 - 12/17 0700 In: 787.5 [I.V.:787.5] Out: -   Physical Exam:  Lungs: Bronchial sounds at the right lower chest, no respiratory distress Cardiac: Regular rate and rhythm Abdomen: Distended, soft, nontender Extremities: Trace pitting edema at the low leg bilaterally Left arm PICC without erythema  Lab Results:  Basename 11/07/12 0610 11/06/12 0600  WBC 6.7 7.3  HGB 8.1* 8.0*  HCT 25.2* 24.8*  PLT 248 260    BMET  Basename 11/07/12 0610 11/06/12 0600  NA 135 134*  K 4.3 4.4  CL 105 104  CO2 23 21  GLUCOSE 87 133*  BUN 29* 32*  CREATININE 1.51* 1.54*  CALCIUM 8.3* 8.2*   PTT 70  Studies/Results: No results found.  Medications: I have reviewed the patient's current medications.  Assessment/Plan:  1. Acute pulmonary embolism-presumably from a right jugular DVT, now on Argatroban 2. Right neck DVT secondary to apheresis catheter-hospitalized at [facility] from 10/23/2012 through 10/25/2012 and treated with heparin/Lovenox  3. Thrombocytopenia-resolved, likely related to HIT 4. Right lower extremity deep vein thrombosis 02/02/2012, Doppler examination of the legs 10/28/2012 revealed no acute DVT. 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 5. Plasma cell dyscrasia/amyloidosis-scheduled for high-dose chemotherapy and stem cell support at [facility] 6. Acute nausea/vomiting and abdominal pain on 10/28/2012. A CT 10/29/2012 confirmed an SMV/portal vein thrombosis with changes of bowel ischemia. Continue anticoagulation with Argatroban. The ileus has  improved. Advancing diet per surgery. 7. acute renal failure-the creatinine has stabilized, likely contrast/dehydration related nephropathy  8. Nutrition-TNA started 10/31/2012, weaned to off 11/06/2012 9. Heparin induced thrombocytopenia/thromboses-confirmed on a serotonin release assay. He will need long-term anticoagulation therapy , Coumadin started 11/06/2012  His clinical status has improved. His diet has been advanced by surgery.  He will need long-term anticoagulation therapy with Coumadin. I placed a call to the [facility] coagulation service to discuss the indication for bridging with Arixtra. Continue Argatroban for now.   LOS: 11 days   Alinah Sheard  11/07/2012, 3:25 PM

## 2012-11-08 LAB — BASIC METABOLIC PANEL
BUN: 23 mg/dL (ref 6–23)
CO2: 21 mEq/L (ref 19–32)
Calcium: 8.1 mg/dL — ABNORMAL LOW (ref 8.4–10.5)
Chloride: 107 mEq/L (ref 96–112)
Creatinine, Ser: 1.56 mg/dL — ABNORMAL HIGH (ref 0.50–1.35)
GFR calc Af Amer: 51 mL/min — ABNORMAL LOW (ref >90)
GFR calc non Af Amer: 44 mL/min — ABNORMAL LOW (ref >90)
Glucose, Bld: 92 mg/dL (ref 70–99)
Potassium: 4.2 mEq/L (ref 3.5–5.1)
Sodium: 134 mEq/L — ABNORMAL LOW (ref 135–145)

## 2012-11-08 LAB — CBC
HCT: 24.6 % — ABNORMAL LOW (ref 39.0–52.0)
Hemoglobin: 8 g/dL — ABNORMAL LOW (ref 13.0–17.0)
MCH: 27.9 pg (ref 26.0–34.0)
MCHC: 32.5 g/dL (ref 30.0–36.0)
MCV: 85.7 fL (ref 78.0–100.0)
Platelets: 265 10*3/uL (ref 150–400)
RBC: 2.87 MIL/uL — ABNORMAL LOW (ref 4.22–5.81)
RDW: 15.3 % (ref 11.5–15.5)
WBC: 6.6 10*3/uL (ref 4.0–10.5)

## 2012-11-08 LAB — APTT: aPTT: 72 seconds — ABNORMAL HIGH (ref 24–37)

## 2012-11-08 LAB — PROTIME-INR
INR: 2.3 — ABNORMAL HIGH (ref 0.00–1.49)
Prothrombin Time: 24.3 seconds — ABNORMAL HIGH (ref 11.6–15.2)

## 2012-11-08 MED ORDER — WARFARIN SODIUM 4 MG PO TABS
4.0000 mg | ORAL_TABLET | Freq: Once | ORAL | Status: DC
  Filled 2012-11-08: qty 1

## 2012-11-08 MED ORDER — WARFARIN VIDEO
Freq: Once | Status: AC
  Administered 2012-11-08: 13:00:00

## 2012-11-08 MED ORDER — WARFARIN SODIUM 7.5 MG PO TABS
7.5000 mg | ORAL_TABLET | Freq: Once | ORAL | Status: AC
  Administered 2012-11-08: 7.5 mg via ORAL
  Filled 2012-11-08: qty 1

## 2012-11-08 MED ORDER — COUMADIN BOOK
Freq: Once | Status: AC
  Administered 2012-11-08: 10:00:00
  Filled 2012-11-08: qty 1

## 2012-11-08 NOTE — Progress Notes (Addendum)
ANTICOAGULATION CONSULT NOTE - Follow Up Consult  Pharmacy Consult for argatroban/Coumadin Indication: pulmonary embolus  Allergies  Allergen Reactions  . Heparin Other (See Comments)    Heparin induced thrombocytopenia (SRA +)    Patient Measurements: Height: 6' 0.83" (185 cm) (as recorded earlier 12/6) Weight: 188 lb 7.9 oz (85.5 kg) IBW/kg (Calculated) : 79.52   Vital Signs: Temp: 97.7 F (36.5 C) (12/18 0635) Temp src: Oral (12/18 0635) BP: 114/62 mmHg (12/18 0635) Pulse Rate: 91  (12/18 0635)  Labs:  Basename 11/08/12 0403 11/07/12 0610 11/06/12 0600  HGB 8.0* 8.1* --  HCT 24.6* 25.2* 24.8*  PLT 265 248 260  APTT 72* 70* 76*  LABPROT 24.3* 26.7* --  INR 2.30* 2.62* --  HEPARINUNFRC -- -- --  CREATININE 1.56* 1.51* 1.54*  CKTOTAL -- -- --  CKMB -- -- --  TROPONINI -- -- --    Estimated Creatinine Clearance: 51.7 ml/min (by C-G formula based on Cr of 1.56).  Medications:  Infusions:     . sodium chloride 20 mL (11/05/12 0915)  . 0.9 % NaCl with KCl 40 mEq / L 20 mL/hr (11/07/12 1143)  . argatroban 1 mcg/kg/min (11/08/12 0815)    Assessment: 66yo M recently hospitalized @ [facility] w/ R internal jugular clot 10/23/12 10/25/12 thought to be secondary to pheresis catheter. Pt was started on heparin infusion then transitioned to Lovenox 80mg  sq q12h. Pt presented to [facility] 12/6 with fever, cough and shortness of breath and admitted. Pt found to have RLL PE and was started on Heparin gtt. Pltc fell and concern for HIT so changed to argatroban infusion 10/29/12.   Ileus resolved so Coumadin started 12/16 - 4mg  given last night  PTT this am within therapeutic range @ 5.1 ml/hr (12mcg/kg/min)  Argatroban has an additive effect on the INR so the goal INR while on Argatroban is >or= 4. Once INR is >/= 4 then argatroban can be stopped and will confirm that INR is 2-3 4 hrs after Argatroban stopped.   INR today is 2.3  Day 3/5 of minimum overlap with argatroban, argatroban will  need to be continued until INR > 4 x 24 hours.   No bleeding reported  Pltc recovered; SRA confirms + HIT   Goal of Therapy:  aPTT 50-90 seconds Monitor platelets by anticoagulation protocol: Yes   Plan:   Continue argatroban at current rate.    Daily aPTT. Daily INR  Tammy Sours, Pharm.D. Clinical Oncology Pharmacist  Pager # [phone]  11/08/2012 8:31 AM    ADDENDUM: Spoke with patient who stated he had been on Coumadin in the past, and had issues with getting his INR therapeutic. Past Coumadin dose was 5 mg daily except 7.5 mg on MWF. Give 7.5 mg of Coumadin tonight. Will f/u in the AM  [facility], Pharm.D. Clinical Oncology Pharmacist  Pager # [phone]  11/08/2012 3:28 PM

## 2012-11-08 NOTE — Progress Notes (Signed)
  Subjective: He had some reflux last Pm, but better this AM, eating a regular breakfast, he says his bowel are moving also. (not recorded on i/o.  Objective: Vital signs in last 24 hours: Temp:  [97.5 F (36.4 C)-97.9 F (36.6 C)] 97.7 F (36.5 C) (12/18 0635) Pulse Rate:  [75-91] 91  (12/18 0635) Resp:  [16-20] 16  (12/18 0635) BP: (108-117)/(55-65) 114/62 mmHg (12/18 0635) SpO2:  [99 %-100 %] 100 % (12/18 0635) Last BM Date: 11/07/12   I/O=? Afebrile, VSS, labs stable Intake/Output from previous day: 12/17 0701 - 12/18 0700 In: 574.4 [I.V.:574.4] Out: -  Intake/Output this shift:    General appearance: alert, cooperative and no distress GI: soft, still distended some, up in chair, +BS, +BM.  Lab Results:   [facility] 11/08/12 0403 11/07/12 0610  WBC 6.6 6.7  HGB 8.0* 8.1*  HCT 24.6* 25.2*  PLT 265 248    BMET  Basename 11/08/12 0403 11/07/12 0610  NA 134* 135  K 4.2 4.3  CL 107 105  CO2 21 23  GLUCOSE 92 87  BUN 23 29*  CREATININE 1.56* 1.51*  CALCIUM 8.1* 8.3*   PT/INR  Basename 11/08/12 0403 11/07/12 0610  LABPROT 24.3* 26.7*  INR 2.30* 2.62*     Lab 11/06/12 0600 11/02/12 0500  AST 22 11  ALT 32 12  ALKPHOS 72 86  BILITOT 0.5 0.4  PROT 4.8* 5.0*  ALBUMIN 2.0* 2.1*     Lipase     Component Value Date/Time   LIPASE 10* 10/28/2012 2253     Studies/Results: No results found.  Medications:    . feeding supplement  237 mL Oral BID BM  . pantoprazole  40 mg Oral Daily  . sodium chloride  10-40 mL Intracatheter Q12H  . warfarin  4 mg Oral ONCE-1800  . Warfarin - Pharmacist Dosing Inpatient   Does not apply q1800    Assessment/Plan 1. Acute mesenteric venous thrombosis SMV and PV: Ileus and mesenteric edema and small bowel edema secondary to this.No evidence of bowel infarction this time. Bloody BM last PM.  2. HIT,Pulmonary embolus/thrombocytoopenia then subsequent mesenteric vascular thrombosis due to heparin induced  thrombocytopenia/thrombosis syndrome : on argatroban now  3. Amyloidosis, possible multiple myeloma  4. Recent pulmonary embolism (on Argatroban drip)  5. Recent DVT of right IJ at pheresis catheter site  6.Hypokalemia  7.Renal insuffiencey, creatinine improving.  8.Anemia; stable   Plan:  He is currently tolerating low fiber diet, and having BM's, ileus appears to be better.  DR Maisie Fus recommends he stay on this for at least 2 weeks.  He thinks he may go home tomorrow.  Please call if we can be of any further assistance.  LOS: 12 days    Brent Allison 11/08/2012

## 2012-11-08 NOTE — Progress Notes (Signed)
Doing well.  Tolerating diet.  Having BM's  ATTENDING ADDENDUM:  I personally reviewed patient's record, examined the patient, and formulated the following plan:  Doing well.  No surgical indications at this time.  Continue low residue diet at home for 2 weeks while bowel edema resolves.  Will sign off.  Please call with questions or concerns.

## 2012-11-08 NOTE — Progress Notes (Signed)
IP PROGRESS NOTE  Subjective:  He is tolerating a diet, but continues to have "indigestion ". The pain and dyspnea have improved.  Objective: Vital signs in last 24 hours: Blood pressure 130/67, pulse 90, temperature 97.8 F (36.6 C), temperature source Oral, resp. rate 20, height 6' 0.84" (1.85 m), weight 188 lb 7.9 oz (85.5 kg), SpO2 98.00%.  Intake/Output from previous day: 12/17 0701 - 12/18 0700 In: 574.4 [I.V.:574.4] Out: -   Physical Exam:  Lungs: Decreased breath sounds at the lower chest with and inspiratory rales, no respiratory distress Cardiac: Regular rate and rhythm Abdomen: Distended, soft, nontender Extremities: Trace pitting edema at the low leg bilaterally Left arm PICC without erythema  Lab Results:  [facility] 11/08/12 0403 11/07/12 0610  WBC 6.6 6.7  HGB 8.0* 8.1*  HCT 24.6* 25.2*  PLT 265 248    BMET  Basename 11/08/12 0403 11/07/12 0610  NA 134* 135  K 4.2 4.3  CL 107 105  CO2 21 23  GLUCOSE 92 87  BUN 23 29*  CREATININE 1.56* 1.51*  CALCIUM 8.1* 8.3*   PTT -72   Studies/Results: No results found.  Medications: I have reviewed the patient's current medications.  Assessment/Plan:  1. Acute pulmonary embolism-presumably from a right jugular DVT, now on Argatroban 2. Right neck DVT secondary to apheresis catheter-hospitalized at [facility] from 10/23/2012 through 10/25/2012 and treated with heparin/Lovenox  3. Thrombocytopenia-resolved, likely related to HIT 4. Right lower extremity deep vein thrombosis 02/02/2012, Doppler examination of the legs 10/28/2012 revealed no acute DVT. 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 5. Plasma cell dyscrasia/amyloidosis-scheduled for high-dose chemotherapy and stem cell support at [facility], delayed secondary to the current venous thromboembolic disease. 6. Acute nausea/vomiting and abdominal pain on 10/28/2012. A CT 10/29/2012 confirmed an SMV/portal vein thrombosis with changes of bowel  ischemia. Continue anticoagulation with Argatroban. The ileus has improved. Now on a low residual diet. 7. acute renal failure-the creatinine has stabilized, likely contrast/dehydration related nephropathy  8. Nutrition-TNA started 10/31/2012, weaned to off 11/06/2012 9. Heparin induced thrombocytopenia/thromboses-confirmed on a serotonin release assay. He will need long-term anticoagulation therapy , Coumadin started 11/06/2012  His clinical status continues to improve. He would like to go home. Coumadin has been started and he is maintained on Argatroban. I discussed the case with the coagulation service at [facility]. There is a precedence for using Arixtra in this setting. I am concerned about the potential life-threatening nature of of any further thromboembolic events in his case. His renal function may also limit the use of Arixtra. I recommend continuing our Argatroban for now with an overlap of at least 2 days in the INR is therapeutic.   LOS: 12 days   Zandria Woldt  11/08/2012, 2:47 PM

## 2012-11-09 LAB — BASIC METABOLIC PANEL
BUN: 19 mg/dL (ref 6–23)
CO2: 23 mEq/L (ref 19–32)
Calcium: 8.4 mg/dL (ref 8.4–10.5)
Chloride: 107 mEq/L (ref 96–112)
Creatinine, Ser: 1.61 mg/dL — ABNORMAL HIGH (ref 0.50–1.35)
GFR calc Af Amer: 49 mL/min — ABNORMAL LOW (ref >90)
GFR calc non Af Amer: 43 mL/min — ABNORMAL LOW (ref >90)
Glucose, Bld: 106 mg/dL — ABNORMAL HIGH (ref 70–99)
Potassium: 4 mEq/L (ref 3.5–5.1)
Sodium: 136 mEq/L (ref 135–145)

## 2012-11-09 LAB — APTT: aPTT: 70 seconds — ABNORMAL HIGH (ref 24–37)

## 2012-11-09 LAB — PROTIME-INR
INR: 2.14 — ABNORMAL HIGH (ref 0.00–1.49)
Prothrombin Time: 23 seconds — ABNORMAL HIGH (ref 11.6–15.2)

## 2012-11-09 MED ORDER — WARFARIN SODIUM 10 MG PO TABS
10.0000 mg | ORAL_TABLET | Freq: Once | ORAL | Status: AC
  Administered 2012-11-09: 10 mg via ORAL
  Filled 2012-11-09: qty 1

## 2012-11-09 NOTE — Progress Notes (Signed)
IP PROGRESS NOTE  Subjective:  He is tolerating a low residual diet. The "indigestion "has improved  Objective: Vital signs in last 24 hours: Blood pressure 123/62, pulse 74, temperature 98.3 F (36.8 C), temperature source Oral, resp. rate 18, height 6' 0.84" (1.85 m), weight 188 lb 7.9 oz (85.5 kg), SpO2 100.00%.  Intake/Output from previous day: 12/18 0701 - 12/19 0700 In: 590.7 [I.V.:590.7] Out: -   Physical Exam:  Lungs: Bronchial sounds at the right lower chest, no respiratory distress Cardiac: Regular rate and rhythm Abdomen: Distended, soft, nontender Extremities: Trace pitting edema at the low leg bilaterally Left arm PICC without erythema  Lab Results:  [facility] 11/08/12 0403 11/07/12 0610  WBC 6.6 6.7  HGB 8.0* 8.1*  HCT 24.6* 25.2*  PLT 265 248    BMET  Basename 11/09/12 0405 11/08/12 0403  NA 136 134*  K 4.0 4.2  CL 107 107  CO2 23 21  GLUCOSE 106* 92  BUN 19 23  CREATININE 1.61* 1.56*  CALCIUM 8.4 8.1*   PTT - 70 PT/INR-2.14   Studies/Results: No results found.  Medications: I have reviewed the patient's current medications.  Assessment/Plan:  1. Acute pulmonary embolism-presumably from a right jugular DVT, now on Argatroban 2. Right neck DVT secondary to apheresis catheter-hospitalized at [facility] from 10/23/2012 through 10/25/2012 and treated with heparin/Lovenox  3. Thrombocytopenia-resolved, likely related to HIT 4. Right lower extremity deep vein thrombosis 02/02/2012, Doppler examination of the legs 10/28/2012 revealed no acute DVT. 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 5. Plasma cell dyscrasia/amyloidosis-scheduled for high-dose chemotherapy and stem cell support at [facility], delayed secondary to the current venous thromboembolic disease. 6. Acute nausea/vomiting and abdominal pain on 10/28/2012. A CT 10/29/2012 confirmed an SMV/portal vein thrombosis with changes of bowel ischemia. Continue anticoagulation with  Argatroban. The ileus has improved. Now on a low residual diet. 7. acute renal failure-the creatinine has stabilized, likely contrast/dehydration related nephropathy  8. Nutrition-TNA started 10/31/2012, weaned to off 11/06/2012 9. Heparin induced thrombocytopenia/thromboses-confirmed on a serotonin release assay. He will need long-term anticoagulation therapy , Coumadin started 11/06/2012-the INR remains subtherapeutic.  I discussed the risk/benefit of continuing Argatroban versus a switch to Arixtra with Brent Allison. He agrees to continuing the Argatroban until the PT/INR is therapeutic. The Coumadin dose will be increased today.   LOS: 13 days   Brent Allison  11/09/2012, 2:16 PM

## 2012-11-09 NOTE — Progress Notes (Signed)
ANTICOAGULATION CONSULT NOTE - Follow Up Consult  Pharmacy Consult for Argatroban/Coumadin Indication: pulmonary embolus  Allergies  Allergen Reactions  . Heparin Other (See Comments)    Heparin induced thrombocytopenia (SRA +)    Patient Measurements: Height: 6' 0.83" (185 cm) (as recorded earlier 12/6) Weight: 188 lb 7.9 oz (85.5 kg) IBW/kg (Calculated) : 79.52   Vital Signs: Temp: 98.3 F (36.8 C) (12/19 0611) Temp src: Oral (12/19 0611) BP: 118/74 mmHg (12/19 0611) Pulse Rate: 86  (12/19 0611)  Labs:  Basename 11/09/12 0405 11/08/12 0403 11/07/12 0610  HGB -- 8.0* 8.1*  HCT -- 24.6* 25.2*  PLT -- 265 248  APTT 70* 72* 70*  LABPROT 23.0* 24.3* 26.7*  INR 2.14* 2.30* 2.62*  HEPARINUNFRC -- -- --  CREATININE 1.61* 1.56* 1.51*  CKTOTAL -- -- --  CKMB -- -- --  TROPONINI -- -- --    Estimated Creatinine Clearance: 50.1 ml/min (by C-G formula based on Cr of 1.61).  Medications:  Infusions:     . sodium chloride 20 mL (11/05/12 0915)  . 0.9 % NaCl with KCl 40 mEq / L 20 mL/hr (11/07/12 1143)  . argatroban 1 mcg/kg/min (11/09/12 0123)    Assessment: 66yo M recently hospitalized @ [facility] w/ R internal jugular clot 10/23/12 10/25/12 thought to be secondary to pheresis catheter. Pt was started on heparin infusion then transitioned to Lovenox 80mg  sq q12h. Pt presented to [facility] 12/6 with fever, cough and shortness of breath and admitted. Pt found to have RLL PE and was started on Heparin gtt. Pltc fell and concern for HIT so changed to argatroban infusion 10/29/12.  Ileus resolved   Coumadin dosing (12/16-12/18): 4 mg ,2 mg, 7.5 mg  PTT this am within therapeutic range @ 5.1 ml/hr (24mcg/kg/min)  Argatroban has an additive effect on the INR so the goal INR while on Argatroban is >or= 4. Once INR is >/= 4 then argatroban can be stopped and will confirm that INR is 2-3 4 hrs after Argatroban stopped.   INR today is 2.14  Day 4/5 of minimum overlap with argatroban,  argatroban will need to be continued until INR > 4 x 24 hours.   No bleeding reported  Pltc recovered; SRA confirms + HIT   Spoke with patient who stated he had been on Coumadin in the past, and had issues with getting his INR therapeutic. Past Coumadin dose was 5 mg daily except 7.5 mg on MWF.   Goal of Therapy:  aPTT 50-90 seconds INR > 4 while on argatroban, INR 2-3 when off argatroban Monitor platelets by anticoagulation protocol: Yes   Plan:   Continue argatroban at current rate.   Coumadin 10 mg x 1 tonight - MD agrees with dose   Daily aPTT. Daily INR  Tammy Sours, Pharm.D. Clinical Oncology Pharmacist  Pager # [phone]  11/09/2012 8:04 AM

## 2012-11-10 DIAGNOSIS — I82409 Acute embolism and thrombosis of unspecified deep veins of unspecified lower extremity: Secondary | ICD-10-CM

## 2012-11-10 LAB — BASIC METABOLIC PANEL
BUN: 18 mg/dL (ref 6–23)
CO2: 24 mEq/L (ref 19–32)
Calcium: 8.2 mg/dL — ABNORMAL LOW (ref 8.4–10.5)
Chloride: 107 mEq/L (ref 96–112)
Creatinine, Ser: 1.54 mg/dL — ABNORMAL HIGH (ref 0.50–1.35)
GFR calc Af Amer: 52 mL/min — ABNORMAL LOW (ref >90)
GFR calc non Af Amer: 45 mL/min — ABNORMAL LOW (ref >90)
Glucose, Bld: 95 mg/dL (ref 70–99)
Potassium: 4.2 mEq/L (ref 3.5–5.1)
Sodium: 137 mEq/L (ref 135–145)

## 2012-11-10 LAB — APTT: aPTT: 67 seconds — ABNORMAL HIGH (ref 24–37)

## 2012-11-10 LAB — PROTIME-INR
INR: 2.65 — ABNORMAL HIGH (ref 0.00–1.49)
Prothrombin Time: 27 seconds — ABNORMAL HIGH (ref 11.6–15.2)

## 2012-11-10 MED ORDER — SENNOSIDES-DOCUSATE SODIUM 8.6-50 MG PO TABS
1.0000 | ORAL_TABLET | Freq: Every day | ORAL | Status: DC
  Administered 2012-11-10 – 2012-11-13 (×4): 1 via ORAL
  Filled 2012-11-10 (×4): qty 1

## 2012-11-10 MED ORDER — WARFARIN SODIUM 10 MG PO TABS
10.0000 mg | ORAL_TABLET | Freq: Once | ORAL | Status: AC
  Administered 2012-11-10: 10 mg via ORAL
  Filled 2012-11-10: qty 1

## 2012-11-10 NOTE — Progress Notes (Addendum)
ANTICOAGULATION CONSULT NOTE - Follow Up Consult  Pharmacy Consult for Argatroban/Coumadin Indication: pulmonary embolus  Allergies  Allergen Reactions  . Heparin Other (See Comments)    Heparin induced thrombocytopenia (SRA +)    Patient Measurements: Height: 6' 0.83" (185 cm) (as recorded earlier 12/6) Weight: 188 lb 7.9 oz (85.5 kg) IBW/kg (Calculated) : 79.52   Vital Signs: Temp: 98.2 F (36.8 C) (12/20 0629) Temp src: Oral (12/20 0629) BP: 111/70 mmHg (12/20 0629) Pulse Rate: 92  (12/20 0629)  Labs:  Basename 11/10/12 0500 11/09/12 0405 11/08/12 0403  HGB -- -- 8.0*  HCT -- -- 24.6*  PLT -- -- 265  APTT 67* 70* 72*  LABPROT 27.0* 23.0* 24.3*  INR 2.65* 2.14* 2.30*  HEPARINUNFRC -- -- --  CREATININE 1.54* 1.61* 1.56*  CKTOTAL -- -- --  CKMB -- -- --  TROPONINI -- -- --    Estimated Creatinine Clearance: 52.3 ml/min (by C-G formula based on Cr of 1.54).  Medications:  Infusions:     . sodium chloride 20 mL (11/05/12 0915)  . 0.9 % NaCl with KCl 40 mEq / L 20 mL/hr (11/07/12 1143)  . argatroban 1 mcg/kg/min (11/10/12 0530)    Assessment: 66yo M recently hospitalized @ [facility] w/ R internal jugular clot 10/23/12 10/25/12 thought to be secondary to pheresis catheter. Pt was started on heparin infusion then transitioned to Lovenox 80mg  sq q12h. Pt presented to [facility] 12/6 with fever, cough and shortness of breath and admitted. Pt found to have RLL PE and was started on Heparin gtt. Pltc fell and concern for HIT so changed to argatroban infusion 10/29/12.  Ileus resolved   Coumadin dosing (12/16-12/19): 4 mg ,2 mg, 7.5 mg, 10mg   PTT this am within therapeutic range @ 5.1 ml/hr (6mcg/kg/min)  Argatroban has an additive effect on the INR so the goal INR while on Argatroban is >or= 4. Once INR is >/= 4 then argatroban can be stopped and will confirm that INR is 2-3 4 hrs after Argatroban stopped.   INR today is up, 2.65  Day 5/5 of minimum overlap with argatroban,  argatroban will need to be continued until INR > 4 x 24 hours.   No bleeding reported  Pltc recovered; SRA confirms + HIT   Pt reportedly on Coumadin in the past, and had issues with getting his INR therapeutic. Past Coumadin dose was 5 mg daily except 7.5 mg on MWF.   Goal of Therapy:  aPTT 50-90 seconds INR > 4 while on argatroban, INR 2-3 when off argatroban Monitor platelets by anticoagulation protocol: Yes   Plan:   Continue argatroban at current rate.   Coumadin 10 mg x 1 tonight   Daily aPTT. Daily INR  Gwen Her PharmD  [phone] 11/10/2012 7:16 AM

## 2012-11-10 NOTE — Progress Notes (Signed)
IP PROGRESS NOTE  Subjective:  No new complaint. The bowel movements are firm. He requests Senokot.  Objective: Vital signs in last 24 hours: Blood pressure 111/70, pulse 92, temperature 98.2 F (36.8 C), temperature source Oral, resp. rate 16, height 6' 0.84" (1.85 m), weight 188 lb 7.9 oz (85.5 kg), SpO2 100.00%.  Intake/Output from previous day: 12/19 0701 - 12/20 0700 In: 1393 [P.O.:770; I.V.:623] Out: 2 [Urine:2]  Physical Exam:  Lungs: Clear bilaterally, no respiratory distress Cardiac: Regular rate and rhythm Abdomen: Less Distended, soft, nontender Extremities: Trace pitting edema at the low leg bilaterally Left arm PICC without erythema  Lab Results:  [facility] 11/08/12 0403  WBC 6.6  HGB 8.0*  HCT 24.6*  PLT 265    BMET  Basename 11/10/12 0500 11/09/12 0405  NA 137 136  K 4.2 4.0  CL 107 107  CO2 24 23  GLUCOSE 95 106*  BUN 18 19  CREATININE 1.54* 1.61*  CALCIUM 8.2* 8.4   PTT - 60 PT/INR-2. 65   Studies/Results: No results found.  Medications: I have reviewed the patient's current medications.  Assessment/Plan:  1. Acute pulmonary embolism-presumably from a right jugular DVT, now on Argatroban 2. Right neck DVT secondary to apheresis catheter-hospitalized at [facility] from 10/23/2012 through 10/25/2012 and treated with heparin/Lovenox  3. Thrombocytopenia-resolved, likely related to HIT 4. Right lower extremity deep vein thrombosis 02/02/2012, Doppler examination of the legs 10/28/2012 revealed no acute DVT. 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 5. Plasma cell dyscrasia/amyloidosis-scheduled for high-dose chemotherapy and stem cell support at [facility], delayed secondary to the current venous thromboembolic disease. 6. Acute nausea/vomiting and abdominal pain on 10/28/2012. A CT 10/29/2012 confirmed an SMV/portal vein thrombosis with changes of bowel ischemia. Continue anticoagulation with Argatroban. The ileus has resolved. Now on  a low residual diet. 7. acute renal failure-the creatinine has stabilized, likely contrast/dehydration related nephropathy  8. Nutrition-TNA started 10/31/2012, weaned to off 11/06/2012 9. Heparin induced thrombocytopenia/thromboses-confirmed on a serotonin release assay. He will need long-term anticoagulation therapy , Coumadin started 11/06/2012-the INR remains subtherapeutic.  The plan is to continue Argatroban until the PT INR is therapeutic. The INR should be confirmed to be therapeutic when the Argatroban is discontinued. He will need to remain on Argatroban for at least a 1-2 day overlap once the INR is therapeutic.  Mr. Claxton can potentially be discharged when the INR is therapeutic and there has been at least a 24-hour overlap with Argatroban. Outpatient followup will be arranged to check the PT/INR.   LOS: 14 days   Loretha Ure  11/10/2012, 8:00 AM

## 2012-11-11 DIAGNOSIS — I82890 Acute embolism and thrombosis of other specified veins: Secondary | ICD-10-CM

## 2012-11-11 DIAGNOSIS — I82819 Embolism and thrombosis of superficial veins of unspecified lower extremities: Secondary | ICD-10-CM

## 2012-11-11 LAB — APTT: aPTT: 62 seconds — ABNORMAL HIGH (ref 24–37)

## 2012-11-11 LAB — PROTIME-INR
INR: 3.73 — ABNORMAL HIGH (ref 0.00–1.49)
Prothrombin Time: 34.7 seconds — ABNORMAL HIGH (ref 11.6–15.2)

## 2012-11-11 MED ORDER — WARFARIN SODIUM 4 MG PO TABS
8.0000 mg | ORAL_TABLET | Freq: Once | ORAL | Status: AC
  Administered 2012-11-11: 8 mg via ORAL
  Filled 2012-11-11: qty 2

## 2012-11-11 NOTE — Progress Notes (Signed)
ANTICOAGULATION CONSULT NOTE - Follow Up Consult  Pharmacy Consult for Argatroban/Coumadin Indication: pulmonary embolus  Allergies  Allergen Reactions  . Heparin Other (See Comments)    Heparin induced thrombocytopenia (SRA +)   Patient Measurements: Height: 6' 0.83" (185 cm) (as recorded earlier 12/6) Weight: 188 lb 7.9 oz (85.5 kg) IBW/kg (Calculated) : 79.52   Vital Signs: Temp: 98.5 F (36.9 C) (12/21 0733) Temp src: Oral (12/21 0733) BP: 104/63 mmHg (12/21 0733) Pulse Rate: 69  (12/21 0733)  Labs:  Basename 11/11/12 0500 11/10/12 0500 11/09/12 0405  HGB -- -- --  HCT -- -- --  PLT -- -- --  APTT 62* 67* 70*  LABPROT 34.7* 27.0* 23.0*  INR 3.73* 2.65* 2.14*  HEPARINUNFRC -- -- --  CREATININE -- 1.54* 1.61*  CKTOTAL -- -- --  CKMB -- -- --  TROPONINI -- -- --   Estimated Creatinine Clearance: 52.3 ml/min (by C-G formula based on Cr of 1.54).  Medications:  Infusions:     . sodium chloride 20 mL (11/05/12 0915)  . 0.9 % NaCl with KCl 40 mEq / L 20 mL/hr (11/07/12 1143)  . argatroban 1 mcg/kg/min (11/11/12 0321)   Assessment: 66yo M recently hospitalized @ [facility] w/ R internal jugular clot 10/23/12 10/25/12 thought to be secondary to pheresis catheter. Pt was started on heparin infusion then transitioned to Lovenox 80mg  sq q12h. Pt presented to [facility] 12/6 with fever, cough and shortness of breath and admitted. Pt found to have RLL PE and was started on Heparin gtt. Pltc fell and concern for HIT so changed to argatroban infusion 10/29/12.  Ileus resolved (TNA from 12/10-12/16,reg diet from 12/19 + Ensure avg once daily)  Coumadin dosing (12/16-12/20): 4 mg ,2 mg, 7.5 mg, 10mg ,10mg   PTT this am (62 sec) remains within therapeutic range @ 5.1 ml/hr (33mcg/kg/min)  Argatroban has an additive effect on the INR so the goal INR while on Argatroban is >or= 4. Once INR is >/= 4 then argatroban can be stopped and will confirm that INR is 2-3 4 hrs after Argatroban stopped.    INR today is increased at 3.73   Day 6/5 of minimum overlap with argatroban, argatroban will need to be continued until INR > 4 x 24 hours.   No bleeding reported  Pltc recovered; SRA confirms + HIT   Pt reportedly on Coumadin in the past, and had issues with getting his INR therapeutic. Past Coumadin dose was 5 mg daily except 7.5 mg on MWF.   Goal of Therapy:  aPTT 50-90 seconds INR > 4 while on argatroban, INR 2-3 when off argatroban Monitor platelets by anticoagulation protocol: Yes   Plan:   Continue argatroban at current rate.   Coumadin 8 mg x 1 tonight (desire INR > 4 without overshoot)  Daily aPTT. Daily INR  Otho Bellows PharmD  [phone] 11/11/2012 7:34 AM

## 2012-11-11 NOTE — Progress Notes (Signed)
Subjective: The patient is seen and examined today. He is doing fine with no specific complaints. He was sitting in the chair and solving puzzles. The patient denied having any bleeding issues. He denied having any significant fever or chills. He denied having any chest pain, shortness breath, cough or hemoptysis. He is rating his treatment with Coumadin and  Argatroban fairly well.  Objective: Vital signs in last 24 hours: Temp:  [97.7 F (36.5 C)-98.5 F (36.9 C)] 98.5 F (36.9 C) (12/21 0733) Pulse Rate:  [69-89] 69  (12/21 0733) Resp:  [18] 18  (12/21 0733) BP: (104-126)/(60-64) 104/63 mmHg (12/21 0733) SpO2:  [99 %-100 %] 100 % (12/21 0733)  Intake/Output from previous day: 12/20 0701 - 12/21 0700 In: 1185 [P.O.:600; I.V.:585] Out: -  Intake/Output this shift:    General appearance: alert, cooperative and no distress Resp: clear to auscultation bilaterally Cardio: regular rate and rhythm, S1, S2 normal, no murmur, click, rub or gallop GI: soft, non-tender; bowel sounds normal; no masses,  no organomegaly Extremities: extremities normal, atraumatic, no cyanosis or edema  Lab Results:  No results found for this basename: WBC:2,HGB:2,HCT:2,PLT:2 in the last 72 hours BMET  Basename 11/10/12 0500 11/09/12 0405  NA 137 136  K 4.2 4.0  CL 107 107  CO2 24 23  GLUCOSE 95 106*  BUN 18 19  CREATININE 1.54* 1.61*  CALCIUM 8.2* 8.4    Studies/Results: No results found.  Medications: I have reviewed the patient's current medications.  Assessment/Plan: The Patient is doing fine today with no specific complaints. We'll continue with the current care plan as prescribed below by Dr. Truett Perna. 1. Acute pulmonary embolism-presumably from a right jugular DVT, now on Argatroban  2. Right neck DVT secondary to apheresis catheter-hospitalized at [facility] from 10/23/2012 through 10/25/2012 and treated with heparin/Lovenox  3. Thrombocytopenia-resolved, likely related to HIT  4. Right lower  extremity deep vein thrombosis 02/02/2012, Doppler examination of the legs 10/28/2012 revealed no acute DVT. 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  5. Plasma cell dyscrasia/amyloidosis-scheduled for high-dose chemotherapy and stem cell support at [facility], delayed secondary to the current venous thromboembolic disease.  6. Acute nausea/vomiting and abdominal pain on 10/28/2012. A CT 10/29/2012 confirmed an SMV/portal vein thrombosis with changes of bowel ischemia. Continue anticoagulation with Argatroban. The ileus has resolved. Now on a low residual diet.  7. acute renal failure-the creatinine has stabilized, likely contrast/dehydration related nephropathy  8. Nutrition-TNA started 10/31/2012, weaned to off 11/06/2012  9. Heparin induced thrombocytopenia/thromboses-confirmed on a serotonin release assay. He will need long-term anticoagulation therapy , Coumadin started 11/06/2012-the INR remains subtherapeutic.  The plan is to continue Argatroban until the PT INR is therapeutic. The INR should be confirmed to be therapeutic when the Argatroban is discontinued. He will need to remain on Argatroban for at least a 1-2 day overlap once the INR is therapeutic.  Mr. Urieta can potentially be discharged when the INR is therapeutic and there has been at least a 24-hour overlap with Argatroban. Outpatient followup will be arranged to check the PT/INR.   LOS: 15 days    Niyah Mamaril K. 11/11/2012

## 2012-11-12 LAB — CBC
HCT: 24.2 % — ABNORMAL LOW (ref 39.0–52.0)
Hemoglobin: 8 g/dL — ABNORMAL LOW (ref 13.0–17.0)
MCH: 28.3 pg (ref 26.0–34.0)
MCHC: 33.1 g/dL (ref 30.0–36.0)
MCV: 85.5 fL (ref 78.0–100.0)
Platelets: 225 10*3/uL (ref 150–400)
RBC: 2.83 MIL/uL — ABNORMAL LOW (ref 4.22–5.81)
RDW: 15.7 % — ABNORMAL HIGH (ref 11.5–15.5)
WBC: 7.1 10*3/uL (ref 4.0–10.5)

## 2012-11-12 LAB — PROTIME-INR
INR: 4.96 — ABNORMAL HIGH (ref 0.00–1.49)
Prothrombin Time: 42.9 seconds — ABNORMAL HIGH (ref 11.6–15.2)

## 2012-11-12 LAB — APTT: aPTT: 77 seconds — ABNORMAL HIGH (ref 24–37)

## 2012-11-12 MED ORDER — WARFARIN SODIUM 6 MG PO TABS
6.0000 mg | ORAL_TABLET | Freq: Once | ORAL | Status: AC
  Administered 2012-11-12: 6 mg via ORAL
  Filled 2012-11-12: qty 1

## 2012-11-12 NOTE — Progress Notes (Signed)
Subjective:  Patient is doing fine with no specific complaints.The patient denied having any bleeding issues. He denied having any significant fever or chills. He denied having any chest pain, shortness breath, cough or hemoptysis. He is rating his treatment with Coumadin and  Argatroban fairly well.  Objective: Vital signs in last 24 hours: Temp:  [97.7 F (36.5 C)-98.3 F (36.8 C)] 97.7 F (36.5 C) (12/22 0540) Pulse Rate:  [85-95] 89  (12/22 0540) Resp:  [18-20] 20  (12/22 0540) BP: (106-113)/(60-69) 109/69 mmHg (12/22 0540) SpO2:  [100 %] 100 % (12/22 0540)  Intake/Output from previous day: 12/21 0701 - 12/22 0700 In: 922.8 [P.O.:360; I.V.:562.8] Out: -  Intake/Output this shift:    General appearance: alert, cooperative and no distress Resp: clear to auscultation bilaterally Cardio: regular rate and rhythm, S1, S2 normal, no murmur, click, rub or gallop GI: soft, non-tender; bowel sounds normal; no masses,  no organomegaly Extremities: extremities normal, atraumatic, no cyanosis or edema  Lab Results:   [facility] 11/12/12 0635  WBC 7.1  HGB 8.0*  HCT 24.2*  PLT 225   BMET  Basename 11/10/12 0500  NA 137  K 4.2  CL 107  CO2 24  GLUCOSE 95  BUN 18  CREATININE 1.54*  CALCIUM 8.2*    Studies/Results: No results found.  Medications: I have reviewed the patient's current medications.  Assessment/Plan:  1. Acute pulmonary embolism-presumably from a right jugular DVT, now on Argatroban  2. Right neck DVT secondary to apheresis catheter-hospitalized at [facility] from 10/23/2012 through 10/25/2012 and treated with heparin/Lovenox  3. Thrombocytopenia-resolved, likely related to HIT  4. Right lower extremity deep vein thrombosis 02/02/2012, Doppler examination of the legs 10/28/2012 revealed no acute DVT. 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  5. Plasma cell dyscrasia/amyloidosis-scheduled for high-dose chemotherapy and stem cell support at  [facility], delayed secondary to the current venous thromboembolic disease.  6. Acute nausea/vomiting and abdominal pain on 10/28/2012. A CT 10/29/2012 confirmed an SMV/portal vein thrombosis with changes of bowel ischemia. Continue anticoagulation with Argatroban. The ileus has resolved. Now on a low residual diet.  7. acute renal failure-the creatinine has stabilized, likely contrast/dehydration related nephropathy  8. Nutrition-TNA started 10/31/2012, weaned to off 11/06/2012  9. Heparin induced thrombocytopenia/thromboses-confirmed on a serotonin release assay. He will need long-term anticoagulation therapy , Coumadin started 11/06/2012-   INR is therapeutic today. We will need it to be Therapeutic (above 4) on 12/23 then we will D/C Argatroban.    LOS: 16 days    [facility] 11/12/2012

## 2012-11-12 NOTE — Progress Notes (Signed)
ANTICOAGULATION CONSULT NOTE - Follow Up Consult  Pharmacy Consult for Argatroban/Coumadin Indication: pulmonary embolus  Allergies  Allergen Reactions  . Heparin Other (See Comments)    Heparin induced thrombocytopenia (SRA +)   Patient Measurements: Height: 6' 0.83" (185 cm) (as recorded earlier 12/6) Weight: 188 lb 7.9 oz (85.5 kg) IBW/kg (Calculated) : 79.52   Vital Signs: Temp: 97.7 F (36.5 C) (12/22 0540) Temp src: Axillary (12/22 0540) BP: 109/69 mmHg (12/22 0540) Pulse Rate: 89  (12/22 0540)  Labs:  Basename 11/12/12 0635 11/11/12 0500 11/10/12 0500  HGB 8.0* -- --  HCT 24.2* -- --  PLT 225 -- --  APTT 77* 62* 67*  LABPROT 42.9* 34.7* 27.0*  INR 4.96* 3.73* 2.65*  HEPARINUNFRC -- -- --  CREATININE -- -- 1.54*  CKTOTAL -- -- --  CKMB -- -- --  TROPONINI -- -- --   Estimated Creatinine Clearance: 52.3 ml/min (by C-G formula based on Cr of 1.54).  Medications:  Infusions:     . sodium chloride 20 mL (11/05/12 0915)  . 0.9 % NaCl with KCl 40 mEq / L 20 mL/hr (11/07/12 1143)  . argatroban 1 mcg/kg/min (11/11/12 2323)   Assessment: 66yo M recently hospitalized @ [facility] w/ R internal jugular clot 10/23/12 10/25/12 thought to be secondary to pheresis catheter. Pt was started on heparin infusion then transitioned to Lovenox 80mg  sq q12h. Pt presented to [facility] 12/6 with fever, cough and shortness of breath and admitted. Pt found to have RLL PE and was started on Heparin gtt. Pltc fell and concern for HIT so changed to argatroban infusion 10/29/12.  Ileus resolved (TNA from 12/10-12/16,reg diet from 12/19 + Ensure avg once daily)  Coumadin dosing (12/16-12/21): 4 mg ,2 mg, 7.5 mg, 10mg ,10mg ,8mg   PTT this am (72 sec) remains within therapeutic range @ 5.1 ml/hr (40mcg/kg/min)  Argatroban has an additive effect on the INR so the goal INR while on Argatroban is >or= 4. Once INR is >/= 4 x 24 hr then argatroban can be stopped and will confirm that INR is 2-3 at 4 hrs after  Argatroban stopped.   INR today is above 4 @ 4.96   Day 7/5 of minimum overlap with argatroban, argatroban will need to be continued until INR > 4 x 24 hours.   No bleeding reported  Pltc recovered; SRA confirms + HIT   Pt reportedly on Coumadin in the past, and had issues with getting his INR therapeutic. Past Coumadin dose was 5 mg daily except 7.5 mg on MWF.   Goal of Therapy:  aPTT 50-90 seconds INR > 4 while on argatroban, INR 2-3 when off argatroban Monitor platelets by anticoagulation protocol: Yes   Plan:   Continue argatroban at current rate.   Coumadin 6 mg x 1 tonight (desire INR to remain > 4 tomorrow)  Daily aPTT. Daily INR  Otho Bellows PharmD  [phone] 11/12/2012 8:16 AM

## 2012-11-13 ENCOUNTER — Other Ambulatory Visit: Payer: Self-pay | Admitting: Pharmacist

## 2012-11-13 ENCOUNTER — Telehealth: Payer: Self-pay | Admitting: Oncology

## 2012-11-13 ENCOUNTER — Other Ambulatory Visit: Payer: Self-pay | Admitting: *Deleted

## 2012-11-13 DIAGNOSIS — C9000 Multiple myeloma not having achieved remission: Secondary | ICD-10-CM

## 2012-11-13 DIAGNOSIS — I2699 Other pulmonary embolism without acute cor pulmonale: Secondary | ICD-10-CM

## 2012-11-13 LAB — PROTIME-INR
INR: 5.85 (ref 0.00–1.49)
INR: 3.57 — ABNORMAL HIGH (ref 0.00–1.49)
Prothrombin Time: 48.4 seconds — ABNORMAL HIGH (ref 11.6–15.2)
Prothrombin Time: 33.6 seconds — ABNORMAL HIGH (ref 11.6–15.2)

## 2012-11-13 LAB — APTT: aPTT: 86 seconds — ABNORMAL HIGH (ref 24–37)

## 2012-11-13 MED ORDER — WARFARIN SODIUM 5 MG PO TABS: 5.0000 mg | ORAL_TABLET | Freq: Every day | ORAL | Status: DC

## 2012-11-13 MED ORDER — SENNA 8.6 MG PO TABS: 1.0000 | ORAL_TABLET | Freq: Every day | ORAL | Status: DC | PRN

## 2012-11-13 NOTE — Progress Notes (Addendum)
ANTICOAGULATION CONSULT NOTE - Follow Up Consult  Pharmacy Consult for Argatroban/Coumadin Indication: pulmonary embolus  Allergies  Allergen Reactions  . Heparin Other (See Comments)    Heparin induced thrombocytopenia (SRA +)   Patient Measurements: Height: 6' 0.83" (185 cm) (as recorded earlier 12/6) Weight: 188 lb 7.9 oz (85.5 kg) IBW/kg (Calculated) : 79.52   Vital Signs: Temp: 98 F (36.7 C) (12/23 0548) Temp src: Oral (12/23 0548) BP: 111/64 mmHg (12/23 0548) Pulse Rate: 86  (12/23 0548)  Labs:  Basename 11/13/12 0515 11/12/12 0635 11/11/12 0500  HGB -- 8.0* --  HCT -- 24.2* --  PLT -- 225 --  APTT 86* 77* 62*  LABPROT 48.4* 42.9* 34.7*  INR 5.85* 4.96* 3.73*  HEPARINUNFRC -- -- --  CREATININE -- -- --  CKTOTAL -- -- --  CKMB -- -- --  TROPONINI -- -- --   Estimated Creatinine Clearance: 52.3 ml/min (by C-G formula based on Cr of 1.54).  Medications:  Infusions:     . sodium chloride 20 mL (11/05/12 0915)  . 0.9 % NaCl with KCl 40 mEq / L 20 mL/hr (11/07/12 1143)  . argatroban 1 mcg/kg/min (11/13/12 0345)   Assessment: 66yo M recently hospitalized @ [facility] w/ R internal jugular clot 10/23/12 10/25/12 thought to be secondary to pheresis catheter. Pt was started on heparin infusion then transitioned to Lovenox 80mg  sq q12h. Pt presented to [facility] 12/6 with fever, cough and shortness of breath and admitted. Pt found to have RLL PE and was started on Heparin gtt. Pltc fell and concern for HIT so changed to argatroban infusion 10/29/12.  Ileus resolved (TNA from 12/10-12/16,reg diet from 12/19 + Ensure avg once daily)  Coumadin dosing (12/16-12/22): 4 mg ,2 mg, 7.5 mg, 10mg ,10mg ,8mg , 6mg   PTT this am (86 sec) remains within therapeutic range @ 5.1 ml/hr (44mcg/kg/min)  Argatroban has an additive effect on the INR so the goal INR while on Argatroban is >or= 4. Once INR is >/= 4 x 24 hr then argatroban can be stopped and will confirm that INR is 2-3 at 4 hrs after  Argatroban stopped.   INR today is above 4 @ 5.85  Day 8/5 of minimum overlap with argatroban, argatroban will need to be continued until INR > 4 x 24 hours.   No bleeding reported  Pltc recovered; SRA confirms + HIT   Pt on Coumadin in the past, and had issues with getting his INR therapeutic. Past Coumadin dose was 5 mg daily except 7.5 mg on MWF.   Goal of Therapy:  aPTT 50-90 seconds INR > 4 while on argatroban, INR 2-3 when off argatroban Monitor platelets by anticoagulation protocol: Yes   Plan:   INR > 4 x 24 hours, Notified nurse to stop argatroban infusion, recheck INR 4-6 hours. If within goal, can d/c argatroban  INR in 4 - 6 hours  [employer], Pharm.D. Clinical Oncology Pharmacist  Pager # [phone]  11/13/2012 7:25 AM    Afternoon Addedum: 1. INR came back 3.57 (above goal) off of argatroban 2. Discussed plan with oncologist, OK to discontinue argatroban, hold coumadin tonight, plan to start Coumadin 7.5 mg daily until next INR check. Will f/u INR with oncologist this week

## 2012-11-13 NOTE — Discharge Summary (Signed)
Physician Discharge Summary  Patient ID: Brent Allison @ATTENDINGNPI @ MRN: [id number] DOB/AGE: Apr 04, 1946 66 y.o.  Admit date: 10/27/2012 Discharge date: 11/13/2012  Discharge Diagnoses:  1. Acute pulmonary embolism 2. Acute mesenteric and portal vein thrombosis 3. HIT syndrome 4. Multiple myeloma/amyloidosis 5. Thrombocytopenia secondary to HIT 6. Anemia secondary to multiple myeloma and probably phlebotomy 7. Acute renal insufficiency   Discharged Condition: Improved  Discharge Labs:  PT/INR on 11/13/2012-3.57 (off of arbatroban)  Significant Diagnostic Studies: CT of the chest, CT of the abdomen/pelvis  Consults: Dr. Darrol Jump, Dr. Patel-nephrology  Procedures: Placement of left arm PICC  Disposition: 01-Home or Self Care     Medication List     As of 11/13/2012  1:53 PM    STOP taking these medications         enoxaparin 80 MG/0.8ML injection   Commonly known as: LOVENOX      moxifloxacin 400 MG tablet   Commonly known as: AVELOX      TAKE these medications         senna 8.6 MG Tabs   Commonly known as: SENOKOT   Take 1 tablet (8.6 mg total) by mouth daily as needed.      warfarin 5 MG tablet   Commonly known as: COUMADIN   Take 1 tablet (5 mg total) by mouth daily.            Follow-up Information    Follow up with [name], MD. (Dr. Truett Perna for a lab visit 11/14/2012, an office visit will be scheduled for within the next 2-3 weeks)    Contact information:   [address] [place] [address] [phone]          Hospital Course: Brent Allison is a 66 year old with a history of multiple myeloma/amyloidosis. He is currently undergoing planning for stem cell therapy. He developed a right jugular DVT following the stem cell pheresis procedure with a pheresis catheter in place. The catheter was removed and he was placed on heparin and then Lovenox anticoagulation. He was admitted on 10/27/2012 with shortness of breath, fever,  and cough. A chest CT confirmed a right pulmonary embolism and pulmonary consolidation/infarction. He was admitted and placed on heparin anticoagulation.  The platelet count has been low over the week prior to hospital admission. This was felt to be related to the a pheresis procedure. The platelets were improved on the day of hospital admission and fell slightly the following day while on heparin.  On 10/28/2012 he developed severe abdominal pain and nausea/vomiting. There was concern of a retroperitoneal bleed while on heparin. A CT revealed abnormal wall thickening within the abdominal and pelvic small bowel loops with extensive mesenteric edema.  The heparin was discontinued and he was placed on Argatroban. A CT with contrast on 10/29/2012 confirmed thrombus in the superior mesenteric vein and in the intrahepatic portal veins in the right hepatic lobe. Ascites and changes of ischemic enteritis were noted.  He was continued on anticoagulation therapy. He developed a severe ileus, but did not have signs of bowel infarction. The ileus persisted for the next week. He was seen in consultation by Dr. Derrell Lolling. He was placed on intravenous hydration and TNA. The ileus gradually resolved and his diet was advanced. He was tolerating a diet and having bowel movements at the time of discharge. The abdomen was less distended. The pain and nausea resolved.  A sample was submitted for HIT testing and returned positive, confirmed on a serotonin release assay. Coumadin was  added when Brent Allison was able to take by mouth. The Argatroban was continued until the PT/INR was therapeutic with an appropriate overlap. The Argatroban was discontinued on the morning of 11/13/2012. A repeat PT/INR returned at 3.57.  He appeared stable for discharge on 11/13/2012. The dyspnea, cough, abdominal pain, and ileus had resolved at the time of discharge. He will be continued on chronic Coumadin anticoagulation. I discussed the case with  the [facility] bone marrow transplant service throughout this hospital admission. Plans for stem cell therapy have been placed on hold pending his recovery from the SMV/portal thromboses. The platelet count recovered to the normal range prior to discharge.  He developed acute renal insufficiency. Dr. Allena Katz saw him in consultation and felt the renal insufficiency was most likely related to dehydration and contrast nephropathy. The creatinine stabilized between 1.5 and 1.6 prior to discharge.      SignedThornton Papas 11/13/2012, 1:53 PM

## 2012-11-13 NOTE — Progress Notes (Signed)
Pt ready for d/c. PICC line was removed by IV team, 30 minutes later observed site, WNL. Went over d/c instructions with pt, where to get medications. Instructed to continue taking coumadin to prevent future blood clots. Instructed to follow-up with lab draws tomorrow at Dr. Kalman Drape office. No change in pt condition since this AM assessment. Pt d/c'd to home with wife and daughter.

## 2012-11-13 NOTE — Telephone Encounter (Signed)
Pt aware of appt on January 2014 lab andMD

## 2012-11-13 NOTE — Progress Notes (Signed)
Pharmacist Elie Goody notified patient's INR is 5.85 per lab this am.

## 2012-11-13 NOTE — Progress Notes (Signed)
Left message on home machine with appointment time for lab on 12/24 and to follow up with scheduler tomorrow for the rest of his appointments.

## 2012-11-14 ENCOUNTER — Telehealth: Payer: Self-pay | Admitting: Oncology

## 2012-11-14 ENCOUNTER — Other Ambulatory Visit (HOSPITAL_BASED_OUTPATIENT_CLINIC_OR_DEPARTMENT_OTHER): Payer: Medicare Other | Admitting: Lab

## 2012-11-14 ENCOUNTER — Telehealth: Payer: Self-pay | Admitting: Pharmacist

## 2012-11-14 ENCOUNTER — Ambulatory Visit (HOSPITAL_BASED_OUTPATIENT_CLINIC_OR_DEPARTMENT_OTHER): Payer: Medicare Other | Admitting: Pharmacist

## 2012-11-14 DIAGNOSIS — I81 Portal vein thrombosis: Secondary | ICD-10-CM | POA: Insufficient documentation

## 2012-11-14 DIAGNOSIS — I2699 Other pulmonary embolism without acute cor pulmonale: Secondary | ICD-10-CM

## 2012-11-14 DIAGNOSIS — K55069 Acute infarction of intestine, part and extent unspecified: Secondary | ICD-10-CM

## 2012-11-14 DIAGNOSIS — I82409 Acute embolism and thrombosis of unspecified deep veins of unspecified lower extremity: Secondary | ICD-10-CM

## 2012-11-14 LAB — PROTIME-INR
INR: 2.7 (ref 2.00–3.50)
Protime: 32.4 Seconds — ABNORMAL HIGH (ref 10.6–13.4)

## 2012-11-14 NOTE — Telephone Encounter (Signed)
Received communication from Dr. Truett Perna that he would like to see repeat INR on [name] this Friday instead of next week. Called and left vm on patient's cell phone informing him of new appt date/time for 11/17/12.  Instructed him to call us back at [phone] if the scheduled times will pose a conflict.

## 2012-11-14 NOTE — Progress Notes (Signed)
INR therapeutic today (2.7) after holding one dose yesterday.  Pt seen in the Coumadin Clinic for the first time since being switched to Lovenox only back in March 2013 along with his wife.  Pt was discharged from [facility] yesterday after developing R jugular DVT, then acute PE, acute mesenteric and portal vein thrombosis, and his course complicated by a confirmed case of HIT.  He was switched from Lovenox and heparin products to argatroban, and re-initiated on Coumadin while in the hospital.  His dose of Coumadin while inpatient has varied from 4-10mg .  No changes to maintenance meds or diet recently.  Pt denies use of many OTC supplements and medications, but does use Advil or Aleve from time to time for arthritis pain.  He also had put himself on a baby aspirin daily for a couple years, but stopped this medication about 1 month prior.  Explained drug interaction between NSAIDs and Coumadin, and advised patient to switch to using Tylenol.  Pt communicated understanding.  He reports that he only had to use them only about 1-2x per month previously.  During visit, we also reviewed again the adverse effects of Coumadin, drug-drug and drug-food interactions, and importance of compliance.  Provided patient with handout outlining foods that contain low, moderate, and high amounts of vitamin K.  Will have pt resume Coumadin today at dose of 7.5mg  daily.  Will recheck INR in 1 week to assess response to new dose.

## 2012-11-14 NOTE — Telephone Encounter (Signed)
gv and printed appt schedule for pt for Jan 2014. °

## 2012-11-17 ENCOUNTER — Other Ambulatory Visit (HOSPITAL_BASED_OUTPATIENT_CLINIC_OR_DEPARTMENT_OTHER): Payer: Medicare Other | Admitting: Lab

## 2012-11-17 ENCOUNTER — Ambulatory Visit (HOSPITAL_BASED_OUTPATIENT_CLINIC_OR_DEPARTMENT_OTHER): Payer: Medicare Other | Admitting: Pharmacist

## 2012-11-17 DIAGNOSIS — K55069 Acute infarction of intestine, part and extent unspecified: Secondary | ICD-10-CM

## 2012-11-17 DIAGNOSIS — Z7901 Long term (current) use of anticoagulants: Secondary | ICD-10-CM

## 2012-11-17 DIAGNOSIS — I82409 Acute embolism and thrombosis of unspecified deep veins of unspecified lower extremity: Secondary | ICD-10-CM

## 2012-11-17 DIAGNOSIS — I2699 Other pulmonary embolism without acute cor pulmonale: Secondary | ICD-10-CM

## 2012-11-17 DIAGNOSIS — I81 Portal vein thrombosis: Secondary | ICD-10-CM

## 2012-11-17 LAB — PROTIME-INR
INR: 2.9 (ref 2.00–3.50)
Protime: 34.8 Seconds — ABNORMAL HIGH (ref 10.6–13.4)

## 2012-11-17 NOTE — Progress Notes (Signed)
INR therapeutic on 7.5mg  daily. No full missed doses.  No changes in meds or diet.  No complaints besides occasional blood on tissue when he blows his nose.  Will have pt continue current dose, and recheck INR in ~1 week on 11/23/12 after discussing monitoring plans with Dr. Truett Perna.

## 2012-11-20 ENCOUNTER — Other Ambulatory Visit (HOSPITAL_BASED_OUTPATIENT_CLINIC_OR_DEPARTMENT_OTHER): Payer: Medicare Other

## 2012-11-20 DIAGNOSIS — C9000 Multiple myeloma not having achieved remission: Secondary | ICD-10-CM

## 2012-11-20 LAB — PROTHROMBIN TIME
INR: 4.91 — ABNORMAL HIGH (ref <1.50)
Prothrombin Time: 42.6 seconds — ABNORMAL HIGH (ref 11.6–15.2)

## 2012-11-20 LAB — BASIC METABOLIC PANEL (CC13)
BUN: 22 mg/dL (ref 7.0–26.0)
CO2: 25 mEq/L (ref 22–29)
Calcium: 8.9 mg/dL (ref 8.4–10.4)
Chloride: 107 mEq/L (ref 98–107)
Creatinine: 1.6 mg/dL — ABNORMAL HIGH (ref 0.7–1.3)
Glucose: 89 mg/dl (ref 70–99)
Potassium: 4 mEq/L (ref 3.5–5.1)
Sodium: 141 mEq/L (ref 136–145)

## 2012-11-21 ENCOUNTER — Ambulatory Visit (HOSPITAL_BASED_OUTPATIENT_CLINIC_OR_DEPARTMENT_OTHER): Payer: Medicare Other | Admitting: Pharmacist

## 2012-11-21 DIAGNOSIS — I81 Portal vein thrombosis: Secondary | ICD-10-CM

## 2012-11-21 DIAGNOSIS — K55069 Acute infarction of intestine, part and extent unspecified: Secondary | ICD-10-CM

## 2012-11-21 DIAGNOSIS — I82409 Acute embolism and thrombosis of unspecified deep veins of unspecified lower extremity: Secondary | ICD-10-CM

## 2012-11-21 DIAGNOSIS — I2699 Other pulmonary embolism without acute cor pulmonale: Secondary | ICD-10-CM

## 2012-11-21 NOTE — Progress Notes (Signed)
Called to discuss 11/20/12 INR results Pt's INR was supratherapeutic at 4.91 on 7.5mg  daily.  Since patient was not due to be seen by the Coumadin Clinic that day, these results were not reviewed until today.  On calling the patient, he told me that he intentionally decreased his Coumadin last night to 5mg  after learning his high INR results.  He has not had problems with bleeding or bruising.  No changes in meds or diet that he can recall.  Will have pt hold tonight and tomorrow's dose of Coumadin.  Will recheck INR as scheduled on 11/23/12.

## 2012-11-23 ENCOUNTER — Other Ambulatory Visit (HOSPITAL_BASED_OUTPATIENT_CLINIC_OR_DEPARTMENT_OTHER): Payer: Medicare Other | Admitting: Lab

## 2012-11-23 ENCOUNTER — Ambulatory Visit (HOSPITAL_BASED_OUTPATIENT_CLINIC_OR_DEPARTMENT_OTHER): Payer: Medicare Other | Admitting: Pharmacist

## 2012-11-23 DIAGNOSIS — I82409 Acute embolism and thrombosis of unspecified deep veins of unspecified lower extremity: Secondary | ICD-10-CM

## 2012-11-23 DIAGNOSIS — I2699 Other pulmonary embolism without acute cor pulmonale: Secondary | ICD-10-CM

## 2012-11-23 DIAGNOSIS — I81 Portal vein thrombosis: Secondary | ICD-10-CM

## 2012-11-23 DIAGNOSIS — K55069 Acute infarction of intestine, part and extent unspecified: Secondary | ICD-10-CM

## 2012-11-23 LAB — POCT INR: INR: 2

## 2012-11-23 LAB — PROTIME-INR
INR: 2 (ref 2.00–3.50)
Protime: 24 Seconds — ABNORMAL HIGH (ref 10.6–13.4)

## 2012-11-23 NOTE — Progress Notes (Signed)
INR = 2 after holding 2 doses for supratherapeutic INR on Tuesday of this week. No bleeding.  Pt has bruising of fingertips but says this is chronic. Likely will be going for transplant in February. INR at goal now.  I will have him restart Coumadin at dose of 5 mg/day. Pt seeing MD on Tues 11/28/12 so we can recheck his INR then. Marily Lente, Pharm.D.

## 2012-11-28 ENCOUNTER — Ambulatory Visit (HOSPITAL_BASED_OUTPATIENT_CLINIC_OR_DEPARTMENT_OTHER): Payer: Medicare Other | Admitting: Oncology

## 2012-11-28 ENCOUNTER — Other Ambulatory Visit (HOSPITAL_BASED_OUTPATIENT_CLINIC_OR_DEPARTMENT_OTHER): Payer: Medicare Other | Admitting: Lab

## 2012-11-28 ENCOUNTER — Ambulatory Visit: Payer: Medicare Other | Admitting: Pharmacist

## 2012-11-28 ENCOUNTER — Other Ambulatory Visit: Payer: Self-pay | Admitting: Dermatology

## 2012-11-28 ENCOUNTER — Telehealth: Payer: Self-pay | Admitting: Oncology

## 2012-11-28 VITALS — BP 123/70 | HR 91 | Temp 97.1°F | Resp 18 | Ht 72.0 in | Wt 190.8 lb

## 2012-11-28 DIAGNOSIS — I81 Portal vein thrombosis: Secondary | ICD-10-CM

## 2012-11-28 DIAGNOSIS — C9000 Multiple myeloma not having achieved remission: Secondary | ICD-10-CM

## 2012-11-28 DIAGNOSIS — Z86718 Personal history of other venous thrombosis and embolism: Secondary | ICD-10-CM

## 2012-11-28 DIAGNOSIS — K55069 Acute infarction of intestine, part and extent unspecified: Secondary | ICD-10-CM

## 2012-11-28 DIAGNOSIS — I82409 Acute embolism and thrombosis of unspecified deep veins of unspecified lower extremity: Secondary | ICD-10-CM

## 2012-11-28 DIAGNOSIS — D7582 Heparin induced thrombocytopenia (HIT): Secondary | ICD-10-CM

## 2012-11-28 DIAGNOSIS — I2699 Other pulmonary embolism without acute cor pulmonale: Secondary | ICD-10-CM

## 2012-11-28 DIAGNOSIS — Z86711 Personal history of pulmonary embolism: Secondary | ICD-10-CM

## 2012-11-28 LAB — PROTIME-INR
INR: 1.9 — ABNORMAL LOW (ref 2.00–3.50)
Protime: 22.8 Seconds — ABNORMAL HIGH (ref 10.6–13.4)

## 2012-11-28 NOTE — Progress Notes (Signed)
[facility]    OFFICE PROGRESS NOTE   INTERVAL HISTORY:   He returns as scheduled. He was discharged from the hospital on 11/13/2012 after an admission with acute pulmonary embolism and mesenteric/portal vein thrombosis. He was diagnosed with HIT syndrome while in the hospital. He now takes Coumadin.  Brent Allison denies abdominal pain and nausea. Constipation is relieved with Senokot. Leg edema has improved. No apparent ascites. He is scheduled for an appointment at [facility] next week.  Objective:  Vital signs in last 24 hours:  Blood pressure 123/70, pulse 91, temperature 97.1 F (36.2 C), temperature source Oral, resp. rate 18, height 6' (1.829 m), weight 190 lb 12.8 oz (86.546 kg).    HEENT: Several ecchymoses at the buccal mucosa Resp: Lungs clear bilaterally Cardio: Regular rate and rhythm GI: Nontender, no hepatosplenomegaly, no apparent ascites Vascular: Trace low pretibial/ankle edema bilaterally Neuro: The motor strength appears intact in the arms and hand      Lab Results:  Lab Results  Component Value Date   WBC 7.1 11/12/2012   HGB 8.0* 11/12/2012   HCT 24.2* 11/12/2012   MCV 85.5 11/12/2012   PLT 225 11/12/2012   PT/INR on 11/29/2011-1.9  11/20/2012-BUN 22, creatinine 1.6 Medications: I have reviewed the patient's current medications.  Assessment/Plan: 1. 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 2. "Bruising" at the eyelids and mouth: Likely related to amyloidosis, persistent. 3. Numbness and loss of vibratory sense at the fingertips: This predated Velcade-based therapy, but worsened. Now much improved following cervical spine surgery. 4. Elevated serum free lambda light chains. The lambda light chains were lower on November 16 and slightly higher on 12/14/2011. 5. Lambda light chain proteinuria. 6. Bone marrow plasmacytosis: Variable increase in plasma cells noted on the bone marrow biopsy 07/29/2011 with plasma cells estimated to  represent between 4% and 20% of the cellular population. 7. Remote history of prostate cancer. 8. Sleep apnea. 9. Dyslipidemia. 10. Report of pneumonia on 2 occasions in 2011. 11. Admission to a hospital in [place], [place] October 2012 with "pneumonia." A chest x-ray at [facility] on November 2 was negative .  12. Low serum immunoglobulin G level. 13. Borderline-low hemoglobin level, improved. 14. Plasma cell dyscrasia with associated amyloidosis.  a. Initiation of systemic therapy with Cytoxan, Velcade, and Decadron 08/12/2011. Cycle #2 was initiated on 09/16/2011. Cycle #3 was initiated on 10/15/2011. Velcade was placed on hold due to neuropathy. b. The serum free lambda light chains were decreased on October 08 2011. c. The serum free lambda light chains were slightly increased on 12/14/2011, lower on 12/29/2011 and 02/02/2012. d. Initiation of Revlimid/Decadron February 2013, cycle 2 started on 01/28/2012. 15. Loss of "balance "and proximal motor weakness secondary to cervical stenosis-status post decompression surgery on 02/19/2012. Improved, but not resolved stenosis with mass effect on the spinal cord noted on a repeat MRI 03/28/2012. He underwent further decompression surgery on 04/06/2012. The ataxia, weakness, and peripheral numbness is much improved.  16. Right lower Extremity deep vein thrombosis 02/02/2012-a Doppler ultrasound confirmed a gastrocnemius and peroneal deep vein thrombosis. An IVC filter was placed prior to surgery and he was maintained on Lovenox. The IVC filter was removed on 05/04/2012. Now maintained off of anticoagulation  17. Anorexia following cervical spine surgery. Improved.  18. Episode of flank pain and hematuria in 03/31/2012-etiology unclear. He completed a course of antibiotics. The hematuria and pain have resolved.  19. Rectal wall thickening noted on a CT of the pelvis 03/31/2012.  20.  Recent colonoscopy showed an area of abnormality at the rectum  with biopsy positive for amyloid.  21. Lung nodule noted on the CT 03/31/2012 22. Lumbar stenosis-status post surgery on 07/31/2012.  23. Status post recent stem cell collection.  24.Rright internal jugular thrombosis, 10/22/2012, likely related to pheresis catheter 25. Admission with acute right pulmonary embolism 10/27/2012 26. Superior mesenteric vein/portal vein thrombosis diagnosed on a CT of the abdomen 10/28/2012 and 10/29/2012-treated with Argatroban 27. HIT confirmed on a serotonin release assay 10/29/2012-now maintained on Coumadin 28. Renal insufficiency during the December 2013 hospital admission-likely related to dehydration and? Contrast nephropathy-partially improved 29. Anemia secondary to multiple myeloma and polypphlebotomy during the December 2013 hospital admission-we will check a CBC when he returns for a PT check next week.    Disposition:  He appears to have recovered from the pulmonary embolism and SMV/portal vein thrombosis. He was diagnosed with HIT and is now maintained on Coumadin. The Coumadin dose was adjusted today and he is being followed in the Coumadin clinic.  [name] will be seen at [facility] next week. He plans to undergo high-dose therapy with autologous stem cell support within the next few months.  He is scheduled to return for an office visit here in 2 months. He will contact us in the interim for new symptoms.   [name], MD  11/28/2012  5:32 PM

## 2012-11-28 NOTE — Telephone Encounter (Signed)
gv pt appt schedule for January and March.  °

## 2012-11-28 NOTE — Progress Notes (Signed)
INR slightly subtherapeutic today (1.9) on 5mg  daily. No changes.  No complaints.  Bruising is noted by pt, but he states that this is normal for him. Will have pt increase dose of Coumadin slightly to 5mg  daily except 7.5mg  on TuF.  Recheck INR in 6 days to assess response.

## 2012-11-30 ENCOUNTER — Telehealth: Payer: Self-pay | Admitting: *Deleted

## 2012-11-30 NOTE — Telephone Encounter (Signed)
Call from pt asking if Dr. Truett Perna thinks it is OK for him to have a flu shot now. YES, per Dr. Truett Perna. Pt will let us know if he needs to have this done here.

## 2012-12-04 ENCOUNTER — Other Ambulatory Visit: Payer: Medicare Other

## 2012-12-04 ENCOUNTER — Other Ambulatory Visit (HOSPITAL_BASED_OUTPATIENT_CLINIC_OR_DEPARTMENT_OTHER): Payer: Medicare Other | Admitting: Lab

## 2012-12-04 ENCOUNTER — Ambulatory Visit (HOSPITAL_BASED_OUTPATIENT_CLINIC_OR_DEPARTMENT_OTHER): Payer: Medicare Other | Admitting: Pharmacist

## 2012-12-04 DIAGNOSIS — I82409 Acute embolism and thrombosis of unspecified deep veins of unspecified lower extremity: Secondary | ICD-10-CM

## 2012-12-04 DIAGNOSIS — I2699 Other pulmonary embolism without acute cor pulmonale: Secondary | ICD-10-CM

## 2012-12-04 DIAGNOSIS — C9000 Multiple myeloma not having achieved remission: Secondary | ICD-10-CM

## 2012-12-04 DIAGNOSIS — I81 Portal vein thrombosis: Secondary | ICD-10-CM

## 2012-12-04 DIAGNOSIS — K55069 Acute infarction of intestine, part and extent unspecified: Secondary | ICD-10-CM

## 2012-12-04 LAB — CBC WITH DIFFERENTIAL/PLATELET
BASO%: 0.8 % (ref 0.0–2.0)
Basophils Absolute: 0 10*3/uL (ref 0.0–0.1)
EOS%: 3.3 % (ref 0.0–7.0)
Eosinophils Absolute: 0.2 10*3/uL (ref 0.0–0.5)
HCT: 27.5 % — ABNORMAL LOW (ref 38.4–49.9)
HGB: 9.3 g/dL — ABNORMAL LOW (ref 13.0–17.1)
LYMPH%: 12 % — ABNORMAL LOW (ref 14.0–49.0)
MCH: 29.6 pg (ref 27.2–33.4)
MCHC: 34 g/dL (ref 32.0–36.0)
MCV: 87.1 fL (ref 79.3–98.0)
MONO#: 0.7 10*3/uL (ref 0.1–0.9)
MONO%: 12.8 % (ref 0.0–14.0)
NEUT#: 3.9 10*3/uL (ref 1.5–6.5)
NEUT%: 71.1 % (ref 39.0–75.0)
Platelets: 155 10*3/uL (ref 140–400)
RBC: 3.15 10*6/uL — ABNORMAL LOW (ref 4.20–5.82)
RDW: 17.5 % — ABNORMAL HIGH (ref 11.0–14.6)
WBC: 5.6 10*3/uL (ref 4.0–10.3)
lymph#: 0.7 10*3/uL — ABNORMAL LOW (ref 0.9–3.3)

## 2012-12-04 LAB — POCT INR: INR: 2.1

## 2012-12-04 LAB — BASIC METABOLIC PANEL (CC13)
BUN: 24 mg/dL (ref 7.0–26.0)
CO2: 23 mEq/L (ref 22–29)
Calcium: 9.4 mg/dL (ref 8.4–10.4)
Chloride: 108 mEq/L — ABNORMAL HIGH (ref 98–107)
Creatinine: 1.5 mg/dL — ABNORMAL HIGH (ref 0.7–1.3)
Glucose: 121 mg/dl — ABNORMAL HIGH (ref 70–99)
Potassium: 3.9 mEq/L (ref 3.5–5.1)
Sodium: 139 mEq/L (ref 136–145)

## 2012-12-04 LAB — PROTIME-INR
INR: 2.1 (ref 2.00–3.50)
Protime: 25.2 Seconds — ABNORMAL HIGH (ref 10.6–13.4)

## 2012-12-04 NOTE — Progress Notes (Signed)
Continue 5mg  daily except 7.5mg  on TuF.   Recheck INR in ~ 10 days on 12/12/12 with lab at 0830 and [facility] at 0845. Pt would like a flu shot. Spoke with Darl Pikes, she will arrange this injection to be given with his next lab/coumadin clinic appointment on 12/12/12.

## 2012-12-04 NOTE — Patient Instructions (Signed)
Continue 5mg  daily except 7.5mg  on TuF.   Recheck INR in ~ 10 days on 12/12/12 with lab at 0830 and [facility] at 0845.

## 2012-12-05 ENCOUNTER — Telehealth: Payer: Self-pay | Admitting: *Deleted

## 2012-12-05 NOTE — Telephone Encounter (Signed)
POF to schedulers for flu shot next anti-coag visit.

## 2012-12-12 ENCOUNTER — Other Ambulatory Visit (HOSPITAL_BASED_OUTPATIENT_CLINIC_OR_DEPARTMENT_OTHER): Payer: Medicare Other | Admitting: Lab

## 2012-12-12 ENCOUNTER — Ambulatory Visit: Payer: Medicare Other

## 2012-12-12 ENCOUNTER — Ambulatory Visit (HOSPITAL_COMMUNITY)
Admission: RE | Admit: 2012-12-12 | Discharge: 2012-12-12 | Disposition: A | Discharge status: Home / Self Care | Payer: Medicare Other | Source: Ambulatory Visit | Attending: Oncology | Admitting: Oncology

## 2012-12-12 ENCOUNTER — Telehealth: Payer: Self-pay | Admitting: *Deleted

## 2012-12-12 ENCOUNTER — Ambulatory Visit (HOSPITAL_BASED_OUTPATIENT_CLINIC_OR_DEPARTMENT_OTHER): Payer: Medicare Other | Admitting: Pharmacist

## 2012-12-12 DIAGNOSIS — R059 Cough, unspecified: Secondary | ICD-10-CM | POA: Insufficient documentation

## 2012-12-12 DIAGNOSIS — I2699 Other pulmonary embolism without acute cor pulmonale: Secondary | ICD-10-CM

## 2012-12-12 DIAGNOSIS — J4489 Other specified chronic obstructive pulmonary disease: Secondary | ICD-10-CM | POA: Insufficient documentation

## 2012-12-12 DIAGNOSIS — R05 Cough: Secondary | ICD-10-CM | POA: Insufficient documentation

## 2012-12-12 DIAGNOSIS — E8589 Other amyloidosis: Secondary | ICD-10-CM

## 2012-12-12 DIAGNOSIS — R0600 Dyspnea, unspecified: Secondary | ICD-10-CM

## 2012-12-12 DIAGNOSIS — K55069 Acute infarction of intestine, part and extent unspecified: Secondary | ICD-10-CM

## 2012-12-12 DIAGNOSIS — J90 Pleural effusion, not elsewhere classified: Secondary | ICD-10-CM | POA: Insufficient documentation

## 2012-12-12 DIAGNOSIS — R0602 Shortness of breath: Secondary | ICD-10-CM | POA: Insufficient documentation

## 2012-12-12 DIAGNOSIS — J449 Chronic obstructive pulmonary disease, unspecified: Secondary | ICD-10-CM | POA: Insufficient documentation

## 2012-12-12 DIAGNOSIS — J9819 Other pulmonary collapse: Secondary | ICD-10-CM | POA: Insufficient documentation

## 2012-12-12 DIAGNOSIS — I82409 Acute embolism and thrombosis of unspecified deep veins of unspecified lower extremity: Secondary | ICD-10-CM

## 2012-12-12 DIAGNOSIS — I81 Portal vein thrombosis: Secondary | ICD-10-CM

## 2012-12-12 LAB — POCT INR: INR: 2.3

## 2012-12-12 LAB — PROTIME-INR
INR: 2.3 (ref 2.00–3.50)
Protime: 27.6 Seconds — ABNORMAL HIGH (ref 10.6–13.4)

## 2012-12-12 MED ORDER — INFLUENZA VIRUS VACC SPLIT PF IM SUSP
0.5000 mL | Freq: Once | INTRAMUSCULAR | Status: DC
  Administered 2012-12-12: 0.5 mL via INTRAMUSCULAR
  Filled 2012-12-12: qty 0.5

## 2012-12-12 NOTE — Telephone Encounter (Signed)
Xray report reviewed by Dr. Truett Perna. Shows mild fluid overload. Called pt with instructions to follow up with Dr. Clarene Duke. Last office note and Xray report faxed to Dr. Clarene Duke. Dr. Truett Perna to call Dr. Clarene Duke to discuss case.

## 2012-12-12 NOTE — Telephone Encounter (Signed)
Pt in the office for flu shot. Reports dyspnea. Occasional cough. Vital signs stable, reviewed with Dr. Truett Perna. Chest Xray ordered. Pt will come to office for results.

## 2012-12-12 NOTE — Progress Notes (Signed)
INR = 2.3 on 5 mg/day; 7.5 mg Tu/Fri. Pt c/o DOE (walking down driveway & climbing stairs).  No CP/wheezing.  Pt reports having a cough, although he was not coughing in clinic today. Pt reports he has no fever.  He requests his flu vaccine today. Last Hgb = 9.3 g/dL (last week). Pt thinks he may have pneumonia. INR at goal.  No change to Coumadin dose. Repeat INR in 3 weeks.   Pt has our phone #'s to pharmacy in case he starts Abx.  He will call us if any meds change. Marily Lente, Pharm.D.

## 2013-01-02 ENCOUNTER — Other Ambulatory Visit (HOSPITAL_BASED_OUTPATIENT_CLINIC_OR_DEPARTMENT_OTHER): Payer: Medicare Other | Admitting: Lab

## 2013-01-02 ENCOUNTER — Ambulatory Visit (HOSPITAL_BASED_OUTPATIENT_CLINIC_OR_DEPARTMENT_OTHER): Payer: Medicare Other | Admitting: Pharmacist

## 2013-01-02 DIAGNOSIS — I2699 Other pulmonary embolism without acute cor pulmonale: Secondary | ICD-10-CM

## 2013-01-02 DIAGNOSIS — K55069 Acute infarction of intestine, part and extent unspecified: Secondary | ICD-10-CM

## 2013-01-02 DIAGNOSIS — I81 Portal vein thrombosis: Secondary | ICD-10-CM

## 2013-01-02 DIAGNOSIS — I82409 Acute embolism and thrombosis of unspecified deep veins of unspecified lower extremity: Secondary | ICD-10-CM

## 2013-01-02 LAB — PROTIME-INR
INR: 1.8 — ABNORMAL LOW (ref 2.00–3.50)
Protime: 21.6 Seconds — ABNORMAL HIGH (ref 10.6–13.4)

## 2013-01-02 LAB — POCT INR: INR: 1.8

## 2013-01-02 MED ORDER — WARFARIN SODIUM 5 MG PO TABS: ORAL_TABLET | ORAL | Status: DC

## 2013-01-02 NOTE — Patient Instructions (Addendum)
Continue 5mg  daily except 7.5mg  on Tu We Fr and Sun.   Recheck INR on 01/23/13 with lab at 11:15 and [facility] at 11:30 and Dr. Truett Perna at 12:00.

## 2013-01-02 NOTE — Progress Notes (Signed)
Pt has no changes to report other than incr his lasix to 80 mg daily.  He stated he is drinking less alcohol. Will slightly incr his dose to 5mg  daily except 7.5mg  on Tu We Fr and Sun.   Recheck INR on 01/23/13 with lab at 11:15 and [facility] at 11:30 and Dr. Truett Perna at 12:00.

## 2013-01-23 ENCOUNTER — Ambulatory Visit: Payer: Medicare Other | Admitting: Pharmacist

## 2013-01-23 ENCOUNTER — Ambulatory Visit (HOSPITAL_BASED_OUTPATIENT_CLINIC_OR_DEPARTMENT_OTHER): Payer: Medicare Other | Admitting: Oncology

## 2013-01-23 ENCOUNTER — Other Ambulatory Visit (HOSPITAL_BASED_OUTPATIENT_CLINIC_OR_DEPARTMENT_OTHER): Payer: Medicare Other | Admitting: Lab

## 2013-01-23 ENCOUNTER — Telehealth: Payer: Self-pay | Admitting: Oncology

## 2013-01-23 VITALS — BP 113/66 | HR 86 | Temp 98.6°F | Resp 20 | Ht 72.0 in | Wt 189.2 lb

## 2013-01-23 DIAGNOSIS — I81 Portal vein thrombosis: Secondary | ICD-10-CM

## 2013-01-23 DIAGNOSIS — I2699 Other pulmonary embolism without acute cor pulmonale: Secondary | ICD-10-CM

## 2013-01-23 DIAGNOSIS — E8581 Light chain (AL) amyloidosis: Secondary | ICD-10-CM

## 2013-01-23 DIAGNOSIS — K55069 Acute infarction of intestine, part and extent unspecified: Secondary | ICD-10-CM

## 2013-01-23 DIAGNOSIS — R0602 Shortness of breath: Secondary | ICD-10-CM

## 2013-01-23 DIAGNOSIS — D649 Anemia, unspecified: Secondary | ICD-10-CM

## 2013-01-23 DIAGNOSIS — C9000 Multiple myeloma not having achieved remission: Secondary | ICD-10-CM

## 2013-01-23 DIAGNOSIS — I82409 Acute embolism and thrombosis of unspecified deep veins of unspecified lower extremity: Secondary | ICD-10-CM

## 2013-01-23 LAB — PROTIME-INR
INR: 2.1 (ref 2.00–3.50)
Protime: 25.2 Seconds — ABNORMAL HIGH (ref 10.6–13.4)

## 2013-01-23 LAB — POCT INR: INR: 2.1

## 2013-01-23 NOTE — Progress Notes (Signed)
[facility]    OFFICE PROGRESS NOTE   INTERVAL HISTORY:   He returns as scheduled. No abdominal pain or difficulty with bowel function. He continues to have bruising around the eyes and mouth. Mr. Bushway complains of exertional dyspnea. No cough or dyspnea at rest. No fever. Good arm and leg strength.  He continues Coumadin anticoagulation. He is scheduled to be seen at [facility] later this week. He was evaluated by the coagulation service with regard to the HIT diagnosis.  Objective:  Vital signs in last 24 hours:  Blood pressure 113/66, pulse 86, temperature 98.6 F (37 C), temperature source Oral, resp. rate 20, height 6' (1.829 m), weight 189 lb 3.2 oz (85.821 kg).    HEENT: No thrush or ulcers, few small ecchymoses at the inner lip/buccal mucosa Resp: Lungs clear bilaterally Cardio: Regular rate and rhythm GI: No hepatosplenomegaly, nontender Vascular: No leg edema, right lower leg is slightly larger than the left side Neuro: The motor exam appears intact in the upper and lower extremities  Skin: Few ecchymoses around the eyes    Lab Results:  PT/INR 2.1  Medications: I have reviewed the patient's current medications.  Assessment/Plan: 1. 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 2. "Bruising" at the eyelids and mouth: Likely related to amyloidosis, persistent. 3. Numbness and loss of vibratory sense at the fingertips: This predated Velcade-based therapy, but worsened. Now much improved following cervical spine surgery. 4. Elevated serum free lambda light chains. The lambda light chains were lower on November 16 and slightly higher on 12/14/2011. 5. Lambda light chain proteinuria. 6. Bone marrow plasmacytosis: Variable increase in plasma cells noted on the bone marrow biopsy 07/29/2011 with plasma cells estimated to represent between 4% and 20% of the cellular population. 7. Remote history of prostate cancer. 8. Sleep  apnea. 9. Dyslipidemia. 10. Report of pneumonia on 2 occasions in 2011. 11. Admission to a hospital in [place], [place] October 2012 with "pneumonia." A chest x-ray at [facility] [place] on November 2 was negative .  12. Low serum immunoglobulin G level. 13. Plasma cell dyscrasia with associated amyloidosis.  a. Initiation of systemic therapy with Cytoxan, Velcade, and Decadron 08/12/2011. Cycle #2 was initiated on 09/16/2011. Cycle #3 was initiated on 10/15/2011. Velcade was placed on hold due to neuropathy. b. The serum free lambda light chains were decreased on October 08 2011. c. The serum free lambda light chains were slightly increased on 12/14/2011, lower on 12/29/2011 and 02/02/2012. d. Initiation of Revlimid/Decadron February 2013, cycle 2 started on 01/28/2012. 15. Loss of "balance "and proximal motor weakness secondary to cervical stenosis-status post decompression surgery on 02/19/2012. Improved, but not resolved stenosis with mass effect on the spinal cord noted on a repeat MRI 03/28/2012. He underwent further decompression surgery on 04/06/2012. The ataxia, weakness, and peripheral numbness is much improved.  16. Right lower Extremity deep vein thrombosis 02/02/2012-a Doppler ultrasound confirmed a gastrocnemius and peroneal deep vein thrombosis. An IVC filter was placed prior to surgery and he was maintained on Lovenox. The IVC filter was removed on 05/04/2012.  17. Anorexia following cervical spine surgery. Improved.  18. Episode of flank pain and hematuria in 03/31/2012-etiology unclear. He completed a course of antibiotics. The hematuria and pain have resolved.  19. Rectal wall thickening noted on a CT of the pelvis 03/31/2012.  20. Recent colonoscopy showed an area of abnormality at the rectum with biopsy positive for amyloid.  21. Lung nodule noted on the CT 03/31/2012  22. Lumbar stenosis-status  post surgery on 07/31/2012.  23. Status post stem cell collection.  24.Rright  internal jugular thrombosis, 10/22/2012, likely related to pheresis catheter and HIT 25. Admission with acute right pulmonary embolism 10/27/2012  26. Superior mesenteric vein/portal vein thrombosis diagnosed on a CT of the abdomen 10/28/2012 and 10/29/2012-treated with Argatroban  27. HIT confirmed on a serotonin release assay 10/29/2012-now maintained on Coumadin  28. Renal insufficiency during the December 2013 hospital admission-likely related to dehydration and? Contrast nephropathy-partially improved  29. Anemia secondary to multiple myeloma and polypphlebotomy during the December 2013 hospital admission-improved on 12/04/2012  30.exertional dyspnea-? Related to deconditioning, the recent pulmonary embolism, or anemia   Disposition:  His overall status is stable. He is scheduled for a pulmonary [facility] later this week to begin planning the stem cell procedure. He has been evaluated by Dr. Marcheta Grammes and a plan is in place for managing anti-coagulation therapy surrounding the stem cell procedure.  He will continue Coumadin anticoagulation. The Coumadin [facility] Cou[facility]in [facility]. Mr. Berwick will return for an office visit 04/06/2013. We will see him sooner as needed.   [name], MD  01/23/2013  5:06 PM

## 2013-01-23 NOTE — Telephone Encounter (Signed)
gv and printed appt schedule to pt for May.Marland Kitchen

## 2013-01-23 NOTE — Progress Notes (Signed)
INR = 2.1 on Coumadin 7.5 mg/day; 5 mg MWF. Typical bruising.   Preparing for SCT next month at [facility]. INR therapeutic.  No change to Coumadin dose. Return in 1 month & then will follow along post-SCT. Marily Lente, Pharm.D.

## 2013-01-29 ENCOUNTER — Encounter: Payer: Self-pay | Admitting: *Deleted

## 2013-01-29 NOTE — Progress Notes (Signed)
Received copy of ECHO done at [facility] on 01/15/13. Per report Left ventricular EF estimated by 2D at 50%. Report to MD desk for review.

## 2013-01-31 ENCOUNTER — Other Ambulatory Visit: Payer: Self-pay | Admitting: *Deleted

## 2013-01-31 ENCOUNTER — Ambulatory Visit (HOSPITAL_BASED_OUTPATIENT_CLINIC_OR_DEPARTMENT_OTHER): Payer: Medicare Other

## 2013-01-31 ENCOUNTER — Telehealth: Payer: Self-pay | Admitting: *Deleted

## 2013-01-31 DIAGNOSIS — Z452 Encounter for adjustment and management of vascular access device: Secondary | ICD-10-CM

## 2013-01-31 DIAGNOSIS — C9000 Multiple myeloma not having achieved remission: Secondary | ICD-10-CM

## 2013-01-31 DIAGNOSIS — Z95828 Presence of other vascular implants and grafts: Secondary | ICD-10-CM

## 2013-01-31 MED ORDER — SODIUM CHLORIDE 0.9 % IJ SOLN
10.0000 mL | Freq: Every day | INTRAMUSCULAR | Status: DC
  Administered 2013-01-31: 10 mL via INTRAVENOUS
  Filled 2013-01-31: qty 10

## 2013-01-31 NOTE — Telephone Encounter (Signed)
Call from [place] at [facility] requesting we set pt up for PICC flushes. Has double lumen PICC. Pt was being set up for transplant but needs to be evaluated by cardiology for possible infarct in the septum of his heart. Will need daily saline flushes until he returns to [facility] next week. [facility] will ship supplies to pt. Pt will come to [facility] for saline flush today with teaching.

## 2013-02-06 ENCOUNTER — Encounter: Payer: Self-pay | Admitting: *Deleted

## 2013-02-07 ENCOUNTER — Telehealth: Payer: Self-pay | Admitting: *Deleted

## 2013-02-07 NOTE — Telephone Encounter (Signed)
Called to cancel his April appointment here. Being admitted 3/20 at [facility] for his transplant.

## 2013-02-16 ENCOUNTER — Encounter: Payer: Self-pay | Admitting: Oncology

## 2013-02-20 ENCOUNTER — Ambulatory Visit: Payer: Medicare Other

## 2013-02-20 ENCOUNTER — Other Ambulatory Visit: Payer: Medicare Other | Admitting: Lab

## 2013-02-27 ENCOUNTER — Telehealth: Payer: Self-pay | Admitting: *Deleted

## 2013-02-27 NOTE — Telephone Encounter (Signed)
Called to report he is still in [facility]. Transplant went well and counts are getting better. Plans to return to [facility] on Friday morning. Requesting appointment to see Dr. Truett Perna "to check in" on Friday afternoon or on 4/17 or 4/18 or the following week. Will make MD aware of request.

## 2013-02-28 ENCOUNTER — Other Ambulatory Visit: Payer: Self-pay | Admitting: *Deleted

## 2013-02-28 DIAGNOSIS — E8581 Light chain (AL) amyloidosis: Secondary | ICD-10-CM

## 2013-03-02 NOTE — Telephone Encounter (Signed)
Called to request appointment with Dr. Truett Perna late Friday, 03/09/13 (will be back in town Friday afternoon) or the week of 4/21.

## 2013-03-05 ENCOUNTER — Other Ambulatory Visit: Payer: Self-pay | Admitting: *Deleted

## 2013-03-05 ENCOUNTER — Telehealth: Payer: Self-pay | Admitting: Oncology

## 2013-03-05 NOTE — Telephone Encounter (Signed)
S/W THE PT REGARDING HIS APPT FOR THIS Friday 03/09/2013. PER THE PT HE WILL NOT BE BACK IN TOWN UNTIL LATE Friday NIGHT. PT STATED THAT IF HE NEEDS TO BE BACK GIVE HIM A CALL. ADVISED THE PT THAT I WILL NEED TO SPEAK WITH D R SHERRILL. PT AWARE. SENT DR Truett Perna AN STAFF MESSAGE.

## 2013-03-05 NOTE — Telephone Encounter (Signed)
S/w pt re appt for 4/22. R/s from 4/18 per 4/14 pof.

## 2013-03-09 ENCOUNTER — Ambulatory Visit: Payer: Medicare Other | Admitting: Nurse Practitioner

## 2013-03-09 ENCOUNTER — Other Ambulatory Visit: Payer: Medicare Other | Admitting: Lab

## 2013-03-12 ENCOUNTER — Encounter: Payer: Self-pay | Admitting: Lab

## 2013-03-13 ENCOUNTER — Other Ambulatory Visit: Payer: Self-pay | Admitting: *Deleted

## 2013-03-13 ENCOUNTER — Ambulatory Visit (HOSPITAL_BASED_OUTPATIENT_CLINIC_OR_DEPARTMENT_OTHER): Payer: Medicare Other | Admitting: Oncology

## 2013-03-13 ENCOUNTER — Telehealth: Payer: Self-pay | Admitting: Oncology

## 2013-03-13 ENCOUNTER — Other Ambulatory Visit (HOSPITAL_BASED_OUTPATIENT_CLINIC_OR_DEPARTMENT_OTHER): Payer: Medicare Other | Admitting: Lab

## 2013-03-13 VITALS — BP 102/60 | HR 92 | Temp 97.1°F | Resp 18 | Ht 72.0 in | Wt 176.2 lb

## 2013-03-13 DIAGNOSIS — I2699 Other pulmonary embolism without acute cor pulmonale: Secondary | ICD-10-CM

## 2013-03-13 DIAGNOSIS — E859 Amyloidosis, unspecified: Secondary | ICD-10-CM

## 2013-03-13 DIAGNOSIS — C9000 Multiple myeloma not having achieved remission: Secondary | ICD-10-CM

## 2013-03-13 DIAGNOSIS — I82409 Acute embolism and thrombosis of unspecified deep veins of unspecified lower extremity: Secondary | ICD-10-CM

## 2013-03-13 DIAGNOSIS — D630 Anemia in neoplastic disease: Secondary | ICD-10-CM

## 2013-03-13 DIAGNOSIS — E8581 Light chain (AL) amyloidosis: Secondary | ICD-10-CM

## 2013-03-13 LAB — CBC WITH DIFFERENTIAL/PLATELET
BASO%: 3 % — ABNORMAL HIGH (ref 0.0–2.0)
Basophils Absolute: 0.2 10*3/uL — ABNORMAL HIGH (ref 0.0–0.1)
EOS%: 9.7 % — ABNORMAL HIGH (ref 0.0–7.0)
Eosinophils Absolute: 0.6 10*3/uL — ABNORMAL HIGH (ref 0.0–0.5)
HCT: 29.9 % — ABNORMAL LOW (ref 38.4–49.9)
HGB: 9.9 g/dL — ABNORMAL LOW (ref 13.0–17.1)
LYMPH%: 30.6 % (ref 14.0–49.0)
MCH: 29.8 pg (ref 27.2–33.4)
MCHC: 33.1 g/dL (ref 32.0–36.0)
MCV: 90.1 fL (ref 79.3–98.0)
MONO#: 1.2 10*3/uL — ABNORMAL HIGH (ref 0.1–0.9)
MONO%: 17.3 % — ABNORMAL HIGH (ref 0.0–14.0)
NEUT#: 2.6 10*3/uL (ref 1.5–6.5)
NEUT%: 39.4 % (ref 39.0–75.0)
Platelets: 133 10*3/uL — ABNORMAL LOW (ref 140–400)
RBC: 3.32 10*6/uL — ABNORMAL LOW (ref 4.20–5.82)
RDW: 19 % — ABNORMAL HIGH (ref 11.0–14.6)
WBC: 6.6 10*3/uL (ref 4.0–10.3)
lymph#: 2 10*3/uL (ref 0.9–3.3)

## 2013-03-13 LAB — PROTIME-INR
INR: 1.2 — ABNORMAL LOW (ref 2.00–3.50)
Protime: 14.4 Seconds — ABNORMAL HIGH (ref 10.6–13.4)

## 2013-03-13 MED ORDER — ONDANSETRON HCL 8 MG PO TABS: 8.0000 mg | ORAL_TABLET | Freq: Three times a day (TID) | ORAL | Status: DC | PRN

## 2013-03-13 MED ORDER — LORAZEPAM 0.5 MG PO TABS: 0.5000 mg | ORAL_TABLET | Freq: Three times a day (TID) | ORAL | Status: DC | PRN

## 2013-03-13 NOTE — Progress Notes (Signed)
[facility]    OFFICE PROGRESS NOTE   INTERVAL HISTORY:   He returns as scheduled. He underwent high-dose melphalan chemotherapy and received a stem cell infusion at [facility] on 02/09/2013. I do not have the discharge summary available today. He reports recovery of the hematologic indices after approximately 14 days. He continues to feel weak and has altered taste. No recurrent abdominal distention. He was taken off of anticoagulation when he returned home. No back pain. Good arm and leg strength. He has noted a decrease in the periorbital bruising.  Objective:  Vital signs in last 24 hours:  Blood pressure 102/60, pulse 92, temperature 97.1 F (36.2 C), temperature source Oral, resp. rate 18, height 6' (1.829 m), weight 176 lb 3.2 oz (79.924 kg).    HEENT: no thrush or ulcers. Macroglossia. A few ecchymoses surrounding the eyes Resp: lungs clear bilaterally Cardio: regular rate and rhythm GI: nondistended, no hepatosplenomegaly, nontender Vascular: trace edema at the right greater than left lower leg Neuro:good armand hand strength bilaterally    Lab Results:  Lab Results  Component Value Date   WBC 6.6 03/13/2013   HGB 9.9* 03/13/2013   HCT 29.9* 03/13/2013   MCV 90.1 03/13/2013   PLT 133* 03/13/2013  ANC 2.6    Medications: I have reviewed the patient's current medications.  Assessment/Plan: 1. 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 2. "Bruising" at the eyelids and mouth: Likely related to amyloidosis, persistent. 3. Numbness and loss of vibratory sense at the fingertips: This predated Velcade-based therapy, but worsened. Now much improved following cervical spine surgery. 4. Elevated serum free lambda light chains. The lambda light chains were lower on November 16 and slightly higher on 12/14/2011. 5. Lambda light chain proteinuria. 6. Bone marrow plasmacytosis: Variable increase in plasma cells noted on the bone marrow biopsy 07/29/2011 with plasma  cells estimated to represent between 4% and 20% of the cellular population. 7. Remote history of prostate cancer. 8. Sleep apnea. 9. Dyslipidemia. 10. Report of pneumonia on 2 occasions in 2011. 11. Admission to a hospital in [place], [place] October 2012 with "pneumonia." A chest x-ray at [facility] on November 2 was negative .  12. Low serum immunoglobulin G level. 13. Plasma cell dyscrasia with associated amyloidosis.  a. Initiation of systemic therapy with Cytoxan, Velcade, and Decadron 08/12/2011. Cycle #2 was initiated on 09/16/2011. Cycle #3 was initiated on 10/15/2011. Velcade was placed on hold due to neuropathy. b. The serum free lambda light chains were decreased on October 08 2011. c. The serum free lambda light chains were slightly increased on 12/14/2011, lower on 12/29/2011 and 02/02/2012. d. Initiation of Revlimid/Decadron February 2013, cycle 2 started on 01/28/2012. e. High-dose melphalan chemotherapy followed by autologous stem cell infusion on 02/09/2013 15. Loss of "balance "and proximal motor weakness secondary to cervical stenosis-status post decompression surgery on 02/19/2012. Improved, but not resolved stenosis with mass effect on the spinal cord noted on a repeat MRI 03/28/2012. He underwent further decompression surgery on 04/06/2012. The ataxia, weakness, and peripheral numbness is much improved.  16. Right lower Extremity deep vein thrombosis 02/02/2012-a Doppler ultrasound confirmed a gastrocnemius and peroneal deep vein thrombosis. An IVC filter was placed prior to surgery and he was maintained on Lovenox. The IVC filter was removed on 05/04/2012.  17. Anorexia following cervical spine surgery. Improved.  18. Episode of flank pain and hematuria in 03/31/2012-etiology unclear. He completed a course of antibiotics. The hematuria and pain have resolved.  19. Rectal wall thickening  noted on a CT of the pelvis 03/31/2012.  20. Recent colonoscopy showed an area of  abnormality at the rectum with biopsy positive for amyloid.  21. Lung nodule noted on the CT 03/31/2012  22. Lumbar stenosis-status post surgery on 07/31/2012.  23. Status post stem cell collection.  24.Rright internal jugular thrombosis, 10/22/2012, likely related to pheresis catheter and HIT  25. Admission with acute right pulmonary embolism 10/27/2012  26. Superior mesenteric vein/portal vein thrombosis diagnosed on a CT of the abdomen 10/28/2012 and 10/29/2012-treated with Argatroban , maintained on Coumadin anticoagulation until he hospital admission March 2014 for stem cell therapy 27. HIT confirmed on a serotonin release assay 10/29/2012 28. Renal insufficiency during the December 2013 hospital admission-likely related to dehydration and? Contrast nephropathy-partially improved , he will return for a chemistry panel 03/14/2013 29. Anemia secondary to multiple myeloma and recent chemotherapy  Disposition:  He continues to recover from the stem cell procedure. He is scheduled for a followup appointment in urology at [facility] to evaluate "hematuria". Mr. Bradt will return for an office visit here in one month. He will undergo restaging evaluations for the plasma cell dyscrasia/amyloidosis at [facility].   [name], MD  03/13/2013  8:56 PM

## 2013-03-13 NOTE — Telephone Encounter (Signed)
gv pt appt schedule for April and May. °

## 2013-03-14 ENCOUNTER — Other Ambulatory Visit (HOSPITAL_BASED_OUTPATIENT_CLINIC_OR_DEPARTMENT_OTHER): Payer: Medicare Other | Admitting: Lab

## 2013-03-14 ENCOUNTER — Encounter: Payer: Self-pay | Admitting: Pharmacist

## 2013-03-14 DIAGNOSIS — C9000 Multiple myeloma not having achieved remission: Secondary | ICD-10-CM

## 2013-03-14 LAB — BASIC METABOLIC PANEL (CC13)
BUN: 18.9 mg/dL (ref 7.0–26.0)
CO2: 24 mEq/L (ref 22–29)
Calcium: 9.4 mg/dL (ref 8.4–10.4)
Chloride: 106 mEq/L (ref 98–107)
Creatinine: 1.6 mg/dL — ABNORMAL HIGH (ref 0.7–1.3)
Glucose: 94 mg/dl (ref 70–99)
Potassium: 4.2 mEq/L (ref 3.5–5.1)
Sodium: 138 mEq/L (ref 136–145)

## 2013-03-14 LAB — CBC WITH DIFFERENTIAL/PLATELET
BASO%: 2.6 % — ABNORMAL HIGH (ref 0.0–2.0)
Basophils Absolute: 0.2 10*3/uL — ABNORMAL HIGH (ref 0.0–0.1)
EOS%: 6.9 % (ref 0.0–7.0)
Eosinophils Absolute: 0.6 10*3/uL — ABNORMAL HIGH (ref 0.0–0.5)
HCT: 30.3 % — ABNORMAL LOW (ref 38.4–49.9)
HGB: 10 g/dL — ABNORMAL LOW (ref 13.0–17.1)
LYMPH%: 29.8 % (ref 14.0–49.0)
MCH: 29.6 pg (ref 27.2–33.4)
MCHC: 33 g/dL (ref 32.0–36.0)
MCV: 89.6 fL (ref 79.3–98.0)
MONO#: 1.4 10*3/uL — ABNORMAL HIGH (ref 0.1–0.9)
MONO%: 17 % — ABNORMAL HIGH (ref 0.0–14.0)
NEUT#: 3.5 10*3/uL (ref 1.5–6.5)
NEUT%: 43.7 % (ref 39.0–75.0)
Platelets: 146 10*3/uL (ref 140–400)
RBC: 3.38 10*6/uL — ABNORMAL LOW (ref 4.20–5.82)
RDW: 19 % — ABNORMAL HIGH (ref 11.0–14.6)
WBC: 8.1 10*3/uL (ref 4.0–10.3)
lymph#: 2.4 10*3/uL (ref 0.9–3.3)
nRBC: 0 % (ref 0–0)

## 2013-03-14 NOTE — Progress Notes (Signed)
Per Dr. Truett Perna, pt has been taken off Coumadin post-SCT.  We have discharged pt from our Coumadin clinic. Ebony Hail, Pharm.D., CPP 03/14/2013@9 :36 AM

## 2013-03-15 ENCOUNTER — Encounter: Payer: Self-pay | Admitting: Oncology

## 2013-03-15 ENCOUNTER — Other Ambulatory Visit (HOSPITAL_BASED_OUTPATIENT_CLINIC_OR_DEPARTMENT_OTHER): Payer: Medicare Other

## 2013-03-15 ENCOUNTER — Telehealth: Payer: Self-pay | Admitting: *Deleted

## 2013-03-15 DIAGNOSIS — R0602 Shortness of breath: Secondary | ICD-10-CM

## 2013-03-15 DIAGNOSIS — C9000 Multiple myeloma not having achieved remission: Secondary | ICD-10-CM

## 2013-03-15 NOTE — Telephone Encounter (Signed)
Message copied by Caleb Popp on Thu Mar 15, 2013 10:28 AM ------      Message from: [name] B      Created: Wed Mar 14, 2013  7:15 PM       Please call patient, copy bmet to cardiology, ?why did he get another cbc ------

## 2013-03-15 NOTE — Telephone Encounter (Signed)
Per staff message and POF I have scheduled appts.  JMW  

## 2013-03-16 LAB — BRAIN NATRIURETIC PEPTIDE: Brain Natriuretic Peptide: 250.7 pg/mL — ABNORMAL HIGH (ref 0.0–100.0)

## 2013-03-19 ENCOUNTER — Telehealth: Payer: Self-pay | Admitting: Dietician

## 2013-03-19 NOTE — Telephone Encounter (Signed)
Brief Outpatient Oncology Nutrition Note  Patient has been identified to be at risk on malnutrition screen.   Wt Readings from Last 10 Encounters:  03/13/13 176 lb 3.2 oz (79.924 kg)  01/23/13 189 lb 3.2 oz (85.821 kg)  11/28/12 190 lb 12.8 oz (86.546 kg)  10/27/12 188 lb 7.9 oz (85.5 kg)  10/27/12 188 lb 9.6 oz (85.548 kg)  10/26/12 188 lb 8 oz (85.503 kg)  08/11/12 190 lb 12.8 oz (86.546 kg)  07/31/12 194 lb 7.1 oz (88.2 kg)  07/31/12 194 lb 7.1 oz (88.2 kg)  07/20/12 194 lb 6.4 oz (88.179 kg)    Called and spoke to patient who is s/p stem cell procedure at [facility]  Patient experienced taste alteration but states that is improving.  Trying to eat.  Has started taking progein shake.  "Are there any that taste better?"  Discussed strategies to improve taste as well as alternative supplements.  Patient states that he was unable to find [employer].  Told patient where it is located.  No questions at this time.    RD available as needed.  Oran Rein, RD, LDN

## 2013-03-22 ENCOUNTER — Telehealth: Payer: Self-pay | Admitting: *Deleted

## 2013-03-22 NOTE — Telephone Encounter (Signed)
Made wife aware letter was ready for jury duty excusal. She requests it be mailed to courthouse from [facility]. Mailed as requested.

## 2013-04-05 ENCOUNTER — Other Ambulatory Visit: Payer: Self-pay | Admitting: *Deleted

## 2013-04-05 ENCOUNTER — Telehealth: Payer: Self-pay | Admitting: Oncology

## 2013-04-05 NOTE — Progress Notes (Signed)
Reports not needing to see Dr. Truett Perna until after he is seen again at [facility] on 04/19/13. Requests to cancel May appointments and reschedule. POF to scheduler.

## 2013-04-05 NOTE — Telephone Encounter (Signed)
, °

## 2013-04-06 ENCOUNTER — Ambulatory Visit: Payer: Medicare Other | Admitting: Oncology

## 2013-04-09 ENCOUNTER — Other Ambulatory Visit: Payer: Medicare Other | Admitting: Lab

## 2013-04-09 ENCOUNTER — Ambulatory Visit: Payer: Medicare Other | Admitting: Oncology

## 2013-05-08 ENCOUNTER — Telehealth: Payer: Self-pay | Admitting: Oncology

## 2013-05-08 ENCOUNTER — Ambulatory Visit (HOSPITAL_BASED_OUTPATIENT_CLINIC_OR_DEPARTMENT_OTHER): Payer: Medicare Other | Admitting: Oncology

## 2013-05-08 ENCOUNTER — Other Ambulatory Visit (HOSPITAL_BASED_OUTPATIENT_CLINIC_OR_DEPARTMENT_OTHER): Payer: Medicare Other | Admitting: Lab

## 2013-05-08 VITALS — BP 109/65 | HR 84 | Temp 97.7°F | Resp 18 | Ht 72.0 in | Wt 172.8 lb

## 2013-05-08 DIAGNOSIS — C9000 Multiple myeloma not having achieved remission: Secondary | ICD-10-CM

## 2013-05-08 LAB — CBC WITH DIFFERENTIAL/PLATELET
BASO%: 1 % (ref 0.0–2.0)
Basophils Absolute: 0.1 10*3/uL (ref 0.0–0.1)
EOS%: 3 % (ref 0.0–7.0)
Eosinophils Absolute: 0.2 10*3/uL (ref 0.0–0.5)
HCT: 29.8 % — ABNORMAL LOW (ref 38.4–49.9)
HGB: 10.5 g/dL — ABNORMAL LOW (ref 13.0–17.1)
LYMPH%: 20.2 % (ref 14.0–49.0)
MCH: 34.2 pg — ABNORMAL HIGH (ref 27.2–33.4)
MCHC: 35.2 g/dL (ref 32.0–36.0)
MCV: 97.1 fL (ref 79.3–98.0)
MONO#: 0.7 10*3/uL (ref 0.1–0.9)
MONO%: 11 % (ref 0.0–14.0)
NEUT#: 4.3 10*3/uL (ref 1.5–6.5)
NEUT%: 64.8 % (ref 39.0–75.0)
Platelets: 175 10*3/uL (ref 140–400)
RBC: 3.07 10*6/uL — ABNORMAL LOW (ref 4.20–5.82)
RDW: 17 % — ABNORMAL HIGH (ref 11.0–14.6)
WBC: 6.7 10*3/uL (ref 4.0–10.3)
lymph#: 1.3 10*3/uL (ref 0.9–3.3)

## 2013-05-08 NOTE — Telephone Encounter (Signed)
gv and printed appt sched and avs for pt  °

## 2013-05-08 NOTE — Progress Notes (Signed)
[facility]    OFFICE PROGRESS NOTE   INTERVAL HISTORY:   He returns as scheduled. His energy level is gradually improving. He complains of dysphagia. Good arm and leg strength. The right leg swells during the day when he is on his feet.  Objective:  Vital signs in last 24 hours:  Blood pressure 109/65, pulse 84, temperature 97.7 F (36.5 C), temperature source Oral, resp. rate 18, height 6' (1.829 m), weight 172 lb 12.8 oz (78.382 kg).    HEENT: Macroglossia, neck without mass, no thrush Resp: Lungs clear bilaterally Cardio: Regular rate and rhythm GI: No hepatosplenomegaly Vascular: Trace ,Pitting edema at the right lower leg and ankle Neuro: Good arm and hand strength bilaterally Skin: Small ecchymoses surrounding the eyes     Lab Results:  Lab Results  Component Value Date   WBC 6.7 05/08/2013   HGB 10.5* 05/08/2013   HCT 29.8* 05/08/2013   MCV 97.1 05/08/2013   PLT 175 05/08/2013   ANC 4.3    Medications: I have reviewed the patient's current medications.  Assessment/Plan: 1. 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 2. "Bruising" at the eyelids and mouth: Likely related to amyloidosis, persistent. 3. Numbness and loss of vibratory sense at the fingertips: This predated Velcade-based therapy, but worsened. Now much improved following cervical spine surgery. 4. Elevated serum free lambda light chains. The lambda light chains were lower on November 16 and slightly higher on 12/14/2011. 5. Lambda light chain proteinuria. 6. Bone marrow plasmacytosis: Variable increase in plasma cells noted on the bone marrow biopsy 07/29/2011 with plasma cells estimated to represent between 4% and 20% of the cellular population. 7. Remote history of prostate cancer. 8. Sleep apnea. 9. Dyslipidemia. 10. Report of pneumonia on 2 occasions in 2011. 11. Admission to a hospital in [place], [place] October 2012 with "pneumonia." A chest x-ray at [facility]  on November 2 was negative .  12. Low serum immunoglobulin G level. 13. Plasma cell dyscrasia with associated amyloidosis.  a. Initiation of systemic therapy with Cytoxan, Velcade, and Decadron 08/12/2011. Cycle #2 was initiated on 09/16/2011. Cycle #3 was initiated on 10/15/2011. Velcade was placed on hold due to neuropathy. b. The serum free lambda light chains were decreased on October 08 2011. c. The serum free lambda light chains were slightly increased on 12/14/2011, lower on 12/29/2011 and 02/02/2012. d. Initiation of Revlimid/Decadron February 2013, cycle 2 started on 01/28/2012. e. High-dose melphalan chemotherapy followed by autologous stem cell infusion on 02/09/2013 15. Loss of "balance "and proximal motor weakness secondary to cervical stenosis-status post decompression surgery on 02/19/2012. Improved, but not resolved stenosis with mass effect on the spinal cord noted on a repeat MRI 03/28/2012. He underwent further decompression surgery on 04/06/2012. The ataxia, weakness, and peripheral numbness is much improved.  16. Right lower Extremity deep vein thrombosis 02/02/2012-a Doppler ultrasound confirmed a gastrocnemius and peroneal deep vein thrombosis. An IVC filter was placed prior to surgery and he was maintained on Lovenox. The IVC filter was removed on 05/04/2012.  17. Anorexia following cervical spine surgery. Improved.  18. Episode of flank pain and hematuria in 03/31/2012-etiology unclear. He completed a course of antibiotics. The hematuria and pain have resolved.  19. Rectal wall thickening noted on a CT of the pelvis 03/31/2012.  20. colonoscopy showed an area of abnormality at the rectum with biopsy positive for amyloid.  21. Lung nodule noted on the CT 03/31/2012  22. Lumbar stenosis-status post surgery on 07/31/2012.  23. Status  post stem cell collection.  24.Rright internal jugular thrombosis, 10/22/2012, likely related to pheresis catheter and HIT  25. Admission with  acute right pulmonary embolism 10/27/2012  26. Superior mesenteric vein/portal vein thrombosis diagnosed on a CT of the abdomen 10/28/2012 and 10/29/2012-treated with Argatroban , maintained on Coumadin anticoagulation until he hospital admission March 2014 for stem cell therapy  27. HIT confirmed on a serotonin release assay 10/29/2012  28. Renal insufficiency during the December 2013 hospital admission-likely related to dehydration and? Contrast nephropathy-partially improved , he will return for a chemistry panel 03/14/2013  29. Anemia secondary to multiple myeloma and recent chemotherapy-stable 30. Dysphagia-? Secondary to amyloidosis, he will discuss this with Dr. Marissa Calamity next week. We can proceed with a barium swallow if the symptoms persist.   Disposition:  [name] continues to recover from the stem cell procedure. He is undergoing restaging evaluations for the myeloma/amyloidosis at [facility]. The right lower extremity pitting edema is most likely related to the previous DVT and post phlebitic syndrome. He is scheduled to see Dr. Marissa Calamity next week. He will return for an office visit here in 2 months. We will see him sooner as needed.   [name], MD  05/08/2013  9:41 AM

## 2013-05-16 ENCOUNTER — Telehealth: Payer: Self-pay | Admitting: *Deleted

## 2013-05-16 NOTE — Telephone Encounter (Addendum)
Reports he was seen at [facility] by Dr. Marissa Calamity on 6/24 and he has recommended a velcade based regimen with Dr. Truett Perna. Calling to schedule appointment to get started. Office has not yet received progress note from the visit at [facility]. Called pt & reported that we had not received Dr Marissa Calamity note as of yet & unable to pull it up on our computer system.  Suggested that he might not have dictated or released note yet & encouraged to call & ask for report to be sent to Dr. Truett Perna.  Informed that Dr Truett Perna will wait on note to schedule this. MH

## 2013-05-18 ENCOUNTER — Telehealth: Payer: Self-pay | Admitting: Dietician

## 2013-05-18 NOTE — Telephone Encounter (Signed)
Brief Outpatient Oncology Nutrition Note  Patient has been identified to be at risk on malnutrition screen.  Wt Readings from Last 10 Encounters:  05/08/13 172 lb 12.8 oz (78.382 kg)  03/13/13 176 lb 3.2 oz (79.924 kg)  01/23/13 189 lb 3.2 oz (85.821 kg)  11/28/12 190 lb 12.8 oz (86.546 kg)  10/27/12 188 lb 7.9 oz (85.5 kg)  10/27/12 188 lb 9.6 oz (85.548 kg)  10/26/12 188 lb 8 oz (85.503 kg)  08/11/12 190 lb 12.8 oz (86.546 kg)  07/31/12 194 lb 7.1 oz (88.2 kg)  07/31/12 194 lb 7.1 oz (88.2 kg)    Called patient due to continued weight loss.  Patient reports eating well now but unable to gain weight.  Has not been taking nutritional supplements regularly and encouraged him to drink 1-2 per day.  Encouraged 3 meals per day plus snacks and gave tips to increase calories and protein. Patient to call with any questions.  Oran Rein, RD, LDN

## 2013-05-21 ENCOUNTER — Telehealth: Payer: Self-pay | Admitting: *Deleted

## 2013-05-21 NOTE — Telephone Encounter (Signed)
Received fax from Dr. Marissa Calamity' office. He recommends Velcade D1,4,8 and 11 with Decadron. Orders sent to schedulers for Lab/MD/chemo 04/28/13.

## 2013-05-22 ENCOUNTER — Telehealth: Payer: Self-pay | Admitting: *Deleted

## 2013-05-22 NOTE — Telephone Encounter (Signed)
Per staff phone call and POF I have schedueld appts. I have called the patient.  JMW

## 2013-05-24 ENCOUNTER — Other Ambulatory Visit: Payer: Self-pay | Admitting: *Deleted

## 2013-05-24 DIAGNOSIS — C9000 Multiple myeloma not having achieved remission: Secondary | ICD-10-CM

## 2013-05-24 MED ORDER — DEXAMETHASONE 4 MG PO TABS: ORAL_TABLET | ORAL | Status: DC

## 2013-05-24 NOTE — Telephone Encounter (Signed)
Called patient's cell number to confirm current pharmacy use.  Reviewed with him to take five pills day 1 and day two of each chemotherapy treatement with food no later than 4:00 pm to diminish the steroids interference with sleep.  eRx order sent to [facility].

## 2013-05-27 ENCOUNTER — Other Ambulatory Visit: Payer: Self-pay | Admitting: Oncology

## 2013-05-28 ENCOUNTER — Ambulatory Visit (HOSPITAL_BASED_OUTPATIENT_CLINIC_OR_DEPARTMENT_OTHER): Payer: Medicare Other | Admitting: Oncology

## 2013-05-28 ENCOUNTER — Telehealth: Payer: Self-pay | Admitting: Oncology

## 2013-05-28 ENCOUNTER — Other Ambulatory Visit (HOSPITAL_BASED_OUTPATIENT_CLINIC_OR_DEPARTMENT_OTHER): Payer: Medicare Other | Admitting: Lab

## 2013-05-28 ENCOUNTER — Ambulatory Visit (HOSPITAL_BASED_OUTPATIENT_CLINIC_OR_DEPARTMENT_OTHER): Payer: Medicare Other

## 2013-05-28 VITALS — BP 106/64 | HR 85 | Temp 97.5°F | Resp 18 | Ht 72.0 in | Wt 179.9 lb

## 2013-05-28 DIAGNOSIS — C9000 Multiple myeloma not having achieved remission: Secondary | ICD-10-CM

## 2013-05-28 DIAGNOSIS — R5383 Other fatigue: Secondary | ICD-10-CM

## 2013-05-28 DIAGNOSIS — D6481 Anemia due to antineoplastic chemotherapy: Secondary | ICD-10-CM

## 2013-05-28 DIAGNOSIS — R5381 Other malaise: Secondary | ICD-10-CM

## 2013-05-28 DIAGNOSIS — T451X5A Adverse effect of antineoplastic and immunosuppressive drugs, initial encounter: Secondary | ICD-10-CM

## 2013-05-28 DIAGNOSIS — Z5112 Encounter for antineoplastic immunotherapy: Secondary | ICD-10-CM

## 2013-05-28 DIAGNOSIS — M7989 Other specified soft tissue disorders: Secondary | ICD-10-CM

## 2013-05-28 LAB — CBC WITH DIFFERENTIAL/PLATELET
BASO%: 1.1 % (ref 0.0–2.0)
Basophils Absolute: 0.1 10*3/uL (ref 0.0–0.1)
EOS%: 2.7 % (ref 0.0–7.0)
Eosinophils Absolute: 0.2 10*3/uL (ref 0.0–0.5)
HCT: 29.3 % — ABNORMAL LOW (ref 38.4–49.9)
HGB: 10.2 g/dL — ABNORMAL LOW (ref 13.0–17.1)
LYMPH%: 23.8 % (ref 14.0–49.0)
MCH: 34.2 pg — ABNORMAL HIGH (ref 27.2–33.4)
MCHC: 34.6 g/dL (ref 32.0–36.0)
MCV: 98.7 fL — ABNORMAL HIGH (ref 79.3–98.0)
MONO#: 0.7 10*3/uL (ref 0.1–0.9)
MONO%: 11.7 % (ref 0.0–14.0)
NEUT#: 3.6 10*3/uL (ref 1.5–6.5)
NEUT%: 60.7 % (ref 39.0–75.0)
Platelets: 142 10*3/uL (ref 140–400)
RBC: 2.97 10*6/uL — ABNORMAL LOW (ref 4.20–5.82)
RDW: 14.2 % (ref 11.0–14.6)
WBC: 5.9 10*3/uL (ref 4.0–10.3)
lymph#: 1.4 10*3/uL (ref 0.9–3.3)

## 2013-05-28 MED ORDER — PROCHLORPERAZINE MALEATE 10 MG PO TABS: 10.0000 mg | ORAL_TABLET | Freq: Four times a day (QID) | ORAL | Status: DC | PRN

## 2013-05-28 MED ORDER — BORTEZOMIB CHEMO SQ INJECTION 3.5 MG (2.5MG/ML)
1.3000 mg/m2 | Freq: Once | INTRAMUSCULAR | Status: AC
  Administered 2013-05-28: 2.5 mg via SUBCUTANEOUS
  Filled 2013-05-28: qty 2.5

## 2013-05-28 MED ORDER — ONDANSETRON HCL 8 MG PO TABS
8.0000 mg | ORAL_TABLET | Freq: Once | ORAL | Status: AC
  Administered 2013-05-28: 8 mg via ORAL

## 2013-05-28 NOTE — Progress Notes (Signed)
[facility]    OFFICE PROGRESS NOTE   INTERVAL HISTORY:   He returns prior to a scheduled visit. He was evaluated at [facility] last week and additional systemic therapy with Velcade/Decadron is recommended.  He continues to have malaise and some degree of anorexia. He reports easy bruising at the fingertips. Bilateral leg swelling.  Objective:  Vital signs in last 24 hours:  Blood pressure 106/64, pulse 85, temperature 97.5 F (36.4 C), temperature source Oral, resp. rate 18, height 6' (1.829 m), weight 179 lb 14.4 oz (81.602 kg).    HEENT: macroglossia Resp: lungs clear bilaterally Cardio: regular rate and rhythm GI: no hepatosplenomegaly Vascular: pitting edema at the right greater than left lower leg, 1+ Neuro:the motor exam appears intact in the upper and lower extremity  Skin:small periorbital ecchymoses. Skin thickening and ecchymotic areas at several of the distal fingers   Portacath/PICC-without erythema  Lab Results:  Lab Results  Component Value Date   WBC 5.9 05/28/2013   HGB 10.2* 05/28/2013   HCT 29.3* 05/28/2013   MCV 98.7* 05/28/2013   PLT 142 05/28/2013  ANC 3.6    Medications: I have reviewed the patient's current medications.  Assessment/Plan: 1. 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 2. "Bruising" at the eyelids and mouth: Likely related to amyloidosis, persistent. 3. Numbness and loss of vibratory sense at the fingertips: This predated Velcade-based therapy, but worsened. Now much improved following cervical spine surgery. 4. Elevated serum free lambda light chains. The lambda light chains were lower on November 16 and slightly higher on 12/14/2011. 5. Lambda light chain proteinuria. 6. Bone marrow plasmacytosis: Variable increase in plasma cells noted on the bone marrow biopsy 07/29/2011 with plasma cells estimated to represent between 4% and 20% of the cellular population. 7. Remote history of prostate cancer. 8. Sleep  apnea. 9. Dyslipidemia. 10. Report of pneumonia on 2 occasions in 2011. 11. Admission to a hospital in [place], [place] October 2012 with "pneumonia." A chest x-ray at [facility] on November 2 was negative .  12. Low serum immunoglobulin G level. 13. Plasma cell dyscrasia with associated amyloidosis.  a. Initiation of systemic therapy with Cytoxan, Velcade, and Decadron 08/12/2011. Cycle #2 was initiated on 09/16/2011. Cycle #3 was initiated on 10/15/2011. Velcade was placed on hold due to neuropathy. b. The serum free lambda light chains were decreased on October 08 2011. c. The serum free lambda light chains were slightly increased on 12/14/2011, lower on 12/29/2011 and 02/02/2012. d. Initiation of Revlimid/Decadron February 2013, cycle 2 started on 01/28/2012. e. High-dose melphalan chemotherapy followed by autologous stem cell infusion on 02/09/2013 15. Loss of "balance "and proximal motor weakness secondary to cervical stenosis-status post decompression surgery on 02/19/2012. Improved, but not resolved stenosis with mass effect on the spinal cord noted on a repeat MRI 03/28/2012. He underwent further decompression surgery on 04/06/2012. The ataxia, weakness, and peripheral numbness is much improved.  16. Right lower Extremity deep vein thrombosis 02/02/2012-a Doppler ultrasound confirmed a gastrocnemius and peroneal deep vein thrombosis. An IVC filter was placed prior to surgery and he was maintained on Lovenox. The IVC filter was removed on 05/04/2012.  17. Anorexia following cervical spine surgery. Improved.  18. Episode of flank pain and hematuria in 03/31/2012-etiology unclear. He completed a course of antibiotics. The hematuria and pain have resolved.  19. Rectal wall thickening noted on a CT of the pelvis 03/31/2012.  20. colonoscopy showed an area of abnormality at the rectum with biopsy positive for amyloid.  21. Lung nodule noted on the CT 03/31/2012  22. Lumbar  stenosis-status post surgery on 07/31/2012.  23. Status post stem cell collection.  24.Rright internal jugular thrombosis, 10/22/2012, likely related to pheresis catheter and HIT  25. Admission with acute right pulmonary embolism 10/27/2012  26. Superior mesenteric vein/portal vein thrombosis diagnosed on a CT of the abdomen 10/28/2012 and 10/29/2012-treated with Argatroban , maintained on Coumadin anticoagulation until he hospital admission March 2014 for stem cell therapy  27. HIT confirmed on a serotonin release assay 10/29/2012  28. History ofRenal insufficiency during the December 2013 hospital admission-likely related to dehydration and? Contrast nephropathy 29. Dysphagia-? Secondary to amyloidosis, reported when he was here on 05/08/2013. He did not complain of dysphagia today 30. Anemia secondary to multiple myeloma and chemotherapy   Disposition:  [facility] myeloma service has recommended additional systemic therapy with Velcade/Decadron. The plan is to begin Velcade on a day 1, 4, 8, 11 schedule on a 21 day cycle today. We again reviewed the potential toxicities associated with Velcadeincluding the chance for hematologic toxicity, nausea, diarrhea, and neuropathy.he agrees to proceed. He is maintained on valacyclovir prophylaxis.  Mr. Ishler will return for an office visitduring cycle 2.I will contact Dr. Marissa Calamity regarding the plan for a restaging evaluation.   [name], MD  05/28/2013  8:26 PM

## 2013-05-28 NOTE — Telephone Encounter (Signed)
gv pt appt schedule for July and August. appt for 8/4 scheduled for 9:15am. appt could not be scheduled @ 4:15pm due to pt has tx to follow.

## 2013-05-28 NOTE — Patient Instructions (Addendum)
[facility] Discharge Instructions for Patients Receiving Chemotherapy  Today you received the following chemotherapy agents: Velcade.  To help prevent nausea and vomiting after your treatment, we encourage you to take your nausea medication as prescribed.   If you develop nausea and vomiting that is not controlled by your nausea medication, call the clinic.   BELOW ARE SYMPTOMS THAT SHOULD BE REPORTED IMMEDIATELY:  *FEVER GREATER THAN 100.5 F  *CHILLS WITH OR WITHOUT FEVER  NAUSEA AND VOMITING THAT IS NOT CONTROLLED WITH YOUR NAUSEA MEDICATION  *UNUSUAL SHORTNESS OF BREATH  *UNUSUAL BRUISING OR BLEEDING  TENDERNESS IN MOUTH AND THROAT WITH OR WITHOUT PRESENCE OF ULCERS  *URINARY PROBLEMS  *BOWEL PROBLEMS  UNUSUAL RASH Items with * indicate a potential emergency and should be followed up as soon as possible.  Feel free to call the clinic you have any questions or concerns. The clinic phone number is [phone].    

## 2013-05-30 ENCOUNTER — Telehealth: Payer: Self-pay | Admitting: Oncology

## 2013-05-30 ENCOUNTER — Telehealth: Payer: Self-pay | Admitting: *Deleted

## 2013-05-30 DIAGNOSIS — C9000 Multiple myeloma not having achieved remission: Secondary | ICD-10-CM

## 2013-05-30 NOTE — Telephone Encounter (Signed)
Talked to gave hiim appt for lab and chemo for 7/10

## 2013-05-30 NOTE — Telephone Encounter (Signed)
Spoke with pt, informed him the plan is to check labs with each cycle of chemo. Pt to return to [facility] after Cycle 2. He voiced understanding. Requested to reschedule 7/31 chemo appt to later in the day due to [facility] appt that morning. Left message for chemo scheduler.

## 2013-05-31 ENCOUNTER — Other Ambulatory Visit (HOSPITAL_BASED_OUTPATIENT_CLINIC_OR_DEPARTMENT_OTHER): Payer: Medicare Other | Admitting: Lab

## 2013-05-31 ENCOUNTER — Ambulatory Visit (HOSPITAL_BASED_OUTPATIENT_CLINIC_OR_DEPARTMENT_OTHER): Payer: Medicare Other

## 2013-05-31 ENCOUNTER — Telehealth: Payer: Self-pay | Admitting: Oncology

## 2013-05-31 VITALS — BP 115/65 | HR 96 | Temp 96.5°F

## 2013-05-31 DIAGNOSIS — Z5112 Encounter for antineoplastic immunotherapy: Secondary | ICD-10-CM

## 2013-05-31 DIAGNOSIS — C9000 Multiple myeloma not having achieved remission: Secondary | ICD-10-CM

## 2013-05-31 MED ORDER — BORTEZOMIB CHEMO SQ INJECTION 3.5 MG (2.5MG/ML)
1.3000 mg/m2 | Freq: Once | INTRAMUSCULAR | Status: AC
  Administered 2013-05-31: 2.5 mg via SUBCUTANEOUS
  Filled 2013-05-31: qty 2.5

## 2013-05-31 MED ORDER — ONDANSETRON HCL 8 MG PO TABS
8.0000 mg | ORAL_TABLET | Freq: Once | ORAL | Status: AC
  Administered 2013-05-31: 8 mg via ORAL

## 2013-05-31 NOTE — Patient Instructions (Signed)
[facility] Discharge Instructions for Patients Receiving Chemotherapy  Today you received the following chemotherapy agents: Velcade.  To help prevent nausea and vomiting after your treatment, we encourage you to take your nausea medication as prescribed.   If you develop nausea and vomiting that is not controlled by your nausea medication, call the clinic.   BELOW ARE SYMPTOMS THAT SHOULD BE REPORTED IMMEDIATELY:  *FEVER GREATER THAN 100.5 F  *CHILLS WITH OR WITHOUT FEVER  NAUSEA AND VOMITING THAT IS NOT CONTROLLED WITH YOUR NAUSEA MEDICATION  *UNUSUAL SHORTNESS OF BREATH  *UNUSUAL BRUISING OR BLEEDING  TENDERNESS IN MOUTH AND THROAT WITH OR WITHOUT PRESENCE OF ULCERS  *URINARY PROBLEMS  *BOWEL PROBLEMS  UNUSUAL RASH Items with * indicate a potential emergency and should be followed up as soon as possible.  Feel free to call the clinic you have any questions or concerns. The clinic phone number is [phone].    

## 2013-05-31 NOTE — Telephone Encounter (Signed)
Gave pt appt for July and August 2014

## 2013-06-04 ENCOUNTER — Ambulatory Visit (HOSPITAL_BASED_OUTPATIENT_CLINIC_OR_DEPARTMENT_OTHER): Payer: Medicare Other

## 2013-06-04 ENCOUNTER — Other Ambulatory Visit: Payer: Medicare Other | Admitting: Lab

## 2013-06-04 ENCOUNTER — Other Ambulatory Visit (HOSPITAL_BASED_OUTPATIENT_CLINIC_OR_DEPARTMENT_OTHER): Payer: Medicare Other | Admitting: Lab

## 2013-06-04 VITALS — BP 103/62 | HR 72 | Temp 97.8°F

## 2013-06-04 DIAGNOSIS — C9000 Multiple myeloma not having achieved remission: Secondary | ICD-10-CM

## 2013-06-04 DIAGNOSIS — Z5112 Encounter for antineoplastic immunotherapy: Secondary | ICD-10-CM

## 2013-06-04 LAB — KAPPA/LAMBDA LIGHT CHAINS
Kappa free light chain: 0.91 mg/dL (ref 0.33–1.94)
Kappa:Lambda Ratio: 0.07 — ABNORMAL LOW (ref 0.26–1.65)
Lambda Free Lght Chn: 13.4 mg/dL — ABNORMAL HIGH (ref 0.57–2.63)

## 2013-06-04 LAB — SPEP & IFE WITH QIG
Albumin ELP: 58.3 % (ref 55.8–66.1)
Alpha-1-Globulin: 4.7 % (ref 2.9–4.9)
Alpha-2-Globulin: 13.1 % — ABNORMAL HIGH (ref 7.1–11.8)
Beta 2: 3.8 % (ref 3.2–6.5)
Beta Globulin: 6.1 % (ref 4.7–7.2)
Gamma Globulin: 14 % (ref 11.1–18.8)
IgA: 40 mg/dL — ABNORMAL LOW (ref 68–379)
IgG (Immunoglobin G), Serum: 912 mg/dL (ref 650–1600)
IgM, Serum: 40 mg/dL — ABNORMAL LOW (ref 41–251)
Total Protein, Serum Electrophoresis: 6 g/dL (ref 6.0–8.3)

## 2013-06-04 LAB — CBC WITH DIFFERENTIAL/PLATELET
BASO%: 0.2 % (ref 0.0–2.0)
Basophils Absolute: 0 10*3/uL (ref 0.0–0.1)
EOS%: 0.3 % (ref 0.0–7.0)
Eosinophils Absolute: 0 10*3/uL (ref 0.0–0.5)
HCT: 33.1 % — ABNORMAL LOW (ref 38.4–49.9)
HGB: 11.4 g/dL — ABNORMAL LOW (ref 13.0–17.1)
LYMPH%: 7.2 % — ABNORMAL LOW (ref 14.0–49.0)
MCH: 33.9 pg — ABNORMAL HIGH (ref 27.2–33.4)
MCHC: 34.4 g/dL (ref 32.0–36.0)
MCV: 98.7 fL — ABNORMAL HIGH (ref 79.3–98.0)
MONO#: 0.2 10*3/uL (ref 0.1–0.9)
MONO%: 2.4 % (ref 0.0–14.0)
NEUT#: 9 10*3/uL — ABNORMAL HIGH (ref 1.5–6.5)
NEUT%: 89.9 % — ABNORMAL HIGH (ref 39.0–75.0)
Platelets: 155 10*3/uL (ref 140–400)
RBC: 3.35 10*6/uL — ABNORMAL LOW (ref 4.20–5.82)
RDW: 13.7 % (ref 11.0–14.6)
WBC: 10 10*3/uL (ref 4.0–10.3)
lymph#: 0.7 10*3/uL — ABNORMAL LOW (ref 0.9–3.3)

## 2013-06-04 MED ORDER — BORTEZOMIB CHEMO SQ INJECTION 3.5 MG (2.5MG/ML)
1.3000 mg/m2 | Freq: Once | INTRAMUSCULAR | Status: AC
  Administered 2013-06-04: 2.5 mg via SUBCUTANEOUS
  Filled 2013-06-04: qty 2.5

## 2013-06-04 MED ORDER — ONDANSETRON HCL 8 MG PO TABS
8.0000 mg | ORAL_TABLET | Freq: Once | ORAL | Status: AC
  Administered 2013-06-04: 8 mg via ORAL

## 2013-06-04 NOTE — Patient Instructions (Addendum)
[facility] Discharge Instructions for Patients Receiving Chemotherapy  Today you received the following chemotherapy agents: Velcade.  To help prevent nausea and vomiting after your treatment, we encourage you to take your nausea medication as prescribed.   If you develop nausea and vomiting that is not controlled by your nausea medication, call the clinic.   BELOW ARE SYMPTOMS THAT SHOULD BE REPORTED IMMEDIATELY:  *FEVER GREATER THAN 100.5 F  *CHILLS WITH OR WITHOUT FEVER  NAUSEA AND VOMITING THAT IS NOT CONTROLLED WITH YOUR NAUSEA MEDICATION  *UNUSUAL SHORTNESS OF BREATH  *UNUSUAL BRUISING OR BLEEDING  TENDERNESS IN MOUTH AND THROAT WITH OR WITHOUT PRESENCE OF ULCERS  *URINARY PROBLEMS  *BOWEL PROBLEMS  UNUSUAL RASH Items with * indicate a potential emergency and should be followed up as soon as possible.  Feel free to call the clinic you have any questions or concerns. The clinic phone number is [phone].    

## 2013-06-07 ENCOUNTER — Ambulatory Visit (HOSPITAL_BASED_OUTPATIENT_CLINIC_OR_DEPARTMENT_OTHER): Payer: Medicare Other

## 2013-06-07 VITALS — BP 113/68 | HR 79 | Temp 97.7°F | Resp 18

## 2013-06-07 DIAGNOSIS — C9000 Multiple myeloma not having achieved remission: Secondary | ICD-10-CM

## 2013-06-07 DIAGNOSIS — Z5112 Encounter for antineoplastic immunotherapy: Secondary | ICD-10-CM

## 2013-06-07 MED ORDER — BORTEZOMIB CHEMO SQ INJECTION 3.5 MG (2.5MG/ML)
1.3000 mg/m2 | Freq: Once | INTRAMUSCULAR | Status: AC
  Administered 2013-06-07: 2.5 mg via SUBCUTANEOUS
  Filled 2013-06-07: qty 2.5

## 2013-06-07 MED ORDER — ONDANSETRON HCL 8 MG PO TABS
8.0000 mg | ORAL_TABLET | Freq: Once | ORAL | Status: AC
  Administered 2013-06-07: 8 mg via ORAL

## 2013-06-07 NOTE — Patient Instructions (Addendum)
[facility] Discharge Instructions for Patients Receiving Chemotherapy  Today you received the following chemotherapy agents: Velcade.  To help prevent nausea and vomiting after your treatment, we encourage you to take your nausea medication as prescribed.   If you develop nausea and vomiting that is not controlled by your nausea medication, call the clinic.   BELOW ARE SYMPTOMS THAT SHOULD BE REPORTED IMMEDIATELY:  *FEVER GREATER THAN 100.5 F  *CHILLS WITH OR WITHOUT FEVER  NAUSEA AND VOMITING THAT IS NOT CONTROLLED WITH YOUR NAUSEA MEDICATION  *UNUSUAL SHORTNESS OF BREATH  *UNUSUAL BRUISING OR BLEEDING  TENDERNESS IN MOUTH AND THROAT WITH OR WITHOUT PRESENCE OF ULCERS  *URINARY PROBLEMS  *BOWEL PROBLEMS  UNUSUAL RASH Items with * indicate a potential emergency and should be followed up as soon as possible.  Feel free to call the clinic you have any questions or concerns. The clinic phone number is [phone].    

## 2013-06-14 ENCOUNTER — Other Ambulatory Visit: Payer: Self-pay | Admitting: Oncology

## 2013-06-14 DIAGNOSIS — C9000 Multiple myeloma not having achieved remission: Secondary | ICD-10-CM

## 2013-06-18 ENCOUNTER — Other Ambulatory Visit (HOSPITAL_BASED_OUTPATIENT_CLINIC_OR_DEPARTMENT_OTHER): Payer: Medicare Other | Admitting: Lab

## 2013-06-18 ENCOUNTER — Ambulatory Visit (HOSPITAL_BASED_OUTPATIENT_CLINIC_OR_DEPARTMENT_OTHER): Payer: Medicare Other

## 2013-06-18 VITALS — BP 112/57 | HR 95 | Temp 98.2°F

## 2013-06-18 DIAGNOSIS — Z5112 Encounter for antineoplastic immunotherapy: Secondary | ICD-10-CM

## 2013-06-18 DIAGNOSIS — C9000 Multiple myeloma not having achieved remission: Secondary | ICD-10-CM

## 2013-06-18 LAB — CBC WITH DIFFERENTIAL/PLATELET
BASO%: 0.1 % (ref 0.0–2.0)
Basophils Absolute: 0 10*3/uL (ref 0.0–0.1)
EOS%: 0 % (ref 0.0–7.0)
Eosinophils Absolute: 0 10*3/uL (ref 0.0–0.5)
HCT: 30.9 % — ABNORMAL LOW (ref 38.4–49.9)
HGB: 10.3 g/dL — ABNORMAL LOW (ref 13.0–17.1)
LYMPH%: 4.4 % — ABNORMAL LOW (ref 14.0–49.0)
MCH: 32.7 pg (ref 27.2–33.4)
MCHC: 33.3 g/dL (ref 32.0–36.0)
MCV: 98.1 fL — ABNORMAL HIGH (ref 79.3–98.0)
MONO#: 0.1 10*3/uL (ref 0.1–0.9)
MONO%: 0.6 % (ref 0.0–14.0)
NEUT#: 7.3 10*3/uL — ABNORMAL HIGH (ref 1.5–6.5)
NEUT%: 94.9 % — ABNORMAL HIGH (ref 39.0–75.0)
Platelets: 152 10*3/uL (ref 140–400)
RBC: 3.15 10*6/uL — ABNORMAL LOW (ref 4.20–5.82)
RDW: 13.6 % (ref 11.0–14.6)
WBC: 7.7 10*3/uL (ref 4.0–10.3)
lymph#: 0.3 10*3/uL — ABNORMAL LOW (ref 0.9–3.3)

## 2013-06-18 LAB — BASIC METABOLIC PANEL (CC13)
BUN: 25.7 mg/dL (ref 7.0–26.0)
CO2: 20 mEq/L — ABNORMAL LOW (ref 22–29)
Calcium: 9.5 mg/dL (ref 8.4–10.4)
Chloride: 111 mEq/L — ABNORMAL HIGH (ref 98–109)
Creatinine: 1.3 mg/dL (ref 0.7–1.3)
Glucose: 180 mg/dl — ABNORMAL HIGH (ref 70–140)
Potassium: 4.5 mEq/L (ref 3.5–5.1)
Sodium: 141 mEq/L (ref 136–145)

## 2013-06-18 MED ORDER — ONDANSETRON HCL 8 MG PO TABS
8.0000 mg | ORAL_TABLET | Freq: Once | ORAL | Status: AC
  Administered 2013-06-18: 8 mg via ORAL

## 2013-06-18 MED ORDER — BORTEZOMIB CHEMO SQ INJECTION 3.5 MG (2.5MG/ML)
1.3000 mg/m2 | Freq: Once | INTRAMUSCULAR | Status: AC
  Administered 2013-06-18: 2.5 mg via SUBCUTANEOUS
  Filled 2013-06-18: qty 2.5

## 2013-06-18 NOTE — Patient Instructions (Signed)
[facility] Discharge Instructions for Patients Receiving Chemotherapy  Today you received the following chemotherapy agents: velcade  To help prevent nausea and vomiting after your treatment, we encourage you to take your nausea medication.  Take it as often as prescribed.     If you develop nausea and vomiting that is not controlled by your nausea medication, call the clinic. If it is after clinic hours your family physician or the after hours number for the clinic or go to the Emergency Department.   BELOW ARE SYMPTOMS THAT SHOULD BE REPORTED IMMEDIATELY:  *FEVER GREATER THAN 100.5 F  *CHILLS WITH OR WITHOUT FEVER  NAUSEA AND VOMITING THAT IS NOT CONTROLLED WITH YOUR NAUSEA MEDICATION  *UNUSUAL SHORTNESS OF BREATH  *UNUSUAL BRUISING OR BLEEDING  TENDERNESS IN MOUTH AND THROAT WITH OR WITHOUT PRESENCE OF ULCERS  *URINARY PROBLEMS  *BOWEL PROBLEMS  UNUSUAL RASH Items with * indicate a potential emergency and should be followed up as soon as possible.  Feel free to call the clinic you have any questions or concerns. The clinic phone number is [phone].   I have been informed and understand all the instructions given to me. I know to contact the clinic, my physician, or go to the Emergency Department if any problems should occur. I do not have any questions at this time, but understand that I may call the clinic during office hours   should I have any questions or need assistance in obtaining follow up care.    __________________________________________  _____________  __________ Signature of Patient or Authorized Representative            Date                   Time    __________________________________________ Nurse's Signature    

## 2013-06-20 LAB — PROTEIN ELECTROPHORESIS, SERUM
Albumin ELP: 58.3 % (ref 55.8–66.1)
Alpha-1-Globulin: 5.7 % — ABNORMAL HIGH (ref 2.9–4.9)
Alpha-2-Globulin: 14 % — ABNORMAL HIGH (ref 7.1–11.8)
Beta 2: 4.1 % (ref 3.2–6.5)
Beta Globulin: 6.8 % (ref 4.7–7.2)
Gamma Globulin: 11.1 % (ref 11.1–18.8)
Total Protein, Serum Electrophoresis: 5.9 g/dL — ABNORMAL LOW (ref 6.0–8.3)

## 2013-06-20 LAB — KAPPA/LAMBDA LIGHT CHAINS
Kappa free light chain: 0.98 mg/dL (ref 0.33–1.94)
Kappa:Lambda Ratio: 0.1 — ABNORMAL LOW (ref 0.26–1.65)
Lambda Free Lght Chn: 9.91 mg/dL — ABNORMAL HIGH (ref 0.57–2.63)

## 2013-06-20 LAB — IGG, IGA, IGM
IgA: 37 mg/dL — ABNORMAL LOW (ref 68–379)
IgG (Immunoglobin G), Serum: 771 mg/dL (ref 650–1600)
IgM, Serum: 39 mg/dL — ABNORMAL LOW (ref 41–251)

## 2013-06-21 ENCOUNTER — Ambulatory Visit (HOSPITAL_BASED_OUTPATIENT_CLINIC_OR_DEPARTMENT_OTHER): Payer: Medicare Other

## 2013-06-21 VITALS — BP 117/64 | HR 81 | Temp 97.8°F

## 2013-06-21 DIAGNOSIS — C9000 Multiple myeloma not having achieved remission: Secondary | ICD-10-CM

## 2013-06-21 DIAGNOSIS — Z5112 Encounter for antineoplastic immunotherapy: Secondary | ICD-10-CM

## 2013-06-21 MED ORDER — BORTEZOMIB CHEMO SQ INJECTION 3.5 MG (2.5MG/ML)
1.3000 mg/m2 | Freq: Once | INTRAMUSCULAR | Status: AC
  Administered 2013-06-21: 2.5 mg via SUBCUTANEOUS
  Filled 2013-06-21: qty 2.5

## 2013-06-21 MED ORDER — ONDANSETRON HCL 8 MG PO TABS
8.0000 mg | ORAL_TABLET | Freq: Once | ORAL | Status: AC
  Administered 2013-06-21: 8 mg via ORAL

## 2013-06-21 NOTE — Progress Notes (Signed)
Pt requests to change office visit 8/28 to later in the day. Has appt at [facility] that day. Request to schedulers.

## 2013-06-24 ENCOUNTER — Other Ambulatory Visit: Payer: Self-pay | Admitting: Oncology

## 2013-06-25 ENCOUNTER — Ambulatory Visit (HOSPITAL_BASED_OUTPATIENT_CLINIC_OR_DEPARTMENT_OTHER): Payer: Medicare Other

## 2013-06-25 ENCOUNTER — Other Ambulatory Visit (HOSPITAL_BASED_OUTPATIENT_CLINIC_OR_DEPARTMENT_OTHER): Payer: Medicare Other | Admitting: Lab

## 2013-06-25 ENCOUNTER — Ambulatory Visit (HOSPITAL_BASED_OUTPATIENT_CLINIC_OR_DEPARTMENT_OTHER): Payer: Medicare Other | Admitting: Oncology

## 2013-06-25 VITALS — BP 110/60 | HR 76 | Temp 97.8°F | Resp 18 | Ht 73.0 in | Wt 187.3 lb

## 2013-06-25 DIAGNOSIS — D630 Anemia in neoplastic disease: Secondary | ICD-10-CM

## 2013-06-25 DIAGNOSIS — T451X5A Adverse effect of antineoplastic and immunosuppressive drugs, initial encounter: Secondary | ICD-10-CM

## 2013-06-25 DIAGNOSIS — Z5112 Encounter for antineoplastic immunotherapy: Secondary | ICD-10-CM

## 2013-06-25 DIAGNOSIS — C9000 Multiple myeloma not having achieved remission: Secondary | ICD-10-CM

## 2013-06-25 DIAGNOSIS — R911 Solitary pulmonary nodule: Secondary | ICD-10-CM

## 2013-06-25 DIAGNOSIS — I82409 Acute embolism and thrombosis of unspecified deep veins of unspecified lower extremity: Secondary | ICD-10-CM

## 2013-06-25 LAB — CBC WITH DIFFERENTIAL/PLATELET
BASO%: 0.7 % (ref 0.0–2.0)
Basophils Absolute: 0 10*3/uL (ref 0.0–0.1)
EOS%: 4.2 % (ref 0.0–7.0)
Eosinophils Absolute: 0.2 10*3/uL (ref 0.0–0.5)
HCT: 32.2 % — ABNORMAL LOW (ref 38.4–49.9)
HGB: 10.9 g/dL — ABNORMAL LOW (ref 13.0–17.1)
LYMPH%: 22.4 % (ref 14.0–49.0)
MCH: 33.3 pg (ref 27.2–33.4)
MCHC: 33.8 g/dL (ref 32.0–36.0)
MCV: 98.4 fL — ABNORMAL HIGH (ref 79.3–98.0)
MONO#: 0.6 10*3/uL (ref 0.1–0.9)
MONO%: 11.1 % (ref 0.0–14.0)
NEUT#: 3.3 10*3/uL (ref 1.5–6.5)
NEUT%: 61.6 % (ref 39.0–75.0)
Platelets: 142 10*3/uL (ref 140–400)
RBC: 3.27 10*6/uL — ABNORMAL LOW (ref 4.20–5.82)
RDW: 13.4 % (ref 11.0–14.6)
WBC: 5.4 10*3/uL (ref 4.0–10.3)
lymph#: 1.2 10*3/uL (ref 0.9–3.3)

## 2013-06-25 MED ORDER — BORTEZOMIB CHEMO SQ INJECTION 3.5 MG (2.5MG/ML)
1.3000 mg/m2 | Freq: Once | INTRAMUSCULAR | Status: AC
  Administered 2013-06-25: 2.5 mg via SUBCUTANEOUS
  Filled 2013-06-25: qty 2.5

## 2013-06-25 MED ORDER — ONDANSETRON HCL 8 MG PO TABS
8.0000 mg | ORAL_TABLET | Freq: Once | ORAL | Status: AC
  Administered 2013-06-25: 8 mg via ORAL

## 2013-06-25 NOTE — Progress Notes (Signed)
[facility]    OFFICE PROGRESS NOTE   INTERVAL HISTORY:   He returns as scheduled. He began a second cycle of Velcade/Decadron on 06/10/2013. He reports an increased energy level and appetite. Stable tingling/numbness in the fingertips. He is been active working at home.  He was evaluated at [facility] last week and reports being diagnosed with a right leg DVT.  Objective:  Vital signs in last 24 hours:  Blood pressure 110/60, pulse 76, temperature 97.8 F (36.6 C), temperature source Oral, resp. rate 18, height 6\' 1"  (1.854 m), weight 187 lb 4.8 oz (84.959 kg).    HEENT: macroglossia, no thrush or ulcers Resp: lungs clear bilaterally Cardio: regular rate and rhythm GI: no hepatosplenomegaly, nontender Vascular: pitting edema at the right greater than left lower leg and ankle Neuro:the motor exam appears intact in the upper and lower extremities      Lab Results:  Lab Results  Component Value Date   WBC 5.4 06/25/2013   HGB 10.9* 06/25/2013   HCT 32.2* 06/25/2013   MCV 98.4* 06/25/2013   PLT 142 06/25/2013  ANC 3.3  06/18/2013-lambda free light chains 9.91, M spike not detected  BUN 25.7, creatinine 1.3     Medications: I have reviewed the patient's current medications.  Assessment/Plan: 1. 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 2. "Bruising" at the eyelids and mouth: Likely related to amyloidosis, persistent. 3. Numbness and loss of vibratory sense at the fingertips: This predated Velcade-based therapy, but worsened. Now much improved following cervical spine surgery. 4. Elevated serum free lambda light chains. The lambda light chains were lower on November 16 and slightly higher on 12/14/2011. 5. Lambda light chain proteinuria. 6. Bone marrow plasmacytosis: Variable increase in plasma cells noted on the bone marrow biopsy 07/29/2011 with plasma cells estimated to represent between 4% and 20% of the cellular population. 7. Remote history of prostate  cancer. 8. Sleep apnea. 9. Dyslipidemia. 10. Report of pneumonia on 2 occasions in 2011. 11. Admission to a hospital in [place], [place] October 2012 with "pneumonia." A chest x-ray at [facility] on November 2 was negative .  12. Low serum immunoglobulin G level. 13. Plasma cell dyscrasia with associated amyloidosis.  a. Initiation of systemic therapy with Cytoxan, Velcade, and Decadron 08/12/2011. Cycle #2 was initiated on 09/16/2011. Cycle #3 was initiated on 10/15/2011. Velcade was placed on hold due to neuropathy. b. The serum free lambda light chains were decreased on October 08 2011. c. The serum free lambda light chains were slightly increased on 12/14/2011, lower on 12/29/2011 and 02/02/2012. d. Initiation of Revlimid/Decadron February 2013, cycle 2 started on 01/28/2012. e. High-dose melphalan chemotherapy followed by autologous stem cell infusion on 02/09/2013 f. Initiation of Velcade/Decadrontherapy 05/28/2013 15. Loss of "balance "and proximal motor weakness secondary to cervical stenosis-status post decompression surgery on 02/19/2012. Improved, but not resolved stenosis with mass effect on the spinal cord noted on a repeat MRI 03/28/2012. He underwent further decompression surgery on 04/06/2012. The ataxia, weakness, and peripheral numbness is much improved.  16. Right lower Extremity deep vein thrombosis 02/02/2012-a Doppler ultrasound confirmed a gastrocnemius and peroneal deep vein thrombosis. An IVC filter was placed prior to surgery and he was maintained on Lovenox. The IVC filter was removed on 05/04/2012.  17. Anorexia following cervical spine surgery. Improved.  18. Episode of flank pain and hematuria in 03/31/2012-etiology unclear. He completed a course of antibiotics. The hematuria and pain have resolved.  19. Rectal wall thickening noted on a CT  of the pelvis 03/31/2012.  20. colonoscopy showed an area of abnormality at the rectum with biopsy positive for amyloid.    21. Lung nodule noted on the CT 03/31/2012  22. Lumbar stenosis-status post surgery on 07/31/2012.  23. Status post stem cell collection.  24.Rright internal jugular thrombosis, 10/22/2012, likely related to pheresis catheter and HIT  25. Admission with acute right pulmonary embolism 10/27/2012  26. Superior mesenteric vein/portal vein thrombosis diagnosed on a CT of the abdomen 10/28/2012 and 10/29/2012-treated with Argatroban , maintained on Coumadin anticoagulation until he hospital admission March 2014 for stem cell therapy  27. HIT confirmed on a serotonin release assay 10/29/2012  28. History ofRenal insufficiency during the December 2013 hospital admission-likely related to dehydration and? Contrast nephropathy  29. Dysphagia-? Secondary to amyloidosis, reported when he was here on 05/08/2013. He did not complain of dysphagia today  30. Anemia secondary to multiple myeloma and chemotherapy  31.right lower  Extremity deep vein thrombosis diagnosed at [facility] July 2014  Disposition:  He is completing a second cycle of salvage Velcade/Decadron therapy. He is scheduled for a vacation during the week of 07/09/2013. He will return to begin a third cycle of Velcade/Decadron on 07/16/2013. He is scheduled for an office visit on 07/19/2013  Brent Allison has followup visits at [facility] during the week of 07/16/2013.   Brent Papas, MD  06/25/2013  8:32 PM

## 2013-06-25 NOTE — Patient Instructions (Signed)
[facility] Discharge Instructions for Patients Receiving Chemotherapy  Today you received the following chemotherapy agents: velcade  To help prevent nausea and vomiting after your treatment, we encourage you to take your nausea medication.  Take it as often as prescribed.     If you develop nausea and vomiting that is not controlled by your nausea medication, call the clinic. If it is after clinic hours your family physician or the after hours number for the clinic or go to the Emergency Department.   BELOW ARE SYMPTOMS THAT SHOULD BE REPORTED IMMEDIATELY:  *FEVER GREATER THAN 100.5 F  *CHILLS WITH OR WITHOUT FEVER  NAUSEA AND VOMITING THAT IS NOT CONTROLLED WITH YOUR NAUSEA MEDICATION  *UNUSUAL SHORTNESS OF BREATH  *UNUSUAL BRUISING OR BLEEDING  TENDERNESS IN MOUTH AND THROAT WITH OR WITHOUT PRESENCE OF ULCERS  *URINARY PROBLEMS  *BOWEL PROBLEMS  UNUSUAL RASH Items with * indicate a potential emergency and should be followed up as soon as possible.  Feel free to call the clinic you have any questions or concerns. The clinic phone number is [phone].   I have been informed and understand all the instructions given to me. I know to contact the clinic, my physician, or go to the Emergency Department if any problems should occur. I do not have any questions at this time, but understand that I may call the clinic during office hours   should I have any questions or need assistance in obtaining follow up care.    __________________________________________  _____________  __________ Signature of Patient or Authorized Representative            Date                   Time    __________________________________________ Nurse's Signature    

## 2013-06-26 ENCOUNTER — Other Ambulatory Visit: Payer: Self-pay | Admitting: Certified Registered Nurse Anesthetist

## 2013-06-27 ENCOUNTER — Telehealth: Payer: Self-pay | Admitting: Oncology

## 2013-06-27 NOTE — Telephone Encounter (Signed)
, °

## 2013-06-28 ENCOUNTER — Ambulatory Visit (HOSPITAL_BASED_OUTPATIENT_CLINIC_OR_DEPARTMENT_OTHER): Payer: Medicare Other

## 2013-06-28 VITALS — BP 122/66 | HR 82 | Temp 98.4°F | Resp 20

## 2013-06-28 DIAGNOSIS — C9000 Multiple myeloma not having achieved remission: Secondary | ICD-10-CM

## 2013-06-28 DIAGNOSIS — Z5112 Encounter for antineoplastic immunotherapy: Secondary | ICD-10-CM

## 2013-06-28 MED ORDER — BORTEZOMIB CHEMO SQ INJECTION 3.5 MG (2.5MG/ML)
1.3000 mg/m2 | Freq: Once | INTRAMUSCULAR | Status: AC
  Administered 2013-06-28: 2.5 mg via SUBCUTANEOUS
  Filled 2013-06-28: qty 2.5

## 2013-06-28 MED ORDER — ONDANSETRON HCL 8 MG PO TABS
8.0000 mg | ORAL_TABLET | Freq: Once | ORAL | Status: AC
  Administered 2013-06-28: 8 mg via ORAL

## 2013-06-28 NOTE — Patient Instructions (Addendum)
[facility] Discharge Instructions for Patients Receiving Chemotherapy  Today you received the following chemotherapy agents: Velcade.  To help prevent nausea and vomiting after your treatment, we encourage you to take your nausea medication as prescribed.   If you develop nausea and vomiting that is not controlled by your nausea medication, call the clinic.   BELOW ARE SYMPTOMS THAT SHOULD BE REPORTED IMMEDIATELY:  *FEVER GREATER THAN 100.5 F  *CHILLS WITH OR WITHOUT FEVER  NAUSEA AND VOMITING THAT IS NOT CONTROLLED WITH YOUR NAUSEA MEDICATION  *UNUSUAL SHORTNESS OF BREATH  *UNUSUAL BRUISING OR BLEEDING  TENDERNESS IN MOUTH AND THROAT WITH OR WITHOUT PRESENCE OF ULCERS  *URINARY PROBLEMS  *BOWEL PROBLEMS  UNUSUAL RASH Items with * indicate a potential emergency and should be followed up as soon as possible.  Feel free to call the clinic you have any questions or concerns. The clinic phone number is [phone].    

## 2013-07-15 ENCOUNTER — Other Ambulatory Visit: Payer: Self-pay | Admitting: Oncology

## 2013-07-16 ENCOUNTER — Other Ambulatory Visit (HOSPITAL_BASED_OUTPATIENT_CLINIC_OR_DEPARTMENT_OTHER): Payer: Medicare Other | Admitting: Lab

## 2013-07-16 ENCOUNTER — Ambulatory Visit (HOSPITAL_BASED_OUTPATIENT_CLINIC_OR_DEPARTMENT_OTHER): Payer: Medicare Other

## 2013-07-16 VITALS — BP 118/70 | HR 91 | Temp 97.9°F

## 2013-07-16 DIAGNOSIS — C9000 Multiple myeloma not having achieved remission: Secondary | ICD-10-CM

## 2013-07-16 DIAGNOSIS — Z5112 Encounter for antineoplastic immunotherapy: Secondary | ICD-10-CM

## 2013-07-16 LAB — CBC WITH DIFFERENTIAL/PLATELET
BASO%: 0.3 % (ref 0.0–2.0)
Basophils Absolute: 0 10*3/uL (ref 0.0–0.1)
EOS%: 0.1 % (ref 0.0–7.0)
Eosinophils Absolute: 0 10*3/uL (ref 0.0–0.5)
HCT: 32.5 % — ABNORMAL LOW (ref 38.4–49.9)
HGB: 11.1 g/dL — ABNORMAL LOW (ref 13.0–17.1)
LYMPH%: 5.8 % — ABNORMAL LOW (ref 14.0–49.0)
MCH: 33.1 pg (ref 27.2–33.4)
MCHC: 34 g/dL (ref 32.0–36.0)
MCV: 97.3 fL (ref 79.3–98.0)
MONO#: 0.2 10*3/uL (ref 0.1–0.9)
MONO%: 2.7 % (ref 0.0–14.0)
NEUT#: 6.6 10*3/uL — ABNORMAL HIGH (ref 1.5–6.5)
NEUT%: 91.1 % — ABNORMAL HIGH (ref 39.0–75.0)
Platelets: 177 10*3/uL (ref 140–400)
RBC: 3.34 10*6/uL — ABNORMAL LOW (ref 4.20–5.82)
RDW: 14 % (ref 11.0–14.6)
WBC: 7.2 10*3/uL (ref 4.0–10.3)
lymph#: 0.4 10*3/uL — ABNORMAL LOW (ref 0.9–3.3)

## 2013-07-16 LAB — BASIC METABOLIC PANEL (CC13)
BUN: 23 mg/dL (ref 7.0–26.0)
CO2: 21 mEq/L — ABNORMAL LOW (ref 22–29)
Calcium: 9.5 mg/dL (ref 8.4–10.4)
Chloride: 108 mEq/L (ref 98–109)
Creatinine: 1.3 mg/dL (ref 0.7–1.3)
Glucose: 186 mg/dl — ABNORMAL HIGH (ref 70–140)
Potassium: 4.5 mEq/L (ref 3.5–5.1)
Sodium: 141 mEq/L (ref 136–145)

## 2013-07-16 MED ORDER — ONDANSETRON HCL 8 MG PO TABS
8.0000 mg | ORAL_TABLET | Freq: Once | ORAL | Status: AC
  Administered 2013-07-16: 8 mg via ORAL

## 2013-07-16 MED ORDER — BORTEZOMIB CHEMO SQ INJECTION 3.5 MG (2.5MG/ML)
1.3000 mg/m2 | Freq: Once | INTRAMUSCULAR | Status: AC
  Administered 2013-07-16: 2.5 mg via SUBCUTANEOUS
  Filled 2013-07-16: qty 2.5

## 2013-07-18 LAB — SPEP & IFE WITH QIG
Albumin ELP: 56.1 % (ref 55.8–66.1)
Alpha-1-Globulin: 7 % — ABNORMAL HIGH (ref 2.9–4.9)
Alpha-2-Globulin: 16.7 % — ABNORMAL HIGH (ref 7.1–11.8)
Beta 2: 4.7 % (ref 3.2–6.5)
Beta Globulin: 6.8 % (ref 4.7–7.2)
Gamma Globulin: 8.7 % — ABNORMAL LOW (ref 11.1–18.8)
IgA: 32 mg/dL — ABNORMAL LOW (ref 68–379)
IgG (Immunoglobin G), Serum: 589 mg/dL — ABNORMAL LOW (ref 650–1600)
IgM, Serum: 45 mg/dL (ref 41–251)
Total Protein, Serum Electrophoresis: 6.2 g/dL (ref 6.0–8.3)

## 2013-07-18 LAB — KAPPA/LAMBDA LIGHT CHAINS
Kappa free light chain: 0.96 mg/dL (ref 0.33–1.94)
Kappa:Lambda Ratio: 0.13 — ABNORMAL LOW (ref 0.26–1.65)
Lambda Free Lght Chn: 7.31 mg/dL — ABNORMAL HIGH (ref 0.57–2.63)

## 2013-07-19 ENCOUNTER — Ambulatory Visit: Payer: Medicare Other

## 2013-07-19 ENCOUNTER — Telehealth: Payer: Self-pay | Admitting: Oncology

## 2013-07-19 ENCOUNTER — Ambulatory Visit (HOSPITAL_BASED_OUTPATIENT_CLINIC_OR_DEPARTMENT_OTHER): Payer: Medicare Other | Admitting: Oncology

## 2013-07-19 ENCOUNTER — Other Ambulatory Visit: Payer: Medicare Other | Admitting: Lab

## 2013-07-19 VITALS — BP 118/63 | HR 90 | Temp 97.1°F | Resp 19 | Ht 73.0 in | Wt 188.0 lb

## 2013-07-19 DIAGNOSIS — C9000 Multiple myeloma not having achieved remission: Secondary | ICD-10-CM

## 2013-07-19 MED ORDER — TRAMADOL HCL 50 MG PO TABS: 50.0000 mg | ORAL_TABLET | Freq: Four times a day (QID) | ORAL | Status: DC | PRN

## 2013-07-19 NOTE — Progress Notes (Signed)
[facility]    OFFICE PROGRESS NOTE   INTERVAL HISTORY:   He returns as scheduled. He began a third cycle of Velcade/Decadron on 07/16/2013. He has tolerated the Velcade well. No neuropathy symptoms. No nausea. He continues to have bilateral leg edema.  He was seen at [facility] yesterday and again today. Dr. Marissa Calamity recommend switching to a weekly Velcade/Decadron schedule. A repeat Doppler at [facility] today revealed chronic thrombus in the right Gastrocnemius. He continues anticoagulation.  He fell off of a lawnmower recently and developed an ecchymosis at the left arm.  Objective:  Vital signs in last 24 hours:  Blood pressure 118/63, pulse 90, temperature 97.1 F (36.2 C), temperature source Oral, resp. rate 19, height 6\' 1"  (1.854 m), weight 188 lb (85.276 kg).    HEENT: No thrush, macroglossia Resp: Lungs clear bilaterally Cardio: Regular rate and rhythm GI: No hepatosplenomegaly, nontender Vascular: 1+ pitting edema at the low leg and ankle bilaterally  Skin: Resolving ecchymosis at the left upper arm, ecchymoses around the eyes     Lab Results:  Lab Results  Component Value Date   WBC 7.2 07/16/2013   HGB 11.1* 07/16/2013   HCT 32.5* 07/16/2013   MCV 97.3 07/16/2013   PLT 177 07/16/2013   ANC 6.6  Serum free lambda light chains on 07/16/2013-7.31   Medications: I have reviewed the patient's current medications.  Assessment/Plan: 1. 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 2. "Bruising" at the eyelids and mouth: Likely related to amyloidosis, persistent. 3. Numbness and loss of vibratory sense at the fingertips: This predated Velcade-based therapy, but worsened. Now much improved following cervical spine surgery. 4. Elevated serum free lambda light chains. The lambda light chains were lower on November 16 and slightly higher on 12/14/2011. 5. Lambda light chain proteinuria. 6. Bone marrow plasmacytosis: Variable increase in plasma cells noted on  the bone marrow biopsy 07/29/2011 with plasma cells estimated to represent between 4% and 20% of the cellular population. 7. Remote history of prostate cancer. 8. Sleep apnea. 9. Dyslipidemia. 10. Report of pneumonia on 2 occasions in 2011. 11. Admission to a hospital in [place], [place] October 2012 with "pneumonia." A chest x-ray at [facility] on November 2 was negative .  12. Low serum immunoglobulin G level. 13. Plasma cell dyscrasia with associated amyloidosis.  a. Initiation of systemic therapy with Cytoxan, Velcade, and Decadron 08/12/2011. Cycle #2 was initiated on 09/16/2011. Cycle #3 was initiated on 10/15/2011. Velcade was placed on hold due to neuropathy. b. The serum free lambda light chains were decreased on October 08 2011. c. The serum free lambda light chains were slightly increased on 12/14/2011, lower on 12/29/2011 and 02/02/2012. d. Initiation of Revlimid/Decadron February 2013, cycle 2 started on 01/28/2012. e. High-dose melphalan chemotherapy followed by autologous stem cell infusion on 02/09/2013 f. Initiation of Velcade/Decadrontherapy 05/28/2013, he completed 2 cycles on a day 1, 4, 8, 11 schedule g. Improvement in the serum free lambda light chains following induction Velcade/Decadron there h. Initiation of weekly Velcade/Decadron on 07/16/2013 15. Loss of "balance "and proximal motor weakness secondary to cervical stenosis-status post decompression surgery on 02/19/2012. Improved, but not resolved stenosis with mass effect on the spinal cord noted on a repeat MRI 03/28/2012. He underwent further decompression surgery on 04/06/2012. The ataxia, weakness, and peripheral numbness is much improved.  16. Right lower Extremity deep vein thrombosis 02/02/2012-a Doppler ultrasound confirmed a gastrocnemius and peroneal deep vein thrombosis. An IVC filter was placed prior to surgery and he  was maintained on Lovenox. The IVC filter was removed on 05/04/2012.  17. Anorexia  following cervical spine surgery. Improved.  18. Episode of flank pain and hematuria in 03/31/2012-etiology unclear. He completed a course of antibiotics. The hematuria and pain have resolved.  19. Rectal wall thickening noted on a CT of the pelvis 03/31/2012.  20. colonoscopy showed an area of abnormality at the rectum with biopsy positive for amyloid.  21. Lung nodule noted on the CT 03/31/2012  22. Lumbar stenosis-status post surgery on 07/31/2012.  23. Status post stem cell collection.  24.Rright internal jugular thrombosis, 10/22/2012, likely related to pheresis catheter and HIT  25. Admission with acute right pulmonary embolism 10/27/2012  26. Superior mesenteric vein/portal vein thrombosis diagnosed on a CT of the abdomen 10/28/2012 and 10/29/2012-treated with Argatroban , maintained on Coumadin anticoagulation until he hospital admission March 2014 for stem cell therapy  27. HIT confirmed on a serotonin release assay 10/29/2012  28. History ofRenal insufficiency during the December 2013 hospital admission-likely related to dehydration and? Contrast nephropathy  29. Dysphagia-? Secondary to amyloidosis, reported when he was here on 05/08/2013. He did not complain of dysphagia today  30. Anemia secondary to multiple myeloma and chemotherapy  31.right lower Extremity deep vein thrombosis diagnosed at [facility] July 2014 , repeat Doppler at [facility] 07/19/2013 with chronic thrombus in the right gastrocnemius, maintained on Eliquis      Disposition:  He appears stable. He appears to have achieved a partial remission with the melphalan and induction Velcade/Decadron. We will continue Velcade/Decadron on a weekly schedule. He will return for an office visit and repeat serum light chains on 08/21/2013.  We will schedule a noncontrast chest CT to followup on the left lung nodule noted on the CT in May of 2013.     [name], MD  07/19/2013  5:12 PM

## 2013-07-19 NOTE — Telephone Encounter (Signed)
gv pt appt schedule for September. Weekly tx dates listed on pof do not reflect twice per week dates listed in care plan. Per pt tx is now once per week. Pt given schedule according to dates listed on pof and messsage sent to [facility] to confirm and also to confirm if 9/16 is to be moved to 9/23 instead on 9/30 as there is a lab order dated for 9/30 but not 9/23 which is the other tx day.

## 2013-07-20 ENCOUNTER — Telehealth: Payer: Self-pay | Admitting: *Deleted

## 2013-07-20 NOTE — Telephone Encounter (Signed)
Made patient aware that CT chest was ordered to follow a lung nodule that was seen 1 year ago. Likely benign, but he wants to be sure. Patient understands and agrees.

## 2013-07-24 ENCOUNTER — Other Ambulatory Visit (HOSPITAL_BASED_OUTPATIENT_CLINIC_OR_DEPARTMENT_OTHER): Payer: Medicare Other | Admitting: Lab

## 2013-07-24 ENCOUNTER — Ambulatory Visit (HOSPITAL_BASED_OUTPATIENT_CLINIC_OR_DEPARTMENT_OTHER): Payer: Medicare Other

## 2013-07-24 VITALS — BP 118/69 | HR 88 | Temp 97.5°F | Resp 20

## 2013-07-24 DIAGNOSIS — C9000 Multiple myeloma not having achieved remission: Secondary | ICD-10-CM

## 2013-07-24 DIAGNOSIS — Z5112 Encounter for antineoplastic immunotherapy: Secondary | ICD-10-CM

## 2013-07-24 DIAGNOSIS — E8581 Light chain (AL) amyloidosis: Secondary | ICD-10-CM

## 2013-07-24 LAB — CBC WITH DIFFERENTIAL/PLATELET
BASO%: 0.5 % (ref 0.0–2.0)
Basophils Absolute: 0 10*3/uL (ref 0.0–0.1)
EOS%: 3.5 % (ref 0.0–7.0)
Eosinophils Absolute: 0.3 10*3/uL (ref 0.0–0.5)
HCT: 31.2 % — ABNORMAL LOW (ref 38.4–49.9)
HGB: 10.2 g/dL — ABNORMAL LOW (ref 13.0–17.1)
LYMPH%: 14.8 % (ref 14.0–49.0)
MCH: 31 pg (ref 27.2–33.4)
MCHC: 32.7 g/dL (ref 32.0–36.0)
MCV: 94.8 fL (ref 79.3–98.0)
MONO#: 1.5 10*3/uL — ABNORMAL HIGH (ref 0.1–0.9)
MONO%: 17.3 % — ABNORMAL HIGH (ref 0.0–14.0)
NEUT#: 5.5 10*3/uL (ref 1.5–6.5)
NEUT%: 63.9 % (ref 39.0–75.0)
Platelets: 174 10*3/uL (ref 140–400)
RBC: 3.29 10*6/uL — ABNORMAL LOW (ref 4.20–5.82)
RDW: 13.3 % (ref 11.0–14.6)
WBC: 8.7 10*3/uL (ref 4.0–10.3)
lymph#: 1.3 10*3/uL (ref 0.9–3.3)

## 2013-07-24 MED ORDER — ONDANSETRON HCL 8 MG PO TABS
8.0000 mg | ORAL_TABLET | Freq: Once | ORAL | Status: AC
  Administered 2013-07-24: 8 mg via ORAL

## 2013-07-24 MED ORDER — BORTEZOMIB CHEMO SQ INJECTION 3.5 MG (2.5MG/ML)
2.5000 mg | Freq: Once | INTRAMUSCULAR | Status: AC
  Administered 2013-07-24: 2.5 mg via SUBCUTANEOUS
  Filled 2013-07-24: qty 2.5

## 2013-07-24 NOTE — Patient Instructions (Signed)
[facility] Discharge Instructions for Patients Receiving Chemotherapy  Today you received the following chemotherapy agents: Velcade.  To help prevent nausea and vomiting after your treatment, we encourage you to take your nausea medication as prescribed.   If you develop nausea and vomiting that is not controlled by your nausea medication, call the clinic.   BELOW ARE SYMPTOMS THAT SHOULD BE REPORTED IMMEDIATELY:  *FEVER GREATER THAN 100.5 F  *CHILLS WITH OR WITHOUT FEVER  NAUSEA AND VOMITING THAT IS NOT CONTROLLED WITH YOUR NAUSEA MEDICATION  *UNUSUAL SHORTNESS OF BREATH  *UNUSUAL BRUISING OR BLEEDING  TENDERNESS IN MOUTH AND THROAT WITH OR WITHOUT PRESENCE OF ULCERS  *URINARY PROBLEMS  *BOWEL PROBLEMS  UNUSUAL RASH Items with * indicate a potential emergency and should be followed up as soon as possible.  Feel free to call the clinic you have any questions or concerns. The clinic phone number is [phone].    

## 2013-07-25 ENCOUNTER — Telehealth: Payer: Self-pay | Admitting: *Deleted

## 2013-07-25 NOTE — Telephone Encounter (Signed)
Message from pt reporting he decreased his Eliquis  to once daily. Has noticed it is taking longer for bruises to heal. Pt also left message for Dr. Sheryn Bison at [facility]. Made Dr. Truett Perna aware, he recommends pt clear this with MD at [facility]. Returned call to pt, he voiced understanding.

## 2013-07-26 ENCOUNTER — Ambulatory Visit: Payer: Medicare Other

## 2013-07-29 ENCOUNTER — Other Ambulatory Visit: Payer: Self-pay | Admitting: Oncology

## 2013-07-30 ENCOUNTER — Ambulatory Visit (HOSPITAL_COMMUNITY)
Admission: RE | Admit: 2013-07-30 | Discharge: 2013-07-30 | Disposition: A | Discharge status: Home / Self Care | Payer: Medicare Other | Source: Ambulatory Visit | Attending: Oncology | Admitting: Oncology

## 2013-07-30 ENCOUNTER — Other Ambulatory Visit: Payer: Self-pay | Admitting: Oncology

## 2013-07-30 DIAGNOSIS — J988 Other specified respiratory disorders: Secondary | ICD-10-CM | POA: Insufficient documentation

## 2013-07-30 DIAGNOSIS — C9000 Multiple myeloma not having achieved remission: Secondary | ICD-10-CM

## 2013-07-30 DIAGNOSIS — I700 Atherosclerosis of aorta: Secondary | ICD-10-CM | POA: Insufficient documentation

## 2013-07-30 DIAGNOSIS — I2510 Atherosclerotic heart disease of native coronary artery without angina pectoris: Secondary | ICD-10-CM | POA: Insufficient documentation

## 2013-07-30 DIAGNOSIS — E859 Amyloidosis, unspecified: Secondary | ICD-10-CM | POA: Insufficient documentation

## 2013-07-30 DIAGNOSIS — K8020 Calculus of gallbladder without cholecystitis without obstruction: Secondary | ICD-10-CM | POA: Insufficient documentation

## 2013-07-30 DIAGNOSIS — J90 Pleural effusion, not elsewhere classified: Secondary | ICD-10-CM | POA: Insufficient documentation

## 2013-07-31 ENCOUNTER — Other Ambulatory Visit (HOSPITAL_BASED_OUTPATIENT_CLINIC_OR_DEPARTMENT_OTHER): Payer: Medicare Other

## 2013-07-31 ENCOUNTER — Ambulatory Visit (HOSPITAL_BASED_OUTPATIENT_CLINIC_OR_DEPARTMENT_OTHER): Payer: Medicare Other

## 2013-07-31 VITALS — BP 121/71 | HR 94 | Temp 97.2°F

## 2013-07-31 DIAGNOSIS — C9000 Multiple myeloma not having achieved remission: Secondary | ICD-10-CM

## 2013-07-31 DIAGNOSIS — Z5112 Encounter for antineoplastic immunotherapy: Secondary | ICD-10-CM

## 2013-07-31 DIAGNOSIS — E8581 Light chain (AL) amyloidosis: Secondary | ICD-10-CM

## 2013-07-31 LAB — CBC WITH DIFFERENTIAL/PLATELET
BASO%: 0.5 % (ref 0.0–2.0)
Basophils Absolute: 0.1 10*3/uL (ref 0.0–0.1)
EOS%: 0.4 % (ref 0.0–7.0)
Eosinophils Absolute: 0 10*3/uL (ref 0.0–0.5)
HCT: 32.1 % — ABNORMAL LOW (ref 38.4–49.9)
HGB: 10.8 g/dL — ABNORMAL LOW (ref 13.0–17.1)
LYMPH%: 4.4 % — ABNORMAL LOW (ref 14.0–49.0)
MCH: 32.2 pg (ref 27.2–33.4)
MCHC: 33.6 g/dL (ref 32.0–36.0)
MCV: 95.8 fL (ref 79.3–98.0)
MONO#: 0.2 10*3/uL (ref 0.1–0.9)
MONO%: 2.1 % (ref 0.0–14.0)
NEUT#: 10.5 10*3/uL — ABNORMAL HIGH (ref 1.5–6.5)
NEUT%: 92.6 % — ABNORMAL HIGH (ref 39.0–75.0)
Platelets: 177 10*3/uL (ref 140–400)
RBC: 3.35 10*6/uL — ABNORMAL LOW (ref 4.20–5.82)
RDW: 14.3 % (ref 11.0–14.6)
WBC: 11.4 10*3/uL — ABNORMAL HIGH (ref 4.0–10.3)
lymph#: 0.5 10*3/uL — ABNORMAL LOW (ref 0.9–3.3)

## 2013-07-31 MED ORDER — ONDANSETRON HCL 8 MG PO TABS
8.0000 mg | ORAL_TABLET | Freq: Once | ORAL | Status: AC
  Administered 2013-07-31: 8 mg via ORAL

## 2013-07-31 MED ORDER — ONDANSETRON HCL 8 MG PO TABS
ORAL_TABLET | ORAL | Status: AC
  Administered 2013-07-31: 8 mg via ORAL
  Filled 2013-07-31: qty 1

## 2013-07-31 MED ORDER — BORTEZOMIB CHEMO SQ INJECTION 3.5 MG (2.5MG/ML)
1.3000 mg/m2 | Freq: Once | INTRAMUSCULAR | Status: AC
Start: 2013-07-31 — End: 2013-07-31
  Administered 2013-07-31: 2.75 mg via SUBCUTANEOUS
  Filled 2013-07-31: qty 2.75

## 2013-08-01 ENCOUNTER — Telehealth: Payer: Self-pay | Admitting: *Deleted

## 2013-08-01 NOTE — Telephone Encounter (Signed)
Message copied by Wilkie Aye Savannaha Stonerock P on Wed Aug 01, 2013  3:25 PM ------      Message from: [name] B      Created: Mon Jul 30, 2013  9:52 PM       Please call patient, no lung nodule on CT ------

## 2013-08-01 NOTE — Telephone Encounter (Signed)
Notified pt that CT showed no lung nodule.  Pt verbalized understanding and confirmed appt for 08/21/13.

## 2013-08-03 ENCOUNTER — Other Ambulatory Visit: Payer: Self-pay | Admitting: Certified Registered Nurse Anesthetist

## 2013-08-12 ENCOUNTER — Other Ambulatory Visit: Payer: Self-pay | Admitting: Oncology

## 2013-08-13 ENCOUNTER — Other Ambulatory Visit: Payer: Self-pay | Admitting: Orthopedic Surgery

## 2013-08-13 DIAGNOSIS — M25512 Pain in left shoulder: Secondary | ICD-10-CM

## 2013-08-14 ENCOUNTER — Other Ambulatory Visit (HOSPITAL_BASED_OUTPATIENT_CLINIC_OR_DEPARTMENT_OTHER): Payer: Medicare Other | Admitting: Lab

## 2013-08-14 ENCOUNTER — Ambulatory Visit (HOSPITAL_BASED_OUTPATIENT_CLINIC_OR_DEPARTMENT_OTHER): Payer: Medicare Other

## 2013-08-14 VITALS — BP 127/72 | HR 98 | Temp 98.3°F

## 2013-08-14 DIAGNOSIS — E8581 Light chain (AL) amyloidosis: Secondary | ICD-10-CM

## 2013-08-14 DIAGNOSIS — Z5112 Encounter for antineoplastic immunotherapy: Secondary | ICD-10-CM

## 2013-08-14 DIAGNOSIS — C9000 Multiple myeloma not having achieved remission: Secondary | ICD-10-CM

## 2013-08-14 LAB — CBC WITH DIFFERENTIAL/PLATELET
BASO%: 0.4 % (ref 0.0–2.0)
Basophils Absolute: 0 10*3/uL (ref 0.0–0.1)
EOS%: 0.3 % (ref 0.0–7.0)
Eosinophils Absolute: 0 10*3/uL (ref 0.0–0.5)
HCT: 33.3 % — ABNORMAL LOW (ref 38.4–49.9)
HGB: 11 g/dL — ABNORMAL LOW (ref 13.0–17.1)
LYMPH%: 5 % — ABNORMAL LOW (ref 14.0–49.0)
MCH: 31.2 pg (ref 27.2–33.4)
MCHC: 33 g/dL (ref 32.0–36.0)
MCV: 94.4 fL (ref 79.3–98.0)
MONO#: 0.1 10*3/uL (ref 0.1–0.9)
MONO%: 1.4 % (ref 0.0–14.0)
NEUT#: 8.8 10*3/uL — ABNORMAL HIGH (ref 1.5–6.5)
NEUT%: 92.9 % — ABNORMAL HIGH (ref 39.0–75.0)
Platelets: 202 10*3/uL (ref 140–400)
RBC: 3.53 10*6/uL — ABNORMAL LOW (ref 4.20–5.82)
RDW: 14.5 % (ref 11.0–14.6)
WBC: 9.5 10*3/uL (ref 4.0–10.3)
lymph#: 0.5 10*3/uL — ABNORMAL LOW (ref 0.9–3.3)

## 2013-08-14 MED ORDER — BORTEZOMIB CHEMO SQ INJECTION 3.5 MG (2.5MG/ML)
1.3000 mg/m2 | Freq: Once | INTRAMUSCULAR | Status: AC
  Administered 2013-08-14: 2.75 mg via SUBCUTANEOUS
  Filled 2013-08-14: qty 2.75

## 2013-08-14 MED ORDER — ONDANSETRON HCL 8 MG PO TABS
8.0000 mg | ORAL_TABLET | Freq: Once | ORAL | Status: AC
  Administered 2013-08-14: 8 mg via ORAL

## 2013-08-14 MED ORDER — ONDANSETRON HCL 8 MG PO TABS
ORAL_TABLET | ORAL | Status: AC
  Filled 2013-08-14: qty 1

## 2013-08-14 NOTE — Patient Instructions (Signed)
[facility] Discharge Instructions for Patients Receiving Chemotherapy  Today you received the following chemotherapy agents: Velcade.  To help prevent nausea and vomiting after your treatment, we encourage you to take your nausea medication as prescribed.   If you develop nausea and vomiting that is not controlled by your nausea medication, call the clinic.   BELOW ARE SYMPTOMS THAT SHOULD BE REPORTED IMMEDIATELY:  *FEVER GREATER THAN 100.5 F  *CHILLS WITH OR WITHOUT FEVER  NAUSEA AND VOMITING THAT IS NOT CONTROLLED WITH YOUR NAUSEA MEDICATION  *UNUSUAL SHORTNESS OF BREATH  *UNUSUAL BRUISING OR BLEEDING  TENDERNESS IN MOUTH AND THROAT WITH OR WITHOUT PRESENCE OF ULCERS  *URINARY PROBLEMS  *BOWEL PROBLEMS  UNUSUAL RASH Items with * indicate a potential emergency and should be followed up as soon as possible.  Feel free to call the clinic you have any questions or concerns. The clinic phone number is [phone].    

## 2013-08-19 ENCOUNTER — Other Ambulatory Visit: Payer: Self-pay | Admitting: Oncology

## 2013-08-20 ENCOUNTER — Ambulatory Visit
Admission: RE | Admit: 2013-08-20 | Discharge: 2013-08-20 | Disposition: A | Discharge status: Home / Self Care | Payer: Medicare Other | Source: Ambulatory Visit | Attending: Orthopedic Surgery | Admitting: Orthopedic Surgery

## 2013-08-20 DIAGNOSIS — M25512 Pain in left shoulder: Secondary | ICD-10-CM

## 2013-08-21 ENCOUNTER — Other Ambulatory Visit: Payer: Medicare Other | Admitting: Lab

## 2013-08-21 ENCOUNTER — Telehealth: Payer: Self-pay | Admitting: *Deleted

## 2013-08-21 ENCOUNTER — Ambulatory Visit (HOSPITAL_BASED_OUTPATIENT_CLINIC_OR_DEPARTMENT_OTHER): Payer: Medicare Other | Admitting: Oncology

## 2013-08-21 ENCOUNTER — Ambulatory Visit (HOSPITAL_BASED_OUTPATIENT_CLINIC_OR_DEPARTMENT_OTHER): Payer: Medicare Other

## 2013-08-21 ENCOUNTER — Telehealth: Payer: Self-pay | Admitting: Oncology

## 2013-08-21 VITALS — BP 110/63 | HR 88 | Temp 97.0°F | Resp 19 | Ht 73.0 in | Wt 187.4 lb

## 2013-08-21 DIAGNOSIS — C9000 Multiple myeloma not having achieved remission: Secondary | ICD-10-CM

## 2013-08-21 DIAGNOSIS — Z23 Encounter for immunization: Secondary | ICD-10-CM

## 2013-08-21 DIAGNOSIS — I82409 Acute embolism and thrombosis of unspecified deep veins of unspecified lower extremity: Secondary | ICD-10-CM

## 2013-08-21 DIAGNOSIS — D6481 Anemia due to antineoplastic chemotherapy: Secondary | ICD-10-CM

## 2013-08-21 DIAGNOSIS — T451X5A Adverse effect of antineoplastic and immunosuppressive drugs, initial encounter: Secondary | ICD-10-CM

## 2013-08-21 DIAGNOSIS — Z5112 Encounter for antineoplastic immunotherapy: Secondary | ICD-10-CM

## 2013-08-21 DIAGNOSIS — E8581 Light chain (AL) amyloidosis: Secondary | ICD-10-CM

## 2013-08-21 LAB — CBC WITH DIFFERENTIAL/PLATELET
BASO%: 0.7 % (ref 0.0–2.0)
Basophils Absolute: 0.1 10*3/uL (ref 0.0–0.1)
EOS%: 5.1 % (ref 0.0–7.0)
Eosinophils Absolute: 0.5 10*3/uL (ref 0.0–0.5)
HCT: 32.6 % — ABNORMAL LOW (ref 38.4–49.9)
HGB: 11 g/dL — ABNORMAL LOW (ref 13.0–17.1)
LYMPH%: 8 % — ABNORMAL LOW (ref 14.0–49.0)
MCH: 31.7 pg (ref 27.2–33.4)
MCHC: 33.9 g/dL (ref 32.0–36.0)
MCV: 93.4 fL (ref 79.3–98.0)
MONO#: 0.7 10*3/uL (ref 0.1–0.9)
MONO%: 7.5 % (ref 0.0–14.0)
NEUT#: 7.1 10*3/uL — ABNORMAL HIGH (ref 1.5–6.5)
NEUT%: 78.7 % — ABNORMAL HIGH (ref 39.0–75.0)
Platelets: 155 10*3/uL (ref 140–400)
RBC: 3.48 10*6/uL — ABNORMAL LOW (ref 4.20–5.82)
RDW: 14.4 % (ref 11.0–14.6)
WBC: 9.1 10*3/uL (ref 4.0–10.3)
lymph#: 0.7 10*3/uL — ABNORMAL LOW (ref 0.9–3.3)

## 2013-08-21 LAB — BASIC METABOLIC PANEL (CC13)
BUN: 19.5 mg/dL (ref 7.0–26.0)
CO2: 22 mEq/L (ref 22–29)
Calcium: 9 mg/dL (ref 8.4–10.4)
Chloride: 109 mEq/L (ref 98–109)
Creatinine: 1.2 mg/dL (ref 0.7–1.3)
Glucose: 120 mg/dl (ref 70–140)
Potassium: 4 mEq/L (ref 3.5–5.1)
Sodium: 140 mEq/L (ref 136–145)

## 2013-08-21 MED ORDER — BORTEZOMIB CHEMO SQ INJECTION 3.5 MG (2.5MG/ML)
1.3000 mg/m2 | Freq: Once | INTRAMUSCULAR | Status: AC
  Administered 2013-08-21: 2.75 mg via SUBCUTANEOUS
  Filled 2013-08-21: qty 2.75

## 2013-08-21 MED ORDER — ONDANSETRON HCL 8 MG PO TABS
8.0000 mg | ORAL_TABLET | Freq: Once | ORAL | Status: AC
  Administered 2013-08-21: 8 mg via ORAL

## 2013-08-21 MED ORDER — ONDANSETRON HCL 8 MG PO TABS
ORAL_TABLET | ORAL | Status: AC
  Filled 2013-08-21: qty 1

## 2013-08-21 MED ORDER — INFLUENZA VAC SPLIT QUAD 0.5 ML IM SUSP
0.5000 mL | Freq: Once | INTRAMUSCULAR | Status: AC
  Administered 2013-08-21: 0.5 mL via INTRAMUSCULAR
  Filled 2013-08-21: qty 0.5

## 2013-08-21 NOTE — Patient Instructions (Addendum)
[facility] Discharge Instructions for Patients Receiving Chemotherapy  Today you received the following chemotherapy agents: Velcade. To help prevent nausea and vomiting after your treatment, we encourage you to take your nausea medication.   If you develop nausea and vomiting that is not controlled by your nausea medication, call the clinic.   BELOW ARE SYMPTOMS THAT SHOULD BE REPORTED IMMEDIATELY:  *FEVER GREATER THAN 100.5 F  *CHILLS WITH OR WITHOUT FEVER  NAUSEA AND VOMITING THAT IS NOT CONTROLLED WITH YOUR NAUSEA MEDICATION  *UNUSUAL SHORTNESS OF BREATH  *UNUSUAL BRUISING OR BLEEDING  TENDERNESS IN MOUTH AND THROAT WITH OR WITHOUT PRESENCE OF ULCERS  *URINARY PROBLEMS  *BOWEL PROBLEMS  UNUSUAL RASH Items with * indicate a potential emergency and should be followed up as soon as possible.  You also received the Flu vaccine.  Please refer to the information sheet you received.    Feel free to call the clinic you have any questions or concerns. The clinic phone number is [phone].

## 2013-08-21 NOTE — Telephone Encounter (Signed)
Per staff message and POF I have scheduled appts.  JMW  

## 2013-08-21 NOTE — Telephone Encounter (Signed)
Message from pt reporting conflicting appts 10/28. Has visit with Dr. Marissa Calamity that day. Per Dr.Sherrill: OK to move Velcade injection one day and keep 11/4 appts as scheduled. Order sent to schedulers to adjust appts.

## 2013-08-21 NOTE — Telephone Encounter (Signed)
gv and printed appt sched and avs forpt for OCT and NOV....MW added tx °

## 2013-08-21 NOTE — Progress Notes (Signed)
[facility]    OFFICE PROGRESS NOTE   INTERVAL HISTORY:   He returns as scheduled. He continues Velcade/Decadron therapy. No nausea/vomiting or diarrhea. He reports constipation. Good energy level. He has been evaluated by orthopedics after the left arm trauma. He reports being diagnosed with a left rotator cuff tear that will require surgery. He continues anticoagulation therapy. There is edema of the right greater than left lower leg and ankle. The edema improves at night. No bleeding.  Objective:  Vital signs in last 24 hours:  Blood pressure 110/63, pulse 88, temperature 97 F (36.1 C), temperature source Oral, resp. rate 19, height 6\' 1"  (1.854 m), weight 187 lb 6.4 oz (85.004 kg).    HEENT: Ecchymoses at the anterior buccal mucosa and subungual areas. No thrush or ulcers. Resp: Lungs clear bilaterally Cardio: Regular rate and rhythm GI: No hepatosplenomegaly, nontender Vascular: Pitting edema at the right lower leg and ankle, trace edema at the left ankle  Skin: Small ecchymoses surrounding eyes   Lab Results:  Lab Results  Component Value Date   WBC 9.1 08/21/2013   HGB 11.0* 08/21/2013   HCT 32.6* 08/21/2013   MCV 93.4 08/21/2013   PLT 155 08/21/2013   ANC 7.1  Lambda free light chains on 07/16/2013-7.31  Medications: I have reviewed the patient's current medications.  Assessment/Plan: 1. 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 2. "Bruising" at the eyelids and mouth: Likely related to amyloidosis, persistent. 3. Numbness and loss of vibratory sense at the fingertips: This predated Velcade-based therapy, but worsened. Now much improved following cervical spine surgery. 4. Elevated serum free lambda light chains. The lambda light chains were lower on November 16 and slightly higher on 12/14/2011. 5. Lambda light chain proteinuria. 6. Bone marrow plasmacytosis: Variable increase in plasma cells noted on the bone marrow biopsy 07/29/2011 with  plasma cells estimated to represent between 4% and 20% of the cellular population. 7. Remote history of prostate cancer. 8. Sleep apnea. 9. Dyslipidemia. 10. Report of pneumonia on 2 occasions in 2011. 11. Admission to a hospital in [place], [place] October 2012 with "pneumonia." A chest x-ray at [facility] on November 2 was negative .  12. Low serum immunoglobulin G level. 13. Plasma cell dyscrasia with associated amyloidosis.  a. Initiation of systemic therapy with Cytoxan, Velcade, and Decadron 08/12/2011. Cycle #2 was initiated on 09/16/2011. Cycle #3 was initiated on 10/15/2011. Velcade was placed on hold due to neuropathy. b. The serum free lambda light chains were decreased on October 08 2011. c. The serum free lambda light chains were slightly increased on 12/14/2011, lower on 12/29/2011 and 02/02/2012. d. Initiation of Revlimid/Decadron February 2013, cycle 2 started on 01/28/2012. e. High-dose melphalan chemotherapy followed by autologous stem cell infusion on 02/09/2013 f. Initiation of Velcade/Decadrontherapy 05/28/2013, he completed 2 cycles on a day 1, 4, 8, 11 schedule g. Improvement in the serum free lambda light chains following induction Velcade/Decadron  h. Initiation of weekly Velcade/Decadron on 07/16/2013 15. Loss of "balance "and proximal motor weakness secondary to cervical stenosis-status post decompression surgery on 02/19/2012. Improved, but not resolved stenosis with mass effect on the spinal cord noted on a repeat MRI 03/28/2012. He underwent further decompression surgery on 04/06/2012. The ataxia, weakness, and peripheral numbness is much improved.  16. Right lower Extremity deep vein thrombosis 02/02/2012-a Doppler ultrasound confirmed a gastrocnemius and peroneal deep vein thrombosis. An IVC filter was placed prior to surgery and he was maintained on Lovenox. The IVC filter was removed  on 05/04/2012.  17. Anorexia following cervical spine surgery. Improved.    18. Episode of flank pain and hematuria in 03/31/2012-etiology unclear. He completed a course of antibiotics. The hematuria and pain have resolved.  19. Rectal wall thickening noted on a CT of the pelvis 03/31/2012.  20. colonoscopy showed an area of abnormality at the rectum with biopsy positive for amyloid.  21. Lung nodule noted on the CT 03/31/2012 , no lung nodule on a chest CT 07/30/2013 22. Lumbar stenosis-status post surgery on 07/31/2012.  23. Status post stem cell collection.  24.Rright internal jugular thrombosis, 10/22/2012, likely related to pheresis catheter and HIT  25. Admission with acute right pulmonary embolism 10/27/2012  26. Superior mesenteric vein/portal vein thrombosis diagnosed on a CT of the abdomen 10/28/2012 and 10/29/2012-treated with Argatroban , maintained on Coumadin anticoagulation until he hospital admission March 2014 for stem cell therapy  27. HIT confirmed on a serotonin release assay 10/29/2012  28. History ofRenal insufficiency during the December 2013 hospital admission-likely related to dehydration and? Contrast nephropathy  29. Dysphagia-? Secondary to amyloidosis, reported when he was here on 05/08/2013. He did not complain of dysphagia today  30. Anemia secondary to multiple myeloma and chemotherapy , stable 31.right lower Extremity deep vein thrombosis diagnosed at [facility] July 2014 , repeat Doppler at [facility] 07/19/2013 with chronic thrombus in the right gastrocnemius, maintained on Eliquis  32. Fall August 2014 with a left shoulder injury, diagnosed with a rotator cuff tear   Disposition:  He appears stable. The plan is to continue Velcade/Decadron on a weekly schedule. He will be treated today. He will begin Velcade/Decadron on a day 1, 8, 15, and 22 schedule beginning 09/04/2013.  We will followup on the serum free light chains today. He will receive an influenza vaccine today. Mr. Laser will return for an office visit in one month.   [name], MD  08/21/2013  11:10 AM

## 2013-08-22 ENCOUNTER — Telehealth: Payer: Self-pay | Admitting: Oncology

## 2013-08-22 ENCOUNTER — Telehealth: Payer: Self-pay | Admitting: *Deleted

## 2013-08-22 LAB — KAPPA/LAMBDA LIGHT CHAINS
Kappa free light chain: 0.77 mg/dL (ref 0.33–1.94)
Kappa:Lambda Ratio: 0.2 — ABNORMAL LOW (ref 0.26–1.65)
Lambda Free Lght Chn: 3.76 mg/dL — ABNORMAL HIGH (ref 0.57–2.63)

## 2013-08-22 NOTE — Telephone Encounter (Signed)
s.w. pt and advised on 10.28 being moved to 10.29.14.Marland KitchenMarland Kitchenpt ok and aware

## 2013-08-22 NOTE — Telephone Encounter (Signed)
Per staff message and POF I have scheduled appts.  JMW  

## 2013-08-24 ENCOUNTER — Encounter: Payer: Self-pay | Admitting: *Deleted

## 2013-08-27 ENCOUNTER — Telehealth: Payer: Self-pay | Admitting: *Deleted

## 2013-08-27 NOTE — Telephone Encounter (Signed)
Message copied by Caleb Popp on Mon Aug 27, 2013  2:10 PM ------      Message from: [name] B      Created: Wed Aug 22, 2013  9:39 PM       Please call patient, lambda light chains are better ------

## 2013-08-27 NOTE — Telephone Encounter (Signed)
Called pt with lab results. He voiced understanding.

## 2013-09-02 ENCOUNTER — Other Ambulatory Visit: Payer: Self-pay | Admitting: Oncology

## 2013-09-04 ENCOUNTER — Ambulatory Visit (HOSPITAL_BASED_OUTPATIENT_CLINIC_OR_DEPARTMENT_OTHER): Payer: Medicare Other

## 2013-09-04 ENCOUNTER — Other Ambulatory Visit (HOSPITAL_BASED_OUTPATIENT_CLINIC_OR_DEPARTMENT_OTHER): Payer: Medicare Other | Admitting: Lab

## 2013-09-04 VITALS — BP 123/74 | HR 90 | Temp 98.3°F

## 2013-09-04 DIAGNOSIS — C9000 Multiple myeloma not having achieved remission: Secondary | ICD-10-CM

## 2013-09-04 DIAGNOSIS — E8581 Light chain (AL) amyloidosis: Secondary | ICD-10-CM

## 2013-09-04 DIAGNOSIS — Z5112 Encounter for antineoplastic immunotherapy: Secondary | ICD-10-CM

## 2013-09-04 LAB — CBC WITH DIFFERENTIAL/PLATELET
BASO%: 0.7 % (ref 0.0–2.0)
Basophils Absolute: 0.1 10*3/uL (ref 0.0–0.1)
EOS%: 1.5 % (ref 0.0–7.0)
Eosinophils Absolute: 0.2 10*3/uL (ref 0.0–0.5)
HCT: 32.6 % — ABNORMAL LOW (ref 38.4–49.9)
HGB: 10.9 g/dL — ABNORMAL LOW (ref 13.0–17.1)
LYMPH%: 6.6 % — ABNORMAL LOW (ref 14.0–49.0)
MCH: 31.2 pg (ref 27.2–33.4)
MCHC: 33.5 g/dL (ref 32.0–36.0)
MCV: 93.1 fL (ref 79.3–98.0)
MONO#: 0.5 10*3/uL (ref 0.1–0.9)
MONO%: 4.2 % (ref 0.0–14.0)
NEUT#: 9.5 10*3/uL — ABNORMAL HIGH (ref 1.5–6.5)
NEUT%: 87 % — ABNORMAL HIGH (ref 39.0–75.0)
Platelets: 166 10*3/uL (ref 140–400)
RBC: 3.51 10*6/uL — ABNORMAL LOW (ref 4.20–5.82)
RDW: 15.2 % — ABNORMAL HIGH (ref 11.0–14.6)
WBC: 10.9 10*3/uL — ABNORMAL HIGH (ref 4.0–10.3)
lymph#: 0.7 10*3/uL — ABNORMAL LOW (ref 0.9–3.3)

## 2013-09-04 MED ORDER — BORTEZOMIB CHEMO SQ INJECTION 3.5 MG (2.5MG/ML)
1.3000 mg/m2 | Freq: Once | INTRAMUSCULAR | Status: AC
  Administered 2013-09-04: 2.75 mg via SUBCUTANEOUS
  Filled 2013-09-04: qty 2.75

## 2013-09-04 MED ORDER — ONDANSETRON HCL 8 MG PO TABS
8.0000 mg | ORAL_TABLET | Freq: Once | ORAL | Status: AC
  Administered 2013-09-04: 8 mg via ORAL

## 2013-09-04 MED ORDER — ONDANSETRON HCL 8 MG PO TABS
ORAL_TABLET | ORAL | Status: AC
  Filled 2013-09-04: qty 1

## 2013-09-04 NOTE — Patient Instructions (Signed)
[facility] Discharge Instructions for Patients Receiving Chemotherapy  Today you received the following chemotherapy agents: Velcade. To help prevent nausea and vomiting after your treatment, we encourage you to take your nausea medication.  If you develop nausea and vomiting that is not controlled by your nausea medication, call the clinic.   BELOW ARE SYMPTOMS THAT SHOULD BE REPORTED IMMEDIATELY:  *FEVER GREATER THAN 100.5 F  *CHILLS WITH OR WITHOUT FEVER  NAUSEA AND VOMITING THAT IS NOT CONTROLLED WITH YOUR NAUSEA MEDICATION  *UNUSUAL SHORTNESS OF BREATH  *UNUSUAL BRUISING OR BLEEDING  TENDERNESS IN MOUTH AND THROAT WITH OR WITHOUT PRESENCE OF ULCERS  *URINARY PROBLEMS  *BOWEL PROBLEMS  UNUSUAL RASH Items with * indicate a potential emergency and should be followed up as soon as possible.  Feel free to call the clinic you have any questions or concerns. The clinic phone number is [phone].    

## 2013-09-05 ENCOUNTER — Telehealth: Payer: Self-pay | Admitting: Oncology

## 2013-09-05 ENCOUNTER — Telehealth: Payer: Self-pay | Admitting: *Deleted

## 2013-09-05 NOTE — Telephone Encounter (Signed)
Faxed pt medical records to [facility]

## 2013-09-05 NOTE — Telephone Encounter (Signed)
Planning for rotator cuff surgery on 10/29 and is requesting to move his 10/29 lab/velcade to 10/27 if MD approves. Message to MD.

## 2013-09-07 NOTE — Telephone Encounter (Signed)
Made patient aware that Dr. Truett Perna feels it would be best to hold treatment the week of his surgery-cancel all together. Treat on 11/4 with office visit as scheduled. Patient understands and agrees. Sees Dr. Marissa Calamity on 10/28 (day before his surgery).

## 2013-09-09 ENCOUNTER — Other Ambulatory Visit: Payer: Self-pay | Admitting: Oncology

## 2013-09-11 ENCOUNTER — Other Ambulatory Visit (HOSPITAL_BASED_OUTPATIENT_CLINIC_OR_DEPARTMENT_OTHER): Payer: Medicare Other | Admitting: Lab

## 2013-09-11 ENCOUNTER — Ambulatory Visit (HOSPITAL_BASED_OUTPATIENT_CLINIC_OR_DEPARTMENT_OTHER): Payer: Medicare Other

## 2013-09-11 VITALS — BP 127/65 | HR 86 | Temp 98.2°F | Resp 18

## 2013-09-11 DIAGNOSIS — C9000 Multiple myeloma not having achieved remission: Secondary | ICD-10-CM

## 2013-09-11 DIAGNOSIS — Z5112 Encounter for antineoplastic immunotherapy: Secondary | ICD-10-CM

## 2013-09-11 DIAGNOSIS — E8581 Light chain (AL) amyloidosis: Secondary | ICD-10-CM

## 2013-09-11 LAB — CBC WITH DIFFERENTIAL/PLATELET
BASO%: 0.9 % (ref 0.0–2.0)
Basophils Absolute: 0.1 10*3/uL (ref 0.0–0.1)
EOS%: 5 % (ref 0.0–7.0)
Eosinophils Absolute: 0.3 10*3/uL (ref 0.0–0.5)
HCT: 32.4 % — ABNORMAL LOW (ref 38.4–49.9)
HGB: 10.8 g/dL — ABNORMAL LOW (ref 13.0–17.1)
LYMPH%: 15.2 % (ref 14.0–49.0)
MCH: 31.1 pg (ref 27.2–33.4)
MCHC: 33.4 g/dL (ref 32.0–36.0)
MCV: 93.1 fL (ref 79.3–98.0)
MONO#: 0.5 10*3/uL (ref 0.1–0.9)
MONO%: 8.5 % (ref 0.0–14.0)
NEUT#: 4.5 10*3/uL (ref 1.5–6.5)
NEUT%: 70.4 % (ref 39.0–75.0)
Platelets: 156 10*3/uL (ref 140–400)
RBC: 3.48 10*6/uL — ABNORMAL LOW (ref 4.20–5.82)
RDW: 15.7 % — ABNORMAL HIGH (ref 11.0–14.6)
WBC: 6.4 10*3/uL (ref 4.0–10.3)
lymph#: 1 10*3/uL (ref 0.9–3.3)

## 2013-09-11 MED ORDER — ONDANSETRON HCL 8 MG PO TABS
8.0000 mg | ORAL_TABLET | Freq: Once | ORAL | Status: AC
  Administered 2013-09-11: 8 mg via ORAL

## 2013-09-11 MED ORDER — ONDANSETRON HCL 8 MG PO TABS
ORAL_TABLET | ORAL | Status: AC
  Filled 2013-09-11: qty 1

## 2013-09-11 MED ORDER — BORTEZOMIB CHEMO SQ INJECTION 3.5 MG (2.5MG/ML)
1.3000 mg/m2 | Freq: Once | INTRAMUSCULAR | Status: AC
  Administered 2013-09-11: 2.75 mg via SUBCUTANEOUS
  Filled 2013-09-11: qty 2.75

## 2013-09-11 NOTE — Progress Notes (Signed)
Velcade consent signed on 05/28/13.

## 2013-09-11 NOTE — Patient Instructions (Signed)
[facility] Discharge Instructions for Patients Receiving Chemotherapy  Today you received the following chemotherapy agents: Velcade. To help prevent nausea and vomiting after your treatment, we encourage you to take your nausea medication.  If you develop nausea and vomiting that is not controlled by your nausea medication, call the clinic.   BELOW ARE SYMPTOMS THAT SHOULD BE REPORTED IMMEDIATELY:  *FEVER GREATER THAN 100.5 F  *CHILLS WITH OR WITHOUT FEVER  NAUSEA AND VOMITING THAT IS NOT CONTROLLED WITH YOUR NAUSEA MEDICATION  *UNUSUAL SHORTNESS OF BREATH  *UNUSUAL BRUISING OR BLEEDING  TENDERNESS IN MOUTH AND THROAT WITH OR WITHOUT PRESENCE OF ULCERS  *URINARY PROBLEMS  *BOWEL PROBLEMS  UNUSUAL RASH Items with * indicate a potential emergency and should be followed up as soon as possible.  Feel free to call the clinic you have any questions or concerns. The clinic phone number is [phone].    

## 2013-09-12 ENCOUNTER — Telehealth: Payer: Self-pay | Admitting: *Deleted

## 2013-09-12 NOTE — Telephone Encounter (Addendum)
Message from [place] with Dr. Madelon Lips reporting patient is having rotator cuff surgery on 10/29, requesting anticoagulant instructions before surgery. Reviewed with Dr. Truett Perna, orders received to hold D-2, D-1, and day of surgery.  Resume Eliquis day after surgery, if ok with Dr. Madelon Lips.   Left message with Tresa Endo at Dr. Candise Bowens office to return my call.    Spoke with [employer] given, she stated she would call the pt.

## 2013-09-18 ENCOUNTER — Telehealth: Payer: Self-pay | Admitting: *Deleted

## 2013-09-18 ENCOUNTER — Other Ambulatory Visit: Payer: Medicare Other | Admitting: Lab

## 2013-09-18 ENCOUNTER — Ambulatory Visit: Payer: Medicare Other

## 2013-09-18 NOTE — Telephone Encounter (Signed)
Being seen today at [facility]. Asking where patient is in his treatment cycle/day? Is aware it is being held this week for his shoulder surgery.

## 2013-09-19 ENCOUNTER — Ambulatory Visit: Payer: Medicare Other

## 2013-09-19 ENCOUNTER — Other Ambulatory Visit: Payer: Medicare Other | Admitting: Lab

## 2013-09-20 NOTE — Telephone Encounter (Signed)
Faxed MD response to Dr. Marissa Calamity office: Started Velcade/Dex on a weekly schedule 07/16/13 (7 treatments to date) missed a few due to vacation and other procedures. Had 2 cycles of D1,4,8,11 Velcade/Dex ending 06/28/13.

## 2013-09-24 ENCOUNTER — Other Ambulatory Visit: Payer: Self-pay | Admitting: Oncology

## 2013-09-25 ENCOUNTER — Ambulatory Visit (HOSPITAL_BASED_OUTPATIENT_CLINIC_OR_DEPARTMENT_OTHER): Payer: Medicare Other

## 2013-09-25 ENCOUNTER — Telehealth: Payer: Self-pay | Admitting: Oncology

## 2013-09-25 ENCOUNTER — Other Ambulatory Visit (HOSPITAL_BASED_OUTPATIENT_CLINIC_OR_DEPARTMENT_OTHER): Payer: Medicare Other | Admitting: Lab

## 2013-09-25 ENCOUNTER — Ambulatory Visit (HOSPITAL_BASED_OUTPATIENT_CLINIC_OR_DEPARTMENT_OTHER): Payer: Medicare Other | Admitting: Oncology

## 2013-09-25 VITALS — BP 116/63 | HR 86 | Temp 97.7°F | Resp 18 | Ht 73.0 in | Wt 192.6 lb

## 2013-09-25 DIAGNOSIS — C9000 Multiple myeloma not having achieved remission: Secondary | ICD-10-CM

## 2013-09-25 DIAGNOSIS — G609 Hereditary and idiopathic neuropathy, unspecified: Secondary | ICD-10-CM

## 2013-09-25 DIAGNOSIS — E859 Amyloidosis, unspecified: Secondary | ICD-10-CM

## 2013-09-25 DIAGNOSIS — T451X5A Adverse effect of antineoplastic and immunosuppressive drugs, initial encounter: Secondary | ICD-10-CM

## 2013-09-25 DIAGNOSIS — I82409 Acute embolism and thrombosis of unspecified deep veins of unspecified lower extremity: Secondary | ICD-10-CM

## 2013-09-25 DIAGNOSIS — E8581 Light chain (AL) amyloidosis: Secondary | ICD-10-CM

## 2013-09-25 DIAGNOSIS — Z5112 Encounter for antineoplastic immunotherapy: Secondary | ICD-10-CM

## 2013-09-25 DIAGNOSIS — D6481 Anemia due to antineoplastic chemotherapy: Secondary | ICD-10-CM

## 2013-09-25 LAB — BASIC METABOLIC PANEL (CC13)
Anion Gap: 11 mEq/L (ref 3–11)
BUN: 25.5 mg/dL (ref 7.0–26.0)
CO2: 23 mEq/L (ref 22–29)
Calcium: 9.3 mg/dL (ref 8.4–10.4)
Chloride: 106 mEq/L (ref 98–109)
Creatinine: 1.3 mg/dL (ref 0.7–1.3)
Glucose: 107 mg/dl (ref 70–140)
Potassium: 4 mEq/L (ref 3.5–5.1)
Sodium: 139 mEq/L (ref 136–145)

## 2013-09-25 LAB — CBC WITH DIFFERENTIAL/PLATELET
BASO%: 0.3 % (ref 0.0–2.0)
Basophils Absolute: 0 10*3/uL (ref 0.0–0.1)
EOS%: 0.4 % (ref 0.0–7.0)
Eosinophils Absolute: 0.1 10*3/uL (ref 0.0–0.5)
HCT: 32.5 % — ABNORMAL LOW (ref 38.4–49.9)
HGB: 10.6 g/dL — ABNORMAL LOW (ref 13.0–17.1)
LYMPH%: 5.3 % — ABNORMAL LOW (ref 14.0–49.0)
MCH: 30.5 pg (ref 27.2–33.4)
MCHC: 32.5 g/dL (ref 32.0–36.0)
MCV: 94 fL (ref 79.3–98.0)
MONO#: 0.4 10*3/uL (ref 0.1–0.9)
MONO%: 2.8 % (ref 0.0–14.0)
NEUT#: 11.5 10*3/uL — ABNORMAL HIGH (ref 1.5–6.5)
NEUT%: 91.2 % — ABNORMAL HIGH (ref 39.0–75.0)
Platelets: 188 10*3/uL (ref 140–400)
RBC: 3.46 10*6/uL — ABNORMAL LOW (ref 4.20–5.82)
RDW: 16.4 % — ABNORMAL HIGH (ref 11.0–14.6)
WBC: 12.6 10*3/uL — ABNORMAL HIGH (ref 4.0–10.3)
lymph#: 0.7 10*3/uL — ABNORMAL LOW (ref 0.9–3.3)

## 2013-09-25 MED ORDER — ONDANSETRON HCL 8 MG PO TABS
8.0000 mg | ORAL_TABLET | Freq: Once | ORAL | Status: AC
  Administered 2013-09-25: 8 mg via ORAL

## 2013-09-25 MED ORDER — BORTEZOMIB CHEMO SQ INJECTION 3.5 MG (2.5MG/ML)
1.3000 mg/m2 | Freq: Once | INTRAMUSCULAR | Status: AC
  Administered 2013-09-25: 2.75 mg via SUBCUTANEOUS
  Filled 2013-09-25: qty 2.75

## 2013-09-25 MED ORDER — ONDANSETRON HCL 8 MG PO TABS
ORAL_TABLET | ORAL | Status: AC
  Filled 2013-09-25: qty 1

## 2013-09-25 NOTE — Telephone Encounter (Signed)
gv and printed appt sched and avs for pt for NOV...sed added tx. °

## 2013-09-25 NOTE — Progress Notes (Signed)
[facility]    OFFICE PROGRESS NOTE   INTERVAL HISTORY:   Scheduled followup with multiple myeloma and amyloidosis. He last received Velcade on 09/11/2013. He has noted increased numbness in the fingers. This does not interfere with activity. Stable edema in the right leg.  He underwent a right rotator cuff repair on 09/19/2013. He is recovering from surgery. He developed ecchymoses surrounding both eyes following anesthesia.  He was taken off of eliquis for surgery. This has been resumed. He reports constipation following surgery.  Objective:  Vital signs in last 24 hours:  Blood pressure 116/63, pulse 86, temperature 97.7 F (36.5 C), temperature source Oral, resp. rate 18, height 6\' 1"  (1.854 m), weight 192 lb 9.6 oz (87.363 kg).    HEENT: Bilateral periorbital ecchymoses. Resp: Lungs clear bilaterally, no respiratory distress Cardio: Regular rate and rhythm GI: No hepatosplenomegaly, nontender Vascular: Pitting edema at the right lower leg and foot Neuro: Moderate decrease in vibratory sense at the fingertips bilaterally  Musculoskeletal: The left arm is in a sling   Portacath/PICC-without erythema  Lab Results:  Lab Results  Component Value Date   WBC 12.6* 09/25/2013   HGB 10.6* 09/25/2013   HCT 32.5* 09/25/2013   MCV 94.0 09/25/2013   PLT 188 09/25/2013   ANC 11.5  Serum free lambda light chains on 08/21/2013-3.76, 2.33 at [facility] on 09/18/2013    Medications: I have reviewed the patient's current medications.  Assessment/Plan: 1. 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 2. "Bruising" at the eyelids and mouth: Likely related to amyloidosis, persistent. 3. Numbness and loss of vibratory sense at the fingertips: This predated Velcade-based therapy, but worsened. Now much improved following cervical spine surgery. 4. Elevated serum free lambda light chains. The lambda light chains were lower on November 16 and slightly higher on  12/14/2011. 5. Lambda light chain proteinuria. 6. Bone marrow plasmacytosis: Variable increase in plasma cells noted on the bone marrow biopsy 07/29/2011 with plasma cells estimated to represent between 4% and 20% of the cellular population. 7. Remote history of prostate cancer. 8. Sleep apnea. 9. Dyslipidemia. 10. Report of pneumonia on 2 occasions in 2011. 11. Admission to a hospital in [place], [place] October 2012 with "pneumonia." A chest x-ray at [facility] on November 2 was negative .  12. Low serum immunoglobulin G level. 13. Plasma cell dyscrasia with associated amyloidosis.  a. Initiation of systemic therapy with Cytoxan, Velcade, and Decadron 08/12/2011. Cycle #2 was initiated on 09/16/2011. Cycle #3 was initiated on 10/15/2011. Velcade was placed on hold due to neuropathy. b. The serum free lambda light chains were decreased on October 08 2011. c. The serum free lambda light chains were slightly increased on 12/14/2011, lower on 12/29/2011 and 02/02/2012. d. Initiation of Revlimid/Decadron February 2013, cycle 2 started on 01/28/2012. e. High-dose melphalan chemotherapy followed by autologous stem cell infusion on 02/09/2013 f. Initiation of Velcade/Decadrontherapy 05/28/2013, he completed 2 cycles on a day 1, 4, 8, 11 schedule g. Improvement in the serum free lambda light chains following induction Velcade/Decadron  h. Initiation of weekly Velcade/Decadron on 07/16/2013 i. Improvement in the serum free lambda light chains on 08/21/2013 and 09/18/2013 ([facility]) 15. Loss of "balance "and proximal motor weakness secondary to cervical stenosis-status post decompression surgery on 02/19/2012. Improved, but not resolved stenosis with mass effect on the spinal cord noted on a repeat MRI 03/28/2012. He underwent further decompression surgery on 04/06/2012. The ataxia, weakness, and peripheral numbness is much improved.  16. Right lower Extremity deep  vein thrombosis 02/02/2012-a Doppler  ultrasound confirmed a gastrocnemius and peroneal deep vein thrombosis. An IVC filter was placed prior to surgery and he was maintained on Lovenox. The IVC filter was removed on 05/04/2012.  17. Anorexia following cervical spine surgery. Improved.  18. Episode of flank pain and hematuria in 03/31/2012-etiology unclear. He completed a course of antibiotics. The hematuria and pain have resolved.  19. Rectal wall thickening noted on a CT of the pelvis 03/31/2012.  20. colonoscopy showed an area of abnormality at the rectum with biopsy positive for amyloid.  21. Lung nodule noted on the CT 03/31/2012 , no lung nodule on a chest CT 07/30/2013  22. Lumbar stenosis-status post surgery on 07/31/2012.  23. Status post stem cell collection.  24.Rright internal jugular thrombosis, 10/22/2012, likely related to pheresis catheter and HIT  25. Admission with acute right pulmonary embolism 10/27/2012  26. Superior mesenteric vein/portal vein thrombosis diagnosed on a CT of the abdomen 10/28/2012 and 10/29/2012-treated with Argatroban , maintained on Coumadin anticoagulation until he hospital admission March 2014 for stem cell therapy  27. HIT confirmed on a serotonin release assay 10/29/2012  28. History ofRenal insufficiency during the December 2013 hospital admission-likely related to dehydration and? Contrast nephropathy  29. Dysphagia-? Secondary to amyloidosis, reported when he was here on 05/08/2013. He did not complain of dysphagia today  30. Anemia secondary to multiple myeloma and chemotherapy , stable  31.right lower Extremity deep vein thrombosis diagnosed at [facility] July 2014 , repeat Doppler at [facility] 07/19/2013 with chronic thrombus in the right gastrocnemius, maintained on Eliquis  32. Fall August 2014 with a left shoulder injury, diagnosed with a rotator cuff tear, status post surgical repair 09/19/2013     Disposition:  He appears stable from a hematologic standpoint. The plan is to complete 2 more  cycles of Velcade/Decadron as recommended by Dr. Marissa Calamity. He has developed peripheral neuropathy. This is potentially related to Velcade versus the underlying myeloma/amyloid. We will continue to monitor this. Mr. Armendariz will return for an office visit in 3 weeks. The myeloma appears to be entering remission.   [name], MD  09/25/2013  10:38 AM

## 2013-09-25 NOTE — Patient Instructions (Signed)

## 2013-09-26 LAB — KAPPA/LAMBDA LIGHT CHAINS
Kappa free light chain: 0.19 mg/dL — ABNORMAL LOW (ref 0.33–1.94)
Kappa:Lambda Ratio: 0.08 — ABNORMAL LOW (ref 0.26–1.65)
Lambda Free Lght Chn: 2.37 mg/dL (ref 0.57–2.63)

## 2013-09-30 ENCOUNTER — Other Ambulatory Visit: Payer: Self-pay | Admitting: Oncology

## 2013-10-02 ENCOUNTER — Other Ambulatory Visit (HOSPITAL_BASED_OUTPATIENT_CLINIC_OR_DEPARTMENT_OTHER): Payer: Medicare Other | Admitting: Lab

## 2013-10-02 ENCOUNTER — Ambulatory Visit (HOSPITAL_BASED_OUTPATIENT_CLINIC_OR_DEPARTMENT_OTHER): Payer: Medicare Other

## 2013-10-02 VITALS — BP 114/68 | HR 76 | Temp 97.9°F | Resp 18

## 2013-10-02 DIAGNOSIS — C9000 Multiple myeloma not having achieved remission: Secondary | ICD-10-CM

## 2013-10-02 DIAGNOSIS — E8581 Light chain (AL) amyloidosis: Secondary | ICD-10-CM

## 2013-10-02 DIAGNOSIS — Z5112 Encounter for antineoplastic immunotherapy: Secondary | ICD-10-CM

## 2013-10-02 LAB — CBC WITH DIFFERENTIAL/PLATELET
BASO%: 0.6 % (ref 0.0–2.0)
Basophils Absolute: 0.1 10e3/uL (ref 0.0–0.1)
EOS%: 1.9 % (ref 0.0–7.0)
Eosinophils Absolute: 0.2 10e3/uL (ref 0.0–0.5)
HCT: 31 % — ABNORMAL LOW (ref 38.4–49.9)
HGB: 10.1 g/dL — ABNORMAL LOW (ref 13.0–17.1)
LYMPH%: 8.4 % — ABNORMAL LOW (ref 14.0–49.0)
MCH: 30.8 pg (ref 27.2–33.4)
MCHC: 32.6 g/dL (ref 32.0–36.0)
MCV: 94.4 fL (ref 79.3–98.0)
MONO#: 0.5 10e3/uL (ref 0.1–0.9)
MONO%: 4.5 % (ref 0.0–14.0)
NEUT#: 9.8 10e3/uL — ABNORMAL HIGH (ref 1.5–6.5)
NEUT%: 84.6 % — ABNORMAL HIGH (ref 39.0–75.0)
Platelets: 166 10e3/uL (ref 140–400)
RBC: 3.29 10e6/uL — ABNORMAL LOW (ref 4.20–5.82)
RDW: 16.3 % — ABNORMAL HIGH (ref 11.0–14.6)
WBC: 11.5 10e3/uL — ABNORMAL HIGH (ref 4.0–10.3)
lymph#: 1 10e3/uL (ref 0.9–3.3)

## 2013-10-02 MED ORDER — BORTEZOMIB CHEMO SQ INJECTION 3.5 MG (2.5MG/ML)
1.3000 mg/m2 | Freq: Once | INTRAMUSCULAR | Status: AC
  Administered 2013-10-02: 2.75 mg via SUBCUTANEOUS
  Filled 2013-10-02: qty 2.75

## 2013-10-02 MED ORDER — ONDANSETRON HCL 8 MG PO TABS
ORAL_TABLET | ORAL | Status: AC
  Filled 2013-10-02: qty 1

## 2013-10-02 MED ORDER — ONDANSETRON HCL 8 MG PO TABS
8.0000 mg | ORAL_TABLET | Freq: Once | ORAL | Status: AC
  Administered 2013-10-02: 8 mg via ORAL

## 2013-10-07 ENCOUNTER — Other Ambulatory Visit: Payer: Self-pay | Admitting: Oncology

## 2013-10-09 ENCOUNTER — Other Ambulatory Visit (HOSPITAL_BASED_OUTPATIENT_CLINIC_OR_DEPARTMENT_OTHER): Payer: Medicare Other | Admitting: Lab

## 2013-10-09 ENCOUNTER — Ambulatory Visit (HOSPITAL_BASED_OUTPATIENT_CLINIC_OR_DEPARTMENT_OTHER): Payer: Medicare Other

## 2013-10-09 VITALS — BP 109/69 | HR 77 | Temp 97.4°F | Resp 18

## 2013-10-09 DIAGNOSIS — Z5112 Encounter for antineoplastic immunotherapy: Secondary | ICD-10-CM

## 2013-10-09 DIAGNOSIS — C9000 Multiple myeloma not having achieved remission: Secondary | ICD-10-CM

## 2013-10-09 DIAGNOSIS — E8581 Light chain (AL) amyloidosis: Secondary | ICD-10-CM

## 2013-10-09 LAB — CBC WITH DIFFERENTIAL/PLATELET
BASO%: 0.5 % (ref 0.0–2.0)
Basophils Absolute: 0.1 10*3/uL (ref 0.0–0.1)
EOS%: 1.7 % (ref 0.0–7.0)
Eosinophils Absolute: 0.2 10*3/uL (ref 0.0–0.5)
HCT: 32 % — ABNORMAL LOW (ref 38.4–49.9)
HGB: 10.7 g/dL — ABNORMAL LOW (ref 13.0–17.1)
LYMPH%: 8.3 % — ABNORMAL LOW (ref 14.0–49.0)
MCH: 31.3 pg (ref 27.2–33.4)
MCHC: 33.4 g/dL (ref 32.0–36.0)
MCV: 93.8 fL (ref 79.3–98.0)
MONO#: 0.7 10*3/uL (ref 0.1–0.9)
MONO%: 7 % (ref 0.0–14.0)
NEUT#: 8.2 10*3/uL — ABNORMAL HIGH (ref 1.5–6.5)
NEUT%: 82.5 % — ABNORMAL HIGH (ref 39.0–75.0)
Platelets: 156 10*3/uL (ref 140–400)
RBC: 3.41 10*6/uL — ABNORMAL LOW (ref 4.20–5.82)
RDW: 16.5 % — ABNORMAL HIGH (ref 11.0–14.6)
WBC: 10 10*3/uL (ref 4.0–10.3)
lymph#: 0.8 10*3/uL — ABNORMAL LOW (ref 0.9–3.3)

## 2013-10-09 MED ORDER — ONDANSETRON HCL 8 MG PO TABS
ORAL_TABLET | ORAL | Status: AC
  Filled 2013-10-09: qty 1

## 2013-10-09 MED ORDER — BORTEZOMIB CHEMO SQ INJECTION 3.5 MG (2.5MG/ML)
1.3000 mg/m2 | Freq: Once | INTRAMUSCULAR | Status: AC
  Administered 2013-10-09: 2.75 mg via SUBCUTANEOUS
  Filled 2013-10-09: qty 2.75

## 2013-10-09 MED ORDER — ONDANSETRON HCL 8 MG PO TABS
8.0000 mg | ORAL_TABLET | Freq: Once | ORAL | Status: AC
  Administered 2013-10-09: 8 mg via ORAL

## 2013-10-09 NOTE — Patient Instructions (Signed)
[facility] Discharge Instructions for Patients Receiving Chemotherapy  Today you received the following chemotherapy agents: Velcade.  To help prevent nausea and vomiting after your treatment, we encourage you to take your nausea medication as prescribed.   If you develop nausea and vomiting that is not controlled by your nausea medication, call the clinic.   BELOW ARE SYMPTOMS THAT SHOULD BE REPORTED IMMEDIATELY:  *FEVER GREATER THAN 100.5 F  *CHILLS WITH OR WITHOUT FEVER  NAUSEA AND VOMITING THAT IS NOT CONTROLLED WITH YOUR NAUSEA MEDICATION  *UNUSUAL SHORTNESS OF BREATH  *UNUSUAL BRUISING OR BLEEDING  TENDERNESS IN MOUTH AND THROAT WITH OR WITHOUT PRESENCE OF ULCERS  *URINARY PROBLEMS  *BOWEL PROBLEMS  UNUSUAL RASH Items with * indicate a potential emergency and should be followed up as soon as possible.  Feel free to call the clinic you have any questions or concerns. The clinic phone number is [phone].    

## 2013-10-14 ENCOUNTER — Other Ambulatory Visit: Payer: Self-pay | Admitting: Oncology

## 2013-10-16 ENCOUNTER — Ambulatory Visit: Payer: Medicare Other | Admitting: Lab

## 2013-10-16 ENCOUNTER — Other Ambulatory Visit (HOSPITAL_BASED_OUTPATIENT_CLINIC_OR_DEPARTMENT_OTHER): Payer: Medicare Other | Admitting: Lab

## 2013-10-16 ENCOUNTER — Ambulatory Visit (HOSPITAL_BASED_OUTPATIENT_CLINIC_OR_DEPARTMENT_OTHER): Payer: Medicare Other

## 2013-10-16 ENCOUNTER — Ambulatory Visit (HOSPITAL_BASED_OUTPATIENT_CLINIC_OR_DEPARTMENT_OTHER): Payer: Medicare Other | Admitting: Oncology

## 2013-10-16 ENCOUNTER — Telehealth: Payer: Self-pay | Admitting: Oncology

## 2013-10-16 VITALS — BP 108/57 | HR 82 | Temp 97.8°F | Resp 20 | Ht 73.0 in | Wt 194.7 lb

## 2013-10-16 DIAGNOSIS — C9000 Multiple myeloma not having achieved remission: Secondary | ICD-10-CM

## 2013-10-16 DIAGNOSIS — E8589 Other amyloidosis: Secondary | ICD-10-CM

## 2013-10-16 DIAGNOSIS — D630 Anemia in neoplastic disease: Secondary | ICD-10-CM

## 2013-10-16 DIAGNOSIS — I82509 Chronic embolism and thrombosis of unspecified deep veins of unspecified lower extremity: Secondary | ICD-10-CM

## 2013-10-16 DIAGNOSIS — G609 Hereditary and idiopathic neuropathy, unspecified: Secondary | ICD-10-CM

## 2013-10-16 DIAGNOSIS — D6481 Anemia due to antineoplastic chemotherapy: Secondary | ICD-10-CM

## 2013-10-16 DIAGNOSIS — E8581 Light chain (AL) amyloidosis: Secondary | ICD-10-CM

## 2013-10-16 DIAGNOSIS — T451X5A Adverse effect of antineoplastic and immunosuppressive drugs, initial encounter: Secondary | ICD-10-CM

## 2013-10-16 DIAGNOSIS — Z5112 Encounter for antineoplastic immunotherapy: Secondary | ICD-10-CM

## 2013-10-16 LAB — CBC WITH DIFFERENTIAL/PLATELET
BASO%: 0.5 % (ref 0.0–2.0)
Basophils Absolute: 0.1 10*3/uL (ref 0.0–0.1)
EOS%: 0.4 % (ref 0.0–7.0)
Eosinophils Absolute: 0.1 10*3/uL (ref 0.0–0.5)
HCT: 35 % — ABNORMAL LOW (ref 38.4–49.9)
HGB: 11.3 g/dL — ABNORMAL LOW (ref 13.0–17.1)
LYMPH%: 5.1 % — ABNORMAL LOW (ref 14.0–49.0)
MCH: 30.3 pg (ref 27.2–33.4)
MCHC: 32.3 g/dL (ref 32.0–36.0)
MCV: 93.8 fL (ref 79.3–98.0)
MONO#: 0.3 10*3/uL (ref 0.1–0.9)
MONO%: 2.4 % (ref 0.0–14.0)
NEUT#: 11.8 10*3/uL — ABNORMAL HIGH (ref 1.5–6.5)
NEUT%: 91.6 % — ABNORMAL HIGH (ref 39.0–75.0)
Platelets: 164 10*3/uL (ref 140–400)
RBC: 3.73 10*6/uL — ABNORMAL LOW (ref 4.20–5.82)
RDW: 16.4 % — ABNORMAL HIGH (ref 11.0–14.6)
WBC: 12.9 10*3/uL — ABNORMAL HIGH (ref 4.0–10.3)
lymph#: 0.7 10*3/uL — ABNORMAL LOW (ref 0.9–3.3)

## 2013-10-16 MED ORDER — ONDANSETRON HCL 8 MG PO TABS
8.0000 mg | ORAL_TABLET | Freq: Once | ORAL | Status: AC
  Administered 2013-10-16: 8 mg via ORAL

## 2013-10-16 MED ORDER — BORTEZOMIB CHEMO SQ INJECTION 3.5 MG (2.5MG/ML)
1.3000 mg/m2 | Freq: Once | INTRAMUSCULAR | Status: AC
  Administered 2013-10-16: 2.75 mg via SUBCUTANEOUS
  Filled 2013-10-16: qty 2.75

## 2013-10-16 MED ORDER — ONDANSETRON HCL 8 MG PO TABS
ORAL_TABLET | ORAL | Status: AC
  Filled 2013-10-16: qty 1

## 2013-10-16 NOTE — Progress Notes (Signed)
Continues to have itchy eyes and will bruises when he rubs them.

## 2013-10-16 NOTE — Telephone Encounter (Signed)
gv and printed appt sched and avs for pt for DEC °

## 2013-10-16 NOTE — Progress Notes (Signed)
[facility]    OFFICE PROGRESS NOTE   INTERVAL HISTORY:   He returns for scheduled followup of multiple myeloma and amyloidosis. He continues to bruise easily. He is completing a cycle of weekly Velcade/Decadron today. He has noted increased neuropathy symptoms in the hands and fingers. It is difficult to button his shirt. He continues to wear a left shoulder sling.  Objective:  Vital signs in last 24 hours:  Blood pressure 108/57, pulse 82, temperature 97.8 F (36.6 C), temperature source Oral, resp. rate 20, height 6\' 1"  (1.854 m), weight 194 lb 11.2 oz (88.315 kg).    HEENT: No thrush, small ecchymosis at the tongue. Resp: Lungs clear bilaterally Cardio: Regular rate and rhythm GI: No hepatosplenomegaly, nontender Vascular: 1+ pitting edema at the right greater than left lower leg and ankle Neuro: Moderate to severe decrease in vibratory sense the fingertips bilaterally, he had difficulty taking up a page of paper from the counter top.  Skin: Periorbital ecchymoses   Lab Results:  Lab Results  Component Value Date   WBC 12.9* 10/16/2013   HGB 11.3* 10/16/2013   HCT 35.0* 10/16/2013   MCV 93.8 10/16/2013   PLT 164 10/16/2013   ANC 11.8  Lambda free light chains on 09/25/2013-2.37  Medications: I have reviewed the patient's current medications.  Assessment/Plan: 1. 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 2. "Bruising" at the eyelids and mouth: Likely related to amyloidosis, persistent. 3. Numbness and loss of vibratory sense at the fingertips: This predated Velcade-based therapy, but worsened. Now much improved following cervical spine surgery. 4. Elevated serum free lambda light chains. The lambda light chains were lower on November 16 and slightly higher on 12/14/2011. 5. Lambda light chain proteinuria. 6. Bone marrow plasmacytosis: Variable increase in plasma cells noted on the bone marrow biopsy 07/29/2011 with plasma cells estimated to  represent between 4% and 20% of the cellular population. 7. Remote history of prostate cancer. 8. Sleep apnea. 9. Dyslipidemia. 10. Report of pneumonia on 2 occasions in 2011. 11. Admission to a hospital in [place], [place] October 2012 with "pneumonia." A chest x-ray at [facility] on November 2 was negative .  12. Low serum immunoglobulin G level. 13. Plasma cell dyscrasia with associated amyloidosis.  a. Initiation of systemic therapy with Cytoxan, Velcade, and Decadron 08/12/2011. Cycle #2 was initiated on 09/16/2011. Cycle #3 was initiated on 10/15/2011. Velcade was placed on hold due to neuropathy. b. The serum free lambda light chains were decreased on October 08 2011. c. The serum free lambda light chains were slightly increased on 12/14/2011, lower on 12/29/2011 and 02/02/2012. d. Initiation of Revlimid/Decadron February 2013, cycle 2 started on 01/28/2012. e. High-dose melphalan chemotherapy followed by autologous stem cell infusion on 02/09/2013 f. Initiation of Velcade/Decadrontherapy 05/28/2013, he completed 2 cycles on a day 1, 4, 8, 11 schedule g. Improvement in the serum free lambda light chains following induction Velcade/Decadron  h. Initiation of weekly Velcade/Decadron on 07/16/2013 i. Improvement in the serum free lambda light chains on 08/21/2013 and 09/18/2013 [facility]) j. Normal serum free lambda light chains on 09/25/2013 15. Loss of "balance "and proximal motor weakness secondary to cervical stenosis-status post decompression surgery on 02/19/2012. Improved, but not resolved stenosis with mass effect on the spinal cord noted on a repeat MRI 03/28/2012. He underwent further decompression surgery on 04/06/2012. The ataxia, weakness, and peripheral numbness is much improved.  16. Right lower Extremity deep vein thrombosis 02/02/2012-a Doppler ultrasound confirmed a gastrocnemius and peroneal deep vein  thrombosis. An IVC filter was placed prior to surgery and he was  maintained on Lovenox. The IVC filter was removed on 05/04/2012.  17. Anorexia following cervical spine surgery. Improved.  18. Episode of flank pain and hematuria in 03/31/2012-etiology unclear. He completed a course of antibiotics. The hematuria and pain have resolved.  19. Rectal wall thickening noted on a CT of the pelvis 03/31/2012.  20. colonoscopy showed an area of abnormality at the rectum with biopsy positive for amyloid.  21. Lung nodule noted on the CT 03/31/2012 , no lung nodule on a chest CT 07/30/2013  22. Lumbar stenosis-status post surgery on 07/31/2012.  23. Status post stem cell collection.  24.Rright internal jugular thrombosis, 10/22/2012, likely related to pheresis catheter and HIT  25. Admission with acute right pulmonary embolism 10/27/2012  26. Superior mesenteric vein/portal vein thrombosis diagnosed on a CT of the abdomen 10/28/2012 and 10/29/2012-treated with Argatroban , maintained on Coumadin anticoagulation until he hospital admission March 2014 for stem cell therapy  27. HIT confirmed on a serotonin release assay 10/29/2012  28. History ofRenal insufficiency during the December 2013 hospital admission-likely related to dehydration and? Contrast nephropathy  29. Dysphagia-? Secondary to amyloidosis, reported when he was here on 05/08/2013. He did not complain of dysphagia today  30. Anemia secondary to multiple myeloma and chemotherapy , stable  31.right lower Extremity deep vein thrombosis diagnosed at [facility] July 2014 , repeat Doppler at [facility] 07/19/2013 with chronic thrombus in the right gastrocnemius, maintained on Eliquis  32. Fall August 2014 with a left shoulder injury, diagnosed with a rotator cuff tear, status post surgical repair 09/19/2013  33. 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 The neuropathy symptoms are now interfering with function.   Disposition:  The serum free light chains were normal on 09/25/2013. He will complete the  current cycle of Velcade/Decadron today. He has developed progressive neuropathy symptoms, now interfering with activity. I recommend discontinuing further Velcade/Decadron after today's treatment. I will contact Dr. Marissa Calamity to be sure he is in agreement.   Mr. Ratchford will return for an office visit in one month.   [name], MD  10/16/2013  12:22 PM

## 2013-10-16 NOTE — Patient Instructions (Signed)
[facility] Discharge Instructions for Patients Receiving Chemotherapy  Today you received the following chemotherapy agents: Velcade.  To help prevent nausea and vomiting after your treatment, we encourage you to take your nausea medication as prescribed.   If you develop nausea and vomiting that is not controlled by your nausea medication, call the clinic.   BELOW ARE SYMPTOMS THAT SHOULD BE REPORTED IMMEDIATELY:  *FEVER GREATER THAN 100.5 F  *CHILLS WITH OR WITHOUT FEVER  NAUSEA AND VOMITING THAT IS NOT CONTROLLED WITH YOUR NAUSEA MEDICATION  *UNUSUAL SHORTNESS OF BREATH  *UNUSUAL BRUISING OR BLEEDING  TENDERNESS IN MOUTH AND THROAT WITH OR WITHOUT PRESENCE OF ULCERS  *URINARY PROBLEMS  *BOWEL PROBLEMS  UNUSUAL RASH Items with * indicate a potential emergency and should be followed up as soon as possible.  Feel free to call the clinic you have any questions or concerns. The clinic phone number is [phone].    

## 2013-10-17 LAB — KAPPA/LAMBDA LIGHT CHAINS
Kappa free light chain: 0.61 mg/dL (ref 0.33–1.94)
Kappa:Lambda Ratio: 0.29 (ref 0.26–1.65)
Lambda Free Lght Chn: 2.08 mg/dL (ref 0.57–2.63)

## 2013-10-26 ENCOUNTER — Telehealth: Payer: Self-pay | Admitting: *Deleted

## 2013-10-26 NOTE — Telephone Encounter (Signed)
Call from pt reporting "head cold." Has not checked temp, driving home from [facility]. Feels "stuffy" and warm. Reviewed with Dr. Truett Perna: OK to take OTC decongestant. Will need to be seen in office if he develops shaking chills or fever. Pt voiced understanding.

## 2013-11-13 ENCOUNTER — Other Ambulatory Visit (HOSPITAL_BASED_OUTPATIENT_CLINIC_OR_DEPARTMENT_OTHER): Payer: Medicare Other

## 2013-11-13 ENCOUNTER — Ambulatory Visit (HOSPITAL_BASED_OUTPATIENT_CLINIC_OR_DEPARTMENT_OTHER): Payer: Medicare Other | Admitting: Oncology

## 2013-11-13 ENCOUNTER — Telehealth: Payer: Self-pay | Admitting: Oncology

## 2013-11-13 VITALS — BP 125/66 | HR 88 | Temp 97.4°F | Resp 20 | Ht 73.0 in | Wt 190.1 lb

## 2013-11-13 DIAGNOSIS — C9000 Multiple myeloma not having achieved remission: Secondary | ICD-10-CM

## 2013-11-13 LAB — COMPREHENSIVE METABOLIC PANEL (CC13)
ALT: 10 U/L (ref 0–55)
AST: 14 U/L (ref 5–34)
Albumin: 3.4 g/dL — ABNORMAL LOW (ref 3.5–5.0)
Alkaline Phosphatase: 71 U/L (ref 40–150)
Anion Gap: 9 meq/L (ref 3–11)
BUN: 22.9 mg/dL (ref 7.0–26.0)
CO2: 26 meq/L (ref 22–29)
Calcium: 9.9 mg/dL (ref 8.4–10.4)
Chloride: 106 meq/L (ref 98–109)
Creatinine: 1.4 mg/dL — ABNORMAL HIGH (ref 0.7–1.3)
Glucose: 137 mg/dL (ref 70–140)
Potassium: 4.3 meq/L (ref 3.5–5.1)
Sodium: 141 meq/L (ref 136–145)
Total Bilirubin: 0.36 mg/dL (ref 0.20–1.20)
Total Protein: 6.2 g/dL — ABNORMAL LOW (ref 6.4–8.3)

## 2013-11-13 LAB — CBC WITH DIFFERENTIAL/PLATELET
BASO%: 1.1 % (ref 0.0–2.0)
Basophils Absolute: 0.1 10*3/uL (ref 0.0–0.1)
EOS%: 1.7 % (ref 0.0–7.0)
Eosinophils Absolute: 0.1 10*3/uL (ref 0.0–0.5)
HCT: 36.2 % — ABNORMAL LOW (ref 38.4–49.9)
HGB: 11.9 g/dL — ABNORMAL LOW (ref 13.0–17.1)
LYMPH%: 18.7 % (ref 14.0–49.0)
MCH: 31.2 pg (ref 27.2–33.4)
MCHC: 32.9 g/dL (ref 32.0–36.0)
MCV: 94.8 fL (ref 79.3–98.0)
MONO#: 0.7 10*3/uL (ref 0.1–0.9)
MONO%: 11.5 % (ref 0.0–14.0)
NEUT#: 3.8 10*3/uL (ref 1.5–6.5)
NEUT%: 67 % (ref 39.0–75.0)
Platelets: 189 10*3/uL (ref 140–400)
RBC: 3.82 10*6/uL — ABNORMAL LOW (ref 4.20–5.82)
RDW: 16.4 % — ABNORMAL HIGH (ref 11.0–14.6)
WBC: 5.7 10*3/uL (ref 4.0–10.3)
lymph#: 1.1 10*3/uL (ref 0.9–3.3)

## 2013-11-13 NOTE — Telephone Encounter (Signed)
gv and printed appt sched and avs for pt for Jan 2015 °

## 2013-11-13 NOTE — Progress Notes (Signed)
[facility]    OFFICE PROGRESS NOTE   INTERVAL HISTORY:   He returns for scheduled followup of amyloidosis. He is now off of systemic therapy. The finger numbness has improved partially. He continues to have pruritus surrounding the eyes. He bruises easily in the mouth.  Objective:  Vital signs in last 24 hours:  Blood pressure 125/66, pulse 88, temperature 97.4 F (36.3 C), temperature source Oral, resp. rate 20, height 6\' 1"  (1.854 m), weight 190 lb 1.6 oz (86.229 kg).    HEENT: Oral cavity without ecchymoses or thrush Resp: Lungs clear bilaterally Cardio: Regular rate and rhythm GI: No hepatosplenomegaly Vascular: Trace edema at the right lower leg Neuro: The motor exam appears intact in the arms and hands      Lab Results:  Lab Results  Component Value Date   WBC 5.7 11/13/2013   HGB 11.9* 11/13/2013   HCT 36.2* 11/13/2013   MCV 94.8 11/13/2013   PLT 189 11/13/2013   ANC 3.8 BUN 22.9, creatinine 1.4  Lambda free light chains 10/16/2013-2.08  Medications: I have reviewed the patient's current medications.  Assessment/Plan: 1. 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 2. "Bruising" at the eyelids and mouth: Likely related to amyloidosis, persistent. 3. Numbness and loss of vibratory sense at the fingertips: This predated Velcade-based therapy, but worsened. Now much improved following cervical spine surgery. 4. Elevated serum free lambda light chains. The lambda light chains were lower on November 16 and slightly higher on 12/14/2011. 5. Lambda light chain proteinuria. 6. Bone marrow plasmacytosis: Variable increase in plasma cells noted on the bone marrow biopsy 07/29/2011 with plasma cells estimated to represent between 4% and 20% of the cellular population. 7. Remote history of prostate cancer. 8. Sleep apnea. 9. Dyslipidemia. 10. Report of pneumonia on 2 occasions in 2011. 11. Admission to a hospital in [place], [place] October  2012 with "pneumonia." A chest x-ray at [facility] on November 2 was negative .  12. Low serum immunoglobulin G level. 13. Plasma cell dyscrasia with associated amyloidosis.  a. Initiation of systemic therapy with Cytoxan, Velcade, and Decadron 08/12/2011. Cycle #2 was initiated on 09/16/2011. Cycle #3 was initiated on 10/15/2011. Velcade was placed on hold due to neuropathy. b. The serum free lambda light chains were decreased on October 08 2011. c. The serum free lambda light chains were slightly increased on 12/14/2011, lower on 12/29/2011 and 02/02/2012. d. Initiation of Revlimid/Decadron February 2013, cycle 2 started on 01/28/2012. e. High-dose melphalan chemotherapy followed by autologous stem cell infusion on 02/09/2013 f. Initiation of Velcade/Decadrontherapy 05/28/2013, he completed 2 cycles on a day 1, 4, 8, 11 schedule g. Improvement in the serum free lambda light chains following induction Velcade/Decadron  h. Initiation of weekly Velcade/Decadron on 07/16/2013, last treatment on 10/16/2013 i. Improvement in the serum free lambda light chains on 08/21/2013 and 09/18/2013 [facility]) j. Normal serum free lambda light chains on 10/16/2013 15. Loss of "balance "and proximal motor weakness secondary to cervical stenosis-status post decompression surgery on 02/19/2012. Improved, but not resolved stenosis with mass effect on the spinal cord noted on a repeat MRI 03/28/2012. He underwent further decompression surgery on 04/06/2012. The ataxia, weakness, and peripheral numbness is much improved.  16. Right lower Extremity deep vein thrombosis 02/02/2012-a Doppler ultrasound confirmed a gastrocnemius and peroneal deep vein thrombosis. An IVC filter was placed prior to surgery and he was maintained on Lovenox. The IVC filter was removed on 05/04/2012.  17. Anorexia following cervical spine surgery. Improved.  18. Episode of flank pain and hematuria in 03/31/2012-etiology unclear. He completed a course  of antibiotics. The hematuria and pain have resolved.  19. Rectal wall thickening noted on a CT of the pelvis 03/31/2012.  20. colonoscopy showed an area of abnormality at the rectum with biopsy positive for amyloid.  21. Lung nodule noted on the CT 03/31/2012 , no lung nodule on a chest CT 07/30/2013  22. Lumbar stenosis-status post surgery on 07/31/2012.  23. Status post stem cell collection.  24.Rright internal jugular thrombosis, 10/22/2012, likely related to pheresis catheter and HIT  25. Admission with acute right pulmonary embolism 10/27/2012  26. Superior mesenteric vein/portal vein thrombosis diagnosed on a CT of the abdomen 10/28/2012 and 10/29/2012-treated with Argatroban , maintained on Coumadin anticoagulation until he hospital admission March 2014 for stem cell therapy  27. HIT confirmed on a serotonin release assay 10/29/2012  28. History ofRenal insufficiency during the December 2013 hospital admission-likely related to dehydration and? Contrast nephropathy , persistent mild elevation of the creatinine 29. Dysphagia-? Secondary to amyloidosis, reported when he was here on 05/08/2013. He did not complain of dysphagia today  30. Anemia secondary to multiple myeloma and chemotherapy , improved 31.right lower Extremity deep vein thrombosis diagnosed at [facility] July 2014 , repeat Doppler at [facility] 07/19/2013 with chronic thrombus in the right gastrocnemius, maintained on Eliquis  32. Fall August 2014 with a left shoulder injury, diagnosed with a rotator cuff tear, status post surgical repair 09/19/2013  33. 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 The neuropathy symptoms have partially improved sense of Velcade was discontinued after treatment 10/16/2013      Disposition:  He appears well. The plan is to follow him with observation. He will return for an office and lab visit in one month. Mr. Haris is scheduled to see Dr. Marissa Calamity in February.   [name],  MD  11/13/2013  5:40 PM

## 2013-11-23 DIAGNOSIS — M25519 Pain in unspecified shoulder: Secondary | ICD-10-CM | POA: Diagnosis not present

## 2013-11-23 DIAGNOSIS — M75120 Complete rotator cuff tear or rupture of unspecified shoulder, not specified as traumatic: Secondary | ICD-10-CM | POA: Diagnosis not present

## 2013-11-23 DIAGNOSIS — M6281 Muscle weakness (generalized): Secondary | ICD-10-CM | POA: Diagnosis not present

## 2013-11-26 DIAGNOSIS — M6281 Muscle weakness (generalized): Secondary | ICD-10-CM | POA: Diagnosis not present

## 2013-11-26 DIAGNOSIS — M75120 Complete rotator cuff tear or rupture of unspecified shoulder, not specified as traumatic: Secondary | ICD-10-CM | POA: Diagnosis not present

## 2013-11-26 DIAGNOSIS — M25519 Pain in unspecified shoulder: Secondary | ICD-10-CM | POA: Diagnosis not present

## 2013-11-28 DIAGNOSIS — M6281 Muscle weakness (generalized): Secondary | ICD-10-CM | POA: Diagnosis not present

## 2013-11-28 DIAGNOSIS — M25519 Pain in unspecified shoulder: Secondary | ICD-10-CM | POA: Diagnosis not present

## 2013-11-28 DIAGNOSIS — M75120 Complete rotator cuff tear or rupture of unspecified shoulder, not specified as traumatic: Secondary | ICD-10-CM | POA: Diagnosis not present

## 2013-12-03 DIAGNOSIS — M75120 Complete rotator cuff tear or rupture of unspecified shoulder, not specified as traumatic: Secondary | ICD-10-CM | POA: Diagnosis not present

## 2013-12-03 DIAGNOSIS — M25519 Pain in unspecified shoulder: Secondary | ICD-10-CM | POA: Diagnosis not present

## 2013-12-06 DIAGNOSIS — M75120 Complete rotator cuff tear or rupture of unspecified shoulder, not specified as traumatic: Secondary | ICD-10-CM | POA: Diagnosis not present

## 2013-12-06 DIAGNOSIS — M6281 Muscle weakness (generalized): Secondary | ICD-10-CM | POA: Diagnosis not present

## 2013-12-06 DIAGNOSIS — M25519 Pain in unspecified shoulder: Secondary | ICD-10-CM | POA: Diagnosis not present

## 2013-12-13 ENCOUNTER — Other Ambulatory Visit (HOSPITAL_BASED_OUTPATIENT_CLINIC_OR_DEPARTMENT_OTHER): Payer: Medicare Other

## 2013-12-13 ENCOUNTER — Ambulatory Visit (HOSPITAL_BASED_OUTPATIENT_CLINIC_OR_DEPARTMENT_OTHER): Payer: Medicare Other | Admitting: Oncology

## 2013-12-13 ENCOUNTER — Telehealth: Payer: Self-pay | Admitting: Oncology

## 2013-12-13 VITALS — BP 122/71 | HR 79 | Temp 97.8°F | Resp 19 | Ht 73.0 in | Wt 191.8 lb

## 2013-12-13 DIAGNOSIS — N289 Disorder of kidney and ureter, unspecified: Secondary | ICD-10-CM

## 2013-12-13 DIAGNOSIS — C9000 Multiple myeloma not having achieved remission: Secondary | ICD-10-CM

## 2013-12-13 DIAGNOSIS — M25519 Pain in unspecified shoulder: Secondary | ICD-10-CM | POA: Diagnosis not present

## 2013-12-13 DIAGNOSIS — E859 Amyloidosis, unspecified: Secondary | ICD-10-CM | POA: Diagnosis not present

## 2013-12-13 DIAGNOSIS — D6481 Anemia due to antineoplastic chemotherapy: Secondary | ICD-10-CM

## 2013-12-13 DIAGNOSIS — T451X5A Adverse effect of antineoplastic and immunosuppressive drugs, initial encounter: Secondary | ICD-10-CM

## 2013-12-13 DIAGNOSIS — M75120 Complete rotator cuff tear or rupture of unspecified shoulder, not specified as traumatic: Secondary | ICD-10-CM | POA: Diagnosis not present

## 2013-12-13 DIAGNOSIS — R1310 Dysphagia, unspecified: Secondary | ICD-10-CM

## 2013-12-13 DIAGNOSIS — G609 Hereditary and idiopathic neuropathy, unspecified: Secondary | ICD-10-CM

## 2013-12-13 DIAGNOSIS — I82409 Acute embolism and thrombosis of unspecified deep veins of unspecified lower extremity: Secondary | ICD-10-CM

## 2013-12-13 LAB — CBC WITH DIFFERENTIAL/PLATELET
BASO%: 1.4 % (ref 0.0–2.0)
Basophils Absolute: 0.1 10*3/uL (ref 0.0–0.1)
EOS%: 3.5 % (ref 0.0–7.0)
Eosinophils Absolute: 0.2 10*3/uL (ref 0.0–0.5)
HCT: 33.9 % — ABNORMAL LOW (ref 38.4–49.9)
HGB: 11.3 g/dL — ABNORMAL LOW (ref 13.0–17.1)
LYMPH%: 28 % (ref 14.0–49.0)
MCH: 32 pg (ref 27.2–33.4)
MCHC: 33.4 g/dL (ref 32.0–36.0)
MCV: 95.8 fL (ref 79.3–98.0)
MONO#: 0.9 10*3/uL (ref 0.1–0.9)
MONO%: 18.1 % — ABNORMAL HIGH (ref 0.0–14.0)
NEUT#: 2.4 10*3/uL (ref 1.5–6.5)
NEUT%: 49 % (ref 39.0–75.0)
Platelets: 162 10*3/uL (ref 140–400)
RBC: 3.54 10*6/uL — ABNORMAL LOW (ref 4.20–5.82)
RDW: 17.9 % — ABNORMAL HIGH (ref 11.0–14.6)
WBC: 4.8 10*3/uL (ref 4.0–10.3)
lymph#: 1.4 10*3/uL (ref 0.9–3.3)

## 2013-12-13 LAB — COMPREHENSIVE METABOLIC PANEL (CC13)
ALT: 10 U/L (ref 0–55)
AST: 15 U/L (ref 5–34)
Albumin: 3.5 g/dL (ref 3.5–5.0)
Alkaline Phosphatase: 69 U/L (ref 40–150)
Anion Gap: 9 mEq/L (ref 3–11)
BUN: 22.1 mg/dL (ref 7.0–26.0)
CO2: 26 mEq/L (ref 22–29)
Calcium: 9.6 mg/dL (ref 8.4–10.4)
Chloride: 106 mEq/L (ref 98–109)
Creatinine: 1.3 mg/dL (ref 0.7–1.3)
Glucose: 98 mg/dl (ref 70–140)
Potassium: 4.1 mEq/L (ref 3.5–5.1)
Sodium: 140 mEq/L (ref 136–145)
Total Bilirubin: 0.43 mg/dL (ref 0.20–1.20)
Total Protein: 6 g/dL — ABNORMAL LOW (ref 6.4–8.3)

## 2013-12-13 NOTE — Progress Notes (Signed)
 [facility]    OFFICE PROGRESS NOTE   INTERVAL HISTORY:   Returns for scheduled followup of multiple myeloma and amyloidosis. He has noted partial improvement in the neuropathy symptoms at the fingers. Stable bruising. He had an upper respiratory infection after Christmas. He is scheduled to be seen at [facility] within the next few weeks.  Objective:  Vital signs in last 24 hours:  Blood pressure 122/71, pulse 79, temperature 97.8 F (36.6 C), temperature source Oral, resp. rate 19, height 6' 1" (1.854 m), weight 191 lb 12.8 oz (87 kg).    HEENT: Macroglossia, no ecchymoses in the mouth. Small periorbital ecchymoses. Resp: Lungs clear bilaterally Cardio: Regular rate and rhythm GI: Soft and nontender, no hepatosplenomegaly Vascular: 1+ edema at the right lower leg  Skin: Ecchymoses at the fingertips   Portacath/PICC-without erythema  Lab Results:  Lab Results  Component Value Date   WBC 4.8 12/13/2013   HGB 11.3* 12/13/2013   HCT 33.9* 12/13/2013   MCV 95.8 12/13/2013   PLT 162 12/13/2013   NEUTROABS 2.4 12/13/2013   Lambda free light chains 2.08 on 10/16/2013   Medications: I have reviewed the patient's current medications.  Assessment/Plan: 1. 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 2. "Bruising" at the eyelids and mouth: Likely related to amyloidosis, persistent. 3. Numbness and loss of vibratory sense at the fingertips: This predated Velcade-based therapy, but worsened. Now much improved following cervical spine surgery. 4. Elevated serum free lambda light chains. The lambda light chains were lower on November 16 and slightly higher on 12/14/2011. 5. Lambda light chain proteinuria. 6. Bone marrow plasmacytosis: Variable increase in plasma cells noted on the bone marrow biopsy 07/29/2011 with plasma cells estimated to represent between 4% and 20% of the cellular population. 7. Remote history of prostate cancer. 8. Sleep  apnea. 9. Dyslipidemia. 10. Report of pneumonia on 2 occasions in 2011. 11. Admission to a hospital in [place], [place] October 2012 with "pneumonia." A chest x-ray at [place] on November 2 was negative .  12. Low serum immunoglobulin G level. 13. Plasma cell dyscrasia with associated amyloidosis.  a. Initiation of systemic therapy with Cytoxan, Velcade, and Decadron 08/12/2011. Cycle #2 was initiated on 09/16/2011. Cycle #3 was initiated on 10/15/2011. Velcade was placed on hold due to neuropathy. b. The serum free lambda light chains were decreased on October 08 2011. c. The serum free lambda light chains were slightly increased on 12/14/2011, lower on 12/29/2011 and 02/02/2012. d. Initiation of Revlimid/Decadron February 2013, cycle 2 started on 01/28/2012. e. High-dose melphalan chemotherapy followed by autologous stem cell infusion on 02/09/2013 f. Initiation of Velcade/Decadrontherapy 05/28/2013, he completed 2 cycles on a day 1, 4, 8, 11 schedule g. Improvement in the serum free lambda light chains following induction Velcade/Decadron  h. Initiation of weekly Velcade/Decadron on 07/16/2013, last treatment on 10/16/2013 i. Improvement in the serum free lambda light chains on 08/21/2013 and 09/18/2013 ([facility]) j. Normal serum free lambda light chains on 10/16/2013 15. Loss of "balance "and proximal motor weakness secondary to cervical stenosis-status post decompression surgery on 02/19/2012. Improved, but not resolved stenosis with mass effect on the spinal cord noted on a repeat MRI 03/28/2012. He underwent further decompression surgery on 04/06/2012. The ataxia, weakness, and peripheral numbness is much improved.  16. Right lower Extremity deep vein thrombosis 02/02/2012-a Doppler ultrasound confirmed a gastrocnemius and peroneal deep vein thrombosis. An IVC filter was placed prior to surgery and he was maintained on Lovenox. The IVC filter was   removed on 05/04/2012.  17. Anorexia  following cervical spine surgery. Improved.  18. Episode of flank pain and hematuria in 03/31/2012-etiology unclear. He completed a course of antibiotics. The hematuria and pain have resolved.  19. Rectal wall thickening noted on a CT of the pelvis 03/31/2012.  20. colonoscopy showed an area of abnormality at the rectum with biopsy positive for amyloid.  21. Lung nodule noted on the CT 03/31/2012 , no lung nodule on a chest CT 07/30/2013  22. Lumbar stenosis-status post surgery on 07/31/2012.  23. Status post stem cell collection.  24.Rright internal jugular thrombosis, 10/22/2012, likely related to pheresis catheter and HIT  25. Admission with acute right pulmonary embolism 10/27/2012  26. Superior mesenteric vein/portal vein thrombosis diagnosed on a CT of the abdomen 10/28/2012 and 10/29/2012-treated with Argatroban , maintained on Coumadin anticoagulation until he hospital admission March 2014 for stem cell therapy  27. HIT confirmed on a serotonin release assay 10/29/2012  28. History ofRenal insufficiency during the December 2013 hospital admission-likely related to dehydration and? Contrast nephropathy , persistent mild elevation of the creatinine  29. Dysphagia-? Secondary to amyloidosis, reported when he was here on 05/08/2013. He did not complain of dysphagia today  30. Anemia secondary to multiple myeloma and chemotherapy , improved  31.right lower Extremity deep vein thrombosis diagnosed at [facility] July 2014 , repeat Doppler at [facility] 07/19/2013 with chronic thrombus in the right gastrocnemius, maintained on Eliquis  32. Fall August 2014 with a left shoulder injury, diagnosed with a rotator cuff tear, status post surgical repair 09/19/2013  33. 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 The neuropathy symptoms have partially improved sense of Velcade was discontinued after treatment 10/16/2013     Disposition:  He appears stable. We will followup on the serum free  light chains from today. He will followup at [facility] next month. Mr. Visser will return for an office visit here 02/26/2014.   Betsy Coder, MD  12/13/2013  2:03 PM

## 2013-12-13 NOTE — Telephone Encounter (Signed)
gv adn printed appt sched and avs for pt for April  °

## 2013-12-14 LAB — KAPPA/LAMBDA LIGHT CHAINS
Kappa free light chain: 1.21 mg/dL (ref 0.33–1.94)
Kappa:Lambda Ratio: 0.45 (ref 0.26–1.65)
Lambda Free Lght Chn: 2.68 mg/dL — ABNORMAL HIGH (ref 0.57–2.63)

## 2013-12-15 IMAGING — XA IR IVC FILTER RETRIEVAL / S&I /IMG GUID/MOD SED
3 series · 10 of 10 positions shown · IV contrast (IODINE)
Comparison: none

IR IVC FILTER RETRIEVAL

Date: 05/04/2012
CLINICAL HISTORY: 66-year-old male with a history of calf DVT
requiring anticoagulation.  He underwent cervical surgery during
which time he required temporary caval interruption while he  could
not be anticoagulated.  He is now back on therapeutic Lovenox and
presents for retrieval of his optional IVC filter.

[Series 1: care body 4 · 4 of 41 frames shown (1 of 2)]
[frame 7/41]
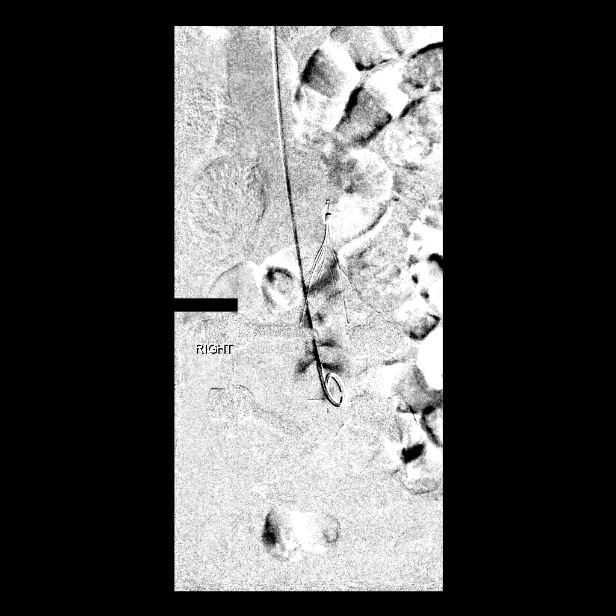
[frame 16/41]
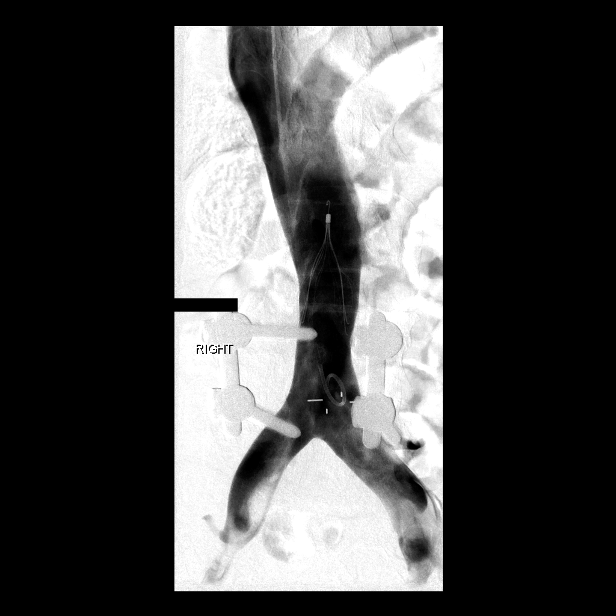
[frame 21/41]
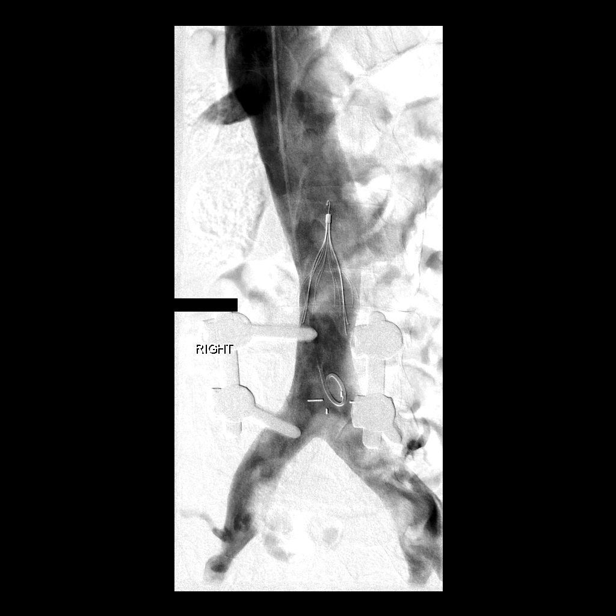
[frame 35/41]
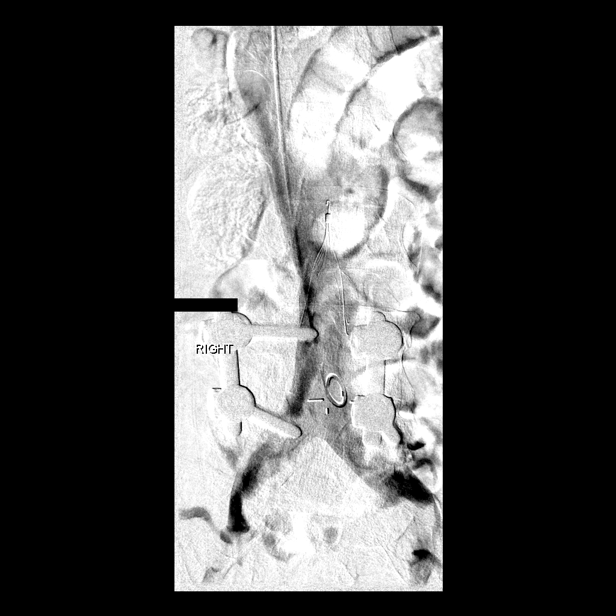

[Series 4: care body 4 · 4 of 20 frames shown (2 of 2)]
[frame 4/20]
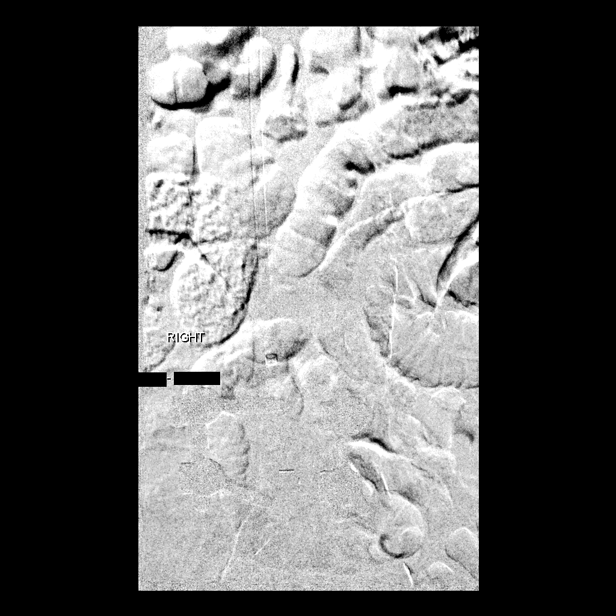
[frame 11/20]
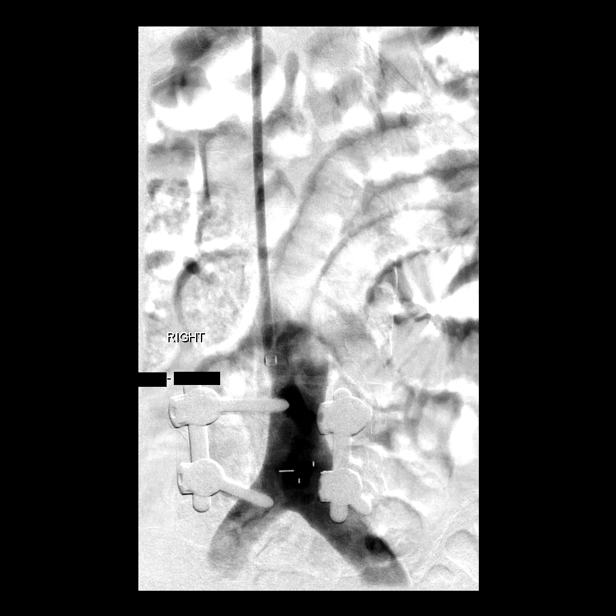
[frame 15/20]
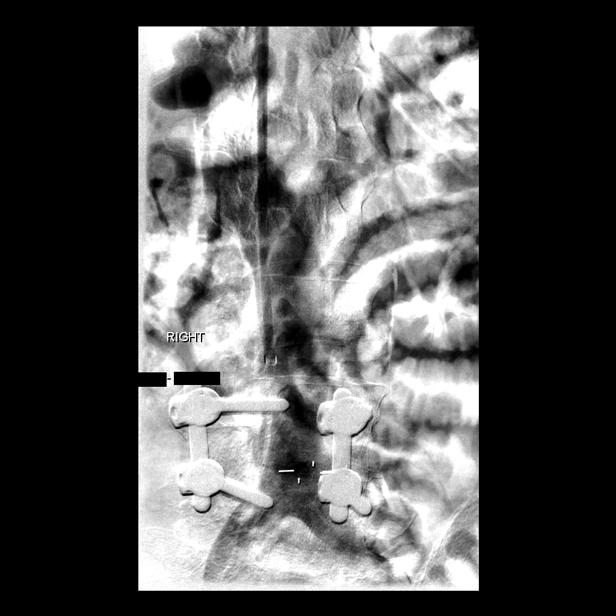
[frame 18/20]
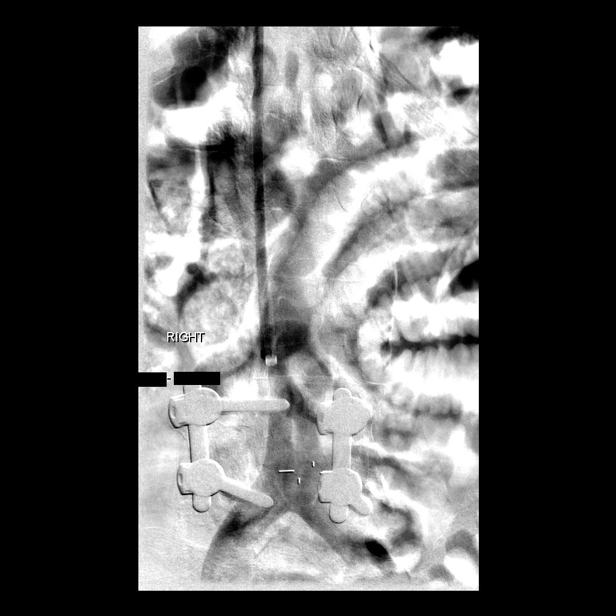

[Series 300: ir ivc filter retrieval / s&i /img guid/ · 2 of 2 slices shown]
[im 1/2]
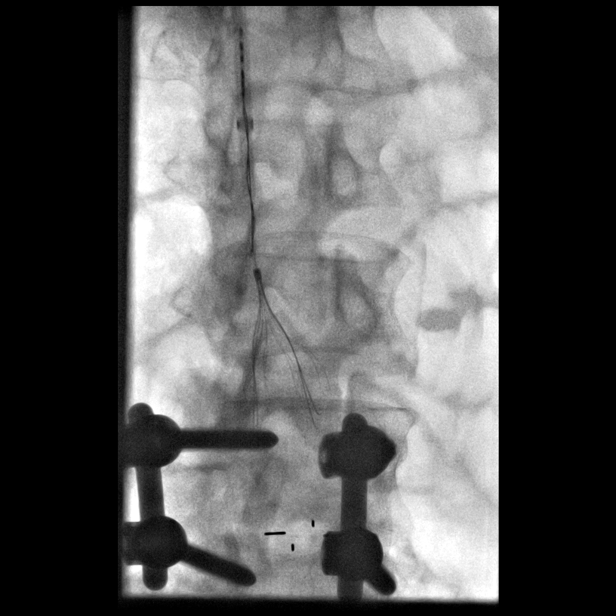
[im 2/2]
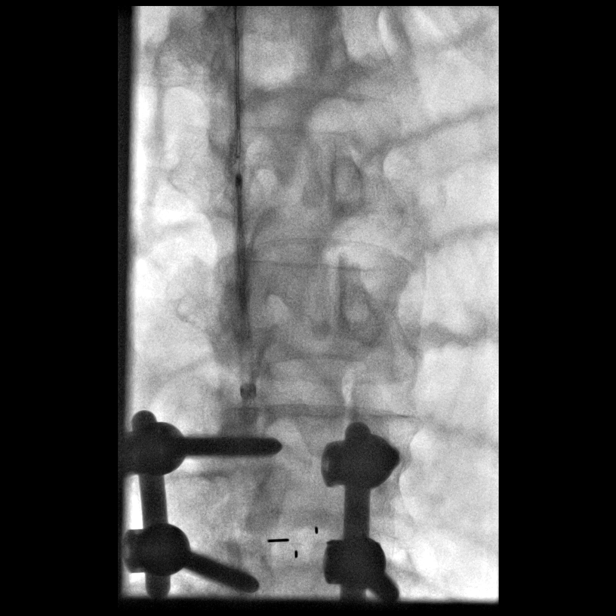

[10 of 10 positions shown; findings below may reference images not displayed]

Procedures Performed:
1. Ultrasound-guided puncture of the right internal jugular vein
2.  Catheterization of the distal IVC
3.  Inferior venacavagram #4.  Uncomplicated retrieval of optional
IVC filter
5.  Repeat inferior venacavagram
6.  Conscious sedation

Sedation: Moderate (conscious) sedation was used.  2 mg Versed, 100
mcg Fentanyl were administered intravenously.  The patient's vital
signs were monitored continuously by radiology nursing throughout
the procedure.

Sedation Time: 20 minutes

Fluoroscopy time: Five

Contrast volume: 50 ml Lmnipaque-PHH

PROCEDURE/FINDINGS:

 Informed consent was obtained from the patient following
explanation of the procedure, risks, benefits and alternatives.
The patient understands, agrees and consents for the procedure.
All questions were addressed. A time out was performed.

Maximal barrier sterile technique utilized including caps, mask,
sterile gowns, sterile gloves, large sterile drape, hand hygiene,
and chlorhexadine.

The right neck was interrogated with ultrasound and the internal
jugular vein found to be widely patent.  Local anesthesia was
obtained by infiltration of 1% lidocaine.  Under direct sonographic
guidance, the right internal jugular vein was punctured with a 21-
gauge micropuncture needle.  An image was obtained stored for the
electronic medical record.  A 0.018-inch wire was then advanced
into the superior vena cava and a micropuncture sheath advanced
into the internal jugular vein. A 0.035 inch Bentson wire was then
advanced into the left iliac vein and a pigtail catheter advanced
over the wire into the distal IVC below the inferior vena cava
filter.  Inferior vena cavography was then performed demonstrating
a normal IVC with no evidence of thrombus.  The filter is in good
position without tilting.

An 11-Aclecio Haubert was advanced over the wire and exchanged
for the pigtail catheter and positioned with the tip just proximal
to the IVC filter home.  A 25 mm gooseneck snare was then used to
snare the filter, and the 11-French sheath advanced over the
filter.  Once completely collapsed, the filter was removed.  A
repeat contrast injection through the 11-French sheath
demonstrating a patent IVC without evidence of spasm, injury or
retained filter fragment.

The sheath was removed and hemostasis attained by direct manual
pressure.  A sterile bandage was applied.

Complications:  No immediate complication.  The patient tolerated
the procedure well and was discharged in stable condition.

IMPRESSION
1.  No evidence of IVC thrombus.
2.  Successful retrieval of optional IVC filter.

[REDACTED]

## 2013-12-24 DIAGNOSIS — M6281 Muscle weakness (generalized): Secondary | ICD-10-CM | POA: Diagnosis not present

## 2013-12-24 DIAGNOSIS — M75120 Complete rotator cuff tear or rupture of unspecified shoulder, not specified as traumatic: Secondary | ICD-10-CM | POA: Diagnosis not present

## 2013-12-24 DIAGNOSIS — M25519 Pain in unspecified shoulder: Secondary | ICD-10-CM | POA: Diagnosis not present

## 2013-12-25 DIAGNOSIS — R0609 Other forms of dyspnea: Secondary | ICD-10-CM | POA: Diagnosis not present

## 2013-12-25 DIAGNOSIS — I43 Cardiomyopathy in diseases classified elsewhere: Secondary | ICD-10-CM | POA: Diagnosis not present

## 2013-12-25 DIAGNOSIS — Z9481 Bone marrow transplant status: Secondary | ICD-10-CM | POA: Diagnosis not present

## 2013-12-25 DIAGNOSIS — C9000 Multiple myeloma not having achieved remission: Secondary | ICD-10-CM | POA: Diagnosis not present

## 2013-12-25 DIAGNOSIS — E639 Nutritional deficiency, unspecified: Secondary | ICD-10-CM | POA: Diagnosis not present

## 2013-12-25 DIAGNOSIS — E859 Amyloidosis, unspecified: Secondary | ICD-10-CM | POA: Diagnosis not present

## 2013-12-25 DIAGNOSIS — Z86718 Personal history of other venous thrombosis and embolism: Secondary | ICD-10-CM | POA: Diagnosis not present

## 2013-12-25 DIAGNOSIS — R0989 Other specified symptoms and signs involving the circulatory and respiratory systems: Secondary | ICD-10-CM | POA: Diagnosis not present

## 2013-12-25 DIAGNOSIS — E8589 Other amyloidosis: Secondary | ICD-10-CM | POA: Diagnosis not present

## 2013-12-25 DIAGNOSIS — Z86711 Personal history of pulmonary embolism: Secondary | ICD-10-CM | POA: Diagnosis not present

## 2013-12-25 DIAGNOSIS — E785 Hyperlipidemia, unspecified: Secondary | ICD-10-CM | POA: Diagnosis not present

## 2013-12-27 DIAGNOSIS — M25519 Pain in unspecified shoulder: Secondary | ICD-10-CM | POA: Diagnosis not present

## 2013-12-31 DIAGNOSIS — M75120 Complete rotator cuff tear or rupture of unspecified shoulder, not specified as traumatic: Secondary | ICD-10-CM | POA: Diagnosis not present

## 2013-12-31 DIAGNOSIS — M25519 Pain in unspecified shoulder: Secondary | ICD-10-CM | POA: Diagnosis not present

## 2013-12-31 DIAGNOSIS — M6281 Muscle weakness (generalized): Secondary | ICD-10-CM | POA: Diagnosis not present

## 2014-01-01 ENCOUNTER — Telehealth: Payer: Self-pay | Admitting: *Deleted

## 2014-01-01 NOTE — Telephone Encounter (Signed)
Voice mail from patient that he needs to begin vaccinations post transplant. Should be getting orders from Dr. Marin Roberts office soon.

## 2014-01-01 NOTE — Telephone Encounter (Signed)
Left voice mail requesting vaccination schedule to be faxed to office-per note from Dr. Rosann Auerbach, needs to begin in April 2015. Also requested confirmation of the abbreviation TTE--does it mean transthoracic echocardiogram?

## 2014-01-02 ENCOUNTER — Telehealth: Payer: Self-pay | Admitting: *Deleted

## 2014-01-02 ENCOUNTER — Other Ambulatory Visit: Payer: Self-pay | Admitting: *Deleted

## 2014-01-02 DIAGNOSIS — C9000 Multiple myeloma not having achieved remission: Secondary | ICD-10-CM

## 2014-01-02 NOTE — Telephone Encounter (Signed)
Made wife aware that ECHO will be scheduled for March 2015 with [facility]-POF to scheduler. Also vaccines are due to begin in April. Will administer these on his 02/26/14 appointment with Dr. [name]. Vaccine schedule received from [facility].

## 2014-01-03 ENCOUNTER — Telehealth: Payer: Self-pay | Admitting: *Deleted

## 2014-01-03 NOTE — Telephone Encounter (Signed)
Printed the echo and gv to [place]. Will call pt w/ appt once it pre certed....td

## 2014-01-09 DIAGNOSIS — H5200 Hypermetropia, unspecified eye: Secondary | ICD-10-CM | POA: Diagnosis not present

## 2014-01-09 DIAGNOSIS — D485 Neoplasm of uncertain behavior of skin: Secondary | ICD-10-CM | POA: Diagnosis not present

## 2014-01-10 DIAGNOSIS — M25519 Pain in unspecified shoulder: Secondary | ICD-10-CM | POA: Diagnosis not present

## 2014-01-14 ENCOUNTER — Telehealth: Payer: Self-pay | Admitting: *Deleted

## 2014-01-14 NOTE — Telephone Encounter (Signed)
Call from pt asking when should he be scheduled for immunizations? Per last note from Dr. Melba Coon, 12 month vaccinations are due early April. Scheduled for 4/7. Left message on voicemail with this info.

## 2014-02-26 ENCOUNTER — Ambulatory Visit (HOSPITAL_BASED_OUTPATIENT_CLINIC_OR_DEPARTMENT_OTHER): Payer: Medicare Other | Admitting: Oncology

## 2014-02-26 ENCOUNTER — Ambulatory Visit (HOSPITAL_COMMUNITY)
Admission: RE | Admit: 2014-02-26 | Discharge: 2014-02-26 | Disposition: A | Discharge status: Home / Self Care | Payer: Medicare Other | Source: Ambulatory Visit | Attending: Oncology | Admitting: Oncology

## 2014-02-26 ENCOUNTER — Telehealth: Payer: Self-pay | Admitting: Oncology

## 2014-02-26 ENCOUNTER — Other Ambulatory Visit (HOSPITAL_BASED_OUTPATIENT_CLINIC_OR_DEPARTMENT_OTHER): Payer: Medicare Other

## 2014-02-26 ENCOUNTER — Ambulatory Visit: Payer: Medicare Other

## 2014-02-26 VITALS — BP 122/56 | HR 95 | Temp 99.8°F | Resp 18 | Ht 73.0 in | Wt 184.5 lb

## 2014-02-26 DIAGNOSIS — T451X5A Adverse effect of antineoplastic and immunosuppressive drugs, initial encounter: Secondary | ICD-10-CM

## 2014-02-26 DIAGNOSIS — C9000 Multiple myeloma not having achieved remission: Secondary | ICD-10-CM | POA: Diagnosis not present

## 2014-02-26 DIAGNOSIS — Z981 Arthrodesis status: Secondary | ICD-10-CM | POA: Insufficient documentation

## 2014-02-26 DIAGNOSIS — R05 Cough: Secondary | ICD-10-CM | POA: Insufficient documentation

## 2014-02-26 DIAGNOSIS — R509 Fever, unspecified: Secondary | ICD-10-CM | POA: Insufficient documentation

## 2014-02-26 DIAGNOSIS — Z86718 Personal history of other venous thrombosis and embolism: Secondary | ICD-10-CM

## 2014-02-26 DIAGNOSIS — R059 Cough, unspecified: Secondary | ICD-10-CM | POA: Diagnosis not present

## 2014-02-26 DIAGNOSIS — Z87898 Personal history of other specified conditions: Secondary | ICD-10-CM | POA: Diagnosis not present

## 2014-02-26 DIAGNOSIS — D6481 Anemia due to antineoplastic chemotherapy: Secondary | ICD-10-CM | POA: Diagnosis not present

## 2014-02-26 DIAGNOSIS — Z7901 Long term (current) use of anticoagulants: Secondary | ICD-10-CM

## 2014-02-26 DIAGNOSIS — E859 Amyloidosis, unspecified: Secondary | ICD-10-CM | POA: Diagnosis not present

## 2014-02-26 LAB — BASIC METABOLIC PANEL (CC13)
Anion Gap: 9 mEq/L (ref 3–11)
BUN: 21.9 mg/dL (ref 7.0–26.0)
CO2: 26 mEq/L (ref 22–29)
Calcium: 9.2 mg/dL (ref 8.4–10.4)
Chloride: 106 mEq/L (ref 98–109)
Creatinine: 1.4 mg/dL — ABNORMAL HIGH (ref 0.7–1.3)
Glucose: 99 mg/dl (ref 70–140)
Potassium: 4 mEq/L (ref 3.5–5.1)
Sodium: 140 mEq/L (ref 136–145)

## 2014-02-26 LAB — CBC WITH DIFFERENTIAL/PLATELET
BASO%: 0.2 % (ref 0.0–2.0)
Basophils Absolute: 0 10*3/uL (ref 0.0–0.1)
EOS%: 0.6 % (ref 0.0–7.0)
Eosinophils Absolute: 0 10*3/uL (ref 0.0–0.5)
HCT: 37.4 % — ABNORMAL LOW (ref 38.4–49.9)
HGB: 12.5 g/dL — ABNORMAL LOW (ref 13.0–17.1)
LYMPH%: 17.7 % (ref 14.0–49.0)
MCH: 32.1 pg (ref 27.2–33.4)
MCHC: 33.4 g/dL (ref 32.0–36.0)
MCV: 96.1 fL (ref 79.3–98.0)
MONO#: 1 10*3/uL — ABNORMAL HIGH (ref 0.1–0.9)
MONO%: 17.7 % — ABNORMAL HIGH (ref 0.0–14.0)
NEUT#: 3.4 10*3/uL (ref 1.5–6.5)
NEUT%: 63.8 % (ref 39.0–75.0)
Platelets: 122 10*3/uL — ABNORMAL LOW (ref 140–400)
RBC: 3.89 10*6/uL — ABNORMAL LOW (ref 4.20–5.82)
RDW: 13.8 % (ref 11.0–14.6)
WBC: 5.4 10*3/uL (ref 4.0–10.3)
lymph#: 1 10*3/uL (ref 0.9–3.3)

## 2014-02-26 NOTE — Progress Notes (Signed)
[facility] OFFICE PROGRESS NOTE   Diagnosis: Multiple myeloma-amyloidosis  INTERVAL HISTORY:   He returns as scheduled. He continues to bruise easily. He complains of malaise and a cough for the past week. He continues anticoagulation. Brent Allison is scheduled for a followup appointment at [facility] next month.  Objective:  Vital signs in last 24 hours:  Blood pressure 122/56, pulse 95, temperature 99.8 F (37.7 C), temperature source Oral, resp. rate 18, height 6' 1" (1.854 m), weight 184 lb 8 oz (83.689 kg), SpO2 98.00%.    HEENT: Macroglossia, ecchymosis under the tongue Resp: Lungs clear bilaterally Cardio: Regular rate and rhythm GI: No hepatosplenomegaly Vascular: Trace right lower leg edema Neuro: The motor exam appears intact in the arms and hands bilaterally      Lab Results:  Lab Results  Component Value Date   WBC 5.4 02/26/2014   HGB 12.5* 02/26/2014   HCT 37.4* 02/26/2014   MCV 96.1 02/26/2014   PLT 122* 02/26/2014   NEUTROABS 3.4 02/26/2014   potassium 4.0, creatinine 1.4, BUN 21.9  Free lambda light chains on 12/13/2013-2.68 Imaging:  Dg Chest 1 View  02/26/2014   CLINICAL DATA:  History myeloma.  Dry cough.  Low-grade fever.  EXAM: CHEST - 1 VIEW  COMPARISON:  12/12/2012  FINDINGS: Cardiac silhouette is normal in size. Normal mediastinal and hilar contours.  Clear lungs. No pleural effusion or pneumothorax. The bony thorax is intact. There are changes from a previous cervical spine fusion, stable.  IMPRESSION: No active disease.   Electronically Signed   By: Lajean Manes M.D.   On: 02/26/2014 11:58    Medications: I have reviewed the patient's current medications.  Assessment/Plan: 1. 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 2. "Bruising" at the eyelids and mouth: Likely related to amyloidosis, persistent. 3. Numbness and loss of vibratory sense at the fingertips: This predated Velcade-based therapy, but worsened. Now much improved following  cervical spine surgery. 4. Elevated serum free lambda light chains. The lambda light chains were lower on November 16 and slightly higher on 12/14/2011. 5. Lambda light chain proteinuria. 6. Bone marrow plasmacytosis: Variable increase in plasma cells noted on the bone marrow biopsy 07/29/2011 with plasma cells estimated to represent between 4% and 20% of the cellular population. 7. Remote history of prostate cancer. 8. Sleep apnea. 9. Dyslipidemia. 10. Report of pneumonia on 2 occasions in 2011. 11. Admission to a hospital in [place], [place] October 2012 with "pneumonia." A chest x-ray at [facility] on November 2 was negative .  12. Low serum immunoglobulin G level. 13. Plasma cell dyscrasia with associated amyloidosis.  a. Initiation of systemic therapy with Cytoxan, Velcade, and Decadron 08/12/2011. Cycle #2 was initiated on 09/16/2011. Cycle #3 was initiated on 10/15/2011. Velcade was placed on hold due to neuropathy. b. The serum free lambda light chains were decreased on October 08 2011. c. The serum free lambda light chains were slightly increased on 12/14/2011, lower on 12/29/2011 and 02/02/2012. d. Initiation of Revlimid/Decadron February 2013, cycle 2 started on 01/28/2012. e. High-dose melphalan chemotherapy followed by autologous stem cell infusion on 02/09/2013 f. Initiation of Velcade/Decadrontherapy 05/28/2013, he completed 2 cycles on a day 1, 4, 8, 11 schedule g. Improvement in the serum free lambda light chains following induction Velcade/Decadron  h. Initiation of weekly Velcade/Decadron on 07/16/2013, last treatment on 10/16/2013 i. Improvement in the serum free lambda light chains on 08/21/2013 and 09/18/2013 [facility]) j. Normal serum free lambda light chains on 12/13/2013 15. Loss of "  balance "and proximal motor weakness secondary to cervical stenosis-status post decompression surgery on 02/19/2012. Improved, but not resolved stenosis with mass effect on the spinal  cord noted on a repeat MRI 03/28/2012. He underwent further decompression surgery on 04/06/2012. The ataxia, weakness, and peripheral numbness is much improved.  16. Right lower Extremity deep vein thrombosis 02/02/2012-a Doppler ultrasound confirmed a gastrocnemius and peroneal deep vein thrombosis. An IVC filter was placed prior to surgery and he was maintained on Lovenox. The IVC filter was removed on 05/04/2012.  17. Anorexia following cervical spine surgery. Improved.  18. Episode of flank pain and hematuria in 03/31/2012-etiology unclear. He completed a course of antibiotics. The hematuria and pain have resolved.  19. Rectal wall thickening noted on a CT of the pelvis 03/31/2012.  20. colonoscopy showed an area of abnormality at the rectum with biopsy positive for amyloid.  21. Lung nodule noted on the CT 03/31/2012 , no lung nodule on a chest CT 07/30/2013  22. Lumbar stenosis-status post surgery on 07/31/2012.  23. Status post stem cell collection.  24.Rright internal jugular thrombosis, 10/22/2012, likely related to pheresis catheter and HIT  25. Admission with acute right pulmonary embolism 10/27/2012  26. Superior mesenteric vein/portal vein thrombosis diagnosed on a CT of the abdomen 10/28/2012 and 10/29/2012-treated with Argatroban , maintained on Coumadin anticoagulation until he hospital admission March 2014 for stem cell therapy  27. HIT confirmed on a serotonin release assay 10/29/2012  28. History ofRenal insufficiency during the December 2013 hospital admission-likely related to dehydration and? Contrast nephropathy , persistent mild elevation of the creatinine  29. Dysphagia-? Secondary to amyloidosis, reported when he was here on 05/08/2013. He did not complain of dysphagia today  30. Anemia secondary to multiple myeloma and chemotherapy , improved  31.right lower Extremity deep vein thrombosis diagnosed at [facility] July 2014 , repeat Doppler at [facility] 07/19/2013 with chronic thrombus in  the right gastrocnemius, maintained on Eliquis  32. Fall August 2014 with a left shoulder injury, diagnosed with a rotator cuff tear, status post surgical repair 09/19/2013  33. 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 The neuropathy symptoms have partially improved sense of Velcade was discontinued after treatment 10/16/2013  34.cough, malaise--negative chest x-ray, he likely has a viral URI    Disposition:  We decided to hold the scheduled vaccinations today secondary to the current upper respiratory infection. I suspect he has a viral URI. He will contact us for shortness of breath or persistent symptoms.  We will followup on the lambda light chains from today. He is scheduled for an appointment with Dr. Melba Coon next month.  I will at the [facility] team to make recommendations anticoagulation therapy.  Betsy Coder, MD  02/26/2014  5:25 PM

## 2014-02-26 NOTE — Telephone Encounter (Signed)
gave pt appt for lab and MD for MAy 2015 2-D echo needs precert informed Brent Allison

## 2014-02-27 ENCOUNTER — Telehealth: Payer: Self-pay | Admitting: *Deleted

## 2014-02-27 ENCOUNTER — Other Ambulatory Visit: Payer: Self-pay | Admitting: *Deleted

## 2014-02-27 LAB — KAPPA/LAMBDA LIGHT CHAINS
Kappa free light chain: 1.93 mg/dL (ref 0.33–1.94)
Kappa:Lambda Ratio: 0.58 (ref 0.26–1.65)
Lambda Free Lght Chn: 3.3 mg/dL — ABNORMAL HIGH (ref 0.57–2.63)

## 2014-02-27 NOTE — Telephone Encounter (Signed)
Message copied by Tania Ade on Wed Feb 27, 2014  5:13 PM ------      Message from: Ladell Pier      Created: Tue Feb 26, 2014  5:50 PM       Please call patient, cxr is negative ------

## 2014-02-27 NOTE — Telephone Encounter (Signed)
Notified patient that CXR was negative. He reports he is already feeling better.

## 2014-02-28 ENCOUNTER — Telehealth: Payer: Self-pay | Admitting: Oncology

## 2014-02-28 NOTE — Telephone Encounter (Signed)
s.w. pt and advised on 4.22 inj...pt ok and aware

## 2014-03-13 ENCOUNTER — Ambulatory Visit (HOSPITAL_BASED_OUTPATIENT_CLINIC_OR_DEPARTMENT_OTHER): Payer: Medicare Other

## 2014-03-13 VITALS — BP 113/59 | HR 80 | Temp 98.2°F

## 2014-03-13 DIAGNOSIS — Z23 Encounter for immunization: Secondary | ICD-10-CM

## 2014-03-13 DIAGNOSIS — C9001 Multiple myeloma in remission: Secondary | ICD-10-CM

## 2014-03-13 MED ORDER — DTAP-HEPATITIS B RECOMB-IPV IM SUSP
0.5000 mL | Freq: Once | INTRAMUSCULAR | Status: AC
  Administered 2014-03-13: 0.5 mL via INTRAMUSCULAR
  Filled 2014-03-13: qty 0.5

## 2014-03-13 MED ORDER — HAEMOPHILUS B POLYSAC CONJ VAC IM SOLR
0.5000 mL | Freq: Once | INTRAMUSCULAR | Status: AC
  Administered 2014-03-13: 0.5 mL via INTRAMUSCULAR
  Filled 2014-03-13: qty 0.5

## 2014-03-13 MED ORDER — PNEUMOCOCCAL 13-VAL CONJ VACC IM SUSP
0.5000 mL | Freq: Once | INTRAMUSCULAR | Status: AC
  Administered 2014-03-13: 0.5 mL via INTRAMUSCULAR
  Filled 2014-03-13: qty 0.5

## 2014-03-14 ENCOUNTER — Other Ambulatory Visit: Payer: Self-pay | Admitting: *Deleted

## 2014-03-14 DIAGNOSIS — C9000 Multiple myeloma not having achieved remission: Secondary | ICD-10-CM

## 2014-03-14 MED ORDER — APIXABAN 5 MG PO TABS: 2.5000 mg | ORAL_TABLET | Freq: Two times a day (BID) | ORAL | Status: DC

## 2014-03-14 MED ORDER — APIXABAN 2.5 MG PO TABS: 2.5000 mg | ORAL_TABLET | Freq: Two times a day (BID) | ORAL | Status: DC

## 2014-03-14 NOTE — Telephone Encounter (Signed)
Message from pt asking if he should continue Eliquis. States Dr. [name] was going to discuss with Drs. Voorhees and Coghill. Reviewed with Dr. Learta Codding, order received for refill. Per MD: waiting to hear from them after pt's next visit there. Pt made aware. He confirms he is taking Eliquis 2.5 mg BID.

## 2014-04-02 DIAGNOSIS — C9000 Multiple myeloma not having achieved remission: Secondary | ICD-10-CM | POA: Diagnosis not present

## 2014-04-02 DIAGNOSIS — Z86718 Personal history of other venous thrombosis and embolism: Secondary | ICD-10-CM | POA: Diagnosis not present

## 2014-04-02 DIAGNOSIS — Z48298 Encounter for aftercare following other organ transplant: Secondary | ICD-10-CM | POA: Diagnosis not present

## 2014-04-02 DIAGNOSIS — Z86711 Personal history of pulmonary embolism: Secondary | ICD-10-CM | POA: Diagnosis not present

## 2014-04-02 DIAGNOSIS — E859 Amyloidosis, unspecified: Secondary | ICD-10-CM | POA: Diagnosis not present

## 2014-04-02 DIAGNOSIS — Z9484 Stem cells transplant status: Secondary | ICD-10-CM | POA: Diagnosis not present

## 2014-04-03 ENCOUNTER — Other Ambulatory Visit (HOSPITAL_BASED_OUTPATIENT_CLINIC_OR_DEPARTMENT_OTHER): Payer: Medicare Other

## 2014-04-03 ENCOUNTER — Ambulatory Visit (HOSPITAL_BASED_OUTPATIENT_CLINIC_OR_DEPARTMENT_OTHER): Payer: Medicare Other | Admitting: Oncology

## 2014-04-03 ENCOUNTER — Other Ambulatory Visit: Payer: Self-pay | Admitting: *Deleted

## 2014-04-03 ENCOUNTER — Telehealth: Payer: Self-pay | Admitting: Oncology

## 2014-04-03 VITALS — BP 113/63 | HR 80 | Temp 97.1°F | Resp 18 | Ht 73.0 in | Wt 187.7 lb

## 2014-04-03 DIAGNOSIS — T451X5A Adverse effect of antineoplastic and immunosuppressive drugs, initial encounter: Secondary | ICD-10-CM | POA: Diagnosis not present

## 2014-04-03 DIAGNOSIS — E859 Amyloidosis, unspecified: Secondary | ICD-10-CM

## 2014-04-03 DIAGNOSIS — D6481 Anemia due to antineoplastic chemotherapy: Secondary | ICD-10-CM | POA: Diagnosis not present

## 2014-04-03 DIAGNOSIS — I82409 Acute embolism and thrombosis of unspecified deep veins of unspecified lower extremity: Secondary | ICD-10-CM | POA: Diagnosis not present

## 2014-04-03 DIAGNOSIS — D630 Anemia in neoplastic disease: Secondary | ICD-10-CM | POA: Diagnosis not present

## 2014-04-03 DIAGNOSIS — Z8546 Personal history of malignant neoplasm of prostate: Secondary | ICD-10-CM

## 2014-04-03 DIAGNOSIS — C9000 Multiple myeloma not having achieved remission: Secondary | ICD-10-CM

## 2014-04-03 DIAGNOSIS — G609 Hereditary and idiopathic neuropathy, unspecified: Secondary | ICD-10-CM | POA: Diagnosis not present

## 2014-04-03 LAB — BASIC METABOLIC PANEL (CC13)
Anion Gap: 11 mEq/L (ref 3–11)
BUN: 22.8 mg/dL (ref 7.0–26.0)
CO2: 24 mEq/L (ref 22–29)
Calcium: 9.8 mg/dL (ref 8.4–10.4)
Chloride: 107 mEq/L (ref 98–109)
Creatinine: 1.4 mg/dL — ABNORMAL HIGH (ref 0.7–1.3)
Glucose: 97 mg/dl (ref 70–140)
Potassium: 4.1 mEq/L (ref 3.5–5.1)
Sodium: 142 mEq/L (ref 136–145)

## 2014-04-03 LAB — CBC WITH DIFFERENTIAL/PLATELET
BASO%: 0.5 % (ref 0.0–2.0)
Basophils Absolute: 0 10*3/uL (ref 0.0–0.1)
EOS%: 3.9 % (ref 0.0–7.0)
Eosinophils Absolute: 0.2 10*3/uL (ref 0.0–0.5)
HCT: 36.8 % — ABNORMAL LOW (ref 38.4–49.9)
HGB: 12.2 g/dL — ABNORMAL LOW (ref 13.0–17.1)
LYMPH%: 18.6 % (ref 14.0–49.0)
MCH: 31.9 pg (ref 27.2–33.4)
MCHC: 33.2 g/dL (ref 32.0–36.0)
MCV: 96.1 fL (ref 79.3–98.0)
MONO#: 1 10*3/uL — ABNORMAL HIGH (ref 0.1–0.9)
MONO%: 16.3 % — ABNORMAL HIGH (ref 0.0–14.0)
NEUT#: 3.6 10*3/uL (ref 1.5–6.5)
NEUT%: 60.7 % (ref 39.0–75.0)
Platelets: 150 10*3/uL (ref 140–400)
RBC: 3.83 10*6/uL — ABNORMAL LOW (ref 4.20–5.82)
RDW: 14.4 % (ref 11.0–14.6)
WBC: 6 10*3/uL (ref 4.0–10.3)
lymph#: 1.1 10*3/uL (ref 0.9–3.3)

## 2014-04-03 NOTE — Progress Notes (Signed)
[place] OFFICE PROGRESS NOTE   Diagnosis: Multiple myeloma/amyloidosis  INTERVAL HISTORY:   He returns as scheduled. He reports malaise. He continues anticoagulation. Stable pruritus at the eyes. Multiple ecchymoses. He was seen at [facility] yesterday and is waiting to hear their recommendation regarding anticoagulation therapy.  Objective:  Vital signs in last 24 hours:  Blood pressure 113/63, pulse 80, temperature 97.1 F (36.2 C), temperature source Oral, resp. rate 18, height 6' 1"  (1.854 m), weight 187 lb 11.2 oz (85.14 kg), SpO2 100.00%.    HEENT: Periorbital ecchymoses, macroglossia Resp: Lungs clear bilaterally Cardio: Regular rate and rhythm GI: No hepatosplenomegaly Vascular: Trace edema at the right lower leg   Lab Results:  Lab Results  Component Value Date   WBC 6.0 04/03/2014   HGB 12.2* 04/03/2014   HCT 36.8* 04/03/2014   MCV 96.1 04/03/2014   PLT 150 04/03/2014   NEUTROABS 3.6 04/03/2014   Potassium 4.1, BUN 22.8, creatinine 1.4  Laboratory light chains on 02/26/2014-3.3    Medications: I have reviewed the patient's current medications.  Assessment/Plan: 1. 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 2. "Bruising" at the eyelids and mouth: Likely related to amyloidosis, persistent. 3. Numbness and loss of vibratory sense at the fingertips: This predated Velcade-based therapy, but worsened. Now much improved following cervical spine surgery. 4. Elevated serum free lambda light chains. The lambda light chains were lower on November 16 and slightly higher on 12/14/2011. 5. Lambda light chain proteinuria. 6. Bone marrow plasmacytosis: Variable increase in plasma cells noted on the bone marrow biopsy 07/29/2011 with plasma cells estimated to represent between 4% and 20% of the cellular population. 7. Remote history of prostate cancer. 8. Sleep apnea. 9. Dyslipidemia. 10. Report of pneumonia on 2 occasions in 2011. 11. Admission to a hospital  in [place], [place] October 2012 with "pneumonia." A chest x-ray at [facility] on November 2 was negative .  12. Low serum immunoglobulin G level. 13. Plasma cell dyscrasia with associated amyloidosis.  a. Initiation of systemic therapy with Cytoxan, Velcade, and Decadron 08/12/2011. Cycle #2 was initiated on 09/16/2011. Cycle #3 was initiated on 10/15/2011. Velcade was placed on hold due to neuropathy. b. The serum free lambda light chains were decreased on October 08 2011. c. The serum free lambda light chains were slightly increased on 12/14/2011, lower on 12/29/2011 and 02/02/2012. d. Initiation of Revlimid/Decadron February 2013, cycle 2 started on 01/28/2012. e. High-dose melphalan chemotherapy followed by autologous stem cell infusion on 02/09/2013 f. Initiation of Velcade/Decadrontherapy 05/28/2013, he completed 2 cycles on a day 1, 4, 8, 11 schedule g. Improvement in the serum free lambda light chains following induction Velcade/Decadron  h. Initiation of weekly Velcade/Decadron on 07/16/2013, last treatment on 10/16/2013 i. Improvement in the serum free lambda light chains on 08/21/2013 and 09/18/2013 [facility]) j. Normal serum free lambda light chains on 12/13/2013 k. Mild increase of the serum free lambda light chains 02/26/2014 15. Loss of "balance "and proximal motor weakness secondary to cervical stenosis-status post decompression surgery on 02/19/2012. Improved, but not resolved stenosis with mass effect on the spinal cord noted on a repeat MRI 03/28/2012. He underwent further decompression surgery on 04/06/2012. The ataxia, weakness, and peripheral numbness is much improved.  16. Right lower Extremity deep vein thrombosis 02/02/2012-a Doppler ultrasound confirmed a gastrocnemius and peroneal deep vein thrombosis. An IVC filter was placed prior to surgery and he was maintained on Lovenox. The IVC filter was removed on 05/04/2012.  17. Anorexia following cervical spine surgery.  Improved.  18. Episode of flank pain and hematuria in 03/31/2012-etiology unclear. He completed a course of antibiotics. The hematuria and pain have resolved.  19. Rectal wall thickening noted on a CT of the pelvis 03/31/2012.  20. colonoscopy showed an area of abnormality at the rectum with biopsy positive for amyloid.  21. Lung nodule noted on the CT 03/31/2012 , no lung nodule on a chest CT 07/30/2013  22. Lumbar stenosis-status post surgery on 07/31/2012.  23. Status post stem cell collection.  24.Rright internal jugular thrombosis, 10/22/2012, likely related to pheresis catheter and HIT  25. Admission with acute right pulmonary embolism 10/27/2012  26. Superior mesenteric vein/portal vein thrombosis diagnosed on a CT of the abdomen 10/28/2012 and 10/29/2012-treated with Argatroban , maintained on Coumadin anticoagulation until he hospital admission March 2014 for stem cell therapy  27. HIT confirmed on a serotonin release assay 10/29/2012  28. History of Renal insufficiency during the December 2013 hospital admission-likely related to dehydration and? Contrast nephropathy , persistent mild elevation of the creatinine  29. Dysphagia-? Secondary to amyloidosis, reported when he was here on 05/08/2013. He did not complain of dysphagia today  30. Anemia secondary to multiple myeloma and chemotherapy , improved  31.right lower Extremity deep vein thrombosis diagnosed at [facility] July 2014 , repeat Doppler at [facility] 07/19/2013 with chronic thrombus in the right gastrocnemius, maintained on Eliquis  32. Fall August 2014 with a left shoulder injury, diagnosed with a rotator cuff tear, status post surgical repair 09/19/2013  33. 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 The neuropathy symptoms have partially improved sense of Velcade was discontinued after treatment 10/16/2013      Disposition:  He appears unchanged. He remains off of specific therapy for multiple  myeloma/amyloidosis. He will complete vaccination therapy per the [facility] transplant protocol. We are awaiting the opinion of the [facility] anticoagulation service regarding the continuation of Eliquis.  We will followup on the lambda free light chains from today. He will return for an office and lab visit 06/11/2014.  Ladell Pier, MD  04/03/2014  10:24 AM

## 2014-04-03 NOTE — Telephone Encounter (Signed)
Damian Leavell pt appt for labs Md and MD for July and pt has appt for 2-D echo, called Vaughan Basta to check for pre authoriziation

## 2014-04-03 NOTE — Telephone Encounter (Signed)
Pt requested vaccination appts to be scheduled. Due 05/12/14. Order sent to schedulers for injection appt.

## 2014-04-04 ENCOUNTER — Ambulatory Visit (HOSPITAL_COMMUNITY)
Admission: RE | Admit: 2014-04-04 | Discharge: 2014-04-04 | Disposition: A | Discharge status: Home / Self Care | Payer: Medicare Other | Source: Ambulatory Visit | Attending: Oncology | Admitting: Oncology

## 2014-04-04 ENCOUNTER — Telehealth: Payer: Self-pay | Admitting: Oncology

## 2014-04-04 DIAGNOSIS — I517 Cardiomegaly: Secondary | ICD-10-CM | POA: Insufficient documentation

## 2014-04-04 DIAGNOSIS — E8589 Other amyloidosis: Secondary | ICD-10-CM | POA: Insufficient documentation

## 2014-04-04 DIAGNOSIS — E119 Type 2 diabetes mellitus without complications: Secondary | ICD-10-CM | POA: Diagnosis not present

## 2014-04-04 DIAGNOSIS — E785 Hyperlipidemia, unspecified: Secondary | ICD-10-CM | POA: Insufficient documentation

## 2014-04-04 DIAGNOSIS — C9000 Multiple myeloma not having achieved remission: Secondary | ICD-10-CM

## 2014-04-04 DIAGNOSIS — E889 Metabolic disorder, unspecified: Secondary | ICD-10-CM

## 2014-04-04 DIAGNOSIS — I43 Cardiomyopathy in diseases classified elsewhere: Secondary | ICD-10-CM

## 2014-04-04 DIAGNOSIS — E639 Nutritional deficiency, unspecified: Secondary | ICD-10-CM | POA: Insufficient documentation

## 2014-04-04 LAB — KAPPA/LAMBDA LIGHT CHAINS
Kappa free light chain: 1.9 mg/dL (ref 0.33–1.94)
Kappa:Lambda Ratio: 0.6 (ref 0.26–1.65)
Lambda Free Lght Chn: 3.18 mg/dL — ABNORMAL HIGH (ref 0.57–2.63)

## 2014-04-04 NOTE — Telephone Encounter (Signed)
s.w.l pt and advised on 6.22.15 appt....pt changed it to 6.23...done...pt aware of new d.t

## 2014-04-05 ENCOUNTER — Encounter: Payer: Self-pay | Admitting: *Deleted

## 2014-05-13 ENCOUNTER — Ambulatory Visit: Payer: Medicare Other

## 2014-05-14 ENCOUNTER — Ambulatory Visit (HOSPITAL_BASED_OUTPATIENT_CLINIC_OR_DEPARTMENT_OTHER): Payer: Medicare Other

## 2014-05-14 VITALS — BP 102/62 | HR 62 | Temp 98.2°F

## 2014-05-14 DIAGNOSIS — C9001 Multiple myeloma in remission: Secondary | ICD-10-CM

## 2014-05-14 DIAGNOSIS — Z23 Encounter for immunization: Secondary | ICD-10-CM

## 2014-05-14 MED ORDER — DTAP-HEPATITIS B RECOMB-IPV IM SUSP
0.5000 mL | Freq: Once | INTRAMUSCULAR | Status: AC
  Administered 2014-05-14: 0.5 mL via INTRAMUSCULAR
  Filled 2014-05-14: qty 0.5

## 2014-05-14 MED ORDER — PNEUMOCOCCAL 13-VAL CONJ VACC IM SUSP
0.5000 mL | INTRAMUSCULAR | Status: DC
  Filled 2014-05-14: qty 0.5

## 2014-05-14 MED ORDER — HAEMOPHILUS B POLYSAC CONJ VAC IM SOLR
0.5000 mL | Freq: Once | INTRAMUSCULAR | Status: AC
  Administered 2014-05-14: 0.5 mL via INTRAMUSCULAR
  Filled 2014-05-14: qty 0.5

## 2014-05-14 MED ORDER — PNEUMOCOCCAL 13-VAL CONJ VACC IM SUSP
0.5000 mL | Freq: Once | INTRAMUSCULAR | Status: AC
  Administered 2014-05-14: 0.5 mL via INTRAMUSCULAR
  Filled 2014-05-14: qty 0.5

## 2014-06-11 ENCOUNTER — Telehealth: Payer: Self-pay | Admitting: Oncology

## 2014-06-11 ENCOUNTER — Ambulatory Visit (HOSPITAL_BASED_OUTPATIENT_CLINIC_OR_DEPARTMENT_OTHER): Payer: Medicare Other | Admitting: Oncology

## 2014-06-11 ENCOUNTER — Other Ambulatory Visit (HOSPITAL_BASED_OUTPATIENT_CLINIC_OR_DEPARTMENT_OTHER): Payer: Medicare Other

## 2014-06-11 VITALS — BP 109/63 | HR 60 | Temp 97.8°F | Resp 18 | Ht 73.0 in | Wt 188.3 lb

## 2014-06-11 DIAGNOSIS — C9000 Multiple myeloma not having achieved remission: Secondary | ICD-10-CM | POA: Diagnosis not present

## 2014-06-11 DIAGNOSIS — Z7901 Long term (current) use of anticoagulants: Secondary | ICD-10-CM

## 2014-06-11 DIAGNOSIS — I82409 Acute embolism and thrombosis of unspecified deep veins of unspecified lower extremity: Secondary | ICD-10-CM | POA: Diagnosis not present

## 2014-06-11 DIAGNOSIS — Z8546 Personal history of malignant neoplasm of prostate: Secondary | ICD-10-CM

## 2014-06-11 DIAGNOSIS — E859 Amyloidosis, unspecified: Secondary | ICD-10-CM | POA: Diagnosis not present

## 2014-06-11 LAB — BASIC METABOLIC PANEL (CC13)
Anion Gap: 7 mEq/L (ref 3–11)
BUN: 23.6 mg/dL (ref 7.0–26.0)
CO2: 25 mEq/L (ref 22–29)
Calcium: 9.5 mg/dL (ref 8.4–10.4)
Chloride: 108 mEq/L (ref 98–109)
Creatinine: 1.4 mg/dL — ABNORMAL HIGH (ref 0.7–1.3)
Glucose: 97 mg/dl (ref 70–140)
Potassium: 4.4 mEq/L (ref 3.5–5.1)
Sodium: 140 mEq/L (ref 136–145)

## 2014-06-11 LAB — CBC WITH DIFFERENTIAL/PLATELET
BASO%: 0.5 % (ref 0.0–2.0)
Basophils Absolute: 0 10*3/uL (ref 0.0–0.1)
EOS%: 2.8 % (ref 0.0–7.0)
Eosinophils Absolute: 0.2 10*3/uL (ref 0.0–0.5)
HCT: 37.5 % — ABNORMAL LOW (ref 38.4–49.9)
HGB: 12.5 g/dL — ABNORMAL LOW (ref 13.0–17.1)
LYMPH%: 22.9 % (ref 14.0–49.0)
MCH: 32 pg (ref 27.2–33.4)
MCHC: 33.3 g/dL (ref 32.0–36.0)
MCV: 95.9 fL (ref 79.3–98.0)
MONO#: 0.8 10*3/uL (ref 0.1–0.9)
MONO%: 11.9 % (ref 0.0–14.0)
NEUT#: 4 10*3/uL (ref 1.5–6.5)
NEUT%: 61.9 % (ref 39.0–75.0)
Platelets: 152 10*3/uL (ref 140–400)
RBC: 3.91 10*6/uL — ABNORMAL LOW (ref 4.20–5.82)
RDW: 13.9 % (ref 11.0–14.6)
WBC: 6.5 10*3/uL (ref 4.0–10.3)
lymph#: 1.5 10*3/uL (ref 0.9–3.3)

## 2014-06-11 NOTE — Telephone Encounter (Signed)
gv adn rpinted aptp sched and avs for pt for OCT.Marland Kitchen

## 2014-06-11 NOTE — Progress Notes (Signed)
[place] OFFICE PROGRESS NOTE   Diagnosis: Multiple myeloma/amyloidosis  INTERVAL HISTORY:   She returns as scheduled. He continues to bruise easily. He reports malaise. No new complaint  Objective:  Vital signs in last 24 hours:  Blood pressure 109/63, pulse 60, temperature 97.8 F (36.6 C), temperature source Oral, resp. rate 18, height 6' 1" (1.854 m), weight 188 lb 4.8 oz (85.412 kg), SpO2 100.00%.    HEENT: Macroglossia, no bruising in the mouth Resp: Lungs clear bilaterally Cardio: Regular rate and rhythm GI: No hepatosplenomegaly, nontender Vascular: Trace edema at the right lower leg  Skin: Minimal bruising and skin thickening at the fingers     Lab Results:  Lab Results  Component Value Date   WBC 6.5 06/11/2014   HGB 12.5* 06/11/2014   HCT 37.5* 06/11/2014   MCV 95.9 06/11/2014   PLT 152 06/11/2014   NEUTROABS 4.0 06/11/2014   Potassium 4.4, creatinine 1.4, BUN 23.6  04/03/2014-kappa free light chains 1.9, lambda free light chains 3.18    Medications: I have reviewed the patient's current medications.  Assessment/Plan: 1. 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 2. "Bruising" at the eyelids and mouth: Likely related to amyloidosis, persistent. 3. Numbness and loss of vibratory sense at the fingertips: This predated Velcade-based therapy, but worsened. Now much improved following cervical spine surgery. 4. Elevated serum free lambda light chains. The lambda light chains were lower on November 16 and slightly higher on 12/14/2011. 5. Lambda light chain proteinuria. 6. Bone marrow plasmacytosis: Variable increase in plasma cells noted on the bone marrow biopsy 07/29/2011 with plasma cells estimated to represent between 4% and 20% of the cellular population. 7. Remote history of prostate cancer. 8. Sleep apnea. 9. Dyslipidemia. 10. Report of pneumonia on 2 occasions in 2011. 11. Admission to a hospital in [place], [place] October  2012 with "pneumonia." A chest x-ray at [facility] on November 2 was negative .  12. Low serum immunoglobulin G level. 13. Plasma cell dyscrasia with associated amyloidosis.  a. Initiation of systemic therapy with Cytoxan, Velcade, and Decadron 08/12/2011. Cycle #2 was initiated on 09/16/2011. Cycle #3 was initiated on 10/15/2011. Velcade was placed on hold due to neuropathy. b. The serum free lambda light chains were decreased on October 08 2011. c. The serum free lambda light chains were slightly increased on 12/14/2011, lower on 12/29/2011 and 02/02/2012. d. Initiation of Revlimid/Decadron February 2013, cycle 2 started on 01/28/2012. e. High-dose melphalan chemotherapy followed by autologous stem cell infusion on 02/09/2013 f. Initiation of Velcade/Decadrontherapy 05/28/2013, he completed 2 cycles on a day 1, 4, 8, 11 schedule g. Improvement in the serum free lambda light chains following induction Velcade/Decadron  h. Initiation of weekly Velcade/Decadron on 07/16/2013, last treatment on 10/16/2013 i. Improvement in the serum free lambda light chains on 08/21/2013 and 09/18/2013 [facility]) j. Normal serum free lambda light chains on 12/13/2013 k. Mild increase of the serum free lambda light chains 02/26/2014, stable 04/03/2014 15. Loss of "balance "and proximal motor weakness secondary to cervical stenosis-status post decompression surgery on 02/19/2012. Improved, but not resolved stenosis with mass effect on the spinal cord noted on a repeat MRI 03/28/2012. He underwent further decompression surgery on 04/06/2012. The ataxia, weakness, and peripheral numbness is much improved.  16. Right lower Extremity deep vein thrombosis 02/02/2012-a Doppler ultrasound confirmed a gastrocnemius and peroneal deep vein thrombosis. An IVC filter was placed prior to surgery and he was maintained on Lovenox. The IVC filter was removed on 05/04/2012.  17.  Anorexia following cervical spine surgery. Improved.  18.  Episode of flank pain and hematuria in 03/31/2012-etiology unclear. He completed a course of antibiotics. The hematuria and pain have resolved.  19. Rectal wall thickening noted on a CT of the pelvis 03/31/2012.  20. colonoscopy showed an area of abnormality at the rectum with biopsy positive for amyloid.  21. Lung nodule noted on the CT 03/31/2012 , no lung nodule on a chest CT 07/30/2013  22. Lumbar stenosis-status post surgery on 07/31/2012.  23. Status post stem cell collection.  24.Rright internal jugular thrombosis, 10/22/2012, likely related to pheresis catheter and HIT  25. Admission with acute right pulmonary embolism 10/27/2012  26. Superior mesenteric vein/portal vein thrombosis diagnosed on a CT of the abdomen 10/28/2012 and 10/29/2012-treated with Argatroban , maintained on Coumadin anticoagulation until he hospital admission March 2014 for stem cell therapy  27. HIT confirmed on a serotonin release assay 10/29/2012  28. History of Renal insufficiency during the December 2013 hospital admission-likely related to dehydration and? Contrast nephropathy , persistent mild elevation of the creatinine  29. Dysphagia-? Secondary to amyloidosis, reported when he was here on 05/08/2013. He did not complain of dysphagia today  30. Anemia secondary to multiple myeloma and chemotherapy , improved  31.right lower Extremity deep vein thrombosis diagnosed at [facility] July 2014 , repeat Doppler at [facility] 07/19/2013 with chronic thrombus in the right gastrocnemius, Eliquis has been discontinued 32. Fall August 2014 with a left shoulder injury, diagnosed with a rotator cuff tear, status post surgical repair 09/19/2013  33. 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 The neuropathy symptoms have partially improved since Velcade was discontinued after treatment 10/16/2013      Disposition:  He appears stable. He will remain off of specific therapy for the multiple myeloma/amyloidosis. Mr.  Borboa is scheduled to be seen at [facility] next month.. He will return for an office visit in 3 months. We will be sure he is completing the post transplant vaccinations.  Betsy Coder, MD  06/11/2014  1:56 PM

## 2014-06-12 ENCOUNTER — Other Ambulatory Visit: Payer: Self-pay | Admitting: *Deleted

## 2014-06-12 ENCOUNTER — Telehealth: Payer: Self-pay | Admitting: Oncology

## 2014-06-12 DIAGNOSIS — C9000 Multiple myeloma not having achieved remission: Secondary | ICD-10-CM

## 2014-06-12 LAB — KAPPA/LAMBDA LIGHT CHAINS
Kappa free light chain: 1.45 mg/dL (ref 0.33–1.94)
Kappa:Lambda Ratio: 0.61 (ref 0.26–1.65)
Lambda Free Lght Chn: 2.38 mg/dL (ref 0.57–2.63)

## 2014-06-12 NOTE — Telephone Encounter (Signed)
S/w the pt and he is aware of his injection appt.

## 2014-06-14 ENCOUNTER — Telehealth: Payer: Self-pay | Admitting: *Deleted

## 2014-06-14 NOTE — Telephone Encounter (Signed)
Per Dr. [name]; notified pt that light chains were normal.  Pt verbalized understanding and expressed appreciation for call.

## 2014-07-01 DIAGNOSIS — C61 Malignant neoplasm of prostate: Secondary | ICD-10-CM | POA: Diagnosis not present

## 2014-07-01 DIAGNOSIS — N529 Male erectile dysfunction, unspecified: Secondary | ICD-10-CM | POA: Diagnosis not present

## 2014-07-01 DIAGNOSIS — Z8546 Personal history of malignant neoplasm of prostate: Secondary | ICD-10-CM | POA: Diagnosis not present

## 2014-07-12 ENCOUNTER — Other Ambulatory Visit: Payer: Self-pay | Admitting: *Deleted

## 2014-07-12 DIAGNOSIS — C9000 Multiple myeloma not having achieved remission: Secondary | ICD-10-CM

## 2014-07-15 ENCOUNTER — Ambulatory Visit: Payer: Medicare Other

## 2014-07-15 ENCOUNTER — Ambulatory Visit (HOSPITAL_BASED_OUTPATIENT_CLINIC_OR_DEPARTMENT_OTHER): Payer: Medicare Other

## 2014-07-15 VITALS — BP 109/57 | HR 68 | Temp 98.0°F

## 2014-07-15 DIAGNOSIS — Z23 Encounter for immunization: Secondary | ICD-10-CM | POA: Diagnosis present

## 2014-07-15 DIAGNOSIS — C9000 Multiple myeloma not having achieved remission: Secondary | ICD-10-CM | POA: Diagnosis not present

## 2014-07-15 MED ORDER — PNEUMOCOCCAL 13-VAL CONJ VACC IM SUSP
0.5000 mL | Freq: Once | INTRAMUSCULAR | Status: AC
  Administered 2014-07-15: 0.5 mL via INTRAMUSCULAR
  Filled 2014-07-15: qty 0.5

## 2014-07-15 MED ORDER — DTAP-HEPATITIS B RECOMB-IPV IM SUSP
0.5000 mL | Freq: Once | INTRAMUSCULAR | Status: AC
  Administered 2014-07-15: 0.5 mL via INTRAMUSCULAR
  Filled 2014-07-15: qty 0.5

## 2014-07-15 MED ORDER — HAEMOPHILUS B POLYSAC CONJ VAC IM SOLR
0.5000 mL | Freq: Once | INTRAMUSCULAR | Status: AC
  Administered 2014-07-15: 0.5 mL via INTRAMUSCULAR
  Filled 2014-07-15: qty 0.5

## 2014-08-21 DIAGNOSIS — C9000 Multiple myeloma not having achieved remission: Secondary | ICD-10-CM | POA: Diagnosis not present

## 2014-09-03 ENCOUNTER — Telehealth: Payer: Self-pay | Admitting: Oncology

## 2014-09-03 ENCOUNTER — Other Ambulatory Visit (HOSPITAL_BASED_OUTPATIENT_CLINIC_OR_DEPARTMENT_OTHER): Payer: Medicare Other

## 2014-09-03 ENCOUNTER — Ambulatory Visit (HOSPITAL_BASED_OUTPATIENT_CLINIC_OR_DEPARTMENT_OTHER): Payer: Medicare Other | Admitting: Oncology

## 2014-09-03 VITALS — BP 107/60 | HR 69 | Temp 97.9°F | Resp 20 | Ht 73.0 in | Wt 194.4 lb

## 2014-09-03 DIAGNOSIS — C9000 Multiple myeloma not having achieved remission: Secondary | ICD-10-CM

## 2014-09-03 DIAGNOSIS — E859 Amyloidosis, unspecified: Secondary | ICD-10-CM | POA: Diagnosis not present

## 2014-09-03 DIAGNOSIS — D630 Anemia in neoplastic disease: Secondary | ICD-10-CM | POA: Diagnosis not present

## 2014-09-03 DIAGNOSIS — G629 Polyneuropathy, unspecified: Secondary | ICD-10-CM

## 2014-09-03 DIAGNOSIS — Z8546 Personal history of malignant neoplasm of prostate: Secondary | ICD-10-CM | POA: Diagnosis not present

## 2014-09-03 DIAGNOSIS — Z23 Encounter for immunization: Secondary | ICD-10-CM | POA: Diagnosis not present

## 2014-09-03 DIAGNOSIS — D6481 Anemia due to antineoplastic chemotherapy: Secondary | ICD-10-CM | POA: Diagnosis not present

## 2014-09-03 DIAGNOSIS — Z86718 Personal history of other venous thrombosis and embolism: Secondary | ICD-10-CM | POA: Diagnosis not present

## 2014-09-03 LAB — CBC WITH DIFFERENTIAL/PLATELET
BASO%: 0.5 % (ref 0.0–2.0)
Basophils Absolute: 0 10*3/uL (ref 0.0–0.1)
EOS%: 2.5 % (ref 0.0–7.0)
Eosinophils Absolute: 0.2 10*3/uL (ref 0.0–0.5)
HCT: 38.9 % (ref 38.4–49.9)
HGB: 12.9 g/dL — ABNORMAL LOW (ref 13.0–17.1)
LYMPH%: 24.9 % (ref 14.0–49.0)
MCH: 31.7 pg (ref 27.2–33.4)
MCHC: 33.2 g/dL (ref 32.0–36.0)
MCV: 95.6 fL (ref 79.3–98.0)
MONO#: 0.8 10*3/uL (ref 0.1–0.9)
MONO%: 12.9 % (ref 0.0–14.0)
NEUT#: 3.6 10*3/uL (ref 1.5–6.5)
NEUT%: 59.2 % (ref 39.0–75.0)
Platelets: 154 10*3/uL (ref 140–400)
RBC: 4.07 10*6/uL — ABNORMAL LOW (ref 4.20–5.82)
RDW: 13.8 % (ref 11.0–14.6)
WBC: 6.1 10*3/uL (ref 4.0–10.3)
lymph#: 1.5 10*3/uL (ref 0.9–3.3)

## 2014-09-03 LAB — BASIC METABOLIC PANEL (CC13)
Anion Gap: 9 mEq/L (ref 3–11)
BUN: 21.9 mg/dL (ref 7.0–26.0)
CO2: 25 mEq/L (ref 22–29)
Calcium: 9.7 mg/dL (ref 8.4–10.4)
Chloride: 108 mEq/L (ref 98–109)
Creatinine: 1.4 mg/dL — ABNORMAL HIGH (ref 0.7–1.3)
Glucose: 92 mg/dl (ref 70–140)
Potassium: 4.2 mEq/L (ref 3.5–5.1)
Sodium: 143 mEq/L (ref 136–145)

## 2014-09-03 MED ORDER — INFLUENZA VAC SPLIT QUAD 0.5 ML IM SUSY
0.5000 mL | PREFILLED_SYRINGE | Freq: Once | INTRAMUSCULAR | Status: AC
  Administered 2014-09-03: 0.5 mL via INTRAMUSCULAR
  Filled 2014-09-03: qty 0.5

## 2014-09-03 NOTE — Progress Notes (Signed)
[place] OFFICE PROGRESS NOTE   Diagnosis: Multiple myeloma, amyloidosis INTERVAL HISTORY:   He returns as scheduled. He feels well. No new complaint. Stable easy bruising. No difficulty with bowel function. Stable strength in the arms and legs. His appetite has improved. He was seen at [facility] a few weeks ago and remains off of specific therapy for the amyloidosis.  Objective:  Vital signs in last 24 hours:  Blood pressure 107/60, pulse 69, temperature 97.9 F (36.6 C), temperature source Oral, resp. rate 69, height 6' 1"  (1.854 m), weight 194 lb 6.4 oz (88.179 kg), SpO2 100.00%.    HEENT: Oropharynx without ecchymoses. Macroglossia. Resp: Lungs clear bilaterally Cardio: Regular rate and rhythm GI: No hepatosplenomegaly Vascular: Trace edema at the right lower leg Neuro: The motor exam appears intact in the upper extremities bilaterally  Skin: Small ecchymoses surrounding the eyes    Lab Results:  Lab Results  Component Value Date   WBC 6.1 09/03/2014   HGB 12.9* 09/03/2014   HCT 38.9 09/03/2014   MCV 95.6 09/03/2014   PLT 154 09/03/2014   NEUTROABS 3.6 09/03/2014   potassium 4.2, creatinine 1.4, calcium 9.7   06/11/2014-lambda free light chains 2.38   Imaging:  No results found.  Medications: I have reviewed the patient's current medications.  Assessment/Plan: 1. 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 2. "Bruising" at the eyelids and mouth: Likely related to amyloidosis, persistent. 3. Numbness and loss of vibratory sense at the fingertips: This predated Velcade-based therapy, but worsened. Now much improved following cervical spine surgery. 4. Elevated serum free lambda light chains. The lambda light chains were lower on November 16 and slightly higher on 12/14/2011. 5. Lambda light chain proteinuria. 6. Bone marrow plasmacytosis: Variable increase in plasma cells noted on the bone marrow biopsy 07/29/2011 with plasma cells estimated to  represent between 4% and 20% of the cellular population. 7. Remote history of prostate cancer. 8. Sleep apnea. 9. Dyslipidemia. 10. Report of pneumonia on 2 occasions in 2011. 11. Admission to a hospital in [place], [place] October 2012 with "pneumonia." A chest x-ray at [facility] on November 2 was negative .  12. Low serum immunoglobulin G level. 13. Plasma cell dyscrasia with associated amyloidosis.  a. Initiation of systemic therapy with Cytoxan, Velcade, and Decadron 08/12/2011. Cycle #2 was initiated on 09/16/2011. Cycle #3 was initiated on 10/15/2011. Velcade was placed on hold due to neuropathy. b. The serum free lambda light chains were decreased on October 08 2011. c. The serum free lambda light chains were slightly increased on 12/14/2011, lower on 12/29/2011 and 02/02/2012. d. Initiation of Revlimid/Decadron February 2013, cycle 2 started on 01/28/2012. e. High-dose melphalan chemotherapy followed by autologous stem cell infusion on 02/09/2013 f. Initiation of Velcade/Decadrontherapy 05/28/2013, he completed 2 cycles on a day 1, 4, 8, 11 schedule g. Improvement in the serum free lambda light chains following induction Velcade/Decadron  h. Initiation of weekly Velcade/Decadron on 07/16/2013, last treatment on 10/16/2013 i. Improvement in the serum free lambda light chains on 08/21/2013 and 09/18/2013 [facility]) j. Normal serum free lambda light chains on 12/13/2013 k. Mild increase of the serum free lambda light chains 02/26/2014, stable 06/11/2014 15. Loss of "balance "and proximal motor weakness secondary to cervical stenosis-status post decompression surgery on 02/19/2012. Improved, but not resolved stenosis with mass effect on the spinal cord noted on a repeat MRI 03/28/2012. He underwent further decompression surgery on 04/06/2012. The ataxia, weakness, and peripheral numbness is much improved.  16. Right lower Extremity  deep vein thrombosis 02/02/2012-a Doppler ultrasound  confirmed a gastrocnemius and peroneal deep vein thrombosis. An IVC filter was placed prior to surgery and he was maintained on Lovenox. The IVC filter was removed on 05/04/2012.  17. Anorexia following cervical spine surgery. Improved.  18. Episode of flank pain and hematuria in 03/31/2012-etiology unclear. He completed a course of antibiotics. The hematuria and pain have resolved.  19. Rectal wall thickening noted on a CT of the pelvis 03/31/2012.  20. colonoscopy showed an area of abnormality at the rectum with biopsy positive for amyloid.  21. Lung nodule noted on the CT 03/31/2012 , no lung nodule on a chest CT 07/30/2013  22. Lumbar stenosis-status post surgery on 07/31/2012.  23. Status post stem cell collection.  24.Rright internal jugular thrombosis, 10/22/2012, likely related to pheresis catheter and HIT  25. Admission with acute right pulmonary embolism 10/27/2012  26. Superior mesenteric vein/portal vein thrombosis diagnosed on a CT of the abdomen 10/28/2012 and 10/29/2012-treated with Argatroban , maintained on Coumadin anticoagulation until he hospital admission March 2014 for stem cell therapy  27. HIT confirmed on a serotonin release assay 10/29/2012  28. History of Renal insufficiency during the December 2013 hospital admission-likely related to dehydration and? Contrast nephropathy , persistent mild elevation of the creatinine  29. Dysphagia-? Secondary to amyloidosis, reported when he was here on 05/08/2013. He did not complain of dysphagia today  30. Anemia secondary to multiple myeloma and chemotherapy , improved  31.right lower Extremity deep vein thrombosis diagnosed at [facility] July 2014 , repeat Doppler at [facility] 07/19/2013 with chronic thrombus in the right gastrocnemius, Eliquis has been discontinued  32. Fall August 2014 with a left shoulder injury, diagnosed with a rotator cuff tear, status post surgical repair 09/19/2013  33. Peripheral neuropathy-progressive, likely secondary  to Velcade. The neuropathy symptoms have partially improved since Velcade was discontinued after treatment 10/16/2013     Disposition:  Mr. Dillinger appears stable. He remains off of specific therapy for multiple myeloma and amyloidosis. He received an influenza vaccine today. He will return for a lab visit in 6 weeks and an office visit in 3 months.  Betsy Coder, MD  09/03/2014  10:13 AM

## 2014-09-03 NOTE — Telephone Encounter (Signed)
Pt confirmed labs/ov per 10/13 POF, gave pt AVS.... KJ °

## 2014-09-04 LAB — KAPPA/LAMBDA LIGHT CHAINS
Kappa free light chain: 1.59 mg/dL (ref 0.33–1.94)
Kappa:Lambda Ratio: 0.51 (ref 0.26–1.65)
Lambda Free Lght Chn: 3.11 mg/dL — ABNORMAL HIGH (ref 0.57–2.63)

## 2014-09-09 ENCOUNTER — Encounter: Payer: Self-pay | Admitting: Cardiology

## 2014-09-09 ENCOUNTER — Ambulatory Visit (INDEPENDENT_AMBULATORY_CARE_PROVIDER_SITE_OTHER): Payer: Medicare Other | Admitting: Cardiology

## 2014-09-09 VITALS — BP 118/62 | HR 68 | Ht 72.0 in | Wt 192.0 lb

## 2014-09-09 DIAGNOSIS — I739 Peripheral vascular disease, unspecified: Secondary | ICD-10-CM

## 2014-09-09 DIAGNOSIS — Z0000 Encounter for general adult medical examination without abnormal findings: Secondary | ICD-10-CM

## 2014-09-09 DIAGNOSIS — G4730 Sleep apnea, unspecified: Secondary | ICD-10-CM | POA: Diagnosis not present

## 2014-09-09 NOTE — Progress Notes (Signed)
[name], [name], [place]  [address] Phone: [phone] Fax:  [phone]  Date:  09/09/2014   ID:  Brent SCHNORR, DOB Aug 04, 1946, MRN [id number]  PCP:  Gennette Pac, MD  Cardiologist:  Fransico Him, MD    History of Present Illness: Brent Allison is a 68 y.o. male with a history of OSA on CPAP with initial sleep study at least 5-6 years ago, dyslipidemia and history of DVT who presents today for followup. He is on CPAP and uses a small nasal pillow mask and tolerates it well.  He feels the pressure is adequate.  He feels rested when he gets up in the am.  He does nap some during the day but attributes this to his cancers in the past.  He snores if he does not use his CPAP.  He has no nasal congestion.     Wt Readings from Last 3 Encounters:  09/09/14 192 lb (87.091 kg)  09/03/14 194 lb 6.4 oz (88.179 kg)  06/11/14 188 lb 4.8 oz (85.412 kg)     Past Medical History  Diagnosis Date  . Dyslipidemia   . H/O multiple myeloma   . Spastic quadriparesis   . DVT (deep venous thrombosis)     right  . Pneumonia     hx of with last time in Nov 2012  . Joint pain   . Sleep apnea     CPAP- q night, [place]- 2 yrs.   . Prostate cancer     amyloidosis, multiple myeloma   . Arthritis     lumbar DDD  . Peripheral vascular disease     DVT- 01/2012, follows by Dr. [name], lovenox maintained since Spring 2013 , pt. aware that last dose is sch. for 07/22/2012  . Blood dyscrasia     plasma cell dyscrasia with associated amyloidosis  . AL amyloidosis 11/04/2012  . Heparin induced thrombocytopenia (HIT) 11/04/2012    No current outpatient prescriptions on file.   No current facility-administered medications for this visit.    Allergies:    Allergies  Allergen Reactions  . Enoxaparin     Other reaction(s): Other (See Comments) Blood clots  . Heparin Other (See Comments)    Heparin induced thrombocytopenia (SRA +)    Social History:  The  patient  reports that he quit smoking about 35 years ago. He has never used smokeless tobacco. He reports that he drinks about .6 ounces of alcohol per week. He reports that he does not use illicit drugs.   Family History:  The patient's family history is negative for Anesthesia problems, Hypotension, Malignant hyperthermia, and Pseudochol deficiency.   ROS:  Please see the history of present illness.      All other systems reviewed and negative.   PHYSICAL EXAM: VS:  BP 118/62  Pulse 68  Ht 6' (1.829 m)  Wt 192 lb (87.091 kg)  BMI 26.03 kg/m2 Well nourished, well developed, in no acute distress HEENT: normal Neck: no JVD Cardiac:  normal S1, S2; RRR; no murmur Lungs:  clear to auscultation bilaterally, no wheezing, rhonchi or rales Abd: soft, nontender, no hepatomegaly Ext: no edema Skin: warm and dry Neuro:  CNs 2-12 intact, no focal abnormalities noted  EKG:  NSR with first degree AV block and nonspecific ST abnormality     ASSESSMENT AND PLAN:  1. Obstructive sleep apnea on CPAP - his last sleep study was at least 5 years ago and he would like  to get a new machine. - I have recommended a split night PSG to reevaluate his OSA since it has been a long time since he has been assessed and has not been following with a sleep MD.  I will set him up for the study and then order a new machine.    I will have him followup with me after his CPAP titration  Signed, Fransico Him, MD [facility] 09/09/2014 2:19 PM

## 2014-09-09 NOTE — Patient Instructions (Signed)
Your physician has recommended that you have a sleep study. This test records several body functions during sleep, including: brain activity, eye movement, oxygen and carbon dioxide blood levels, heart rate and rhythm, breathing rate and rhythm, the flow of air through your mouth and nose, snoring, body muscle movements, and chest and belly movement.  

## 2014-09-26 DIAGNOSIS — D1801 Hemangioma of skin and subcutaneous tissue: Secondary | ICD-10-CM | POA: Diagnosis not present

## 2014-09-26 DIAGNOSIS — Z85828 Personal history of other malignant neoplasm of skin: Secondary | ICD-10-CM | POA: Diagnosis not present

## 2014-09-26 DIAGNOSIS — D225 Melanocytic nevi of trunk: Secondary | ICD-10-CM | POA: Diagnosis not present

## 2014-09-26 DIAGNOSIS — L570 Actinic keratosis: Secondary | ICD-10-CM | POA: Diagnosis not present

## 2014-09-26 DIAGNOSIS — L821 Other seborrheic keratosis: Secondary | ICD-10-CM | POA: Diagnosis not present

## 2014-10-15 ENCOUNTER — Other Ambulatory Visit (HOSPITAL_BASED_OUTPATIENT_CLINIC_OR_DEPARTMENT_OTHER): Payer: Medicare Other

## 2014-10-15 DIAGNOSIS — C9000 Multiple myeloma not having achieved remission: Secondary | ICD-10-CM

## 2014-10-15 LAB — CBC WITH DIFFERENTIAL/PLATELET
BASO%: 0.4 % (ref 0.0–2.0)
Basophils Absolute: 0 10*3/uL (ref 0.0–0.1)
EOS%: 2 % (ref 0.0–7.0)
Eosinophils Absolute: 0.1 10*3/uL (ref 0.0–0.5)
HCT: 39.5 % (ref 38.4–49.9)
HGB: 13 g/dL (ref 13.0–17.1)
LYMPH%: 30.8 % (ref 14.0–49.0)
MCH: 31.8 pg (ref 27.2–33.4)
MCHC: 32.9 g/dL (ref 32.0–36.0)
MCV: 96.6 fL (ref 79.3–98.0)
MONO#: 0.8 10*3/uL (ref 0.1–0.9)
MONO%: 15.1 % — ABNORMAL HIGH (ref 0.0–14.0)
NEUT#: 2.8 10*3/uL (ref 1.5–6.5)
NEUT%: 51.7 % (ref 39.0–75.0)
Platelets: 157 10*3/uL (ref 140–400)
RBC: 4.09 10*6/uL — ABNORMAL LOW (ref 4.20–5.82)
RDW: 13.6 % (ref 11.0–14.6)
WBC: 5.5 10*3/uL (ref 4.0–10.3)
lymph#: 1.7 10*3/uL (ref 0.9–3.3)

## 2014-10-15 LAB — BASIC METABOLIC PANEL (CC13)
Anion Gap: 7 mEq/L (ref 3–11)
BUN: 23.7 mg/dL (ref 7.0–26.0)
CO2: 27 mEq/L (ref 22–29)
Calcium: 9.7 mg/dL (ref 8.4–10.4)
Chloride: 108 mEq/L (ref 98–109)
Creatinine: 1.3 mg/dL (ref 0.7–1.3)
Glucose: 82 mg/dl (ref 70–140)
Potassium: 4.5 mEq/L (ref 3.5–5.1)
Sodium: 142 mEq/L (ref 136–145)

## 2014-10-16 LAB — KAPPA/LAMBDA LIGHT CHAINS
Kappa free light chain: 0.77 mg/dL (ref 0.33–1.94)
Kappa:Lambda Ratio: 0.27 (ref 0.26–1.65)
Lambda Free Lght Chn: 2.87 mg/dL — ABNORMAL HIGH (ref 0.57–2.63)

## 2014-11-12 ENCOUNTER — Telehealth: Payer: Self-pay | Admitting: *Deleted

## 2014-11-12 NOTE — Telephone Encounter (Signed)
Received message from pt; "I'm got a chest cold and sinus congestion; would Dr. [name] call me in something?"  Per Dr. [name]; asked pt if fever, shaking chills---pt reports "NO fever, chills just sinus congestion"  Per Dr. [name]; explained to pt that it's probably viral and needs to run its course and MD will not call anything in at this time.  Instructed pt to cal if fever, chills, etc indicating bacterial infection.  Pt verbalized understanding of information and expressed appreciation for call back.

## 2014-11-27 ENCOUNTER — Ambulatory Visit (HOSPITAL_BASED_OUTPATIENT_CLINIC_OR_DEPARTMENT_OTHER): Payer: Medicare Other | Attending: Cardiology | Admitting: Radiology

## 2014-11-27 VITALS — Ht 72.0 in | Wt 194.0 lb

## 2014-11-27 DIAGNOSIS — G4733 Obstructive sleep apnea (adult) (pediatric): Secondary | ICD-10-CM | POA: Diagnosis not present

## 2014-11-27 DIAGNOSIS — G4730 Sleep apnea, unspecified: Secondary | ICD-10-CM | POA: Diagnosis not present

## 2014-12-03 ENCOUNTER — Telehealth: Payer: Self-pay | Admitting: Oncology

## 2014-12-03 ENCOUNTER — Other Ambulatory Visit: Payer: Medicare Other

## 2014-12-03 ENCOUNTER — Ambulatory Visit (HOSPITAL_BASED_OUTPATIENT_CLINIC_OR_DEPARTMENT_OTHER): Payer: Medicare Other | Admitting: Nurse Practitioner

## 2014-12-03 ENCOUNTER — Other Ambulatory Visit (HOSPITAL_BASED_OUTPATIENT_CLINIC_OR_DEPARTMENT_OTHER): Payer: Medicare Other

## 2014-12-03 ENCOUNTER — Ambulatory Visit: Payer: Medicare Other | Admitting: Oncology

## 2014-12-03 VITALS — BP 111/63 | HR 60 | Temp 97.5°F | Resp 19 | Ht 72.0 in | Wt 200.7 lb

## 2014-12-03 DIAGNOSIS — D6481 Anemia due to antineoplastic chemotherapy: Secondary | ICD-10-CM

## 2014-12-03 DIAGNOSIS — D630 Anemia in neoplastic disease: Secondary | ICD-10-CM | POA: Diagnosis not present

## 2014-12-03 DIAGNOSIS — E859 Amyloidosis, unspecified: Secondary | ICD-10-CM

## 2014-12-03 DIAGNOSIS — E8581 Light chain (AL) amyloidosis: Secondary | ICD-10-CM

## 2014-12-03 DIAGNOSIS — C9000 Multiple myeloma not having achieved remission: Secondary | ICD-10-CM

## 2014-12-03 DIAGNOSIS — Z86718 Personal history of other venous thrombosis and embolism: Secondary | ICD-10-CM | POA: Diagnosis not present

## 2014-12-03 LAB — CBC WITH DIFFERENTIAL/PLATELET
BASO%: 0.9 % (ref 0.0–2.0)
Basophils Absolute: 0 10*3/uL (ref 0.0–0.1)
EOS%: 2.8 % (ref 0.0–7.0)
Eosinophils Absolute: 0.1 10*3/uL (ref 0.0–0.5)
HCT: 40.7 % (ref 38.4–49.9)
HGB: 13.2 g/dL (ref 13.0–17.1)
LYMPH%: 28.4 % (ref 14.0–49.0)
MCH: 31.5 pg (ref 27.2–33.4)
MCHC: 32.4 g/dL (ref 32.0–36.0)
MCV: 97.3 fL (ref 79.3–98.0)
MONO#: 0.6 10*3/uL (ref 0.1–0.9)
MONO%: 11.2 % (ref 0.0–14.0)
NEUT#: 2.9 10*3/uL (ref 1.5–6.5)
NEUT%: 56.7 % (ref 39.0–75.0)
Platelets: 173 10*3/uL (ref 140–400)
RBC: 4.18 10*6/uL — ABNORMAL LOW (ref 4.20–5.82)
RDW: 13.6 % (ref 11.0–14.6)
WBC: 5.2 10*3/uL (ref 4.0–10.3)
lymph#: 1.5 10*3/uL (ref 0.9–3.3)

## 2014-12-03 LAB — BASIC METABOLIC PANEL (CC13)
Anion Gap: 7 mEq/L (ref 3–11)
BUN: 20.9 mg/dL (ref 7.0–26.0)
CO2: 28 mEq/L (ref 22–29)
Calcium: 9 mg/dL (ref 8.4–10.4)
Chloride: 107 mEq/L (ref 98–109)
Creatinine: 1.4 mg/dL — ABNORMAL HIGH (ref 0.7–1.3)
EGFR: 53 mL/min/{1.73_m2} — ABNORMAL LOW (ref >90)
Glucose: 102 mg/dl (ref 70–140)
Potassium: 4.6 mEq/L (ref 3.5–5.1)
Sodium: 142 mEq/L (ref 136–145)

## 2014-12-03 NOTE — Telephone Encounter (Signed)
gv adn rpinted appt sched and avs for pt for June

## 2014-12-03 NOTE — Progress Notes (Signed)
[place] OFFICE PROGRESS NOTE   Diagnosis:  Multiple myeloma, amyloidosis  INTERVAL HISTORY:   Brent Allison returns as scheduled. He reports having a "cold" in December 2015. Symptoms have resolved. He denies sweats. No pain. No bleeding. Stable strength in the arms and legs.  Objective:  Vital signs in last 24 hours:  Blood pressure 111/63, pulse 60, temperature 97.5 F (36.4 C), temperature source Oral, resp. rate 19, height 6' (1.829 m), weight 200 lb 11.2 oz (91.037 kg), SpO2 100 %.    HEENT: No thrush or ulcerations. Lymphatics: No palpable cervical or supraclavicular lymph nodes. Resp: Lungs clear bilaterally. Cardio: Regular rate and rhythm. GI: Abdomen soft and nontender. No organomegaly. Vascular: Trace edema right lower leg. Neuro: Motor strength 5 over 5 in the upper and lower extremities.  Skin: Ecchymosis right upper eyelid.    Lab Results:  Lab Results  Component Value Date   WBC 5.2 12/03/2014   HGB 13.2 12/03/2014   HCT 40.7 12/03/2014   MCV 97.3 12/03/2014   PLT 173 12/03/2014   NEUTROABS 2.9 12/03/2014    Imaging:  No results found.  Medications: I have reviewed the patient's current medications.  Assessment/Plan: 1. 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 2. "Bruising" at the eyelids and mouth: Likely related to amyloidosis, persistent. 3. Numbness and loss of vibratory sense at the fingertips: This predated Velcade-based therapy, but worsened. Now much improved following cervical spine surgery. 4. Elevated serum free lambda light chains. The lambda light chains were lower on November 16 and slightly higher on 12/14/2011. 5. Lambda light chain proteinuria. 6. Bone marrow plasmacytosis: Variable increase in plasma cells noted on the bone marrow biopsy 07/29/2011 with plasma cells estimated to represent between 4% and 20% of the cellular population. 7. Remote history of prostate cancer. 8. Sleep  apnea. 9. Dyslipidemia. 10. Report of pneumonia on 2 occasions in 2011. 11. Admission to a hospital in [place], [place] October 2012 with "pneumonia." A chest x-ray at [facility] on November 2 was negative .  12. Low serum immunoglobulin G level. 13. Plasma cell dyscrasia with associated amyloidosis.  1. Initiation of systemic therapy with Cytoxan, Velcade, and Decadron 08/12/2011. Cycle #2 was initiated on 09/16/2011. Cycle #3 was initiated on 10/15/2011. Velcade was placed on hold due to neuropathy. 2. The serum free lambda light chains were decreased on October 08 2011. 3. The serum free lambda light chains were slightly increased on 12/14/2011, lower on 12/29/2011 and 02/02/2012. 4. Initiation of Revlimid/Decadron February 2013, cycle 2 started on 01/28/2012. 5. High-dose melphalan chemotherapy followed by autologous stem cell infusion on 02/09/2013 6. Initiation of Velcade/Decadrontherapy 05/28/2013, he completed 2 cycles on a day 1, 4, 8, 11 schedule 7. Improvement in the serum free lambda light chains following induction Velcade/Decadron  8. Initiation of weekly Velcade/Decadron on 07/16/2013, last treatment on 10/16/2013 9. Improvement in the serum free lambda light chains on 08/21/2013 and 09/18/2013 ([facility]) 10. Normal serum free lambda light chains on 12/13/2013 11. Mild increase of the serum free lambda light chains 02/26/2014, stable 06/11/2014 15. Loss of "balance "and proximal motor weakness secondary to cervical stenosis-status post decompression surgery on 02/19/2012. Improved, but not resolved stenosis with mass effect on the spinal cord noted on a repeat MRI 03/28/2012. He underwent further decompression surgery on 04/06/2012. The ataxia, weakness, and peripheral numbness is much improved.  16. Right lower Extremity deep vein thrombosis 02/02/2012-a Doppler ultrasound confirmed a gastrocnemius and peroneal deep vein thrombosis. An IVC filter was placed  prior to surgery  and he was maintained on Lovenox. The IVC filter was removed on 05/04/2012.  17. Anorexia following cervical spine surgery. Improved.  18. Episode of flank pain and hematuria in 03/31/2012-etiology unclear. He completed a course of antibiotics. The hematuria and pain have resolved.  19. Rectal wall thickening noted on a CT of the pelvis 03/31/2012.  20. colonoscopy showed an area of abnormality at the rectum with biopsy positive for amyloid.  21. Lung nodule noted on the CT 03/31/2012 , no lung nodule on a chest CT 07/30/2013  22. Lumbar stenosis-status post surgery on 07/31/2012.  23. Status post stem cell collection.  24.Rright internal jugular thrombosis, 10/22/2012, likely related to pheresis catheter and HIT  25. Admission with acute right pulmonary embolism 10/27/2012  26. Superior mesenteric vein/portal vein thrombosis diagnosed on a CT of the abdomen 10/28/2012 and 10/29/2012-treated with Argatroban , maintained on Coumadin anticoagulation until he hospital admission March 2014 for stem cell therapy  27. HIT confirmed on a serotonin release assay 10/29/2012  28. History of Renal insufficiency during the December 2013 hospital admission-likely related to dehydration and? Contrast nephropathy , persistent mild elevation of the creatinine  29. Dysphagia-? Secondary to amyloidosis, reported when he was here on 05/08/2013. He did not complain of dysphagia today  30. Anemia secondary to multiple myeloma and chemotherapy , improved  31.right lower Extremity deep vein thrombosis diagnosed at [facility] July 2014 , repeat Doppler at [facility] 07/19/2013 with chronic thrombus in the right gastrocnemius, Eliquis has been discontinued  32. Fall August 2014 with a left shoulder injury, diagnosed with a rotator cuff tear, status post surgical repair 09/19/2013  33. 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 The neuropathy symptoms have partially improved since Velcade was  discontinued after treatment 10/16/2013    Disposition: Brent Allison appears stable. He remains off of specific therapy for multiple myeloma and amyloidosis. We will follow-up on the outstanding labs from today. He has a follow-up visit with Dr. Melba Coon at [facility] in April of this year. He will return for a follow-up visit here in June. He will contact the office in the interim with any problems.  Plan reviewed with Dr. [name].    Ned Card ANP/GNP-BC   12/03/2014  10:11 AM

## 2014-12-05 ENCOUNTER — Telehealth: Payer: Self-pay | Admitting: Cardiology

## 2014-12-05 DIAGNOSIS — G4733 Obstructive sleep apnea (adult) (pediatric): Secondary | ICD-10-CM | POA: Insufficient documentation

## 2014-12-05 LAB — PROTEIN ELECTROPHORESIS, SERUM
Albumin ELP: 55 % — ABNORMAL LOW (ref 55.8–66.1)
Alpha-1-Globulin: 4.6 % (ref 2.9–4.9)
Alpha-2-Globulin: 13.1 % — ABNORMAL HIGH (ref 7.1–11.8)
Beta 2: 5.7 % (ref 3.2–6.5)
Beta Globulin: 6.9 % (ref 4.7–7.2)
Gamma Globulin: 14.7 % (ref 11.1–18.8)
Total Protein, Serum Electrophoresis: 6.4 g/dL (ref 6.0–8.3)

## 2014-12-05 LAB — KAPPA/LAMBDA LIGHT CHAINS
Kappa free light chain: 2.48 mg/dL — ABNORMAL HIGH (ref 0.33–1.94)
Kappa:Lambda Ratio: 0.52 (ref 0.26–1.65)
Lambda Free Lght Chn: 4.74 mg/dL — ABNORMAL HIGH (ref 0.57–2.63)

## 2014-12-05 LAB — IGG, IGA, IGM
IgA: 192 mg/dL (ref 68–379)
IgG (Immunoglobin G), Serum: 932 mg/dL (ref 650–1600)
IgM, Serum: 85 mg/dL (ref 41–251)

## 2014-12-05 NOTE — Addendum Note (Signed)
Addended by: Fransico Him R on: 12/05/2014 10:42 PM   Modules accepted: Orders

## 2014-12-05 NOTE — Telephone Encounter (Signed)
Please let patient know that he has severe OSA with AHI 44/hr and set up for CPAP titration.

## 2014-12-05 NOTE — Sleep Study (Addendum)
   NAME: Brent Allison DATE OF BIRTH:  [birth year] MEDICAL RECORD NUMBER [id number]  LOCATION: [facility]  PHYSICIAN: Audyn Dimercurio R  DATE OF STUDY: 11/27/2014  SLEEP STUDY TYPE: Nocturnal Polysomnogram with CPAP titration               REFERRING PHYSICIAN: Sueanne Margarita, MD  INDICATION FOR STUDY: witnessed apnea and snoring  EPWORTH SLEEPINESS SCORE: 3 HEIGHT: 6' (182.9 cm)  WEIGHT: 194 lb (87.998 kg)    Body mass index is 26.31 kg/(m^2).  NECK SIZE: 15.5 in.  MEDICATIONS: Reviewed in the chart  SLEEP ARCHITECTURE: The patient slept for a total of 313 minutes with no slow wave sleep and 44 minutes of REM sleep.  Thee onset to sleep latency was 2.5 minutes and onset to REM sleep latency was normal at 100 minutes. The sleep efficiency was normal at 86%.  RESPIRATORY DATA: The patient had 164 apneas, of which, 161 were obstructive, 2 central and 1 mixed.  There were 68 hypopneas.  Most events occurred in NREM sleep in the supine position.  Mild to moderate snoring was noted.  The total AHI was 44.4 events per hour consistent with severe obstructive sleep apnea/hypopnea syndrome.  The patient was started on CPAP at 4cm H2O and increased for respiratory events and snoring to 12cm H2O.  The patient was able to achieve REM supine sleep for a prolonged period of time without further respiratory events at a pressure of 11cm H2O.  The AHI was 0.    OXYGEN DATA: The lowest O2 saturation noted was 87% in REM sleep and 82% in NREM sleep.  There was a total of 2.9 minutes of O2 saturations <88% during the study.    CARDIAC DATA: The patient maintained NSR with PVC's during the study.    MOVEMENT/PARASOMNIA: There were no periodic limb movement disorders or REM behavior disorders noted.  IMPRESSION/ RECOMMENDATION:   1.  Severe obstructive sleep apnea/hypopnea syndrome with a total AHI of 44.4 events per hour.  Most events occurred in the supine position during NREM  sleep.   2.  Mild to moderate snoring was noted. 3.  Increased frequency of arousals associated with respiratory events.   4.  Oxygen desaturations associated with respiratory events with O2 desaturations as low as 82%. 5.  Successful CPAP titration to 11cm H2O.  Recommend ResMed CPAP with heated humidifier, C flex of 3 and medium Respironics Nuance Pro Gel nasal pillow mask with chin strap set at 11cm H2O. 6.  The patient should be counseled in good sleep hygiene. 7.  The patient should be counseled to avoid sleeping in the supine position.  Signed:  Sueanne Margarita Diplomate, American Board of Sleep Medicine  ELECTRONICALLY SIGNED ON:  12/05/2014, 10:24 PM [place] PH: [phone]   FX: [phone] [place]

## 2014-12-05 NOTE — Telephone Encounter (Signed)
Please disregard prior note.  Please let patient know that he has severe OSA with AHI 44/hr and had successful CPAP titration to 11cm H2O.  Please let [facility] know that order for CPAP is in EPIC and set up appt with me in 10 weeks

## 2014-12-06 NOTE — Telephone Encounter (Signed)
Left message to call back  

## 2014-12-10 NOTE — Telephone Encounter (Signed)
Left message to call back  

## 2014-12-11 NOTE — Telephone Encounter (Signed)
Left message to call back  

## 2014-12-11 NOTE — Telephone Encounter (Signed)
Informed patient of CPAP titration results.  Informed [facility] order is in EPIC. FU OV scheduled for April 7. Patient agrees with treatment plan.

## 2014-12-17 ENCOUNTER — Telehealth: Payer: Self-pay | Admitting: *Deleted

## 2014-12-17 DIAGNOSIS — C9000 Multiple myeloma not having achieved remission: Secondary | ICD-10-CM

## 2014-12-17 NOTE — Telephone Encounter (Signed)
Returned call to pt, Dr. [name] recommends checking light chains in 2 mos. Pt reports he has an office visit scheduled at [facility] early April. Wants to have lab scheduled in this office. Order sent to scheduler.

## 2014-12-17 NOTE — Telephone Encounter (Signed)
Patient called.  Noted increased lab results. Asked should he be concerned and what Dr. [name] thinks,  Requesting appointment for light chains to be checked again before June if Dr. [name] thinks this is needed.  Can be reached via mobile line [phone].  Message printed for provider review.

## 2014-12-18 ENCOUNTER — Telehealth: Payer: Self-pay | Admitting: Oncology

## 2014-12-18 NOTE — Telephone Encounter (Signed)
, °

## 2015-01-27 ENCOUNTER — Other Ambulatory Visit: Payer: Medicare Other

## 2015-01-27 DIAGNOSIS — C9000 Multiple myeloma not having achieved remission: Secondary | ICD-10-CM

## 2015-01-28 LAB — KAPPA/LAMBDA LIGHT CHAINS
Kappa free light chain: 2.02 mg/dL — ABNORMAL HIGH (ref 0.33–1.94)
Kappa:Lambda Ratio: 0.49 (ref 0.26–1.65)
Lambda Free Lght Chn: 4.1 mg/dL — ABNORMAL HIGH (ref 0.57–2.63)

## 2015-02-26 ENCOUNTER — Encounter: Payer: Self-pay | Admitting: Cardiology

## 2015-02-26 DIAGNOSIS — Z9481 Bone marrow transplant status: Secondary | ICD-10-CM | POA: Diagnosis not present

## 2015-02-26 DIAGNOSIS — Z6826 Body mass index (BMI) 26.0-26.9, adult: Secondary | ICD-10-CM | POA: Diagnosis not present

## 2015-02-26 DIAGNOSIS — C9000 Multiple myeloma not having achieved remission: Secondary | ICD-10-CM | POA: Diagnosis not present

## 2015-02-26 DIAGNOSIS — Z23 Encounter for immunization: Secondary | ICD-10-CM | POA: Diagnosis not present

## 2015-02-26 NOTE — Progress Notes (Signed)
Cardiology Office Note   Date:  02/27/2015   ID:  Brent Allison, DOB [birth year], MRN [id number]  PCP:  Gennette Pac, MD    Chief Complaint  Patient presents with  . Sleep Apnea      History of Present Illness: Brent Allison is a 69 y.o. male with a history of OSA on CPAP, dyslipidemia and history of DVT who presents today for followup. He is on CPAP and uses a small nasal pillow mask and tolerates it well. He feels the pressure is adequate. He feels rested when he gets up in the am and has no daytime sleepiness.  He snores if he does not use his CPAP. He has no nasal congestion.     Past Medical History  Diagnosis Date  . Dyslipidemia   . H/O multiple myeloma   . Spastic quadriparesis   . DVT (deep venous thrombosis)     right  . Pneumonia     hx of with last time in Nov 2012  . Joint pain   . Sleep apnea     severe with AHI 44.4 events per hour - now on CPAP at 11cm H2O  . Prostate cancer     amyloidosis, multiple myeloma   . Arthritis     lumbar DDD  . Peripheral vascular disease     DVT- 01/2012, follows by Dr. [name], lovenox maintained since Spring 2013 , pt. aware that last dose is sch. for 07/22/2012  . Blood dyscrasia     plasma cell dyscrasia with associated amyloidosis  . AL amyloidosis 11/04/2012  . Heparin induced thrombocytopenia (HIT) 11/04/2012    Past Surgical History  Procedure Laterality Date  . Prostatectomy  around 2005  . Posterior laminectomy / decompression lumbar spine  around 2007    L4-5  . Carpal tunnel release  2009    left  . Back surgery    . Anterior cervical decomp/discectomy fusion  02/26/2012    Procedure: ANTERIOR CERVICAL DECOMPRESSION/DISCECTOMY FUSION 2 LEVELS;  Surgeon: Hosie Spangle, MD;  Location: [facility];  Service: Neurosurgery;  Laterality: N/A;  C3-4 C4-5 Anterior cervical decompression/diskectomy, fusion  . Tonsillectomy      as a child  . Hernia repair      around 1985  . Cardiac  catheterization  2010  . Colonoscopy    . Lipoma excision  around 1985    removed from one of his shoulders  . Posterior cervical fusion/foraminotomy  04/06/2012    Procedure: POSTERIOR CERVICAL FUSION/FORAMINOTOMY LEVEL 3;  Surgeon: Hosie Spangle, MD;  Location: [place] [facility];  Service: Neurosurgery;  Laterality: N/A;  C3 and C4 laminectomy with C23 C34 C45 posterior cervical arthrodesis with instrumentation  . Ivt      IVC filter placed d/t blood clots, removed- July- 2013     No current outpatient prescriptions on file.   No current facility-administered medications for this visit.    Allergies:   Enoxaparin and Heparin    Social History:  The patient  reports that he quit smoking about 35 years ago. He has never used smokeless tobacco. He reports that he drinks about 0.6 oz of alcohol per week. He reports that he does not use illicit drugs.   Family History:  The patient's family history includes Pancreatic cancer in his mother. There is no history of Anesthesia problems, Hypotension, Malignant hyperthermia, or Pseudochol deficiency.    ROS:  Please see the history of present illness.  Otherwise, review of systems are positive for none.   All other systems are reviewed and negative.    PHYSICAL EXAM: VS:  BP 110/54 mmHg  Pulse 63  Ht 6' 1"  (1.854 m)  Wt 199 lb 6.4 oz (90.447 kg)  BMI 26.31 kg/m2  SpO2 99% , BMI Body mass index is 26.31 kg/(m^2). GEN: Well nourished, well developed, in no acute distress HEENT: normal Neck: no JVD, carotid bruits, or masses Cardiac: RRR; no murmurs, rubs, or gallops,no edema  Respiratory:  clear to auscultation bilaterally, normal work of breathing GI: soft, nontender, nondistended, + BS MS: no deformity or atrophy Skin: warm and dry, no rash Neuro:  Strength and sensation are intact Psych: euthymic mood, full affect   EKG:  EKG is not ordered today.    Recent Labs: 12/03/2014: BUN 20.9; Creatinine 1.4*; Hemoglobin 13.2;  Platelets 173; Potassium 4.6; Sodium 142    Lipid Panel    Component Value Date/Time   CHOL 84 11/06/2012 0600   TRIG 30 11/06/2012 0600      Wt Readings from Last 3 Encounters:  02/27/15 199 lb 6.4 oz (90.447 kg)  12/03/14 200 lb 11.2 oz (91.037 kg)  11/27/14 194 lb (87.998 kg)       ASSESSMENT AND PLAN:  1.  Obstructive sleep apnea on CPAP - he is tolerating his new CPAP device well.  He will continue on current settings.  He will be eligible for his new device in 3 weeks and [facility] will call him  Current medicines are reviewed at length with the patient today.  The patient does not have concerns regarding medicines.  The following changes have been made:  no change  Labs/ tests ordered today include: see above assessment and plan No orders of the defined types were placed in this encounter.     Disposition:   FU with me in 6 months   Signed, Sueanne Margarita, MD  02/27/2015 8:52 AM    [facility] [address], [place], [place]  [address] Phone: [phone]; Fax: [phone]

## 2015-02-27 ENCOUNTER — Encounter: Payer: Self-pay | Admitting: Cardiology

## 2015-02-27 ENCOUNTER — Ambulatory Visit (INDEPENDENT_AMBULATORY_CARE_PROVIDER_SITE_OTHER): Payer: Medicare Other | Admitting: Cardiology

## 2015-02-27 VITALS — BP 110/54 | HR 63 | Ht 73.0 in | Wt 199.4 lb

## 2015-02-27 DIAGNOSIS — G4733 Obstructive sleep apnea (adult) (pediatric): Secondary | ICD-10-CM | POA: Diagnosis not present

## 2015-02-27 NOTE — Patient Instructions (Signed)
Your physician wants you to follow-up in: 6 months with Dr Turner. (October 2016).  You will receive a reminder letter in the mail two months in advance. If you don't receive a letter, please call our office to schedule the follow-up appointment.  

## 2015-03-05 ENCOUNTER — Telehealth: Payer: Self-pay | Admitting: Cardiology

## 2015-03-05 NOTE — Telephone Encounter (Signed)
New message     Pt want Dr Radford Pax to know that he has not heard from [facility] regarding CPAP stuff

## 2015-03-05 NOTE — Telephone Encounter (Signed)
Informed patient that Dr. Radford Pax and nurse, Valetta Fuller, are not in the office today. Explained that he be would contacted when she returns. Patient verbalized understanding and agreeable.

## 2015-03-06 NOTE — Telephone Encounter (Signed)
Apologized to patient he has not heard from [facility]. Informed him that per [facility], he is not eligible for a new machine until AFTER March 20, 2015 when his 5 years is up.  Informed patient [facility] has a note to call him on the 28th.  Patient agrees with treatment plan.

## 2015-04-01 ENCOUNTER — Encounter: Payer: Self-pay | Admitting: Cardiology

## 2015-04-01 DIAGNOSIS — M5136 Other intervertebral disc degeneration, lumbar region: Secondary | ICD-10-CM | POA: Diagnosis not present

## 2015-04-01 DIAGNOSIS — M5030 Other cervical disc degeneration, unspecified cervical region: Secondary | ICD-10-CM | POA: Diagnosis not present

## 2015-04-01 DIAGNOSIS — M4712 Other spondylosis with myelopathy, cervical region: Secondary | ICD-10-CM | POA: Diagnosis not present

## 2015-04-01 DIAGNOSIS — M47816 Spondylosis without myelopathy or radiculopathy, lumbar region: Secondary | ICD-10-CM | POA: Diagnosis not present

## 2015-04-01 DIAGNOSIS — Z981 Arthrodesis status: Secondary | ICD-10-CM | POA: Diagnosis not present

## 2015-04-01 DIAGNOSIS — M546 Pain in thoracic spine: Secondary | ICD-10-CM | POA: Diagnosis not present

## 2015-04-01 DIAGNOSIS — M542 Cervicalgia: Secondary | ICD-10-CM | POA: Diagnosis not present

## 2015-04-01 DIAGNOSIS — M549 Dorsalgia, unspecified: Secondary | ICD-10-CM | POA: Diagnosis not present

## 2015-04-02 DIAGNOSIS — Z981 Arthrodesis status: Secondary | ICD-10-CM | POA: Diagnosis not present

## 2015-04-03 DIAGNOSIS — M9972 Connective tissue and disc stenosis of intervertebral foramina of thoracic region: Secondary | ICD-10-CM | POA: Diagnosis not present

## 2015-04-03 DIAGNOSIS — M47816 Spondylosis without myelopathy or radiculopathy, lumbar region: Secondary | ICD-10-CM | POA: Diagnosis not present

## 2015-04-03 DIAGNOSIS — M9971 Connective tissue and disc stenosis of intervertebral foramina of cervical region: Secondary | ICD-10-CM | POA: Diagnosis not present

## 2015-04-03 DIAGNOSIS — M5136 Other intervertebral disc degeneration, lumbar region: Secondary | ICD-10-CM | POA: Diagnosis not present

## 2015-04-03 DIAGNOSIS — M4806 Spinal stenosis, lumbar region: Secondary | ICD-10-CM | POA: Diagnosis not present

## 2015-04-03 DIAGNOSIS — M4322 Fusion of spine, cervical region: Secondary | ICD-10-CM | POA: Diagnosis not present

## 2015-04-03 DIAGNOSIS — M4326 Fusion of spine, lumbar region: Secondary | ICD-10-CM | POA: Diagnosis not present

## 2015-04-03 DIAGNOSIS — M4712 Other spondylosis with myelopathy, cervical region: Secondary | ICD-10-CM | POA: Diagnosis not present

## 2015-04-16 DIAGNOSIS — M47814 Spondylosis without myelopathy or radiculopathy, thoracic region: Secondary | ICD-10-CM | POA: Diagnosis not present

## 2015-04-16 DIAGNOSIS — M5124 Other intervertebral disc displacement, thoracic region: Secondary | ICD-10-CM | POA: Diagnosis not present

## 2015-04-16 DIAGNOSIS — M5134 Other intervertebral disc degeneration, thoracic region: Secondary | ICD-10-CM | POA: Diagnosis not present

## 2015-04-16 DIAGNOSIS — M5136 Other intervertebral disc degeneration, lumbar region: Secondary | ICD-10-CM | POA: Diagnosis not present

## 2015-04-16 DIAGNOSIS — M4804 Spinal stenosis, thoracic region: Secondary | ICD-10-CM | POA: Diagnosis not present

## 2015-04-28 DIAGNOSIS — M4714 Other spondylosis with myelopathy, thoracic region: Secondary | ICD-10-CM | POA: Diagnosis not present

## 2015-04-28 DIAGNOSIS — Z6825 Body mass index (BMI) 25.0-25.9, adult: Secondary | ICD-10-CM | POA: Diagnosis not present

## 2015-04-28 DIAGNOSIS — M5134 Other intervertebral disc degeneration, thoracic region: Secondary | ICD-10-CM | POA: Diagnosis not present

## 2015-04-28 DIAGNOSIS — M4802 Spinal stenosis, cervical region: Secondary | ICD-10-CM | POA: Diagnosis not present

## 2015-04-28 DIAGNOSIS — M4314 Spondylolisthesis, thoracic region: Secondary | ICD-10-CM | POA: Diagnosis not present

## 2015-04-28 DIAGNOSIS — M4804 Spinal stenosis, thoracic region: Secondary | ICD-10-CM | POA: Diagnosis not present

## 2015-04-29 ENCOUNTER — Telehealth: Payer: Self-pay | Admitting: Oncology

## 2015-04-29 ENCOUNTER — Ambulatory Visit (HOSPITAL_BASED_OUTPATIENT_CLINIC_OR_DEPARTMENT_OTHER): Payer: Medicare Other | Admitting: Oncology

## 2015-04-29 ENCOUNTER — Other Ambulatory Visit (HOSPITAL_BASED_OUTPATIENT_CLINIC_OR_DEPARTMENT_OTHER): Payer: Medicare Other

## 2015-04-29 ENCOUNTER — Other Ambulatory Visit: Payer: Self-pay | Admitting: Neurosurgery

## 2015-04-29 VITALS — BP 124/79 | HR 74 | Resp 18 | Ht 73.0 in | Wt 187.1 lb

## 2015-04-29 DIAGNOSIS — Z86718 Personal history of other venous thrombosis and embolism: Secondary | ICD-10-CM

## 2015-04-29 DIAGNOSIS — R809 Proteinuria, unspecified: Secondary | ICD-10-CM | POA: Diagnosis not present

## 2015-04-29 DIAGNOSIS — E8581 Light chain (AL) amyloidosis: Secondary | ICD-10-CM

## 2015-04-29 DIAGNOSIS — E858 Other amyloidosis: Secondary | ICD-10-CM

## 2015-04-29 DIAGNOSIS — G629 Polyneuropathy, unspecified: Secondary | ICD-10-CM

## 2015-04-29 DIAGNOSIS — C9000 Multiple myeloma not having achieved remission: Secondary | ICD-10-CM | POA: Diagnosis not present

## 2015-04-29 LAB — CBC WITH DIFFERENTIAL/PLATELET
BASO%: 0.8 % (ref 0.0–2.0)
Basophils Absolute: 0 10*3/uL (ref 0.0–0.1)
EOS%: 2.2 % (ref 0.0–7.0)
Eosinophils Absolute: 0.1 10*3/uL (ref 0.0–0.5)
HCT: 42.9 % (ref 38.4–49.9)
HGB: 14.1 g/dL (ref 13.0–17.1)
LYMPH%: 28.2 % (ref 14.0–49.0)
MCH: 31.2 pg (ref 27.2–33.4)
MCHC: 32.9 g/dL (ref 32.0–36.0)
MCV: 94.9 fL (ref 79.3–98.0)
MONO#: 1.1 10*3/uL — ABNORMAL HIGH (ref 0.1–0.9)
MONO%: 20 % — ABNORMAL HIGH (ref 0.0–14.0)
NEUT#: 2.6 10*3/uL (ref 1.5–6.5)
NEUT%: 48.8 % (ref 39.0–75.0)
Platelets: 167 10*3/uL (ref 140–400)
RBC: 4.53 10*6/uL (ref 4.20–5.82)
RDW: 13.7 % (ref 11.0–14.6)
WBC: 5.2 10*3/uL (ref 4.0–10.3)
lymph#: 1.5 10*3/uL (ref 0.9–3.3)

## 2015-04-29 LAB — COMPREHENSIVE METABOLIC PANEL (CC13)
ALT: 8 U/L (ref 0–55)
AST: 15 U/L (ref 5–34)
Albumin: 3.3 g/dL — ABNORMAL LOW (ref 3.5–5.0)
Alkaline Phosphatase: 95 U/L (ref 40–150)
Anion Gap: 8 mEq/L (ref 3–11)
BUN: 21.8 mg/dL (ref 7.0–26.0)
CO2: 25 mEq/L (ref 22–29)
Calcium: 9.4 mg/dL (ref 8.4–10.4)
Chloride: 105 mEq/L (ref 98–109)
Creatinine: 1.3 mg/dL (ref 0.7–1.3)
EGFR: 53 mL/min/{1.73_m2} — ABNORMAL LOW (ref >90)
Glucose: 96 mg/dl (ref 70–140)
Potassium: 4.5 mEq/L (ref 3.5–5.1)
Sodium: 138 mEq/L (ref 136–145)
Total Bilirubin: 0.49 mg/dL (ref 0.20–1.20)
Total Protein: 6.7 g/dL (ref 6.4–8.3)

## 2015-04-29 NOTE — Telephone Encounter (Signed)
s.w. pt and advised on 6.14 echo appt.Marland KitchenMarland KitchenMarland KitchenMarland Kitchenpt ok and aware

## 2015-04-29 NOTE — Progress Notes (Signed)
[place] OFFICE PROGRESS NOTE   Diagnosis: Amyloidosis  INTERVAL HISTORY:   Brent Allison returns as scheduled. He has developed lower extremity numbness. He saw Dr. Sherwood Gambler and is being scheduled for "C7-T1 "fusion surgery. No pain. He has some difficulty with ambulation. No bleeding. He otherwise feels well. He currently has a "cold ".  Objective:  Vital signs in last 24 hours:  Blood pressure 124/79, pulse 74, resp. rate 18, height 6' 1"  (1.854 m), weight 187 lb 1.6 oz (84.868 kg), SpO2 98 %.    HEENT: Oral cavity without bleeding, macroglossia Resp: Lungs clear bilaterally Cardio: Reglan rate and rhythm GI: No hepatomegaly, nontender Vascular: No leg edema Neuro: The motor exam is intact in the upper and lower extremities bilaterally  Skin: Fingers without ecchymoses     Lab Results:  Lab Results  Component Value Date   WBC 5.2 04/29/2015   HGB 14.1 04/29/2015   HCT 42.9 04/29/2015   MCV 94.9 04/29/2015   PLT 167 04/29/2015   NEUTROABS 2.6 04/29/2015    Lab Results  Component Value Date   NA 138 04/29/2015    No results found for: CEA  Imaging:  No results found.  Medications: I have reviewed the patient's current medications.  Assessment/Plan: 1. 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 2. "Bruising" at the eyelids and mouth: Likely related to amyloidosis, persistent. 3. Numbness and loss of vibratory sense at the fingertips: This predated Velcade-based therapy, but worsened. Now much improved following cervical spine surgery. 4. Elevated serum free lambda light chains. The lambda light chains were lower on November 16 and slightly higher on 12/14/2011. 5. Lambda light chain proteinuria. 6. Bone marrow plasmacytosis: Variable increase in plasma cells noted on the bone marrow biopsy 07/29/2011 with plasma cells estimated to represent between 4% and 20% of the cellular population. 7. Remote history of prostate cancer. 8. Sleep  apnea. 9. Dyslipidemia. 10. Report of pneumonia on 2 occasions in 2011. 11. Admission to a hospital in [place], [place] October 2012 with "pneumonia." A chest x-ray at [facility] on November 2 was negative .  12. Low serum immunoglobulin G level. 13. Plasma cell dyscrasia with associated amyloidosis.  1. Initiation of systemic therapy with Cytoxan, Velcade, and Decadron 08/12/2011. Cycle #2 was initiated on 09/16/2011. Cycle #3 was initiated on 10/15/2011. Velcade was placed on hold due to neuropathy. 2. The serum free lambda light chains were decreased on October 08 2011. 3. The serum free lambda light chains were slightly increased on 12/14/2011, lower on 12/29/2011 and 02/02/2012. 4. Initiation of Revlimid/Decadron February 2013, cycle 2 started on 01/28/2012. 5. High-dose melphalan chemotherapy followed by autologous stem cell infusion on 02/09/2013 6. Initiation of Velcade/Decadrontherapy 05/28/2013, he completed 2 cycles on a day 1, 4, 8, 11 schedule 7. Improvement in the serum free lambda light chains following induction Velcade/Decadron  8. Initiation of weekly Velcade/Decadron on 07/16/2013, last treatment on 10/16/2013 9. Improvement in the serum free lambda light chains on 08/21/2013 and 09/18/2013 ([facility]) 10. Normal serum free lambda light chains on 12/13/2013 11. Mild increase of the serum free lambda light chains 02/26/2014, stable 06/11/2014 15. Loss of "balance "and proximal motor weakness secondary to cervical stenosis-status post decompression surgery on 02/19/2012. Improved, but not resolved stenosis with mass effect on the spinal cord noted on a repeat MRI 03/28/2012. He underwent further decompression surgery on 04/06/2012. The ataxia, weakness, and peripheral numbness is much improved.  16. Right lower Extremity deep vein thrombosis 02/02/2012-a Doppler ultrasound confirmed  a gastrocnemius and peroneal deep vein thrombosis. An IVC filter was placed prior to surgery  and he was maintained on Lovenox. The IVC filter was removed on 05/04/2012.  17. Anorexia following cervical spine surgery. Improved.  18. Episode of flank pain and hematuria in 03/31/2012-etiology unclear. He completed a course of antibiotics. The hematuria and pain have resolved.  19. Rectal wall thickening noted on a CT of the pelvis 03/31/2012.  20. colonoscopy showed an area of abnormality at the rectum with biopsy positive for amyloid.  21. Lung nodule noted on the CT 03/31/2012 , no lung nodule on a chest CT 07/30/2013  22. Lumbar stenosis-status post surgery on 07/31/2012.  23. Status post stem cell collection.  24.Rright internal jugular thrombosis, 10/22/2012, likely related to pheresis catheter and HIT  25. Admission with acute right pulmonary embolism 10/27/2012  26. Superior mesenteric vein/portal vein thrombosis diagnosed on a CT of the abdomen 10/28/2012 and 10/29/2012-treated with Argatroban , maintained on Coumadin anticoagulation until he hospital admission March 2014 for stem cell therapy  27. HIT confirmed on a serotonin release assay 10/29/2012  28. History of Renal insufficiency during the December 2013 hospital admission-likely related to dehydration and? Contrast nephropathy , persistent mild elevation of the creatinine  29. Dysphagia-? Secondary to amyloidosis, reported when he was here on 05/08/2013. He did not complain of dysphagia today  30. Anemia secondary to multiple myeloma and chemotherapy , improved  31.right lower Extremity deep vein thrombosis diagnosed at [facility] July 2014 , repeat Doppler at [facility] 07/19/2013 with chronic thrombus in the right gastrocnemius, Eliquis has been discontinued  32. Fall August 2014 with a left shoulder injury, diagnosed with a rotator cuff tear, status post surgical repair 09/19/2013  33. 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 The neuropathy symptoms have partially improved since Velcade was  discontinued after treatment 10/16/2013   Disposition:  He appears stable with regard to the amyloidosis. He will be scheduled for a surveillance echocardiogram. We will follow-up on the serum light chains from today. Brent Allison will return for an office and lab visit in 3 months.  He is scheduled for spine fusion surgery in July. He cannot receive heparin or Lovenox. I will contact Dr. Sherwood Gambler regarding early ambulation and SCDs surrounding surgery. I doubt he will require anticoagulation therapy.  Brent Coder, MD  04/29/2015  12:53 PM

## 2015-04-29 NOTE — Telephone Encounter (Signed)
Gave and printed appt sched and avs for pt for SEPT>..I sent msg to [facility]

## 2015-05-01 ENCOUNTER — Telehealth: Payer: Self-pay | Admitting: *Deleted

## 2015-05-01 LAB — PROTEIN ELECTROPHORESIS, SERUM
Albumin ELP: 3.4 g/dL — ABNORMAL LOW (ref 3.8–4.8)
Alpha-1-Globulin: 0.3 g/dL (ref 0.2–0.3)
Alpha-2-Globulin: 0.9 g/dL (ref 0.5–0.9)
Beta 2: 0.4 g/dL (ref 0.2–0.5)
Beta Globulin: 0.4 g/dL (ref 0.4–0.6)
Gamma Globulin: 1.1 g/dL (ref 0.8–1.7)
Total Protein, Serum Electrophoresis: 6.5 g/dL (ref 6.1–8.1)

## 2015-05-01 LAB — KAPPA/LAMBDA LIGHT CHAINS
Kappa free light chain: 3.83 mg/dL — ABNORMAL HIGH (ref 0.33–1.94)
Kappa:Lambda Ratio: 0.64 (ref 0.26–1.65)
Lambda Free Lght Chn: 5.95 mg/dL — ABNORMAL HIGH (ref 0.57–2.63)

## 2015-05-01 LAB — IGG, IGA, IGM
IgA: 249 mg/dL (ref 68–379)
IgG (Immunoglobin G), Serum: 1170 mg/dL (ref 650–1600)
IgM, Serum: 139 mg/dL (ref 41–251)

## 2015-05-01 NOTE — Telephone Encounter (Signed)
-----   Message from Ladell Pier, MD sent at 05/01/2015  9:21 AM EDT ----- Please call patient, kappa and lambda light chains are mildly elevated, ok to continue observation

## 2015-05-01 NOTE — Telephone Encounter (Signed)
Per Dr. [name]; notified pt that kappa and lambda light chains are mildly elevated, ok to continue observation.  Pt verbalized understanding and expressed appreciation for Dr. [name] keeping "track of this"

## 2015-05-06 ENCOUNTER — Telehealth: Payer: Self-pay | Admitting: *Deleted

## 2015-05-06 ENCOUNTER — Ambulatory Visit (HOSPITAL_COMMUNITY)
Admission: RE | Admit: 2015-05-06 | Discharge: 2015-05-06 | Disposition: A | Discharge status: Home / Self Care | Payer: Medicare Other | Source: Ambulatory Visit | Attending: Oncology | Admitting: Oncology

## 2015-05-06 DIAGNOSIS — Z86711 Personal history of pulmonary embolism: Secondary | ICD-10-CM | POA: Insufficient documentation

## 2015-05-06 DIAGNOSIS — Z86718 Personal history of other venous thrombosis and embolism: Secondary | ICD-10-CM | POA: Insufficient documentation

## 2015-05-06 DIAGNOSIS — I071 Rheumatic tricuspid insufficiency: Secondary | ICD-10-CM | POA: Insufficient documentation

## 2015-05-06 DIAGNOSIS — Z87891 Personal history of nicotine dependence: Secondary | ICD-10-CM | POA: Insufficient documentation

## 2015-05-06 DIAGNOSIS — E8581 Light chain (AL) amyloidosis: Secondary | ICD-10-CM

## 2015-05-06 DIAGNOSIS — E785 Hyperlipidemia, unspecified: Secondary | ICD-10-CM | POA: Insufficient documentation

## 2015-05-06 DIAGNOSIS — E859 Amyloidosis, unspecified: Secondary | ICD-10-CM | POA: Insufficient documentation

## 2015-05-06 DIAGNOSIS — E858 Other amyloidosis: Secondary | ICD-10-CM

## 2015-05-06 NOTE — Telephone Encounter (Signed)
"  They called my results but I can't view them on MyChart."  Informed him they will be released at midnight.

## 2015-05-06 NOTE — Progress Notes (Signed)
*  PRELIMINARY RESULTS* Echocardiogram 2D Echocardiogram has been performed.  Brent Allison 05/06/2015, 10:53 AM

## 2015-05-07 ENCOUNTER — Telehealth: Payer: Self-pay | Admitting: *Deleted

## 2015-05-07 NOTE — Telephone Encounter (Signed)
-----   Message from Ladell Pier, MD sent at 05/06/2015  9:29 PM EDT ----- Please call patient, echo. Reveals left ventricular hypertrophy, normal heart function

## 2015-05-07 NOTE — Telephone Encounter (Signed)
Made pt aware of ECHO results; appreciated information and has upcoming appts

## 2015-05-07 NOTE — Telephone Encounter (Signed)
No answer; left message for pt to call back to discuss ECHO.

## 2015-05-09 ENCOUNTER — Ambulatory Visit (INDEPENDENT_AMBULATORY_CARE_PROVIDER_SITE_OTHER): Payer: Medicare Other | Admitting: Cardiology

## 2015-05-09 ENCOUNTER — Telehealth: Payer: Self-pay | Admitting: *Deleted

## 2015-05-09 ENCOUNTER — Encounter: Payer: Self-pay | Admitting: Cardiology

## 2015-05-09 VITALS — BP 114/64 | HR 64 | Ht 73.0 in | Wt 191.8 lb

## 2015-05-09 DIAGNOSIS — C9000 Multiple myeloma not having achieved remission: Secondary | ICD-10-CM

## 2015-05-09 DIAGNOSIS — E8581 Light chain (AL) amyloidosis: Secondary | ICD-10-CM

## 2015-05-09 DIAGNOSIS — G4733 Obstructive sleep apnea (adult) (pediatric): Secondary | ICD-10-CM | POA: Diagnosis not present

## 2015-05-09 NOTE — Patient Instructions (Signed)

## 2015-05-09 NOTE — Telephone Encounter (Signed)
Called Brent Allison, Dr. [name] discussed case with Dr. Johnsie Cancel. Cardiology will contact Brent Allison to schedule cardiac MRI. Brent Allison voiced understanding, stated he will be out of town until 6/25. Will need appointments scheduled week of 6/27. (Would like to avoid early July due to family obligations.) Back surgery is scheduled 06/02/15. Will forward info to cardiology.

## 2015-05-09 NOTE — Progress Notes (Addendum)
Cardiology Office Note   Date:  05/09/2015   ID:  Brent Allison, DOB [birth year], MRN [id number]  PCP:  Brent Pac, MD    Chief Complaint  Patient presents with  . Sleep Apnea      History of Present Illness: Brent Allison is a 69 y.o. male with a history of OSA on CPAP, dyslipidemia and history of DVT who presents today for followup. He is on CPAP and uses a small nasal pillow mask and tolerates it well. He feels the pressure is adequate. He feels rested when he gets up in the am and has no daytime sleepiness. He snores if he does not use his CPAP. He has no nasal congestion. He denies any mouth dryness.      Past Medical History  Diagnosis Date  . Dyslipidemia   . H/O multiple myeloma   . Spastic quadriparesis   . DVT (deep venous thrombosis)     right  . Pneumonia     hx of with last time in Nov 2012  . Joint pain   . Sleep apnea     severe with AHI 44.4 events per hour - now on CPAP at 11cm H2O  . Prostate cancer     amyloidosis, multiple myeloma   . Arthritis     lumbar DDD  . Peripheral vascular disease     DVT- 01/2012, follows by Dr. [name], lovenox maintained since Spring 2013 , pt. aware that last dose is sch. for 07/22/2012  . Blood dyscrasia     plasma cell dyscrasia with associated amyloidosis  . AL amyloidosis 11/04/2012  . Heparin induced thrombocytopenia (HIT) 11/04/2012    Past Surgical History  Procedure Laterality Date  . Prostatectomy  around 2005  . Posterior laminectomy / decompression lumbar spine  around 2007    L4-5  . Carpal tunnel release  2009    left  . Back surgery    . Anterior cervical decomp/discectomy fusion  02/26/2012    Procedure: ANTERIOR CERVICAL DECOMPRESSION/DISCECTOMY FUSION 2 LEVELS;  Surgeon: Hosie Spangle, MD;  Location: [facility];  Service: Neurosurgery;  Laterality: N/A;  C3-4 C4-5 Anterior cervical decompression/diskectomy, fusion  . Tonsillectomy      as a child    . Hernia repair      around 1985  . Cardiac catheterization  2010  . Colonoscopy    . Lipoma excision  around 1985    removed from one of his shoulders  . Posterior cervical fusion/foraminotomy  04/06/2012    Procedure: POSTERIOR CERVICAL FUSION/FORAMINOTOMY LEVEL 3;  Surgeon: Hosie Spangle, MD;  Location: [facility];  Service: Neurosurgery;  Laterality: N/A;  C3 and C4 laminectomy with C23 C34 C45 posterior cervical arthrodesis with instrumentation  . Ivt      IVC filter placed d/t blood clots, removed- July- 2013     No current outpatient prescriptions on file.   No current facility-administered medications for this visit.    Allergies:   Enoxaparin and Heparin    Social History:  The patient  reports that he quit smoking about 35 years ago. He has never used smokeless tobacco. He reports that he drinks about 0.6 oz of alcohol per week. He reports that he does not use illicit drugs.   Family History:  The patient's family history includes Pancreatic cancer in his mother. There is no history of Anesthesia  problems, Hypotension, Malignant hyperthermia, or Pseudochol deficiency.    ROS:  Please see the history of present illness.   Otherwise, review of systems are positive for none.   All other systems are reviewed and negative.    PHYSICAL EXAM: VS:  BP 114/64 mmHg  Pulse 64  Ht 6' 1"  (1.854 m)  Wt 191 lb 12.8 oz (87 kg)  BMI 25.31 kg/m2  SpO2 95% , BMI Body mass index is 25.31 kg/(m^2). GEN: Well nourished, well developed, in no acute distress HEENT: normal Neck: no JVD, carotid bruits, or masses Cardiac: RRR; no murmurs, rubs, or gallops,no edema  Respiratory:  clear to auscultation bilaterally, normal work of breathing GI: soft, nontender, nondistended, + BS MS: no deformity or atrophy Skin: warm and dry, no rash Neuro:  Strength and sensation are intact Psych: euthymic mood, full affect   EKG:  EKG is not ordered today.    Recent Labs: 04/29/2015: ALT 8;  BUN 21.8; Creatinine 1.3; HGB 14.1; Platelets 167; Potassium 4.5; Sodium 138    Lipid Panel    Component Value Date/Time   CHOL 84 11/06/2012 0600   TRIG 30 11/06/2012 0600      Wt Readings from Last 3 Encounters:  05/09/15 191 lb 12.8 oz (87 kg)  04/29/15 187 lb 1.6 oz (84.868 kg)  02/27/15 199 lb 6.4 oz (90.447 kg)     ASSESSMENT AND PLAN:  1. Obstructive sleep apnea on CPAP - he is tolerating his CPAP device well. He will continue on current settings. His d/l today showed an AHI of 1.7/hr on 11cm H2O and 100% compliance in using more than 4 hours nightly.  Current medicines are reviewed at length with the patient today.  The patient does not have concerns regarding medicines.  The following changes have been made:  no change  Labs/ tests ordered today: See above Assessment and Plan No orders of the defined types were placed in this encounter.     Disposition:   FU with me in 1 year  Signed, Sueanne Margarita, MD  05/09/2015 11:05 AM    [facility] [address], [place], [place]  [address] Phone: [phone]; Fax: [phone]

## 2015-05-09 NOTE — Telephone Encounter (Signed)
-----   Message from Ladell Pier, MD sent at 05/09/2015  3:31 PM EDT ----- Please call patient, heart function is normal, left ventricle is hypertrophied, I will discuss with cardiology to see if he needs further evaluation. Discussed with Dr. Sherwood Gambler

## 2015-05-10 NOTE — Addendum Note (Signed)
Addended by: Fransico Him R on: 05/10/2015 05:15 PM   Modules accepted: Miquel Dunn

## 2015-05-12 ENCOUNTER — Other Ambulatory Visit: Payer: Self-pay | Admitting: *Deleted

## 2015-05-12 ENCOUNTER — Telehealth: Payer: Self-pay | Admitting: *Deleted

## 2015-05-12 DIAGNOSIS — E859 Amyloidosis, unspecified: Secondary | ICD-10-CM

## 2015-05-12 NOTE — Telephone Encounter (Signed)
[name] >','<< Less Detail',event)" href="javascript:;">More Detail >>   [name], MD   Sent: Fri May 09, 2015 4:16 PM    To: Richmond Campbell, LPN        Message     Patient needs cardiac MRI for me to read. ? Amyloidosis with contrast/gadolinium      Gayland Curry III  ECHO COMPLETE WO IMAGE ENHANCING AGENT  Order# [id number]   Reading physician: Brent Spark, MD  Ordering physician: Ladell Pier, MD  Study date: 05/06/15         Narrative                   *[facility] [employer].            [place], [place] [address]              [phone]  ------------------------------------------------------------------- Transthoracic Echocardiography  Patient:  Brent Allison MR #:    [id number] Study Date: 05/06/2015 Gender:   M Age:    69 Height:   185.4 cm Weight:   84.8 kg BSA:    2.1 m^2 Pt. Status: Room:  ATTENDING  [name] REFERRING  [place], [place], Outpatient  cc:  ------------------------------------------------------------------- LV EF: 55% -  60%  ------------------------------------------------------------------- History:  PMH: Pulmonary Embolism, DVT, Former Smoker, Amyloidosis. Amyloidosis. Risk factors: Dyslipidemia.  ------------------------------------------------------------------- Study Conclusions  - Left ventricle: Abnormal strain parameters: Lateral S prime: 6 cm/sec. Global longitudinal strain: -14.9 %. The cavity size was normal. There was severe concentric hypertrophy. Systolic function was normal. The estimated ejection fraction was in the range of 55% to 60%. Wall motion was normal; there were no regional wall motion abnormalities.  Doppler parameters are consistent with abnormal left ventricular relaxation (grade 1 diastolic dysfunction). There was no evidence of elevated ventricular filling pressure by Doppler parameters. - Aortic valve: Trileaflet; normal thickness leaflets. There was no regurgitation. - Aortic root: The aortic root was normal in size. - Mitral valve: Structurally normal valve. There was trivial regurgitation. - Left atrium: The atrium was normal in size. - Right ventricle: Systolic function was normal. - Right atrium: The atrium was normal in size. - Pulmonic valve: There was no regurgitation. - Pulmonary arteries: Systolic pressure was within the normal range. - Inferior vena cava: The vessel was normal in size. - Pericardium, extracardiac: There was no pericardial effusion.  Transthoracic echocardiography. M-mode, complete 2D, spectral Doppler, and color Doppler. Birthdate: Patient birthdate: 1946-02-19. Age: Patient is 69 yr old. Sex: Gender: male. BMI: 24.7 kg/m^2. Patient status: Outpatient. Study date: Study date: 05/06/2015. Study time: 10:21 AM. Location: Echo laboratory.  -------------------------------------------------------------------  ------------------------------------------------------------------- Left ventricle: Abnormal strain parameters: Lateral S prime: 6 cm/sec. Global longitudinal strain: -14.9 %. The cavity size was normal. There was severe concentric hypertrophy. Systolic function was normal. The estimated ejection fraction was in the range of 55% to 60%. Wall motion was normal; there were no regional wall motion abnormalities. Doppler parameters are consistent with abnormal left ventricular relaxation (grade 1 diastolic dysfunction). There was no evidence of elevated ventricular filling pressure by Doppler parameters.  ------------------------------------------------------------------- Aortic valve:  Trileaflet; normal thickness  leaflets. Mobility was not restricted. Doppler: Transvalvular velocity was within the normal range. There was no  stenosis. There was no regurgitation.  ------------------------------------------------------------------- Aorta: Aortic root: The aortic root was normal in size.  ------------------------------------------------------------------- Mitral valve:  Structurally normal valve.  Mobility was not restricted. Doppler: Transvalvular velocity was within the normal range. There was no evidence for stenosis. There was trivial regurgitation.  Peak gradient (D): 3 mm Hg.  ------------------------------------------------------------------- Left atrium: The atrium was normal in size.  ------------------------------------------------------------------- Right ventricle: The cavity size was normal. Wall thickness was normal. Systolic function was normal.  ------------------------------------------------------------------- Pulmonic valve:  Structurally normal valve.  Cusp separation was normal. Doppler: Transvalvular velocity was within the normal range. There was no evidence for stenosis. There was no regurgitation.  ------------------------------------------------------------------- Tricuspid valve:  Structurally normal valve.  Doppler: Transvalvular velocity was within the normal range. There was mild regurgitation.  ------------------------------------------------------------------- Pulmonary artery:  The main pulmonary artery was normal-sized. Systolic pressure was within the normal range.  ------------------------------------------------------------------- Right atrium: The atrium was normal in size.  ------------------------------------------------------------------- Pericardium: There was no pericardial effusion.  ------------------------------------------------------------------- Systemic veins: Inferior vena cava: The vessel was normal in  size.  ------------------------------------------------------------------- Measurements  Left ventricle            Value    Reference LV ID, ED, PLAX chordal    (L)  36.6 mm   43 - 52 LV ID, ES, PLAX chordal        28.9 mm   23 - 38 LV fx shortening, PLAX chordal (L)  21  %   >=29 LV PW thickness, ED          15.6 mm   --------- IVS/LV PW ratio, ED          1.04     <=1.3 LV e&', lateral            7.51 cm/s  --------- LV E/e&', lateral           11.94    --------- LV e&', medial             4.46 cm/s  --------- LV E/e&', medial            20.11    --------- LV e&', average            5.99 cm/s  --------- LV E/e&', average           14.99    --------- Longitudinal strain, TDI       15  %   ---------  Ventricular septum          Value    Reference IVS thickness, ED           16.3 mm   ---------  Aorta                 Value    Reference Aortic root ID, ED          35  mm   ---------  Left atrium              Value    Reference LA ID, A-P, ES            37  mm   --------- LA ID/bsa, A-P            1.77 cm/m^2 <=2.2 LA volume, S             65.1 ml   --------- LA volume/bsa, S           31.1 ml/m^2 --------- LA volume, ES, 1-p A4C  60.9 ml   --------- LA volume/bsa, ES, 1-p A4C      29.1 ml/m^2 --------- LA volume, ES, 1-p A2C        70.4 ml   --------- LA volume/bsa, ES, 1-p A2C      33.6 ml/m^2 ---------  Mitral valve             Value    Reference Mitral E-wave peak velocity      89.7 cm/s  --------- Mitral A-wave peak velocity      86.8 cm/s  --------- Mitral deceleration time     (H)  250  ms   150 - 230 Mitral peak gradient, D        3   mm Hg --------- Mitral E/A ratio, peak        1      ---------  Systemic veins            Value    Reference Estimated CVP             3   mm Hg ---------  Legend: (L) and (H) mark values outside specified reference range.  ------------------------------------------------------------------- Prepared and Electronically Authenticated by  Brent Allison, M.D. 2016-06-14T13:37:28       Order-Level Documents:     There are no order-level documents.     Encounter-Level Documents - 05/06/2015:       Electronic signature on 05/06/2015 10:09 AM       PACS Images     Show images for ECHOCARDIOGRAM COMPLETE       Signed     Electronically signed by Brent Spark, MD on 05/06/15 at 1337 EDT       Printable Result Report     Result Report       ECHOCARDIOGRAM COMPLETE (Order [id number])  Echocardiography  Order: [id number]   Released By: Minette Brine  Authorizing: Ladell Pier, MD   Date: 05/06/2015  Department: [facility] ULTRASOUND       Procedure Abnormality    Status    ECHOCARDIOGRAM COMPLETE       Order Information     Order Date/Time Release Date/Time    05/06/15 10:09 AM 05/06/15 10:09 AM    Start Date/Time: 05/06/15 10:09 AM    End Date/Time: 05/06/2015        Order Details     Frequency Duration    As needed 1 occurrence    Priority: Routine    Order Class: Ancillary Performed        Acc#    [id number]        Order Questions     Question Answer    Where should this test be performed [facility]    Complete or Limited study? Limited    With Image Enhancing Agent or without Image Enhancing Agent? Without Image Enhancing Agent    Note:  Select Without Image Enhancing Agent only if contraindicated.     Contraindication for Image Enhancing Agent? Per Appointment Conversion    Reason for exam-Echo Other - See Comments Section        Associated Diagnoses       ICD-9-CM ICD-10-CM    AL amyloidosis    277.39 E85.8        Order History  Inpatient    Date/Time Action Taken User    Additional Information    0000 Result Elvia Collum    In process    05/06/15 1009 Release Shauna Leandro Reasoner  FromOrder:[id number]    05/06/15 1337 Result Three One Seven Interface    Final        Order Requisition     ECHOCARDIOGRAM COMPLETE (Order #[id number]) on 05/06/15       Collection Information     Collected: 05/06/2015 10:21 AM      Resulting Agency: [facility]     ECHOCARDIOGRAM COMPLETE (Order #[id number]) on 05/06/15  PT  NOTIFIED  OF  ECHO  RESULTS .Adonis Housekeeper

## 2015-05-13 ENCOUNTER — Telehealth: Payer: Self-pay | Admitting: *Deleted

## 2015-05-13 ENCOUNTER — Telehealth: Payer: Self-pay | Admitting: Cardiovascular Disease

## 2015-05-13 NOTE — Telephone Encounter (Signed)
New Message  Pt called states that he was advised that he would have a follow up call to Schedule MRI. Pt has not received this call as of yet//sr

## 2015-05-13 NOTE — Telephone Encounter (Signed)
VM message received @ 11:11 am form pt regarding scheduling of heart MRI. Call back to patient. Spoke with patient and informed him that cardiology would be scheduling that procedure. Gave pt phone # to Dr. Jenkins Rouge 's office. Pt stated he would call.

## 2015-05-14 ENCOUNTER — Encounter: Payer: Self-pay | Admitting: Cardiovascular Disease

## 2015-05-15 ENCOUNTER — Encounter: Payer: Self-pay | Admitting: Cardiovascular Disease

## 2015-05-19 ENCOUNTER — Telehealth: Payer: Self-pay | Admitting: Oncology

## 2015-05-19 NOTE — Telephone Encounter (Signed)
Pt called to r/s labs due to will be traveling, pt confirmed new D/T for labs... KJ

## 2015-05-21 ENCOUNTER — Telehealth: Payer: Self-pay | Admitting: *Deleted

## 2015-05-21 NOTE — Pre-Procedure Instructions (Signed)
    Gayland Curry III  05/21/2015      [facility] - [place] - [address] [place] [address] Phone: [phone] Fax: [phone]  [place] [address] [place] [address] Phone: [phone] Fax: [phone]    Your procedure is scheduled on 06/02/15.  Report to [facility] Admitting at 9 A.M.  Call this number if you have problems the morning of surgery:  [phone]   Remember:  Do not eat food or drink liquids after midnight.  Take these medicines the morning of surgery with A SIP OF WATER none   Do not wear jewelry, make-up or nail polish.  Do not wear lotions, powders, or perfumes.  You may wear deodorant.  Do not shave 48 hours prior to surgery.  Men may shave face and neck.  Do not bring valuables to the hospital.  [facility] is not responsible for any belongings or valuables.  Contacts, dentures or bridgework may not be worn into surgery.  Leave your suitcase in the car.  After surgery it may be brought to your room.  For patients admitted to the hospital, discharge time will be determined by your treatment team.  Patients discharged the day of surgery will not be allowed to drive home.   Name and phone number of your driver:   Special instructions:    Please read over the following fact sheets that you were given. Pain Booklet, Coughing and Deep Breathing, MRSA Information and Surgical Site Infection Prevention

## 2015-05-21 NOTE — Telephone Encounter (Signed)
TC from patient requesting to re-schedule his MRI. Currently it is scheduled for 05/29/15 and pt is out of town that day. Gave Mr. Kemler patient # to call central scheduling. Also Mr. Rue stated he got a message on MY Chart that he needed to be updated on some of his vaccines. Please advise

## 2015-05-22 ENCOUNTER — Encounter: Payer: Self-pay | Admitting: Cardiology

## 2015-05-22 ENCOUNTER — Encounter (HOSPITAL_COMMUNITY): Payer: Self-pay

## 2015-05-22 ENCOUNTER — Encounter (HOSPITAL_COMMUNITY)
Admission: RE | Admit: 2015-05-22 | Discharge: 2015-05-22 | Disposition: A | Discharge status: Home / Self Care | Payer: Medicare Other | Source: Ambulatory Visit | Attending: Neurosurgery | Admitting: Neurosurgery

## 2015-05-22 ENCOUNTER — Ambulatory Visit (HOSPITAL_COMMUNITY)
Admission: RE | Admit: 2015-05-22 | Discharge: 2015-05-22 | Disposition: A | Discharge status: Home / Self Care | Payer: Medicare Other | Source: Ambulatory Visit | Attending: Cardiovascular Disease | Admitting: Cardiovascular Disease

## 2015-05-22 DIAGNOSIS — C9000 Multiple myeloma not having achieved remission: Secondary | ICD-10-CM | POA: Diagnosis not present

## 2015-05-22 DIAGNOSIS — Z86711 Personal history of pulmonary embolism: Secondary | ICD-10-CM | POA: Diagnosis not present

## 2015-05-22 DIAGNOSIS — Z8546 Personal history of malignant neoplasm of prostate: Secondary | ICD-10-CM | POA: Diagnosis not present

## 2015-05-22 DIAGNOSIS — Z86718 Personal history of other venous thrombosis and embolism: Secondary | ICD-10-CM | POA: Insufficient documentation

## 2015-05-22 DIAGNOSIS — Z981 Arthrodesis status: Secondary | ICD-10-CM | POA: Insufficient documentation

## 2015-05-22 DIAGNOSIS — G8250 Quadriplegia, unspecified: Secondary | ICD-10-CM | POA: Diagnosis not present

## 2015-05-22 DIAGNOSIS — E854 Organ-limited amyloidosis: Secondary | ICD-10-CM | POA: Diagnosis not present

## 2015-05-22 DIAGNOSIS — Z01818 Encounter for other preprocedural examination: Secondary | ICD-10-CM | POA: Insufficient documentation

## 2015-05-22 DIAGNOSIS — G4733 Obstructive sleep apnea (adult) (pediatric): Secondary | ICD-10-CM | POA: Insufficient documentation

## 2015-05-22 DIAGNOSIS — Z01812 Encounter for preprocedural laboratory examination: Secondary | ICD-10-CM | POA: Diagnosis not present

## 2015-05-22 DIAGNOSIS — I43 Cardiomyopathy in diseases classified elsewhere: Secondary | ICD-10-CM | POA: Diagnosis not present

## 2015-05-22 HISTORY — DX: Adverse effect of unspecified anesthetic, initial encounter: T41.45XA

## 2015-05-22 HISTORY — DX: Other complications of anesthesia, initial encounter: T88.59XA

## 2015-05-22 LAB — BASIC METABOLIC PANEL
Anion gap: 8 (ref 5–15)
BUN: 14 mg/dL (ref 6–20)
CO2: 26 mmol/L (ref 22–32)
Calcium: 8.9 mg/dL (ref 8.9–10.3)
Chloride: 104 mmol/L (ref 101–111)
Creatinine, Ser: 1.31 mg/dL — ABNORMAL HIGH (ref 0.61–1.24)
GFR calc Af Amer: >60 mL/min (ref >60)
GFR calc non Af Amer: 54 mL/min — ABNORMAL LOW (ref >60)
Glucose, Bld: 103 mg/dL — ABNORMAL HIGH (ref 65–99)
Potassium: 4.2 mmol/L (ref 3.5–5.1)
Sodium: 138 mmol/L (ref 135–145)

## 2015-05-22 LAB — SURGICAL PCR SCREEN
MRSA, PCR: NEGATIVE
Staphylococcus aureus: POSITIVE — AB

## 2015-05-22 LAB — CBC
HCT: 37.9 % — ABNORMAL LOW (ref 39.0–52.0)
Hemoglobin: 12.5 g/dL — ABNORMAL LOW (ref 13.0–17.0)
MCH: 31.5 pg (ref 26.0–34.0)
MCHC: 33 g/dL (ref 30.0–36.0)
MCV: 95.5 fL (ref 78.0–100.0)
Platelets: 157 10*3/uL (ref 150–400)
RBC: 3.97 MIL/uL — ABNORMAL LOW (ref 4.22–5.81)
RDW: 14 % (ref 11.5–15.5)
WBC: 6.1 10*3/uL (ref 4.0–10.5)

## 2015-05-22 MED ORDER — GADOBENATE DIMEGLUMINE 529 MG/ML IV SOLN
30.0000 mL | Freq: Once | INTRAVENOUS | Status: AC | PRN
  Administered 2015-05-22: 30 mL via INTRAVENOUS

## 2015-05-22 NOTE — Telephone Encounter (Signed)
Complete, Closing Encounter

## 2015-05-23 ENCOUNTER — Inpatient Hospital Stay (HOSPITAL_COMMUNITY): Admission: RE | Admit: 2015-05-23 | Payer: Medicare Other | Source: Ambulatory Visit

## 2015-05-23 ENCOUNTER — Telehealth: Payer: Self-pay | Admitting: *Deleted

## 2015-05-23 NOTE — Telephone Encounter (Signed)
Follow-up re: pt immunization records; notified pt that office did receive documentation that he did get MMR and Pneumococcal 23 @ [facility] 02/26/15 and we will update his records in [place].  Pt verbalized understanding and expressed appreciation

## 2015-05-23 NOTE — Progress Notes (Signed)
Anesthesia Chart Review:  Brent Allison is 69 year old male scheduled for posterior C7-T2 arthrodesis with C7-T2 laminectomies on 06/02/2015 with Dr. Sherwood Gambler.   PMH includes: AL amyloidosis, multiple myeloma, OSA (on CPAP), heparin induced thrombocytopenia, DVT, PE, prostate cancer, spastic quadriparesis. S/p posterior lumbar fusion 07/31/12. S/p posterior cervical fusion/foraminotomy 04/06/12. S/p ACDF 02/26/12.   Brent Allison reports no medications.   Preoperative labs reviewed.    EKG 09/06/2014: sinus rhythm with 1st degree AV block  Cardiac MRI 05/22/2015: 1. Normal left ventricular size with severe concentric hypertrophy and mildly impaired systolic function (LVEF = 45%). There are no regional wall motion abnormalities.There is severe diffuse late gadolinium enhancement of almost all left ventricular segments and partially involving right ventricular wall. 2. Normal right ventricular size, with mild right ventricular hypertrophy and normal systolic function (RVEF = 55%). 3. Mild mitral and tricuspid regurgitation. -Collectively, these findings are consistent with infiltrative cardiomyopathy - cardiac amyloidosis with bi-ventricular Involvement.  Echo 05/06/2015: - Left ventricle: Abnormal strain parameters: Lateral S prime: 6 cm/sec. Global longitudinal strain: -14.9 %. The cavity size was normal. There was severe concentric hypertrophy. Systolic function was normal. The estimated ejection fraction was in the range of 55% to 60%. Wall motion was normal; there were no regional wall motion abnormalities. Doppler parameters are consistent with abnormal left ventricular relaxation (grade 1 diastolic dysfunction). There was no evidence of elevated ventricular filling pressure by Doppler parameters. - Aortic valve: Trileaflet; normal thickness leaflets. There was no regurgitation. - Aortic root: The aortic root was normal in size. - Mitral valve: Structurally normal valve. There was trivial regurgitation. - Left  atrium: The atrium was normal in size. - Right ventricle: Systolic function was normal. - Right atrium: The atrium was normal in size. - Pulmonic valve: There was no regurgitation. - Pulmonary arteries: Systolic pressure was within the normal range. - Inferior vena cava: The vessel was normal in size. - Pericardium, extracardiac: There was no pericardial effusion.  Cardiac cath 11/17/2009: 1. No significant coronary artery disease 2. Normal ventricular function 3. No abdominal aortic aneuryms 4. Normal hemodynamics.   Dr. [name] is Brent Allison's hem-onc. Last office visit 04/29/15. Dr. [name] is aware of Brent Allison's upcoming surgery. Dr. [name] notes Brent Allison "cannot receive heparin or lovenox" due to HIT.   Reviewed case with Dr. Linna Caprice.   If no changes, I anticipate Brent Allison can proceed with surgery as scheduled.   Willeen Cass, FNP-BC [facility]/Anesthesiology Phone: [phone] 05/23/2015 4:08 PM

## 2015-05-29 ENCOUNTER — Ambulatory Visit (HOSPITAL_COMMUNITY): Payer: Medicare Other

## 2015-06-02 ENCOUNTER — Encounter (HOSPITAL_COMMUNITY): Payer: Self-pay | Admitting: *Deleted

## 2015-06-02 ENCOUNTER — Inpatient Hospital Stay (HOSPITAL_COMMUNITY): Payer: Medicare Other | Admitting: Vascular Surgery

## 2015-06-02 ENCOUNTER — Encounter (HOSPITAL_COMMUNITY)
Admission: RE | Disposition: A | Discharge status: Home / Self Care | Payer: Self-pay | Source: Ambulatory Visit | Attending: Neurosurgery

## 2015-06-02 ENCOUNTER — Inpatient Hospital Stay (HOSPITAL_COMMUNITY)
Admission: RE | Admit: 2015-06-02 | Discharge: 2015-06-04 | DRG: 460 | Disposition: A | Discharge status: Home / Self Care | Payer: Medicare Other | Source: Ambulatory Visit | Attending: Neurosurgery | Admitting: Neurosurgery

## 2015-06-02 ENCOUNTER — Inpatient Hospital Stay (HOSPITAL_COMMUNITY): Payer: Medicare Other | Admitting: Anesthesiology

## 2015-06-02 ENCOUNTER — Inpatient Hospital Stay (HOSPITAL_COMMUNITY): Payer: Medicare Other

## 2015-06-02 DIAGNOSIS — M4806 Spinal stenosis, lumbar region: Secondary | ICD-10-CM | POA: Diagnosis not present

## 2015-06-02 DIAGNOSIS — Z800 Family history of malignant neoplasm of digestive organs: Secondary | ICD-10-CM | POA: Diagnosis not present

## 2015-06-02 DIAGNOSIS — Z981 Arthrodesis status: Secondary | ICD-10-CM | POA: Diagnosis not present

## 2015-06-02 DIAGNOSIS — Z86711 Personal history of pulmonary embolism: Secondary | ICD-10-CM

## 2015-06-02 DIAGNOSIS — G4733 Obstructive sleep apnea (adult) (pediatric): Secondary | ICD-10-CM | POA: Diagnosis present

## 2015-06-02 DIAGNOSIS — C9000 Multiple myeloma not having achieved remission: Secondary | ICD-10-CM | POA: Diagnosis not present

## 2015-06-02 DIAGNOSIS — E785 Hyperlipidemia, unspecified: Secondary | ICD-10-CM | POA: Diagnosis present

## 2015-06-02 DIAGNOSIS — D7589 Other specified diseases of blood and blood-forming organs: Secondary | ICD-10-CM | POA: Diagnosis present

## 2015-06-02 DIAGNOSIS — R008 Other abnormalities of heart beat: Secondary | ICD-10-CM | POA: Diagnosis not present

## 2015-06-02 DIAGNOSIS — M4803 Spinal stenosis, cervicothoracic region: Secondary | ICD-10-CM | POA: Diagnosis not present

## 2015-06-02 DIAGNOSIS — Z888 Allergy status to other drugs, medicaments and biological substances status: Secondary | ICD-10-CM | POA: Diagnosis not present

## 2015-06-02 DIAGNOSIS — E859 Amyloidosis, unspecified: Secondary | ICD-10-CM | POA: Diagnosis not present

## 2015-06-02 DIAGNOSIS — I739 Peripheral vascular disease, unspecified: Secondary | ICD-10-CM | POA: Diagnosis present

## 2015-06-02 DIAGNOSIS — M4804 Spinal stenosis, thoracic region: Secondary | ICD-10-CM | POA: Diagnosis present

## 2015-06-02 DIAGNOSIS — Z87891 Personal history of nicotine dependence: Secondary | ICD-10-CM | POA: Diagnosis not present

## 2015-06-02 DIAGNOSIS — G629 Polyneuropathy, unspecified: Secondary | ICD-10-CM | POA: Diagnosis present

## 2015-06-02 DIAGNOSIS — M4322 Fusion of spine, cervical region: Secondary | ICD-10-CM | POA: Diagnosis not present

## 2015-06-02 DIAGNOSIS — Z8546 Personal history of malignant neoplasm of prostate: Secondary | ICD-10-CM

## 2015-06-02 DIAGNOSIS — M4713 Other spondylosis with myelopathy, cervicothoracic region: Secondary | ICD-10-CM | POA: Diagnosis not present

## 2015-06-02 DIAGNOSIS — M1990 Unspecified osteoarthritis, unspecified site: Secondary | ICD-10-CM | POA: Diagnosis not present

## 2015-06-02 DIAGNOSIS — Z419 Encounter for procedure for purposes other than remedying health state, unspecified: Secondary | ICD-10-CM

## 2015-06-02 HISTORY — PX: POSTERIOR CERVICAL FUSION/FORAMINOTOMY: SHX5038

## 2015-06-02 LAB — COMPREHENSIVE METABOLIC PANEL
ALT: 13 U/L — ABNORMAL LOW (ref 17–63)
AST: 22 U/L (ref 15–41)
Albumin: 3.3 g/dL — ABNORMAL LOW (ref 3.5–5.0)
Alkaline Phosphatase: 73 U/L (ref 38–126)
Anion gap: 10 (ref 5–15)
BUN: 23 mg/dL — ABNORMAL HIGH (ref 6–20)
CO2: 21 mmol/L — ABNORMAL LOW (ref 22–32)
Calcium: 8.6 mg/dL — ABNORMAL LOW (ref 8.9–10.3)
Chloride: 105 mmol/L (ref 101–111)
Creatinine, Ser: 1.44 mg/dL — ABNORMAL HIGH (ref 0.61–1.24)
GFR calc Af Amer: 55 mL/min — ABNORMAL LOW (ref >60)
GFR calc non Af Amer: 48 mL/min — ABNORMAL LOW (ref >60)
Glucose, Bld: 192 mg/dL — ABNORMAL HIGH (ref 65–99)
Potassium: 3.9 mmol/L (ref 3.5–5.1)
Sodium: 136 mmol/L (ref 135–145)
Total Bilirubin: 0.8 mg/dL (ref 0.3–1.2)
Total Protein: 5.7 g/dL — ABNORMAL LOW (ref 6.5–8.1)

## 2015-06-02 LAB — TROPONIN I: Troponin I: <0.03 ng/mL (ref <0.031)

## 2015-06-02 LAB — CBC WITH DIFFERENTIAL/PLATELET
Basophils Absolute: 0 10*3/uL (ref 0.0–0.1)
Basophils Relative: 0 % (ref 0–1)
Eosinophils Absolute: 0 10*3/uL (ref 0.0–0.7)
Eosinophils Relative: 0 % (ref 0–5)
HCT: 34.7 % — ABNORMAL LOW (ref 39.0–52.0)
Hemoglobin: 11.5 g/dL — ABNORMAL LOW (ref 13.0–17.0)
Lymphocytes Relative: 5 % — ABNORMAL LOW (ref 12–46)
Lymphs Abs: 0.6 10*3/uL — ABNORMAL LOW (ref 0.7–4.0)
MCH: 31.3 pg (ref 26.0–34.0)
MCHC: 33.1 g/dL (ref 30.0–36.0)
MCV: 94.6 fL (ref 78.0–100.0)
Monocytes Absolute: 0.2 10*3/uL (ref 0.1–1.0)
Monocytes Relative: 2 % — ABNORMAL LOW (ref 3–12)
Neutro Abs: 9.9 10*3/uL — ABNORMAL HIGH (ref 1.7–7.7)
Neutrophils Relative %: 93 % — ABNORMAL HIGH (ref 43–77)
Platelets: 135 10*3/uL — ABNORMAL LOW (ref 150–400)
RBC: 3.67 MIL/uL — ABNORMAL LOW (ref 4.22–5.81)
RDW: 14 % (ref 11.5–15.5)
WBC: 10.6 10*3/uL — ABNORMAL HIGH (ref 4.0–10.5)

## 2015-06-02 SURGERY — POSTERIOR CERVICAL FUSION/FORAMINOTOMY LEVEL 2
Anesthesia: General | Site: Spine Thoracic

## 2015-06-02 MED ORDER — MAGNESIUM HYDROXIDE 400 MG/5ML PO SUSP: 30.0000 mL | Freq: Every day | ORAL | Status: DC | PRN

## 2015-06-02 MED ORDER — ACETAMINOPHEN 10 MG/ML IV SOLN
INTRAVENOUS | Status: AC
  Administered 2015-06-02: 1000 mg via INTRAVENOUS
  Filled 2015-06-02: qty 100

## 2015-06-02 MED ORDER — 0.9 % SODIUM CHLORIDE (POUR BTL) OPTIME
TOPICAL | Status: DC | PRN
  Administered 2015-06-02: 1000 mL

## 2015-06-02 MED ORDER — SODIUM CHLORIDE 0.9 % IJ SOLN
INTRAMUSCULAR | Status: AC
  Filled 2015-06-02: qty 10

## 2015-06-02 MED ORDER — ZOLPIDEM TARTRATE 5 MG PO TABS: 5.0000 mg | ORAL_TABLET | Freq: Every evening | ORAL | Status: DC | PRN

## 2015-06-02 MED ORDER — NEOSTIGMINE METHYLSULFATE 10 MG/10ML IV SOLN
INTRAVENOUS | Status: AC
  Filled 2015-06-02: qty 1

## 2015-06-02 MED ORDER — ONDANSETRON HCL 4 MG/2ML IJ SOLN
INTRAMUSCULAR | Status: DC | PRN
  Administered 2015-06-02: 4 mg via INTRAVENOUS

## 2015-06-02 MED ORDER — PROPOFOL 10 MG/ML IV BOLUS
INTRAVENOUS | Status: AC
  Filled 2015-06-02: qty 20

## 2015-06-02 MED ORDER — VECURONIUM BROMIDE 10 MG IV SOLR
INTRAVENOUS | Status: DC | PRN
  Administered 2015-06-02 (×2): 1 mg via INTRAVENOUS

## 2015-06-02 MED ORDER — HYDROMORPHONE HCL 1 MG/ML IJ SOLN
0.2500 mg | INTRAMUSCULAR | Status: DC | PRN
  Administered 2015-06-02 (×3): 0.5 mg via INTRAVENOUS

## 2015-06-02 MED ORDER — ONDANSETRON HCL 4 MG PO TABS: 4.0000 mg | ORAL_TABLET | Freq: Four times a day (QID) | ORAL | Status: DC | PRN

## 2015-06-02 MED ORDER — OXYCODONE HCL 5 MG/5ML PO SOLN: 5.0000 mg | Freq: Once | ORAL | Status: DC | PRN

## 2015-06-02 MED ORDER — PROPOFOL 10 MG/ML IV BOLUS
INTRAVENOUS | Status: DC | PRN
  Administered 2015-06-02: 50 mg via INTRAVENOUS
  Administered 2015-06-02: 150 mg via INTRAVENOUS

## 2015-06-02 MED ORDER — HYDROMORPHONE HCL 1 MG/ML IJ SOLN
INTRAMUSCULAR | Status: AC
  Administered 2015-06-02: 0.5 mg via INTRAVENOUS
  Filled 2015-06-02: qty 1

## 2015-06-02 MED ORDER — THROMBIN 20000 UNITS EX SOLR
CUTANEOUS | Status: DC | PRN
  Administered 2015-06-02: 15:00:00 via TOPICAL

## 2015-06-02 MED ORDER — OXYCODONE-ACETAMINOPHEN 5-325 MG PO TABS
1.0000 | ORAL_TABLET | ORAL | Status: DC | PRN
  Administered 2015-06-03 – 2015-06-04 (×2): 2 via ORAL
  Filled 2015-06-02 (×2): qty 2

## 2015-06-02 MED ORDER — FENTANYL CITRATE (PF) 250 MCG/5ML IJ SOLN
INTRAMUSCULAR | Status: AC
  Filled 2015-06-02: qty 5

## 2015-06-02 MED ORDER — BISACODYL 10 MG RE SUPP: 10.0000 mg | Freq: Every day | RECTAL | Status: DC | PRN

## 2015-06-02 MED ORDER — LACTATED RINGERS IV SOLN
INTRAVENOUS | Status: DC
  Administered 2015-06-02 (×4): via INTRAVENOUS

## 2015-06-02 MED ORDER — KETOROLAC TROMETHAMINE 30 MG/ML IJ SOLN
30.0000 mg | Freq: Four times a day (QID) | INTRAMUSCULAR | Status: DC
  Administered 2015-06-02 – 2015-06-03 (×3): 30 mg via INTRAVENOUS
  Filled 2015-06-02 (×7): qty 1

## 2015-06-02 MED ORDER — FAMOTIDINE IN NACL 20-0.9 MG/50ML-% IV SOLN
20.0000 mg | INTRAVENOUS | Status: DC
  Administered 2015-06-02: 20 mg via INTRAVENOUS
  Filled 2015-06-02 (×2): qty 50

## 2015-06-02 MED ORDER — BACITRACIN ZINC 500 UNIT/GM EX OINT
TOPICAL_OINTMENT | CUTANEOUS | Status: DC | PRN
  Administered 2015-06-02: 1 via TOPICAL

## 2015-06-02 MED ORDER — THROMBIN 5000 UNITS EX SOLR
OROMUCOSAL | Status: DC | PRN
  Administered 2015-06-02 (×2): via TOPICAL

## 2015-06-02 MED ORDER — SODIUM CHLORIDE 0.9 % IR SOLN
Status: DC | PRN
  Administered 2015-06-02 (×2)

## 2015-06-02 MED ORDER — ARTIFICIAL TEARS OP OINT
TOPICAL_OINTMENT | OPHTHALMIC | Status: AC
  Filled 2015-06-02: qty 3.5

## 2015-06-02 MED ORDER — PHENYLEPHRINE HCL 10 MG/ML IJ SOLN
INTRAMUSCULAR | Status: AC
  Filled 2015-06-02: qty 1

## 2015-06-02 MED ORDER — ACETAMINOPHEN 650 MG RE SUPP: 650.0000 mg | RECTAL | Status: DC | PRN

## 2015-06-02 MED ORDER — HYDROXYZINE HCL 50 MG/ML IM SOLN
50.0000 mg | INTRAMUSCULAR | Status: DC | PRN
  Filled 2015-06-02: qty 1

## 2015-06-02 MED ORDER — ARTIFICIAL TEARS OP OINT
TOPICAL_OINTMENT | OPHTHALMIC | Status: DC | PRN
  Administered 2015-06-02: 1 via OPHTHALMIC

## 2015-06-02 MED ORDER — LIDOCAINE-EPINEPHRINE 1 %-1:100000 IJ SOLN
INTRAMUSCULAR | Status: DC | PRN
  Administered 2015-06-02: 10 mL

## 2015-06-02 MED ORDER — SODIUM CHLORIDE 0.9 % IJ SOLN
3.0000 mL | Freq: Two times a day (BID) | INTRAMUSCULAR | Status: DC
  Administered 2015-06-02 – 2015-06-03 (×3): 3 mL via INTRAVENOUS

## 2015-06-02 MED ORDER — PHENYLEPHRINE HCL 10 MG/ML IJ SOLN
INTRAMUSCULAR | Status: DC | PRN
  Administered 2015-06-02 (×4): 80 ug via INTRAVENOUS
  Administered 2015-06-02 (×5): 40 ug via INTRAVENOUS
  Administered 2015-06-02: 80 ug via INTRAVENOUS

## 2015-06-02 MED ORDER — SODIUM CHLORIDE 0.9 % IV SOLN: 250.0000 mL | INTRAVENOUS | Status: DC

## 2015-06-02 MED ORDER — FENTANYL CITRATE (PF) 100 MCG/2ML IJ SOLN
INTRAMUSCULAR | Status: DC | PRN
  Administered 2015-06-02 (×7): 50 ug via INTRAVENOUS

## 2015-06-02 MED ORDER — SODIUM CHLORIDE 0.9 % IJ SOLN: 3.0000 mL | INTRAMUSCULAR | Status: DC | PRN

## 2015-06-02 MED ORDER — ROCURONIUM BROMIDE 50 MG/5ML IV SOLN
INTRAVENOUS | Status: AC
  Filled 2015-06-02: qty 1

## 2015-06-02 MED ORDER — KCL IN DEXTROSE-NACL 20-5-0.45 MEQ/L-%-% IV SOLN
INTRAVENOUS | Status: DC
  Administered 2015-06-02 – 2015-06-03 (×2): via INTRAVENOUS
  Filled 2015-06-02 (×7): qty 1000

## 2015-06-02 MED ORDER — ROCURONIUM BROMIDE 100 MG/10ML IV SOLN
INTRAVENOUS | Status: DC | PRN
  Administered 2015-06-02 (×3): 10 mg via INTRAVENOUS
  Administered 2015-06-02: 40 mg via INTRAVENOUS
  Administered 2015-06-02: 20 mg via INTRAVENOUS

## 2015-06-02 MED ORDER — PHENOL 1.4 % MT LIQD: 1.0000 | OROMUCOSAL | Status: DC | PRN

## 2015-06-02 MED ORDER — EPHEDRINE SULFATE 50 MG/ML IJ SOLN
INTRAMUSCULAR | Status: AC
  Filled 2015-06-02: qty 1

## 2015-06-02 MED ORDER — PROMETHAZINE HCL 25 MG/ML IJ SOLN: 6.2500 mg | INTRAMUSCULAR | Status: DC | PRN

## 2015-06-02 MED ORDER — KETOROLAC TROMETHAMINE 30 MG/ML IJ SOLN
30.0000 mg | Freq: Once | INTRAMUSCULAR | Status: AC
  Administered 2015-06-02: 30 mg via INTRAVENOUS

## 2015-06-02 MED ORDER — MENTHOL 3 MG MT LOZG: 1.0000 | LOZENGE | OROMUCOSAL | Status: DC | PRN

## 2015-06-02 MED ORDER — ONDANSETRON HCL 4 MG/2ML IJ SOLN
INTRAMUSCULAR | Status: AC
  Filled 2015-06-02: qty 2

## 2015-06-02 MED ORDER — ACETAMINOPHEN 325 MG PO TABS: 650.0000 mg | ORAL_TABLET | ORAL | Status: DC | PRN

## 2015-06-02 MED ORDER — LIDOCAINE HCL (CARDIAC) 20 MG/ML IV SOLN
INTRAVENOUS | Status: DC | PRN
  Administered 2015-06-02: 100 mg via INTRAVENOUS

## 2015-06-02 MED ORDER — LIDOCAINE HCL (CARDIAC) 20 MG/ML IV SOLN
INTRAVENOUS | Status: AC
  Filled 2015-06-02: qty 10

## 2015-06-02 MED ORDER — BUPIVACAINE HCL (PF) 0.5 % IJ SOLN
INTRAMUSCULAR | Status: DC | PRN
  Administered 2015-06-02: 10 mL

## 2015-06-02 MED ORDER — MIDAZOLAM HCL 2 MG/2ML IJ SOLN
INTRAMUSCULAR | Status: AC
  Filled 2015-06-02: qty 2

## 2015-06-02 MED ORDER — DEXAMETHASONE SODIUM PHOSPHATE 10 MG/ML IJ SOLN
INTRAMUSCULAR | Status: AC
  Filled 2015-06-02: qty 1

## 2015-06-02 MED ORDER — ALBUMIN HUMAN 5 % IV SOLN
INTRAVENOUS | Status: DC | PRN
  Administered 2015-06-02: 14:00:00 via INTRAVENOUS

## 2015-06-02 MED ORDER — CYCLOBENZAPRINE HCL 10 MG PO TABS
10.0000 mg | ORAL_TABLET | Freq: Three times a day (TID) | ORAL | Status: DC | PRN
  Filled 2015-06-02: qty 1

## 2015-06-02 MED ORDER — DEXAMETHASONE SODIUM PHOSPHATE 10 MG/ML IJ SOLN
INTRAMUSCULAR | Status: DC | PRN
  Administered 2015-06-02: 10 mg via INTRAVENOUS

## 2015-06-02 MED ORDER — GLYCOPYRROLATE 0.2 MG/ML IJ SOLN
INTRAMUSCULAR | Status: AC
  Filled 2015-06-02: qty 3

## 2015-06-02 MED ORDER — HYDROXYZINE HCL 25 MG PO TABS
50.0000 mg | ORAL_TABLET | ORAL | Status: DC | PRN
  Filled 2015-06-02: qty 1

## 2015-06-02 MED ORDER — MORPHINE SULFATE 4 MG/ML IJ SOLN: 4.0000 mg | INTRAMUSCULAR | Status: DC | PRN

## 2015-06-02 MED ORDER — PROPOFOL 10 MG/ML IV BOLUS
INTRAVENOUS | Status: AC
Start: 2015-06-02 — End: 2015-06-02
  Filled 2015-06-02: qty 20

## 2015-06-02 MED ORDER — CEFAZOLIN SODIUM-DEXTROSE 2-3 GM-% IV SOLR
2.0000 g | INTRAVENOUS | Status: AC
  Administered 2015-06-02: 2 g via INTRAVENOUS
  Filled 2015-06-02: qty 50

## 2015-06-02 MED ORDER — NEOSTIGMINE METHYLSULFATE 10 MG/10ML IV SOLN
INTRAVENOUS | Status: DC | PRN
  Administered 2015-06-02: 4 mg via INTRAVENOUS

## 2015-06-02 MED ORDER — ONDANSETRON HCL 4 MG/2ML IJ SOLN
4.0000 mg | Freq: Four times a day (QID) | INTRAMUSCULAR | Status: DC | PRN
  Administered 2015-06-02 – 2015-06-03 (×2): 4 mg via INTRAVENOUS
  Filled 2015-06-02 (×2): qty 2

## 2015-06-02 MED ORDER — ALUM & MAG HYDROXIDE-SIMETH 200-200-20 MG/5ML PO SUSP: 30.0000 mL | Freq: Four times a day (QID) | ORAL | Status: DC | PRN

## 2015-06-02 MED ORDER — ARTIFICIAL TEARS OP OINT
TOPICAL_OINTMENT | OPHTHALMIC | Status: AC
  Filled 2015-06-02: qty 14

## 2015-06-02 MED ORDER — GLYCOPYRROLATE 0.2 MG/ML IJ SOLN
INTRAMUSCULAR | Status: DC | PRN
  Administered 2015-06-02: 0.6 mg via INTRAVENOUS

## 2015-06-02 MED ORDER — HYDROCODONE-ACETAMINOPHEN 5-325 MG PO TABS: 1.0000 | ORAL_TABLET | ORAL | Status: DC | PRN

## 2015-06-02 MED ORDER — OXYCODONE HCL 5 MG PO TABS: 5.0000 mg | ORAL_TABLET | Freq: Once | ORAL | Status: DC | PRN

## 2015-06-02 MED ORDER — DEXTROSE 5 % IV SOLN: INTRAVENOUS | Status: DC | PRN

## 2015-06-02 MED ORDER — KETOROLAC TROMETHAMINE 30 MG/ML IJ SOLN
INTRAMUSCULAR | Status: AC
  Administered 2015-06-02: 30 mg via INTRAVENOUS
  Filled 2015-06-02: qty 1

## 2015-06-02 MED ORDER — SUCCINYLCHOLINE CHLORIDE 20 MG/ML IJ SOLN
INTRAMUSCULAR | Status: AC
  Filled 2015-06-02: qty 1

## 2015-06-02 MED ORDER — MIDAZOLAM HCL 5 MG/5ML IJ SOLN
INTRAMUSCULAR | Status: DC | PRN
  Administered 2015-06-02 (×2): 2 mg via INTRAVENOUS

## 2015-06-02 SURGICAL SUPPLY — 72 items
ADH SKN CLS APL DERMABOND .7 (GAUZE/BANDAGES/DRESSINGS) ×1
BAG DECANTER FOR FLEXI CONT (MISCELLANEOUS) ×2 IMPLANT
BIT DRILL NEURO 2X3.1 SFT TUCH (MISCELLANEOUS) ×1 IMPLANT
BIT DRILL WIRE PASS 1.3MM (BIT) ×2 IMPLANT
BLADE CLIPPER SURG (BLADE) ×2 IMPLANT
BLOCKER OASYS (Neuro Prosthesis/Implant) ×6 IMPLANT
BRUSH SCRUB EZ PLAIN DRY (MISCELLANEOUS) ×2 IMPLANT
BUR ACRON 5.0MM COATED (BURR) ×1 IMPLANT
CANISTER SUCT 3000ML PPV (MISCELLANEOUS) ×2 IMPLANT
CONT SPEC 4OZ CLIKSEAL STRL BL (MISCELLANEOUS) ×2 IMPLANT
COVER BACK TABLE 60X90IN (DRAPES) ×2 IMPLANT
DECANTER SPIKE VIAL GLASS SM (MISCELLANEOUS) ×2 IMPLANT
DERMABOND ADVANCED (GAUZE/BANDAGES/DRESSINGS) ×1
DERMABOND ADVANCED .7 DNX12 (GAUZE/BANDAGES/DRESSINGS) ×1 IMPLANT
DRAPE C-ARM 42X72 X-RAY (DRAPES) ×4 IMPLANT
DRAPE LAPAROTOMY 100X72 PEDS (DRAPES) ×2 IMPLANT
DRAPE POUCH INSTRU U-SHP 10X18 (DRAPES) ×2 IMPLANT
DRAPE PROXIMA HALF (DRAPES) IMPLANT
DRILL NEURO 2X3.1 SOFT TOUCH (MISCELLANEOUS) ×2
DRILL OASYS 2.5MM (BIT) IMPLANT
DRILL WIRE PASS 1.3MM (BIT) ×4
DRIUS OASYS 2.5MM (BIT) ×2
DRSG ADAPTIC 3X8 NADH LF (GAUZE/BANDAGES/DRESSINGS) ×1 IMPLANT
DRSG EMULSION OIL 3X3 NADH (GAUZE/BANDAGES/DRESSINGS) IMPLANT
ELECT REM PT RETURN 9FT ADLT (ELECTROSURGICAL) ×2
ELECTRODE REM PT RTRN 9FT ADLT (ELECTROSURGICAL) ×1 IMPLANT
EVACUATOR 1/8 PVC DRAIN (DRAIN) IMPLANT
GAUZE SPONGE 4X4 12PLY STRL (GAUZE/BANDAGES/DRESSINGS) ×2 IMPLANT
GAUZE SPONGE 4X4 16PLY XRAY LF (GAUZE/BANDAGES/DRESSINGS) IMPLANT
GLOVE BIO SURGEON STRL SZ 6.5 (GLOVE) ×3 IMPLANT
GLOVE BIO SURGEON STRL SZ7.5 (GLOVE) ×3 IMPLANT
GLOVE BIOGEL PI IND STRL 6.5 (GLOVE) IMPLANT
GLOVE BIOGEL PI IND STRL 8 (GLOVE) ×1 IMPLANT
GLOVE BIOGEL PI INDICATOR 6.5 (GLOVE) ×2
GLOVE BIOGEL PI INDICATOR 8 (GLOVE) ×1
GLOVE ECLIPSE 7.5 STRL STRAW (GLOVE) ×2 IMPLANT
GOWN STRL REUS W/ TWL LRG LVL3 (GOWN DISPOSABLE) IMPLANT
GOWN STRL REUS W/ TWL XL LVL3 (GOWN DISPOSABLE) ×1 IMPLANT
GOWN STRL REUS W/TWL 2XL LVL3 (GOWN DISPOSABLE) IMPLANT
GOWN STRL REUS W/TWL LRG LVL3 (GOWN DISPOSABLE) ×10
GOWN STRL REUS W/TWL XL LVL3 (GOWN DISPOSABLE) ×4
HEMOSTAT POWDER KIT SURGIFOAM (HEMOSTASIS) ×2 IMPLANT
HEMOSTAT SURGICEL 2X14 (HEMOSTASIS) IMPLANT
KIT BASIN OR (CUSTOM PROCEDURE TRAY) ×2 IMPLANT
KIT ROOM TURNOVER OR (KITS) ×2 IMPLANT
NDL SPNL 18GX3.5 QUINCKE PK (NEEDLE) ×1 IMPLANT
NEEDLE SPNL 18GX3.5 QUINCKE PK (NEEDLE) ×2 IMPLANT
NEEDLE SPNL 22GX3.5 QUINCKE BK (NEEDLE) ×4 IMPLANT
NS IRRIG 1000ML POUR BTL (IV SOLUTION) ×2 IMPLANT
PACK LAMINECTOMY NEURO (CUSTOM PROCEDURE TRAY) ×2 IMPLANT
PAD ARMBOARD 7.5X6 YLW CONV (MISCELLANEOUS) ×6 IMPLANT
PIN MAYFIELD SKULL DISP (PIN) ×2 IMPLANT
ROD OASYS 3.5X50MM (Rod) ×1 IMPLANT
ROD OASYS 3.5X60MM (Rod) ×1 IMPLANT
SCREW BIASED ANGLE 3.5X20 (Screw) ×4 IMPLANT
SCREW BIASED ANGLE 3.5X24 (Screw) ×4 IMPLANT
SPONGE LAP 4X18 X RAY DECT (DISPOSABLE) IMPLANT
SPONGE SURGIFOAM ABS GEL 100 (HEMOSTASIS) ×2 IMPLANT
STAPLER SKIN PROX WIDE 3.9 (STAPLE) ×2 IMPLANT
STRIP BIOACTIVE VITOSS 25X100X (Neuro Prosthesis/Implant) ×2 IMPLANT
SUT ETHILON 3 0 FSL (SUTURE) IMPLANT
SUT VIC AB 0 CT1 18XCR BRD8 (SUTURE) ×1 IMPLANT
SUT VIC AB 0 CT1 8-18 (SUTURE) ×2
SUT VIC AB 2-0 CP2 18 (SUTURE) ×2 IMPLANT
SYR 20ML ECCENTRIC (SYRINGE) ×2 IMPLANT
TAP 3.5MM (TAP) ×2 IMPLANT
TAPE CLOTH SURG 4X10 WHT LF (GAUZE/BANDAGES/DRESSINGS) ×1 IMPLANT
TOWEL OR 17X24 6PK STRL BLUE (TOWEL DISPOSABLE) ×2 IMPLANT
TOWEL OR 17X26 10 PK STRL BLUE (TOWEL DISPOSABLE) ×2 IMPLANT
TRAY FOLEY CATH 16FRSI W/METER (SET/KITS/TRAYS/PACK) ×1 IMPLANT
UNDERPAD 30X30 INCONTINENT (UNDERPADS AND DIAPERS) ×2 IMPLANT
WATER STERILE IRR 1000ML POUR (IV SOLUTION) ×2 IMPLANT

## 2015-06-02 NOTE — H&P (Signed)
Subjective: Patient is a 69 y.o. male who is admitted for treatment of cervical thoracic stenosis with increased signal seen on MRI, consistent with myelopathy.  Patient's had a number of cervical and lumbar surgeries. He had cervical myelopathy with spastic quadriparesis treated with both anterior and posterior decompression at the C3-4 and C4-5 levels. Patient presented a couple months ago with complaints that his lower extremities have become increasingly "sluggish" over the past 6 months or so. The symptoms been progressively worsening. He has a sense of weakness in his lower extremities. He has had difficulty getting up from the floor to a standing position. He denies any numbness or paresthesias. He denies any neck or back pain. MRI revealed degenerative disc disease and spondylosis involving the C5-6, C6-7, C7-T1, and T1-2 levels with significant canal stenosis at the C7-T1 and T1-2 levels. MRI shows increased signal in the spinal cord the T1-2 level. Patient is admitted now for a C7-T2 cervical thoracic laminectomy and a C7-T2 posterior lateral cervical thoracic arthrodesis with posterior instrumentation and bone graft.    Patient Active Problem List   Diagnosis Date Noted  . OSA (obstructive sleep apnea) 12/05/2014  . Portal vein thrombosis 11/14/2012  . Pulmonary embolism 11/13/2012  . AL amyloidosis 11/04/2012  . Heparin induced thrombocytopenia (HIT) 11/04/2012  . Mesenteric venous thrombosis 10/31/2012  . DVT (deep venous thrombosis) 02/02/2012  . Multiple myeloma 10/01/2011  . Multiple myeloma 10/01/2011  . Neuropathy 10/01/2011  . Dyslipidemia    Past Medical History  Diagnosis Date  . Dyslipidemia   . H/O multiple myeloma   . Spastic quadriparesis   . DVT (deep venous thrombosis)     right  . Pneumonia     hx of with last time in Nov 2012  . Joint pain   . Sleep apnea     severe with AHI 44.4 events per hour - now on CPAP at 11cm H2O  . Prostate cancer     amyloidosis,  multiple myeloma   . Arthritis     lumbar DDD  . Peripheral vascular disease     DVT- 01/2012, follows by Dr. [name], lovenox maintained since Spring 2013 , pt. aware that last dose is sch. for 07/22/2012  . Blood dyscrasia     plasma cell dyscrasia with associated amyloidosis  . AL amyloidosis 11/04/2012  . Heparin induced thrombocytopenia (HIT) 11/04/2012  . Complication of anesthesia     Past Surgical History  Procedure Laterality Date  . Prostatectomy  around 2005  . Posterior laminectomy / decompression lumbar spine  around 2007    L4-5  . Carpal tunnel release  2009    left  . Back surgery    . Anterior cervical decomp/discectomy fusion  02/26/2012    Procedure: ANTERIOR CERVICAL DECOMPRESSION/DISCECTOMY FUSION 2 LEVELS;  Surgeon: Hosie Spangle, MD;  Location: [facility];  Service: Neurosurgery;  Laterality: N/A;  C3-4 C4-5 Anterior cervical decompression/diskectomy, fusion  . Tonsillectomy      as a child  . Hernia repair      around 1985  . Cardiac catheterization  2010  . Colonoscopy    . Lipoma excision  around 1985    removed from one of his shoulders  . Posterior cervical fusion/foraminotomy  04/06/2012    Procedure: POSTERIOR CERVICAL FUSION/FORAMINOTOMY LEVEL 3;  Surgeon: Hosie Spangle, MD;  Location: [facility];  Service: Neurosurgery;  Laterality: N/A;  C3 and C4 laminectomy with C23 C34 C45 posterior cervical arthrodesis with instrumentation  .  Ivt      IVC filter placed d/t blood clots, removed- July- 2013  . Limbal stem cell transplant      No prescriptions prior to admission   Allergies  Allergen Reactions  . Enoxaparin Other (See Comments)    Blood clots  . Heparin Other (See Comments)    Heparin induced thrombocytopenia (SRA +)    History  Substance Use Topics  . Smoking status: Former Smoker    Quit date: 07/21/1979  . Smokeless tobacco: Never Used     Comment: quit 25+yrs ago  . Alcohol Use: 0.6 oz/week    1 Glasses of wine per week      Comment: occasionally    Family History  Problem Relation Age of Onset  . Anesthesia problems Neg Hx   . Hypotension Neg Hx   . Malignant hyperthermia Neg Hx   . Pseudochol deficiency Neg Hx   . Pancreatic cancer Mother      Review of Systems A comprehensive review of systems was negative.  Objective: Vital signs in last 24 hours:    EXAM: Patient is a well-developed well-nourished white male in no acute distress. Lungs are clear to auscultation , the patient has symmetrical respiratory excursion. Heart has a regular rate and rhythm normal S1 and S2 no murmur.   Abdomen is soft nontender nondistended bowel sounds are present. Extremity examination shows no clubbing cyanosis or edema. Neurologic examination shows on motor exam 5/5 strength to the upper and lower extremities, except for the left deltoid, which is weak secondary to a left rotator cuff problem. 5/5 in the right deltoid, as well as in the biceps, triceps, intrinsics, and grip bilaterally, as well as the iliopsoas, quadriceps, dorsiflexor, EHL, and plantar flexor bilaterally. Sensation is intact to pinprick in the distal upper shoulders, but diminished pinprick in the distal lower extremities. He has significantly decreased sensation to vibration in the distal lower extremities, complete absence at the great toe bilaterally. Significantly limited at the lateral malleolus bilaterally. Present at the patella bilaterally. Deep tendon reflexes show the biceps are 2-3 bilaterally. The left brachial radialis is 2-3, the right brachial radialis is 2. Triceps are trace in the left and minimal on the right. Left quadriceps is 2. Right quadriceps is 2-3. Gastrocnemius are minimal bilaterally. Toes are downgoing bilaterally. He has a fairly normal gait and stance.  Data Review:CBC    Component Value Date/Time   WBC 6.1 05/22/2015 1002   WBC 5.2 04/29/2015 1124   RBC 3.97* 05/22/2015 1002   RBC 4.53 04/29/2015 1124   HGB 12.5*  05/22/2015 1002   HGB 14.1 04/29/2015 1124   HCT 37.9* 05/22/2015 1002   HCT 42.9 04/29/2015 1124   PLT 157 05/22/2015 1002   PLT 167 04/29/2015 1124   MCV 95.5 05/22/2015 1002   MCV 94.9 04/29/2015 1124   MCH 31.5 05/22/2015 1002   MCH 31.2 04/29/2015 1124   MCHC 33.0 05/22/2015 1002   MCHC 32.9 04/29/2015 1124   RDW 14.0 05/22/2015 1002   RDW 13.7 04/29/2015 1124   LYMPHSABS 1.5 04/29/2015 1124   LYMPHSABS 0.6* 11/06/2012 0600   MONOABS 1.1* 04/29/2015 1124   MONOABS 1.2* 11/06/2012 0600   EOSABS 0.1 04/29/2015 1124   EOSABS 0.4 11/06/2012 0600   BASOSABS 0.0 04/29/2015 1124   BASOSABS 0.0 11/06/2012 0600                          BMET  Component Value Date/Time   NA 138 05/22/2015 1002   NA 138 04/29/2015 1124   K 4.2 05/22/2015 1002   K 4.5 04/29/2015 1124   CL 104 05/22/2015 1002   CL 106 03/14/2013 1304   CO2 26 05/22/2015 1002   CO2 25 04/29/2015 1124   GLUCOSE 103* 05/22/2015 1002   GLUCOSE 96 04/29/2015 1124   GLUCOSE 94 03/14/2013 1304   BUN 14 05/22/2015 1002   BUN 21.8 04/29/2015 1124   CREATININE 1.31* 05/22/2015 1002   CREATININE 1.3 04/29/2015 1124   CALCIUM 8.9 05/22/2015 1002   CALCIUM 9.4 04/29/2015 1124   GFRNONAA 54* 05/22/2015 1002   GFRAA >60 05/22/2015 1002     Assessment/Plan: Patient with myelopathy with evidence of degeneration including spondylosis and degenerative disease, with resulting canal stenosis at the C7-T1 and T1-2 levels, who is admitted for C7-T2 cervical thoracic laminectomy and a C7-T2 posterior cervical thoracic arthrodesis with posterior instrumentation and bone graft.  I've discussed with the patient the nature of his condition, the nature the surgical procedure, the typical length of surgery, hospital stay, and overall recuperation. We discussed limitations postoperatively. I discussed risks of surgery including risks of infection, bleeding, possibly need for transfusion, the risk of nerve root dysfunction with pain,  weakness, numbness, or paresthesias, the risk of spinal cord dysfunction with paralysis of all 4 limbs and quadriplegia, and the risk of dural tear and CSF leakage and possible need for further surgery, the risk of failure of the arthrodesis and the possible need for further surgery, and the risk of anesthetic complications including myocardial infarction, stroke, pneumonia, and death. We also discussed the need for postoperative immobilization in a cervical collar. Understanding all this the patient does wish to proceed with surgery and is admitted for such.    Hosie Spangle, MD 06/02/2015 7:40 AM

## 2015-06-02 NOTE — Progress Notes (Signed)
eLink Physician-Brief Progress Note Patient Name: Brent Allison DOB: 1946-10-19 MRN: [id number]   Date of Service  06/02/2015  HPI/Events of Note  Pt appears stable.   eICU Interventions  Started famotidine for GI prophylaxis.      Intervention Category Evaluation Type: New Patient Evaluation  Laverle Hobby 06/02/2015, 8:49 PM

## 2015-06-02 NOTE — OR Nursing (Signed)
Dr. Tobias Alexander and Dr. Sherwood Gambler updated to EKG result, pertinent assessment, interventions, and responses. Pt continues to deny chest pain and new orders recvd.0

## 2015-06-02 NOTE — Transfer of Care (Signed)
Immediate Anesthesia Transfer of Care Note  Patient: Brent Allison  Procedure(s) Performed: Procedure(s) with comments: Cervical Seven, Thoracic One, Thoracic Two Laminectomies with Cervical Seven-Thoracic One, Thoracic One-Thoracic Two Posterior Cervico-Thoracic  Arthrodesis with Instrumentation (N/A) - C7, T1, T2 laminectomies with C7-T2 cerevico thoracic arthrodesis with instrumentation  Patient Location: PACU  Anesthesia Type:General  Level of Consciousness: awake  Airway & Oxygen Therapy: Patient Spontanous Breathing and Patient connected to nasal cannula oxygen  Post-op Assessment: Report given to RN, Post -op Vital signs reviewed and stable and Patient moving all extremities  Post vital signs: Reviewed and stable  Last Vitals:  Filed Vitals:   06/02/15 0909  BP: 110/64  Pulse: 57  Temp: 36.5 C  Resp: 18    Complications: No apparent anesthesia complications

## 2015-06-02 NOTE — Progress Notes (Signed)
Filed Vitals:   06/02/15 1745 06/02/15 1800 06/02/15 1815 06/02/15 1830  BP: 135/63 128/61 134/54   Pulse: 83 82 84 85  Temp:      TempSrc:      Resp: 10 15 20 16   Height:      Weight:      SpO2: 100% 100% 100% 100%    Patient just transferred from PACU to neurosurgery ICU for monitoring overnight. Had some bigeminy and trigeminy in the PACU, and anesthesia service favor monitoring overnight. Have requested cardiac enzymes, CMET, and CBC with differential.  Moving all 4 extremities well. Foley to straight drainage. Dressing dry and intact. Hemovac drain in place.  Plan: Continue to progress through postoperative recovery.  Hosie Spangle, MD 06/02/2015, 7:26 PM

## 2015-06-02 NOTE — Anesthesia Postprocedure Evaluation (Signed)
Anesthesia Post Note  Patient: Brent Allison  Procedure(s) Performed: Procedure(s) (LRB): Cervical Seven, Thoracic One, Thoracic Two Laminectomies with Cervical Seven-Thoracic One, Thoracic One-Thoracic Two Posterior Cervico-Thoracic  Arthrodesis with Instrumentation (N/A)  Anesthesia type: general  Patient location: PACU  Post pain: Pain level controlled  Post assessment: Patient's Cardiovascular Status Stable  Last Vitals:  Filed Vitals:   06/02/15 1830  BP:   Pulse: 85  Temp:   Resp: 16    Post vital signs: Reviewed and stable PVC noted, to be put on telemetry  Level of consciousness: sedated  Complications: No apparent anesthesia complications

## 2015-06-02 NOTE — Anesthesia Procedure Notes (Addendum)
Procedure Name: Intubation Date/Time: 06/02/2015 12:08 PM Performed by: Scheryl Darter Pre-anesthesia Checklist: Patient identified, Emergency Drugs available, Suction available, Patient being monitored and Timeout performed Patient Re-evaluated:Patient Re-evaluated prior to inductionOxygen Delivery Method: Circle system utilized Preoxygenation: Pre-oxygenation with 100% oxygen Intubation Type: IV induction Ventilation: Mask ventilation without difficulty Laryngoscope Size: Glidescope Grade View: Grade I Tube type: Oral Number of attempts: 1 Airway Equipment and Method: Stylet and Video-laryngoscopy Placement Confirmation: ETT inserted through vocal cords under direct vision,  positive ETCO2 and breath sounds checked- equal and bilateral Secured at: 23 cm Tube secured with: Tape Dental Injury: Teeth and Oropharynx as per pre-operative assessment and Injury to lip  Difficulty Due To: Difficulty was anticipated Comments: Upper lip pinched with glide scope/ointment applied

## 2015-06-02 NOTE — Anesthesia Preprocedure Evaluation (Addendum)
Anesthesia Evaluation  Patient identified by MRN, date of birth, ID band Patient awake    Reviewed: Allergy & Precautions, NPO status , Patient's Chart, lab work & pertinent test results  Airway Mallampati: IV  TM Distance: >3 FB Neck ROM: Full    Dental   Pulmonary sleep apnea and Continuous Positive Airway Pressure Ventilation , former smoker,  breath sounds clear to auscultation        Cardiovascular + Peripheral Vascular Disease Rhythm:Regular Rate:Normal     Neuro/Psych negative neurological ROS     GI/Hepatic negative GI ROS, Neg liver ROS,   Endo/Other  negative endocrine ROS  Renal/GU negative Renal ROS     Musculoskeletal  (+) Arthritis -,   Abdominal   Peds  Hematology  (+) Blood dyscrasia, , HIT Hx of multiple myeloma and AL amyloidosis Hx DVT   Anesthesia Other Findings   Reproductive/Obstetrics                           Anesthesia Physical Anesthesia Plan  ASA: III  Anesthesia Plan: General   Post-op Pain Management:    Induction: Intravenous  Airway Management Planned: Oral ETT and Video Laryngoscope Planned  Additional Equipment:   Intra-op Plan:   Post-operative Plan: Extubation in OR  Informed Consent: I have reviewed the patients History and Physical, chart, labs and discussed the procedure including the risks, benefits and alternatives for the proposed anesthesia with the patient or authorized representative who has indicated his/her understanding and acceptance.   Dental advisory given  Plan Discussed with: CRNA  Anesthesia Plan Comments:        Anesthesia Quick Evaluation

## 2015-06-02 NOTE — Op Note (Signed)
06/02/2015  4:52 PM  PATIENT:  Brent Allison  69 y.o. male  PRE-OPERATIVE DIAGNOSIS:  Cervicothoracic stensois, C7-T1 and T1-T2; cervicothoracic spondylosis; cervicothoracic myelopathy  POST-OPERATIVE DIAGNOSIS:  Cervicothoracic stensois, C7-T1 and T1-T2; cervicothoracic spondylosis; cervicothoracic myelopathy  PROCEDURE:  Procedure(s):  C7, T1, T2 cervicothoracic laminectomy, C7-T2 cervicothoracic posterior arthrodesis with Oasys posterior instrumentation (pedicle screws and rods), locally harvested morcellized autograft, and Vitoss BA  SURGEON:  Surgeon(s): Jovita Gamma, MD Newman Pies, MD Newman Pies, MD  ASSISTANTS: Newman Pies, M.D.  ANESTHESIA:   general  EBL:  Total I/O In: 2250 [I.V.:2000; IV Piggyback:250] Out: 1075 [Urine:375; Blood:700]  BLOOD ADMINISTERED:none  COUNT: Correct per nursing staff  DRAINS: Epidural medium Hemovac   DICTATION: Patient was brought to the operating room, placed under general endotracheal anesthesia. The radiolucent 3 pin Mayfield head holder was applied, the patient was turned to a prone position. The occiput, posterior neck, and upper back were prepped with Betadine soap and solution and draped in a sterile fashion. The midline was infiltrated local anesthetic with epinephrine. The C-arm fluoroscope was used to localize the C7-T2 level, and incision was made over this region. Dissection was carried down to subcutaneous tissue. Bipolar cautery and electrocautery used to maintain hemostasis. Dissection was carried down to the cervicothoracic fascia which was incised bilaterally, and the Harris spinal musculature was dissected from the spinous process and lamina in a subperiosteal fashion. We continued to use the C-arm fluoroscope and identified the spinous processes, lamina, and lateral masses of C7, T1, and T2. An inferior C7, a complete T1, and a superior T2 laminectomy was performed using double-action rongeurs, the  high-speed drill, and Kerrison punches with thin footplates. The ligamentum flavum at each level was found to be markedly thickened, and this was carefully removed decompressing the spinal canal and thecal sac. We identified the pedicles of T1 and T2, via the laminectomy as well as with the C-arm fluoroscope, and identified the pedicle entry points posteriorly. Pilot holes were made with the high-speed drill, and then each pedicle was drilled by hand. There examined with the ball probe, good bony surfaces were found, and the posterior cortex was tapped, and we placed 3.5 x 24 mm screws bilaterally at each level. Pedicle entry points at C7 were identified with the C-arm fluoroscope. Again pilot holes were made with the high-speed drill, and then each pedicle was drilled by hand. They were examined with the ball probe, good bony surfaces were found, and the posterior cortex was tapped, and we placed 3.5 x 20 mm screws bilaterally. The lamina and facet joint surfaces were decorticated with the high-speed drill. We then selected a 50 mm rod for the left side, and a 60 mm rod for the right side. Each rod was lordosed with a bender. There were placed in the screw heads and secured with locking caps. Once all 6 locking caps were placed, final tightening was performed against a counter torque. Locally harvested morcellized autograft as well as Vitoss BA was packed over the decorticated bony surfaces, spanning from C7-T2. Hemostasis established with the use of bipolar cautery and electrocautery, as well as Gelfoam with thrombin and Surgifoam. A medium Hemovac drain was placed in the epidural space and brought out through a separate stab incision. It was sutured to the skin with a 3-0 nylon suture. We proceeded with wound closure. Paraspinal muscles were approximated with interrupted undyed 0 Vicryl sutures, deep fascia closed with interrupted undyed 0 Vicryl sutures. Scarpa's fascia closed with interrupted inverted  2-0  undyed Vicryl sutures. The subcutaneous subcuticular closed interrupted inverted 2-0 undyed Vicryl sutures. Skin is approximate surgical staples. The wound was dressed with Adaptic and sterile gauze, as well as the drain exit site. Following surgery the patient was turned back to a supine position, the 3 pin Mayfield head holder was removed, and he is to be reversed and the anesthetic, extubated, and transferred to the recovery room for further care.  PLAN OF CARE: Admit to inpatient   PATIENT DISPOSITION:  PACU - hemodynamically stable.   Delay start of Pharmacological VTE agent (>24hrs) due to surgical blood loss or risk of bleeding:  yes

## 2015-06-03 ENCOUNTER — Encounter (HOSPITAL_COMMUNITY): Payer: Self-pay | Admitting: Neurosurgery

## 2015-06-03 LAB — TROPONIN I
Troponin I: <0.03 ng/mL (ref <0.031)
Troponin I: <0.03 ng/mL (ref <0.031)

## 2015-06-03 MED ORDER — KETOROLAC TROMETHAMINE 30 MG/ML IJ SOLN
15.0000 mg | Freq: Four times a day (QID) | INTRAMUSCULAR | Status: DC
  Administered 2015-06-03 – 2015-06-04 (×4): 15 mg via INTRAVENOUS
  Filled 2015-06-03: qty 1

## 2015-06-03 NOTE — Progress Notes (Signed)
Pt complaining of bilateral hand/finger numbness and tingling, as well as R forearm pain. Grips present bilaterally, strength 4/5 bilaterally, which is unchanged from earlier exam. Fine motor tested by touching each finger to thumb. Test appropriate and equal bilaterally. Neurosurgery MD notified. No new orders given. Will continue to monitor.

## 2015-06-03 NOTE — Progress Notes (Signed)
Utilization Review Completed.Brent Allison T7/10/2015  

## 2015-06-03 NOTE — Progress Notes (Signed)
Subjective: Patient resting in bed, comfortable. Has ambulated with a staff last night in the ICU. Dressing dry and intact, moderate drainage from Hemovac. Bigeminy and trigeminy have resolved, patient now having only occasional PVC. Labs last night looked good. Describing some numbness and discomfort in the medial aspect of the right forearm and hand into the right fifth digit consistent with some right C8 radicular irritation.  Objective: Vital signs in last 24 hours: Filed Vitals:   06/03/15 0400 06/03/15 0500 06/03/15 0600 06/03/15 0700  BP: 92/46 103/45 106/52 101/55  Pulse: 64 63 63 66  Temp: 97.4 F (36.3 C)     TempSrc: Axillary     Resp: 18 16 19 14   Height:      Weight:      SpO2: 100% 100% 100% 100%    Intake/Output from previous day: 07/11 0701 - 07/12 0700 In: 4217 [P.O.:120; I.V.:3797; IV Piggyback:300] Out: 1725 [Urine:800; Drains:225; Blood:700] Intake/Output this shift: Total I/O In: 3 [I.V.:3] Out: -   Physical Exam:  Strength is 5/5 in the intrinsics bilaterally.  CBC  Recent Labs  06/02/15 1938  WBC 10.6*  HGB 11.5*  HCT 34.7*  PLT 135*   BMET  Recent Labs  06/02/15 1938  NA 136  K 3.9  CL 105  CO2 21*  GLUCOSE 192*  BUN 23*  CREATININE 1.44*  CALCIUM 8.6*    Studies/Results: Dg Cervical Spine 1 View  06/02/2015   CLINICAL DATA:  Posterior cervical fusion C7-T2  EXAM: DG CERVICAL SPINE - 1 VIEW; DG C-ARM GT 120 MIN  COMPARISON:  None.  FINDINGS: A posterior lumbar fusion from C7 to T2 with pedicle screws and vertical fusion rods.  IMPRESSION: Intraoperative view as above.   Electronically Signed   By: Suzy Bouchard M.D.   On: 06/02/2015 16:43   Dg C-arm Gt 120 Min  06/02/2015   CLINICAL DATA:  Posterior cervical fusion C7-T2  EXAM: DG CERVICAL SPINE - 1 VIEW; DG C-ARM GT 120 MIN  COMPARISON:  None.  FINDINGS: A posterior lumbar fusion from C7 to T2 with pedicle screws and vertical fusion rods.  IMPRESSION: Intraoperative view as above.    Electronically Signed   By: Suzy Bouchard M.D.   On: 06/02/2015 16:43    Assessment/Plan: We'll transfer to the [facility] for further recovery. We'll decrease Toradol to 15 mg for each dose. Encouraged to ambulate several times each day.   Hosie Spangle, MD 06/03/2015, 8:41 AM

## 2015-06-04 MED ORDER — OXYCODONE-ACETAMINOPHEN 5-325 MG PO TABS: 1.0000 | ORAL_TABLET | ORAL | Status: DC | PRN

## 2015-06-04 MED ORDER — CYCLOBENZAPRINE HCL 10 MG PO TABS: 5.0000 mg | ORAL_TABLET | Freq: Three times a day (TID) | ORAL | Status: DC | PRN

## 2015-06-04 NOTE — Discharge Summary (Signed)
Physician Discharge Summary  Patient ID: Brent Allison MRN: [id number] DOB/AGE: Dec 16, 1945 69 y.o.  Admit date: 06/02/2015 Discharge date: 06/04/2015  Admission Diagnoses:  Cervicothoracic stensois, C7-T1 and T1-T2; cervicothoracic spondylosis; cervicothoracic myelopathy  Discharge Diagnoses:  Cervicothoracic stensois, C7-T1 and T1-T2; cervicothoracic spondylosis; cervicothoracic myelopathy Active Problems:   Thoracic stenosis   Discharged Condition: good  Hospital Course: Patient was admitted, underwent a C7, T1, T2 cervical thoracic laminectomy, C7-T2 cervical thoracic posterior arthrodesis with posterior instrumentation and bone graft. Postoperatively he had some bigeminy and trigeminy in the recovery room, and the anesthesiologist recommended monitoring overnight. The bigeminy and trigeminy resolved, and a series of 3 cardiac enzymes were negative. He was subsequent transferred to the spine Center unit and has made good progress. He is up and ambulating actively. His Hemovac drain was removed, and his incision is healing nicely. He and his wife have been given instructions regarding wound care and activities following discharge. He is to return in one week for staple removal.  Discharge Exam: Blood pressure 127/66, pulse 67, temperature 98.3 F (36.8 C), temperature source Oral, resp. rate 16, height 6\' 1"  (1.854 m), weight 88.9 kg (195 lb 15.8 oz), SpO2 100 %.  Disposition: 01-Home or Self Care     Medication List    TAKE these medications        cyclobenzaprine 10 MG tablet  Commonly known as:  FLEXERIL  Take 0.5-1 tablets (5-10 mg total) by mouth 3 (three) times daily as needed for muscle spasms.     oxyCODONE-acetaminophen 5-325 MG per tablet  Commonly known as:  PERCOCET/ROXICET  Take 1-2 tablets by mouth every 4 (four) hours as needed for moderate pain.           Follow-up Information    Follow up with Hosie Spangle, MD. Schedule an appointment as soon as  possible for a visit in 1 week.   Specialty:  Neurosurgery   Why:  For suture removal   Contact information:   [address] [place] [address] [phone]       Signed: Hosie Spangle 06/04/2015, 3:17 PM

## 2015-06-04 NOTE — Care Management Important Message (Signed)
Important Message  Patient Details  Name: Brent Allison MRN: [id number] Date of Birth: 1946-04-01   Medicare Important Message Given:  Yes-second notification given    Nathen May 06/04/2015, 2:29 [place] Message  Patient Details  Name: Brent Allison MRN: [id number] Date of Birth: [birth year]   Medicare Important Message Given:  Yes-second notification given    Nathen May 06/04/2015, 2:29 PM

## 2015-06-04 NOTE — Progress Notes (Signed)
Pt doing well. Pt and wife given D/C instructions with Rx's, verbal understanding was provided. Pt's IV was removed prior to D/C. Pt's incision is clean and dry with no sign of infection. Pt D/C'd home via wheelchair @ 1530 per MD order. Pt is stable @ D/C and has no other needs at this time. Holli Humbles, RN

## 2015-06-11 DIAGNOSIS — Z6825 Body mass index (BMI) 25.0-25.9, adult: Secondary | ICD-10-CM | POA: Diagnosis not present

## 2015-06-11 DIAGNOSIS — M4714 Other spondylosis with myelopathy, thoracic region: Secondary | ICD-10-CM | POA: Diagnosis not present

## 2015-06-11 DIAGNOSIS — M4802 Spinal stenosis, cervical region: Secondary | ICD-10-CM | POA: Diagnosis not present

## 2015-06-11 DIAGNOSIS — M4324 Fusion of spine, thoracic region: Secondary | ICD-10-CM | POA: Diagnosis not present

## 2015-06-11 DIAGNOSIS — M5134 Other intervertebral disc degeneration, thoracic region: Secondary | ICD-10-CM | POA: Diagnosis not present

## 2015-07-02 DIAGNOSIS — H52203 Unspecified astigmatism, bilateral: Secondary | ICD-10-CM | POA: Diagnosis not present

## 2015-07-02 DIAGNOSIS — H2513 Age-related nuclear cataract, bilateral: Secondary | ICD-10-CM | POA: Diagnosis not present

## 2015-08-04 ENCOUNTER — Other Ambulatory Visit (HOSPITAL_BASED_OUTPATIENT_CLINIC_OR_DEPARTMENT_OTHER): Payer: Medicare Other

## 2015-08-04 DIAGNOSIS — E858 Other amyloidosis: Secondary | ICD-10-CM

## 2015-08-04 DIAGNOSIS — C9000 Multiple myeloma not having achieved remission: Secondary | ICD-10-CM

## 2015-08-04 DIAGNOSIS — E851 Neuropathic heredofamilial amyloidosis: Secondary | ICD-10-CM | POA: Diagnosis not present

## 2015-08-04 DIAGNOSIS — E8581 Light chain (AL) amyloidosis: Secondary | ICD-10-CM

## 2015-08-04 LAB — COMPREHENSIVE METABOLIC PANEL (CC13)
ALT: 7 U/L (ref 0–55)
AST: 10 U/L (ref 5–34)
Albumin: 3.4 g/dL — ABNORMAL LOW (ref 3.5–5.0)
Alkaline Phosphatase: 99 U/L (ref 40–150)
Anion Gap: 7 mEq/L (ref 3–11)
BUN: 21.3 mg/dL (ref 7.0–26.0)
CO2: 26 mEq/L (ref 22–29)
Calcium: 9.4 mg/dL (ref 8.4–10.4)
Chloride: 109 mEq/L (ref 98–109)
Creatinine: 1.2 mg/dL (ref 0.7–1.3)
EGFR: 58 mL/min/{1.73_m2} — ABNORMAL LOW (ref >90)
Glucose: 83 mg/dl (ref 70–140)
Potassium: 4.1 mEq/L (ref 3.5–5.1)
Sodium: 142 mEq/L (ref 136–145)
Total Bilirubin: 0.28 mg/dL (ref 0.20–1.20)
Total Protein: 6.4 g/dL (ref 6.4–8.3)

## 2015-08-04 LAB — CBC WITH DIFFERENTIAL/PLATELET
BASO%: 1 % (ref 0.0–2.0)
Basophils Absolute: 0.1 10*3/uL (ref 0.0–0.1)
EOS%: 4.9 % (ref 0.0–7.0)
Eosinophils Absolute: 0.3 10*3/uL (ref 0.0–0.5)
HCT: 38.6 % (ref 38.4–49.9)
HGB: 12.9 g/dL — ABNORMAL LOW (ref 13.0–17.1)
LYMPH%: 19.9 % (ref 14.0–49.0)
MCH: 31.2 pg (ref 27.2–33.4)
MCHC: 33.4 g/dL (ref 32.0–36.0)
MCV: 93.5 fL (ref 79.3–98.0)
MONO#: 0.7 10*3/uL (ref 0.1–0.9)
MONO%: 10.7 % (ref 0.0–14.0)
NEUT#: 4 10*3/uL (ref 1.5–6.5)
NEUT%: 63.5 % (ref 39.0–75.0)
Platelets: 170 10*3/uL (ref 140–400)
RBC: 4.13 10*6/uL — ABNORMAL LOW (ref 4.20–5.82)
RDW: 14.5 % (ref 11.0–14.6)
WBC: 6.4 10*3/uL (ref 4.0–10.3)
lymph#: 1.3 10*3/uL (ref 0.9–3.3)

## 2015-08-05 LAB — KAPPA/LAMBDA LIGHT CHAINS
Kappa free light chain: 2.51 mg/dL — ABNORMAL HIGH (ref 0.33–1.94)
Kappa:Lambda Ratio: 0.53 (ref 0.26–1.65)
Lambda Free Lght Chn: 4.71 mg/dL — ABNORMAL HIGH (ref 0.57–2.63)

## 2015-08-05 LAB — IGG, IGA, IGM
IgA: 201 mg/dL (ref 68–379)
IgG (Immunoglobin G), Serum: 926 mg/dL (ref 650–1600)
IgM, Serum: 111 mg/dL (ref 41–251)

## 2015-08-07 ENCOUNTER — Other Ambulatory Visit: Payer: Medicare Other

## 2015-08-11 ENCOUNTER — Telehealth: Payer: Self-pay | Admitting: Oncology

## 2015-08-11 ENCOUNTER — Ambulatory Visit (HOSPITAL_BASED_OUTPATIENT_CLINIC_OR_DEPARTMENT_OTHER): Payer: Medicare Other | Admitting: Oncology

## 2015-08-11 VITALS — BP 114/61 | HR 67 | Temp 98.4°F | Resp 18 | Ht 73.0 in | Wt 194.7 lb

## 2015-08-11 DIAGNOSIS — E858 Other amyloidosis: Secondary | ICD-10-CM

## 2015-08-11 DIAGNOSIS — Z23 Encounter for immunization: Secondary | ICD-10-CM

## 2015-08-11 DIAGNOSIS — C9000 Multiple myeloma not having achieved remission: Secondary | ICD-10-CM

## 2015-08-11 MED ORDER — INFLUENZA VAC SPLIT QUAD 0.5 ML IM SUSY
0.5000 mL | PREFILLED_SYRINGE | Freq: Once | INTRAMUSCULAR | Status: AC
  Administered 2015-08-11: 0.5 mL via INTRAMUSCULAR
  Filled 2015-08-11: qty 0.5

## 2015-08-11 NOTE — Telephone Encounter (Signed)
Pt confirmed labs/ov per 09/19 POF, gave pt AVS and Calendar... KJ °

## 2015-08-11 NOTE — Progress Notes (Signed)
[facility] OFFICE PROGRESS NOTE   Diagnosis:  amyloidosis  INTERVAL HISTORY:    Brent Allison underwent a cervical thoracic laminectomy with a C7-T2 cervical thoracic posterior arthrodesis and bone graft on 06/02/2015. He has recovered from surgery. No significant leg weakness at present.   he feels well. He has noted  Altered smell and taste for approximate the past one month. No new bruising.  Objective:  Vital signs in last 24 hours:  Blood pressure 114/61, pulse 67, temperature 98.4 F (36.9 C), temperature source Oral, resp. rate 18, height 6' 1"  (1.854 m), weight 194 lb 11.2 oz (88.315 kg), SpO2 100 %.    HEENT:  Macroglossia , oral cavity without bleeding or bruising Resp:  Lungs clear bilaterally Cardio:  Regular rate and rhythm GI:  No hepatosplenomegaly, nontender Vascular:  Trace edema at the right lower leg Neuro: the leg and arm strength appears intact bilaterally  Skin: small periorbital ecchymoses     Lab Results:  Lab Results  Component Value Date   WBC 6.4 08/04/2015   HGB 12.9* 08/04/2015   HCT 38.6 08/04/2015   MCV 93.5 08/04/2015   PLT 170 08/04/2015   NEUTROABS 4.0 08/04/2015   Creatinine 1.2, potassium 4.1, calcium 9.4 IgG 926, IgA 201, IgM 111, kappa free light chain 2.51, lambda free light chain 4.71  Medications: I have reviewed the patient's current medications.  Assessment/Plan: 1. 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 2. "Bruising" at the eyelids and mouth: Likely related to amyloidosis, persistent. 3. Numbness and loss of vibratory sense at the fingertips: This predated Velcade-based therapy, but worsened. Now much improved following cervical spine surgery. 4. Elevated serum free lambda light chains. The lambda light chains were lower on November 16 and slightly higher on 12/14/2011. 5. Lambda light chain proteinuria. 6. Bone marrow plasmacytosis: Variable increase in plasma cells noted on the bone marrow biopsy  07/29/2011 with plasma cells estimated to represent between 4% and 20% of the cellular population. 7. Remote history of prostate cancer. 8. Sleep apnea. 9. Dyslipidemia. 10. Report of pneumonia on 2 occasions in 2011. 11. Admission to a hospital in [place], [place] October 2012 with "pneumonia." A chest x-ray at [facility] on November 2 was negative .  12. Low serum immunoglobulin G level. 13. Plasma cell dyscrasia with associated amyloidosis.  1. Initiation of systemic therapy with Cytoxan, Velcade, and Decadron 08/12/2011. Cycle #2 was initiated on 09/16/2011. Cycle #3 was initiated on 10/15/2011. Velcade was placed on hold due to neuropathy. 2. The serum free lambda light chains were decreased on October 08 2011. 3. The serum free lambda light chains were slightly increased on 12/14/2011, lower on 12/29/2011 and 02/02/2012. 4. Initiation of Revlimid/Decadron February 2013, cycle 2 started on 01/28/2012. 5. High-dose melphalan chemotherapy followed by autologous stem cell infusion on 02/09/2013 6. Initiation of Velcade/Decadrontherapy 05/28/2013, he completed 2 cycles on a day 1, 4, 8, 11 schedule 7. Improvement in the serum free lambda light chains following induction Velcade/Decadron  8. Initiation of weekly Velcade/Decadron on 07/16/2013, last treatment on 10/16/2013 9. Improvement in the serum free lambda light chains on 08/21/2013 and 09/18/2013 ([facility]) 10. Normal serum free lambda light chains on 12/13/2013 11. Mild increase of the serum free lambda light chains beginning January 2015-stable 15. Loss of "balance "and proximal motor weakness secondary to cervical stenosis-status post decompression surgery on 02/19/2012. Improved, but not resolved stenosis with mass effect on the spinal cord noted on a repeat MRI 03/28/2012. He underwent further decompression surgery  on 04/06/2012. The ataxia, weakness, and peripheral numbness is much improved.  16. Right lower Extremity deep vein  thrombosis 02/02/2012-a Doppler ultrasound confirmed a gastrocnemius and peroneal deep vein thrombosis. An IVC filter was placed prior to surgery and he was maintained on Lovenox. The IVC filter was removed on 05/04/2012.  17. Anorexia following cervical spine surgery. Improved.  18. Episode of flank pain and hematuria in 03/31/2012-etiology unclear. He completed a course of antibiotics. The hematuria and pain have resolved.  19. Rectal wall thickening noted on a CT of the pelvis 03/31/2012.  20. colonoscopy showed an area of abnormality at the rectum with biopsy positive for amyloid.  21. Lung nodule noted on the CT 03/31/2012 , no lung nodule on a chest CT 07/30/2013  22. Lumbar stenosis-status post surgery on 07/31/2012.  23. Status post stem cell collection.  24.Rright internal jugular thrombosis, 10/22/2012, likely related to pheresis catheter and HIT  25. Admission with acute right pulmonary embolism 10/27/2012  26. Superior mesenteric vein/portal vein thrombosis diagnosed on a CT of the abdomen 10/28/2012 and 10/29/2012-treated with Argatroban , maintained on Coumadin anticoagulation until he hospital admission March 2014 for stem cell therapy  27. HIT confirmed on a serotonin release assay 10/29/2012  28. History of Renal insufficiency during the December 2013 hospital admission-likely related to dehydration and? Contrast nephropathy , persistent mild elevation of the creatinine  29. Dysphagia-? Secondary to amyloidosis, reported when he was here on 05/08/2013. He did not complain of dysphagia today  30. Anemia secondary to multiple myeloma and chemotherapy , improved  31.right lower Extremity deep vein thrombosis diagnosed at [facility] July 2014 , repeat Doppler at [facility] 07/19/2013 with chronic thrombus in the right gastrocnemius, Eliquis has been discontinued  32. Fall August 2014 with a left shoulder injury, diagnosed with a rotator cuff tear, status post surgical repair 09/19/2013   33. 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 The neuropathy symptoms have partially improved since Velcade was discontinued after treatment 10/16/2013  34. Cardiac MRI 05/22/2015-normal left ventricular size with severe concentric hypertrophy and mildly impaired systolic function, [id number]-49%, right ventricular hypertrophy, diffuse late gadolinium enhancement in the left and right ventricles-findings consistent with cardiac amyloidosis     Disposition:  He appears stable. No clinical evidence for progression of the amyloidosis. He has been maintained off of specific therapy since November 2014.  I will refer him to Dr. Melba Coon to discuss the indication for resuming systemic therapy in the setting of cardiac amyloidosis. Echocardiograms dating to 2014 have revealed left ventricular hypertrophy.  He received an influenza vaccine today. He will return for an office and lab visit in 3 months. Betsy Coder, MD  08/11/2015  5:42 PM

## 2015-08-15 DIAGNOSIS — Z6826 Body mass index (BMI) 26.0-26.9, adult: Secondary | ICD-10-CM | POA: Diagnosis not present

## 2015-08-15 DIAGNOSIS — M4324 Fusion of spine, thoracic region: Secondary | ICD-10-CM | POA: Diagnosis not present

## 2015-08-15 DIAGNOSIS — M5134 Other intervertebral disc degeneration, thoracic region: Secondary | ICD-10-CM | POA: Diagnosis not present

## 2015-08-15 DIAGNOSIS — M4714 Other spondylosis with myelopathy, thoracic region: Secondary | ICD-10-CM | POA: Diagnosis not present

## 2015-09-17 DIAGNOSIS — R0609 Other forms of dyspnea: Secondary | ICD-10-CM | POA: Diagnosis not present

## 2015-09-17 DIAGNOSIS — I5042 Chronic combined systolic (congestive) and diastolic (congestive) heart failure: Secondary | ICD-10-CM | POA: Diagnosis not present

## 2015-09-17 DIAGNOSIS — E858 Other amyloidosis: Secondary | ICD-10-CM | POA: Diagnosis not present

## 2015-09-17 DIAGNOSIS — C9000 Multiple myeloma not having achieved remission: Secondary | ICD-10-CM | POA: Diagnosis not present

## 2015-10-01 DIAGNOSIS — Z85828 Personal history of other malignant neoplasm of skin: Secondary | ICD-10-CM | POA: Diagnosis not present

## 2015-10-01 DIAGNOSIS — D1801 Hemangioma of skin and subcutaneous tissue: Secondary | ICD-10-CM | POA: Diagnosis not present

## 2015-10-01 DIAGNOSIS — L570 Actinic keratosis: Secondary | ICD-10-CM | POA: Diagnosis not present

## 2015-10-01 DIAGNOSIS — D692 Other nonthrombocytopenic purpura: Secondary | ICD-10-CM | POA: Diagnosis not present

## 2015-10-01 DIAGNOSIS — H61001 Unspecified perichondritis of right external ear: Secondary | ICD-10-CM | POA: Diagnosis not present

## 2015-10-01 DIAGNOSIS — L814 Other melanin hyperpigmentation: Secondary | ICD-10-CM | POA: Diagnosis not present

## 2015-10-01 DIAGNOSIS — D225 Melanocytic nevi of trunk: Secondary | ICD-10-CM | POA: Diagnosis not present

## 2015-10-21 DIAGNOSIS — E854 Organ-limited amyloidosis: Secondary | ICD-10-CM | POA: Diagnosis not present

## 2015-10-21 DIAGNOSIS — R0609 Other forms of dyspnea: Secondary | ICD-10-CM | POA: Diagnosis not present

## 2015-10-21 DIAGNOSIS — C9000 Multiple myeloma not having achieved remission: Secondary | ICD-10-CM | POA: Diagnosis not present

## 2015-10-21 DIAGNOSIS — R5383 Other fatigue: Secondary | ICD-10-CM | POA: Diagnosis not present

## 2015-10-21 DIAGNOSIS — Z87891 Personal history of nicotine dependence: Secondary | ICD-10-CM | POA: Diagnosis not present

## 2015-10-21 DIAGNOSIS — Z9484 Stem cells transplant status: Secondary | ICD-10-CM | POA: Diagnosis not present

## 2015-10-21 DIAGNOSIS — I43 Cardiomyopathy in diseases classified elsewhere: Secondary | ICD-10-CM | POA: Diagnosis not present

## 2015-10-21 DIAGNOSIS — Z862 Personal history of diseases of the blood and blood-forming organs and certain disorders involving the immune mechanism: Secondary | ICD-10-CM | POA: Diagnosis not present

## 2015-10-21 DIAGNOSIS — Z86718 Personal history of other venous thrombosis and embolism: Secondary | ICD-10-CM | POA: Diagnosis not present

## 2015-10-31 ENCOUNTER — Other Ambulatory Visit (HOSPITAL_BASED_OUTPATIENT_CLINIC_OR_DEPARTMENT_OTHER): Payer: Medicare Other

## 2015-10-31 DIAGNOSIS — C9000 Multiple myeloma not having achieved remission: Secondary | ICD-10-CM

## 2015-10-31 LAB — COMPREHENSIVE METABOLIC PANEL
ALT: 9 U/L (ref 0–55)
AST: 16 U/L (ref 5–34)
Albumin: 3.7 g/dL (ref 3.5–5.0)
Alkaline Phosphatase: 97 U/L (ref 40–150)
Anion Gap: 10 mEq/L (ref 3–11)
BUN: 22.6 mg/dL (ref 7.0–26.0)
CO2: 23 mEq/L (ref 22–29)
Calcium: 9.6 mg/dL (ref 8.4–10.4)
Chloride: 107 mEq/L (ref 98–109)
Creatinine: 1.3 mg/dL (ref 0.7–1.3)
EGFR: 57 mL/min/{1.73_m2} — ABNORMAL LOW (ref >90)
Glucose: 95 mg/dl (ref 70–140)
Potassium: 4.3 mEq/L (ref 3.5–5.1)
Sodium: 140 mEq/L (ref 136–145)
Total Bilirubin: 0.55 mg/dL (ref 0.20–1.20)
Total Protein: 6.9 g/dL (ref 6.4–8.3)

## 2015-10-31 LAB — CBC WITH DIFFERENTIAL/PLATELET
BASO%: 0.7 % (ref 0.0–2.0)
Basophils Absolute: 0 10*3/uL (ref 0.0–0.1)
EOS%: 2 % (ref 0.0–7.0)
Eosinophils Absolute: 0.1 10*3/uL (ref 0.0–0.5)
HCT: 42.5 % (ref 38.4–49.9)
HGB: 14.1 g/dL (ref 13.0–17.1)
LYMPH%: 28.2 % (ref 14.0–49.0)
MCH: 30.9 pg (ref 27.2–33.4)
MCHC: 33.2 g/dL (ref 32.0–36.0)
MCV: 93 fL (ref 79.3–98.0)
MONO#: 0.7 10*3/uL (ref 0.1–0.9)
MONO%: 12.2 % (ref 0.0–14.0)
NEUT#: 3.4 10*3/uL (ref 1.5–6.5)
NEUT%: 56.9 % (ref 39.0–75.0)
Platelets: 155 10*3/uL (ref 140–400)
RBC: 4.57 10*6/uL (ref 4.20–5.82)
RDW: 15 % — ABNORMAL HIGH (ref 11.0–14.6)
WBC: 5.9 10*3/uL (ref 4.0–10.3)
lymph#: 1.7 10*3/uL (ref 0.9–3.3)

## 2015-11-03 LAB — IGG, IGA, IGM
IgA: 210 mg/dL (ref 68–379)
IgG (Immunoglobin G), Serum: 927 mg/dL (ref 650–1600)
IgM, Serum: 132 mg/dL (ref 41–251)

## 2015-11-03 LAB — KAPPA/LAMBDA LIGHT CHAINS
Kappa free light chain: 2.14 mg/dL — ABNORMAL HIGH (ref 0.33–1.94)
Kappa:Lambda Ratio: 0.46 (ref 0.26–1.65)
Lambda Free Lght Chn: 4.69 mg/dL — ABNORMAL HIGH (ref 0.57–2.63)

## 2015-11-06 ENCOUNTER — Telehealth: Payer: Self-pay | Admitting: Oncology

## 2015-11-06 ENCOUNTER — Ambulatory Visit (HOSPITAL_BASED_OUTPATIENT_CLINIC_OR_DEPARTMENT_OTHER): Payer: Medicare Other | Admitting: Oncology

## 2015-11-06 VITALS — BP 128/68 | HR 67 | Temp 98.0°F | Resp 18 | Ht 73.0 in | Wt 206.1 lb

## 2015-11-06 DIAGNOSIS — C9001 Multiple myeloma in remission: Secondary | ICD-10-CM | POA: Diagnosis not present

## 2015-11-06 NOTE — Telephone Encounter (Signed)
Gave and printed appt sched and avs for pt for March. °

## 2015-11-06 NOTE — Progress Notes (Signed)
[facility] OFFICE PROGRESS NOTE   Diagnosis: Amyloidosis  INTERVAL HISTORY:   Brent Allison returns as scheduled. He feels well. No new complaint. He is now being followed by Dr. Amalia Hailey at [facility]. He reports he is being considered for a clinical trial with Dr. Tracey Harries at [facility].   Objective:  Vital signs in last 24 hours:  Blood pressure 128/68, pulse 67, temperature 98 F (36.7 C), temperature source Oral, resp. rate 18, height 6' 1"  (1.854 m), weight 206 lb 1.6 oz (93.486 kg), SpO2 100 %.    HEENT:  macroglossia, no oral ecchymoses Resp:  lungs clear bilaterally Cardio:  regular rate and rhythm GI:  no hepatosplenomegaly Vascular:  trace edema at the right lower leg with support stockings in place  Skin: Skin thickening at the fingers     Lab Results:  Lab Results  Component Value Date   WBC 5.9 10/31/2015   HGB 14.1 10/31/2015   HCT 42.5 10/31/2015   MCV 93.0 10/31/2015   PLT 155 10/31/2015   NEUTROABS 3.4 10/31/2015   BUN 22.6, creatinine 1.3, potassium 4.3, calcium 9.6, albumin 3.7  IgG 927   IgA to 10 IgM 132  Free light chains 2.14 Lambda free light chains 4.69  Medications: I have reviewed the patient's current medications.  Assessment/Plan: 1. 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 2. "Bruising" at the eyelids and mouth: Likely related to amyloidosis, persistent. 3. Numbness and loss of vibratory sense at the fingertips: This predated Velcade-based therapy, but worsened. Now much improved following cervical spine surgery. 4. Elevated serum free lambda light chains. The lambda light chains were lower on November 16 and slightly higher on 12/14/2011. 5. Lambda light chain proteinuria. 6. Bone marrow plasmacytosis: Variable increase in plasma cells noted on the bone marrow biopsy 07/29/2011 with plasma cells estimated to represent between 4% and 20% of the cellular population. 7. Remote history of prostate cancer. 8. Sleep  apnea. 9. Dyslipidemia. 10. Report of pneumonia on 2 occasions in 2011. 11. Admission to a hospital in [place], [place] October 2012 with "pneumonia." A chest x-ray at [facility] on November 2 was negative .  12. Low serum immunoglobulin G level. 13. Plasma cell dyscrasia with associated amyloidosis.  1. Initiation of systemic therapy with Cytoxan, Velcade, and Decadron 08/12/2011. Cycle #2 was initiated on 09/16/2011. Cycle #3 was initiated on 10/15/2011. Velcade was placed on hold due to neuropathy. 2. The serum free lambda light chains were decreased on October 08 2011. 3. The serum free lambda light chains were slightly increased on 12/14/2011, lower on 12/29/2011 and 02/02/2012. 4. Initiation of Revlimid/Decadron February 2013, cycle 2 started on 01/28/2012. 5. High-dose melphalan chemotherapy followed by autologous stem cell infusion on 02/09/2013 6. Initiation of Velcade/Decadrontherapy 05/28/2013, he completed 2 cycles on a day 1, 4, 8, 11 schedule 7. Improvement in the serum free lambda light chains following induction Velcade/Decadron  8. Initiation of weekly Velcade/Decadron on 07/16/2013, last treatment on 10/16/2013 9. Improvement in the serum free lambda light chains on 08/21/2013 and 09/18/2013 ([facility]) 10. Normal serum free lambda light chains on 12/13/2013 11. Mild increase of the serum free lambda light chains beginning January 2015-stable 15. Loss of "balance "and proximal motor weakness secondary to cervical stenosis-status post decompression surgery on 02/19/2012. Improved, but not resolved stenosis with mass effect on the spinal cord noted on a repeat MRI 03/28/2012. He underwent further decompression surgery on 04/06/2012. The ataxia, weakness, and peripheral numbness is much improved.  16. Right lower Extremity deep  vein thrombosis 02/02/2012-a Doppler ultrasound confirmed a gastrocnemius and peroneal deep vein thrombosis. An IVC filter was placed prior to surgery  and he was maintained on Lovenox. The IVC filter was removed on 05/04/2012.  17. Anorexia following cervical spine surgery. Improved.  18. Episode of flank pain and hematuria in 03/31/2012-etiology unclear. He completed a course of antibiotics. The hematuria and pain have resolved.  19. Rectal wall thickening noted on a CT of the pelvis 03/31/2012.  20. colonoscopy showed an area of abnormality at the rectum with biopsy positive for amyloid.  21. Lung nodule noted on the CT 03/31/2012 , no lung nodule on a chest CT 07/30/2013  22. Lumbar stenosis-status post surgery on 07/31/2012.  23. Status post stem cell collection.  24.Rright internal jugular thrombosis, 10/22/2012, likely related to pheresis catheter and HIT  25. Admission with acute right pulmonary embolism 10/27/2012  26. Superior mesenteric vein/portal vein thrombosis diagnosed on a CT of the abdomen 10/28/2012 and 10/29/2012-treated with Argatroban , maintained on Coumadin anticoagulation until he hospital admission March 2014 for stem cell therapy  27. HIT confirmed on a serotonin release assay 10/29/2012  28. History of Renal insufficiency during the December 2013 hospital admission-likely related to dehydration and? Contrast nephropathy , persistent mild elevation of the creatinine  29. Dysphagia-? Secondary to amyloidosis, reported when he was here on 05/08/2013. He did not complain of dysphagia today  30. Anemia secondary to multiple myeloma and chemotherapy , improved  31.right lower Extremity deep vein thrombosis diagnosed at [facility] July 2014 , repeat Doppler at [facility] 07/19/2013 with chronic thrombus in the right gastrocnemius, Eliquis has been discontinued  32. Fall August 2014 with a left shoulder injury, diagnosed with a rotator cuff tear, status post surgical repair 09/19/2013  33. 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 The neuropathy symptoms have partially improved since Velcade was  discontinued after treatment 10/16/2013  34. Cardiac MRI 05/22/2015-normal left ventricular size with severe concentric hypertrophy and mildly impaired systolic function, [id number]-74%, right ventricular hypertrophy, diffuse late gadolinium enhancement in the left and right ventricles-findings consistent with cardiac amyloidosis    Disposition:   Brent Allison appears stable. There is no clinical or laboratory evidence of progressive myeloma/amyloidosis. He will return for an office and lab visit in 3 months.      Julieanne Manson, M.D.  11/06/2015  5:57 PM

## 2015-11-06 NOTE — Telephone Encounter (Signed)
Gave and printed appt sched and avs fo rpt for march °

## 2015-11-26 DIAGNOSIS — I43 Cardiomyopathy in diseases classified elsewhere: Secondary | ICD-10-CM | POA: Diagnosis not present

## 2015-11-26 DIAGNOSIS — I5189 Other ill-defined heart diseases: Secondary | ICD-10-CM | POA: Diagnosis not present

## 2015-11-26 DIAGNOSIS — Z006 Encounter for examination for normal comparison and control in clinical research program: Secondary | ICD-10-CM | POA: Diagnosis not present

## 2015-11-26 DIAGNOSIS — I517 Cardiomegaly: Secondary | ICD-10-CM | POA: Diagnosis not present

## 2015-11-26 DIAGNOSIS — I371 Nonrheumatic pulmonary valve insufficiency: Secondary | ICD-10-CM | POA: Diagnosis not present

## 2015-11-26 DIAGNOSIS — I340 Nonrheumatic mitral (valve) insufficiency: Secondary | ICD-10-CM | POA: Diagnosis not present

## 2015-11-26 DIAGNOSIS — E854 Organ-limited amyloidosis: Secondary | ICD-10-CM | POA: Diagnosis not present

## 2015-11-26 DIAGNOSIS — E858 Other amyloidosis: Secondary | ICD-10-CM | POA: Diagnosis not present

## 2015-12-17 DIAGNOSIS — Z86718 Personal history of other venous thrombosis and embolism: Secondary | ICD-10-CM | POA: Diagnosis not present

## 2015-12-17 DIAGNOSIS — E858 Other amyloidosis: Secondary | ICD-10-CM | POA: Diagnosis not present

## 2015-12-17 DIAGNOSIS — Z006 Encounter for examination for normal comparison and control in clinical research program: Secondary | ICD-10-CM | POA: Diagnosis not present

## 2015-12-17 DIAGNOSIS — I43 Cardiomyopathy in diseases classified elsewhere: Secondary | ICD-10-CM | POA: Diagnosis not present

## 2015-12-17 DIAGNOSIS — E854 Organ-limited amyloidosis: Secondary | ICD-10-CM | POA: Diagnosis not present

## 2015-12-29 DIAGNOSIS — N529 Male erectile dysfunction, unspecified: Secondary | ICD-10-CM | POA: Diagnosis not present

## 2015-12-29 DIAGNOSIS — Z8546 Personal history of malignant neoplasm of prostate: Secondary | ICD-10-CM | POA: Diagnosis not present

## 2015-12-29 DIAGNOSIS — C61 Malignant neoplasm of prostate: Secondary | ICD-10-CM | POA: Diagnosis not present

## 2016-01-13 DIAGNOSIS — Z006 Encounter for examination for normal comparison and control in clinical research program: Secondary | ICD-10-CM | POA: Diagnosis not present

## 2016-01-13 DIAGNOSIS — Z5111 Encounter for antineoplastic chemotherapy: Secondary | ICD-10-CM | POA: Diagnosis not present

## 2016-01-13 DIAGNOSIS — E858 Other amyloidosis: Secondary | ICD-10-CM | POA: Diagnosis not present

## 2016-01-29 ENCOUNTER — Other Ambulatory Visit (HOSPITAL_BASED_OUTPATIENT_CLINIC_OR_DEPARTMENT_OTHER): Payer: Medicare Other

## 2016-01-29 DIAGNOSIS — C9000 Multiple myeloma not having achieved remission: Secondary | ICD-10-CM

## 2016-01-29 DIAGNOSIS — C9001 Multiple myeloma in remission: Secondary | ICD-10-CM

## 2016-01-29 LAB — CBC WITH DIFFERENTIAL/PLATELET
BASO%: 0.9 % (ref 0.0–2.0)
Basophils Absolute: 0 10*3/uL (ref 0.0–0.1)
EOS%: 2.3 % (ref 0.0–7.0)
Eosinophils Absolute: 0.1 10*3/uL (ref 0.0–0.5)
HCT: 41 % (ref 38.4–49.9)
HGB: 13.6 g/dL (ref 13.0–17.1)
LYMPH%: 28.5 % (ref 14.0–49.0)
MCH: 31.4 pg (ref 27.2–33.4)
MCHC: 33.1 g/dL (ref 32.0–36.0)
MCV: 94.7 fL (ref 79.3–98.0)
MONO#: 0.6 10*3/uL (ref 0.1–0.9)
MONO%: 11.3 % (ref 0.0–14.0)
NEUT#: 3.1 10*3/uL (ref 1.5–6.5)
NEUT%: 57 % (ref 39.0–75.0)
Platelets: 142 10*3/uL (ref 140–400)
RBC: 4.33 10*6/uL (ref 4.20–5.82)
RDW: 13.9 % (ref 11.0–14.6)
WBC: 5.5 10*3/uL (ref 4.0–10.3)
lymph#: 1.6 10*3/uL (ref 0.9–3.3)

## 2016-01-29 LAB — COMPREHENSIVE METABOLIC PANEL
ALT: 10 U/L (ref 0–55)
AST: 16 U/L (ref 5–34)
Albumin: 3.5 g/dL (ref 3.5–5.0)
Alkaline Phosphatase: 92 U/L (ref 40–150)
Anion Gap: 7 mEq/L (ref 3–11)
BUN: 21.5 mg/dL (ref 7.0–26.0)
CO2: 24 mEq/L (ref 22–29)
Calcium: 9.1 mg/dL (ref 8.4–10.4)
Chloride: 109 mEq/L (ref 98–109)
Creatinine: 1.3 mg/dL (ref 0.7–1.3)
EGFR: 55 mL/min/{1.73_m2} — ABNORMAL LOW (ref >90)
Glucose: 105 mg/dl (ref 70–140)
Potassium: 4.4 mEq/L (ref 3.5–5.1)
Sodium: 140 mEq/L (ref 136–145)
Total Bilirubin: 0.41 mg/dL (ref 0.20–1.20)
Total Protein: 6.5 g/dL (ref 6.4–8.3)

## 2016-01-30 LAB — KAPPA/LAMBDA LIGHT CHAINS
Ig Kappa Free Light Chain: 20.49 mg/L — ABNORMAL HIGH (ref 3.30–19.40)
Ig Lambda Free Light Chain: 39.22 mg/L — ABNORMAL HIGH (ref 5.71–26.30)
Kappa/Lambda FluidC Ratio: 0.52 (ref 0.26–1.65)

## 2016-02-02 LAB — IMMUNOFIXATION ELECTROPHORESIS
IgA, Qn, Serum: 184 mg/dL (ref 61–437)
IgG, Qn, Serum: 827 mg/dL (ref 700–1600)
IgM, Qn, Serum: 107 mg/dL (ref 20–172)
Total Protein: 6.1 g/dL (ref 6.0–8.5)

## 2016-02-05 ENCOUNTER — Ambulatory Visit (HOSPITAL_BASED_OUTPATIENT_CLINIC_OR_DEPARTMENT_OTHER): Payer: Medicare Other | Admitting: Oncology

## 2016-02-05 ENCOUNTER — Telehealth: Payer: Self-pay | Admitting: Oncology

## 2016-02-05 VITALS — BP 127/65 | HR 87 | Temp 98.0°F | Resp 20 | Ht 73.0 in | Wt 208.1 lb

## 2016-02-05 DIAGNOSIS — C9000 Multiple myeloma not having achieved remission: Secondary | ICD-10-CM | POA: Diagnosis not present

## 2016-02-05 DIAGNOSIS — E858 Other amyloidosis: Secondary | ICD-10-CM | POA: Diagnosis not present

## 2016-02-05 DIAGNOSIS — Z8546 Personal history of malignant neoplasm of prostate: Secondary | ICD-10-CM

## 2016-02-05 DIAGNOSIS — E8581 Light chain (AL) amyloidosis: Secondary | ICD-10-CM

## 2016-02-05 NOTE — Progress Notes (Signed)
[name] OFFICE PROGRESS NOTE   Diagnosis:  amyloidosis  INTERVAL HISTORY:    [name] returns as scheduled. He feels well. He continues to bruise easily.He has enrolled on a clinical trial at [facility] for treatment of patients with cardiac amyloidosis. He reports receiving 2 treatments to date. No apparent side effects.   Objective:  Vital signs in last 24 hours:  Blood pressure 127/65, pulse 87, temperature 98 F (36.7 C), temperature source Oral, resp. rate 20, height 6' 1"  (1.854 m), weight 208 lb 1.6 oz (94.394 kg), SpO2 100 %.    HEENT:  Macroglossia Resp:  Lungs clear bilaterally Cardio:  Regular rate and rhythm GI:  No hepatosplenomegaly Vascular:  The right lower leg is slightly larger than the left side  Skin: small periorbital ecchymoses     Lab Results:  Lab Results  Component Value Date   WBC 5.5 01/29/2016   HGB 13.6 01/29/2016   HCT 41.0 01/29/2016   MCV 94.7 01/29/2016   PLT 142 01/29/2016   NEUTROABS 3.1 01/29/2016    BUN 21.5, creatinine 1.3, IgG 827, kappa free light chains 20.49 mg/L, lambda free light chains 39.22 mg/L  Medications: I have reviewed the patient's current medications.  Assessment/Plan: 1. 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 2. "Bruising" at the eyelids and mouth: Likely related to amyloidosis, persistent. 3. Numbness and loss of vibratory sense at the fingertips: This predated Velcade-based therapy, but worsened. Now much improved following cervical spine surgery. 4. Elevated serum free lambda light chains. The lambda light chains were lower on November 16 and slightly higher on 12/14/2011. 5. Lambda light chain proteinuria. 6. Bone marrow plasmacytosis: Variable increase in plasma cells noted on the bone marrow biopsy 07/29/2011 with plasma cells estimated to represent between 4% and 20% of the cellular population. 7. Remote history of prostate cancer. 8. Sleep apnea. 9. Dyslipidemia. 10. Report of  pneumonia on 2 occasions in 2011. 11. Admission to a hospital in [place], [place] October 2012 with "pneumonia." A chest x-ray at [facility] on November 2 was negative .  12. Low serum immunoglobulin G level. 13. Plasma cell dyscrasia with associated amyloidosis.  1. Initiation of systemic therapy with Cytoxan, Velcade, and Decadron 08/12/2011. Cycle #2 was initiated on 09/16/2011. Cycle #3 was initiated on 10/15/2011. Velcade was placed on hold due to neuropathy. 2. The serum free lambda light chains were decreased on October 08 2011. 3. The serum free lambda light chains were slightly increased on 12/14/2011, lower on 12/29/2011 and 02/02/2012. 4. Initiation of Revlimid/Decadron February 2013, cycle 2 started on 01/28/2012. 5. High-dose melphalan chemotherapy followed by autologous stem cell infusion on 02/09/2013 6. Initiation of Velcade/Decadrontherapy 05/28/2013, he completed 2 cycles on a day 1, 4, 8, 11 schedule 7. Improvement in the serum free lambda light chains following induction Velcade/Decadron  8. Initiation of weekly Velcade/Decadron on 07/16/2013, last treatment on 10/16/2013 9. Improvement in the serum free lambda light chains on 08/21/2013 and 09/18/2013 ([facility]) 10. Normal serum free lambda light chains on 12/13/2013 11. Mild increase of the serum free lambda light chains beginning January 2015-stable 12.  treatment on the Pronto study at [facility] beginning 12/17/2015 15. Loss of "balance "and proximal motor weakness secondary to cervical stenosis-status post decompression surgery on 02/19/2012. Improved, but not resolved stenosis with mass effect on the spinal cord noted on a repeat MRI 03/28/2012. He underwent further decompression surgery on 04/06/2012. The ataxia, weakness, and peripheral numbness is much improved.  16. Right lower Extremity deep  vein thrombosis 02/02/2012-a Doppler ultrasound confirmed a gastrocnemius and peroneal deep vein thrombosis. An IVC filter was  placed prior to surgery and he was maintained on Lovenox. The IVC filter was removed on 05/04/2012.  17. Anorexia following cervical spine surgery. Improved.  18. Episode of flank pain and hematuria in 03/31/2012-etiology unclear. He completed a course of antibiotics. The hematuria and pain have resolved.  19. Rectal wall thickening noted on a CT of the pelvis 03/31/2012.  20. colonoscopy showed an area of abnormality at the rectum with biopsy positive for amyloid.  21. Lung nodule noted on the CT 03/31/2012 , no lung nodule on a chest CT 07/30/2013  22. Lumbar stenosis-status post surgery on 07/31/2012.  23. Status post stem cell collection.  24.Rright internal jugular thrombosis, 10/22/2012, likely related to pheresis catheter and HIT  25. Admission with acute right pulmonary embolism 10/27/2012  26. Superior mesenteric vein/portal vein thrombosis diagnosed on a CT of the abdomen 10/28/2012 and 10/29/2012-treated with Argatroban , maintained on Coumadin anticoagulation until he hospital admission March 2014 for stem cell therapy  27. HIT confirmed on a serotonin release assay 10/29/2012  28. History of Renal insufficiency during the December 2013 hospital admission-likely related to dehydration and? Contrast nephropathy , persistent mild elevation of the creatinine  29. Dysphagia-? Secondary to amyloidosis, reported when he was here on 05/08/2013. He did not complain of dysphagia today  30. Anemia secondary to multiple myeloma and chemotherapy , improved  31.right lower Extremity deep vein thrombosis diagnosed at [facility] July 2014 , repeat Doppler at [facility] 07/19/2013 with chronic thrombus in the right gastrocnemius, Eliquis has been discontinued  32. Fall August 2014 with a left shoulder injury, diagnosed with a rotator cuff tear, status post surgical repair 09/19/2013  33. 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 The neuropathy symptoms have partially improved  since Velcade was discontinued after treatment 10/16/2013  34. Cardiac MRI 05/22/2015-normal left ventricular size with severe concentric hypertrophy and mildly impaired systolic function, [id number]%, right ventricular hypertrophy, diffuse late gadolinium enhancement in the left and right ventricles-findings consistent with cardiac amyloidosis      Disposition:   he appears unchanged. He is now being treated on a study at [facility] for patients with cardiac amyloidosis. He continues close follow-up in the bone marrow transplant clinic at [facility]. Mr.Poust will return for an office visit here in 6 months. I am available to see him in the interim as needed.  Betsy Coder, MD  02/05/2016  9:53 AM

## 2016-02-05 NOTE — Telephone Encounter (Signed)
Gave adn printed appt sched and avs for pt for Sept °

## 2016-02-10 DIAGNOSIS — Z006 Encounter for examination for normal comparison and control in clinical research program: Secondary | ICD-10-CM | POA: Diagnosis not present

## 2016-02-10 DIAGNOSIS — I43 Cardiomyopathy in diseases classified elsewhere: Secondary | ICD-10-CM | POA: Diagnosis not present

## 2016-02-10 DIAGNOSIS — E854 Organ-limited amyloidosis: Secondary | ICD-10-CM | POA: Diagnosis not present

## 2016-02-10 DIAGNOSIS — E858 Other amyloidosis: Secondary | ICD-10-CM | POA: Diagnosis not present

## 2016-02-13 DIAGNOSIS — M4324 Fusion of spine, thoracic region: Secondary | ICD-10-CM | POA: Diagnosis not present

## 2016-02-13 DIAGNOSIS — M5134 Other intervertebral disc degeneration, thoracic region: Secondary | ICD-10-CM | POA: Diagnosis not present

## 2016-02-13 DIAGNOSIS — Z6827 Body mass index (BMI) 27.0-27.9, adult: Secondary | ICD-10-CM | POA: Diagnosis not present

## 2016-02-18 ENCOUNTER — Encounter: Payer: Self-pay | Admitting: Oncology

## 2016-03-09 DIAGNOSIS — I43 Cardiomyopathy in diseases classified elsewhere: Secondary | ICD-10-CM | POA: Diagnosis not present

## 2016-03-09 DIAGNOSIS — Z86718 Personal history of other venous thrombosis and embolism: Secondary | ICD-10-CM | POA: Diagnosis not present

## 2016-03-09 DIAGNOSIS — Z006 Encounter for examination for normal comparison and control in clinical research program: Secondary | ICD-10-CM | POA: Diagnosis not present

## 2016-03-09 DIAGNOSIS — R0609 Other forms of dyspnea: Secondary | ICD-10-CM | POA: Diagnosis not present

## 2016-03-09 DIAGNOSIS — E858 Other amyloidosis: Secondary | ICD-10-CM | POA: Diagnosis not present

## 2016-03-09 DIAGNOSIS — E854 Organ-limited amyloidosis: Secondary | ICD-10-CM | POA: Diagnosis not present

## 2016-03-17 DIAGNOSIS — Z86718 Personal history of other venous thrombosis and embolism: Secondary | ICD-10-CM | POA: Diagnosis not present

## 2016-03-17 DIAGNOSIS — E858 Other amyloidosis: Secondary | ICD-10-CM | POA: Diagnosis not present

## 2016-03-17 DIAGNOSIS — Z7982 Long term (current) use of aspirin: Secondary | ICD-10-CM | POA: Diagnosis not present

## 2016-03-17 DIAGNOSIS — G629 Polyneuropathy, unspecified: Secondary | ICD-10-CM | POA: Diagnosis not present

## 2016-03-17 DIAGNOSIS — Z86711 Personal history of pulmonary embolism: Secondary | ICD-10-CM | POA: Diagnosis not present

## 2016-03-17 DIAGNOSIS — R0609 Other forms of dyspnea: Secondary | ICD-10-CM | POA: Diagnosis not present

## 2016-03-17 DIAGNOSIS — I43 Cardiomyopathy in diseases classified elsewhere: Secondary | ICD-10-CM | POA: Diagnosis not present

## 2016-03-17 DIAGNOSIS — R5383 Other fatigue: Secondary | ICD-10-CM | POA: Diagnosis not present

## 2016-03-17 DIAGNOSIS — Z888 Allergy status to other drugs, medicaments and biological substances status: Secondary | ICD-10-CM | POA: Diagnosis not present

## 2016-03-17 DIAGNOSIS — Z6827 Body mass index (BMI) 27.0-27.9, adult: Secondary | ICD-10-CM | POA: Diagnosis not present

## 2016-03-17 DIAGNOSIS — Z79899 Other long term (current) drug therapy: Secondary | ICD-10-CM | POA: Diagnosis not present

## 2016-03-17 DIAGNOSIS — K148 Other diseases of tongue: Secondary | ICD-10-CM | POA: Diagnosis not present

## 2016-03-17 DIAGNOSIS — E854 Organ-limited amyloidosis: Secondary | ICD-10-CM | POA: Diagnosis not present

## 2016-03-17 DIAGNOSIS — R600 Localized edema: Secondary | ICD-10-CM | POA: Diagnosis not present

## 2016-04-06 DIAGNOSIS — E858 Other amyloidosis: Secondary | ICD-10-CM | POA: Diagnosis not present

## 2016-04-06 DIAGNOSIS — Z006 Encounter for examination for normal comparison and control in clinical research program: Secondary | ICD-10-CM | POA: Diagnosis not present

## 2016-04-06 DIAGNOSIS — I81 Portal vein thrombosis: Secondary | ICD-10-CM | POA: Diagnosis not present

## 2016-04-06 DIAGNOSIS — Z5112 Encounter for antineoplastic immunotherapy: Secondary | ICD-10-CM | POA: Diagnosis not present

## 2016-04-06 DIAGNOSIS — R591 Generalized enlarged lymph nodes: Secondary | ICD-10-CM | POA: Diagnosis not present

## 2016-04-06 DIAGNOSIS — Z9481 Bone marrow transplant status: Secondary | ICD-10-CM | POA: Diagnosis not present

## 2016-05-05 DIAGNOSIS — Z86718 Personal history of other venous thrombosis and embolism: Secondary | ICD-10-CM | POA: Diagnosis not present

## 2016-05-05 DIAGNOSIS — Z006 Encounter for examination for normal comparison and control in clinical research program: Secondary | ICD-10-CM | POA: Diagnosis not present

## 2016-05-05 DIAGNOSIS — E858 Other amyloidosis: Secondary | ICD-10-CM | POA: Diagnosis not present

## 2016-05-18 DIAGNOSIS — R05 Cough: Secondary | ICD-10-CM | POA: Diagnosis not present

## 2016-05-18 DIAGNOSIS — J189 Pneumonia, unspecified organism: Secondary | ICD-10-CM | POA: Diagnosis not present

## 2016-05-18 DIAGNOSIS — J90 Pleural effusion, not elsewhere classified: Secondary | ICD-10-CM | POA: Diagnosis not present

## 2016-05-19 ENCOUNTER — Other Ambulatory Visit: Payer: Self-pay | Admitting: *Deleted

## 2016-05-19 ENCOUNTER — Telehealth: Payer: Self-pay | Admitting: *Deleted

## 2016-05-19 NOTE — Telephone Encounter (Signed)
Call received from patient to inform Dr. [name] that he went to walk-in-clinic yesterday, had CXR done and has a "spot of pneumonia" in his left lung.  Pt was prescribed an antibiotic, mucinex and tessalon perles by clinic.  Pt would like to know if Dr. [name] would like to see him prior to Saturday, when pt is leaving on vacation.  Per Dr. [name], pt does not need to be seen at [facility] unless he is not improving or having fevers.  Patient informed of MD instructions and verbalizes an understanding of instructions.

## 2016-05-21 DIAGNOSIS — R05 Cough: Secondary | ICD-10-CM | POA: Diagnosis not present

## 2016-05-21 DIAGNOSIS — R042 Hemoptysis: Secondary | ICD-10-CM | POA: Diagnosis not present

## 2016-05-21 DIAGNOSIS — J189 Pneumonia, unspecified organism: Secondary | ICD-10-CM | POA: Diagnosis not present

## 2016-06-01 DIAGNOSIS — R911 Solitary pulmonary nodule: Secondary | ICD-10-CM | POA: Diagnosis not present

## 2016-06-01 DIAGNOSIS — Z86718 Personal history of other venous thrombosis and embolism: Secondary | ICD-10-CM | POA: Diagnosis not present

## 2016-06-01 DIAGNOSIS — R0609 Other forms of dyspnea: Secondary | ICD-10-CM | POA: Diagnosis not present

## 2016-06-01 DIAGNOSIS — E858 Other amyloidosis: Secondary | ICD-10-CM | POA: Diagnosis not present

## 2016-06-01 DIAGNOSIS — E854 Organ-limited amyloidosis: Secondary | ICD-10-CM | POA: Diagnosis not present

## 2016-06-01 DIAGNOSIS — Z006 Encounter for examination for normal comparison and control in clinical research program: Secondary | ICD-10-CM | POA: Diagnosis not present

## 2016-06-01 DIAGNOSIS — R938 Abnormal findings on diagnostic imaging of other specified body structures: Secondary | ICD-10-CM | POA: Diagnosis not present

## 2016-06-11 DIAGNOSIS — E041 Nontoxic single thyroid nodule: Secondary | ICD-10-CM | POA: Diagnosis not present

## 2016-06-11 DIAGNOSIS — R938 Abnormal findings on diagnostic imaging of other specified body structures: Secondary | ICD-10-CM | POA: Diagnosis not present

## 2016-06-11 DIAGNOSIS — R918 Other nonspecific abnormal finding of lung field: Secondary | ICD-10-CM | POA: Diagnosis not present

## 2016-06-17 DIAGNOSIS — C44212 Basal cell carcinoma of skin of right ear and external auricular canal: Secondary | ICD-10-CM | POA: Diagnosis not present

## 2016-06-17 DIAGNOSIS — L821 Other seborrheic keratosis: Secondary | ICD-10-CM | POA: Diagnosis not present

## 2016-06-17 DIAGNOSIS — L570 Actinic keratosis: Secondary | ICD-10-CM | POA: Diagnosis not present

## 2016-06-17 DIAGNOSIS — D485 Neoplasm of uncertain behavior of skin: Secondary | ICD-10-CM | POA: Diagnosis not present

## 2016-06-17 DIAGNOSIS — C4441 Basal cell carcinoma of skin of scalp and neck: Secondary | ICD-10-CM | POA: Diagnosis not present

## 2016-06-17 DIAGNOSIS — D225 Melanocytic nevi of trunk: Secondary | ICD-10-CM | POA: Diagnosis not present

## 2016-06-17 DIAGNOSIS — Z85828 Personal history of other malignant neoplasm of skin: Secondary | ICD-10-CM | POA: Diagnosis not present

## 2016-06-29 DIAGNOSIS — E858 Other amyloidosis: Secondary | ICD-10-CM | POA: Diagnosis not present

## 2016-06-29 DIAGNOSIS — Z006 Encounter for examination for normal comparison and control in clinical research program: Secondary | ICD-10-CM | POA: Diagnosis not present

## 2016-06-29 DIAGNOSIS — E859 Amyloidosis, unspecified: Secondary | ICD-10-CM | POA: Diagnosis not present

## 2016-06-29 DIAGNOSIS — R918 Other nonspecific abnormal finding of lung field: Secondary | ICD-10-CM | POA: Diagnosis not present

## 2016-06-29 DIAGNOSIS — I81 Portal vein thrombosis: Secondary | ICD-10-CM | POA: Diagnosis not present

## 2016-06-29 DIAGNOSIS — E042 Nontoxic multinodular goiter: Secondary | ICD-10-CM | POA: Diagnosis not present

## 2016-06-29 DIAGNOSIS — E041 Nontoxic single thyroid nodule: Secondary | ICD-10-CM | POA: Diagnosis not present

## 2016-07-01 ENCOUNTER — Encounter: Payer: Self-pay | Admitting: *Deleted

## 2016-07-01 NOTE — Progress Notes (Signed)
Call received from pt to inform Dr. [name] that he will be having a biopsy of nodules on his thyroid this week at [facility].  Dr. [name] notified.

## 2016-07-06 DIAGNOSIS — H2513 Age-related nuclear cataract, bilateral: Secondary | ICD-10-CM | POA: Diagnosis not present

## 2016-07-06 DIAGNOSIS — H52203 Unspecified astigmatism, bilateral: Secondary | ICD-10-CM | POA: Diagnosis not present

## 2016-07-08 DIAGNOSIS — E042 Nontoxic multinodular goiter: Secondary | ICD-10-CM | POA: Diagnosis not present

## 2016-07-08 DIAGNOSIS — E049 Nontoxic goiter, unspecified: Secondary | ICD-10-CM | POA: Diagnosis not present

## 2016-07-08 DIAGNOSIS — E041 Nontoxic single thyroid nodule: Secondary | ICD-10-CM | POA: Diagnosis not present

## 2016-07-14 DIAGNOSIS — J209 Acute bronchitis, unspecified: Secondary | ICD-10-CM | POA: Diagnosis not present

## 2016-07-28 DIAGNOSIS — Z87891 Personal history of nicotine dependence: Secondary | ICD-10-CM | POA: Diagnosis not present

## 2016-07-28 DIAGNOSIS — E858 Other amyloidosis: Secondary | ICD-10-CM | POA: Diagnosis not present

## 2016-07-28 DIAGNOSIS — Z86718 Personal history of other venous thrombosis and embolism: Secondary | ICD-10-CM | POA: Diagnosis not present

## 2016-07-28 DIAGNOSIS — E042 Nontoxic multinodular goiter: Secondary | ICD-10-CM | POA: Diagnosis not present

## 2016-07-28 DIAGNOSIS — Z006 Encounter for examination for normal comparison and control in clinical research program: Secondary | ICD-10-CM | POA: Diagnosis not present

## 2016-07-28 DIAGNOSIS — R0609 Other forms of dyspnea: Secondary | ICD-10-CM | POA: Diagnosis not present

## 2016-07-28 DIAGNOSIS — R918 Other nonspecific abnormal finding of lung field: Secondary | ICD-10-CM | POA: Diagnosis not present

## 2016-07-29 ENCOUNTER — Telehealth: Payer: Self-pay | Admitting: *Deleted

## 2016-07-29 DIAGNOSIS — C9000 Multiple myeloma not having achieved remission: Secondary | ICD-10-CM

## 2016-07-29 DIAGNOSIS — E8581 Light chain (AL) amyloidosis: Secondary | ICD-10-CM

## 2016-07-29 DIAGNOSIS — E785 Hyperlipidemia, unspecified: Secondary | ICD-10-CM

## 2016-07-29 DIAGNOSIS — G629 Polyneuropathy, unspecified: Secondary | ICD-10-CM

## 2016-07-29 NOTE — Telephone Encounter (Signed)
Ok, please add tsh to labs and schedule injection appt.

## 2016-07-29 NOTE — Telephone Encounter (Signed)
"  I have an appointment with Dr. [name] 08-05-2016 and would like to receive the Tetanus booster and influenza vaccine.  I also would like the Thyroid stimulating hormone lab checked when I come in as well.  I am on an Amyloidosis program at [facility] Dr. [name] is aware of.  Return number [phone]."     Will notify Dr. [name] of these requests.  [facility] does not have influenza vaccine in stock at this time.  The Tetanus and TSH could be a PCP treatment.

## 2016-07-30 ENCOUNTER — Other Ambulatory Visit: Payer: Self-pay | Admitting: *Deleted

## 2016-07-30 DIAGNOSIS — G629 Polyneuropathy, unspecified: Secondary | ICD-10-CM

## 2016-07-30 DIAGNOSIS — E785 Hyperlipidemia, unspecified: Secondary | ICD-10-CM

## 2016-07-30 DIAGNOSIS — K55069 Acute infarction of intestine, part and extent unspecified: Secondary | ICD-10-CM

## 2016-07-30 DIAGNOSIS — E8581 Light chain (AL) amyloidosis: Secondary | ICD-10-CM

## 2016-07-30 DIAGNOSIS — I81 Portal vein thrombosis: Secondary | ICD-10-CM

## 2016-07-30 DIAGNOSIS — E854 Organ-limited amyloidosis: Secondary | ICD-10-CM

## 2016-07-30 DIAGNOSIS — I43 Cardiomyopathy in diseases classified elsewhere: Secondary | ICD-10-CM

## 2016-08-02 ENCOUNTER — Other Ambulatory Visit: Payer: Self-pay | Admitting: *Deleted

## 2016-08-02 DIAGNOSIS — E8581 Light chain (AL) amyloidosis: Secondary | ICD-10-CM

## 2016-08-02 NOTE — Telephone Encounter (Signed)
Called patient informing him to expect lab and injection on 08-05-2016.  Scheduling request sent to schedulers as ordered by Dr. [name].

## 2016-08-05 ENCOUNTER — Other Ambulatory Visit (HOSPITAL_BASED_OUTPATIENT_CLINIC_OR_DEPARTMENT_OTHER): Payer: Medicare Other

## 2016-08-05 ENCOUNTER — Ambulatory Visit (HOSPITAL_BASED_OUTPATIENT_CLINIC_OR_DEPARTMENT_OTHER): Payer: Medicare Other | Admitting: Oncology

## 2016-08-05 ENCOUNTER — Ambulatory Visit: Payer: Medicare Other

## 2016-08-05 ENCOUNTER — Telehealth: Payer: Self-pay | Admitting: *Deleted

## 2016-08-05 VITALS — BP 138/81 | HR 65 | Temp 98.7°F | Resp 18 | Ht 73.0 in | Wt 202.2 lb

## 2016-08-05 DIAGNOSIS — E858 Other amyloidosis: Secondary | ICD-10-CM | POA: Diagnosis not present

## 2016-08-05 DIAGNOSIS — G629 Polyneuropathy, unspecified: Secondary | ICD-10-CM

## 2016-08-05 DIAGNOSIS — E042 Nontoxic multinodular goiter: Secondary | ICD-10-CM | POA: Diagnosis not present

## 2016-08-05 DIAGNOSIS — Z8546 Personal history of malignant neoplasm of prostate: Secondary | ICD-10-CM

## 2016-08-05 DIAGNOSIS — Z853 Personal history of malignant neoplasm of breast: Secondary | ICD-10-CM

## 2016-08-05 DIAGNOSIS — E785 Hyperlipidemia, unspecified: Secondary | ICD-10-CM

## 2016-08-05 DIAGNOSIS — C9000 Multiple myeloma not having achieved remission: Secondary | ICD-10-CM

## 2016-08-05 DIAGNOSIS — Z86718 Personal history of other venous thrombosis and embolism: Secondary | ICD-10-CM | POA: Diagnosis not present

## 2016-08-05 DIAGNOSIS — Z23 Encounter for immunization: Secondary | ICD-10-CM

## 2016-08-05 DIAGNOSIS — E8581 Light chain (AL) amyloidosis: Secondary | ICD-10-CM

## 2016-08-05 LAB — CBC WITH DIFFERENTIAL/PLATELET
BASO%: 0.3 % (ref 0.0–2.0)
Basophils Absolute: 0 10*3/uL (ref 0.0–0.1)
EOS%: 2.4 % (ref 0.0–7.0)
Eosinophils Absolute: 0.1 10*3/uL (ref 0.0–0.5)
HCT: 39.1 % (ref 38.4–49.9)
HGB: 13.1 g/dL (ref 13.0–17.1)
LYMPH%: 32.1 % (ref 14.0–49.0)
MCH: 31.3 pg (ref 27.2–33.4)
MCHC: 33.5 g/dL (ref 32.0–36.0)
MCV: 93.3 fL (ref 79.3–98.0)
MONO#: 0.8 10*3/uL (ref 0.1–0.9)
MONO%: 13 % (ref 0.0–14.0)
NEUT#: 3.1 10*3/uL (ref 1.5–6.5)
NEUT%: 52.2 % (ref 39.0–75.0)
Platelets: 155 10*3/uL (ref 140–400)
RBC: 4.19 10*6/uL — ABNORMAL LOW (ref 4.20–5.82)
RDW: 13.9 % (ref 11.0–14.6)
WBC: 5.9 10*3/uL (ref 4.0–10.3)
lymph#: 1.9 10*3/uL (ref 0.9–3.3)

## 2016-08-05 LAB — TSH: TSH: 1.135 m(IU)/L (ref 0.320–4.118)

## 2016-08-05 MED ORDER — INFLUENZA VAC SPLIT QUAD 0.5 ML IM SUSY
0.5000 mL | PREFILLED_SYRINGE | Freq: Once | INTRAMUSCULAR | Status: AC
  Administered 2016-08-05: 0.5 mL via INTRAMUSCULAR
  Filled 2016-08-05: qty 0.5

## 2016-08-05 NOTE — Progress Notes (Signed)
[place] OFFICE PROGRESS NOTE    Diagnosis: Amyloidosis  INTERVAL HISTORY:   [name] returns as scheduled. He continues treatment on an amyloidosis study at [facility]. He feels well. He is undergoing an evaluation at [facility] endocrinology for thyroid nodules.  Objective:  Vital signs in last 24 hours:  Blood pressure 138/81, pulse 65, temperature 98.7 F (37.1 C), temperature source Oral, resp. rate 18, height 6' 1"  (1.854 m), weight 202 lb 3.2 oz (91.7 kg), SpO2 99 %.    HEENT: No thrush or bleeding Resp: Lungs clear bilaterally Cardio: Regular rate and rhythm GI: No hepatosplenomegaly Vascular: The right lower leg is slightly larger than the left side  Skin: Ecchymosis surrounding left eye    Lab Results:  Lab Results  Component Value Date   WBC 5.9 08/05/2016   HGB 13.1 08/05/2016   HCT 39.1 08/05/2016   MCV 93.3 08/05/2016   PLT 155 08/05/2016   NEUTROABS 3.1 08/05/2016    Free lambda light chains on 07/28/2016 at [facility]: 5.66, creatinine 1.4  Medications: I have reviewed the patient's current medications.  Assessment/Plan: 1. 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 2. "Bruising" at the eyelids and mouth: Likely related to amyloidosis, persistent. 3. Numbness and loss of vibratory sense at the fingertips: This predated Velcade-based therapy, but worsened. Now much improved following cervical spine surgery. 4. Elevated serum free lambda light chains. The lambda light chains were lower on November 16 and slightly higher on 12/14/2011. 5. Lambda light chain proteinuria. 6. Bone marrow plasmacytosis: Variable increase in plasma cells noted on the bone marrow biopsy 07/29/2011 with plasma cells estimated to represent between 4% and 20% of the cellular population. 7. Remote history of prostate cancer. 8. Sleep apnea. 9. Dyslipidemia. 10. Report of pneumonia on 2 occasions in 2011. 11. Admission to a hospital in [place], [place] October 2012  with "pneumonia." A chest x-ray at [facility] on November 2 was negative .  12. Low serum immunoglobulin G level. 13. Plasma cell dyscrasia with associated amyloidosis.  1. Initiation of systemic therapy with Cytoxan, Velcade, and Decadron 08/12/2011. Cycle #2 was initiated on 09/16/2011. Cycle #3 was initiated on 10/15/2011. Velcade was placed on hold due to neuropathy. 2. The serum free lambda light chains were decreased on October 08 2011. 3. The serum free lambda light chains were slightly increased on 12/14/2011, lower on 12/29/2011 and 02/02/2012. 4. Initiation of Revlimid/Decadron February 2013, cycle 2 started on 01/28/2012. 5. High-dose melphalan chemotherapy followed by autologous stem cell infusion on 02/09/2013 6. Initiation of Velcade/Decadrontherapy 05/28/2013, he completed 2 cycles on a day 1, 4, 8, 11 schedule 7. Improvement in the serum free lambda light chains following induction Velcade/Decadron  8. Initiation of weekly Velcade/Decadron on 07/16/2013, last treatment on 10/16/2013 9. Improvement in the serum free lambda light chains on 08/21/2013 and 09/18/2013 ([facility]) 10. Normal serum free lambda light chains on 12/13/2013 11. Mild increase of the serum free lambda light chains beginning January 2015-stable 12.  treatment on the Pronto study at [facility] beginning 12/17/2015 15. Loss of "balance "and proximal motor weakness secondary to cervical stenosis-status post decompression surgery on 02/19/2012. Improved, but not resolved stenosis with mass effect on the spinal cord noted on a repeat MRI 03/28/2012. He underwent further decompression surgery on 04/06/2012. The ataxia, weakness, and peripheral numbness is much improved.  16. Right lower Extremity deep vein thrombosis 02/02/2012-a Doppler ultrasound confirmed a gastrocnemius and peroneal deep vein thrombosis. An IVC filter was placed prior to surgery and  he was maintained on Lovenox. The IVC filter was removed on 05/04/2012.   17. Anorexia following cervical spine surgery. Improved.  18. Episode of flank pain and hematuria in 03/31/2012-etiology unclear. He completed a course of antibiotics. The hematuria and pain have resolved.  19. Rectal wall thickening noted on a CT of the pelvis 03/31/2012.  20. colonoscopy showed an area of abnormality at the rectum with biopsy positive for amyloid.  21. Lung nodule noted on the CT 03/31/2012 , no lung nodule on a chest CT 07/30/2013  22. Lumbar stenosis-status post surgery on 07/31/2012.  23. Status post stem cell collection.  24.Rright internal jugular thrombosis, 10/22/2012, likely related to pheresis catheter and HIT  25. Admission with acute right pulmonary embolism 10/27/2012  26. Superior mesenteric vein/portal vein thrombosis diagnosed on a CT of the abdomen 10/28/2012 and 10/29/2012-treated with Argatroban , maintained on Coumadin anticoagulation until he hospital admission March 2014 for stem cell therapy  27. HIT confirmed on a serotonin release assay 10/29/2012  28. History of Renal insufficiency during the December 2013 hospital admission-likely related to dehydration and? Contrast nephropathy , persistent mild elevation of the creatinine  29. Dysphagia-? Secondary to amyloidosis, reported when he was here on 05/08/2013. He did not complain of dysphagia today  30. Anemia secondary to multiple myeloma and chemotherapy , improved  31.right lower Extremity deep vein thrombosis diagnosed at [facility] July 2014 , repeat Doppler at [facility] 07/19/2013 with chronic thrombus in the right gastrocnemius, Eliquis has been discontinued  32. Fall August 2014 with a left shoulder injury, diagnosed with a rotator cuff tear, status post surgical repair 09/19/2013  33. 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 The neuropathy symptoms have partially improved since Velcade was discontinued after treatment 10/16/2013  34. Cardiac MRI 05/22/2015-normal left  ventricular size with severe concentric hypertrophy and mildly impaired systolic function, BDHD-89%, right ventricular hypertrophy, diffuse late gadolinium enhancement in the left and right ventricles-findings consistent with cardiac amyloidosis 35. Multinodular goiter confirmed on thyroid ultrasound at [facility] 06/29/2016, followed by [facility] endocrinology    Disposition: Mr.Gamero appears unchanged. He will continue treatment on the Pronto study at [facility]. We administered an influenza vaccine today. He is scheduled to see Dr. Amalia Hailey next month.  He will return for an office visit here in 4 months.        Betsy Coder, MD  08/05/2016  10:00 AM

## 2016-08-05 NOTE — Progress Notes (Signed)
Injection was given by Desk nurse Deland Pretty, RN

## 2016-08-05 NOTE — Telephone Encounter (Signed)
Called pt with normal TSH result. He voiced understanding.

## 2016-08-12 ENCOUNTER — Telehealth: Payer: Self-pay | Admitting: Oncology

## 2016-08-12 NOTE — Telephone Encounter (Signed)
Spoke with patient re f/u 12/03/16

## 2016-08-24 DIAGNOSIS — R918 Other nonspecific abnormal finding of lung field: Secondary | ICD-10-CM | POA: Diagnosis not present

## 2016-08-24 DIAGNOSIS — I43 Cardiomyopathy in diseases classified elsewhere: Secondary | ICD-10-CM | POA: Diagnosis not present

## 2016-08-24 DIAGNOSIS — E854 Organ-limited amyloidosis: Secondary | ICD-10-CM | POA: Diagnosis not present

## 2016-08-24 DIAGNOSIS — E042 Nontoxic multinodular goiter: Secondary | ICD-10-CM | POA: Diagnosis not present

## 2016-08-24 DIAGNOSIS — R7989 Other specified abnormal findings of blood chemistry: Secondary | ICD-10-CM | POA: Diagnosis not present

## 2016-08-24 DIAGNOSIS — Z5111 Encounter for antineoplastic chemotherapy: Secondary | ICD-10-CM | POA: Diagnosis not present

## 2016-08-24 DIAGNOSIS — Z86718 Personal history of other venous thrombosis and embolism: Secondary | ICD-10-CM | POA: Diagnosis not present

## 2016-08-24 DIAGNOSIS — E8581 Light chain (AL) amyloidosis: Secondary | ICD-10-CM | POA: Diagnosis not present

## 2016-08-24 DIAGNOSIS — Z006 Encounter for examination for normal comparison and control in clinical research program: Secondary | ICD-10-CM | POA: Diagnosis not present

## 2016-08-28 DIAGNOSIS — M791 Myalgia: Secondary | ICD-10-CM | POA: Diagnosis not present

## 2016-08-31 ENCOUNTER — Emergency Department (HOSPITAL_COMMUNITY): Payer: Medicare Other

## 2016-08-31 ENCOUNTER — Emergency Department (HOSPITAL_COMMUNITY)
Admission: EM | Admit: 2016-08-31 | Discharge: 2016-09-01 | Disposition: A | Discharge status: Home / Self Care | Payer: Medicare Other | Attending: Emergency Medicine | Admitting: Emergency Medicine

## 2016-08-31 ENCOUNTER — Encounter (HOSPITAL_COMMUNITY): Payer: Self-pay | Admitting: Emergency Medicine

## 2016-08-31 DIAGNOSIS — Z8546 Personal history of malignant neoplasm of prostate: Secondary | ICD-10-CM | POA: Insufficient documentation

## 2016-08-31 DIAGNOSIS — Z79899 Other long term (current) drug therapy: Secondary | ICD-10-CM | POA: Diagnosis not present

## 2016-08-31 DIAGNOSIS — Z87891 Personal history of nicotine dependence: Secondary | ICD-10-CM | POA: Insufficient documentation

## 2016-08-31 DIAGNOSIS — M549 Dorsalgia, unspecified: Secondary | ICD-10-CM | POA: Diagnosis not present

## 2016-08-31 DIAGNOSIS — M7918 Myalgia, other site: Secondary | ICD-10-CM

## 2016-08-31 DIAGNOSIS — M791 Myalgia: Secondary | ICD-10-CM | POA: Diagnosis not present

## 2016-08-31 DIAGNOSIS — R102 Pelvic and perineal pain: Secondary | ICD-10-CM | POA: Diagnosis not present

## 2016-08-31 DIAGNOSIS — M545 Low back pain: Secondary | ICD-10-CM | POA: Diagnosis present

## 2016-08-31 DIAGNOSIS — M25551 Pain in right hip: Secondary | ICD-10-CM | POA: Diagnosis not present

## 2016-08-31 MED ORDER — OXYCODONE-ACETAMINOPHEN 5-325 MG PO TABS
1.0000 | ORAL_TABLET | Freq: Once | ORAL | Status: AC
Start: 2016-08-31 — End: 2016-08-31
  Administered 2016-08-31: 1 via ORAL
  Filled 2016-08-31: qty 1

## 2016-08-31 NOTE — ED Triage Notes (Signed)
Pt is c/o pain in his right lower back/hip area that radiates around  Pt states it started when he was sleeping last Thursday  Pt states he has been taking meloxicam without relief  Pt has had multiple back surgeries

## 2016-08-31 NOTE — ED Provider Notes (Signed)
[facility] DEPT Provider Note   CSN: [id number] Arrival date & time: 08/31/16  1945   By signing my name below, I, Brent Allison, attest that this documentation has been prepared under the direction and in the presence of  [employer]. Electronically Signed: Delton Allison, ED Scribe. 08/31/16. 10:18 PM.   History   Chief Complaint Chief Complaint  Patient presents with  . Back Pain    The history is provided by the patient. No language interpreter was used.   HPI Comments:  Brent Allison is a 70 y.o. male, with a hx of multiple back surgeries, prostate cancer, multiple myeloma, amyloid -- who presents to the Emergency Department complaining of moderate right hip pain x 6 days. Pt notes the pain is non-radiating. He notes standing for extended periods of time exacerbates his pain. Pt notes he has been taking meloxicam with no relief. He also states sitting gives him mild relief. Pt denies weakness, numbness and tingling to BLE. He denies any other complaints at this time. Patient denies warning symptoms of back pain including: fecal incontinence, urinary retention or overflow incontinence, night sweats, waking from sleep with back pain, unexplained fevers or weight loss, h/o cancer, IVDU, recent trauma.     Past Medical History:  Diagnosis Date  . AL amyloidosis 11/04/2012  . Arthritis    lumbar DDD  . Blood dyscrasia    plasma cell dyscrasia with associated amyloidosis  . Complication of anesthesia   . DVT (deep venous thrombosis) ([place])    right  . Dyslipidemia   . H/O multiple myeloma   . Heparin induced thrombocytopenia (HIT) ([place]) 11/04/2012  . Joint pain   . Peripheral vascular disease ([place])    DVT- 01/2012, follows by Dr. [name], lovenox maintained since Spring 2013 , pt. aware that last dose is sch. for 07/22/2012  . Pneumonia    hx of with last time in Nov 2012  . Prostate cancer ([place])    amyloidosis, multiple myeloma   . Sleep apnea    severe with AHI  44.4 events per hour - now on CPAP at 11cm H2O  . Spastic quadriparesis [facility])     Patient Active Problem List   Diagnosis Date Noted  . Thoracic stenosis 06/02/2015  . OSA (obstructive sleep apnea) 12/05/2014  . Portal vein thrombosis 11/14/2012  . Pulmonary embolism ([place]) 11/13/2012  . AL amyloidosis 11/04/2012  . Heparin induced thrombocytopenia (HIT) ([place]) 11/04/2012  . Mesenteric venous thrombosis 10/31/2012  . DVT (deep venous thrombosis) ([place]) 02/02/2012  . Multiple myeloma ([place]) 10/01/2011  . Multiple myeloma ([place]) 10/01/2011  . Neuropathy ([place]) 10/01/2011  . Dyslipidemia     Past Surgical History:  Procedure Laterality Date  . ANTERIOR CERVICAL DECOMP/DISCECTOMY FUSION  02/26/2012   Procedure: ANTERIOR CERVICAL DECOMPRESSION/DISCECTOMY FUSION 2 LEVELS;  Surgeon: Hosie Spangle, MD;  Location: [facility];  Service: Neurosurgery;  Laterality: N/A;  C3-4 C4-5 Anterior cervical decompression/diskectomy, fusion  . BACK SURGERY    . CARDIAC CATHETERIZATION  2010  . CARPAL TUNNEL RELEASE  2009   left  . COLONOSCOPY    . HERNIA REPAIR     around 1985  . ivt     IVC filter placed d/t blood clots, removed- July- 2013  . LIMBAL STEM CELL TRANSPLANT    . LIPOMA EXCISION  around 1985   removed from one of his shoulders  . POSTERIOR CERVICAL FUSION/FORAMINOTOMY  04/06/2012   Procedure: POSTERIOR CERVICAL FUSION/FORAMINOTOMY LEVEL 3;  Surgeon: Herbie Baltimore  Virgie Dad, MD;  Location: [facility];  Service: Neurosurgery;  Laterality: N/A;  C3 and C4 laminectomy with C23 C34 C45 posterior cervical arthrodesis with instrumentation  . POSTERIOR CERVICAL FUSION/FORAMINOTOMY N/A 06/02/2015   Procedure: Cervical Seven, Thoracic One, Thoracic Two Laminectomies with Cervical Seven-Thoracic One, Thoracic One-Thoracic Two Posterior Cervico-Thoracic  Arthrodesis with Instrumentation;  Surgeon: Jovita Gamma, MD;  Location: [facility];  Service: Neurosurgery;  Laterality: N/A;  C7, T1, T2  laminectomies with C7-T2 cerevico thoracic arthrodesis with instrumentation  . POSTERIOR LAMINECTOMY / DECOMPRESSION LUMBAR SPINE  around 2007   L4-5  . PROSTATECTOMY  around 2005  . TONSILLECTOMY     as a child     Home Medications    Prior to Admission medications   Medication Sig Start Date End Date Taking? Authorizing Provider  diphenhydrAMINE (BENADRYL) 25 MG tablet Take 25 mg by mouth every 6 (six) hours as needed. Reported on 02/05/2016    Historical Provider, MD    Family History Family History  Problem Relation Age of Onset  . Pancreatic cancer Mother   . Anesthesia problems Neg Hx   . Hypotension Neg Hx   . Malignant hyperthermia Neg Hx   . Pseudochol deficiency Neg Hx     Social History Social History  Substance Use Topics  . Smoking status: Former Smoker    Quit date: 07/21/1979  . Smokeless tobacco: Never Used     Comment: quit 25+yrs ago  . Alcohol use 1.2 oz/week    2 Cans of beer per week     Comment: occasionally     Allergies   Enoxaparin and Heparin   Review of Systems Review of Systems  Constitutional: Negative for fever and unexpected weight change.  HENT: Negative for rhinorrhea and sore throat.   Eyes: Negative for redness.  Respiratory: Negative for cough.   Cardiovascular: Negative for chest pain.  Gastrointestinal: Negative for abdominal pain, constipation, diarrhea, nausea and vomiting.       Neg for fecal incontinence  Genitourinary: Negative for difficulty urinating, dysuria, flank pain and hematuria.       Negative for urinary incontinence or retention  Musculoskeletal: Positive for arthralgias and back pain. Negative for myalgias.  Skin: Negative for rash.  Neurological: Negative for weakness, numbness and headaches.       Negative for saddle paresthesias      Physical Exam Updated Vital Signs BP 126/67   Pulse 118   Temp 98.3 F (36.8 C)   Resp 16   Ht 6' (1.829 m)   Wt 184 lb (83.5 kg)   SpO2 100%   BMI 24.95 kg/m     Physical Exam  Constitutional: He is oriented to person, place, and time. He appears well-developed and well-nourished. No distress.  HENT:  Head: Normocephalic and atraumatic.  Eyes: Conjunctivae are normal.  Neck: Normal range of motion.  Cardiovascular: Normal rate.   Pulmonary/Chest: Effort normal.  Abdominal: Soft. He exhibits no distension. There is no tenderness. There is no CVA tenderness.  Musculoskeletal: Normal range of motion.       Right hip: He exhibits tenderness.       Left hip: Normal.       Cervical back: Normal.       Thoracic back: Normal.       Lumbar back: He exhibits tenderness. He exhibits normal range of motion and no bony tenderness.       Back:  No step-off noted with palpation of spine.   Neurological:  He is alert and oriented to person, place, and time. He has normal reflexes. No sensory deficit. He exhibits normal muscle tone.  5/5 strength in entire lower extremities bilaterally. No sensation deficit.   Skin: Skin is warm and dry.  Psychiatric: He has a normal mood and affect.  Nursing note and vitals reviewed.    ED Treatments / Results  DIAGNOSTIC STUDIES:  Oxygen Saturation is 100% on RA, normal by my interpretation.    COORDINATION OF CARE:  10:15 PM Will order imaging and administer pain medication. Discussed treatment plan with pt at bedside and pt agreed to plan. Discussed with Dr. Billy Fischer who will see.    Procedures Procedures (including critical care time)  Medications Ordered in ED Medications  oxyCODONE-acetaminophen (PERCOCET/ROXICET) 5-325 MG per tablet 1 tablet (1 tablet Oral Given 08/31/16 2229)  dexamethasone (DECADRON) tablet 10 mg (10 mg Oral Given 09/01/16 0054)   Patient updated on results. Patient given dose of Decadron in emergency department. Will discharge him with pain medication. Encouraged follow-up with his neurosurgeon. He has been up and ambulating in the department.  2:09 AM Patient seen and examined.  Work-up initiated. Medications ordered.   Vital signs reviewed and are as follows: Vitals:   08/31/16 2230 09/01/16 0053  BP: 129/63 124/79  Pulse: 72 61  Resp: 16 16  Temp:      Patient urged to follow-up if pain does not improve with treatment and rest or if pain becomes recurrent. Urged to return with worsening severe pain, loss of bowel or bladder control, trouble walking.   The patient verbalizes understanding and agrees with the plan.  Use pain medication only under direct supervision at the lowest possible dose needed to control your pain.    Initial Impression / Assessment and Plan / ED Course  I have reviewed the triage vital signs and the nursing notes.  Pertinent labs & imaging results that were available during my care of the patient were reviewed by me and considered in my medical decision making (see chart for details).  Clinical Course   Vital signs reviewed and are as follows: Vitals:   08/31/16 2230 09/01/16 0053  BP: 129/63 124/79  Pulse: 72 61  Resp: 16 16  Temp:       Final Clinical Impressions(s) / ED Diagnoses   Final diagnoses:  Pain in pelvis  Musculoskeletal pain   Patient with posterior pelvis pain in area of sciatic notch. CT ordered given extensive neoplastic history. Imaging is negative. No neurological deficits. Patient is ambulatory. No warning symptoms of back pain including: fecal incontinence, urinary retention or overflow incontinence, night sweats, waking from sleep with back pain, unexplained fevers or weight loss, h/o cancer, IVDU, recent trauma. No concern for cauda equina, epidural abscess, or other serious cause of back pain. Conservative measures such as rest, ice/heat and pain medicine indicated with PCP follow-up if no improvement with conservative management.    New Prescriptions Discharge Medication List as of 09/01/2016  1:23 AM    START taking these medications   Details  oxyCODONE-acetaminophen (PERCOCET/ROXICET) 5-325  MG tablet Take 0.5-1 tablets by mouth every 6 (six) hours as needed for severe pain., Starting Wed 09/01/2016, Print      I personally performed the services described in this documentation, which was scribed in my presence. The recorded information has been reviewed and is accurate.     Carlisle Cater, PA-C 09/01/16 0210    Gareth Morgan, MD 09/05/16 1718

## 2016-08-31 NOTE — ED Notes (Signed)
PA at bedside.

## 2016-08-31 NOTE — ED Notes (Signed)
Urine sample in triage

## 2016-09-01 DIAGNOSIS — R102 Pelvic and perineal pain: Secondary | ICD-10-CM | POA: Diagnosis not present

## 2016-09-01 MED ORDER — OXYCODONE-ACETAMINOPHEN 5-325 MG PO TABS: 0.5000 | ORAL_TABLET | Freq: Four times a day (QID) | ORAL | 0 refills | Status: DC | PRN

## 2016-09-01 MED ORDER — DEXAMETHASONE 4 MG PO TABS
10.0000 mg | ORAL_TABLET | Freq: Once | ORAL | Status: AC
  Administered 2016-09-01: 10 mg via ORAL
  Filled 2016-09-01: qty 2

## 2016-09-01 NOTE — ED Notes (Signed)
Pt ambulatory and independent at discharge.  Verbalized understanding of discharge instructions 

## 2016-09-01 NOTE — Discharge Instructions (Signed)
Please read and follow all provided instructions.  Your diagnoses today include:  1. Pain in pelvis   2. Musculoskeletal pain     Tests performed today include:  Vital signs - see below for your results today  CT of your pelvis - no broken bones, cancer metastasis or other acute problems.   Medications prescribed:   Percocet (oxycodone/acetaminophen) - narcotic pain medication  DO NOT drive or perform any activities that require you to be awake and alert because this medicine can make you drowsy. BE VERY CAREFUL not to take multiple medicines containing Tylenol (also called acetaminophen). Doing so can lead to an overdose which can damage your liver and cause liver failure and possibly death.  Use pain medication only under direct supervision at the lowest possible dose needed to control your pain.   Take any prescribed medications only as directed.  Home care instructions:   Follow any educational materials contained in this packet  Please rest, use ice or heat on your back for the next several days  Do not lift, push, pull anything more than 10 pounds for the next week  Follow-up instructions: Please follow-up with your primary care provider in the next 1 week for further evaluation of your symptoms.   Return instructions:  SEEK IMMEDIATE MEDICAL ATTENTION IF YOU HAVE:  New numbness, tingling, weakness, or problem with the use of your arms or legs  Severe back pain not relieved with medications  Loss control of your bowels or bladder  Increasing pain in any areas of the body (such as chest or abdominal pain)  Shortness of breath, dizziness, or fainting.   Worsening nausea (feeling sick to your stomach), vomiting, fever, or sweats  Any other emergent concerns regarding your health   Additional Information:  Your vital signs today were: BP 124/79 (BP Location: Right Arm)    Pulse 61    Temp 98.3 F (36.8 C)    Resp 16    Ht 6' (1.829 m)    Wt 83.5 kg    SpO2 97%     BMI 24.95 kg/m  If your blood pressure (BP) was elevated above 135/85 this visit, please have this repeated by your doctor within one month. --------------

## 2016-09-14 DIAGNOSIS — D7582 Heparin induced thrombocytopenia (HIT): Secondary | ICD-10-CM | POA: Diagnosis not present

## 2016-09-14 DIAGNOSIS — I82409 Acute embolism and thrombosis of unspecified deep veins of unspecified lower extremity: Secondary | ICD-10-CM | POA: Diagnosis not present

## 2016-09-14 DIAGNOSIS — Z6826 Body mass index (BMI) 26.0-26.9, adult: Secondary | ICD-10-CM | POA: Diagnosis not present

## 2016-09-14 DIAGNOSIS — Z9481 Bone marrow transplant status: Secondary | ICD-10-CM | POA: Diagnosis not present

## 2016-09-14 DIAGNOSIS — E8581 Light chain (AL) amyloidosis: Secondary | ICD-10-CM | POA: Diagnosis not present

## 2016-09-16 DIAGNOSIS — M546 Pain in thoracic spine: Secondary | ICD-10-CM | POA: Diagnosis not present

## 2016-09-16 DIAGNOSIS — M5136 Other intervertebral disc degeneration, lumbar region: Secondary | ICD-10-CM | POA: Diagnosis not present

## 2016-09-16 DIAGNOSIS — M549 Dorsalgia, unspecified: Secondary | ICD-10-CM | POA: Diagnosis not present

## 2016-09-16 DIAGNOSIS — M4727 Other spondylosis with radiculopathy, lumbosacral region: Secondary | ICD-10-CM | POA: Diagnosis not present

## 2016-09-16 DIAGNOSIS — M5416 Radiculopathy, lumbar region: Secondary | ICD-10-CM | POA: Diagnosis not present

## 2016-09-21 DIAGNOSIS — I81 Portal vein thrombosis: Secondary | ICD-10-CM | POA: Diagnosis not present

## 2016-09-21 DIAGNOSIS — E041 Nontoxic single thyroid nodule: Secondary | ICD-10-CM | POA: Diagnosis not present

## 2016-09-21 DIAGNOSIS — R918 Other nonspecific abnormal finding of lung field: Secondary | ICD-10-CM | POA: Diagnosis not present

## 2016-09-21 DIAGNOSIS — E8581 Light chain (AL) amyloidosis: Secondary | ICD-10-CM | POA: Diagnosis not present

## 2016-09-21 DIAGNOSIS — R0609 Other forms of dyspnea: Secondary | ICD-10-CM | POA: Diagnosis not present

## 2016-09-21 DIAGNOSIS — E8589 Other amyloidosis: Secondary | ICD-10-CM | POA: Diagnosis not present

## 2016-09-21 DIAGNOSIS — Z006 Encounter for examination for normal comparison and control in clinical research program: Secondary | ICD-10-CM | POA: Diagnosis not present

## 2016-09-30 DIAGNOSIS — C44311 Basal cell carcinoma of skin of nose: Secondary | ICD-10-CM | POA: Diagnosis not present

## 2016-09-30 DIAGNOSIS — D225 Melanocytic nevi of trunk: Secondary | ICD-10-CM | POA: Diagnosis not present

## 2016-09-30 DIAGNOSIS — L821 Other seborrheic keratosis: Secondary | ICD-10-CM | POA: Diagnosis not present

## 2016-09-30 DIAGNOSIS — D485 Neoplasm of uncertain behavior of skin: Secondary | ICD-10-CM | POA: Diagnosis not present

## 2016-09-30 DIAGNOSIS — C44519 Basal cell carcinoma of skin of other part of trunk: Secondary | ICD-10-CM | POA: Diagnosis not present

## 2016-09-30 DIAGNOSIS — Z85828 Personal history of other malignant neoplasm of skin: Secondary | ICD-10-CM | POA: Diagnosis not present

## 2016-10-19 DIAGNOSIS — R918 Other nonspecific abnormal finding of lung field: Secondary | ICD-10-CM | POA: Diagnosis not present

## 2016-10-19 DIAGNOSIS — R0609 Other forms of dyspnea: Secondary | ICD-10-CM | POA: Diagnosis not present

## 2016-10-19 DIAGNOSIS — R931 Abnormal findings on diagnostic imaging of heart and coronary circulation: Secondary | ICD-10-CM | POA: Diagnosis not present

## 2016-10-19 DIAGNOSIS — E041 Nontoxic single thyroid nodule: Secondary | ICD-10-CM | POA: Diagnosis not present

## 2016-10-19 DIAGNOSIS — Z862 Personal history of diseases of the blood and blood-forming organs and certain disorders involving the immune mechanism: Secondary | ICD-10-CM | POA: Diagnosis not present

## 2016-10-19 DIAGNOSIS — I340 Nonrheumatic mitral (valve) insufficiency: Secondary | ICD-10-CM | POA: Diagnosis not present

## 2016-10-19 DIAGNOSIS — E8581 Light chain (AL) amyloidosis: Secondary | ICD-10-CM | POA: Diagnosis not present

## 2016-10-19 DIAGNOSIS — Z86718 Personal history of other venous thrombosis and embolism: Secondary | ICD-10-CM | POA: Diagnosis not present

## 2016-10-19 DIAGNOSIS — I371 Nonrheumatic pulmonary valve insufficiency: Secondary | ICD-10-CM | POA: Diagnosis not present

## 2016-10-19 DIAGNOSIS — I517 Cardiomegaly: Secondary | ICD-10-CM | POA: Diagnosis not present

## 2016-10-19 DIAGNOSIS — Z006 Encounter for examination for normal comparison and control in clinical research program: Secondary | ICD-10-CM | POA: Diagnosis not present

## 2016-11-01 DIAGNOSIS — C44311 Basal cell carcinoma of skin of nose: Secondary | ICD-10-CM | POA: Diagnosis not present

## 2016-11-01 DIAGNOSIS — Z85828 Personal history of other malignant neoplasm of skin: Secondary | ICD-10-CM | POA: Diagnosis not present

## 2016-11-02 DIAGNOSIS — R030 Elevated blood-pressure reading, without diagnosis of hypertension: Secondary | ICD-10-CM | POA: Diagnosis not present

## 2016-11-02 DIAGNOSIS — Z6827 Body mass index (BMI) 27.0-27.9, adult: Secondary | ICD-10-CM | POA: Diagnosis not present

## 2016-11-02 DIAGNOSIS — M5416 Radiculopathy, lumbar region: Secondary | ICD-10-CM | POA: Diagnosis not present

## 2016-11-02 DIAGNOSIS — M4727 Other spondylosis with radiculopathy, lumbosacral region: Secondary | ICD-10-CM | POA: Diagnosis not present

## 2016-11-02 DIAGNOSIS — M5136 Other intervertebral disc degeneration, lumbar region: Secondary | ICD-10-CM | POA: Diagnosis not present

## 2016-11-18 ENCOUNTER — Other Ambulatory Visit: Payer: Self-pay | Admitting: Nurse Practitioner

## 2016-11-23 DIAGNOSIS — I43 Cardiomyopathy in diseases classified elsewhere: Secondary | ICD-10-CM | POA: Diagnosis not present

## 2016-11-23 DIAGNOSIS — Z006 Encounter for examination for normal comparison and control in clinical research program: Secondary | ICD-10-CM | POA: Diagnosis not present

## 2016-11-23 DIAGNOSIS — R918 Other nonspecific abnormal finding of lung field: Secondary | ICD-10-CM | POA: Diagnosis not present

## 2016-11-23 DIAGNOSIS — Z86718 Personal history of other venous thrombosis and embolism: Secondary | ICD-10-CM | POA: Diagnosis not present

## 2016-11-23 DIAGNOSIS — Z789 Other specified health status: Secondary | ICD-10-CM | POA: Diagnosis not present

## 2016-11-23 DIAGNOSIS — E854 Organ-limited amyloidosis: Secondary | ICD-10-CM | POA: Diagnosis not present

## 2016-11-23 DIAGNOSIS — E8581 Light chain (AL) amyloidosis: Secondary | ICD-10-CM | POA: Diagnosis not present

## 2016-11-23 DIAGNOSIS — Z79899 Other long term (current) drug therapy: Secondary | ICD-10-CM | POA: Diagnosis not present

## 2016-12-03 ENCOUNTER — Ambulatory Visit (HOSPITAL_BASED_OUTPATIENT_CLINIC_OR_DEPARTMENT_OTHER): Payer: Medicare Other | Admitting: Oncology

## 2016-12-03 ENCOUNTER — Telehealth: Payer: Self-pay | Admitting: Oncology

## 2016-12-03 VITALS — BP 103/63 | HR 65 | Temp 98.2°F | Resp 18 | Wt 200.4 lb

## 2016-12-03 DIAGNOSIS — E8589 Other amyloidosis: Secondary | ICD-10-CM | POA: Diagnosis not present

## 2016-12-03 DIAGNOSIS — E8581 Light chain (AL) amyloidosis: Secondary | ICD-10-CM

## 2016-12-03 NOTE — Telephone Encounter (Signed)
Appointments scheduled per 11/2 LOS. Patient given AVS report and calendars with future scheduled appointments °

## 2016-12-03 NOTE — Progress Notes (Signed)
[place] OFFICE PROGRESS NOTE   Diagnosis: Multiple myeloma  INTERVAL HISTORY:   Brent Allison returns as scheduled. He feels well. No bleeding or symptom of thrombosis. He is followed closely at [facility] while on the Pronto study. The lambda light chains have remained stable and an echocardiogram is stable. He has completed plan course of treatment. He plans to enroll on an extension phase.  He saw Brent Allison in October. He recommends continuing the extension phase of the study and holding chemotherapy.    Objective:  Vital signs in last 24 hours:  Blood pressure 103/63, pulse 65, temperature 98.2 F (36.8 C), temperature source Oral, resp. rate 18, weight 200 lb 6.4 oz (90.9 kg), SpO2 99 %.    HEENT: Mild macroglossia-appears improved Resp: Lungs clear bilaterally Cardio: Regular rate and rhythm GI: No hepatosplenomegaly Vascular: Trace edema at the right ankle  Skin: Mild bruising around the eyes    Lab Results: 11/23/2016 at [facility]: Heme 1113.7, white count 6.7, platelets 183,000  Creatinine 1.3, BUN 28, albumin 3.5, total protein 6.5  Lambda free light chains 4.22   Medications: I have reviewed the patient's current medications.  Assessment/Plan: 1. 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 2. "Bruising" at the eyelids and mouth: Likely related to amyloidosis, persistent. 3. Numbness and loss of vibratory sense at the fingertips: This predated Velcade-based therapy, but worsened. Now much improved following cervical spine surgery. 4. Elevated serum free lambda light chains. The lambda light chains were lower on November 16 and slightly higher on 12/14/2011. 5. Lambda light chain proteinuria. 6. Bone marrow plasmacytosis: Variable increase in plasma cells noted on the bone marrow biopsy 07/29/2011 with plasma cells estimated to represent between 4% and 20% of the cellular population. 7. Remote history of prostate cancer. 8. Sleep  apnea. 9. Dyslipidemia. 10. Report of pneumonia on 2 occasions in 2011. 11. Admission to a hospital in [place], [place] October 2012 with "pneumonia." A chest x-ray at [facility] on November 2 was negative .  12. Low serum immunoglobulin G level. 13. Plasma cell dyscrasia with associated amyloidosis.  1. Initiation of systemic therapy with Cytoxan, Velcade, and Decadron 08/12/2011. Cycle #2 was initiated on 09/16/2011. Cycle #3 was initiated on 10/15/2011. Velcade was placed on hold due to neuropathy. 2. The serum free lambda light chains were decreased on October 08 2011. 3. The serum free lambda light chains were slightly increased on 12/14/2011, lower on 12/29/2011 and 02/02/2012. 4. Initiation of Revlimid/Decadron February 2013, cycle 2 started on 01/28/2012. 5. High-dose melphalan chemotherapy followed by autologous stem cell infusion on 02/09/2013 6. Initiation of Velcade/Decadrontherapy 05/28/2013, he completed 2 cycles on a day 1, 4, 8, 11 schedule 7. Improvement in the serum free lambda light chains following induction Velcade/Decadron  8. Initiation of weekly Velcade/Decadron on 07/16/2013, last treatment on 10/16/2013 9. Improvement in the serum free lambda light chains on 08/21/2013 and 09/18/2013 ([facility]) 10. Normal serum free lambda light chains on 12/13/2013 11. Mild increase of the serum free lambda light chains beginning January 2016-stable 12. treatment on the Pronto study at [place] beginning 12/17/2015, and of study 11/23/2016 15. Loss of "balance "and proximal motor weakness secondary to cervical stenosis-status post decompression surgery on 02/19/2012. Improved, but not resolved stenosis with mass effect on the spinal cord noted on a repeat MRI 03/28/2012. He underwent further decompression surgery on 04/06/2012. The ataxia, weakness, and peripheral numbness is much improved.  16. Right lower Extremity deep vein thrombosis 02/02/2012-a Doppler ultrasound confirmed a  gastrocnemius  and peroneal deep vein thrombosis. An IVC filter was placed prior to surgery and he was maintained on Lovenox. The IVC filter was removed on 05/04/2012.  17. Anorexia following cervical spine surgery. Improved.  18. Episode of flank pain and hematuria in 03/31/2012-etiology unclear. He completed a course of antibiotics. The hematuria and pain have resolved.  19. Rectal wall thickening noted on a CT of the pelvis 03/31/2012.  20. colonoscopy showed an area of abnormality at the rectum with biopsy positive for amyloid.  21. Lung nodule noted on the CT 03/31/2012 , no lung nodule on a chest CT 07/30/2013  22. Lumbar stenosis-status post surgery on 07/31/2012.  23. Status post stem cell collection.  24.Rright internal jugular thrombosis, 10/22/2012, likely related to pheresis catheter and HIT  25. Admission with acute right pulmonary embolism 10/27/2012  26. Superior mesenteric vein/portal vein thrombosis diagnosed on a CT of the abdomen 10/28/2012 and 10/29/2012-treated with Argatroban , maintained on Coumadin anticoagulation until he hospital admission March 2014 for stem cell therapy  27. HIT confirmed on a serotonin release assay 10/29/2012  28. History of Renal insufficiency during the December 2013 hospital admission-likely related to dehydration and? Contrast nephropathy , persistent mild elevation of the creatinine  29. Dysphagia-? Secondary to amyloidosis, reported when he was here on 05/08/2013. He did not complain of dysphagia today  30. Anemia secondary to multiple myeloma and chemotherapy , improved  31.right lower Extremity deep vein thrombosis diagnosed at [facility] July 2014 , repeat Doppler at [facility] 07/19/2013 with chronic thrombus in the right gastrocnemius, Eliquis has been discontinued  32. Fall August 2014 with a left shoulder injury, diagnosed with a rotator cuff tear, status post surgical repair 09/19/2013  33. Peripheral neuropathy-progressive, likely  secondary to Velcade. The neuropathy symptoms have partially improved since Velcade was discontinued after treatment 10/16/2013  34. Cardiac MRI 05/22/2015-normal left ventricular size with severe concentric hypertrophy and mildly impaired systolic function, DXAJ-28%, right ventricular hypertrophy, diffuse late gadolinium enhancement in the left and right ventricles-findings consistent with cardiac amyloidosis. Echocardiogram at [facility]-LVEF 50% 35. Multinodular goiter confirmed on thyroid ultrasound at [facility] 06/29/2016, amyloidosis of the thyroid confirmed at [place]      Disposition: Brent Allison has systemic amyloidosis. He completed treatment on a study at [facility] and his cardiac function Remains stable. The macroglossia appears partially improved. The serum light chains are stable and he is not anemic. There is no clear indication for resuming systemic chemotherapy at present. He plans to enroll on the extension phase of the Pronto study at [facility].  He continues follow-up with Brent Allison. He will return for an office visit here in 4 months.  Betsy Coder, MD  12/03/2016  10:05 AM

## 2016-12-21 DIAGNOSIS — E8581 Light chain (AL) amyloidosis: Secondary | ICD-10-CM | POA: Diagnosis not present

## 2016-12-21 DIAGNOSIS — Z006 Encounter for examination for normal comparison and control in clinical research program: Secondary | ICD-10-CM | POA: Diagnosis not present

## 2016-12-21 DIAGNOSIS — R918 Other nonspecific abnormal finding of lung field: Secondary | ICD-10-CM | POA: Diagnosis not present

## 2016-12-31 DIAGNOSIS — N529 Male erectile dysfunction, unspecified: Secondary | ICD-10-CM | POA: Diagnosis not present

## 2016-12-31 DIAGNOSIS — Z8546 Personal history of malignant neoplasm of prostate: Secondary | ICD-10-CM | POA: Diagnosis not present

## 2017-01-11 DIAGNOSIS — E854 Organ-limited amyloidosis: Secondary | ICD-10-CM | POA: Diagnosis not present

## 2017-01-11 DIAGNOSIS — Z79899 Other long term (current) drug therapy: Secondary | ICD-10-CM | POA: Diagnosis not present

## 2017-01-11 DIAGNOSIS — E8589 Other amyloidosis: Secondary | ICD-10-CM | POA: Diagnosis not present

## 2017-01-11 DIAGNOSIS — D7582 Heparin induced thrombocytopenia (HIT): Secondary | ICD-10-CM | POA: Diagnosis not present

## 2017-01-11 DIAGNOSIS — Z006 Encounter for examination for normal comparison and control in clinical research program: Secondary | ICD-10-CM | POA: Diagnosis not present

## 2017-01-11 DIAGNOSIS — E8581 Light chain (AL) amyloidosis: Secondary | ICD-10-CM | POA: Diagnosis not present

## 2017-01-11 DIAGNOSIS — E041 Nontoxic single thyroid nodule: Secondary | ICD-10-CM | POA: Diagnosis not present

## 2017-01-11 DIAGNOSIS — Z86718 Personal history of other venous thrombosis and embolism: Secondary | ICD-10-CM | POA: Diagnosis not present

## 2017-01-11 DIAGNOSIS — R0609 Other forms of dyspnea: Secondary | ICD-10-CM | POA: Diagnosis not present

## 2017-01-11 DIAGNOSIS — R918 Other nonspecific abnormal finding of lung field: Secondary | ICD-10-CM | POA: Diagnosis not present

## 2017-01-11 DIAGNOSIS — T45515D Adverse effect of anticoagulants, subsequent encounter: Secondary | ICD-10-CM | POA: Diagnosis not present

## 2017-01-11 DIAGNOSIS — I43 Cardiomyopathy in diseases classified elsewhere: Secondary | ICD-10-CM | POA: Diagnosis not present

## 2017-01-18 DIAGNOSIS — Z9481 Bone marrow transplant status: Secondary | ICD-10-CM | POA: Diagnosis not present

## 2017-01-18 DIAGNOSIS — Z6828 Body mass index (BMI) 28.0-28.9, adult: Secondary | ICD-10-CM | POA: Diagnosis not present

## 2017-01-18 DIAGNOSIS — D7582 Heparin induced thrombocytopenia (HIT): Secondary | ICD-10-CM | POA: Diagnosis not present

## 2017-01-18 DIAGNOSIS — E8581 Light chain (AL) amyloidosis: Secondary | ICD-10-CM | POA: Diagnosis not present

## 2017-02-08 DIAGNOSIS — E8581 Light chain (AL) amyloidosis: Secondary | ICD-10-CM | POA: Diagnosis not present

## 2017-02-08 DIAGNOSIS — E854 Organ-limited amyloidosis: Secondary | ICD-10-CM | POA: Diagnosis not present

## 2017-02-08 DIAGNOSIS — I43 Cardiomyopathy in diseases classified elsewhere: Secondary | ICD-10-CM | POA: Diagnosis not present

## 2017-02-08 DIAGNOSIS — Z006 Encounter for examination for normal comparison and control in clinical research program: Secondary | ICD-10-CM | POA: Diagnosis not present

## 2017-02-08 DIAGNOSIS — Z862 Personal history of diseases of the blood and blood-forming organs and certain disorders involving the immune mechanism: Secondary | ICD-10-CM | POA: Diagnosis not present

## 2017-02-08 DIAGNOSIS — R5382 Chronic fatigue, unspecified: Secondary | ICD-10-CM | POA: Diagnosis not present

## 2017-02-08 DIAGNOSIS — R918 Other nonspecific abnormal finding of lung field: Secondary | ICD-10-CM | POA: Diagnosis not present

## 2017-03-04 DIAGNOSIS — J069 Acute upper respiratory infection, unspecified: Secondary | ICD-10-CM | POA: Diagnosis not present

## 2017-03-08 DIAGNOSIS — I81 Portal vein thrombosis: Secondary | ICD-10-CM | POA: Diagnosis not present

## 2017-03-08 DIAGNOSIS — E8581 Light chain (AL) amyloidosis: Secondary | ICD-10-CM | POA: Diagnosis not present

## 2017-03-08 DIAGNOSIS — R5383 Other fatigue: Secondary | ICD-10-CM | POA: Diagnosis not present

## 2017-03-08 DIAGNOSIS — R918 Other nonspecific abnormal finding of lung field: Secondary | ICD-10-CM | POA: Diagnosis not present

## 2017-03-08 DIAGNOSIS — Z006 Encounter for examination for normal comparison and control in clinical research program: Secondary | ICD-10-CM | POA: Diagnosis not present

## 2017-04-04 ENCOUNTER — Telehealth: Payer: Self-pay | Admitting: Oncology

## 2017-04-04 ENCOUNTER — Ambulatory Visit (HOSPITAL_BASED_OUTPATIENT_CLINIC_OR_DEPARTMENT_OTHER): Payer: Medicare Other | Admitting: Oncology

## 2017-04-04 ENCOUNTER — Ambulatory Visit: Payer: Medicare Other | Admitting: Nurse Practitioner

## 2017-04-04 VITALS — BP 116/68 | HR 57 | Temp 97.0°F | Resp 16 | Ht 72.0 in | Wt 203.7 lb

## 2017-04-04 DIAGNOSIS — C9000 Multiple myeloma not having achieved remission: Secondary | ICD-10-CM

## 2017-04-04 DIAGNOSIS — E8589 Other amyloidosis: Secondary | ICD-10-CM

## 2017-04-04 DIAGNOSIS — E8581 Light chain (AL) amyloidosis: Secondary | ICD-10-CM

## 2017-04-04 DIAGNOSIS — Z86718 Personal history of other venous thrombosis and embolism: Secondary | ICD-10-CM

## 2017-04-04 NOTE — Telephone Encounter (Signed)
Gave patient AVS and calender per 5/14 los . f/u and flu shot in five months.

## 2017-04-04 NOTE — Progress Notes (Signed)
[place] OFFICE PROGRESS NOTE   Diagnosis: Amyloidosis  INTERVAL HISTORY:   Brent Allison returns as scheduled. He continues treatment on study at [facility]. He is seen at [facility]. He bruises easily. He has mild exertional dyspnea. No other complaint.   Objective:  Vital signs in last 24 hours:  Blood pressure 116/68, pulse (!) 57, temperature 97 F (36.1 C), temperature source Oral, resp. rate 16, height 6' (1.829 m), weight 203 lb 11.2 oz (92.4 kg), SpO2 100 %.    HEENT:  macroglossia, no bleeding Resp:  lungs clear bilaterally Cardio:  regular rate and rhythm GI:  no hepatosplenomegaly Vascular:  trace edema at the right lower leg and ankle  Skin Small ecchymoses over the trunk, periorbital ecchymoses    Medications: I have reviewed the patient's current medications.  Assessment/Plan: 1. 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 2. "Bruising" at the eyelids and mouth: Likely related to amyloidosis, persistent. 3. Numbness and loss of vibratory sense at the fingertips: This predated Velcade-based therapy, but worsened. Now much improved following cervical spine surgery. 4. Elevated serum free lambda light chains. The lambda light chains were lower on November 16 and slightly higher on 12/14/2011. 5. Lambda light chain proteinuria. 6. Bone marrow plasmacytosis: Variable increase in plasma cells noted on the bone marrow biopsy 07/29/2011 with plasma cells estimated to represent between 4% and 20% of the cellular population. 7. Remote history of prostate cancer. 8. Sleep apnea. 9. Dyslipidemia. 10. Report of pneumonia on 2 occasions in 2011. 11. Admission to a hospital in [place], [place] October 2012 with "pneumonia." A chest x-ray at [facility] on November 2 was negative .  12. Low serum immunoglobulin G level. 13. Plasma cell dyscrasia with associated amyloidosis.  1. Initiation of systemic therapy with Cytoxan, Velcade, and Decadron  08/12/2011. Cycle #2 was initiated on 09/16/2011. Cycle #3 was initiated on 10/15/2011. Velcade was placed on hold due to neuropathy. 2. The serum free lambda light chains were decreased on October 08 2011. 3. The serum free lambda light chains were slightly increased on 12/14/2011, lower on 12/29/2011 and 02/02/2012. 4. Initiation of Revlimid/Decadron February 2013, cycle 2 started on 01/28/2012. 5. High-dose melphalan chemotherapy followed by autologous stem cell infusion on 02/09/2013 6. Initiation of Velcade/Decadrontherapy 05/28/2013, he completed 2 cycles on a day 1, 4, 8, 11 schedule 7. Improvement in the serum free lambda light chains following induction Velcade/Decadron  8. Initiation of weekly Velcade/Decadron on 07/16/2013, last treatment on 10/16/2013 9. Improvement in the serum free lambda light chains on 08/21/2013 and 09/18/2013 ([facility]) 10. Normal serum free lambda light chains on 12/13/2013 11. Mild increase of the serum free lambda light chains beginning January 2016-stable 12. treatment on the Pronto study at [place] beginning 12/17/2015, in  of study 11/23/2016  Started on phase 2b Prothena study-long-term safety/efficacy study 01/11/2017  15. Loss of "balance "and proximal motor weakness secondary to cervical stenosis-status post decompression surgery on 02/19/2012. Improved, but not resolved stenosis with mass effect on the spinal cord noted on a repeat MRI 03/28/2012. He underwent further decompression surgery on 04/06/2012. The ataxia, weakness, and peripheral numbness is much improved.  16. Right lower Extremity deep vein thrombosis 02/02/2012-a Doppler ultrasound confirmed a gastrocnemius and peroneal deep vein thrombosis. An IVC filter was placed prior to surgery and he was maintained on Lovenox. The IVC filter was removed on 05/04/2012.  17. Anorexia following cervical spine surgery. Improved.  18. Episode of flank pain and hematuria in 03/31/2012-etiology unclear. He  completed  a course of antibiotics. The hematuria and pain have resolved.  19. Rectal wall thickening noted on a CT of the pelvis 03/31/2012.  20. colonoscopy showed an area of abnormality at the rectum with biopsy positive for amyloid.  21. Lung nodule noted on the CT 03/31/2012 , no lung nodule on a chest CT 07/30/2013  22. Lumbar stenosis-status post surgery on 07/31/2012.  23. Status post stem cell collection.  24.Rright internal jugular thrombosis, 10/22/2012, likely related to pheresis catheter and HIT  25. Admission with acute right pulmonary embolism 10/27/2012  26. Superior mesenteric vein/portal vein thrombosis diagnosed on a CT of the abdomen 10/28/2012 and 10/29/2012-treated with Argatroban , maintained on Coumadin anticoagulation until he hospital admission March 2014 for stem cell therapy  27. HIT confirmed on a serotonin release assay 10/29/2012  28. History of Renal insufficiency during the December 2013 hospital admission-likely related to dehydration and? Contrast nephropathy , persistent mild elevation of the creatinine  29. Dysphagia-? Secondary to amyloidosis, reported when he was here on 05/08/2013. He did not complain of dysphagia today  30. Anemia secondary to multiple myeloma and chemotherapy , improved  31.right lower Extremity deep vein thrombosis diagnosed at [facility] July 2014 , repeat Doppler at [facility] 07/19/2013 with chronic thrombus in the right gastrocnemius, Eliquis has been discontinued  32. Fall August 2014 with a left shoulder injury, diagnosed with a rotator cuff tear, status post surgical repair 09/19/2013  33. 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 The neuropathy symptoms have partially improved since Velcade was discontinued after treatment 10/16/2013  34. Cardiac MRI 05/22/2015-normal left ventricular size with severe concentric hypertrophy and mildly impaired systolic function, ITGP-49%, right ventricular hypertrophy, diffuse  late gadolinium enhancement in the left and right ventricles-findings consistent with cardiac amyloidosis. Echocardiogram at [facility]-LVEF 50% 35. Multinodular goiter confirmed on thyroid ultrasound at [facility] 06/29/2016, amyloidosis of the thyroid confirmed at [facility]   Disposition:   Brent Allison appears stable. He continues the study drug for cardiac amyloidosis at [facility]. He remains off of systemic therapy for multiple myeloma/amyloidosis. He is followed by Dr. Amalia Hailey. The serum free lambda light chains have not changed significantly and he is stable from a hematologic standpoint.  He will return for an office visit and influenza vaccine in October. We are available to see him in the interim as needed.  15 minutes were spent with the patient today. The majority of the time was used for counseling and coordination of care.  Betsy Coder, MD  04/04/2017  10:23 AM

## 2017-04-05 DIAGNOSIS — E854 Organ-limited amyloidosis: Secondary | ICD-10-CM | POA: Diagnosis not present

## 2017-04-05 DIAGNOSIS — R911 Solitary pulmonary nodule: Secondary | ICD-10-CM | POA: Diagnosis not present

## 2017-04-05 DIAGNOSIS — Z862 Personal history of diseases of the blood and blood-forming organs and certain disorders involving the immune mechanism: Secondary | ICD-10-CM | POA: Diagnosis not present

## 2017-04-05 DIAGNOSIS — I43 Cardiomyopathy in diseases classified elsewhere: Secondary | ICD-10-CM | POA: Diagnosis not present

## 2017-04-05 DIAGNOSIS — E8581 Light chain (AL) amyloidosis: Secondary | ICD-10-CM | POA: Diagnosis not present

## 2017-04-05 DIAGNOSIS — Z006 Encounter for examination for normal comparison and control in clinical research program: Secondary | ICD-10-CM | POA: Diagnosis not present

## 2017-04-06 ENCOUNTER — Telehealth: Payer: Self-pay | Admitting: *Deleted

## 2017-04-06 NOTE — Telephone Encounter (Signed)
Returned call to pt, he reports [facility] trial was discontinued abruptly due to their finding "no clear benefit". He was expecting to continue with the drug for 36 months.  He has no follow up scheduled there with Brent Allison. Pt is unsure of the next steps from here. Sees Brent Allison @ [facility] late August.  Message to MD.

## 2017-04-06 NOTE — Telephone Encounter (Addendum)
Returned call to pt, will see pt after [place] visit, per Dr. [name]. Pt reports he will now see Dr. Amalia Hailey late June, requests mid-July visit with Dr. [name]. Message to schedulers.

## 2017-05-17 DIAGNOSIS — R5383 Other fatigue: Secondary | ICD-10-CM | POA: Diagnosis not present

## 2017-05-17 DIAGNOSIS — Z9481 Bone marrow transplant status: Secondary | ICD-10-CM | POA: Diagnosis not present

## 2017-05-17 DIAGNOSIS — Z79899 Other long term (current) drug therapy: Secondary | ICD-10-CM | POA: Diagnosis not present

## 2017-05-17 DIAGNOSIS — E8581 Light chain (AL) amyloidosis: Secondary | ICD-10-CM | POA: Diagnosis not present

## 2017-05-17 DIAGNOSIS — Z6827 Body mass index (BMI) 27.0-27.9, adult: Secondary | ICD-10-CM | POA: Diagnosis not present

## 2017-05-17 DIAGNOSIS — R0609 Other forms of dyspnea: Secondary | ICD-10-CM | POA: Diagnosis not present

## 2017-05-17 DIAGNOSIS — Z888 Allergy status to other drugs, medicaments and biological substances status: Secondary | ICD-10-CM | POA: Diagnosis not present

## 2017-05-17 DIAGNOSIS — C9000 Multiple myeloma not having achieved remission: Secondary | ICD-10-CM | POA: Diagnosis not present

## 2017-05-17 DIAGNOSIS — I5032 Chronic diastolic (congestive) heart failure: Secondary | ICD-10-CM | POA: Diagnosis not present

## 2017-05-17 DIAGNOSIS — I2699 Other pulmonary embolism without acute cor pulmonale: Secondary | ICD-10-CM | POA: Diagnosis not present

## 2017-06-01 DIAGNOSIS — I519 Heart disease, unspecified: Secondary | ICD-10-CM | POA: Diagnosis not present

## 2017-06-01 DIAGNOSIS — I517 Cardiomegaly: Secondary | ICD-10-CM | POA: Diagnosis not present

## 2017-06-01 DIAGNOSIS — I340 Nonrheumatic mitral (valve) insufficiency: Secondary | ICD-10-CM | POA: Diagnosis not present

## 2017-06-01 DIAGNOSIS — E8581 Light chain (AL) amyloidosis: Secondary | ICD-10-CM | POA: Diagnosis not present

## 2017-06-02 ENCOUNTER — Telehealth: Payer: Self-pay | Admitting: Oncology

## 2017-06-02 ENCOUNTER — Other Ambulatory Visit: Payer: Self-pay | Admitting: *Deleted

## 2017-06-02 ENCOUNTER — Ambulatory Visit (HOSPITAL_BASED_OUTPATIENT_CLINIC_OR_DEPARTMENT_OTHER): Payer: Medicare Other | Admitting: Oncology

## 2017-06-02 VITALS — BP 123/67 | HR 54 | Temp 98.4°F | Resp 18 | Ht 72.0 in | Wt 209.6 lb

## 2017-06-02 DIAGNOSIS — Z8546 Personal history of malignant neoplasm of prostate: Secondary | ICD-10-CM | POA: Diagnosis not present

## 2017-06-02 DIAGNOSIS — Z86718 Personal history of other venous thrombosis and embolism: Secondary | ICD-10-CM

## 2017-06-02 DIAGNOSIS — E8581 Light chain (AL) amyloidosis: Secondary | ICD-10-CM

## 2017-06-02 NOTE — Telephone Encounter (Signed)
Scheduled appt per 7/12 los - Gave patient AVS and calender per los.  

## 2017-06-02 NOTE — Progress Notes (Signed)
[place] OFFICE PROGRESS NOTE   Diagnosis: Amyloidosis  INTERVAL HISTORY:   Brent Allison returns as scheduled. He feels well. He has fatigue late in the day. He saw Brent Allison 05/17/2017. A serum immunofixation confirmed a low level monoclonal IgG kappa protein. The kappa and lambda free light chains returned mildly elevated The BNP returned at 407. He had an echocardiogram at [facility] yesterday.  He was started on doxycycline. He takes Voltaren occasionally for back pain.   Objective:  Vital signs in last 24 hours:  Blood pressure 123/67, pulse (!) 54, temperature 98.4 F (36.9 C), temperature source Oral, resp. rate 18, height 6' (1.829 m), weight 209 lb 9.6 oz (95.1 kg), SpO2 100 %.    HEENT: Macroglossia, no oral bleeding. Small ecchymoses surrounding eyes  Resp: Lungs clear bilaterally  Cardio: Regular rate and rhythm  GI: No hepatosplenomegaly, nontender  Vascular: Trace edema at the right greater than left lower leg.   Skin:Ecchymosis at the upper abdominal wall     Lab Results: [facility] 05/17/2017: Hemolymph 14.2, platelets 169,000, WBC 6.4, creatinine 1.22, BUN 24, calcium 9.0   Medications: I have reviewed the patient's current medications.  Assessment/Plan: 1. 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 2. "Bruising" at the eyelids and mouth: Likely related to amyloidosis, persistent. 3. Numbness and loss of vibratory sense at the fingertips: This predated Velcade-based therapy, but worsened. Now much improved following cervical spine surgery. 4. Elevated serum free lambda light chains. The lambda light chains were lower on November 16 and slightly higher on 12/14/2011. 5. Lambda light chain proteinuria. 6. Bone marrow plasmacytosis: Variable increase in plasma cells noted on the bone marrow biopsy 07/29/2011 with plasma cells estimated to represent between 4% and 20% of the cellular population. 7. Remote history of prostate cancer. 8. Sleep  apnea. 9. Dyslipidemia. 10. Report of pneumonia on 2 occasions in 2011. 11. Admission to a hospital in [place], [place] October 2012 with "pneumonia." A [facility] on November 2 was negative .  12. Low serum immunoglobulin G level. 13. Plasma cell dyscrasia with associated amyloidosis.  1. Initiation of systemic therapy with Cytoxan, Velcade, and Decadron 08/12/2011. Cycle #2 was initiated on 09/16/2011. Cycle #3 was initiated on 10/15/2011. Velcade was placed on hold due to neuropathy. 2. The serum free lambda light chains were decreased on October 08 2011. 3. The serum free lambda light chains were slightly increased on 12/14/2011, lower on 12/29/2011 and 02/02/2012. 4. Initiation of Revlimid/Decadron February 2013, cycle 2 started on 01/28/2012. 5. High-dose melphalan chemotherapy followed by autologous stem cell infusion on 02/09/2013 6. Initiation of Velcade/Decadrontherapy 05/28/2013, he completed 2 cycles on a day 1, 4, 8, 11 schedule 7. Improvement in the serum free lambda light chains following induction Velcade/Decadron  8. Initiation of weekly Velcade/Decadron on 07/16/2013, last treatment on 10/16/2013 9. Improvement in the serum free lambda light chains on 08/21/2013 and 09/18/2013 ([facility]) 10. Normal serum free lambda light chains on 12/13/2013 11. Mild increase of the serum free lambda light chains beginning January 2016-stable 12. treatment on the Pronto study at [place] beginning 12/17/2015, in  of study 11/23/2016  Started on phase 2b Prothena study-long-term safety/efficacy study 01/11/2017 [facility] discontinued May 2018  15. Loss of "balance "and proximal motor weakness secondary to cervical stenosis-status post decompression surgery on 02/19/2012. Improved, but not resolved stenosis with mass effect on the spinal cord noted on a repeat MRI 03/28/2012. He underwent further decompression surgery on 04/06/2012. The ataxia, weakness, and peripheral  numbness is  much improved.  16. Right lower Extremity deep vein thrombosis 02/02/2012-a Doppler ultrasound confirmed a gastrocnemius and peroneal deep vein thrombosis. An IVC filter was placed prior to surgery and he was maintained on Lovenox. The IVC filter was removed on 05/04/2012.  17. Anorexia following cervical spine surgery. Improved.  18. Episode of flank pain and hematuria in 03/31/2012-etiology unclear. He completed a course of antibiotics. The hematuria and pain have resolved.  19. Rectal wall thickening noted on a CT of the pelvis 03/31/2012.  20. colonoscopy showed an area of abnormality at the rectum with biopsy positive for amyloid.  21. Lung nodule noted on the CT 03/31/2012 , no lung nodule on a chest CT 07/30/2013  22. Lumbar stenosis-status post surgery on 07/31/2012.  23. Status post stem cell collection.  24.Rright internal jugular thrombosis, 10/22/2012, likely related to pheresis catheter and HIT  25. Admission with acute right pulmonary embolism 10/27/2012  26. Superior mesenteric vein/portal vein thrombosis diagnosed on a CT of the abdomen 10/28/2012 and 10/29/2012-treated with Argatroban , maintained on Coumadin anticoagulation until he hospital admission March 2014 for stem cell therapy  27. HIT confirmed on a serotonin release assay 10/29/2012  28. History of Renal insufficiency during the December 2013 hospital admission-likely related to dehydration and? Contrast nephropathy , persistent mild elevation of the creatinine  29. Dysphagia-? Secondary to amyloidosis, reported when he was here on 05/08/2013. He did not complain of dysphagia today  30. Anemia secondary to multiple myeloma and chemotherapy , improved  31.right lower Extremity deep vein thrombosis diagnosed at [facility] July 2014 , repeat Doppler at [facility] 07/19/2013 with chronic thrombus in the right gastrocnemius, Eliquis has been discontinued  32. Fall August 2014 with a left shoulder injury, diagnosed with a  rotator cuff tear, status post surgical repair 09/19/2013  33. 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 The neuropathy symptoms have partially improved since Velcade was discontinued after treatment 10/16/2013  34. Cardiac MRI 05/22/2015-normal left ventricular size with severe concentric hypertrophy and mildly impaired systolic function, DEYC-14%, right ventricular hypertrophy, diffuse late gadolinium enhancement in the left and right ventricles-findings consistent with cardiac amyloidosis. Echocardiogram at [facility]-LVEF 50% 35. Multinodular goiter confirmed on thyroid ultrasound at [facility] 06/29/2016, amyloidosis of the thyroid confirmed at [facility]    Disposition: Mr. Simmering appears unchanged. He is now taking doxycycline for the amyloidosis. He had an echocardiogram yesterday (we cannot access the report today) and is scheduled for cardiology follow-up at [facility].  He will see Brent Allison in September. He will return for an office visit here in late September.  We are available to see him in the interim as needed.  15 minutes were spent with the patient today. The majority of the time was used for counseling and coordination of care.  Donneta Romberg, MD  06/02/2017  8:55 AM

## 2017-06-17 DIAGNOSIS — Z7982 Long term (current) use of aspirin: Secondary | ICD-10-CM | POA: Diagnosis not present

## 2017-06-17 DIAGNOSIS — E8581 Light chain (AL) amyloidosis: Secondary | ICD-10-CM | POA: Diagnosis not present

## 2017-06-17 DIAGNOSIS — C61 Malignant neoplasm of prostate: Secondary | ICD-10-CM | POA: Diagnosis not present

## 2017-06-17 DIAGNOSIS — G4730 Sleep apnea, unspecified: Secondary | ICD-10-CM | POA: Diagnosis not present

## 2017-06-17 DIAGNOSIS — Z9221 Personal history of antineoplastic chemotherapy: Secondary | ICD-10-CM | POA: Diagnosis not present

## 2017-06-17 DIAGNOSIS — Z87891 Personal history of nicotine dependence: Secondary | ICD-10-CM | POA: Diagnosis not present

## 2017-06-17 DIAGNOSIS — G629 Polyneuropathy, unspecified: Secondary | ICD-10-CM | POA: Diagnosis not present

## 2017-06-17 DIAGNOSIS — M4807 Spinal stenosis, lumbosacral region: Secondary | ICD-10-CM | POA: Diagnosis not present

## 2017-06-17 DIAGNOSIS — M4802 Spinal stenosis, cervical region: Secondary | ICD-10-CM | POA: Diagnosis not present

## 2017-06-17 DIAGNOSIS — Z6827 Body mass index (BMI) 27.0-27.9, adult: Secondary | ICD-10-CM | POA: Diagnosis not present

## 2017-06-17 DIAGNOSIS — R5383 Other fatigue: Secondary | ICD-10-CM | POA: Diagnosis not present

## 2017-06-17 DIAGNOSIS — E785 Hyperlipidemia, unspecified: Secondary | ICD-10-CM | POA: Diagnosis not present

## 2017-06-17 DIAGNOSIS — I5032 Chronic diastolic (congestive) heart failure: Secondary | ICD-10-CM | POA: Diagnosis not present

## 2017-06-17 DIAGNOSIS — Z86718 Personal history of other venous thrombosis and embolism: Secondary | ICD-10-CM | POA: Diagnosis not present

## 2017-06-27 DIAGNOSIS — R0609 Other forms of dyspnea: Secondary | ICD-10-CM | POA: Diagnosis not present

## 2017-06-27 DIAGNOSIS — I5032 Chronic diastolic (congestive) heart failure: Secondary | ICD-10-CM | POA: Diagnosis not present

## 2017-06-27 DIAGNOSIS — E8581 Light chain (AL) amyloidosis: Secondary | ICD-10-CM | POA: Diagnosis not present

## 2017-07-22 DIAGNOSIS — I5032 Chronic diastolic (congestive) heart failure: Secondary | ICD-10-CM | POA: Diagnosis not present

## 2017-07-22 DIAGNOSIS — Z6827 Body mass index (BMI) 27.0-27.9, adult: Secondary | ICD-10-CM | POA: Diagnosis not present

## 2017-07-22 DIAGNOSIS — E8581 Light chain (AL) amyloidosis: Secondary | ICD-10-CM | POA: Diagnosis not present

## 2017-08-01 DIAGNOSIS — H5203 Hypermetropia, bilateral: Secondary | ICD-10-CM | POA: Diagnosis not present

## 2017-08-01 DIAGNOSIS — H2513 Age-related nuclear cataract, bilateral: Secondary | ICD-10-CM | POA: Diagnosis not present

## 2017-08-01 DIAGNOSIS — S0012XA Contusion of left eyelid and periocular area, initial encounter: Secondary | ICD-10-CM | POA: Diagnosis not present

## 2017-08-02 DIAGNOSIS — I5032 Chronic diastolic (congestive) heart failure: Secondary | ICD-10-CM | POA: Diagnosis not present

## 2017-08-02 DIAGNOSIS — I742 Embolism and thrombosis of arteries of the upper extremities: Secondary | ICD-10-CM | POA: Diagnosis not present

## 2017-08-02 DIAGNOSIS — Z9481 Bone marrow transplant status: Secondary | ICD-10-CM | POA: Diagnosis not present

## 2017-08-02 DIAGNOSIS — Z6827 Body mass index (BMI) 27.0-27.9, adult: Secondary | ICD-10-CM | POA: Diagnosis not present

## 2017-08-02 DIAGNOSIS — E8581 Light chain (AL) amyloidosis: Secondary | ICD-10-CM | POA: Diagnosis not present

## 2017-08-02 DIAGNOSIS — R0609 Other forms of dyspnea: Secondary | ICD-10-CM | POA: Diagnosis not present

## 2017-08-03 DIAGNOSIS — M7061 Trochanteric bursitis, right hip: Secondary | ICD-10-CM | POA: Diagnosis not present

## 2017-08-16 ENCOUNTER — Ambulatory Visit (HOSPITAL_BASED_OUTPATIENT_CLINIC_OR_DEPARTMENT_OTHER): Payer: Medicare Other | Admitting: Oncology

## 2017-08-16 ENCOUNTER — Telehealth: Payer: Self-pay | Admitting: Oncology

## 2017-08-16 VITALS — BP 111/57 | HR 62 | Temp 98.7°F | Resp 20 | Ht 72.0 in | Wt 200.9 lb

## 2017-08-16 DIAGNOSIS — E8581 Light chain (AL) amyloidosis: Secondary | ICD-10-CM

## 2017-08-16 DIAGNOSIS — Z86718 Personal history of other venous thrombosis and embolism: Secondary | ICD-10-CM | POA: Diagnosis not present

## 2017-08-16 DIAGNOSIS — Z23 Encounter for immunization: Secondary | ICD-10-CM

## 2017-08-16 MED ORDER — INFLUENZA VAC SPLIT QUAD 0.5 ML IM SUSY
0.5000 mL | PREFILLED_SYRINGE | Freq: Once | INTRAMUSCULAR | Status: DC
  Filled 2017-08-16: qty 0.5

## 2017-08-16 NOTE — Telephone Encounter (Signed)
Scheduled appt per 9/25 los - Gave patient AVS and calender per los.  

## 2017-08-16 NOTE — Progress Notes (Signed)
[place] OFFICE PROGRESS NOTE   Diagnosis: Amyloidosis  INTERVAL HISTORY:   Mr.Brent Allison returns as scheduled. No specific complaint. He saw Dr. Amalia Hailey earlier this month. He continues doxycycline.    Objective:  Vital signs in last 24 hours:  Blood pressure (!) 111/57, pulse 62, temperature 98.7 F (37.1 C), temperature source Oral, resp. rate 20, height 6' (1.829 m), weight 200 lb 14.4 oz (91.1 kg), SpO2 100 %.    HEENT:  macroglossia Resp:  lungs clear bilaterally Cardio:  regular rate and rhythm GI:  no hepatosplenomegaly Vascular:  the right lower leg is slightly larger than the left side    Portacath/PICC-without erythema  Lab Results:  06/27/2017 at [facility]: Serum immunofixation-monoclonal IgG kappa, free lambda light chains 4.87, free kappa light chains 1.82 Hemolymph 13.7, platelets 188,000, white count 6.3, ANC 3.6   Medications: I have reviewed the patient's current medications.  Assessment/Plan: 1. 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 2. "Bruising" at the eyelids and mouth: Likely related to amyloidosis, persistent. 3. Numbness and loss of vibratory sense at the fingertips: This predated Velcade-based therapy, but worsened. Now much improved following cervical spine surgery. 4. Elevated serum free lambda light chains. The lambda light chains were lower on November 16 and slightly higher on 12/14/2011. 5. Lambda light chain proteinuria. 6. Bone marrow plasmacytosis: Variable increase in plasma cells noted on the bone marrow biopsy 07/29/2011 with plasma cells estimated to represent between 4% and 20% of the cellular population. 7. Remote history of prostate cancer. 8. Sleep apnea. 9. Dyslipidemia. 10. Report of pneumonia on 2 occasions in 2011. 11. Admission to a hospital in [place], [place] October 2012 with "pneumonia." A chest x-ray at [facility] on November 2 was negative .  12. Low serum immunoglobulin G level. 13. Plasma  cell dyscrasia with associated amyloidosis.  1. Initiation of systemic therapy with Cytoxan, Velcade, and Decadron 08/12/2011. Cycle #2 was initiated on 09/16/2011. Cycle #3 was initiated on 10/15/2011. Velcade was placed on hold due to neuropathy. 2. The serum free lambda light chains were decreased on October 08 2011. 3. The serum free lambda light chains were slightly increased on 12/14/2011, lower on 12/29/2011 and 02/02/2012. 4. Initiation of Revlimid/Decadron February 2013, cycle 2 started on 01/28/2012. 5. High-dose melphalan chemotherapy followed by autologous stem cell infusion on 02/09/2013 6. Initiation of Velcade/Decadrontherapy 05/28/2013, he completed 2 cycles on a day 1, 4, 8, 11 schedule 7. Improvement in the serum free lambda light chains following induction Velcade/Decadron  8. Initiation of weekly Velcade/Decadron on 07/16/2013, last treatment on 10/16/2013 9. Improvement in the serum free lambda light chains on 08/21/2013 and 09/18/2013 ([facility]) 10. Normal serum free lambda light chains on 12/13/2013 11. Mild increase of the serum free lambda light chains beginning January 2016-stable 12. treatment on the Pronto study at [place] beginning 12/17/2015, in of study 11/23/2016  Started on phase 2b Prothenastudy-long-term safety/efficacy study 01/11/2017, discontinued May 2018   Doxycycline started 05/17/2017  15. Loss of "balance "and proximal motor weakness secondary to cervical stenosis-status post decompression surgery on 02/19/2012. Improved, but not resolved stenosis with mass effect on the spinal cord noted on a repeat MRI 03/28/2012. He underwent further decompression surgery on 04/06/2012. The ataxia, weakness, and peripheral numbness is much improved.  16. Right lower Extremity deep vein thrombosis 02/02/2012-a Doppler ultrasound confirmed a gastrocnemius and peroneal deep vein thrombosis. An IVC filter was placed prior to surgery and he was maintained on Lovenox. The IVC  filter was removed on 05/04/2012.  17. Anorexia following cervical spine surgery. Improved.  18. Episode of flank pain and hematuria in 03/31/2012-etiology unclear. He completed a course of antibiotics. The hematuria and pain have resolved.  19. Rectal wall thickening noted on a CT of the pelvis 03/31/2012.  20. colonoscopy showed an area of abnormality at the rectum with biopsy positive for amyloid.  21. Lung nodule noted on the CT 03/31/2012 , no lung nodule on a chest CT 07/30/2013  22. Lumbar stenosis-status post surgery on 07/31/2012.  23. Status post stem cell collection.  24.Rright internal jugular thrombosis, 10/22/2012, likely related to pheresis catheter and HIT  25. Admission with acute right pulmonary embolism 10/27/2012  26. Superior mesenteric vein/portal vein thrombosis diagnosed on a CT of the abdomen 10/28/2012 and 10/29/2012-treated with Argatroban , maintained on Coumadin anticoagulation until he hospital admission March 2014 for stem cell therapy  27. HIT confirmed on a serotonin release assay 10/29/2012  28. History of Renal insufficiency during the December 2013 hospital admission-likely related to dehydration and? Contrast nephropathy , persistent mild elevation of the creatinine  29. Dysphagia-? Secondary to amyloidosis, reported when he was here on 05/08/2013. He did not complain of dysphagia today  30. Anemia secondary to multiple myeloma and chemotherapy , improved  31.right lower Extremity deep vein thrombosis diagnosed at [facility] July 2014 , repeat Doppler at [facility] 07/19/2013 with chronic thrombus in the right gastrocnemius, Eliquis has been discontinued  32. Fall August 2014 with a left shoulder injury, diagnosed with a rotator cuff tear, status post surgical repair 09/19/2013  33. 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 The neuropathy symptoms have partially improved since Velcade was discontinued after treatment 10/16/2013  34.  Cardiac MRI 05/22/2015-normal left ventricular size with severe concentric hypertrophy and mildly impaired systolic function, WUJW-11%, right ventricular hypertrophy, diffuse late gadolinium enhancement in the left and right ventricles-findings consistent with cardiac amyloidosis. Echocardiogram at [facility]-LVEF 50% 35. Multinodular goiter confirmed on thyroid ultrasound at [facility] 06/29/2016, amyloidosis of the thyroid confirmed at [facility]    Disposition:   He appears unchanged. He will continue doxycycline. He is scheduled to see Dr. Amalia Hailey in early January. He will return for an office visit here later in January. He will obtain an influenza vaccine. He will receive a tetanus booster.  15 minutes were spent with the patient today. The majority of the time was used for counseling and coordination of care.  Donneta Romberg, MD  08/16/2017  8:33 AM

## 2017-08-28 DIAGNOSIS — Z23 Encounter for immunization: Secondary | ICD-10-CM | POA: Diagnosis not present

## 2017-09-13 ENCOUNTER — Telehealth: Payer: Self-pay | Admitting: Oncology

## 2017-09-13 ENCOUNTER — Ambulatory Visit (HOSPITAL_BASED_OUTPATIENT_CLINIC_OR_DEPARTMENT_OTHER): Payer: Medicare Other | Admitting: Oncology

## 2017-09-13 ENCOUNTER — Ambulatory Visit: Payer: Medicare Other

## 2017-09-13 VITALS — BP 113/69 | HR 59 | Temp 98.3°F | Resp 18 | Ht 72.0 in | Wt 210.3 lb

## 2017-09-13 DIAGNOSIS — E8581 Light chain (AL) amyloidosis: Secondary | ICD-10-CM

## 2017-09-13 NOTE — Progress Notes (Signed)
Error, appointment scheduled by mistake

## 2017-09-13 NOTE — Telephone Encounter (Signed)
No 10/23 los.

## 2017-09-23 DIAGNOSIS — Z6828 Body mass index (BMI) 28.0-28.9, adult: Secondary | ICD-10-CM | POA: Diagnosis not present

## 2017-09-23 DIAGNOSIS — I742 Embolism and thrombosis of arteries of the upper extremities: Secondary | ICD-10-CM | POA: Diagnosis not present

## 2017-09-23 DIAGNOSIS — I81 Portal vein thrombosis: Secondary | ICD-10-CM | POA: Diagnosis not present

## 2017-09-23 DIAGNOSIS — I5032 Chronic diastolic (congestive) heart failure: Secondary | ICD-10-CM | POA: Diagnosis not present

## 2017-09-23 DIAGNOSIS — D7582 Heparin induced thrombocytopenia (HIT): Secondary | ICD-10-CM | POA: Diagnosis not present

## 2017-09-23 DIAGNOSIS — I82409 Acute embolism and thrombosis of unspecified deep veins of unspecified lower extremity: Secondary | ICD-10-CM | POA: Diagnosis not present

## 2017-09-23 DIAGNOSIS — E8581 Light chain (AL) amyloidosis: Secondary | ICD-10-CM | POA: Diagnosis not present

## 2017-09-23 DIAGNOSIS — Z9481 Bone marrow transplant status: Secondary | ICD-10-CM | POA: Diagnosis not present

## 2017-09-23 DIAGNOSIS — I2699 Other pulmonary embolism without acute cor pulmonale: Secondary | ICD-10-CM | POA: Diagnosis not present

## 2017-11-23 DIAGNOSIS — Z7982 Long term (current) use of aspirin: Secondary | ICD-10-CM | POA: Diagnosis not present

## 2017-11-23 DIAGNOSIS — Q382 Macroglossia: Secondary | ICD-10-CM | POA: Diagnosis not present

## 2017-11-23 DIAGNOSIS — R0609 Other forms of dyspnea: Secondary | ICD-10-CM | POA: Diagnosis not present

## 2017-11-23 DIAGNOSIS — R233 Spontaneous ecchymoses: Secondary | ICD-10-CM | POA: Diagnosis not present

## 2017-11-23 DIAGNOSIS — Z9484 Stem cells transplant status: Secondary | ICD-10-CM | POA: Diagnosis not present

## 2017-11-23 DIAGNOSIS — E042 Nontoxic multinodular goiter: Secondary | ICD-10-CM | POA: Diagnosis not present

## 2017-11-23 DIAGNOSIS — R600 Localized edema: Secondary | ICD-10-CM | POA: Diagnosis not present

## 2017-11-23 DIAGNOSIS — R200 Anesthesia of skin: Secondary | ICD-10-CM | POA: Diagnosis not present

## 2017-11-23 DIAGNOSIS — Z7901 Long term (current) use of anticoagulants: Secondary | ICD-10-CM | POA: Diagnosis not present

## 2017-11-23 DIAGNOSIS — G629 Polyneuropathy, unspecified: Secondary | ICD-10-CM | POA: Diagnosis not present

## 2017-11-23 DIAGNOSIS — R599 Enlarged lymph nodes, unspecified: Secondary | ICD-10-CM | POA: Diagnosis not present

## 2017-11-23 DIAGNOSIS — Z6828 Body mass index (BMI) 28.0-28.9, adult: Secondary | ICD-10-CM | POA: Diagnosis not present

## 2017-11-23 DIAGNOSIS — Z9221 Personal history of antineoplastic chemotherapy: Secondary | ICD-10-CM | POA: Diagnosis not present

## 2017-11-23 DIAGNOSIS — E8581 Light chain (AL) amyloidosis: Secondary | ICD-10-CM | POA: Diagnosis not present

## 2017-11-23 DIAGNOSIS — Z888 Allergy status to other drugs, medicaments and biological substances status: Secondary | ICD-10-CM | POA: Diagnosis not present

## 2017-11-23 DIAGNOSIS — I5032 Chronic diastolic (congestive) heart failure: Secondary | ICD-10-CM | POA: Diagnosis not present

## 2017-11-23 DIAGNOSIS — Z79899 Other long term (current) drug therapy: Secondary | ICD-10-CM | POA: Diagnosis not present

## 2017-11-23 DIAGNOSIS — R809 Proteinuria, unspecified: Secondary | ICD-10-CM | POA: Diagnosis not present

## 2017-11-23 DIAGNOSIS — Z86711 Personal history of pulmonary embolism: Secondary | ICD-10-CM | POA: Diagnosis not present

## 2017-11-23 DIAGNOSIS — Z9481 Bone marrow transplant status: Secondary | ICD-10-CM | POA: Diagnosis not present

## 2017-11-23 DIAGNOSIS — I2699 Other pulmonary embolism without acute cor pulmonale: Secondary | ICD-10-CM | POA: Diagnosis not present

## 2017-11-23 DIAGNOSIS — H579 Unspecified disorder of eye and adnexa: Secondary | ICD-10-CM | POA: Diagnosis not present

## 2017-11-23 DIAGNOSIS — D72822 Plasmacytosis: Secondary | ICD-10-CM | POA: Diagnosis not present

## 2017-12-08 ENCOUNTER — Telehealth: Payer: Self-pay | Admitting: Oncology

## 2017-12-08 ENCOUNTER — Inpatient Hospital Stay: Payer: Medicare Other | Attending: Oncology | Admitting: Oncology

## 2017-12-08 VITALS — BP 97/48 | HR 59 | Temp 98.4°F | Resp 20 | Wt 207.5 lb

## 2017-12-08 DIAGNOSIS — E859 Amyloidosis, unspecified: Secondary | ICD-10-CM | POA: Diagnosis not present

## 2017-12-08 DIAGNOSIS — E8581 Light chain (AL) amyloidosis: Secondary | ICD-10-CM

## 2017-12-08 DIAGNOSIS — Z86718 Personal history of other venous thrombosis and embolism: Secondary | ICD-10-CM | POA: Insufficient documentation

## 2017-12-08 DIAGNOSIS — C9000 Multiple myeloma not having achieved remission: Secondary | ICD-10-CM | POA: Diagnosis not present

## 2017-12-08 DIAGNOSIS — Z8546 Personal history of malignant neoplasm of prostate: Secondary | ICD-10-CM | POA: Insufficient documentation

## 2017-12-08 NOTE — Telephone Encounter (Signed)
Scheduled appt per 1/17 los - Gave patient aVS And calender per los.

## 2017-12-08 NOTE — Progress Notes (Signed)
[place] OFFICE PROGRESS NOTE   Diagnosis: Amyloidosis  INTERVAL HISTORY:   Brent Allison returns as scheduled.  He has a "cold ".  He reports sinus drainage.  No fever or dyspnea. He saw Dr. Amalia Hailey on 11/23/2017.  The serum free lambda light chains were slightly higher at 5.39.  Dr. Amalia Hailey recommends continuing doxycycline.  Objective:  Vital signs in last 24 hours:  Blood pressure (!) 97/48, pulse (!) 59, temperature 98.4 F (36.9 C), temperature source Oral, resp. rate 20, weight 207 lb 8 oz (94.1 kg), SpO2 99 %.    HEENT: Macroglossia, no sinus tenderness, no ecchymoses surrounding the eyes Resp: Lungs clear bilaterally Cardio: Regular rate and rhythm GI: No hepatosplenomegaly Vascular: No leg edema    Medications: I have reviewed the patient's current medications.   Assessment/Plan: 1. 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 2. "Bruising" at the eyelids and mouth: Likely related to amyloidosis, persistent. 3. Numbness and loss of vibratory sense at the fingertips: This predated Velcade-based therapy, but worsened. Now much improved following cervical spine surgery. 4. Elevated serum free lambda light chains. The lambda light chains were lower on November 16 and slightly higher on 12/14/2011. 5. Lambda light chain proteinuria. 6. Bone marrow plasmacytosis: Variable increase in plasma cells noted on the bone marrow biopsy 07/29/2011 with plasma cells estimated to represent between 4% and 20% of the cellular population. 7. Remote history of prostate cancer. 8. Sleep apnea. 9. Dyslipidemia. 10. Report of pneumonia on 2 occasions in 2011. 11. Admission to a hospital in [place], [place] October 2012 with "pneumonia." A chest x-ray at [facility] on November 2 was negative .  12. Low serum immunoglobulin G level. 13. Plasma cell dyscrasia with associated amyloidosis.  1. Initiation of systemic therapy with Cytoxan, Velcade, and Decadron  08/12/2011. Cycle #2 was initiated on 09/16/2011. Cycle #3 was initiated on 10/15/2011. Velcade was placed on hold due to neuropathy. 2. The serum free lambda light chains were decreased on October 08 2011. 3. The serum free lambda light chains were slightly increased on 12/14/2011, lower on 12/29/2011 and 02/02/2012. 4. Initiation of Revlimid/Decadron February 2013, cycle 2 started on 01/28/2012. 5. High-dose melphalan chemotherapy followed by autologous stem cell infusion on 02/09/2013 6. Initiation of Velcade/Decadrontherapy 05/28/2013, he completed 2 cycles on a day 1, 4, 8, 11 schedule 7. Improvement in the serum free lambda light chains following induction Velcade/Decadron  8. Initiation of weekly Velcade/Decadron on 07/16/2013, last treatment on 10/16/2013 9. Improvement in the serum free lambda light chains on 08/21/2013 and 09/18/2013 ([facility]) 10. Normal serum free lambda light chains on 12/13/2013 11. Mild increase of the serum free lambda light chains beginning January 2016-stable 12. treatment on the Pronto study at [place] beginning 12/17/2015, in of study 11/23/2016  Started on phase 2b Prothenastudy-long-term safety/efficacy study 01/11/2017, discontinued May 2018   Doxycycline started 05/17/2017  15. Loss of "balance "and proximal motor weakness secondary to cervical stenosis-status post decompression surgery on 02/19/2012. Improved, but not resolved stenosis with mass effect on the spinal cord noted on a repeat MRI 03/28/2012. He underwent further decompression surgery on 04/06/2012. The ataxia, weakness, and peripheral numbness is much improved.  16. Right lower Extremity deep vein thrombosis 02/02/2012-a Doppler ultrasound confirmed a gastrocnemius and peroneal deep vein thrombosis. An IVC filter was placed prior to surgery and he was maintained on Lovenox. The IVC filter was removed on 05/04/2012.  17. Anorexia following cervical spine surgery. Improved.  18. Episode of flank  pain and  hematuria in 03/31/2012-etiology unclear. He completed a course of antibiotics. The hematuria and pain have resolved.  19. Rectal wall thickening noted on a CT of the pelvis 03/31/2012.  20. colonoscopy showed an area of abnormality at the rectum with biopsy positive for amyloid.  21. Lung nodule noted on the CT 03/31/2012 , no lung nodule on a chest CT 07/30/2013  22. Lumbar stenosis-status post surgery on 07/31/2012.  23. Status post stem cell collection.  24.Rright internal jugular thrombosis, 10/22/2012, likely related to pheresis catheter and HIT  25. Admission with acute right pulmonary embolism 10/27/2012  26. Superior mesenteric vein/portal vein thrombosis diagnosed on a CT of the abdomen 10/28/2012 and 10/29/2012-treated with Argatroban , maintained on Coumadin anticoagulation until he hospital admission March 2014 for stem cell therapy  27. HIT confirmed on a serotonin release assay 10/29/2012  28. History of Renal insufficiency during the December 2013 hospital admission-likely related to dehydration and? Contrast nephropathy , persistent mild elevation of the creatinine  29. Dysphagia-? Secondary to amyloidosis, reported when he was here on 05/08/2013. He did not complain of dysphagia today  30. Anemia secondary to multiple myeloma and chemotherapy , improved  31.right lower Extremity deep vein thrombosis diagnosed at [facility] July 2014 , repeat Doppler at [facility] 07/19/2013 with chronic thrombus in the right gastrocnemius, Eliquis has been discontinued  32. Fall August 2014 with a left shoulder injury, diagnosed with a rotator cuff tear, status post surgical repair 09/19/2013  33. 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 The neuropathy symptoms have partially improved since Velcade was discontinued after treatment 10/16/2013  34. Cardiac MRI 05/22/2015-normal left ventricular size with severe concentric hypertrophy and mildly impaired systolic  function, SEGB-15%, right ventricular hypertrophy, diffuse late gadolinium enhancement in the left and right ventricles-findings consistent with cardiac amyloidosis. Echocardiogram at [facility]-LVEF 50% 35. Multinodular goiter confirmed on thyroid ultrasound at [facility] 06/29/2016, amyloidosis of the thyroid confirmed at [facility]     Disposition: He appears unchanged.  He continues doxycycline for treatment of amyloidosis.  He will see Dr. Amalia Hailey for repeat labs in 3 months.  He most likely has a viral upper respiratory infection. [facility] Allison will return for an office visit here in 4 months.  15 minutes were spent with the patient today.  The majority of the time was used for counseling and coordination of care.  Betsy Coder, MD  12/08/2017  8:56 AM

## 2017-12-20 DIAGNOSIS — M48062 Spinal stenosis, lumbar region with neurogenic claudication: Secondary | ICD-10-CM | POA: Diagnosis not present

## 2017-12-20 DIAGNOSIS — M5136 Other intervertebral disc degeneration, lumbar region: Secondary | ICD-10-CM | POA: Diagnosis not present

## 2017-12-20 DIAGNOSIS — Z981 Arthrodesis status: Secondary | ICD-10-CM | POA: Diagnosis not present

## 2017-12-20 DIAGNOSIS — M5416 Radiculopathy, lumbar region: Secondary | ICD-10-CM | POA: Diagnosis not present

## 2017-12-20 DIAGNOSIS — M4727 Other spondylosis with radiculopathy, lumbosacral region: Secondary | ICD-10-CM | POA: Diagnosis not present

## 2017-12-30 DIAGNOSIS — D7582 Heparin induced thrombocytopenia (HIT): Secondary | ICD-10-CM | POA: Diagnosis not present

## 2017-12-30 DIAGNOSIS — G4730 Sleep apnea, unspecified: Secondary | ICD-10-CM | POA: Diagnosis not present

## 2017-12-30 DIAGNOSIS — E8581 Light chain (AL) amyloidosis: Secondary | ICD-10-CM | POA: Diagnosis not present

## 2017-12-30 DIAGNOSIS — Z9481 Bone marrow transplant status: Secondary | ICD-10-CM | POA: Diagnosis not present

## 2017-12-30 DIAGNOSIS — I43 Cardiomyopathy in diseases classified elsewhere: Secondary | ICD-10-CM | POA: Diagnosis not present

## 2017-12-30 DIAGNOSIS — K55059 Acute (reversible) ischemia of intestine, part and extent unspecified: Secondary | ICD-10-CM | POA: Diagnosis not present

## 2017-12-30 DIAGNOSIS — E854 Organ-limited amyloidosis: Secondary | ICD-10-CM | POA: Diagnosis not present

## 2017-12-30 DIAGNOSIS — I5032 Chronic diastolic (congestive) heart failure: Secondary | ICD-10-CM | POA: Diagnosis not present

## 2017-12-30 DIAGNOSIS — Z6828 Body mass index (BMI) 28.0-28.9, adult: Secondary | ICD-10-CM | POA: Diagnosis not present

## 2018-01-02 DIAGNOSIS — N528 Other male erectile dysfunction: Secondary | ICD-10-CM | POA: Diagnosis not present

## 2018-01-02 DIAGNOSIS — Z8546 Personal history of malignant neoplasm of prostate: Secondary | ICD-10-CM | POA: Diagnosis not present

## 2018-01-02 DIAGNOSIS — C61 Malignant neoplasm of prostate: Secondary | ICD-10-CM | POA: Diagnosis not present

## 2018-02-22 DIAGNOSIS — Z7901 Long term (current) use of anticoagulants: Secondary | ICD-10-CM | POA: Diagnosis not present

## 2018-02-22 DIAGNOSIS — Z86711 Personal history of pulmonary embolism: Secondary | ICD-10-CM | POA: Diagnosis not present

## 2018-02-22 DIAGNOSIS — I5032 Chronic diastolic (congestive) heart failure: Secondary | ICD-10-CM | POA: Diagnosis not present

## 2018-02-22 DIAGNOSIS — Z791 Long term (current) use of non-steroidal anti-inflammatories (NSAID): Secondary | ICD-10-CM | POA: Diagnosis not present

## 2018-02-22 DIAGNOSIS — Z79899 Other long term (current) drug therapy: Secondary | ICD-10-CM | POA: Diagnosis not present

## 2018-02-22 DIAGNOSIS — R0609 Other forms of dyspnea: Secondary | ICD-10-CM | POA: Diagnosis not present

## 2018-02-22 DIAGNOSIS — Z9481 Bone marrow transplant status: Secondary | ICD-10-CM | POA: Diagnosis not present

## 2018-02-22 DIAGNOSIS — E8581 Light chain (AL) amyloidosis: Secondary | ICD-10-CM | POA: Diagnosis not present

## 2018-02-22 DIAGNOSIS — I5030 Unspecified diastolic (congestive) heart failure: Secondary | ICD-10-CM | POA: Diagnosis not present

## 2018-02-22 DIAGNOSIS — Z6829 Body mass index (BMI) 29.0-29.9, adult: Secondary | ICD-10-CM | POA: Diagnosis not present

## 2018-02-22 DIAGNOSIS — Z9221 Personal history of antineoplastic chemotherapy: Secondary | ICD-10-CM | POA: Diagnosis not present

## 2018-02-22 DIAGNOSIS — Z9484 Stem cells transplant status: Secondary | ICD-10-CM | POA: Diagnosis not present

## 2018-02-22 DIAGNOSIS — Z888 Allergy status to other drugs, medicaments and biological substances status: Secondary | ICD-10-CM | POA: Diagnosis not present

## 2018-04-07 ENCOUNTER — Telehealth: Payer: Self-pay | Admitting: Oncology

## 2018-04-07 ENCOUNTER — Inpatient Hospital Stay: Payer: Medicare Other

## 2018-04-07 ENCOUNTER — Inpatient Hospital Stay: Payer: Medicare Other | Attending: Oncology | Admitting: Oncology

## 2018-04-07 VITALS — BP 122/74 | HR 68 | Temp 98.4°F | Resp 18 | Ht 72.0 in | Wt 212.7 lb

## 2018-04-07 DIAGNOSIS — R5381 Other malaise: Secondary | ICD-10-CM

## 2018-04-07 DIAGNOSIS — Z8546 Personal history of malignant neoplasm of prostate: Secondary | ICD-10-CM | POA: Diagnosis not present

## 2018-04-07 DIAGNOSIS — E8581 Light chain (AL) amyloidosis: Secondary | ICD-10-CM

## 2018-04-07 DIAGNOSIS — Z86718 Personal history of other venous thrombosis and embolism: Secondary | ICD-10-CM

## 2018-04-07 LAB — CMP (CANCER CENTER ONLY)
ALT: 17 U/L (ref 0–55)
AST: 19 U/L (ref 5–34)
Albumin: 3.6 g/dL (ref 3.5–5.0)
Alkaline Phosphatase: 97 U/L (ref 40–150)
Anion gap: 6 (ref 3–11)
BUN: 24 mg/dL (ref 7–26)
CO2: 24 mmol/L (ref 22–29)
Calcium: 8.9 mg/dL (ref 8.4–10.4)
Chloride: 108 mmol/L (ref 98–109)
Creatinine: 1.37 mg/dL — ABNORMAL HIGH (ref 0.70–1.30)
GFR, Est AFR Am: 57 mL/min — ABNORMAL LOW (ref >60)
GFR, Estimated: 50 mL/min — ABNORMAL LOW (ref >60)
Glucose, Bld: 95 mg/dL (ref 70–140)
Potassium: 4.4 mmol/L (ref 3.5–5.1)
Sodium: 138 mmol/L (ref 136–145)
Total Bilirubin: 0.5 mg/dL (ref 0.2–1.2)
Total Protein: 6.4 g/dL (ref 6.4–8.3)

## 2018-04-07 LAB — CBC WITH DIFFERENTIAL (CANCER CENTER ONLY)
Basophils Absolute: 0 10*3/uL (ref 0.0–0.1)
Basophils Relative: 0 %
Eosinophils Absolute: 0.1 10*3/uL (ref 0.0–0.5)
Eosinophils Relative: 2 %
HCT: 40.6 % (ref 38.4–49.9)
Hemoglobin: 13.6 g/dL (ref 13.0–17.1)
Lymphocytes Relative: 24 %
Lymphs Abs: 1.7 10*3/uL (ref 0.9–3.3)
MCH: 32.3 pg (ref 27.2–33.4)
MCHC: 33.5 g/dL (ref 32.0–36.0)
MCV: 96.4 fL (ref 79.3–98.0)
Monocytes Absolute: 0.7 10*3/uL (ref 0.1–0.9)
Monocytes Relative: 10 %
Neutro Abs: 4.6 10*3/uL (ref 1.5–6.5)
Neutrophils Relative %: 64 %
Platelet Count: 154 10*3/uL (ref 140–400)
RBC: 4.21 MIL/uL (ref 4.20–5.82)
RDW: 14.2 % (ref 11.0–14.6)
WBC Count: 7.1 10*3/uL (ref 4.0–10.3)

## 2018-04-07 NOTE — Progress Notes (Signed)
[place] OFFICE PROGRESS NOTE   Diagnosis: Amyloidosis  INTERVAL HISTORY:   [facility] Allison returns as scheduled.  He saw Dr. Amalia Hailey on 02/22/2018.  Observation was recommended. Mr. Vandam reports increased malaise.  He fatigues more easily.  He reports his legs feel "heavy "in the afternoon and evening.  His eyes burn.  Objective:  Vital signs in last 24 hours:  Blood pressure 122/74, pulse 68, temperature 98.4 F (36.9 C), temperature source Oral, resp. rate 18, height 6' (1.829 m), weight 212 lb 11.2 oz (96.5 kg), SpO2 100 %.    HEENT: Macroglossia, no ecchymoses in the mouth or around the eyes. Resp: Lungs clear bilaterally Cardio: Regular rate and rhythm GI: Nontender, no hepatosplenomegaly Vascular: Trace pitting edema at the right greater than left lower leg and ankle Neuro: The leg and foot strength are intact bilaterally   Lab Results:  Lab Results  Component Value Date   WBC 7.1 04/07/2018   HGB 13.6 04/07/2018   HCT 40.6 04/07/2018   MCV 96.4 04/07/2018   PLT 154 04/07/2018   NEUTROABS 4.6 04/07/2018    CMP     Component Value Date/Time   NA 138 04/07/2018 0914   NA 140 01/29/2016 0831   K 4.4 04/07/2018 0914   K 4.4 01/29/2016 0831   CL 108 04/07/2018 0914   CL 106 03/14/2013 1304   CO2 24 04/07/2018 0914   CO2 24 01/29/2016 0831   GLUCOSE 95 04/07/2018 0914   GLUCOSE 105 01/29/2016 0831   GLUCOSE 94 03/14/2013 1304   BUN 24 04/07/2018 0914   BUN 21.5 01/29/2016 0831   CREATININE 1.37 (H) 04/07/2018 0914   CREATININE 1.3 01/29/2016 0831   CALCIUM 8.9 04/07/2018 0914   CALCIUM 9.1 01/29/2016 0831   PROT 6.4 04/07/2018 0914   PROT 6.1 01/29/2016 0831   PROT 6.5 01/29/2016 0831   ALBUMIN 3.6 04/07/2018 0914   ALBUMIN 3.5 01/29/2016 0831   AST 19 04/07/2018 0914   AST 16 01/29/2016 0831   ALT 17 04/07/2018 0914   ALT 10 01/29/2016 0831   ALKPHOS 97 04/07/2018 0914   ALKPHOS 92 01/29/2016 0831   BILITOT 0.5 04/07/2018 0914   BILITOT 0.41 01/29/2016 0831   GFRNONAA 50 (L) 04/07/2018 0914   GFRAA 57 (L) 04/07/2018 0914    No results found for: CEA1  Lab Results  Component Value Date   INR 1.20 (L) 03/13/2013    Imaging:  No results found.  Medications: I have reviewed the patient's current medications.   Assessment/Plan: 1. 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 2. "Bruising" at the eyelids and mouth: Likely related to amyloidosis, persistent. 3. Numbness and loss of vibratory sense at the fingertips: This predated Velcade-based therapy, but worsened. Now much improved following cervical spine surgery. 4. Elevated serum free lambda light chains. The lambda light chains were lower on November 16 and slightly higher on 12/14/2011. 5. Lambda light chain proteinuria. 6. Bone marrow plasmacytosis: Variable increase in plasma cells noted on the bone marrow biopsy 07/29/2011 with plasma cells estimated to represent between 4% and 20% of the cellular population. 7. Remote history of prostate cancer. 8. Sleep apnea. 9. Dyslipidemia. 10. Report of pneumonia on 2 occasions in 2011. 11. Admission to a hospital in [place], [place] October 2012 with "pneumonia." A chest x-ray at [facility] on November 2 was negative .  12. Low serum immunoglobulin G level. 13. Plasma cell dyscrasia with associated amyloidosis.  1. Initiation of systemic therapy with Cytoxan,  Velcade, and Decadron 08/12/2011. Cycle #2 was initiated on 09/16/2011. Cycle #3 was initiated on 10/15/2011. Velcade was placed on hold due to neuropathy. 2. The serum free lambda light chains were decreased on October 08 2011. 3. The serum free lambda light chains were slightly increased on 12/14/2011, lower on 12/29/2011 and 02/02/2012. 4. Initiation of Revlimid/Decadron February 2013, cycle 2 started on 01/28/2012. 5. High-dose melphalan chemotherapy followed by autologous stem cell infusion on 02/09/2013 6. Initiation of  Velcade/Decadrontherapy 05/28/2013, he completed 2 cycles on a day 1, 4, 8, 11 schedule 7. Improvement in the serum free lambda light chains following induction Velcade/Decadron  8. Initiation of weekly Velcade/Decadron on 07/16/2013, last treatment on 10/16/2013 9. Improvement in the serum free lambda light chains on 08/21/2013 and 09/18/2013 ([facility]) 10. Normal serum free lambda light chains on 12/13/2013 11. Mild increase of the serum free lambda light chains beginning January 2016-stable 12. treatment on the Pronto study at [place] beginning 12/17/2015, in of study 11/23/2016  Started on phase 2b Prothenastudy-long-term safety/efficacy study 01/11/2017,discontinued May 2018  Doxycycline started 05/17/2017 15. Loss of "balance "and proximal motor weakness secondary to cervical stenosis-status post decompression surgery on 02/19/2012. Improved, but not resolved stenosis with mass effect on the spinal cord noted on a repeat MRI 03/28/2012. He underwent further decompression surgery on 04/06/2012. The ataxia, weakness, and peripheral numbness is much improved.  16. Right lower Extremity deep vein thrombosis 02/02/2012-a Doppler ultrasound confirmed a gastrocnemius and peroneal deep vein thrombosis. An IVC filter was placed prior to surgery and he was maintained on Lovenox. The IVC filter was removed on 05/04/2012.  17. Anorexia following cervical spine surgery. Improved.  18. Episode of flank pain and hematuria in 03/31/2012-etiology unclear. He completed a course of antibiotics. The hematuria and pain have resolved.  19. Rectal wall thickening noted on a CT of the pelvis 03/31/2012.  20. colonoscopy showed an area of abnormality at the rectum with biopsy positive for amyloid.  21. Lung nodule noted on the CT 03/31/2012 , no lung nodule on a chest CT 07/30/2013  22. Lumbar stenosis-status post surgery on 07/31/2012.  23. Status post stem cell collection.  24.Rright internal jugular  thrombosis, 10/22/2012, likely related to pheresis catheter and HIT  25. Admission with acute right pulmonary embolism 10/27/2012  26. Superior mesenteric vein/portal vein thrombosis diagnosed on a CT of the abdomen 10/28/2012 and 10/29/2012-treated with Argatroban , maintained on Coumadin anticoagulation until he hospital admission March 2014 for stem cell therapy  27. HIT confirmed on a serotonin release assay 10/29/2012  28. History of Renal insufficiency during the December 2013 hospital admission-likely related to dehydration and? Contrast nephropathy , persistent mild elevation of the creatinine  29. Dysphagia-? Secondary to amyloidosis, reported when he was here on 05/08/2013. He did not complain of dysphagia today  30. Anemia secondary to multiple myeloma and chemotherapy , improved  31.right lower Extremity deep vein thrombosis diagnosed at [facility] July 2014 , repeat Doppler at [facility] 07/19/2013 with chronic thrombus in the right gastrocnemius, Eliquis has been discontinued  32. Fall August 2014 with a left shoulder injury, diagnosed with a rotator cuff tear, status post surgical repair 09/19/2013  33. 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 The neuropathy symptoms have partially improved since Velcade was discontinued after treatment 10/16/2013  34. Cardiac MRI 05/22/2015-normal left ventricular size with severe concentric hypertrophy and mildly impaired systolic function, OVZC-58%, right ventricular hypertrophy, diffuse late gadolinium enhancement in the left and right ventricles-findings consistent with cardiac amyloidosis. Echocardiogram at [place]  50% 35. Multinodular goiter confirmed on thyroid ultrasound at [facility] 06/29/2016, amyloidosis of the thyroid confirmed at [facility]    Disposition: His overall status appears unchanged, but he reports increased malaise.  There is no laboratory evidence for progression of the amyloidosis or myeloma.  He is scheduled for  an echocardiogram at [facility] on 04/20/2018.  We will follow-up on this result.  He is scheduled to see Dr. Amalia Hailey in early August.  He will return for an office visit here during the week of 07/03/2018.  Mr. Kathan contact us in the interim for progression of the malaise or new symptoms.  Betsy Coder, MD  04/07/2018  4:06 PM

## 2018-04-07 NOTE — Telephone Encounter (Signed)
Scheduled appt per 5/17 los - Gave patient AVS and calender per los.  

## 2018-04-20 DIAGNOSIS — I43 Cardiomyopathy in diseases classified elsewhere: Secondary | ICD-10-CM | POA: Diagnosis not present

## 2018-04-20 DIAGNOSIS — E854 Organ-limited amyloidosis: Secondary | ICD-10-CM | POA: Diagnosis not present

## 2018-05-03 DIAGNOSIS — C4442 Squamous cell carcinoma of skin of scalp and neck: Secondary | ICD-10-CM | POA: Diagnosis not present

## 2018-05-03 DIAGNOSIS — C44329 Squamous cell carcinoma of skin of other parts of face: Secondary | ICD-10-CM | POA: Diagnosis not present

## 2018-05-03 DIAGNOSIS — L820 Inflamed seborrheic keratosis: Secondary | ICD-10-CM | POA: Diagnosis not present

## 2018-05-03 DIAGNOSIS — L568 Other specified acute skin changes due to ultraviolet radiation: Secondary | ICD-10-CM | POA: Diagnosis not present

## 2018-05-03 DIAGNOSIS — Z85828 Personal history of other malignant neoplasm of skin: Secondary | ICD-10-CM | POA: Diagnosis not present

## 2018-05-03 DIAGNOSIS — D485 Neoplasm of uncertain behavior of skin: Secondary | ICD-10-CM | POA: Diagnosis not present

## 2018-05-03 DIAGNOSIS — L570 Actinic keratosis: Secondary | ICD-10-CM | POA: Diagnosis not present

## 2018-05-05 DIAGNOSIS — Z9481 Bone marrow transplant status: Secondary | ICD-10-CM | POA: Diagnosis not present

## 2018-05-05 DIAGNOSIS — Z6827 Body mass index (BMI) 27.0-27.9, adult: Secondary | ICD-10-CM | POA: Diagnosis not present

## 2018-05-05 DIAGNOSIS — I2699 Other pulmonary embolism without acute cor pulmonale: Secondary | ICD-10-CM | POA: Diagnosis not present

## 2018-05-05 DIAGNOSIS — R0609 Other forms of dyspnea: Secondary | ICD-10-CM | POA: Diagnosis not present

## 2018-05-05 DIAGNOSIS — E8581 Light chain (AL) amyloidosis: Secondary | ICD-10-CM | POA: Diagnosis not present

## 2018-05-05 DIAGNOSIS — I5032 Chronic diastolic (congestive) heart failure: Secondary | ICD-10-CM | POA: Diagnosis not present

## 2018-05-05 DIAGNOSIS — D7582 Heparin induced thrombocytopenia (HIT): Secondary | ICD-10-CM | POA: Diagnosis not present

## 2018-05-10 ENCOUNTER — Inpatient Hospital Stay: Payer: Medicare Other | Attending: Oncology

## 2018-05-10 ENCOUNTER — Other Ambulatory Visit: Payer: Self-pay

## 2018-05-10 ENCOUNTER — Telehealth: Payer: Self-pay

## 2018-05-10 DIAGNOSIS — C9000 Multiple myeloma not having achieved remission: Secondary | ICD-10-CM | POA: Diagnosis not present

## 2018-05-10 LAB — BASIC METABOLIC PANEL - CANCER CENTER ONLY
Anion gap: 6 (ref 3–11)
BUN: 21 mg/dL (ref 7–26)
CO2: 25 mmol/L (ref 22–29)
Calcium: 9.1 mg/dL (ref 8.4–10.4)
Chloride: 107 mmol/L (ref 98–109)
Creatinine: 1.36 mg/dL — ABNORMAL HIGH (ref 0.70–1.30)
GFR, Est AFR Am: 58 mL/min — ABNORMAL LOW (ref >60)
GFR, Estimated: 50 mL/min — ABNORMAL LOW (ref >60)
Glucose, Bld: 95 mg/dL (ref 70–140)
Potassium: 4.3 mmol/L (ref 3.5–5.1)
Sodium: 138 mmol/L (ref 136–145)

## 2018-05-10 NOTE — Telephone Encounter (Signed)
Scheduled a lab perSherrill approved. Tonya called and confirmed. Per 6/18 walk in

## 2018-05-30 DIAGNOSIS — Z85828 Personal history of other malignant neoplasm of skin: Secondary | ICD-10-CM | POA: Diagnosis not present

## 2018-05-30 DIAGNOSIS — C44329 Squamous cell carcinoma of skin of other parts of face: Secondary | ICD-10-CM | POA: Diagnosis not present

## 2018-05-30 DIAGNOSIS — L988 Other specified disorders of the skin and subcutaneous tissue: Secondary | ICD-10-CM | POA: Diagnosis not present

## 2018-06-28 DIAGNOSIS — Z86711 Personal history of pulmonary embolism: Secondary | ICD-10-CM | POA: Diagnosis not present

## 2018-06-28 DIAGNOSIS — R0609 Other forms of dyspnea: Secondary | ICD-10-CM | POA: Diagnosis not present

## 2018-06-28 DIAGNOSIS — Z9484 Stem cells transplant status: Secondary | ICD-10-CM | POA: Diagnosis not present

## 2018-06-28 DIAGNOSIS — E8581 Light chain (AL) amyloidosis: Secondary | ICD-10-CM | POA: Diagnosis not present

## 2018-06-28 DIAGNOSIS — I2699 Other pulmonary embolism without acute cor pulmonale: Secondary | ICD-10-CM | POA: Diagnosis not present

## 2018-06-28 DIAGNOSIS — R5383 Other fatigue: Secondary | ICD-10-CM | POA: Diagnosis not present

## 2018-06-28 DIAGNOSIS — Z7901 Long term (current) use of anticoagulants: Secondary | ICD-10-CM | POA: Diagnosis not present

## 2018-06-28 DIAGNOSIS — I5032 Chronic diastolic (congestive) heart failure: Secondary | ICD-10-CM | POA: Diagnosis not present

## 2018-06-28 DIAGNOSIS — Z9481 Bone marrow transplant status: Secondary | ICD-10-CM | POA: Diagnosis not present

## 2018-07-03 ENCOUNTER — Other Ambulatory Visit: Payer: Medicare Other

## 2018-07-03 ENCOUNTER — Inpatient Hospital Stay: Payer: Medicare Other | Attending: Oncology | Admitting: Oncology

## 2018-07-03 ENCOUNTER — Telehealth: Payer: Self-pay | Admitting: Oncology

## 2018-07-03 VITALS — BP 117/68 | HR 63 | Temp 98.8°F | Resp 18 | Ht 72.0 in | Wt 206.6 lb

## 2018-07-03 DIAGNOSIS — I10 Essential (primary) hypertension: Secondary | ICD-10-CM | POA: Insufficient documentation

## 2018-07-03 DIAGNOSIS — Z7901 Long term (current) use of anticoagulants: Secondary | ICD-10-CM | POA: Diagnosis not present

## 2018-07-03 DIAGNOSIS — C9000 Multiple myeloma not having achieved remission: Secondary | ICD-10-CM | POA: Diagnosis not present

## 2018-07-03 DIAGNOSIS — E8581 Light chain (AL) amyloidosis: Secondary | ICD-10-CM | POA: Insufficient documentation

## 2018-07-03 DIAGNOSIS — Z86718 Personal history of other venous thrombosis and embolism: Secondary | ICD-10-CM | POA: Insufficient documentation

## 2018-07-03 DIAGNOSIS — R5381 Other malaise: Secondary | ICD-10-CM | POA: Diagnosis not present

## 2018-07-03 NOTE — Telephone Encounter (Signed)
Appts scheduled AVS/Calendar printed per 8/12 los °

## 2018-07-03 NOTE — Progress Notes (Signed)
[place] OFFICE PROGRESS NOTE   Diagnosis: Amyloidosis  INTERVAL HISTORY:   Brent Allison returns as scheduled.  He continues to have malaise.  He is able to go about his usual activities.  Good appetite.  He reports intentional weight loss.  He notes increased "soreness "diffusely when his bones are touched. He was seen at [facility] on 06/28/2018.  A 24-hour urine immunofixation revealed monoclonal lambda light chains.  Objective:  Vital signs in last 24 hours:  Blood pressure 117/68, pulse 63, temperature 98.8 F (37.1 C), temperature source Oral, resp. rate 18, height 6' (1.829 m), weight 206 lb 9.6 oz (93.7 kg), SpO2 99 %.    HEENT: Macroglossia, small ecchymosis of the posterior/lateral right tongue Resp: Lungs clear bilaterally Cardio: Regular rate and rhythm GI: No hepatospleno megaly, nontender, soft Vascular: Trace edema at the right lower leg and ankle  Skin: No ecchymoses    Lab Results: [facility] 06/28/2018: Hemoglobin 13.7, platelets 197,000, white count 7.4 Creatinine 1.4, Free light chains 1.85, lambda free light chains 4.94 Serum immunofixation- monoclonal IgG kappa Imaging:  No results found.  Medications: I have reviewed the patient's current medications.   Assessment/Plan: 1. 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 2. "Bruising" at the eyelids and mouth: Likely related to amyloidosis, persistent. 3. Numbness and loss of vibratory sense at the fingertips: This predated Velcade-based therapy, but worsened. Now much improved following cervical spine surgery. 4. Elevated serum free lambda light chains. The lambda light chains were lower on November 16 and slightly higher on 12/14/2011. 5. Lambda light chain proteinuria. 6. Bone marrow plasmacytosis: Variable increase in plasma cells noted on the bone marrow biopsy 07/29/2011 with plasma cells estimated to represent between 4% and 20% of the cellular population. 7. Remote history of prostate  cancer. 8. Sleep apnea. 9. Dyslipidemia. 10. Report of pneumonia on 2 occasions in 2011. 11. Admission to a hospital in [place], [place] October 2012 with "pneumonia." A chest x-ray at [facility] on November 2 was negative .  12. Low serum immunoglobulin G level. 13. Plasma cell dyscrasia with associated amyloidosis.  1. Initiation of systemic therapy with Cytoxan, Velcade, and Decadron 08/12/2011. Cycle #2 was initiated on 09/16/2011. Cycle #3 was initiated on 10/15/2011. Velcade was placed on hold due to neuropathy. 2. The serum free lambda light chains were decreased on October 08 2011. 3. The serum free lambda light chains were slightly increased on 12/14/2011, lower on 12/29/2011 and 02/02/2012. 4. Initiation of Revlimid/Decadron February 2013, cycle 2 started on 01/28/2012. 5. High-dose melphalan chemotherapy followed by autologous stem cell infusion on 02/09/2013 6. Initiation of Velcade/Decadrontherapy 05/28/2013, he completed 2 cycles on a day 1, 4, 8, 11 schedule 7. Improvement in the serum free lambda light chains following induction Velcade/Decadron  8. Initiation of weekly Velcade/Decadron on 07/16/2013, last treatment on 10/16/2013 9. Improvement in the serum free lambda light chains on 08/21/2013 and 09/18/2013 ([facility]) 10. Normal serum free lambda light chains on 12/13/2013 11. Mild increase of the serum free lambda light chains beginning January 2016-stable 12. treatment on the Pronto study at [place] beginning 12/17/2015, in of study 11/23/2016  Started on phase 2b Prothenastudy-long-term safety/efficacy study 01/11/2017,discontinued May 2018  Doxycycline started 05/17/2017 15. Loss of "balance "and proximal motor weakness secondary to cervical stenosis-status post decompression surgery on 02/19/2012. Improved, but not resolved stenosis with mass effect on the spinal cord noted on a repeat MRI 03/28/2012. He underwent further decompression surgery on 04/06/2012.  The ataxia, weakness, and peripheral numbness is  much improved.  16. Right lower Extremity deep vein thrombosis 02/02/2012-a Doppler ultrasound confirmed a gastrocnemius and peroneal deep vein thrombosis. An IVC filter was placed prior to surgery and he was maintained on Lovenox. The IVC filter was removed on 05/04/2012.  17. Anorexia following cervical spine surgery. Improved.  18. Episode of flank pain and hematuria in 03/31/2012-etiology unclear. He completed a course of antibiotics. The hematuria and pain have resolved.  19. Rectal wall thickening noted on a CT of the pelvis 03/31/2012.  20. colonoscopy showed an area of abnormality at the rectum with biopsy positive for amyloid.  21. Lung nodule noted on the CT 03/31/2012 , no lung nodule on a chest CT 07/30/2013  22. Lumbar stenosis-status post surgery on 07/31/2012.  23. Status post stem cell collection.  24.Rright internal jugular thrombosis, 10/22/2012, likely related to pheresis catheter and HIT  25. Admission with acute right pulmonary embolism 10/27/2012  26. Superior mesenteric vein/portal vein thrombosis diagnosed on a CT of the abdomen 10/28/2012 and 10/29/2012-treated with Argatroban , maintained on Coumadin anticoagulation until he hospital admission March 2014 for stem cell therapy  27. HIT confirmed on a serotonin release assay 10/29/2012  28. History of Renal insufficiency during the December 2013 hospital admission-likely related to dehydration and? Contrast nephropathy , persistent mild elevation of the creatinine  29. Dysphagia-? Secondary to amyloidosis, reported when he was here on 05/08/2013. He did not complain of dysphagia today  30. Anemia secondary to multiple myeloma and chemotherapy , improved  31.right lower Extremity deep vein thrombosis diagnosed at [facility] July 2014 , repeat Doppler at [facility] 07/19/2013 with chronic thrombus in the right gastrocnemius, Eliquis has been discontinued  32. Fall August 2014  with a left shoulder injury, diagnosed with a rotator cuff tear, status post surgical repair 09/19/2013  33. 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 The neuropathy symptoms have partially improved since Velcade was discontinued after treatment 10/16/2013  34. Cardiac MRI 05/22/2015-normal left ventricular size with severe concentric hypertrophy and mildly impaired systolic function, [id number]-44%, right ventricular hypertrophy, diffuse late gadolinium enhancement in the left and right ventricles-findings consistent with cardiac amyloidosis. Echocardiogram at [facility]-LVEF 50% 35. Multinodular goiter confirmed on thyroid ultrasound at [facility] 06/29/2016, amyloidosis of the thyroid confirmed at [place]     Disposition: BrentAllison is stable from a hematologic standpoint.  He continues follow-up in the myeloma program at [facility] and with cardiology at [facility].  The restaging laboratory evaluation at [facility] revealed no progression of the myeloma/amyloidosis.  We will schedule him for a metastatic bone survey to follow-up on the bone soreness.  He continues cardiology follow-up at [facility].  The plan is to continue observation for the myeloma/amyloidosis.  Brent Allison will return for a bone survey and influenza vaccine in October.  He will be scheduled for office visit in early February.  He will see Dr. Amalia Hailey in the interim.  Betsy Coder, MD  07/03/2018  10:44 AM

## 2018-07-10 DIAGNOSIS — B029 Zoster without complications: Secondary | ICD-10-CM | POA: Diagnosis not present

## 2018-07-31 DIAGNOSIS — H2513 Age-related nuclear cataract, bilateral: Secondary | ICD-10-CM | POA: Diagnosis not present

## 2018-07-31 DIAGNOSIS — H524 Presbyopia: Secondary | ICD-10-CM | POA: Diagnosis not present

## 2018-09-06 ENCOUNTER — Ambulatory Visit (HOSPITAL_COMMUNITY)
Admission: RE | Admit: 2018-09-06 | Discharge: 2018-09-06 | Disposition: A | Discharge status: Home / Self Care | Payer: Medicare Other | Source: Ambulatory Visit | Attending: Oncology | Admitting: Oncology

## 2018-09-06 DIAGNOSIS — M898X9 Other specified disorders of bone, unspecified site: Secondary | ICD-10-CM | POA: Diagnosis not present

## 2018-09-06 DIAGNOSIS — C9000 Multiple myeloma not having achieved remission: Secondary | ICD-10-CM

## 2018-09-06 DIAGNOSIS — C9001 Multiple myeloma in remission: Secondary | ICD-10-CM | POA: Diagnosis not present

## 2018-09-11 ENCOUNTER — Other Ambulatory Visit: Payer: Self-pay | Admitting: Pharmacist

## 2018-09-11 ENCOUNTER — Inpatient Hospital Stay: Payer: Medicare Other | Attending: Oncology

## 2018-09-11 DIAGNOSIS — Z23 Encounter for immunization: Secondary | ICD-10-CM

## 2018-09-11 DIAGNOSIS — C9000 Multiple myeloma not having achieved remission: Secondary | ICD-10-CM

## 2018-09-11 MED ORDER — INFLUENZA VAC SPLIT QUAD 0.5 ML IM SUSY
0.5000 mL | PREFILLED_SYRINGE | Freq: Once | INTRAMUSCULAR | Status: AC
  Administered 2018-09-11: 0.5 mL via INTRAMUSCULAR

## 2018-09-11 NOTE — Patient Instructions (Signed)

## 2018-09-11 NOTE — Addendum Note (Signed)
Addended by: Cora Collum on: 09/11/2018 10:06 AM   Modules accepted: Orders

## 2018-10-09 DIAGNOSIS — I5032 Chronic diastolic (congestive) heart failure: Secondary | ICD-10-CM | POA: Diagnosis not present

## 2018-10-09 DIAGNOSIS — D7582 Heparin induced thrombocytopenia (HIT): Secondary | ICD-10-CM | POA: Diagnosis not present

## 2018-10-09 DIAGNOSIS — Z6827 Body mass index (BMI) 27.0-27.9, adult: Secondary | ICD-10-CM | POA: Diagnosis not present

## 2018-10-09 DIAGNOSIS — R0609 Other forms of dyspnea: Secondary | ICD-10-CM | POA: Diagnosis not present

## 2018-10-09 DIAGNOSIS — E8581 Light chain (AL) amyloidosis: Secondary | ICD-10-CM | POA: Diagnosis not present

## 2018-10-09 DIAGNOSIS — G4730 Sleep apnea, unspecified: Secondary | ICD-10-CM | POA: Diagnosis not present

## 2018-10-09 DIAGNOSIS — Z9481 Bone marrow transplant status: Secondary | ICD-10-CM | POA: Diagnosis not present

## 2018-12-25 ENCOUNTER — Inpatient Hospital Stay: Payer: Medicare Other | Attending: Oncology | Admitting: Oncology

## 2018-12-25 ENCOUNTER — Telehealth: Payer: Self-pay

## 2018-12-25 VITALS — BP 129/73 | HR 66 | Temp 98.2°F | Resp 18 | Ht 72.0 in | Wt 206.2 lb

## 2018-12-25 DIAGNOSIS — Z7901 Long term (current) use of anticoagulants: Secondary | ICD-10-CM | POA: Diagnosis not present

## 2018-12-25 DIAGNOSIS — G629 Polyneuropathy, unspecified: Secondary | ICD-10-CM

## 2018-12-25 DIAGNOSIS — Z86711 Personal history of pulmonary embolism: Secondary | ICD-10-CM | POA: Diagnosis not present

## 2018-12-25 DIAGNOSIS — E8581 Light chain (AL) amyloidosis: Secondary | ICD-10-CM | POA: Diagnosis not present

## 2018-12-25 DIAGNOSIS — C9000 Multiple myeloma not having achieved remission: Secondary | ICD-10-CM

## 2018-12-25 NOTE — Telephone Encounter (Signed)
Printed avs and calender of upcoming appointment. Per 2/3 los 

## 2018-12-25 NOTE — Progress Notes (Signed)
[place] OFFICE PROGRESS NOTE   Diagnosis: Amyloidosis  INTERVAL HISTORY:   Brent Allison returns for a scheduled visit.  No recent infection.  No new complaint.  He has mild numbness and tingling in the extremities.  This does not interfere with activity.  He is scheduled to see Dr. Amalia Hailey in approximately 2 weeks.  Objective:  Vital signs in last 24 hours:  Blood pressure 129/73, pulse 66, temperature 98.2 F (36.8 C), temperature source Oral, resp. rate 18, height 6' (1.829 m), weight 206 lb 3.2 oz (93.5 kg), SpO2 100 %.    HEENT: Macroglossia, small ecchymoses at the upper eyelids Resp: Lungs clear bilaterally Cardio: Regular rate and rhythm GI: No hepatosplenomegaly Vascular: No leg edema, the right lower leg is slightly larger than the left side   Lab Results:  Lab Results  Component Value Date   WBC 7.1 04/07/2018   HGB 13.6 04/07/2018   HCT 40.6 04/07/2018   MCV 96.4 04/07/2018   PLT 154 04/07/2018   NEUTROABS 4.6 04/07/2018    CMP  Lab Results  Component Value Date   NA 138 05/10/2018   K 4.3 05/10/2018   CL 107 05/10/2018   CO2 25 05/10/2018   GLUCOSE 95 05/10/2018   BUN 21 05/10/2018   CREATININE 1.36 (H) 05/10/2018   CALCIUM 9.1 05/10/2018   PROT 6.4 04/07/2018   ALBUMIN 3.6 04/07/2018   AST 19 04/07/2018   ALT 17 04/07/2018   ALKPHOS 97 04/07/2018   BILITOT 0.5 04/07/2018   GFRNONAA 50 (L) 05/10/2018   GFRAA 58 (L) 05/10/2018     Medications: I have reviewed the patient's current medications.   Assessment/Plan: 1. 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 2. "Bruising" at the eyelids and mouth: Likely related to amyloidosis, persistent. 3. Numbness and loss of vibratory sense at the fingertips: This predated Velcade-based therapy, but worsened. Now much improved following cervical spine surgery. 4. Elevated serum free lambda light chains. The lambda light chains were lower on November 16 and slightly higher on  12/14/2011. 5. Lambda light chain proteinuria. 6. Bone marrow plasmacytosis: Variable increase in plasma cells noted on the bone marrow biopsy 07/29/2011 with plasma cells estimated to represent between 4% and 20% of the cellular population. 7. Remote history of prostate cancer. 8. Sleep apnea. 9. Dyslipidemia. 10. Report of pneumonia on 2 occasions in 2011. 11. Admission to a hospital in [place], [place] October 2012 with "pneumonia." A chest x-ray at [facility] on November 2 was negative .  12. Low serum immunoglobulin G level. 13. Plasma cell dyscrasia with associated amyloidosis.  1. Initiation of systemic therapy with Cytoxan, Velcade, and Decadron 08/12/2011. Cycle #2 was initiated on 09/16/2011. Cycle #3 was initiated on 10/15/2011. Velcade was placed on hold due to neuropathy. 2. The serum free lambda light chains were decreased on October 08 2011. 3. The serum free lambda light chains were slightly increased on 12/14/2011, lower on 12/29/2011 and 02/02/2012. 4. Initiation of Revlimid/Decadron February 2013, cycle 2 started on 01/28/2012. 5. High-dose melphalan chemotherapy followed by autologous stem cell infusion on 02/09/2013 6. Initiation of Velcade/Decadrontherapy 05/28/2013, he completed 2 cycles on a day 1, 4, 8, 11 schedule 7. Improvement in the serum free lambda light chains following induction Velcade/Decadron  8. Initiation of weekly Velcade/Decadron on 07/16/2013, last treatment on 10/16/2013 9. Improvement in the serum free lambda light chains on 08/21/2013 and 09/18/2013 ([facility]) 10. Normal serum free lambda light chains on 12/13/2013 11. Mild increase of the serum  free lambda light chains beginning January 2016-stable 12. treatment on the Pronto study at [place] beginning 12/17/2015, in of study 11/23/2016  Started on phase 2b Prothenastudy-long-term safety/efficacy study 01/11/2017,discontinued May 2018  Doxycycline started 05/17/2017 15. Loss of "balance  "and proximal motor weakness secondary to cervical stenosis-status post decompression surgery on 02/19/2012. Improved, but not resolved stenosis with mass effect on the spinal cord noted on a repeat MRI 03/28/2012. He underwent further decompression surgery on 04/06/2012. The ataxia, weakness, and peripheral numbness is much improved.  16. Right lower Extremity deep vein thrombosis 02/02/2012-a Doppler ultrasound confirmed a gastrocnemius and peroneal deep vein thrombosis. An IVC filter was placed prior to surgery and he was maintained on Lovenox. The IVC filter was removed on 05/04/2012.  17. Anorexia following cervical spine surgery. Improved.  18. Episode of flank pain and hematuria in 03/31/2012-etiology unclear. He completed a course of antibiotics. The hematuria and pain have resolved.  19. Rectal wall thickening noted on a CT of the pelvis 03/31/2012.  20. colonoscopy showed an area of abnormality at the rectum with biopsy positive for amyloid.  21. Lung nodule noted on the CT 03/31/2012 , no lung nodule on a chest CT 07/30/2013  22. Lumbar stenosis-status post surgery on 07/31/2012.  23. Status post stem cell collection.  24.Rright internal jugular thrombosis, 10/22/2012, likely related to pheresis catheter and HIT  25. Admission with acute right pulmonary embolism 10/27/2012  26. Superior mesenteric vein/portal vein thrombosis diagnosed on a CT of the abdomen 10/28/2012 and 10/29/2012-treated with Argatroban , maintained on Coumadin anticoagulation until he hospital admission March 2014 for stem cell therapy  27. HIT confirmed on a serotonin release assay 10/29/2012  28. History of Renal insufficiency during the December 2013 hospital admission-likely related to dehydration and? Contrast nephropathy , persistent mild elevation of the creatinine  29. Dysphagia-? Secondary to amyloidosis, reported when he was here on 05/08/2013. He did not complain of dysphagia today  30. Anemia  secondary to multiple myeloma and chemotherapy , improved  31.right lower Extremity deep vein thrombosis diagnosed at [facility] July 2014 , repeat Doppler at [facility] 07/19/2013 with chronic thrombus in the right gastrocnemius, Eliquis has been discontinued  32. Fall August 2014 with a left shoulder injury, diagnosed with a rotator cuff tear, status post surgical repair 09/19/2013  33. 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 The neuropathy symptoms have partially improved since Velcade was discontinued after treatment 10/16/2013  34. Cardiac MRI 05/22/2015-normal left ventricular size with severe concentric hypertrophy and mildly impaired systolic function, MMHW-80%, right ventricular hypertrophy, diffuse late gadolinium enhancement in the left and right ventricles-findings consistent with cardiac amyloidosis. Echocardiogram at [facility]-LVEF 50%  Echocardiogram at [facility] 32,019- left ventricular hypertrophy, LVEF 55-60% 35. Multinodular goiter confirmed on thyroid ultrasound at [facility] 06/29/2016, amyloidosis of the thyroid confirmed at [facility] 04/20/2018   Disposition: Mr. Brandis appears unchanged.  He is scheduled to see Dr. Amalia Hailey for a lab and office visit in approximately 2 weeks.  He continues cardiology follow-up at [facility].  He will return for an office visit in 6 months.  I am available to see him in the interim as needed.  Brent Coder, MD  12/25/2018  9:59 AM

## 2018-12-25 NOTE — Patient Instructions (Signed)
Please provide a copy of your Medical Advanced Directive/Living Will to have scanned into your medical record.

## 2019-01-10 DIAGNOSIS — R233 Spontaneous ecchymoses: Secondary | ICD-10-CM | POA: Diagnosis not present

## 2019-01-10 DIAGNOSIS — Z9484 Stem cells transplant status: Secondary | ICD-10-CM | POA: Diagnosis not present

## 2019-01-10 DIAGNOSIS — Z79899 Other long term (current) drug therapy: Secondary | ICD-10-CM | POA: Diagnosis not present

## 2019-01-10 DIAGNOSIS — Z888 Allergy status to other drugs, medicaments and biological substances status: Secondary | ICD-10-CM | POA: Diagnosis not present

## 2019-01-10 DIAGNOSIS — E854 Organ-limited amyloidosis: Secondary | ICD-10-CM | POA: Diagnosis not present

## 2019-01-10 DIAGNOSIS — G6289 Other specified polyneuropathies: Secondary | ICD-10-CM | POA: Diagnosis not present

## 2019-01-10 DIAGNOSIS — E8581 Light chain (AL) amyloidosis: Secondary | ICD-10-CM | POA: Diagnosis not present

## 2019-01-10 DIAGNOSIS — R0609 Other forms of dyspnea: Secondary | ICD-10-CM | POA: Diagnosis not present

## 2019-01-10 DIAGNOSIS — Z9221 Personal history of antineoplastic chemotherapy: Secondary | ICD-10-CM | POA: Diagnosis not present

## 2019-01-10 DIAGNOSIS — R5382 Chronic fatigue, unspecified: Secondary | ICD-10-CM | POA: Diagnosis not present

## 2019-01-10 DIAGNOSIS — I43 Cardiomyopathy in diseases classified elsewhere: Secondary | ICD-10-CM | POA: Diagnosis not present

## 2019-01-10 DIAGNOSIS — R799 Abnormal finding of blood chemistry, unspecified: Secondary | ICD-10-CM | POA: Diagnosis not present

## 2019-01-10 DIAGNOSIS — Z6828 Body mass index (BMI) 28.0-28.9, adult: Secondary | ICD-10-CM | POA: Diagnosis not present

## 2019-01-15 DIAGNOSIS — L820 Inflamed seborrheic keratosis: Secondary | ICD-10-CM | POA: Diagnosis not present

## 2019-01-15 DIAGNOSIS — D485 Neoplasm of uncertain behavior of skin: Secondary | ICD-10-CM | POA: Diagnosis not present

## 2019-01-15 DIAGNOSIS — L570 Actinic keratosis: Secondary | ICD-10-CM | POA: Diagnosis not present

## 2019-01-15 DIAGNOSIS — Z85828 Personal history of other malignant neoplasm of skin: Secondary | ICD-10-CM | POA: Diagnosis not present

## 2019-01-15 DIAGNOSIS — B078 Other viral warts: Secondary | ICD-10-CM | POA: Diagnosis not present

## 2019-04-12 DIAGNOSIS — J343 Hypertrophy of nasal turbinates: Secondary | ICD-10-CM | POA: Diagnosis not present

## 2019-04-12 DIAGNOSIS — H903 Sensorineural hearing loss, bilateral: Secondary | ICD-10-CM | POA: Diagnosis not present

## 2019-04-12 DIAGNOSIS — H9221 Otorrhagia, right ear: Secondary | ICD-10-CM | POA: Diagnosis not present

## 2019-04-12 DIAGNOSIS — H6121 Impacted cerumen, right ear: Secondary | ICD-10-CM | POA: Diagnosis not present

## 2019-04-12 DIAGNOSIS — Z87891 Personal history of nicotine dependence: Secondary | ICD-10-CM | POA: Diagnosis not present

## 2019-04-12 DIAGNOSIS — Z9089 Acquired absence of other organs: Secondary | ICD-10-CM | POA: Diagnosis not present

## 2019-04-12 DIAGNOSIS — H9311 Tinnitus, right ear: Secondary | ICD-10-CM | POA: Diagnosis not present

## 2019-04-17 ENCOUNTER — Telehealth: Payer: Self-pay | Admitting: *Deleted

## 2019-04-17 NOTE — Telephone Encounter (Addendum)
Patient has called requesting an appointment to see Dr. [name]. Reports over last 2 months his breathing has become progressively worse with shortness of breath on exertion. Also has noted a generalized weakness as well. Feels he has been bruising more easily as well. Reports being on doxycycline bid for past month at direction of Dr. Amalia Hailey at [facility]. Sees Dr. Karle Barr (cardiology) at [facility] in July.  Called patient on 04/18/19 with appointment to see Dr. [name] tomorrow at 12:00. He will come.

## 2019-04-19 ENCOUNTER — Inpatient Hospital Stay: Payer: Medicare Other | Admitting: Oncology

## 2019-04-20 ENCOUNTER — Ambulatory Visit: Payer: Medicare Other | Admitting: Oncology

## 2019-04-24 ENCOUNTER — Other Ambulatory Visit: Payer: Self-pay

## 2019-04-24 ENCOUNTER — Ambulatory Visit (HOSPITAL_COMMUNITY)
Admission: RE | Admit: 2019-04-24 | Discharge: 2019-04-24 | Disposition: A | Discharge status: Home / Self Care | Payer: Medicare Other | Source: Ambulatory Visit | Attending: Oncology | Admitting: Oncology

## 2019-04-24 ENCOUNTER — Inpatient Hospital Stay: Payer: Medicare Other | Attending: Oncology | Admitting: Oncology

## 2019-04-24 ENCOUNTER — Telehealth: Payer: Self-pay | Admitting: Oncology

## 2019-04-24 ENCOUNTER — Inpatient Hospital Stay: Payer: Medicare Other

## 2019-04-24 VITALS — BP 117/58 | HR 61 | Temp 98.2°F | Resp 18 | Ht 72.0 in | Wt 199.9 lb

## 2019-04-24 DIAGNOSIS — E042 Nontoxic multinodular goiter: Secondary | ICD-10-CM | POA: Insufficient documentation

## 2019-04-24 DIAGNOSIS — R630 Anorexia: Secondary | ICD-10-CM | POA: Diagnosis not present

## 2019-04-24 DIAGNOSIS — Z8546 Personal history of malignant neoplasm of prostate: Secondary | ICD-10-CM | POA: Diagnosis not present

## 2019-04-24 DIAGNOSIS — S00532A Contusion of oral cavity, initial encounter: Secondary | ICD-10-CM | POA: Diagnosis not present

## 2019-04-24 DIAGNOSIS — Z86718 Personal history of other venous thrombosis and embolism: Secondary | ICD-10-CM | POA: Diagnosis not present

## 2019-04-24 DIAGNOSIS — R0600 Dyspnea, unspecified: Secondary | ICD-10-CM | POA: Diagnosis not present

## 2019-04-24 DIAGNOSIS — G4730 Sleep apnea, unspecified: Secondary | ICD-10-CM | POA: Insufficient documentation

## 2019-04-24 DIAGNOSIS — S0010XA Contusion of unspecified eyelid and periocular area, initial encounter: Secondary | ICD-10-CM | POA: Diagnosis not present

## 2019-04-24 DIAGNOSIS — G629 Polyneuropathy, unspecified: Secondary | ICD-10-CM | POA: Insufficient documentation

## 2019-04-24 DIAGNOSIS — Z86711 Personal history of pulmonary embolism: Secondary | ICD-10-CM | POA: Insufficient documentation

## 2019-04-24 DIAGNOSIS — D72822 Plasmacytosis: Secondary | ICD-10-CM | POA: Diagnosis not present

## 2019-04-24 DIAGNOSIS — C9000 Multiple myeloma not having achieved remission: Secondary | ICD-10-CM | POA: Insufficient documentation

## 2019-04-24 DIAGNOSIS — J189 Pneumonia, unspecified organism: Secondary | ICD-10-CM | POA: Diagnosis not present

## 2019-04-24 DIAGNOSIS — E785 Hyperlipidemia, unspecified: Secondary | ICD-10-CM | POA: Insufficient documentation

## 2019-04-24 DIAGNOSIS — R809 Proteinuria, unspecified: Secondary | ICD-10-CM | POA: Insufficient documentation

## 2019-04-24 DIAGNOSIS — E8581 Light chain (AL) amyloidosis: Secondary | ICD-10-CM | POA: Diagnosis not present

## 2019-04-24 DIAGNOSIS — Z7901 Long term (current) use of anticoagulants: Secondary | ICD-10-CM | POA: Insufficient documentation

## 2019-04-24 DIAGNOSIS — I517 Cardiomegaly: Secondary | ICD-10-CM | POA: Diagnosis not present

## 2019-04-24 DIAGNOSIS — R0609 Other forms of dyspnea: Secondary | ICD-10-CM | POA: Diagnosis not present

## 2019-04-24 DIAGNOSIS — D630 Anemia in neoplastic disease: Secondary | ICD-10-CM | POA: Insufficient documentation

## 2019-04-24 DIAGNOSIS — R270 Ataxia, unspecified: Secondary | ICD-10-CM | POA: Insufficient documentation

## 2019-04-24 LAB — CBC WITH DIFFERENTIAL (CANCER CENTER ONLY)
Abs Immature Granulocytes: 0.02 10*3/uL (ref 0.00–0.07)
Basophils Absolute: 0 10*3/uL (ref 0.0–0.1)
Basophils Relative: 1 %
Eosinophils Absolute: 0.1 10*3/uL (ref 0.0–0.5)
Eosinophils Relative: 2 %
HCT: 41 % (ref 39.0–52.0)
Hemoglobin: 13.3 g/dL (ref 13.0–17.0)
Immature Granulocytes: 0 %
Lymphocytes Relative: 21 %
Lymphs Abs: 1.2 10*3/uL (ref 0.7–4.0)
MCH: 32 pg (ref 26.0–34.0)
MCHC: 32.4 g/dL (ref 30.0–36.0)
MCV: 98.6 fL (ref 80.0–100.0)
Monocytes Absolute: 0.6 10*3/uL (ref 0.1–1.0)
Monocytes Relative: 10 %
Neutro Abs: 4 10*3/uL (ref 1.7–7.7)
Neutrophils Relative %: 66 %
Platelet Count: 167 10*3/uL (ref 150–400)
RBC: 4.16 MIL/uL — ABNORMAL LOW (ref 4.22–5.81)
RDW: 13.4 % (ref 11.5–15.5)
WBC Count: 6 10*3/uL (ref 4.0–10.5)
nRBC: 0 % (ref 0.0–0.2)

## 2019-04-24 LAB — CMP (CANCER CENTER ONLY)
ALT: 12 U/L (ref 0–44)
AST: 16 U/L (ref 15–41)
Albumin: 3.3 g/dL — ABNORMAL LOW (ref 3.5–5.0)
Alkaline Phosphatase: 92 U/L (ref 38–126)
Anion gap: 7 (ref 5–15)
BUN: 20 mg/dL (ref 8–23)
CO2: 26 mmol/L (ref 22–32)
Calcium: 8.8 mg/dL — ABNORMAL LOW (ref 8.9–10.3)
Chloride: 107 mmol/L (ref 98–111)
Creatinine: 1.32 mg/dL — ABNORMAL HIGH (ref 0.61–1.24)
GFR, Est AFR Am: >60 mL/min (ref >60)
GFR, Estimated: 53 mL/min — ABNORMAL LOW (ref >60)
Glucose, Bld: 102 mg/dL — ABNORMAL HIGH (ref 70–99)
Potassium: 4.7 mmol/L (ref 3.5–5.1)
Sodium: 140 mmol/L (ref 135–145)
Total Bilirubin: 0.4 mg/dL (ref 0.3–1.2)
Total Protein: 6.3 g/dL — ABNORMAL LOW (ref 6.5–8.1)

## 2019-04-24 NOTE — Telephone Encounter (Signed)
Per 6/2 los F/u as scheduled

## 2019-04-24 NOTE — Progress Notes (Signed)
Brent Allison   Diagnosis: Amyloidosis  INTERVAL HISTORY:   Brent Allison returns prior to a scheduled visit.  He reports exertional dyspnea for the past few months.  He continues to work in his yard.  Good appetite.  No fever, cough, or chest pain.  He bruises easily.  He has soreness with touching both legs.  He continues doxycycline.  Objective:  Vital signs in last 24 hours:  Blood pressure (!) 117/58, pulse 61, temperature 98.2 F (36.8 C), temperature source Oral, resp. rate 18, height 6' (1.829 m), weight 199 lb 14.4 oz (90.7 kg), SpO2 100 %.    Resp: Lungs clear bilaterally, no respiratory distress Cardio: Regular rate and rhythm GI: No hepatosplenomegaly Vascular: No leg edema, the right lower leg is slightly larger than the left side  Skin: Multiple small ecchymoses over the trunk and extremities    Lab Results:  Lab Results  Component Value Date   WBC 7.1 04/07/2018   HGB 13.6 04/07/2018   HCT 40.6 04/07/2018   MCV 96.4 04/07/2018   PLT 154 04/07/2018   NEUTROABS 4.6 04/07/2018    CMP  Lab Results  Component Value Date   NA 138 05/10/2018   K 4.3 05/10/2018   CL 107 05/10/2018   CO2 25 05/10/2018   GLUCOSE 95 05/10/2018   BUN 21 05/10/2018   CREATININE 1.36 (H) 05/10/2018   CALCIUM 9.1 05/10/2018   PROT 6.4 04/07/2018   ALBUMIN 3.6 04/07/2018   AST 19 04/07/2018   ALT 17 04/07/2018   ALKPHOS 97 04/07/2018   BILITOT 0.5 04/07/2018   GFRNONAA 50 (L) 05/10/2018   GFRAA 58 (L) 05/10/2018     Medications: I have reviewed the patient's current medications.   Assessment/Plan: 1. 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 2. "Bruising" at the eyelids and mouth: Likely related to amyloidosis, persistent. 3. Numbness and loss of vibratory sense at the fingertips: This predated Velcade-based therapy, but worsened. Now much improved following cervical spine surgery. 4. Elevated serum free lambda light chains. The  lambda light chains were lower on November 16 and slightly higher on 12/14/2011. 5. Lambda light chain proteinuria. 6. Bone marrow plasmacytosis: Variable increase in plasma cells noted on the bone marrow biopsy 07/29/2011 with plasma cells estimated to represent between 4% and 20% of the cellular population. 7. Remote history of prostate cancer. 8. Sleep apnea. 9. Dyslipidemia. 10. Report of pneumonia on 2 occasions in 2011. 11. Admission to a hospital in [place], [place] October 2012 with "pneumonia." A chest x-ray at [facility] on November 2 was negative .  12. Low serum immunoglobulin G level. 13. Plasma cell dyscrasia with associated amyloidosis.  1. Initiation of systemic therapy with Cytoxan, Velcade, and Decadron 08/12/2011. Cycle #2 was initiated on 09/16/2011. Cycle #3 was initiated on 10/15/2011. Velcade was placed on hold due to neuropathy. 2. The serum free lambda light chains were decreased on October 08 2011. 3. The serum free lambda light chains were slightly increased on 12/14/2011, lower on 12/29/2011 and 02/02/2012. 4. Initiation of Revlimid/Decadron February 2013, cycle 2 started on 01/28/2012. 5. High-dose melphalan chemotherapy followed by autologous stem cell infusion on 02/09/2013 6. Initiation of Velcade/Decadrontherapy 05/28/2013, he completed 2 cycles on a day 1, 4, 8, 11 schedule 7. Improvement in the serum free lambda light chains following induction Velcade/Decadron  8. Initiation of weekly Velcade/Decadron on 07/16/2013, last treatment on 10/16/2013 9. Improvement in the serum free lambda light chains on 08/21/2013 and 09/18/2013 [facility])  10. Normal serum free lambda light chains on 12/13/2013 11. Mild increase of the serum free lambda light chains beginning January 2016-stable 12. treatment on the Pronto study at [place] beginning 12/17/2015, in of study 11/23/2016  Started on phase 2b Prothenastudy-long-term safety/efficacy study  01/11/2017,discontinued May 2018  Doxycycline started 05/17/2017 15. Loss of "balance "and proximal motor weakness secondary to cervical stenosis-status post decompression surgery on 02/19/2012. Improved, but not resolved stenosis with mass effect on the spinal cord noted on a repeat MRI 03/28/2012. He underwent further decompression surgery on 04/06/2012. The ataxia, weakness, and peripheral numbness is much improved.  16. Right lower Extremity deep vein thrombosis 02/02/2012-a Doppler ultrasound confirmed a gastrocnemius and peroneal deep vein thrombosis. An IVC filter was placed prior to surgery and he was maintained on Lovenox. The IVC filter was removed on 05/04/2012.  17. Anorexia following cervical spine surgery. Improved.  18. Episode of flank pain and hematuria in 03/31/2012-etiology unclear. He completed a course of antibiotics. The hematuria and pain have resolved.  19. Rectal wall thickening noted on a CT of the pelvis 03/31/2012.  20. colonoscopy showed an area of abnormality at the rectum with biopsy positive for amyloid.  21. Lung nodule noted on the CT 03/31/2012 , no lung nodule on a chest CT 07/30/2013  22. Lumbar stenosis-status post surgery on 07/31/2012.  23. Status post stem cell collection.  24.Rright internal jugular thrombosis, 10/22/2012, likely related to pheresis catheter and HIT  25. Admission with acute right pulmonary embolism 10/27/2012  26. Superior mesenteric vein/portal vein thrombosis diagnosed on a CT of the abdomen 10/28/2012 and 10/29/2012-treated with Argatroban , maintained on Coumadin anticoagulation until he hospital admission March 2014 for stem cell therapy  27. HIT confirmed on a serotonin release assay 10/29/2012  28. History of Renal insufficiency during the December 2013 hospital admission-likely related to dehydration and? Contrast nephropathy , persistent mild elevation of the creatinine  29. Dysphagia-? Secondary to amyloidosis,  reported when he was here on 05/08/2013. He did not complain of dysphagia today  30. Anemia secondary to multiple myeloma and chemotherapy , improved  31.right lower Extremity deep vein thrombosis diagnosed at [facility] July 2014 , repeat Doppler at [facility] 07/19/2013 with chronic thrombus in the right gastrocnemius, Eliquis has been discontinued  32. Fall August 2014 with a left shoulder injury, diagnosed with a rotator cuff tear, status post surgical repair 09/19/2013  33. 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 The neuropathy symptoms have partially improved since Velcade was discontinued after treatment 10/16/2013  34. Cardiac MRI 05/22/2015-normal left ventricular size with severe concentric hypertrophy and mildly impaired systolic function, [id number]-71%, right ventricular hypertrophy, diffuse late gadolinium enhancement in the left and right ventricles-findings consistent with cardiac amyloidosis. Echocardiogram at [facility]-LVEF 50%  Echocardiogram at [facility] 32,019- left ventricular hypertrophy, LVEF 55-60% 35. Multinodular goiter confirmed on thyroid ultrasound at [facility] 06/29/2016, amyloidosis of the thyroid confirmed at [facility] 04/20/2018    Disposition: Brent Allison has amyloidosis.  He presents today with exertional dyspnea.  I am concerned this could be related to the cardiac amyloidosis.  We will check a CBC, chemistry panel, myeloma panel, and pro BNP today.  He will be referred for a chest x-ray. I will contact Dr. Amalia Hailey to see if his appointments at [facility] can be moved to a sooner date.  I have a low clinical suspicion for an infection or pulmonary embolism.  Brent Allison will contact me if he develops increased dyspnea or new symptoms.  Betsy Coder, MD  04/24/2019  9:15 AM

## 2019-04-25 ENCOUNTER — Telehealth: Payer: Self-pay | Admitting: *Deleted

## 2019-04-25 LAB — KAPPA/LAMBDA LIGHT CHAINS
Kappa free light chain: 21.1 mg/L — ABNORMAL HIGH (ref 3.3–19.4)
Kappa, lambda light chain ratio: 0.37 (ref 0.26–1.65)
Lambda free light chains: 57.1 mg/L — ABNORMAL HIGH (ref 5.7–26.3)

## 2019-04-25 LAB — PROTEIN ELECTROPHORESIS, SERUM
A/G Ratio: 1.3 (ref 0.7–1.7)
Albumin ELP: 3.4 g/dL (ref 2.9–4.4)
Alpha-1-Globulin: 0.2 g/dL (ref 0.0–0.4)
Alpha-2-Globulin: 0.8 g/dL (ref 0.4–1.0)
Beta Globulin: 0.9 g/dL (ref 0.7–1.3)
Gamma Globulin: 0.9 g/dL (ref 0.4–1.8)
Globulin, Total: 2.7 g/dL (ref 2.2–3.9)
M-Spike, %: 0.2 g/dL — ABNORMAL HIGH
Total Protein ELP: 6.1 g/dL (ref 6.0–8.5)

## 2019-04-25 LAB — MISC LABCORP TEST (SEND OUT): Labcorp test code: 143000

## 2019-04-25 NOTE — Telephone Encounter (Signed)
Notified of CXR, and lab results. Pro-BNP is also normal per Dr. [name]. MD is sending results to his [facility] cardiologist. Informed him to call cardiology if the shortness of breath worsens.

## 2019-04-25 NOTE — Telephone Encounter (Signed)
-----   Message from Ladell Pier, MD sent at 04/24/2019  3:56 PM EDT ----- Please call patient, chest x-ray shows stable enlargement of the heart, no fluid in the lungs, CBC and chemistry panel are stable, waiting on blood heart test requested by Dr.s at [facility], other amyloid labs are pending  [facility] team will see him sooner if the heart test returns abnormal

## 2019-04-27 ENCOUNTER — Telehealth: Payer: Self-pay

## 2019-04-27 NOTE — Telephone Encounter (Signed)
-----   Message from Ladell Pier, MD sent at 04/27/2019  3:11 PM EDT ----- Please call patient, serum M spike and light chains are not changed significantly compared to [facility] from February, call for increased shortness of breath, follow-up as scheduled

## 2019-04-27 NOTE — Telephone Encounter (Signed)
Spoke with pt. Advised per Dr. [name] serum M spike and light chains are not significantly compared to [facility] from Feb. Call for increased shortness of breath, f/u as scheduled.

## 2019-04-30 DIAGNOSIS — Z85828 Personal history of other malignant neoplasm of skin: Secondary | ICD-10-CM | POA: Diagnosis not present

## 2019-04-30 DIAGNOSIS — L570 Actinic keratosis: Secondary | ICD-10-CM | POA: Diagnosis not present

## 2019-04-30 DIAGNOSIS — B078 Other viral warts: Secondary | ICD-10-CM | POA: Diagnosis not present

## 2019-06-25 ENCOUNTER — Telehealth: Payer: Self-pay | Admitting: Oncology

## 2019-06-25 ENCOUNTER — Inpatient Hospital Stay: Payer: Medicare Other | Attending: Oncology | Admitting: Oncology

## 2019-06-25 ENCOUNTER — Other Ambulatory Visit: Payer: Self-pay

## 2019-06-25 VITALS — BP 121/61 | HR 70 | Temp 98.2°F | Resp 17 | Ht 72.0 in | Wt 198.7 lb

## 2019-06-25 DIAGNOSIS — Z86718 Personal history of other venous thrombosis and embolism: Secondary | ICD-10-CM | POA: Insufficient documentation

## 2019-06-25 DIAGNOSIS — E785 Hyperlipidemia, unspecified: Secondary | ICD-10-CM | POA: Insufficient documentation

## 2019-06-25 DIAGNOSIS — C9000 Multiple myeloma not having achieved remission: Secondary | ICD-10-CM | POA: Insufficient documentation

## 2019-06-25 DIAGNOSIS — Z7901 Long term (current) use of anticoagulants: Secondary | ICD-10-CM | POA: Insufficient documentation

## 2019-06-25 DIAGNOSIS — E8581 Light chain (AL) amyloidosis: Secondary | ICD-10-CM | POA: Diagnosis not present

## 2019-06-25 DIAGNOSIS — G4730 Sleep apnea, unspecified: Secondary | ICD-10-CM | POA: Diagnosis not present

## 2019-06-25 DIAGNOSIS — Z8546 Personal history of malignant neoplasm of prostate: Secondary | ICD-10-CM | POA: Diagnosis not present

## 2019-06-25 DIAGNOSIS — Z86711 Personal history of pulmonary embolism: Secondary | ICD-10-CM | POA: Diagnosis not present

## 2019-06-25 DIAGNOSIS — Z79899 Other long term (current) drug therapy: Secondary | ICD-10-CM | POA: Diagnosis not present

## 2019-06-25 NOTE — Progress Notes (Signed)
[place] OFFICE PROGRESS NOTE   Diagnosis: Amyloidosis  INTERVAL HISTORY:   Brent Allison reports feeling better compared to when I saw him last month.  Good appetite.  He reports intentional weight loss.  He is working at his home.  He is caring for his daughter and wife.  His wife has been diagnosed with Alzheimer's dementia.  He has noted some sensitivity while on doxycycline.  Objective:  Vital signs in last 24 hours:  Blood pressure 121/61, pulse 70, temperature 98.2 F (36.8 C), temperature source Oral, resp. rate 17, height 6' (1.829 m), weight 198 lb 11.2 oz (90.1 kg), SpO2 99 %.    Resp: Lungs clear bilaterally Cardio: Regular rate and rhythm GI: No hepatosplenomegaly Vascular: No leg edema, the right lower leg is larger than the left side  Skin: Spooning of the fingernails   Lab Results:  Lab Results  Component Value Date   WBC 6.0 04/24/2019   HGB 13.3 04/24/2019   HCT 41.0 04/24/2019   MCV 98.6 04/24/2019   PLT 167 04/24/2019   NEUTROABS 4.0 04/24/2019    CMP  Lab Results  Component Value Date   NA 140 04/24/2019   K 4.7 04/24/2019   CL 107 04/24/2019   CO2 26 04/24/2019   GLUCOSE 102 (H) 04/24/2019   BUN 20 04/24/2019   CREATININE 1.32 (H) 04/24/2019   CALCIUM 8.8 (L) 04/24/2019   PROT 6.3 (L) 04/24/2019   ALBUMIN 3.3 (L) 04/24/2019   AST 16 04/24/2019   ALT 12 04/24/2019   ALKPHOS 92 04/24/2019   BILITOT 0.4 04/24/2019   GFRNONAA 53 (L) 04/24/2019   GFRAA >60 04/24/2019    Serum M spike 0.2, kappa free light chains 21, lambda free light chains 57 Imaging: 04/24/2019- stable cardiomegaly, no acute abnormality  Medications: I have reviewed the patient's current medications.   Assessment/Plan: 1. 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 2. "Bruising" at the eyelids and mouth: Likely related to amyloidosis, persistent. 3. Numbness and loss of vibratory sense at the fingertips: This predated Velcade-based therapy, but  worsened. Now much improved following cervical spine surgery. 4. Elevated serum free lambda light chains. The lambda light chains were lower on November 16 and slightly higher on 12/14/2011. 5. Lambda light chain proteinuria. 6. Bone marrow plasmacytosis: Variable increase in plasma cells noted on the bone marrow biopsy 07/29/2011 with plasma cells estimated to represent between 4% and 20% of the cellular population. 7. Remote history of prostate cancer. 8. Sleep apnea. 9. Dyslipidemia. 10. Report of pneumonia on 2 occasions in 2011. 11. Admission to a hospital in [place], [place] October 2012 with "pneumonia." A chest x-ray at [facility] on November 2 was negative .  12. Low serum immunoglobulin G level. 13. Plasma cell dyscrasia with associated amyloidosis.  1. Initiation of systemic therapy with Cytoxan, Velcade, and Decadron 08/12/2011. Cycle #2 was initiated on 09/16/2011. Cycle #3 was initiated on 10/15/2011. Velcade was placed on hold due to neuropathy. 2. The serum free lambda light chains were decreased on October 08 2011. 3. The serum free lambda light chains were slightly increased on 12/14/2011, lower on 12/29/2011 and 02/02/2012. 4. Initiation of Revlimid/Decadron February 2013, cycle 2 started on 01/28/2012. 5. High-dose melphalan chemotherapy followed by autologous stem cell infusion on 02/09/2013 6. Initiation of Velcade/Decadrontherapy 05/28/2013, he completed 2 cycles on a day 1, 4, 8, 11 schedule 7. Improvement in the serum free lambda light chains following induction Velcade/Decadron  8. Initiation of weekly Velcade/Decadron on 07/16/2013,  last treatment on 10/16/2013 9. Improvement in the serum free lambda light chains on 08/21/2013 and 09/18/2013 ([facility]) 10. Normal serum free lambda light chains on 12/13/2013 11. Mild increase of the serum free lambda light chains beginning January 2016-stable 12. treatment on the Pronto study at [place] beginning 12/17/2015, in of  study 11/23/2016  Started on phase 2b Prothenastudy-long-term safety/efficacy study 01/11/2017,discontinued May 2018  Doxycycline started 05/17/2017 15. Loss of "balance "and proximal motor weakness secondary to cervical stenosis-status post decompression surgery on 02/19/2012. Improved, but not resolved stenosis with mass effect on the spinal cord noted on a repeat MRI 03/28/2012. He underwent further decompression surgery on 04/06/2012. The ataxia, weakness, and peripheral numbness is much improved.  16. Right lower Extremity deep vein thrombosis 02/02/2012-a Doppler ultrasound confirmed a gastrocnemius and peroneal deep vein thrombosis. An IVC filter was placed prior to surgery and he was maintained on Lovenox. The IVC filter was removed on 05/04/2012.  17. Anorexia following cervical spine surgery. Improved.  18. Episode of flank pain and hematuria in 03/31/2012-etiology unclear. He completed a course of antibiotics. The hematuria and pain have resolved.  19. Rectal wall thickening noted on a CT of the pelvis 03/31/2012.  20. colonoscopy showed an area of abnormality at the rectum with biopsy positive for amyloid.  21. Lung nodule noted on the CT 03/31/2012 , no lung nodule on a chest CT 07/30/2013  22. Lumbar stenosis-status post surgery on 07/31/2012.  23. Status post stem cell collection.  24.Rright internal jugular thrombosis, 10/22/2012, likely related to pheresis catheter and HIT  25. Admission with acute right pulmonary embolism 10/27/2012  26. Superior mesenteric vein/portal vein thrombosis diagnosed on a CT of the abdomen 10/28/2012 and 10/29/2012-treated with Argatroban , maintained on Coumadin anticoagulation until he hospital admission March 2014 for stem cell therapy  27. HIT confirmed on a serotonin release assay 10/29/2012  28. History of Renal insufficiency during the December 2013 hospital admission-likely related to dehydration and? Contrast nephropathy ,  persistent mild elevation of the creatinine  29. Dysphagia-? Secondary to amyloidosis, reported when he was here on 05/08/2013. He did not complain of dysphagia today  30. Anemia secondary to multiple myeloma and chemotherapy , improved  31.right lower Extremity deep vein thrombosis diagnosed at [facility] July 2014 , repeat Doppler at [facility] 07/19/2013 with chronic thrombus in the right gastrocnemius, Eliquis has been discontinued  32. Fall August 2014 with a left shoulder injury, diagnosed with a rotator cuff tear, status post surgical repair 09/19/2013  33. 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 The neuropathy symptoms have partially improved since Velcade was discontinued after treatment 10/16/2013  34. Cardiac MRI 05/22/2015-normal left ventricular size with severe concentric hypertrophy and mildly impaired systolic function, TKWI-09%, right ventricular hypertrophy, diffuse late gadolinium enhancement in the left and right ventricles-findings consistent with cardiac amyloidosis. Echocardiogram at [facility]-LVEF 50%  Echocardiogram at [facility] 32,019- left ventricular hypertrophy, LVEF 55-60% 35. Multinodular goiter confirmed on thyroid ultrasound at [facility] 06/29/2016, amyloidosis of the thyroid confirmed at [facility] 04/20/2018   Disposition: Brent Allison appears improved compared to when I saw him last month.  The myeloma markers were stable and a chest x-ray revealed no acute change on 04/24/2019.  He is scheduled for follow-up at [facility] later this month.  He will see cardiology at [facility] in September.  He continues doxycycline.  Brent Allison will return for an office visit in 3 months.  I will see him in the interim as needed.  Betsy Coder, MD  06/25/2019  12:52  PM   

## 2019-06-25 NOTE — Telephone Encounter (Signed)
Scheduled appt per 8/03 los - printed AVS and calender for patient .

## 2019-06-26 DIAGNOSIS — L570 Actinic keratosis: Secondary | ICD-10-CM | POA: Diagnosis not present

## 2019-06-26 DIAGNOSIS — Z85828 Personal history of other malignant neoplasm of skin: Secondary | ICD-10-CM | POA: Diagnosis not present

## 2019-06-26 DIAGNOSIS — D1801 Hemangioma of skin and subcutaneous tissue: Secondary | ICD-10-CM | POA: Diagnosis not present

## 2019-06-26 DIAGNOSIS — D485 Neoplasm of uncertain behavior of skin: Secondary | ICD-10-CM | POA: Diagnosis not present

## 2019-06-26 DIAGNOSIS — L820 Inflamed seborrheic keratosis: Secondary | ICD-10-CM | POA: Diagnosis not present

## 2019-06-26 DIAGNOSIS — D692 Other nonthrombocytopenic purpura: Secondary | ICD-10-CM | POA: Diagnosis not present

## 2019-06-26 DIAGNOSIS — L821 Other seborrheic keratosis: Secondary | ICD-10-CM | POA: Diagnosis not present

## 2019-06-26 DIAGNOSIS — C4441 Basal cell carcinoma of skin of scalp and neck: Secondary | ICD-10-CM | POA: Diagnosis not present

## 2019-07-11 DIAGNOSIS — E854 Organ-limited amyloidosis: Secondary | ICD-10-CM | POA: Diagnosis not present

## 2019-07-11 DIAGNOSIS — I43 Cardiomyopathy in diseases classified elsewhere: Secondary | ICD-10-CM | POA: Diagnosis not present

## 2019-07-11 DIAGNOSIS — G4730 Sleep apnea, unspecified: Secondary | ICD-10-CM | POA: Diagnosis not present

## 2019-07-11 DIAGNOSIS — G629 Polyneuropathy, unspecified: Secondary | ICD-10-CM | POA: Diagnosis not present

## 2019-07-11 DIAGNOSIS — Z79899 Other long term (current) drug therapy: Secondary | ICD-10-CM | POA: Diagnosis not present

## 2019-07-11 DIAGNOSIS — E785 Hyperlipidemia, unspecified: Secondary | ICD-10-CM | POA: Diagnosis not present

## 2019-07-11 DIAGNOSIS — Z9221 Personal history of antineoplastic chemotherapy: Secondary | ICD-10-CM | POA: Diagnosis not present

## 2019-07-11 DIAGNOSIS — R5383 Other fatigue: Secondary | ICD-10-CM | POA: Diagnosis not present

## 2019-07-11 DIAGNOSIS — Z6827 Body mass index (BMI) 27.0-27.9, adult: Secondary | ICD-10-CM | POA: Diagnosis not present

## 2019-07-11 DIAGNOSIS — Z86718 Personal history of other venous thrombosis and embolism: Secondary | ICD-10-CM | POA: Diagnosis not present

## 2019-07-11 DIAGNOSIS — Z9484 Stem cells transplant status: Secondary | ICD-10-CM | POA: Diagnosis not present

## 2019-07-11 DIAGNOSIS — Z888 Allergy status to other drugs, medicaments and biological substances status: Secondary | ICD-10-CM | POA: Diagnosis not present

## 2019-07-11 DIAGNOSIS — Z86711 Personal history of pulmonary embolism: Secondary | ICD-10-CM | POA: Diagnosis not present

## 2019-07-11 DIAGNOSIS — R0609 Other forms of dyspnea: Secondary | ICD-10-CM | POA: Diagnosis not present

## 2019-07-11 DIAGNOSIS — I509 Heart failure, unspecified: Secondary | ICD-10-CM | POA: Diagnosis not present

## 2019-07-11 DIAGNOSIS — E8581 Light chain (AL) amyloidosis: Secondary | ICD-10-CM | POA: Diagnosis not present

## 2019-07-11 DIAGNOSIS — Z9481 Bone marrow transplant status: Secondary | ICD-10-CM | POA: Diagnosis not present

## 2019-07-23 ENCOUNTER — Telehealth: Payer: Self-pay

## 2019-07-23 DIAGNOSIS — Z9079 Acquired absence of other genital organ(s): Secondary | ICD-10-CM | POA: Diagnosis not present

## 2019-07-23 DIAGNOSIS — G4733 Obstructive sleep apnea (adult) (pediatric): Secondary | ICD-10-CM | POA: Diagnosis not present

## 2019-07-23 DIAGNOSIS — Z20828 Contact with and (suspected) exposure to other viral communicable diseases: Secondary | ICD-10-CM | POA: Diagnosis not present

## 2019-07-23 DIAGNOSIS — D7582 Heparin induced thrombocytopenia (HIT): Secondary | ICD-10-CM | POA: Diagnosis present

## 2019-07-23 DIAGNOSIS — R911 Solitary pulmonary nodule: Secondary | ICD-10-CM | POA: Diagnosis not present

## 2019-07-23 DIAGNOSIS — E854 Organ-limited amyloidosis: Secondary | ICD-10-CM | POA: Diagnosis not present

## 2019-07-23 DIAGNOSIS — C9000 Multiple myeloma not having achieved remission: Secondary | ICD-10-CM | POA: Diagnosis not present

## 2019-07-23 DIAGNOSIS — Z888 Allergy status to other drugs, medicaments and biological substances status: Secondary | ICD-10-CM | POA: Diagnosis not present

## 2019-07-23 DIAGNOSIS — I43 Cardiomyopathy in diseases classified elsewhere: Secondary | ICD-10-CM | POA: Diagnosis not present

## 2019-07-23 DIAGNOSIS — I509 Heart failure, unspecified: Secondary | ICD-10-CM | POA: Diagnosis present

## 2019-07-23 DIAGNOSIS — R042 Hemoptysis: Secondary | ICD-10-CM | POA: Diagnosis not present

## 2019-07-23 DIAGNOSIS — Z9221 Personal history of antineoplastic chemotherapy: Secondary | ICD-10-CM | POA: Diagnosis not present

## 2019-07-23 DIAGNOSIS — Z87891 Personal history of nicotine dependence: Secondary | ICD-10-CM | POA: Diagnosis not present

## 2019-07-23 DIAGNOSIS — E859 Amyloidosis, unspecified: Secondary | ICD-10-CM | POA: Diagnosis present

## 2019-07-23 DIAGNOSIS — Z8546 Personal history of malignant neoplasm of prostate: Secondary | ICD-10-CM | POA: Diagnosis not present

## 2019-07-23 DIAGNOSIS — Z9481 Bone marrow transplant status: Secondary | ICD-10-CM | POA: Diagnosis not present

## 2019-07-23 NOTE — Telephone Encounter (Signed)
TC from Pt stating he was in [employer] and woke up with congestion, cough and spit Bright Red blood. Returned Pt's call to which he confirmed his symptoms. Informed Pt per Dr. [name] he should see a doctor. Pt stated he would go to the emergency room in [facility] and if it is something serious he will come back to [facility] and call to get an appointment with Dr. [name].

## 2019-07-27 ENCOUNTER — Telehealth: Payer: Self-pay | Admitting: *Deleted

## 2019-07-27 NOTE — Telephone Encounter (Signed)
Patient called to report he was at [facility] from 07/23/19 to 07/26/19 for complaints of cough, congestion and hemoptysis. Had a bronchoscopy per Dr. Mertie Clause during hospitalization. Were not able to detect exact source of bleeding, but feel it is related to his amyloidosis. He is requesting a follow up with Dr. [name]. Faxed request for [facility] P, Discharge summary, xray reports and bronchoscopy report to [phone] as directed by HIM department automated line at [facility] [phone]).

## 2019-07-31 ENCOUNTER — Telehealth: Payer: Self-pay | Admitting: *Deleted

## 2019-07-31 NOTE — Telephone Encounter (Signed)
Called to inquire when Dr. [name] can see him regarding his hospitalization at [facility]? Still having small amount of hemoptysis about twice daily. Sees cardiologist on 9/11 at [facility] and ENT on 9/10.

## 2019-08-01 ENCOUNTER — Telehealth: Payer: Self-pay | Admitting: *Deleted

## 2019-08-01 NOTE — Telephone Encounter (Signed)
Called patient that Dr. [name] has reviewed the records from [facility]. Suggests he contact Dr. Amalia Hailey at [facility] when he is there on 9/11 seeing Dr. Rolene Course (cardiologist). They can arrange for pulmonary referral or Dr. [name] can order this for him. Should call for persistent hemoptysis. Informed him to obtain fax # for his cardiologist and the [facility] records can be faxed to them as well.

## 2019-08-03 DIAGNOSIS — D7582 Heparin induced thrombocytopenia (HIT): Secondary | ICD-10-CM | POA: Diagnosis not present

## 2019-08-03 DIAGNOSIS — Z6827 Body mass index (BMI) 27.0-27.9, adult: Secondary | ICD-10-CM | POA: Diagnosis not present

## 2019-08-03 DIAGNOSIS — I5032 Chronic diastolic (congestive) heart failure: Secondary | ICD-10-CM | POA: Diagnosis not present

## 2019-08-03 DIAGNOSIS — I742 Embolism and thrombosis of arteries of the upper extremities: Secondary | ICD-10-CM | POA: Diagnosis not present

## 2019-08-03 DIAGNOSIS — I82409 Acute embolism and thrombosis of unspecified deep veins of unspecified lower extremity: Secondary | ICD-10-CM | POA: Diagnosis not present

## 2019-08-03 DIAGNOSIS — I2699 Other pulmonary embolism without acute cor pulmonale: Secondary | ICD-10-CM | POA: Diagnosis not present

## 2019-08-03 DIAGNOSIS — E8581 Light chain (AL) amyloidosis: Secondary | ICD-10-CM | POA: Diagnosis not present

## 2019-08-06 ENCOUNTER — Telehealth: Payer: Self-pay | Admitting: *Deleted

## 2019-08-06 NOTE — Telephone Encounter (Signed)
Reports he has not had any hemoptysis in 5 days. However, he is requesting a local pulmonary referral. Can be seen anytime after 08/20/2019.

## 2019-08-07 DIAGNOSIS — H2513 Age-related nuclear cataract, bilateral: Secondary | ICD-10-CM | POA: Diagnosis not present

## 2019-08-10 ENCOUNTER — Other Ambulatory Visit: Payer: Self-pay | Admitting: *Deleted

## 2019-08-10 DIAGNOSIS — E8581 Light chain (AL) amyloidosis: Secondary | ICD-10-CM

## 2019-08-10 NOTE — Progress Notes (Signed)
Per Dr. [name]: Refer patient to Dr. Valeta Harms w/[facility] for amyloidosis and hemoptysis. Faxed over records from [facility] hospitalization to [facility] at [phone].

## 2019-08-28 DIAGNOSIS — E8581 Light chain (AL) amyloidosis: Secondary | ICD-10-CM | POA: Diagnosis not present

## 2019-08-28 DIAGNOSIS — I5032 Chronic diastolic (congestive) heart failure: Secondary | ICD-10-CM | POA: Diagnosis not present

## 2019-09-05 ENCOUNTER — Encounter: Payer: Self-pay | Admitting: Pulmonary Disease

## 2019-09-05 ENCOUNTER — Other Ambulatory Visit: Payer: Self-pay

## 2019-09-05 ENCOUNTER — Ambulatory Visit (INDEPENDENT_AMBULATORY_CARE_PROVIDER_SITE_OTHER): Payer: Medicare Other | Admitting: Pulmonary Disease

## 2019-09-05 VITALS — BP 110/72 | HR 60 | Ht 72.0 in | Wt 201.0 lb

## 2019-09-05 DIAGNOSIS — Z86711 Personal history of pulmonary embolism: Secondary | ICD-10-CM | POA: Diagnosis not present

## 2019-09-05 DIAGNOSIS — C9000 Multiple myeloma not having achieved remission: Secondary | ICD-10-CM | POA: Diagnosis not present

## 2019-09-05 DIAGNOSIS — R918 Other nonspecific abnormal finding of lung field: Secondary | ICD-10-CM

## 2019-09-05 DIAGNOSIS — R042 Hemoptysis: Secondary | ICD-10-CM | POA: Diagnosis not present

## 2019-09-05 DIAGNOSIS — R911 Solitary pulmonary nodule: Secondary | ICD-10-CM

## 2019-09-05 DIAGNOSIS — E8581 Light chain (AL) amyloidosis: Secondary | ICD-10-CM

## 2019-09-05 NOTE — Progress Notes (Signed)
Synopsis: Referred in October 2020 for amyloidosis by Ladell Pier, MD  Subjective:   PATIENT ID: Brent Allison GENDER: male DOB: Jul 31, 1946, MRN: [id number]  Chief Complaint  Patient presents with  . Pulmonary Consult    This is a 73 year old gentleman followed by Dr. [name].  Diagnosed in 2012 via eyelid biopsy, amyloidosis.  Found to have elevated serum free lambda light chain and lambda light chain proteinuria.  Bone marrow biopsy with plasmacytosis.  Plasma cell dyscrasia with associated amyloid was treated with Cytoxan, Velcade and Decadron in 2012.  2013 treated with Revlimid plus Decadron.  2014 high-dose melphalan followed by autologous stem cell infusion.  Again in 2014 receiving Velcade plus Decadron.  Continued this treatment for some time.  Patient was started in the [place] study at [facility] beginning in January 2017.  Then started in the ProFeno study which was discontinued in May 2018.  OV 09/05/2019: At [facility] hospital had hemoptysis. Was admitted to hospital in [facility] after having episode of hemoptysis.  He had CT imaging of the chest that was completed with no evidence of bleeding.  The patient underwent bronchoscopy which revealed bleeding from the right lower lobe opening and superior segment of the right lower lobe.  Images were taken of this and are stored in our media section.  Since his hospitalization he has not had any additional hemoptysis.  We went over the various reasons for potential hemoptysis today in the office.  He states that his hemoptysis started with episodes of coughing.  At the time had approximately half a tablespoon or so of blood.  That slowly dissipated over time.  No etiology was identified.  CAT scan imaging revealed a 11 mm subpleural nodule in the right upper lobe.  Recommending 80-monthfollow-up.  CT scan report will be put into our system.   Past Medical History:  Diagnosis Date  . [place] amyloidosis ([place]  11/04/2012  . Arthritis    lumbar DDD  . Blood dyscrasia    plasma cell dyscrasia with associated amyloidosis  . Complication of anesthesia   . DVT (deep venous thrombosis) ([place]    right  . Dyslipidemia   . H/O multiple myeloma   . Heparin induced thrombocytopenia (HIT) ([place] 11/04/2012  . Joint pain   . Peripheral vascular disease ([place]    DVT- 01/2012, follows by Dr. SBenay Spice lovenox maintained since Spring 2013 , pt. aware that last dose is sch. for 07/22/2012  . Pneumonia    hx of with last time in Nov 2012  . [place]state cancer ([place]    amyloidosis, multiple myeloma   . Sleep apnea    severe with AHI 44.4 events per hour - now on CPAP at 11cm H2O  . Spastic quadriparesis (HCC)      Family History  Problem Relation Age of Onset  . Pancreatic cancer Mother   . Anesthesia problems Neg Hx   . Hypotension Neg Hx   . Malignant hyperthermia Neg Hx   . Pseudochol deficiency Neg Hx      Past Surgical History:  Procedure Laterality Date  . ANTERIOR CERVICAL DECOMP/DISCECTOMY FUSION  02/26/2012   Procedure: ANTERIOR CERVICAL DECOMPRESSION/DISCECTOMY FUSION 2 LEVELS;  Surgeon: RHosie Spangle MD;  [facility];  Service: Neurosurgery;  Laterality: N/A;  C3-4 C4-5 Anterior cervical decompression/diskectomy, fusion  . BACK SURGERY    . CARDIAC CATHETERIZATION  2010  . CARPAL TUNNEL RELEASE  2009   left  . COLONOSCOPY    .  HERNIA REPAIR     around 1985  . ivt     IVC filter placed d/t blood clots, removed- July- 2013  . LIMBAL STEM CELL TRANSPLANT    . LIPOMA EXCISION  around 1985   removed from one of his shoulders  . POSTERIOR CERVICAL FUSION/FORAMINOTOMY  04/06/2012   Procedure: POSTERIOR CERVICAL FUSION/FORAMINOTOMY LEVEL 3;  Surgeon: Hosie Spangle, MD;  Location: [place] [facility]URO ORS;  Service: Neurosurgery;  Laterality: N/A;  C3 and C4 laminectomy with C23 C34 C45 posterior cervical arthrodesis with instrumentation  . POSTERIOR CERVICAL FUSION/FORAMINOTOMY N/A  06/02/2015   Procedure: Cervical Seven, Thoracic One, Thoracic Two Laminectomies with Cervical Seven-Thoracic One, Thoracic One-Thoracic Two Posterior Cervico-Thoracic  Arthrodesis with Instrumentation;  Surgeon: Jovita Gamma, MD;  Location: [place] [facility] ORS;  Service: Neurosurgery;  Laterality: N/A;  C7, T1, T2 laminectomies with C7-T2 cerevico thoracic arthrodesis with instrumentation  . POSTERIOR LAMINECTOMY / DECOMPRESSION LUMBAR SPINE  around 2007   L4-5  . PROSTATECTOMY  around 2005  . TONSILLECTOMY     as a child    Social History   Socioeconomic History  . Marital status: Married    Spouse name: Not on file  . Number of children: Not on file  . Years of education: Not on file  . Highest education level: Not on file  Occupational History  . Not on file  Social Needs  . Financial resource strain: Not on file  . Food insecurity    Worry: Not on file    Inability: Not on file  . Transportation needs    Medical: Not on file    Non-medical: Not on file  Tobacco Use  . Smoking status: Former Smoker    Packs/day: 0.50    Years: 10.00    Pack years: 5.00    Types: Cigarettes    Quit date: 07/21/1979    Years since quitting: 40.1  . Smokeless tobacco: Never Used  . Tobacco comment: quit 25+yrs ago  Substance and Sexual Activity  . Alcohol use: Yes    Alcohol/week: 2.0 standard drinks    Types: 2 Cans of beer per week    Comment: occasionally  . Drug use: No  . Sexual activity: Yes  Lifestyle  . Physical activity    Days per week: Not on file    Minutes per session: Not on file  . Stress: Not on file  Relationships  . Social Herbalist on phone: Not on file    Gets together: Not on file    Attends religious service: Not on file    Active member of club or organization: Not on file    Attends meetings of clubs or organizations: Not on file    Relationship status: Not on file  . Intimate partner violence    Fear of current or ex partner: Not on file     Emotionally abused: Not on file    Physically abused: Not on file    Forced sexual activity: Not on file  Other Topics Concern  . Not on file  Social History Narrative  . Not on file     Allergies  Allergen Reactions  . Enoxaparin Other (See Comments)    Blood clots HIT [SRA +]  . Heparin Other (See Comments)    Heparin induced thrombocytopenia (SRA +)     Outpatient Medications Prior to Visit  Medication Sig Dispense Refill  . doxycycline (DORYX) 100 MG EC tablet Take 100 mg by mouth 2 (  two) times daily.     . furosemide (LASIX) 20 MG tablet Take 40 mg by mouth 3 days. PRN    . spironolactone (ALDACTONE) 25 MG tablet Take 25 mg by mouth daily.    . diclofenac (VOLTAREN) 75 MG EC tablet Take 75 mg by mouth daily as needed.     No facility-administered medications prior to visit.     Review of Systems  Constitutional: Negative for chills, fever, malaise/fatigue and weight loss.  HENT: Negative for hearing loss, sore throat and tinnitus.   Eyes: Negative for blurred vision and double vision.  Respiratory: Negative for cough, hemoptysis, sputum production, shortness of breath, wheezing and stridor.   Cardiovascular: Negative for chest pain, palpitations, orthopnea, leg swelling and PND.  Gastrointestinal: Negative for abdominal pain, constipation, diarrhea, heartburn, nausea and vomiting.  Genitourinary: Negative for dysuria, hematuria and urgency.  Musculoskeletal: Negative for joint pain and myalgias.  Skin: Negative for itching and rash.  Neurological: Negative for dizziness, tingling, weakness and headaches.  Endo/Heme/Allergies: Negative for environmental allergies. Does not bruise/bleed easily.  Psychiatric/Behavioral: Negative for depression. The patient is not nervous/anxious and does not have insomnia.   All other systems reviewed and are negative.    Objective:  Physical Exam Vitals signs reviewed.  Constitutional:      General: He is not in acute distress.     Appearance: He is well-developed.  HENT:     Head: Normocephalic and atraumatic.  Eyes:     General: No scleral icterus.    Conjunctiva/sclera: Conjunctivae normal.     Pupils: Pupils are equal, round, and reactive to light.  Neck:     Musculoskeletal: Neck supple.     Vascular: No JVD.     Trachea: No tracheal deviation.  Cardiovascular:     Rate and Rhythm: Normal rate and regular rhythm.     Heart sounds: Normal heart sounds. No murmur.  Pulmonary:     Effort: Pulmonary effort is normal. No tachypnea, accessory muscle usage or respiratory distress.     Breath sounds: Normal breath sounds. No stridor. No wheezing, rhonchi or rales.  Abdominal:     General: Bowel sounds are normal. There is no distension.     Palpations: Abdomen is soft.     Tenderness: There is no abdominal tenderness.  Musculoskeletal:        General: No tenderness.  Lymphadenopathy:     Cervical: No cervical adenopathy.  Skin:    General: Skin is warm and dry.     Capillary Refill: Capillary refill takes less than 2 seconds.     Findings: No rash.  Neurological:     Mental Status: He is alert and oriented to person, place, and time.  Psychiatric:        Behavior: Behavior normal.      Vitals:   09/05/19 1058  BP: 110/72  Pulse: 60  SpO2: 98%  Weight: 201 lb (91.2 kg)  Height: 6' (1.829 m)   98% on RA BMI Readings from Last 3 Encounters:  09/05/19 27.26 kg/m  06/25/19 26.95 kg/m  04/24/19 27.11 kg/m   Wt Readings from Last 3 Encounters:  09/05/19 201 lb (91.2 kg)  06/25/19 198 lb 11.2 oz (90.1 kg)  04/24/19 199 lb 14.4 oz (90.7 kg)     CBC    Component Value Date/Time   WBC 6.0 04/24/2019 0906   WBC 5.9 08/05/2016 0917   WBC 10.6 (H) 06/02/2015 1938   RBC 4.16 (L) 04/24/2019 0906   HGB  13.3 04/24/2019 0906   HGB 13.1 08/05/2016 0917   HCT 41.0 04/24/2019 0906   HCT 39.1 08/05/2016 0917   PLT 167 04/24/2019 0906   PLT 155 08/05/2016 0917   MCV 98.6 04/24/2019 0906   MCV  93.3 08/05/2016 0917   MCH 32.0 04/24/2019 0906   MCHC 32.4 04/24/2019 0906   RDW 13.4 04/24/2019 0906   RDW 13.9 08/05/2016 0917   LYMPHSABS 1.2 04/24/2019 0906   LYMPHSABS 1.9 08/05/2016 0917   MONOABS 0.6 04/24/2019 0906   MONOABS 0.8 08/05/2016 0917   EOSABS 0.1 04/24/2019 0906   EOSABS 0.1 08/05/2016 0917   BASOSABS 0.0 04/24/2019 0906   BASOSABS 0.0 08/05/2016 0917     Chest Imaging: CT chest January 2018 completed at [facility] visible in care everywhere. Multiple stable bilateral calcified and noncalcified pulmonary nodules.  Largest at 0.8 cm.  Able to review the report however not able to review the images. The patient's images have been independently reviewed by me.    Pulmonary Functions Testing Results: No flowsheet data found.  FeNO: None   Pathology: None   Echocardiogram:   Heart Catheterization:  ECHO 08/28/2019 @ [facility] Summary  1. The left ventricle is normal in size with upper normal wall thickness.  2. Normal left ventricular systolic function, ejection fraction 55-60%.  3. Diastolic dysfunction - grade II (elevated filling pressures).  4. Normal right ventricular size and systolic function.  5. Overall stable to previous echo.    September 2020: [place]:     ICD-10-CM   1. Hemoptysis  R04.2   2. AL amyloidosis ([place])  E85.81   3. Multiple myeloma, remission status unspecified (HCC)  C90.00   4. History of pulmonary embolism  Z86.711   5. Abnormal findings on diagnostic imaging of lung  R91.8 CT Chest Wo Contrast  6. Nodule of upper lobe of right lung  R91.1     Discussion:  This is a 73 year old gentleman with complaints of hemoptysis.  He was also found to have a right upper lobe lung nodule on recent CAT scan imaging.  Please see documentation above regarding bronchoscopy and images that were taken during his visit in [facility].  No additional hemoptysis since then.  We reviewed today in the  office various etiologies of hemoptysis merit.  Most commonly hemoptysis is related to irritation or inflammation of the lower bronchi.  These consistent with bronchitis.  Patient otherwise has not had any additional symptoms since then.  Actually catching the etiology of hemoptysis is to ensure that there is no evidence of endobronchial lesion or no evidence of a lesion within the chest cavity.  He does appear by bronchoscopy it was coming from the right lower lobe.  CAT scan imaging is reassuring and bronchoscopy imaging that I can view here is reassuring that there is no evidence of endobronchial lesion.  Therefore I think we should proceed with close observation.  If the patient has recurrent hemoptysis we will need to further investigate potential underlying airway etiologies may need to consider repeat bronchoscopy at some point.  We will continue to observe.  Plan:   We will repeat noncontrast CT of the chest in 3 months for follow-up of the right upper lobe 11 mm lung nodule.  Patient has a prior remote history of occasional smoking.  But has never been a daily smoker. Lung nodules considered low risk.  Return to clinic in 3 months after CT imaging is complete.  Greater than 50% of this patient 60-minute office was spent face-to-face discussing the above recommendations and treatment plan.  Also additional time spent review of records from previous oncologic history and [facility] as well as [facility].    Current Outpatient Medications:  .  doxycycline (DORYX) 100 MG EC tablet, Take 100 mg by mouth 2 (two) times daily. , Disp: , Rfl:  .  furosemide (LASIX) 20 MG tablet, Take 40 mg by mouth 3 days. PRN, Disp: , Rfl:  .  spironolactone (ALDACTONE) 25 MG tablet, Take 25 mg by mouth daily., Disp: , Rfl:    Garner Nash, DO [facility] 09/05/2019 11:27 AM

## 2019-09-05 NOTE — Patient Instructions (Addendum)
Thank you for visiting Dr. Valeta Harms at [facility]. Today we recommend the following:  Orders Placed This Encounter  Procedures  . CT Chest Wo Contrast   Return in about 3 months (around 12/06/2019), or if symptoms worsen or fail to improve.    Please do your part to reduce the spread of COVID-19.

## 2019-09-18 ENCOUNTER — Other Ambulatory Visit: Payer: Self-pay | Admitting: Oncology

## 2019-09-18 ENCOUNTER — Telehealth: Payer: Self-pay | Admitting: Oncology

## 2019-09-18 NOTE — Telephone Encounter (Signed)
Changed date of appt per patient request

## 2019-09-25 ENCOUNTER — Ambulatory Visit: Payer: Medicare Other | Admitting: Oncology

## 2019-10-02 DIAGNOSIS — M47816 Spondylosis without myelopathy or radiculopathy, lumbar region: Secondary | ICD-10-CM | POA: Diagnosis not present

## 2019-10-02 DIAGNOSIS — M5136 Other intervertebral disc degeneration, lumbar region: Secondary | ICD-10-CM | POA: Diagnosis not present

## 2019-10-02 DIAGNOSIS — Z8579 Personal history of other malignant neoplasms of lymphoid, hematopoietic and related tissues: Secondary | ICD-10-CM | POA: Diagnosis not present

## 2019-10-02 DIAGNOSIS — Z981 Arthrodesis status: Secondary | ICD-10-CM | POA: Diagnosis not present

## 2019-10-02 DIAGNOSIS — M48062 Spinal stenosis, lumbar region with neurogenic claudication: Secondary | ICD-10-CM | POA: Diagnosis not present

## 2019-10-05 ENCOUNTER — Other Ambulatory Visit: Payer: Self-pay | Admitting: Neurosurgery

## 2019-10-05 DIAGNOSIS — Z981 Arthrodesis status: Secondary | ICD-10-CM

## 2019-10-16 ENCOUNTER — Other Ambulatory Visit: Payer: Self-pay

## 2019-10-16 ENCOUNTER — Ambulatory Visit
Admission: RE | Admit: 2019-10-16 | Discharge: 2019-10-16 | Disposition: A | Discharge status: Home / Self Care | Payer: Medicare Other | Source: Ambulatory Visit | Attending: Neurosurgery | Admitting: Neurosurgery

## 2019-10-16 DIAGNOSIS — M47816 Spondylosis without myelopathy or radiculopathy, lumbar region: Secondary | ICD-10-CM | POA: Diagnosis not present

## 2019-10-16 DIAGNOSIS — M48061 Spinal stenosis, lumbar region without neurogenic claudication: Secondary | ICD-10-CM | POA: Diagnosis not present

## 2019-10-16 DIAGNOSIS — Z981 Arthrodesis status: Secondary | ICD-10-CM

## 2019-10-31 DIAGNOSIS — M5127 Other intervertebral disc displacement, lumbosacral region: Secondary | ICD-10-CM | POA: Diagnosis not present

## 2019-10-31 DIAGNOSIS — M48062 Spinal stenosis, lumbar region with neurogenic claudication: Secondary | ICD-10-CM | POA: Diagnosis not present

## 2019-11-02 DIAGNOSIS — Z981 Arthrodesis status: Secondary | ICD-10-CM | POA: Diagnosis not present

## 2019-11-02 DIAGNOSIS — M47816 Spondylosis without myelopathy or radiculopathy, lumbar region: Secondary | ICD-10-CM | POA: Diagnosis not present

## 2019-11-02 DIAGNOSIS — M48062 Spinal stenosis, lumbar region with neurogenic claudication: Secondary | ICD-10-CM | POA: Diagnosis not present

## 2019-11-02 DIAGNOSIS — M545 Low back pain: Secondary | ICD-10-CM | POA: Diagnosis not present

## 2019-11-02 DIAGNOSIS — M5136 Other intervertebral disc degeneration, lumbar region: Secondary | ICD-10-CM | POA: Diagnosis not present

## 2019-11-02 DIAGNOSIS — G8929 Other chronic pain: Secondary | ICD-10-CM | POA: Diagnosis not present

## 2019-11-20 ENCOUNTER — Inpatient Hospital Stay: Payer: Medicare Other | Attending: Oncology | Admitting: Oncology

## 2019-11-20 ENCOUNTER — Other Ambulatory Visit: Payer: Self-pay

## 2019-11-20 ENCOUNTER — Telehealth: Payer: Self-pay | Admitting: Oncology

## 2019-11-20 VITALS — BP 124/65 | HR 61 | Temp 98.7°F | Resp 17 | Ht 73.0 in | Wt 204.1 lb

## 2019-11-20 DIAGNOSIS — M48061 Spinal stenosis, lumbar region without neurogenic claudication: Secondary | ICD-10-CM | POA: Diagnosis not present

## 2019-11-20 DIAGNOSIS — R5381 Other malaise: Secondary | ICD-10-CM | POA: Diagnosis not present

## 2019-11-20 DIAGNOSIS — M545 Low back pain: Secondary | ICD-10-CM | POA: Diagnosis not present

## 2019-11-20 DIAGNOSIS — M4802 Spinal stenosis, cervical region: Secondary | ICD-10-CM | POA: Diagnosis not present

## 2019-11-20 DIAGNOSIS — C9000 Multiple myeloma not having achieved remission: Secondary | ICD-10-CM | POA: Diagnosis not present

## 2019-11-20 DIAGNOSIS — Z8546 Personal history of malignant neoplasm of prostate: Secondary | ICD-10-CM | POA: Diagnosis not present

## 2019-11-20 DIAGNOSIS — E785 Hyperlipidemia, unspecified: Secondary | ICD-10-CM | POA: Insufficient documentation

## 2019-11-20 DIAGNOSIS — Z86718 Personal history of other venous thrombosis and embolism: Secondary | ICD-10-CM | POA: Insufficient documentation

## 2019-11-20 DIAGNOSIS — R911 Solitary pulmonary nodule: Secondary | ICD-10-CM | POA: Insufficient documentation

## 2019-11-20 DIAGNOSIS — E042 Nontoxic multinodular goiter: Secondary | ICD-10-CM | POA: Insufficient documentation

## 2019-11-20 DIAGNOSIS — Z7901 Long term (current) use of anticoagulants: Secondary | ICD-10-CM | POA: Diagnosis not present

## 2019-11-20 DIAGNOSIS — E8581 Light chain (AL) amyloidosis: Secondary | ICD-10-CM | POA: Diagnosis not present

## 2019-11-20 NOTE — Telephone Encounter (Signed)
Scheduled per 12/29 los, patient is notified.  

## 2019-11-20 NOTE — Progress Notes (Signed)
[place] OFFICE PROGRESS NOTE   Diagnosis: Amyloidosis  INTERVAL HISTORY:   Brent Allison returns as scheduled.  He has malaise.  No recurrent hemoptysis.  He continues to bruise easily.  He underwent a repeat echocardiogram at [facility] on 08/28/2019.  Left ventricle had upper normal wall thickness and the left ventricular ejection fraction was measured at 55-60%.  There is diastolic dysfunction.  Stable from the previous echocardiogram.  He is scheduled for a repeat chest CT and follow-up with Dr. Valeta Harms next month. He saw Dr. Sherwood Gambler for increased lower back pain. Objective:  Vital signs in last 24 hours:  Blood pressure 124/65, pulse 61, temperature 98.7 F (37.1 C), temperature source Temporal, resp. rate 17, height _0  (1.854 m), weight 204 lb 1.6 oz (92.6 kg), SpO2 100 %.    HEENT: Macroglossia, small ecchymoses at the eyelids Resp: Lungs clear bilaterally Cardio: Regular rate and rhythm GI: No hepatosplenomegaly Vascular: No leg edema, the right lower leg is larger than the left side   Portacath/PICC-without erythema  Lab Results:  Lab Results  Component Value Date   WBC 6.0 04/24/2019   HGB 13.3 04/24/2019   HCT 41.0 04/24/2019   MCV 98.6 04/24/2019   PLT 167 04/24/2019   NEUTROABS 4.0 04/24/2019    CMP  Lab Results  Component Value Date   NA 140 04/24/2019   K 4.7 04/24/2019   CL 107 04/24/2019   CO2 26 04/24/2019   GLUCOSE 102 (H) 04/24/2019   BUN 20 04/24/2019   CREATININE 1.32 (H) 04/24/2019   CALCIUM 8.8 (L) 04/24/2019   PROT 6.3 (L) 04/24/2019   ALBUMIN 3.3 (L) 04/24/2019   AST 16 04/24/2019   ALT 12 04/24/2019   ALKPHOS 92 04/24/2019   BILITOT 0.4 04/24/2019   GFRNONAA 53 (L) 04/24/2019   GFRAA >60 04/24/2019     Medications: I have reviewed the patient's current medications.   Assessment/Plan: 1. 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 2. "Bruising" at the eyelids and mouth: Likely related to amyloidosis,  persistent. 3. Numbness and loss of vibratory sense at the fingertips: This predated Velcade-based therapy, but worsened. Now much improved following cervical spine surgery. 4. Elevated serum free lambda light chains. The lambda light chains were lower on November 16 and slightly higher on 12/14/2011. 5. Lambda light chain proteinuria. 6. Bone marrow plasmacytosis: Variable increase in plasma cells noted on the bone marrow biopsy 07/29/2011 with plasma cells estimated to represent between 4% and 20% of the cellular population. 7. Remote history of prostate cancer. 8. Sleep apnea. 9. Dyslipidemia. 10. Report of pneumonia on 2 occasions in 2011. 11. Admission to a hospital in [place], [place] October 2012 with "pneumonia." A chest x-ray at [facility] on November 2 was negative .  12. Low serum immunoglobulin G level. 13. Plasma cell dyscrasia with associated amyloidosis.  1. Initiation of systemic therapy with Cytoxan, Velcade, and Decadron 08/12/2011. Cycle #2 was initiated on 09/16/2011. Cycle #3 was initiated on 10/15/2011. Velcade was placed on hold due to neuropathy. 2. The serum free lambda light chains were decreased on October 08 2011. 3. The serum free lambda light chains were slightly increased on 12/14/2011, lower on 12/29/2011 and 02/02/2012. 4. Initiation of Revlimid/Decadron February 2013, cycle 2 started on 01/28/2012. 5. High-dose melphalan chemotherapy followed by autologous stem cell infusion on 02/09/2013 6. Initiation of Velcade/Decadrontherapy 05/28/2013, he completed 2 cycles on a day 1, 4, 8, 11 schedule 7. Improvement in the serum free lambda light chains following  induction Velcade/Decadron  8. Initiation of weekly Velcade/Decadron on 07/16/2013, last treatment on 10/16/2013 9. Improvement in the serum free lambda light chains on 08/21/2013 and 09/18/2013 ([facility]) 10. Normal serum free lambda light chains on 12/13/2013 11. Mild increase of the serum free lambda  light chains beginning January 2016-stable 12. treatment on the Pronto study at [place] beginning 12/17/2015, in of study 11/23/2016  Started on phase 2b Prothenastudy-long-term safety/efficacy study 01/11/2017,discontinued May 2018  Doxycycline started 05/17/2017 15. Loss of "balance "and proximal motor weakness secondary to cervical stenosis-status post decompression surgery on 02/19/2012. Improved, but not resolved stenosis with mass effect on the spinal cord noted on a repeat MRI 03/28/2012. He underwent further decompression surgery on 04/06/2012. The ataxia, weakness, and peripheral numbness is much improved.  16. Right lower Extremity deep vein thrombosis 02/02/2012-a Doppler ultrasound confirmed a gastrocnemius and peroneal deep vein thrombosis. An IVC filter was placed prior to surgery and he was maintained on Lovenox. The IVC filter was removed on 05/04/2012.  17. Anorexia following cervical spine surgery. Improved.  18. Episode of flank pain and hematuria in 03/31/2012-etiology unclear. He completed a course of antibiotics. The hematuria and pain have resolved.  19. Rectal wall thickening noted on a CT of the pelvis 03/31/2012.  20. colonoscopy showed an area of abnormality at the rectum with biopsy positive for amyloid.  21. Lung nodule noted on the CT 03/31/2012 , no lung nodule on a chest CT 07/30/2013  22. Lumbar stenosis-status post surgery on 07/31/2012.  23. Status post stem cell collection.  24.Rright internal jugular thrombosis, 10/22/2012, likely related to pheresis catheter and HIT  25. Admission with acute right pulmonary embolism 10/27/2012  26. Superior mesenteric vein/portal vein thrombosis diagnosed on a CT of the abdomen 10/28/2012 and 10/29/2012-treated with Argatroban , maintained on Coumadin anticoagulation until he hospital admission March 2014 for stem cell therapy  27. HIT confirmed on a serotonin release assay 10/29/2012  28. History of Renal  insufficiency during the December 2013 hospital admission-likely related to dehydration and? Contrast nephropathy , persistent mild elevation of the creatinine  29. Dysphagia-? Secondary to amyloidosis, reported when he was here on 05/08/2013. He did not complain of dysphagia today  30. Anemia secondary to multiple myeloma and chemotherapy , improved  31.right lower Extremity deep vein thrombosis diagnosed at [facility] July 2014 , repeat Doppler at [facility] 07/19/2013 with chronic thrombus in the right gastrocnemius, Eliquis has been discontinued  32. Fall August 2014 with a left shoulder injury, diagnosed with a rotator cuff tear, status post surgical repair 09/19/2013  33. 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 The neuropathy symptoms have partially improved since Velcade was discontinued after treatment 10/16/2013  34. Cardiac MRI 05/22/2015-normal left ventricular size with severe concentric hypertrophy and mildly impaired systolic function, FTNB-39%, right ventricular hypertrophy, diffuse late gadolinium enhancement in the left and right ventricles-findings consistent with cardiac amyloidosis. Echocardiogram at [facility]-LVEF 50%  Echocardiogram at [facility] 04/20/2018 - left ventricular hypertrophy, LVEF 55-60%  Echocardiogram at [facility] 08/28/2019-left ventricular ejection fraction 55-60%, left ventricle normal in size with upper normal wall thickness 35. Multinodular goiter confirmed on thyroid ultrasound at [facility] 06/29/2016, amyloidosis of the thyroid confirmed at [facility] 04/20/2018     Disposition:  Brent Allison appears unchanged.  He is scheduled for a restaging evaluation at [facility] in March.  He is scheduled for repeat chest CT in January to follow-up on a lung nodule and history of hemoptysis.  He will return for an office visit here in 6 months.  Brent Allison  Brent Spice, MD  11/20/2019  1:53 PM

## 2019-12-04 ENCOUNTER — Ambulatory Visit
Admission: RE | Admit: 2019-12-04 | Discharge: 2019-12-04 | Disposition: A | Discharge status: Home / Self Care | Payer: Medicare Other | Source: Ambulatory Visit | Attending: Pulmonary Disease | Admitting: Pulmonary Disease

## 2019-12-04 DIAGNOSIS — R918 Other nonspecific abnormal finding of lung field: Secondary | ICD-10-CM

## 2019-12-05 ENCOUNTER — Other Ambulatory Visit: Payer: Self-pay

## 2019-12-05 ENCOUNTER — Ambulatory Visit (INDEPENDENT_AMBULATORY_CARE_PROVIDER_SITE_OTHER): Payer: Medicare Other | Admitting: Pulmonary Disease

## 2019-12-05 ENCOUNTER — Encounter: Payer: Self-pay | Admitting: Pulmonary Disease

## 2019-12-05 VITALS — BP 120/72 | HR 67 | Temp 97.2°F | Ht 72.0 in | Wt 206.0 lb

## 2019-12-05 DIAGNOSIS — Z86711 Personal history of pulmonary embolism: Secondary | ICD-10-CM

## 2019-12-05 DIAGNOSIS — R042 Hemoptysis: Secondary | ICD-10-CM | POA: Diagnosis not present

## 2019-12-05 DIAGNOSIS — C9000 Multiple myeloma not having achieved remission: Secondary | ICD-10-CM

## 2019-12-05 DIAGNOSIS — R918 Other nonspecific abnormal finding of lung field: Secondary | ICD-10-CM

## 2019-12-05 DIAGNOSIS — E8581 Light chain (AL) amyloidosis: Secondary | ICD-10-CM | POA: Diagnosis not present

## 2019-12-05 NOTE — Progress Notes (Signed)
Synopsis: Referred in October 2020 for amyloidosis by Hulan Fess, MD  Subjective:   PATIENT ID: Brent Allison GENDER: male DOB: [birth year], MRN: [id number]  Chief Complaint  Patient presents with  . Follow-up    Patient reports that he is doing well. He denies any sob or cough.     This is a 74 year old gentleman followed by Dr. [name].  Diagnosed in 2012 via eyelid biopsy, amyloidosis.  Found to have elevated serum free lambda light chain and lambda light chain proteinuria.  Bone marrow biopsy with plasmacytosis.  Plasma cell dyscrasia with associated amyloid was treated with Cytoxan, Velcade and Decadron in 2012.  2013 treated with Revlimid plus Decadron.  2014 high-dose melphalan followed by autologous stem cell infusion.  Again in 2014 receiving Velcade plus Decadron.  Continued this treatment for some time.  Patient was started in the [place] study at [facility] beginning in January 2017.  Then started in the ProFeno study which was discontinued in May 2018.  OV 09/05/2019: At [facility] hospital had hemoptysis. Was admitted to hospital in [facility] after having episode of hemoptysis.  He had CT imaging of the chest that was completed with no evidence of bleeding.  The patient underwent bronchoscopy which revealed bleeding from the right lower lobe opening and superior segment of the right lower lobe.  Images were taken of this and are stored in our media section.  Since his hospitalization he has not had any additional hemoptysis.  We went over the various reasons for potential hemoptysis today in the office.  He states that his hemoptysis started with episodes of coughing.  At the time had approximately half a tablespoon or so of blood.  That slowly dissipated over time.  No etiology was identified.  CAT scan imaging revealed a 11 mm subpleural nodule in the right upper lobe.  Recommending 76-monthfollow-up.  CT scan report will be put into our system.  OV 12/05/2019:  Patient here today after follow-up CT imaging.  CT imaging completed yesterday with no evidence of pulmonary nodules.  Prior CT imaging with 11 mm subpleural right upper lobe nodule recommend 381-monthollow-up.  This is now been completed.  Discussed results today with patient in the office. Patient has no respiratory symptoms today.  Overall doing well.  He does have questions regarding the Covid vaccine.  We discussed this today in office.  Patient denies any repeat hemoptysis   Past Medical History:  Diagnosis Date  . AL amyloidosis (HCAscension12/14/2013  . Arthritis    lumbar DDD  . Blood dyscrasia    plasma cell dyscrasia with associated amyloidosis  . Complication of anesthesia   . DVT (deep venous thrombosis) (HCB[place]   right  . Dyslipidemia   . H/O multiple myeloma   . Heparin induced thrombocytopenia (HIT) (HCMonterey12/14/2013  . Joint pain   . Peripheral vascular disease (HCPolo   DVT- 01/2012, follows by Dr. ShBenay Spicelovenox maintained since Spring 2013 , pt. aware that last dose is sch. for 07/22/2012  . Pneumonia    hx of with last time in Nov 2012  . Prostate cancer (HCWest [place]   amyloidosis, multiple myeloma   . Sleep apnea    severe with AHI 44.4 events per hour - now on CPAP at 11cm H2O  . Spastic quadriparesis (HCC)      Family History  Problem Relation Age of Onset  . Pancreatic cancer Mother   . Anesthesia problems Neg Hx   .  Hypotension Neg Hx   . Malignant hyperthermia Neg Hx   . Pseudochol deficiency Neg Hx      Past Surgical History:  Procedure Laterality Date  . ANTERIOR CERVICAL DECOMP/DISCECTOMY FUSION  02/26/2012   Procedure: ANTERIOR CERVICAL DECOMPRESSION/DISCECTOMY FUSION 2 LEVELS;  Surgeon: Hosie Spangle, MD;  Location: [place] [facility];  Service: Neurosurgery;  Laterality: N/A;  C3-4 C4-5 Anterior cervical decompression/diskectomy, fusion  . BACK SURGERY    . CARDIAC CATHETERIZATION  2010  . CARPAL TUNNEL RELEASE  2009   left  . COLONOSCOPY    . HERNIA  REPAIR     around 1985  . ivt     IVC filter placed d/t blood clots, removed- July- 2013  . LIMBAL STEM CELL TRANSPLANT    . LIPOMA EXCISION  around 1985   removed from one of his shoulders  . POSTERIOR CERVICAL FUSION/FORAMINOTOMY  04/06/2012   Procedure: POSTERIOR CERVICAL FUSION/FORAMINOTOMY LEVEL 3;  Surgeon: Hosie Spangle, MD;  Location: [place] [facility];  Service: Neurosurgery;  Laterality: N/A;  C3 and C4 laminectomy with C23 C34 C45 posterior cervical arthrodesis with instrumentation  . POSTERIOR CERVICAL FUSION/FORAMINOTOMY N/A 06/02/2015   Procedure: Cervical Seven, Thoracic One, Thoracic Two Laminectomies with Cervical Seven-Thoracic One, Thoracic One-Thoracic Two Posterior Cervico-Thoracic  Arthrodesis with Instrumentation;  Surgeon: Jovita Gamma, MD;  Location: [place] [facility];  Service: Neurosurgery;  Laterality: N/A;  C7, T1, T2 laminectomies with C7-T2 cerevico thoracic arthrodesis with instrumentation  . POSTERIOR LAMINECTOMY / DECOMPRESSION LUMBAR SPINE  around 2007   L4-5  . PROSTATECTOMY  around 2005  . TONSILLECTOMY     as a child    Social History   Socioeconomic History  . Marital status: Married    Spouse name: Not on file  . Number of children: Not on file  . Years of education: Not on file  . Highest education level: Not on file  Occupational History  . Not on file  Tobacco Use  . Smoking status: Former Smoker    Packs/day: 0.50    Years: 10.00    Pack years: 5.00    Types: Cigarettes    Quit date: 07/21/1979    Years since quitting: 40.4  . Smokeless tobacco: Never Used  . Tobacco comment: quit 25+yrs ago  Substance and Sexual Activity  . Alcohol use: Yes    Alcohol/week: 2.0 standard drinks    Types: 2 Cans of beer per week    Comment: occasionally  . Drug use: No  . Sexual activity: Yes  Other Topics Concern  . Not on file  Social History Narrative  . Not on file   Social Determinants of Health   Financial Resource Strain:   . Difficulty  of Paying Living Expenses: Not on file  Food Insecurity:   . Worried About Charity fundraiser in the Last Year: Not on file  . Ran Out of Food in the Last Year: Not on file  Transportation Needs:   . Lack of Transportation (Medical): Not on file  . Lack of Transportation (Non-Medical): Not on file  Physical Activity:   . Days of Exercise per Week: Not on file  . Minutes of Exercise per Session: Not on file  Stress:   . Feeling of Stress : Not on file  Social Connections:   . Frequency of Communication with Friends and Family: Not on file  . Frequency of Social Gatherings with Friends and Family: Not on file  . Attends Religious Services: Not on  file  . Active Member of Clubs or Organizations: Not on file  . Attends Archivist Meetings: Not on file  . Marital Status: Not on file  Intimate Partner Violence:   . Fear of Current or Ex-Partner: Not on file  . Emotionally Abused: Not on file  . Physically Abused: Not on file  . Sexually Abused: Not on file     Allergies  Allergen Reactions  . Enoxaparin Other (See Comments)    Blood clots HIT [SRA +]  . Heparin Other (See Comments)    Heparin induced thrombocytopenia (SRA +)     Outpatient Medications Prior to Visit  Medication Sig Dispense Refill  . diphenhydrAMINE (SOMINEX) 25 MG tablet Take 25 mg by mouth as needed.    . doxycycline (DORYX) 100 MG EC tablet Take 100 mg by mouth 2 (two) times daily.     . furosemide (LASIX) 20 MG tablet Take 40 mg by mouth 3 days. PRN    . spironolactone (ALDACTONE) 25 MG tablet Take 25 mg by mouth daily.     No facility-administered medications prior to visit.    Review of Systems  Constitutional: Negative for chills, fever, malaise/fatigue and weight loss.  HENT: Negative for hearing loss, sore throat and tinnitus.   Eyes: Negative for blurred vision and double vision.  Respiratory: Negative for cough, hemoptysis, sputum production, shortness of breath, wheezing and stridor.    Cardiovascular: Negative for chest pain, palpitations, orthopnea, leg swelling and PND.  Gastrointestinal: Negative for abdominal pain, constipation, diarrhea, heartburn, nausea and vomiting.  Genitourinary: Negative for dysuria, hematuria and urgency.  Musculoskeletal: Negative for joint pain and myalgias.  Skin: Negative for itching and rash.  Neurological: Negative for dizziness, tingling, weakness and headaches.  Endo/Heme/Allergies: Negative for environmental allergies. Does not bruise/bleed easily.  Psychiatric/Behavioral: Negative for depression. The patient is not nervous/anxious and does not have insomnia.   All other systems reviewed and are negative.    Objective:  Physical Exam Vitals reviewed.  Constitutional:      General: He is not in acute distress.    Appearance: He is well-developed.  HENT:     Head: Normocephalic and atraumatic.  Eyes:     General: No scleral icterus.    Conjunctiva/sclera: Conjunctivae normal.     Pupils: Pupils are equal, round, and reactive to light.  Neck:     Vascular: No JVD.     Trachea: No tracheal deviation.  Cardiovascular:     Rate and Rhythm: Normal rate and regular rhythm.     Heart sounds: Normal heart sounds. No murmur.  Pulmonary:     Effort: Pulmonary effort is normal. No tachypnea, accessory muscle usage or respiratory distress.     Breath sounds: Normal breath sounds. No stridor. No wheezing, rhonchi or rales.  Abdominal:     General: Bowel sounds are normal. There is no distension.     Palpations: Abdomen is soft.     Tenderness: There is no abdominal tenderness.     Comments: Obese  Musculoskeletal:        General: No tenderness.     Cervical back: Neck supple.  Lymphadenopathy:     Cervical: No cervical adenopathy.  Skin:    General: Skin is warm and dry.     Capillary Refill: Capillary refill takes less than 2 seconds.     Findings: No rash.  Neurological:     Mental Status: He is alert and oriented to  person, place, and time.  Psychiatric:  Behavior: Behavior normal.      Vitals:   12/05/19 1006  BP: 120/72  Pulse: 67  SpO2: 98%   98% on RA BMI Readings from Last 3 Encounters:  11/20/19 26.93 kg/m  09/05/19 27.26 kg/m  06/25/19 26.95 kg/m   Wt Readings from Last 3 Encounters:  11/20/19 204 lb 1.6 oz (92.6 kg)  09/05/19 201 lb (91.2 kg)  06/25/19 198 lb 11.2 oz (90.1 kg)     CBC    Component Value Date/Time   WBC 6.0 04/24/2019 0906   WBC 5.9 08/05/2016 0917   WBC 10.6 (H) 06/02/2015 1938   RBC 4.16 (L) 04/24/2019 0906   HGB 13.3 04/24/2019 0906   HGB 13.1 08/05/2016 0917   HCT 41.0 04/24/2019 0906   HCT 39.1 08/05/2016 0917   PLT 167 04/24/2019 0906   PLT 155 08/05/2016 0917   MCV 98.6 04/24/2019 0906   MCV 93.3 08/05/2016 0917   MCH 32.0 04/24/2019 0906   MCHC 32.4 04/24/2019 0906   RDW 13.4 04/24/2019 0906   RDW 13.9 08/05/2016 0917   LYMPHSABS 1.2 04/24/2019 0906   LYMPHSABS 1.9 08/05/2016 0917   MONOABS 0.6 04/24/2019 0906   MONOABS 0.8 08/05/2016 0917   EOSABS 0.1 04/24/2019 0906   EOSABS 0.1 08/05/2016 0917   BASOSABS 0.0 04/24/2019 0906   BASOSABS 0.0 08/05/2016 0917     Chest Imaging: CT chest January 2018 completed at [facility] visible in care everywhere. Multiple stable bilateral calcified and noncalcified pulmonary nodules.  Largest at 0.8 cm.  Able to review the report however not able to review the images. The patient's images have been independently reviewed by me.    CT chest January 2021: Images reviewed no evidence of lung nodule, resolution.  Basilar atelectasis, no acute process. The patient's images have been independently reviewed by me.    Pulmonary Functions Testing Results: No flowsheet data found.  FeNO: None   Pathology: None   Echocardiogram:   Heart Catheterization:  ECHO 08/28/2019 @ [facility] Summary  1. The left ventricle is normal in size with upper normal wall thickness.  2. Normal left ventricular  systolic function, ejection fraction 55-60%.  3. Diastolic dysfunction - grade II (elevated filling pressures).  4. Normal right ventricular size and systolic function.  5. Overall stable to previous echo.    September 2020: [facility]:     ICD-10-CM   1. AL amyloidosis ([place])  E85.81   2. Multiple myeloma, remission status unspecified (HCC)  C90.00   3. History of pulmonary embolism  Z86.711   4. Hemoptysis  R04.2   5. Abnormal findings on diagnostic imaging of lung  R91.8     Discussion:  74 year old gentleman initially seen for evaluation of hemoptysis that happened spontaneously while he was on vacation in [place] was seen at [facility] in [place].  He has a history of amyloidosis.  He is followed by Dr. [name] as well as [facility]  CAT scan at the time revealed a small lung nodule subpleural.  Here today for follow-up after CT scan completed.  CT revealed resolution of this nodule no evidence of pulmonary neoplasm or abnormality.  Today in the office we reviewed the CT in detail.  Patient will have so had questions regarding Covid vaccination.  We discussed this today in the office and encouraged him to obtain the vaccine once able.  Patient will be 74 in April but hopefully will be eligible  before then with vaccination schedules.  Plan: Patient to return to clinic in 1 year or as needed based on symptoms.  If patient has repeat evidence of hemoptysis he should reach out to [place] for further evaluation.  Greater than 50% of this patient's 21-minute of visit was spent face-to-face discussing the above recommendations and treatment plan.   Current Outpatient Medications:  .  diphenhydrAMINE (SOMINEX) 25 MG tablet, Take 25 mg by mouth as needed., Disp: , Rfl:  .  doxycycline (DORYX) 100 MG EC tablet, Take 100 mg by mouth 2 (two) times daily. , Disp: , Rfl:  .  furosemide (LASIX) 20 MG tablet, Take 40 mg by mouth  3 days. PRN, Disp: , Rfl:  .  spironolactone (ALDACTONE) 25 MG tablet, Take 25 mg by mouth daily., Disp: , Rfl:    Garner Nash, DO [facility] 12/05/2019 10:07 AM

## 2019-12-05 NOTE — Patient Instructions (Addendum)
Thank you for visiting Dr. Valeta Harms at [facility]. Today we recommend the following:  Please call with any changes in respiratory symptoms or repeat episodes of hemoptysis (coughing up blood).   Return in about 1 year (around 12/04/2020).     Please do your part to reduce the spread of COVID-19.

## 2019-12-12 ENCOUNTER — Encounter: Payer: Self-pay | Admitting: Oncology

## 2019-12-27 DIAGNOSIS — L570 Actinic keratosis: Secondary | ICD-10-CM | POA: Diagnosis not present

## 2019-12-27 DIAGNOSIS — D225 Melanocytic nevi of trunk: Secondary | ICD-10-CM | POA: Diagnosis not present

## 2019-12-27 DIAGNOSIS — D485 Neoplasm of uncertain behavior of skin: Secondary | ICD-10-CM | POA: Diagnosis not present

## 2019-12-27 DIAGNOSIS — L820 Inflamed seborrheic keratosis: Secondary | ICD-10-CM | POA: Diagnosis not present

## 2019-12-27 DIAGNOSIS — C4442 Squamous cell carcinoma of skin of scalp and neck: Secondary | ICD-10-CM | POA: Diagnosis not present

## 2019-12-27 DIAGNOSIS — D1801 Hemangioma of skin and subcutaneous tissue: Secondary | ICD-10-CM | POA: Diagnosis not present

## 2019-12-27 DIAGNOSIS — Z85828 Personal history of other malignant neoplasm of skin: Secondary | ICD-10-CM | POA: Diagnosis not present

## 2019-12-27 DIAGNOSIS — L821 Other seborrheic keratosis: Secondary | ICD-10-CM | POA: Diagnosis not present

## 2020-01-15 DIAGNOSIS — M545 Low back pain: Secondary | ICD-10-CM | POA: Diagnosis not present

## 2020-01-15 DIAGNOSIS — M5136 Other intervertebral disc degeneration, lumbar region: Secondary | ICD-10-CM | POA: Diagnosis not present

## 2020-01-15 DIAGNOSIS — Z981 Arthrodesis status: Secondary | ICD-10-CM | POA: Diagnosis not present

## 2020-01-15 DIAGNOSIS — Z8579 Personal history of other malignant neoplasms of lymphoid, hematopoietic and related tissues: Secondary | ICD-10-CM | POA: Diagnosis not present

## 2020-01-15 DIAGNOSIS — M47816 Spondylosis without myelopathy or radiculopathy, lumbar region: Secondary | ICD-10-CM | POA: Diagnosis not present

## 2020-01-29 DIAGNOSIS — Z6828 Body mass index (BMI) 28.0-28.9, adult: Secondary | ICD-10-CM | POA: Diagnosis not present

## 2020-01-29 DIAGNOSIS — I5032 Chronic diastolic (congestive) heart failure: Secondary | ICD-10-CM | POA: Diagnosis not present

## 2020-01-29 DIAGNOSIS — Z9484 Stem cells transplant status: Secondary | ICD-10-CM | POA: Diagnosis not present

## 2020-01-29 DIAGNOSIS — G629 Polyneuropathy, unspecified: Secondary | ICD-10-CM | POA: Diagnosis not present

## 2020-01-29 DIAGNOSIS — I2699 Other pulmonary embolism without acute cor pulmonale: Secondary | ICD-10-CM | POA: Diagnosis not present

## 2020-01-29 DIAGNOSIS — E854 Organ-limited amyloidosis: Secondary | ICD-10-CM | POA: Diagnosis not present

## 2020-01-29 DIAGNOSIS — Z9481 Bone marrow transplant status: Secondary | ICD-10-CM | POA: Diagnosis not present

## 2020-01-29 DIAGNOSIS — R0600 Dyspnea, unspecified: Secondary | ICD-10-CM | POA: Diagnosis not present

## 2020-01-29 DIAGNOSIS — Z79899 Other long term (current) drug therapy: Secondary | ICD-10-CM | POA: Diagnosis not present

## 2020-01-29 DIAGNOSIS — Z9221 Personal history of antineoplastic chemotherapy: Secondary | ICD-10-CM | POA: Diagnosis not present

## 2020-01-29 DIAGNOSIS — I742 Embolism and thrombosis of arteries of the upper extremities: Secondary | ICD-10-CM | POA: Diagnosis not present

## 2020-01-29 DIAGNOSIS — Z888 Allergy status to other drugs, medicaments and biological substances status: Secondary | ICD-10-CM | POA: Diagnosis not present

## 2020-01-29 DIAGNOSIS — E8581 Light chain (AL) amyloidosis: Secondary | ICD-10-CM | POA: Diagnosis not present

## 2020-01-29 DIAGNOSIS — I43 Cardiomyopathy in diseases classified elsewhere: Secondary | ICD-10-CM | POA: Diagnosis not present

## 2020-01-29 DIAGNOSIS — I5030 Unspecified diastolic (congestive) heart failure: Secondary | ICD-10-CM | POA: Diagnosis not present

## 2020-01-29 DIAGNOSIS — R0609 Other forms of dyspnea: Secondary | ICD-10-CM | POA: Diagnosis not present

## 2020-02-15 DIAGNOSIS — Z9221 Personal history of antineoplastic chemotherapy: Secondary | ICD-10-CM | POA: Diagnosis not present

## 2020-02-15 DIAGNOSIS — E8581 Light chain (AL) amyloidosis: Secondary | ICD-10-CM | POA: Diagnosis not present

## 2020-02-15 DIAGNOSIS — Z6827 Body mass index (BMI) 27.0-27.9, adult: Secondary | ICD-10-CM | POA: Diagnosis not present

## 2020-02-15 DIAGNOSIS — I742 Embolism and thrombosis of arteries of the upper extremities: Secondary | ICD-10-CM | POA: Diagnosis not present

## 2020-02-15 DIAGNOSIS — Z87891 Personal history of nicotine dependence: Secondary | ICD-10-CM | POA: Diagnosis not present

## 2020-02-15 DIAGNOSIS — G629 Polyneuropathy, unspecified: Secondary | ICD-10-CM | POA: Diagnosis not present

## 2020-02-15 DIAGNOSIS — Z9481 Bone marrow transplant status: Secondary | ICD-10-CM | POA: Diagnosis not present

## 2020-02-15 DIAGNOSIS — E785 Hyperlipidemia, unspecified: Secondary | ICD-10-CM | POA: Diagnosis not present

## 2020-02-15 DIAGNOSIS — D7582 Heparin induced thrombocytopenia (HIT): Secondary | ICD-10-CM | POA: Diagnosis not present

## 2020-02-15 DIAGNOSIS — I5032 Chronic diastolic (congestive) heart failure: Secondary | ICD-10-CM | POA: Diagnosis not present

## 2020-03-17 ENCOUNTER — Other Ambulatory Visit (INDEPENDENT_AMBULATORY_CARE_PROVIDER_SITE_OTHER): Payer: Medicare Other

## 2020-03-17 ENCOUNTER — Encounter: Payer: Self-pay | Admitting: Pulmonary Disease

## 2020-03-17 ENCOUNTER — Ambulatory Visit (INDEPENDENT_AMBULATORY_CARE_PROVIDER_SITE_OTHER): Payer: Medicare Other

## 2020-03-17 ENCOUNTER — Ambulatory Visit: Payer: Medicare Other

## 2020-03-17 ENCOUNTER — Ambulatory Visit (INDEPENDENT_AMBULATORY_CARE_PROVIDER_SITE_OTHER): Payer: Medicare Other | Admitting: Pulmonary Disease

## 2020-03-17 ENCOUNTER — Other Ambulatory Visit: Payer: Self-pay

## 2020-03-17 VITALS — BP 110/72 | HR 72 | Temp 98.6°F | Ht 72.0 in | Wt 203.1 lb

## 2020-03-17 DIAGNOSIS — R042 Hemoptysis: Secondary | ICD-10-CM

## 2020-03-17 DIAGNOSIS — C9001 Multiple myeloma in remission: Secondary | ICD-10-CM

## 2020-03-17 DIAGNOSIS — I2699 Other pulmonary embolism without acute cor pulmonale: Secondary | ICD-10-CM

## 2020-03-17 DIAGNOSIS — E8581 Light chain (AL) amyloidosis: Secondary | ICD-10-CM | POA: Diagnosis not present

## 2020-03-17 LAB — CBC WITH DIFFERENTIAL/PLATELET
Basophils Absolute: 0.1 10*3/uL (ref 0.0–0.1)
Basophils Relative: 1.2 % (ref 0.0–3.0)
Eosinophils Absolute: 0.1 10*3/uL (ref 0.0–0.7)
Eosinophils Relative: 1.4 % (ref 0.0–5.0)
HCT: 41.5 % (ref 39.0–52.0)
Hemoglobin: 14.1 g/dL (ref 13.0–17.0)
Lymphocytes Relative: 26.1 % (ref 12.0–46.0)
Lymphs Abs: 2.4 10*3/uL (ref 0.7–4.0)
MCHC: 33.9 g/dL (ref 30.0–36.0)
MCV: 95.5 fl (ref 78.0–100.0)
Monocytes Absolute: 0.9 10*3/uL (ref 0.1–1.0)
Monocytes Relative: 10.1 % (ref 3.0–12.0)
Neutro Abs: 5.6 10*3/uL (ref 1.4–7.7)
Neutrophils Relative %: 61.2 % (ref 43.0–77.0)
Platelets: 189 10*3/uL (ref 150.0–400.0)
RBC: 4.34 Mil/uL (ref 4.22–5.81)
RDW: 14.1 % (ref 11.5–15.5)
WBC: 9.2 10*3/uL (ref 4.0–10.5)

## 2020-03-17 MED ORDER — BENZONATATE 200 MG PO CAPS: 200.0000 mg | ORAL_CAPSULE | Freq: Three times a day (TID) | ORAL | 2 refills | Status: DC | PRN

## 2020-03-17 NOTE — Assessment & Plan Note (Signed)
Plan:  Keep follow up with Dr. [name] and [facility] health

## 2020-03-17 NOTE — Assessment & Plan Note (Addendum)
Plan:  Keep follow up with Dr. [name] and [facility] health

## 2020-03-17 NOTE — Patient Instructions (Addendum)
You were seen today by Lauraine Rinne, NP  for:   1. Hemoptysis  - DG Chest 2 View; Future - benzonatate (TESSALON) 200 MG capsule; Take 1 capsule (200 mg total) by mouth 3 (three) times daily as needed for cough.  Dispense: 90 capsule; Refill: 2 - CBC with Differential/Platelet; Future  Start using Tessalon Perles every 8 hours as needed for cough suppression  Can also use Delsym over-the-counter cough syrup  If you continue to have worsening cough especially at night please let us know and I can call in a narcotic cough syrup to help with cough suppression  I will discuss your case with Dr. Valeta Harms   Labs can be done at: [address]    2. AL amyloidosis ([place])  - DG Chest 2 View; Future  Keep follow-up with Dr. [name] / [facility]   We recommend today:  Orders Placed This Encounter  Procedures  . DG Chest 2 View    Standing Status:   Future    Number of Occurrences:   1    Standing Expiration Date:   05/17/2021    Order Specific Question:   Reason for Exam (SYMPTOM  OR DIAGNOSIS REQUIRED)    Answer:   hemoptypsis    Order Specific Question:   Preferred imaging location?    Answer:   Internal    Order Specific Question:   Radiology Contrast Protocol - do NOT remove file path    Answer:   [email or web address]  . CBC with Differential/Platelet    Standing Status:   Future    Standing Expiration Date:   03/17/2021   Orders Placed This Encounter  Procedures  . DG Chest 2 View  . CBC with Differential/Platelet   Meds ordered this encounter  Medications  . benzonatate (TESSALON) 200 MG capsule    Sig: Take 1 capsule (200 mg total) by mouth 3 (three) times daily as needed for cough.    Dispense:  90 capsule    Refill:  2    Follow Up:    Return in about 1 week (around 03/24/2020), or if symptoms worsen or fail to improve, for Follow up with Wyn Quaker FNP-C, Follow up with Dr. Valeta Harms.  Ok to be telephonic     Please do your  part to reduce the spread of COVID-19:      Reduce your risk of any infection  and COVID19 by using the similar precautions used for avoiding the common cold or flu:  Marland Kitchen Wash your hands often with soap and warm water for at least 20 seconds.  If soap and water are not readily available, use an alcohol-based hand sanitizer with at least 60% alcohol.  . If coughing or sneezing, cover your mouth and nose by coughing or sneezing into the elbow areas of your shirt or coat, into a tissue or into your sleeve (not your hands). Langley Gauss A MASK when in public  . Avoid shaking hands with others and consider head nods or verbal greetings only. . Avoid touching your eyes, nose, or mouth with unwashed hands.  . Avoid close contact with people who are sick. . Avoid places or events with large numbers of people in one location, like concerts or sporting events. . If you have some symptoms but not all symptoms, continue to monitor at home and seek medical attention if your symptoms worsen. . If you are having a medical emergency, call 911.   [place]  Telehealth / e-Visit: [email or web address]         [facility]: [facility]: [id number]                   [facility]: [id number]     It is flu season:   >>> Best ways to protect herself from the flu: Receive the yearly flu vaccine, practice good hand hygiene washing with soap and also using hand sanitizer when available, eat a nutritious meals, get adequate rest, hydrate appropriately   Please contact the office if your symptoms worsen or you have concerns that you are not improving.   Thank you for choosing [facility] for your healthcare, and for allowing [place] to partner with you on your healthcare journey. I am thankful to be able to provide care to you today.   Wyn Quaker FNP-C

## 2020-03-17 NOTE — Assessment & Plan Note (Signed)
Hx of  Wells score today 2.5 Vital signs stable, no oxygen desaturations on walk today Chest x-ray clear No leg pain, unilateral leg swelling or chest pain or heart racing  Plan: We will continue to clinically monitor

## 2020-03-17 NOTE — Progress Notes (Signed)
Patient identification verified. Results of stable CBC reviewed. Patient verbalized understanding of results.

## 2020-03-17 NOTE — Assessment & Plan Note (Signed)
History of hemoptysis Known amyloidosis Not on any blood thinners Wells score 2.5 Chest x-ray clear today Vital signs stable today  Discussion: Likely bronchial irritation.  We will continue to monitor.  Will encourage cough suppression today.  We will keep close follow-up with patient with a 1 week follow-up to monitor hemoptysis.  Counseled patient that if symptoms should worsen such as chest pain, increased shortness of breath, increased amount of this to contact her office sooner or seek emergent evaluation.  I will also discuss the case with Dr. Valeta Harms.  Plan: Tessalon Perles today Can use Delsym over-the-counter Chest x-ray today clear Walk today in office clear Lab work today If symptoms worsen seek emergent evaluation 1 week follow-up with our office

## 2020-03-17 NOTE — Progress Notes (Signed)
@Patient  ID: Brent Allison, male    DOB: [birth year], 74 y.o.   MRN: [id number]  Chief Complaint  Patient presents with  . Follow-up    Per patient, has been coughing up blood for the past 2 days. Blood appeared to be bright red yesterday, dark red today. Denied any chest pain. No SOB.     Referring provider: Hulan Fess, MD  HPI:  74 year old male former smoker initially consulted with our practice in October/2020 for amyloidosis  PMH: Dyslipidemia, multiple myeloma, history of DVT, history of PE Smoker/ Smoking History: Former Smoker Maintenance: None Pt of: Dr. Valeta Harms  03/17/2020  - Visit   March/2021 [facility] Cards Follow Up  ASSESSMENT   - Cardiac light chain (AL) amyloidosis, NYHA class 2 and stable - HFpEF secondary to above - s/p high-dose chemotherapy with autologous stem cell transplantation (ASCT), recovered  Current regimen: - Doxycycline  Clinically relatively stable overall. Same pattern as in recent years - subjective slow worsening of symptoms without much objective evidence of such. Cardiac markers of disease (biomarkers and TTE) have looked better than ever until now (pending today) and urine studies have also looked great. All restaging pending today but I assume this pattern is continuing today.  Reviewed the fact that his subjective symptoms of reduced exercise tolerance and stamina are tough to discern. Yes, the could be due to progressive AL but that would be unusual given the improvement in the objective measures of organ impairment. The other possibility is chronic fatigue due to issues not directly secondary to AL, such as aging, ongoing stressors, and prior therapy.  Again reviewed the fact that given that I can't prove his progressive symptoms aren't due to AL, I can't say for certain that therapy wouldn't help, and if he wanted to try something, I'd offer daratumumab monotherapy. To that end, we reviewed that medication at length including  rationale for using it, its recent FDA approval in [place], schedule of administration, and side effect profile. In short, I think it would quite likely work and be tolerable, but it's therapy with a built-in risk of cost, inconvenience, and side effects.  Ultimately he wants to monitor, which I agree with.  Will presumably remain on life-long anticoagulation for prior PE.  I personally spent 61 minutes face-to-face and non-face-to-face in the care of this patient, which includes all pre, intra, and post visit time on the date of service.  PLAN   - Continue monitoring off therapy except doxycycline, which he'll continue - RTC 6 months to see me, alternating with Dr. Windy Carina A. Amalia Hailey, MD, MHS Director, Multiple Myeloma and Amyloidosis Program Clinical Associate Professor of Medicine [phone] (office) [phone] (fax) [email or web address]   Patient reporting that for the last 48 hours has had increased cough and congestion with hemoptysis.  He had bright red blood yesterday.  His got a little bit darker over the last 24 hours.  He reports that he did feel under the weather and felt like he had some increased nasal congestion a week prior.  Denies fevers.  Denies discolored mucus or recent antibiotic use.  Patient has received the Covid vaccine in January/2021.  Patient denies chest pain, heart racing or leg swelling or unilateral leg swelling with leg pain.  Patient is not currently on any blood thinners.  Patient denies any back pain.  Or chest wall pain.  Vital signs today are stable.  Walk today in office patient did not have any oxygen desaturations  on room air.  Chest x-ray today is clear and stable.  Well score 2.5.  Wells Criteria Modified Wells criteria: Clinical assessment for PE Clinical symptoms of DVT (leg swelling, pain with palpation) - 3 Other diagnoses less likely than pulmonary embolism - 3 Heart rate greater than 100 - 1.5 Immobilization (disease  greater in 3 days) or surgery in the previous 4 weeks - 1.5 Previous DVT/PE - 1.5 Hemoptysis - 1 Malignancy - 1  Probability Traditional clinical probability assessment (Wells criteria) High - greater than 6 Moderate - 2 to 6 Low less than 2  Simplify clinical probability assessment (modified Wells criteria) PE likely-greater than 4 PE unlikely little less than or equal to 4   Questionaires / Pulmonary Flowsheets:   ACT:  No flowsheet data found.  MMRC: No flowsheet data found.  Epworth:  No flowsheet data found.  Tests:   Chest Imaging: CT chest January 2018 completed at [facility] visible in care everywhere. Multiple stable bilateral calcified and noncalcified pulmonary nodules.  Largest at 0.8 cm.  Able to review the report however not able to review the images.  CT chest January 2021: Images reviewed no evidence of lung nodule, resolution.  Basilar atelectasis, no acute process.  Pulmonary Functions Testing Results: No flowsheet data found.  FeNO: None   Pathology: None   Echocardiogram:   Heart Catheterization:  ECHO 08/28/2019 @ [facility] Summary  1. The left ventricle is normal in size with upper normal wall thickness.  2. Normal left ventricular systolic function, ejection fraction 55-60%.  3. Diastolic dysfunction - grade II (elevated filling pressures).  4. Normal right ventricular size and systolic function.  5. Overall stable to previous echo.    September 2020: [place]:  No results found for: NITRICOXIDE  PFT: No flowsheet data found.  WALK:  No flowsheet data found.  Imaging: DG Chest 2 View  Result Date: 03/17/2020 CLINICAL DATA:  Hemoptysis. EXAM: CHEST - 2 VIEW COMPARISON:  CT chest dated December 04, 2019. Chest x-ray dated April 24, 2019. FINDINGS: The heart size and mediastinal contours are within normal limits. Normal pulmonary vascularity. No focal consolidation, pleural effusion, or pneumothorax. Unchanged  bibasilar scarring. No acute osseous abnormality. IMPRESSION: No active cardiopulmonary disease. Electronically Signed   By: Titus Dubin M.D.   On: 03/17/2020 15:33    Lab Results:  CBC    Component Value Date/Time   WBC 9.2 03/17/2020 1605   RBC 4.34 03/17/2020 1605   HGB 14.1 03/17/2020 1605   HGB 13.3 04/24/2019 0906   HGB 13.1 08/05/2016 0917   HCT 41.5 03/17/2020 1605   HCT 39.1 08/05/2016 0917   PLT 189.0 03/17/2020 1605   PLT 167 04/24/2019 0906   PLT 155 08/05/2016 0917   MCV 95.5 03/17/2020 1605   MCV 93.3 08/05/2016 0917   MCH 32.0 04/24/2019 0906   MCHC 33.9 03/17/2020 1605   RDW 14.1 03/17/2020 1605   RDW 13.9 08/05/2016 0917   LYMPHSABS 2.4 03/17/2020 1605   LYMPHSABS 1.9 08/05/2016 0917   MONOABS 0.9 03/17/2020 1605   MONOABS 0.8 08/05/2016 0917   EOSABS 0.1 03/17/2020 1605   EOSABS 0.1 08/05/2016 0917   BASOSABS 0.1 03/17/2020 1605   BASOSABS 0.0 08/05/2016 0917    BMET    Component Value Date/Time   NA 140 04/24/2019 0906   NA 140 01/29/2016 0831   K 4.7 04/24/2019 0906   K 4.4 01/29/2016 0831   CL 107 04/24/2019 0906  CL 106 03/14/2013 1304   CO2 26 04/24/2019 0906   CO2 24 01/29/2016 0831   GLUCOSE 102 (H) 04/24/2019 0906   GLUCOSE 105 01/29/2016 0831   GLUCOSE 94 03/14/2013 1304   BUN 20 04/24/2019 0906   BUN 21.5 01/29/2016 0831   CREATININE 1.32 (H) 04/24/2019 0906   CREATININE 1.3 01/29/2016 0831   CALCIUM 8.8 (L) 04/24/2019 0906   CALCIUM 9.1 01/29/2016 0831   GFRNONAA 53 (L) 04/24/2019 0906   GFRAA >60 04/24/2019 0906    BNP    Component Value Date/Time   BNP 250.7 (H) 03/15/2013 1202    ProBNP No results found for: PROBNP  Specialty Problems      Pulmonary Problems   OSA (obstructive sleep apnea)    Severe OSA with AHI 44.4 events per hour with successful CPAP titration to 11cm H2O with medium Respironics nuance pro gel nasal pillow mask      Hemoptysis      Allergies  Allergen Reactions  . Enoxaparin Other  (See Comments)    Blood clots HIT [SRA +]  . Heparin Other (See Comments)    Heparin induced thrombocytopenia (SRA +)    Immunization History  Administered Date(s) Administered  . DTaP / Hep B / IPV 03/13/2014, 05/14/2014, 07/15/2014  . HiB (PRP-T) 03/13/2014, 05/14/2014, 07/15/2014  . Influenza Split 09/01/2011, 12/12/2012  . Influenza, High Dose Seasonal PF 08/28/2017, 08/22/2019  . Influenza,inj,Quad PF,6+ Mos 08/21/2013, 09/03/2014, 08/11/2015, 08/05/2016, 09/11/2018  . Influenza-Unspecified 07/23/2016, 08/28/2017  . MMR 02/26/2015  . Pneumococcal Conjugate-13 03/13/2014, 05/14/2014, 07/15/2014  . Pneumococcal Polysaccharide-23 09/09/2009, 02/26/2015   covid vaccines in jan/2021   Past Medical History:  Diagnosis Date  . AL amyloidosis ([place]) 11/04/2012  . Arthritis    lumbar DDD  . Blood dyscrasia    plasma cell dyscrasia with associated amyloidosis  . Complication of anesthesia   . DVT (deep venous thrombosis) ([place])    right  . Dyslipidemia   . H/O multiple myeloma   . Heparin induced thrombocytopenia (HIT) ([place]) 11/04/2012  . Joint pain   . Peripheral vascular disease ([place])    DVT- 01/2012, follows by Dr. [name], lovenox maintained since Spring 2013 , pt. aware that last dose is sch. for 07/22/2012  . Pneumonia    hx of with last time in Nov 2012  . Prostate cancer ([place])    amyloidosis, multiple myeloma   . Sleep apnea    severe with AHI 44.4 events per hour - now on CPAP at 11cm H2O  . Spastic quadriparesis ([place])     Tobacco History: Social History   Tobacco Use  Smoking Status Former Smoker  . Packs/day: 0.50  . Years: 10.00  . Pack years: 5.00  . Types: Cigarettes  . Quit date: 07/21/1979  . Years since quitting: 40.6  Smokeless Tobacco Never Used  Tobacco Comment   quit 25+yrs ago   Counseling given: Yes Comment: quit 25+yrs ago   Continue to not smoke  Outpatient Encounter Medications as of 03/17/2020  Medication Sig  . diphenhydrAMINE  (SOMINEX) 25 MG tablet Take 25 mg by mouth as needed.  . furosemide (LASIX) 20 MG tablet Take 40 mg by mouth 3 days. PRN  . spironolactone (ALDACTONE) 25 MG tablet Take 25 mg by mouth daily.  . benzonatate (TESSALON) 200 MG capsule Take 1 capsule (200 mg total) by mouth 3 (three) times daily as needed for cough.  . [DISCONTINUED] doxycycline (DORYX) 100 MG EC tablet Take 100 mg by mouth 2 (  two) times daily.    No facility-administered encounter medications on file as of 03/17/2020.     Review of Systems  Review of Systems  Constitutional: Negative for activity change, chills, fatigue, fever and unexpected weight change.  HENT: Negative for postnasal drip, rhinorrhea, sinus pressure, sinus pain and sore throat.   Eyes: Negative.   Respiratory: Positive for cough (hemoptysis). Negative for shortness of breath and wheezing.   Cardiovascular: Negative for chest pain and palpitations.  Gastrointestinal: Negative for constipation, diarrhea, nausea and vomiting.  Endocrine: Negative.   Genitourinary: Negative.   Musculoskeletal: Negative.   Skin: Negative.   Neurological: Negative for dizziness and headaches.  Psychiatric/Behavioral: Negative.  Negative for dysphoric mood. The patient is not nervous/anxious.   All other systems reviewed and are negative.    Physical Exam  BP 110/72   Pulse 72   Temp 98.6 F (37 C) (Temporal)   Ht 6' (1.829 m)   Wt 203 lb 1.6 oz (92.1 kg)   SpO2 100% Comment: on RA  BMI 27.55 kg/m   Wt Readings from Last 5 Encounters:  03/17/20 203 lb 1.6 oz (92.1 kg)  12/05/19 206 lb (93.4 kg)  11/20/19 204 lb 1.6 oz (92.6 kg)  09/05/19 201 lb (91.2 kg)  06/25/19 198 lb 11.2 oz (90.1 kg)    BMI Readings from Last 5 Encounters:  03/17/20 27.55 kg/m  12/05/19 27.94 kg/m  11/20/19 26.93 kg/m  09/05/19 27.26 kg/m  06/25/19 26.95 kg/m     Physical Exam Vitals and nursing note reviewed.  Constitutional:      General: He is not in acute distress.     Appearance: Normal appearance. He is normal weight.  HENT:     Head: Normocephalic and atraumatic.     Right Ear: Hearing and external ear normal.     Left Ear: Hearing and external ear normal.     Nose: Congestion and rhinorrhea present. No mucosal edema.     Right Turbinates: Not enlarged.     Left Turbinates: Not enlarged.     Mouth/Throat:     Mouth: Mucous membranes are dry.     Pharynx: Oropharynx is clear. No oropharyngeal exudate.     Comments: Postnasal drip Eyes:     Pupils: Pupils are equal, round, and reactive to light.  Cardiovascular:     Rate and Rhythm: Normal rate and regular rhythm.     Pulses: Normal pulses.     Heart sounds: Normal heart sounds. No murmur.  Pulmonary:     Effort: Pulmonary effort is normal.     Breath sounds: Normal breath sounds. No decreased breath sounds, wheezing or rales.  Abdominal:     General: Bowel sounds are normal. There is no distension.     Palpations: Abdomen is soft.     Tenderness: There is no abdominal tenderness.  Musculoskeletal:     Cervical back: Normal range of motion.     Right lower leg: No edema.     Left lower leg: No edema.  Lymphadenopathy:     Cervical: No cervical adenopathy.  Skin:    General: Skin is warm and dry.     Capillary Refill: Capillary refill takes less than 2 seconds.     Findings: No erythema or rash.  Neurological:     General: No focal deficit present.     Mental Status: He is alert and oriented to person, place, and time.     Motor: No weakness.     Coordination: Coordination  normal.     Gait: Gait is intact. Gait normal.  Psychiatric:        Mood and Affect: Mood normal.        Behavior: Behavior normal. Behavior is cooperative.        Thought Content: Thought content normal.        Judgment: Judgment normal.       Assessment & Plan:   Hemoptysis History of hemoptysis Known amyloidosis Not on any blood thinners Wells score 2.5 Chest x-ray clear today Vital signs stable  today  Discussion: Likely bronchial irritation.  We will continue to monitor.  Will encourage cough suppression today.  We will keep close follow-up with patient with a 1 week follow-up to monitor hemoptysis.  Counseled patient that if symptoms should worsen such as chest pain, increased shortness of breath, increased amount of this to contact her office sooner or seek emergent evaluation.  I will also discuss the case with Dr. Valeta Harms.  Plan: Tessalon Perles today Can use Delsym over-the-counter Chest x-ray today clear Walk today in office clear Lab work today If symptoms worsen seek emergent evaluation 1 week follow-up with our office   AL amyloidosis Plan:  Keep follow up with Dr. [name] and [facility] health   Pulmonary embolism Hx of  Wells score today 2.5 Vital signs stable, no oxygen desaturations on walk today Chest x-ray clear No leg pain, unilateral leg swelling or chest pain or heart racing  Plan: We will continue to clinically monitor  Multiple myeloma Plan:  Keep follow up with Dr. [name] and [facility] health     Return in about 1 week (around 03/24/2020), or if symptoms worsen or fail to improve, for Follow up with Wyn Quaker FNP-C, Follow up with Dr. Valeta Harms.   Lauraine Rinne, NP 03/17/2020   This appointment required 44 minutes of patient care (this includes precharting, chart review, review of results, face-to-face care, etc.).

## 2020-03-24 ENCOUNTER — Other Ambulatory Visit: Payer: Self-pay

## 2020-03-24 ENCOUNTER — Encounter: Payer: Self-pay | Admitting: Pulmonary Disease

## 2020-03-24 ENCOUNTER — Ambulatory Visit (INDEPENDENT_AMBULATORY_CARE_PROVIDER_SITE_OTHER): Payer: Medicare Other | Admitting: Pulmonary Disease

## 2020-03-24 DIAGNOSIS — R042 Hemoptysis: Secondary | ICD-10-CM

## 2020-03-24 NOTE — Assessment & Plan Note (Signed)
No more episodes of hemoptysis Improved on cough suppression Still not maintained on any blood thinners Patient reports that he is doing well without any acute complaints today  Plan: Continue cough suppression Continue to clinically monitor 5-month follow-up with Dr. Valeta Harms

## 2020-03-24 NOTE — Progress Notes (Signed)
Virtual Visit via Telephone Note  I connected with Brent Allison on 03/24/20 at  4:00 PM EDT by telephone and verified that I am speaking with the correct person using two identifiers.  Location: Patient: Home Provider: [facility] - [address], [place], [place], [place] [address]   I discussed the limitations, risks, security and privacy concerns of performing an evaluation and management service by telephone and the availability of in person appointments. I also discussed with the patient that there may be a patient responsible charge related to this service. The patient expressed understanding and agreed to proceed.  Patient consented to consult via telephone: Yes People present and their role in pt care: Pt   History of Present Illness:  74 year old male former smoker initially consulted with our practice in October/2020 for amyloidosis  PMH: Dyslipidemia, multiple myeloma, history of DVT, history of PE Smoker/ Smoking History: Former Smoker Maintenance: None Pt of: Dr. Valeta Harms  Chief complaint: 1 week follow-up  Patient completing 1 week telephonic follow-up for hemoptysis.  Patient reports that symptoms have improved since last being seen.  He has worked on cough suppression by using Delsym as well as Best boy.  He has had some increased postnasal drip is now is taking a daily antihistamine.  He reports no acute complaints today.   Observations/Objective:  Social History   Tobacco Use  Smoking Status Former Smoker  . Packs/day: 0.50  . Years: 10.00  . Pack years: 5.00  . Types: Cigarettes  . Quit date: 07/21/1979  . Years since quitting: 40.7  Smokeless Tobacco Never Used  Tobacco Comment   quit 25+yrs ago   Immunization History  Administered Date(s) Administered  . DTaP / Hep B / IPV 03/13/2014, 05/14/2014, 07/15/2014  . HiB (PRP-T) 03/13/2014, 05/14/2014, 07/15/2014  . Influenza Split 09/01/2011, 12/12/2012  . Influenza, High Dose  Seasonal PF 08/28/2017, 08/22/2019  . Influenza,inj,Quad PF,6+ Mos 08/21/2013, 09/03/2014, 08/11/2015, 08/05/2016, 09/11/2018  . Influenza-Unspecified 07/23/2016, 08/28/2017  . MMR 02/26/2015  . Pneumococcal Conjugate-13 03/13/2014, 05/14/2014, 07/15/2014  . Pneumococcal Polysaccharide-23 09/09/2009, 02/26/2015    Assessment and Plan:  Hemoptysis No more episodes of hemoptysis Improved on cough suppression Still not maintained on any blood thinners Patient reports that he is doing well without any acute complaints today  Plan: Continue cough suppression Continue to clinically monitor 72-monthfollow-up with Dr. IValeta Harms  Follow Up Instructions:  Return in about 3 months (around 06/24/2020), or if symptoms worsen or fail to improve, for Follow up with Dr. IValeta Harms   I discussed the assessment and treatment plan with the patient. The patient was provided an opportunity to ask questions and all were answered. The patient agreed with the plan and demonstrated an understanding of the instructions.   The patient was advised to call back or seek an in-person evaluation if the symptoms worsen or if the condition fails to improve as anticipated.  I provided 11 minutes of non-face-to-face time during this encounter.   BLauraine Rinne NP

## 2020-03-24 NOTE — Patient Instructions (Addendum)
You were seen today by Lauraine Rinne, NP  for:   1. Hemoptysis  Continue Tessalon Perles as needed for cough suppression  Continue Delsym as needed for cough suppression  32-month follow-up with Dr. Agnes Lawrence our office if you have any more episodes of hemoptysis (coughing up blood)  Follow Up:    Return in about 3 months (around 06/24/2020), or if symptoms worsen or fail to improve, for Follow up with Dr. Valeta Harms.   Please do your part to reduce the spread of COVID-19:      Reduce your risk of any infection  and COVID19 by using the similar precautions used for avoiding the common cold or flu:  Marland Kitchen Wash your hands often with soap and warm water for at least 20 seconds.  If soap and water are not readily available, use an alcohol-based hand sanitizer with at least 60% alcohol.  . If coughing or sneezing, cover your mouth and nose by coughing or sneezing into the elbow areas of your shirt or coat, into a tissue or into your sleeve (not your hands). Langley Gauss A MASK when in public  . Avoid shaking hands with others and consider head nods or verbal greetings only. . Avoid touching your eyes, nose, or mouth with unwashed hands.  . Avoid close contact with people who are sick. . Avoid places or events with large numbers of people in one location, like concerts or sporting events. . If you have some symptoms but not all symptoms, continue to monitor at home and seek medical attention if your symptoms worsen. . If you are having a medical emergency, call 911.   [place] / e-Visit: [email or web address]         [facility]: [facility]: [id number]                   [facility]: [id number]     It is flu season:   >>> Best ways to protect herself from the flu: Receive the yearly flu vaccine, practice good hand hygiene washing with  soap and also using hand sanitizer when available, eat a nutritious meals, get adequate rest, hydrate appropriately   Please contact the office if your symptoms worsen or you have concerns that you are not improving.   Thank you for choosing [facility] for your healthcare, and for allowing [place] to partner with you on your healthcare journey. I am thankful to be able to provide care to you today.   Wyn Quaker FNP-C

## 2020-03-31 NOTE — Progress Notes (Signed)
Glad he is doing better. Thanks for the updates Garner Nash, DO [facility] 03/31/2020 4:59 PM

## 2020-05-20 ENCOUNTER — Other Ambulatory Visit: Payer: Self-pay

## 2020-05-20 ENCOUNTER — Inpatient Hospital Stay: Payer: Medicare Other | Attending: Oncology | Admitting: Oncology

## 2020-05-20 ENCOUNTER — Telehealth: Payer: Self-pay | Admitting: Oncology

## 2020-05-20 VITALS — BP 122/62 | HR 74 | Temp 97.8°F | Resp 18 | Ht 72.0 in | Wt 200.0 lb

## 2020-05-20 DIAGNOSIS — G4730 Sleep apnea, unspecified: Secondary | ICD-10-CM | POA: Diagnosis not present

## 2020-05-20 DIAGNOSIS — R5383 Other fatigue: Secondary | ICD-10-CM | POA: Diagnosis not present

## 2020-05-20 DIAGNOSIS — G629 Polyneuropathy, unspecified: Secondary | ICD-10-CM | POA: Diagnosis not present

## 2020-05-20 DIAGNOSIS — Z8546 Personal history of malignant neoplasm of prostate: Secondary | ICD-10-CM | POA: Insufficient documentation

## 2020-05-20 DIAGNOSIS — Z8701 Personal history of pneumonia (recurrent): Secondary | ICD-10-CM | POA: Diagnosis not present

## 2020-05-20 DIAGNOSIS — R0609 Other forms of dyspnea: Secondary | ICD-10-CM | POA: Insufficient documentation

## 2020-05-20 DIAGNOSIS — E785 Hyperlipidemia, unspecified: Secondary | ICD-10-CM | POA: Insufficient documentation

## 2020-05-20 DIAGNOSIS — E8581 Light chain (AL) amyloidosis: Secondary | ICD-10-CM | POA: Insufficient documentation

## 2020-05-20 DIAGNOSIS — Z86711 Personal history of pulmonary embolism: Secondary | ICD-10-CM | POA: Diagnosis not present

## 2020-05-20 NOTE — Progress Notes (Signed)
Brent Allison OFFICE PROGRESS NOTE   Diagnosis: Amyloidosis  INTERVAL HISTORY:   Brent Allison returns as scheduled.  He saw Brent Allison on 01/29/2020.  He appears stable.  Continuation of doxycycline was recommended.  He reports increased exertional dyspnea and leg weakness.  He is able to ambulate and work in his yard.  He had an episode of hemoptysis in May.  He saw pulmonary medicine.  The hemoptysis resolved.  Objective:  Vital signs in last 24 hours:  Blood pressure 122/62, pulse 74, temperature 97.8 F (36.6 C), temperature source Temporal, resp. rate 18, height 6' (1.829 m), weight 200 lb (90.7 kg), SpO2 100 %.    HEENT: Oral cavity without ecchymoses Resp: Lungs clear bilaterally Cardio: Regular rate and rhythm GI: No hepatosplenomegaly Vascular: No leg edema Neuro: Mild weakness with abduction at the left shoulder   Lab Results:  Lab Results  Component Value Date   WBC 9.2 03/17/2020   HGB 14.1 03/17/2020   HCT 41.5 03/17/2020   MCV 95.5 03/17/2020   PLT 189.0 03/17/2020   NEUTROABS 5.6 03/17/2020    CMP  Lab Results  Component Value Date   NA 140 04/24/2019   K 4.7 04/24/2019   CL 107 04/24/2019   CO2 26 04/24/2019   GLUCOSE 102 (H) 04/24/2019   BUN 20 04/24/2019   CREATININE 1.32 (H) 04/24/2019   CALCIUM 8.8 (L) 04/24/2019   PROT 6.3 (L) 04/24/2019   ALBUMIN 3.3 (L) 04/24/2019   AST 16 04/24/2019   ALT 12 04/24/2019   ALKPHOS 92 04/24/2019   BILITOT 0.4 04/24/2019   GFRNONAA 53 (L) 04/24/2019   GFRAA >60 04/24/2019     Medications: I have reviewed the patient's current medications.   Assessment/Plan: 1. 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 2. "Bruising" at the eyelids and mouth: Likely related to amyloidosis, persistent. 3. Numbness and loss of vibratory sense at the fingertips: This predated Velcade-based therapy, but worsened. Now much improved following cervical spine surgery. 4. Elevated serum free lambda light  chains. The lambda light chains were lower on November 16 and slightly higher on 12/14/2011. 5. Lambda light chain proteinuria. 6. Bone marrow plasmacytosis: Variable increase in plasma cells noted on the bone marrow biopsy 07/29/2011 with plasma cells estimated to represent between 4% and 20% of the cellular population. 7. Remote history of prostate cancer. 8. Sleep apnea. 9. Dyslipidemia. 10. Report of pneumonia on 2 occasions in 2011. 11. Admission to a hospital in [place], [place] October 2012 with "pneumonia." A chest x-ray at [facility] on November 2 was negative .  12. Low serum immunoglobulin G level. 13. Plasma cell dyscrasia with associated amyloidosis.  1. Initiation of systemic therapy with Cytoxan, Velcade, and Decadron 08/12/2011. Cycle #2 was initiated on 09/16/2011. Cycle #3 was initiated on 10/15/2011. Velcade was placed on hold due to neuropathy. 2. The serum free lambda light chains were decreased on October 08 2011. 3. The serum free lambda light chains were slightly increased on 12/14/2011, lower on 12/29/2011 and 02/02/2012. 4. Initiation of Revlimid/Decadron February 2013, cycle 2 started on 01/28/2012. 5. High-dose melphalan chemotherapy followed by autologous stem cell infusion on 02/09/2013 6. Initiation of Velcade/Decadrontherapy 05/28/2013, he completed 2 cycles on a day 1, 4, 8, 11 schedule 7. Improvement in the serum free lambda light chains following induction Velcade/Decadron  8. Initiation of weekly Velcade/Decadron on 07/16/2013, last treatment on 10/16/2013 9. Improvement in the serum free lambda light chains on 08/21/2013 and 09/18/2013 ([facility]) 10. Normal serum  free lambda light chains on 12/13/2013 11. Mild increase of the serum free lambda light chains beginning January 2016-stable 12. treatment on the Pronto study at [facility] beginning 12/17/2015, in of study 11/23/2016  Started on phase 2b Prothenastudy-long-term safety/efficacy study  01/11/2017,discontinued May 2018  Doxycycline started 05/17/2017 15. Loss of "balance "and proximal motor weakness secondary to cervical stenosis-status post decompression surgery on 02/19/2012. Improved, but not resolved stenosis with mass effect on the spinal cord noted on a repeat MRI 03/28/2012. He underwent further decompression surgery on 04/06/2012. The ataxia, weakness, and peripheral numbness is much improved.  16. Right lower Extremity deep vein thrombosis 02/02/2012-a Doppler ultrasound confirmed a gastrocnemius and peroneal deep vein thrombosis. An IVC filter was placed prior to surgery and he was maintained on Lovenox. The IVC filter was removed on 05/04/2012.  17. Anorexia following cervical spine surgery. Improved.  18. Episode of flank pain and hematuria in 03/31/2012-etiology unclear. He completed a course of antibiotics. The hematuria and pain have resolved.  19. Rectal wall thickening noted on a CT of the pelvis 03/31/2012.  20. colonoscopy showed an area of abnormality at the rectum with biopsy positive for amyloid.  21. Lung nodule noted on the CT 03/31/2012 , no lung nodule on a chest CT 07/30/2013  22. Lumbar stenosis-status post surgery on 07/31/2012.  23. Status post stem cell collection.  24.Rright internal jugular thrombosis, 10/22/2012, likely related to pheresis catheter and HIT  25. Admission with acute right pulmonary embolism 10/27/2012  26. Superior mesenteric vein/portal vein thrombosis diagnosed on a CT of the abdomen 10/28/2012 and 10/29/2012-treated with Argatroban , maintained on Coumadin anticoagulation until he hospital admission March 2014 for stem cell therapy  27. HIT confirmed on a serotonin release assay 10/29/2012  28. History of Renal insufficiency during the December 2013 hospital admission-likely related to dehydration and? Contrast nephropathy , persistent mild elevation of the creatinine  29. Dysphagia-? Secondary to amyloidosis,  reported when he was here on 05/08/2013. He did not complain of dysphagia today  30. Anemia secondary to multiple myeloma and chemotherapy , improved  31.right lower Extremity deep vein thrombosis diagnosed at [facility] July 2014 , repeat Doppler at [facility] 07/19/2013 with chronic thrombus in the right gastrocnemius, Eliquis has been discontinued  32. Fall August 2014 with a left shoulder injury, diagnosed with a rotator cuff tear, status post surgical repair 09/19/2013  33. 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 The neuropathy symptoms have partially improved since Velcade was discontinued after treatment 10/16/2013  34. Cardiac MRI 05/22/2015-normal left ventricular size with severe concentric hypertrophy and mildly impaired systolic function, TSVX-79%, right ventricular hypertrophy, diffuse late gadolinium enhancement in the left and right ventricles-findings consistent with cardiac amyloidosis. Echocardiogram at [facility]-LVEF 50%  Echocardiogram at [facility] 04/20/2018 - left ventricular hypertrophy, LVEF 55-60%  Echocardiogram at [facility] 08/28/2019-left ventricular ejection fraction 55-60%, left ventricle normal in size with upper normal wall thickness 35. Multinodular goiter confirmed on thyroid ultrasound at [facility] 06/29/2016, amyloidosis of the thyroid confirmed at [facility] 04/20/2018    Disposition: Brent Allison appears unchanged, but he reports progressive fatigue, exertional dyspnea, and leg weakness.  His symptoms may be related to aging, benign disease in the spine, or progression of the amyloidosis.  He is scheduled to see Brent Allison and Dr. Karle Barr in September.  He will return for an office visit here in approximately 5 months.  I am available to see him sooner for increased symptoms or if Brent Allison recommends initiating salvage therapy for the amyloidosis.  Dominica Severin  [name], MD  05/20/2020  9:19 AM

## 2020-05-20 NOTE — Telephone Encounter (Signed)
Scheduled per 6/29 los. Pt is aware of appt time and date. No avs or calendar needed

## 2020-06-30 DIAGNOSIS — D2272 Melanocytic nevi of left lower limb, including hip: Secondary | ICD-10-CM | POA: Diagnosis not present

## 2020-06-30 DIAGNOSIS — L821 Other seborrheic keratosis: Secondary | ICD-10-CM | POA: Diagnosis not present

## 2020-06-30 DIAGNOSIS — L814 Other melanin hyperpigmentation: Secondary | ICD-10-CM | POA: Diagnosis not present

## 2020-06-30 DIAGNOSIS — D225 Melanocytic nevi of trunk: Secondary | ICD-10-CM | POA: Diagnosis not present

## 2020-06-30 DIAGNOSIS — Z85828 Personal history of other malignant neoplasm of skin: Secondary | ICD-10-CM | POA: Diagnosis not present

## 2020-06-30 DIAGNOSIS — D1801 Hemangioma of skin and subcutaneous tissue: Secondary | ICD-10-CM | POA: Diagnosis not present

## 2020-06-30 DIAGNOSIS — L570 Actinic keratosis: Secondary | ICD-10-CM | POA: Diagnosis not present

## 2020-06-30 DIAGNOSIS — B078 Other viral warts: Secondary | ICD-10-CM | POA: Diagnosis not present

## 2020-06-30 DIAGNOSIS — L853 Xerosis cutis: Secondary | ICD-10-CM | POA: Diagnosis not present

## 2020-08-12 DIAGNOSIS — Z9484 Stem cells transplant status: Secondary | ICD-10-CM | POA: Diagnosis not present

## 2020-08-12 DIAGNOSIS — G737 Myopathy in diseases classified elsewhere: Secondary | ICD-10-CM | POA: Diagnosis not present

## 2020-08-12 DIAGNOSIS — Z6827 Body mass index (BMI) 27.0-27.9, adult: Secondary | ICD-10-CM | POA: Diagnosis not present

## 2020-08-12 DIAGNOSIS — R0600 Dyspnea, unspecified: Secondary | ICD-10-CM | POA: Diagnosis not present

## 2020-08-12 DIAGNOSIS — I5032 Chronic diastolic (congestive) heart failure: Secondary | ICD-10-CM | POA: Diagnosis not present

## 2020-08-12 DIAGNOSIS — I82409 Acute embolism and thrombosis of unspecified deep veins of unspecified lower extremity: Secondary | ICD-10-CM | POA: Diagnosis not present

## 2020-08-12 DIAGNOSIS — Z9481 Bone marrow transplant status: Secondary | ICD-10-CM | POA: Diagnosis not present

## 2020-08-12 DIAGNOSIS — E8581 Light chain (AL) amyloidosis: Secondary | ICD-10-CM | POA: Diagnosis not present

## 2020-08-12 DIAGNOSIS — G4730 Sleep apnea, unspecified: Secondary | ICD-10-CM | POA: Diagnosis not present

## 2020-08-12 DIAGNOSIS — E785 Hyperlipidemia, unspecified: Secondary | ICD-10-CM | POA: Diagnosis not present

## 2020-08-12 DIAGNOSIS — G6289 Other specified polyneuropathies: Secondary | ICD-10-CM | POA: Diagnosis not present

## 2020-08-12 DIAGNOSIS — E850 Non-neuropathic heredofamilial amyloidosis: Secondary | ICD-10-CM | POA: Diagnosis not present

## 2020-08-12 DIAGNOSIS — Z888 Allergy status to other drugs, medicaments and biological substances status: Secondary | ICD-10-CM | POA: Diagnosis not present

## 2020-08-22 DIAGNOSIS — E854 Organ-limited amyloidosis: Secondary | ICD-10-CM | POA: Diagnosis not present

## 2020-08-22 DIAGNOSIS — I43 Cardiomyopathy in diseases classified elsewhere: Secondary | ICD-10-CM | POA: Diagnosis not present

## 2020-08-22 DIAGNOSIS — Z6827 Body mass index (BMI) 27.0-27.9, adult: Secondary | ICD-10-CM | POA: Diagnosis not present

## 2020-09-01 DIAGNOSIS — G589 Mononeuropathy, unspecified: Secondary | ICD-10-CM | POA: Diagnosis not present

## 2020-09-01 DIAGNOSIS — M6281 Muscle weakness (generalized): Secondary | ICD-10-CM | POA: Diagnosis not present

## 2020-09-01 DIAGNOSIS — G737 Myopathy in diseases classified elsewhere: Secondary | ICD-10-CM | POA: Diagnosis not present

## 2020-09-01 DIAGNOSIS — R799 Abnormal finding of blood chemistry, unspecified: Secondary | ICD-10-CM | POA: Diagnosis not present

## 2020-09-01 DIAGNOSIS — M5417 Radiculopathy, lumbosacral region: Secondary | ICD-10-CM | POA: Diagnosis not present

## 2020-09-01 DIAGNOSIS — E850 Non-neuropathic heredofamilial amyloidosis: Secondary | ICD-10-CM | POA: Diagnosis not present

## 2020-09-03 ENCOUNTER — Telehealth: Payer: Self-pay | Admitting: Pulmonary Disease

## 2020-09-05 NOTE — Telephone Encounter (Signed)
Called and spoke with Patient.  Patient scheduled 09/08/20 at 0930 for high dose flu vaccine.

## 2020-09-08 ENCOUNTER — Ambulatory Visit (INDEPENDENT_AMBULATORY_CARE_PROVIDER_SITE_OTHER): Payer: Medicare Other

## 2020-09-08 ENCOUNTER — Other Ambulatory Visit: Payer: Self-pay

## 2020-09-08 DIAGNOSIS — Z23 Encounter for immunization: Secondary | ICD-10-CM

## 2020-09-12 DIAGNOSIS — M47816 Spondylosis without myelopathy or radiculopathy, lumbar region: Secondary | ICD-10-CM | POA: Diagnosis not present

## 2020-09-24 DIAGNOSIS — Z23 Encounter for immunization: Secondary | ICD-10-CM | POA: Diagnosis not present

## 2020-09-25 DIAGNOSIS — Q998 Other specified chromosome abnormalities: Secondary | ICD-10-CM | POA: Diagnosis not present

## 2020-09-25 DIAGNOSIS — R5383 Other fatigue: Secondary | ICD-10-CM | POA: Diagnosis not present

## 2020-09-25 DIAGNOSIS — R531 Weakness: Secondary | ICD-10-CM | POA: Diagnosis not present

## 2020-10-20 ENCOUNTER — Inpatient Hospital Stay: Payer: Medicare Other | Attending: Oncology | Admitting: Oncology

## 2020-10-20 ENCOUNTER — Other Ambulatory Visit: Payer: Self-pay

## 2020-10-20 ENCOUNTER — Telehealth: Payer: Self-pay | Admitting: Oncology

## 2020-10-20 VITALS — BP 131/52 | HR 78 | Temp 98.1°F | Resp 17 | Ht 72.0 in | Wt 203.5 lb

## 2020-10-20 DIAGNOSIS — E785 Hyperlipidemia, unspecified: Secondary | ICD-10-CM | POA: Insufficient documentation

## 2020-10-20 DIAGNOSIS — G629 Polyneuropathy, unspecified: Secondary | ICD-10-CM | POA: Diagnosis not present

## 2020-10-20 DIAGNOSIS — R809 Proteinuria, unspecified: Secondary | ICD-10-CM | POA: Insufficient documentation

## 2020-10-20 DIAGNOSIS — Z8546 Personal history of malignant neoplasm of prostate: Secondary | ICD-10-CM | POA: Insufficient documentation

## 2020-10-20 DIAGNOSIS — Z86711 Personal history of pulmonary embolism: Secondary | ICD-10-CM | POA: Insufficient documentation

## 2020-10-20 DIAGNOSIS — E8581 Light chain (AL) amyloidosis: Secondary | ICD-10-CM | POA: Insufficient documentation

## 2020-10-20 DIAGNOSIS — R531 Weakness: Secondary | ICD-10-CM | POA: Diagnosis not present

## 2020-10-20 DIAGNOSIS — G4730 Sleep apnea, unspecified: Secondary | ICD-10-CM | POA: Insufficient documentation

## 2020-10-20 DIAGNOSIS — Z8701 Personal history of pneumonia (recurrent): Secondary | ICD-10-CM | POA: Insufficient documentation

## 2020-10-20 NOTE — Telephone Encounter (Signed)
Scheduled appointment per 11/29 los. Spoke to patient who is aware of appointment date and time. Gave patient calendar print out.

## 2020-10-20 NOTE — Progress Notes (Signed)
[place] OFFICE PROGRESS NOTE   Diagnosis: Amyloidosis  INTERVAL HISTORY:    BrentAllison returns as scheduled.  He continues to have intermittent back pain, improved when he takes a morning dose of pain medication prescribed by Dr. Amalia Hailey.  He continues to have generalized weakness.  No dyspnea. He is followed at [facility] by cardiology and by Dr. Amalia Hailey.  The BNP was improved in September. The myeloma panel revealed no significant change in the serum M spike or light chains. He underwent a negative neurology evaluation for myasthenia gravis.  Objective:  Vital signs in last 24 hours:  Blood pressure (!) 131/52, pulse 78, temperature 98.1 F (36.7 C), temperature source Tympanic, resp. rate 17, height 6' (1.829 m), weight 203 lb 8 oz (92.3 kg), SpO2 100 %.    HEENT: Macroglossia, oral cavity without bleeding Resp: Lungs clear bilaterally Cardio: Regular rate and rhythm GI: No hepatosplenomegaly, nontender Vascular: No leg edema Neuro: Weakness with abduction at the left shoulder, the arm and leg strength is otherwise intact Skin: Small ecchymosis at the right lateral eyelid    Lab Results:  Lab Results  Component Value Date   WBC 9.2 03/17/2020   HGB 14.1 03/17/2020   HCT 41.5 03/17/2020   MCV 95.5 03/17/2020   PLT 189.0 03/17/2020   NEUTROABS 5.6 03/17/2020    CMP  Lab Results  Component Value Date   NA 140 04/24/2019   K 4.7 04/24/2019   CL 107 04/24/2019   CO2 26 04/24/2019   GLUCOSE 102 (H) 04/24/2019   BUN 20 04/24/2019   CREATININE 1.32 (H) 04/24/2019   CALCIUM 8.8 (L) 04/24/2019   PROT 6.3 (L) 04/24/2019   ALBUMIN 3.3 (L) 04/24/2019   AST 16 04/24/2019   ALT 12 04/24/2019   ALKPHOS 92 04/24/2019   BILITOT 0.4 04/24/2019   GFRNONAA 53 (L) 04/24/2019   GFRAA >60 04/24/2019    Medications: I have reviewed the patient's current medications.   Assessment/Plan: 1. 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 2. "Bruising" at the  eyelids and mouth: Likely related to amyloidosis, persistent. 3. Numbness and loss of vibratory sense at the fingertips: This predated Velcade-based therapy, but worsened. Now much improved following cervical spine surgery. 4. Elevated serum free lambda light chains. The lambda light chains were lower on November 16 and slightly higher on 12/14/2011. 5. Lambda light chain proteinuria. 6. Bone marrow plasmacytosis: Variable increase in plasma cells noted on the bone marrow biopsy 07/29/2011 with plasma cells estimated to represent between 4% and 20% of the cellular population. 7. Remote history of prostate cancer. 8. Sleep apnea. 9. Dyslipidemia. 10. Report of pneumonia on 2 occasions in 2011. 11. Admission to a hospital in [place], [place] October 2012 with "pneumonia." A chest x-ray at [facility] on November 2 was negative .  12. Low serum immunoglobulin G level. 13. Plasma cell dyscrasia with associated amyloidosis.  1. Initiation of systemic therapy with Cytoxan, Velcade, and Decadron 08/12/2011. Cycle #2 was initiated on 09/16/2011. Cycle #3 was initiated on 10/15/2011. Velcade was placed on hold due to neuropathy. 2. The serum free lambda light chains were decreased on October 08 2011. 3. The serum free lambda light chains were slightly increased on 12/14/2011, lower on 12/29/2011 and 02/02/2012. 4. Initiation of Revlimid/Decadron February 2013, cycle 2 started on 01/28/2012. 5. High-dose melphalan chemotherapy followed by autologous stem cell infusion on 02/09/2013 6. Initiation of Velcade/Decadrontherapy 05/28/2013, he completed 2 cycles on a day 1, 4, 8, 11 schedule 7. Improvement  in the serum free lambda light chains following induction Velcade/Decadron  8. Initiation of weekly Velcade/Decadron on 07/16/2013, last treatment on 10/16/2013 9. Improvement in the serum free lambda light chains on 08/21/2013 and 09/18/2013 ([facility]) 10. Normal serum free lambda light chains on  12/13/2013 11. Mild increase of the serum free lambda light chains beginning January 2016-stable 12. treatment on the Pronto study at [place] beginning 12/17/2015, in of study 11/23/2016  Started on phase 2b Prothenastudy-long-term safety/efficacy study 01/11/2017,discontinued May 2018  Doxycycline started 05/17/2017 15. Loss of "balance "and proximal motor weakness secondary to cervical stenosis-status post decompression surgery on 02/19/2012. Improved, but not resolved stenosis with mass effect on the spinal cord noted on a repeat MRI 03/28/2012. He underwent further decompression surgery on 04/06/2012. The ataxia, weakness, and peripheral numbness is much improved.  16. Right lower Extremity deep vein thrombosis 02/02/2012-a Doppler ultrasound confirmed a gastrocnemius and peroneal deep vein thrombosis. An IVC filter was placed prior to surgery and he was maintained on Lovenox. The IVC filter was removed on 05/04/2012.  17. Anorexia following cervical spine surgery. Improved.  18. Episode of flank pain and hematuria in 03/31/2012-etiology unclear. He completed a course of antibiotics. The hematuria and pain have resolved.  19. Rectal wall thickening noted on a CT of the pelvis 03/31/2012.  20. colonoscopy showed an area of abnormality at the rectum with biopsy positive for amyloid.  21. Lung nodule noted on the CT 03/31/2012 , no lung nodule on a chest CT 07/30/2013  22. Lumbar stenosis-status post surgery on 07/31/2012.  23. Status post stem cell collection.  24.Rright internal jugular thrombosis, 10/22/2012, likely related to pheresis catheter and HIT  25. Admission with acute right pulmonary embolism 10/27/2012  26. Superior mesenteric vein/portal vein thrombosis diagnosed on a CT of the abdomen 10/28/2012 and 10/29/2012-treated with Argatroban , maintained on Coumadin anticoagulation until he hospital admission March 2014 for stem cell therapy  27. HIT confirmed on a  serotonin release assay 10/29/2012  28. History of Renal insufficiency during the December 2013 hospital admission-likely related to dehydration and? Contrast nephropathy , persistent mild elevation of the creatinine  29. Dysphagia-? Secondary to amyloidosis, reported when he was here on 05/08/2013. He did not complain of dysphagia today  30. Anemia secondary to multiple myeloma and chemotherapy , improved  31.right lower Extremity deep vein thrombosis diagnosed at [facility] July 2014 , repeat Doppler at [facility] 07/19/2013 with chronic thrombus in the right gastrocnemius, Eliquis has been discontinued  32. Fall August 2014 with a left shoulder injury, diagnosed with a rotator cuff tear, status post surgical repair 09/19/2013  33. 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 The neuropathy symptoms have partially improved since Velcade was discontinued after treatment 10/16/2013  34. Cardiac MRI 05/22/2015-normal left ventricular size with severe concentric hypertrophy and mildly impaired systolic function, POEU-23%, right ventricular hypertrophy, diffuse late gadolinium enhancement in the left and right ventricles-findings consistent with cardiac amyloidosis. Echocardiogram at [facility]-LVEF 50%  Echocardiogram at [facility] 04/20/2018 - left ventricular hypertrophy, LVEF 55-60%  Echocardiogram at [facility] 08/28/2019-left ventricular ejection fraction 55-60%, left ventricle normal in size with upper normal wall thickness 35. Multinodular goiter confirmed on thyroid ultrasound at [facility] 06/29/2016, amyloidosis of the thyroid confirmed at [facility] 04/20/2018     Disposition: Mr. Skora appears unchanged.  There is no clinical evidence for progression of myeloma/light chain amyloidosis.  The chronic back pain is most likely related to benign musculoskeletal disease.  The generalized weakness is likely related to progression of amyloid myopathy/neuropathy.  He continues clinical follow-up in cardiology and  with Dr. Amalia Hailey at [facility].  He will return for an office visit in 6 months.  He has received the Covid booster vaccine.  Betsy Coder, MD  10/20/2020  9:25 AM

## 2020-11-17 ENCOUNTER — Ambulatory Visit (INDEPENDENT_AMBULATORY_CARE_PROVIDER_SITE_OTHER): Payer: Medicare Other | Admitting: Pulmonary Disease

## 2020-11-17 ENCOUNTER — Other Ambulatory Visit: Payer: Self-pay

## 2020-11-17 VITALS — BP 118/62 | HR 64 | Ht 72.0 in | Wt 208.0 lb

## 2020-11-17 DIAGNOSIS — R042 Hemoptysis: Secondary | ICD-10-CM

## 2020-11-17 DIAGNOSIS — C9001 Multiple myeloma in remission: Secondary | ICD-10-CM | POA: Diagnosis not present

## 2020-11-17 DIAGNOSIS — G4733 Obstructive sleep apnea (adult) (pediatric): Secondary | ICD-10-CM

## 2020-11-17 DIAGNOSIS — E8581 Light chain (AL) amyloidosis: Secondary | ICD-10-CM

## 2020-11-17 DIAGNOSIS — Z9989 Dependence on other enabling machines and devices: Secondary | ICD-10-CM | POA: Diagnosis not present

## 2020-11-17 DIAGNOSIS — Z86711 Personal history of pulmonary embolism: Secondary | ICD-10-CM

## 2020-11-17 NOTE — Patient Instructions (Addendum)
Thank you for visiting Dr. Tonia Brooms at [facility]. Today we recommend the following:  Please set up with Please set patient up with Dr. Sherlyn Lick for next available sleep consultation.  Return in about 2 years (around 11/17/2022), or if symptoms worsen or fail to improve.    Please do your part to reduce the spread of COVID-19.

## 2020-11-17 NOTE — Progress Notes (Signed)
Synopsis: Referred in October 2020 for amyloidosis by No ref. provider found  Subjective:   PATIENT ID: Gayland Curry III GENDER: male DOB: 05/12/1946, MRN: [id number]  Chief Complaint  Patient presents with  . Follow-up    Pt states he has been doing good since last visit and denies any complaints.    This is a 74 year old gentleman followed by Dr. [name].  Diagnosed in 2012 via eyelid biopsy, amyloidosis.  Found to have elevated serum free lambda light chain and lambda light chain proteinuria.  Bone marrow biopsy with plasmacytosis.  Plasma cell dyscrasia with associated amyloid was treated with Cytoxan, Velcade and Decadron in 2012.  2013 treated with Revlimid plus Decadron.  2014 high-dose melphalan followed by autologous stem cell infusion.  Again in 2014 receiving Velcade plus Decadron.  Continued this treatment for some time.  Patient was started in the Morrison study at [facility] beginning in January 2017.  Then started in the ProFeno study which was discontinued in May 2018.  Social History: [employer] Graduate  OV 09/05/2019: At [facility] hospital had hemoptysis. Was admitted to hospital in [facility] after having episode of hemoptysis.  He had CT imaging of the chest that was completed with no evidence of bleeding.  The patient underwent bronchoscopy which revealed bleeding from the right lower lobe opening and superior segment of the right lower lobe.  Images were taken of this and are stored in our media section.  Since his hospitalization he has not had any additional hemoptysis.  We went over the various reasons for potential hemoptysis today in the office.  He states that his hemoptysis started with episodes of coughing.  At the time had approximately half a tablespoon or so of blood.  That slowly dissipated over time.  No etiology was identified.  CAT scan imaging revealed a 11 mm subpleural nodule in the right upper lobe.  Recommending 58-monthfollow-up.  CT  scan report will be put into our system.  OV 12/05/2019: Patient here today after follow-up CT imaging.  CT imaging completed yesterday with no evidence of pulmonary nodules.  Prior CT imaging with 11 mm subpleural right upper lobe nodule recommend 389-monthollow-up.  This is now been completed.  Discussed results today with patient in the office. Patient has no respiratory symptoms today.  Overall doing well.  He does have questions regarding the Covid vaccine.  We discussed this today in office.  Patient denies any repeat hemoptysis  OV 11/17/2020: Here today for follow-up.  Last seen in the office May, BrWyn QuakerNP.  Had recent follow-up with Dr. ShBenay Spicen oncology on 10/20/2020.  [facility] Dr[facility] TuAmalia Hailey Recent myeloma panel with no significant change in serum M spike or light chains.  Overall has some generalized weakness which was felt to be related to progression of his amyloid myopathy and neuropathy.  No additional hemoptysis at this time.  CT imaging in January 2021 with no nodules or masses. He uses CPAP for his OSA.    Past Medical History:  Diagnosis Date  . AL amyloidosis ([facility]/14/2013  . Arthritis    lumbar DDD  . Blood dyscrasia    plasma cell dyscrasia with associated amyloidosis  . Complication of anesthesia   . DVT (deep venous thrombosis) ([facility] [place]   right  . Dyslipidemia   . H/O multiple myeloma   . Heparin induced thrombocytopenia (HIT) ([facility]/14/2013  . Joint pain   . Peripheral vascular  disease [facility])    DVT- 01/2012, follows by Dr. [name], lovenox maintained since Spring 2013 , pt. aware that last dose is sch. for 07/22/2012  . Pneumonia    hx of with last time in Nov 2012  . Prostate cancer ([place])    amyloidosis, multiple myeloma   . Sleep apnea    severe with AHI 44.4 events per hour - now on CPAP at 11cm H2O  . Spastic quadriparesis (HCC)      Family History  Problem Relation Age of Onset  . Pancreatic cancer Mother    . Anesthesia problems Neg Hx   . Hypotension Neg Hx   . Malignant hyperthermia Neg Hx   . Pseudochol deficiency Neg Hx      Past Surgical History:  Procedure Laterality Date  . ANTERIOR CERVICAL DECOMP/DISCECTOMY FUSION  02/26/2012   Procedure: ANTERIOR CERVICAL DECOMPRESSION/DISCECTOMY FUSION 2 LEVELS;  Surgeon: Hosie Spangle, MD;  Location: [place] NEURO ORS;  Service: Neurosurgery;  Laterality: N/A;  C3-4 C4-5 Anterior cervical decompression/diskectomy, fusion  . BACK SURGERY    . CARDIAC CATHETERIZATION  2010  . CARPAL TUNNEL RELEASE  2009   left  . COLONOSCOPY    . HERNIA REPAIR     around 1985  . ivt     IVC filter placed d/t blood clots, removed- July- 2013  . LIMBAL STEM CELL TRANSPLANT    . LIPOMA EXCISION  around 1985   removed from one of his shoulders  . POSTERIOR CERVICAL FUSION/FORAMINOTOMY  04/06/2012   Procedure: POSTERIOR CERVICAL FUSION/FORAMINOTOMY LEVEL 3;  Surgeon: Hosie Spangle, MD;  Location: [place] NEURO ORS;  Service: Neurosurgery;  Laterality: N/A;  C3 and C4 laminectomy with C23 C34 C45 posterior cervical arthrodesis with instrumentation  . POSTERIOR CERVICAL FUSION/FORAMINOTOMY N/A 06/02/2015   Procedure: Cervical Seven, Thoracic One, Thoracic Two Laminectomies with Cervical Seven-Thoracic One, Thoracic One-Thoracic Two Posterior Cervico-Thoracic  Arthrodesis with Instrumentation;  Surgeon: Jovita Gamma, MD;  Location: [place] NEURO ORS;  Service: Neurosurgery;  Laterality: N/A;  C7, T1, T2 laminectomies with C7-T2 cerevico thoracic arthrodesis with instrumentation  . POSTERIOR LAMINECTOMY / DECOMPRESSION LUMBAR SPINE  around 2007   L4-5  . PROSTATECTOMY  around 2005  . TONSILLECTOMY     as a child    Social History   Socioeconomic History  . Marital status: Married    Spouse name: Not on file  . Number of children: Not on file  . Years of education: Not on file  . Highest education level: Not on file  Occupational History  . Not on file  Tobacco Use   . Smoking status: Former Smoker    Packs/day: 0.50    Years: 10.00    Pack years: 5.00    Types: Cigarettes    Quit date: 07/21/1979    Years since quitting: 41.3  . Smokeless tobacco: Never Used  . Tobacco comment: quit 25+yrs ago  Substance and Sexual Activity  . Alcohol use: Yes    Alcohol/week: 2.0 standard drinks    Types: 2 Cans of beer per week    Comment: occasionally  . Drug use: No  . Sexual activity: Yes  Other Topics Concern  . Not on file  Social History Narrative  . Not on file   Social Determinants of Health   Financial Resource Strain: Not on file  Food Insecurity: Not on file  Transportation Needs: Not on file  Physical Activity: Not on file  Stress: Not on file  Social Connections: Not on  file  Intimate Partner Violence: Not on file     Allergies  Allergen Reactions  . Enoxaparin Other (See Comments)    Blood clots HIT [SRA +]  . Heparin Other (See Comments)    Heparin induced thrombocytopenia (SRA +)     Outpatient Medications Prior to Visit  Medication Sig Dispense Refill  . Cyanocobalamin (VITAMIN B 12 PO) Take 1,000 mg by mouth at bedtime.    . diclofenac (VOLTAREN) 75 MG EC tablet Take 75 mg by mouth 2 (two) times daily.    Marland Kitchen doxycycline (VIBRA-TABS) 100 MG tablet Take 100 mg by mouth 2 (two) times daily.    . furosemide (LASIX) 20 MG tablet Take 40 mg by mouth 3 days. PRN    . spironolactone (ALDACTONE) 25 MG tablet Take 25 mg by mouth daily.    . diphenhydrAMINE (SOMINEX) 25 MG tablet Take 25 mg by mouth as needed. (Patient not taking: Reported on 05/20/2020)    . diphenhydrAMINE HCl (ALLERGY MED PO) Take 1 tablet by mouth daily.     No facility-administered medications prior to visit.    Review of Systems  Constitutional: Negative for chills, fever, malaise/fatigue and weight loss.  HENT: Negative for hearing loss, sore throat and tinnitus.   Eyes: Negative for blurred vision and double vision.  Respiratory: Negative for cough,  hemoptysis, sputum production, shortness of breath, wheezing and stridor.   Cardiovascular: Negative for chest pain, palpitations, orthopnea, leg swelling and PND.  Gastrointestinal: Negative for abdominal pain, constipation, diarrhea, heartburn, nausea and vomiting.  Genitourinary: Negative for dysuria, hematuria and urgency.  Musculoskeletal: Negative for joint pain and myalgias.  Skin: Negative for itching and rash.  Neurological: Negative for dizziness, tingling, weakness and headaches.  Endo/Heme/Allergies: Negative for environmental allergies. Does not bruise/bleed easily.  Psychiatric/Behavioral: Negative for depression. The patient is not nervous/anxious and does not have insomnia.   All other systems reviewed and are negative.    Objective:  Physical Exam Vitals reviewed.  Constitutional:      General: He is not in acute distress.    Appearance: He is well-developed and well-nourished.  HENT:     Head: Normocephalic and atraumatic.     Mouth/Throat:     Mouth: Oropharynx is clear and moist.  Eyes:     General: No scleral icterus.    Conjunctiva/sclera: Conjunctivae normal.     Pupils: Pupils are equal, round, and reactive to light.  Neck:     Vascular: No JVD.     Trachea: No tracheal deviation.  Cardiovascular:     Rate and Rhythm: Normal rate and regular rhythm.     Pulses: Intact distal pulses.     Heart sounds: Normal heart sounds. No murmur heard.   Pulmonary:     Effort: Pulmonary effort is normal. No tachypnea, accessory muscle usage or respiratory distress.     Breath sounds: Normal breath sounds. No stridor. No wheezing, rhonchi or rales.  Abdominal:     General: Bowel sounds are normal. There is no distension.     Palpations: Abdomen is soft.     Tenderness: There is no abdominal tenderness.  Musculoskeletal:        General: No tenderness or edema.     Cervical back: Neck supple.  Lymphadenopathy:     Cervical: No cervical adenopathy.  Skin:     General: Skin is warm and dry.     Capillary Refill: Capillary refill takes less than 2 seconds.     Findings: No rash.  Neurological:     Mental Status: He is alert and oriented to person, place, and time.  Psychiatric:        Mood and Affect: Mood and affect normal.        Behavior: Behavior normal.      Vitals:   11/17/20 1123  BP: 118/62  Pulse: 64  SpO2: 98%  Weight: 208 lb (94.3 kg)  Height: 6' (1.829 m)   98% on RA BMI Readings from Last 3 Encounters:  11/17/20 28.21 kg/m  10/20/20 27.60 kg/m  05/20/20 27.12 kg/m   Wt Readings from Last 3 Encounters:  11/17/20 208 lb (94.3 kg)  10/20/20 203 lb 8 oz (92.3 kg)  05/20/20 200 lb (90.7 kg)     CBC    Component Value Date/Time   WBC 9.2 03/17/2020 1605   RBC 4.34 03/17/2020 1605   HGB 14.1 03/17/2020 1605   HGB 13.3 04/24/2019 0906   HGB 13.1 08/05/2016 0917   HCT 41.5 03/17/2020 1605   HCT 39.1 08/05/2016 0917   PLT 189.0 03/17/2020 1605   PLT 167 04/24/2019 0906   PLT 155 08/05/2016 0917   MCV 95.5 03/17/2020 1605   MCV 93.3 08/05/2016 0917   MCH 32.0 04/24/2019 0906   MCHC 33.9 03/17/2020 1605   RDW 14.1 03/17/2020 1605   RDW 13.9 08/05/2016 0917   LYMPHSABS 2.4 03/17/2020 1605   LYMPHSABS 1.9 08/05/2016 0917   MONOABS 0.9 03/17/2020 1605   MONOABS 0.8 08/05/2016 0917   EOSABS 0.1 03/17/2020 1605   EOSABS 0.1 08/05/2016 0917   BASOSABS 0.1 03/17/2020 1605   BASOSABS 0.0 08/05/2016 0917     Chest Imaging: CT chest January 2018 completed at [facility] visible in care everywhere. Multiple stable bilateral calcified and noncalcified pulmonary nodules.  Largest at 0.8 cm.  Able to review the report however not able to review the images. The patient's images have been independently reviewed by me.    CT chest January 2021: Images reviewed no evidence of lung nodule, resolution.  Basilar atelectasis, no acute process. The patient's images have been independently reviewed by me.    Pulmonary Functions  Testing Results: No flowsheet data found.  FeNO: None   Pathology: None   Echocardiogram:   Heart Catheterization:  ECHO 08/28/2019 @ [facility] Summary  1. The left ventricle is normal in size with upper normal wall thickness.  2. Normal left ventricular systolic function, ejection fraction 55-60%.  3. Diastolic dysfunction - grade II (elevated filling pressures).  4. Normal right ventricular size and systolic function.  5. Overall stable to previous echo.    September 2020: [place]:     ICD-10-CM   1. AL amyloidosis ([place])  E85.81   2. Multiple myeloma in remission (HCC)  C90.01   3. History of pulmonary embolism  Z86.711   4. Hemoptysis  R04.2   5. OSA on CPAP  G47.33    Z99.89     Discussion: 74 year old gentleman, complex medical history, please refer to Dr. Gearldine Shown office oncology note.  History of amyloidosis and multiple myeloma.  Followed by oncology at [facility] and here by Dr. [name] at [facility] center.  Initially saw me for an evaluation of hemoptysis.  He is no longer had hemoptysis and this is stabilized.  We were also following him for a pulmonary nodule which has documented stability.  No additional follow-up needed.  Plan: We will have him establish care with one of our sleep physicians  for management of his OSA/CPAP needs. His DME supply company is requesting evaluation. Patient can follow-up with [place] in 2 years or as needed. Patient was counseled on contacting us if hemoptysis returns or any changes in his respiratory status.    Current Outpatient Medications:  .  Cyanocobalamin (VITAMIN B 12 PO), Take 1,000 mg by mouth at bedtime., Disp: , Rfl:  .  diclofenac (VOLTAREN) 75 MG EC tablet, Take 75 mg by mouth 2 (two) times daily., Disp: , Rfl:  .  doxycycline (VIBRA-TABS) 100 MG tablet, Take 100 mg by mouth 2 (two) times daily., Disp: , Rfl:  .  furosemide (LASIX) 20 MG tablet, Take 40 mg by mouth 3 days. PRN,  Disp: , Rfl:  .  spironolactone (ALDACTONE) 25 MG tablet, Take 25 mg by mouth daily., Disp: , Rfl:    Garner Nash, DO [facility] 11/17/2020 11:44 AM

## 2020-11-25 NOTE — Progress Notes (Signed)
11/26/20- 75 yoM former smoker for sleep evaluation with hx OSA, needing CPAP support. NPSG 06/29/03  AHI 23, desat to 83% NPSG 11/27/14- AHI 44.4/ hr, desaturation to 82%, body weight 194 lbs, CPAP to 11. Medical problem list includes r DVT, Mesenteric Thrombosis, PVD, PE, Spastic Quadriparesis, Prostate Cancer, Heparin Induced Thrombocytopenia, Amyloidosis( Dr [name], Dr Valeta Harms) , Multiple Myeloma, Epworth score- 1 CPAP 11/ [employer] Download- compliance 97%, AHI 0.7/ hr Body weight today- 205 lbs Covid vax-3 Phizer Flu vax- had -----Patient is doing good, machine working good, sleeping good. [employer] told him that he needed an appointment He needed to update clinical contact to continue support from DME. Current machine is 76-64 years old and functioning well on fixed pressure 11 with nasal pillows mask.  Reports other medical issues are stable at this visit, with no recent hemoptysis.  He does follow with cardiology.   Prior to Admission medications   Medication Sig Start Date End Date Taking? Authorizing Provider  Cyanocobalamin (VITAMIN B 12 PO) Take 1,000 mg by mouth at bedtime.   Yes [provider]  diclofenac (VOLTAREN) 75 MG EC tablet Take 75 mg by mouth 2 (two) times daily. 09/12/20  Yes [provider]  doxycycline (VIBRA-TABS) 100 MG tablet Take 100 mg by mouth 2 (two) times daily. 03/11/20  Yes [provider]  furosemide (LASIX) 20 MG tablet Take 40 mg by mouth 3 days. PRN 06/17/17  Yes [provider]  spironolactone (ALDACTONE) 25 MG tablet Take 25 mg by mouth daily. 05/28/19  Yes [provider]   Past Medical History:  Diagnosis Date  . AL amyloidosis ([place]) 11/04/2012  . Arthritis    lumbar DDD  . Blood dyscrasia    plasma cell dyscrasia with associated amyloidosis  . Complication of anesthesia   . DVT (deep venous thrombosis) ([place])    right  . Dyslipidemia   . H/O multiple myeloma   . Heparin induced thrombocytopenia (HIT) ([place])  11/04/2012  . Joint pain   . Peripheral vascular disease ([place])    DVT- 01/2012, follows by Dr. [name], lovenox maintained since Spring 2013 , pt. aware that last dose is sch. for 07/22/2012  . Pneumonia    hx of with last time in Nov 2012  . Prostate cancer ([place])    amyloidosis, multiple myeloma   . Sleep apnea    severe with AHI 44.4 events per hour - now on CPAP at 11cm H2O  . Spastic quadriparesis [facility])    Past Surgical History:  Procedure Laterality Date  . ANTERIOR CERVICAL DECOMP/DISCECTOMY FUSION  02/26/2012   Procedure: ANTERIOR CERVICAL DECOMPRESSION/DISCECTOMY FUSION 2 LEVELS;  Surgeon: Hosie Spangle, MD;  Location: [place] NEURO ORS;  Service: Neurosurgery;  Laterality: N/A;  C3-4 C4-5 Anterior cervical decompression/diskectomy, fusion  . BACK SURGERY    . CARDIAC CATHETERIZATION  2010  . CARPAL TUNNEL RELEASE  2009   left  . COLONOSCOPY    . HERNIA REPAIR     around 1985  . ivt     IVC filter placed d/t blood clots, removed- July- 2013  . LIMBAL STEM CELL TRANSPLANT    . LIPOMA EXCISION  around 1985   removed from one of his shoulders  . POSTERIOR CERVICAL FUSION/FORAMINOTOMY  04/06/2012   Procedure: POSTERIOR CERVICAL FUSION/FORAMINOTOMY LEVEL 3;  Surgeon: Hosie Spangle, MD;  Location: [place] NEURO ORS;  Service: Neurosurgery;  Laterality: N/A;  C3 and C4 laminectomy with C23 C34 C45 posterior cervical arthrodesis with instrumentation  .  POSTERIOR CERVICAL FUSION/FORAMINOTOMY N/A 06/02/2015   Procedure: Cervical Seven, Thoracic One, Thoracic Two Laminectomies with Cervical Seven-Thoracic One, Thoracic One-Thoracic Two Posterior Cervico-Thoracic  Arthrodesis with Instrumentation;  Surgeon: Jovita Gamma, MD;  Location: [place] NEURO ORS;  Service: Neurosurgery;  Laterality: N/A;  C7, T1, T2 laminectomies with C7-T2 cerevico thoracic arthrodesis with instrumentation  . POSTERIOR LAMINECTOMY / DECOMPRESSION LUMBAR SPINE  around 2007   L4-5  . PROSTATECTOMY  around 2005  .  TONSILLECTOMY     as a child   Family History  Problem Relation Age of Onset  . Pancreatic cancer Mother   . Anesthesia problems Neg Hx   . Hypotension Neg Hx   . Malignant hyperthermia Neg Hx   . Pseudochol deficiency Neg Hx    Social History   Socioeconomic History  . Marital status: Married    Spouse name: Not on file  . Number of children: Not on file  . Years of education: Not on file  . Highest education level: Not on file  Occupational History  . Not on file  Tobacco Use  . Smoking status: Former Smoker    Packs/day: 0.50    Years: 10.00    Pack years: 5.00    Types: Cigarettes    Quit date: 07/21/1979    Years since quitting: 41.3  . Smokeless tobacco: Never Used  . Tobacco comment: quit 25+yrs ago  Vaping Use  . Vaping Use: Never used  Substance and Sexual Activity  . Alcohol use: Yes    Alcohol/week: 2.0 standard drinks    Types: 2 Cans of beer per week    Comment: occasionally  . Drug use: No  . Sexual activity: Yes  Other Topics Concern  . Not on file  Social History Narrative  . Not on file   Social Determinants of Health   Financial Resource Strain: Not on file  Food Insecurity: Not on file  Transportation Needs: Not on file  Physical Activity: Not on file  Stress: Not on file  Social Connections: Not on file  Intimate Partner Violence: Not on file   ROS-see HPI   + = positive Constitutional:    weight loss, night sweats, fevers, chills, fatigue, lassitude. HEENT:    headaches, difficulty swallowing, tooth/dental problems, sore throat,       sneezing, itching, ear ache, nasal congestion, post nasal drip, snoring CV:    chest pain, orthopnea, PND, swelling in lower extremities, anasarca,                                  dizziness, palpitations Resp:   shortness of breath with exertion or at rest.                productive cough,   non-productive cough, coughing up of blood.              change in color of mucus.  wheezing.   Skin:    rash or  lesions. GI:  No-   heartburn, indigestion, abdominal pain, nausea, vomiting, diarrhea,                 change in bowel habits, loss of appetite GU: dysuria, change in color of urine, no urgency or frequency.   flank pain. MS:   joint pain, stiffness, decreased range of motion, back pain. Neuro-     nothing unusual Psych:  change in mood or affect.  depression or anxiety.  memory loss.  OBJ- Physical Exam General- Alert, Oriented, Affect-appropriate, Distress- none acute Skin- rash-none, lesions- none, excoriation- none Lymphadenopathy- none Head- atraumatic            Eyes- Gross vision intact, PERRLA, conjunctivae and secretions clear            Ears- Hearing, canals-normal            Nose- Clear, no-Septal dev, mucus, polyps, erosion, perforation             Throat- Mallampati III-IV , mucosa clear , drainage- none, tonsils- atrophic Neck- flexible , trachea midline, no stridor , thyroid nl, carotid no bruit Chest - symmetrical excursion , unlabored           Heart/CV- RRR , no murmur , no gallop  , no rub, nl s1 s2                           - JVD- none , edema- none, stasis changes- none, varices- none           Lung- clear to P&A, wheeze- none, cough- none , dullness-none, rub- none           Chest wall-  Abd-  Br/ Gen/ Rectal- Not done, not indicated Extrem- cyanosis- none, clubbing, none, atrophy- none, strength- nl Neuro- grossly intact to observation, sitting in chair

## 2020-11-26 ENCOUNTER — Encounter: Payer: Self-pay | Admitting: Internal Medicine

## 2020-11-26 ENCOUNTER — Other Ambulatory Visit: Payer: Self-pay

## 2020-11-26 ENCOUNTER — Ambulatory Visit (INDEPENDENT_AMBULATORY_CARE_PROVIDER_SITE_OTHER): Payer: Medicare Other | Admitting: Internal Medicine

## 2020-11-26 DIAGNOSIS — E8581 Light chain (AL) amyloidosis: Secondary | ICD-10-CM | POA: Diagnosis not present

## 2020-11-26 DIAGNOSIS — G4733 Obstructive sleep apnea (adult) (pediatric): Secondary | ICD-10-CM | POA: Diagnosis not present

## 2020-11-26 DIAGNOSIS — R042 Hemoptysis: Secondary | ICD-10-CM | POA: Diagnosis not present

## 2020-11-26 NOTE — Assessment & Plan Note (Signed)
He continues to follow with oncology.

## 2020-11-26 NOTE — Assessment & Plan Note (Signed)
Denies recurrence ssince last visit with Dr Tonia Brooms.

## 2020-11-26 NOTE — Assessment & Plan Note (Signed)
Benefits from CPAP with good compliance and control. We reviewed treatment options and comfort measures. Plan continue CPAP 11. Probably at next machine replacement change to auto 5-15

## 2020-11-26 NOTE — Patient Instructions (Signed)
We can continue CPAP 10, mask of choice, humidifier, supplies, AirView/ card  Please cal if we can help

## 2020-12-30 DIAGNOSIS — L820 Inflamed seborrheic keratosis: Secondary | ICD-10-CM | POA: Diagnosis not present

## 2020-12-30 DIAGNOSIS — Z85828 Personal history of other malignant neoplasm of skin: Secondary | ICD-10-CM | POA: Diagnosis not present

## 2020-12-30 DIAGNOSIS — D225 Melanocytic nevi of trunk: Secondary | ICD-10-CM | POA: Diagnosis not present

## 2020-12-30 DIAGNOSIS — L570 Actinic keratosis: Secondary | ICD-10-CM | POA: Diagnosis not present

## 2020-12-30 DIAGNOSIS — L821 Other seborrheic keratosis: Secondary | ICD-10-CM | POA: Diagnosis not present

## 2020-12-30 DIAGNOSIS — L853 Xerosis cutis: Secondary | ICD-10-CM | POA: Diagnosis not present

## 2020-12-30 DIAGNOSIS — L814 Other melanin hyperpigmentation: Secondary | ICD-10-CM | POA: Diagnosis not present

## 2021-01-20 DIAGNOSIS — H2513 Age-related nuclear cataract, bilateral: Secondary | ICD-10-CM | POA: Diagnosis not present

## 2021-01-20 DIAGNOSIS — H5203 Hypermetropia, bilateral: Secondary | ICD-10-CM | POA: Diagnosis not present

## 2021-02-04 DIAGNOSIS — I5030 Unspecified diastolic (congestive) heart failure: Secondary | ICD-10-CM | POA: Diagnosis not present

## 2021-02-04 DIAGNOSIS — G63 Polyneuropathy in diseases classified elsewhere: Secondary | ICD-10-CM | POA: Diagnosis not present

## 2021-02-04 DIAGNOSIS — Z6828 Body mass index (BMI) 28.0-28.9, adult: Secondary | ICD-10-CM | POA: Diagnosis not present

## 2021-02-04 DIAGNOSIS — R5383 Other fatigue: Secondary | ICD-10-CM | POA: Diagnosis not present

## 2021-02-04 DIAGNOSIS — I5032 Chronic diastolic (congestive) heart failure: Secondary | ICD-10-CM | POA: Diagnosis not present

## 2021-02-04 DIAGNOSIS — G629 Polyneuropathy, unspecified: Secondary | ICD-10-CM | POA: Diagnosis not present

## 2021-02-04 DIAGNOSIS — E8581 Light chain (AL) amyloidosis: Secondary | ICD-10-CM | POA: Diagnosis not present

## 2021-02-16 DIAGNOSIS — E859 Amyloidosis, unspecified: Secondary | ICD-10-CM | POA: Diagnosis not present

## 2021-02-16 DIAGNOSIS — E854 Organ-limited amyloidosis: Secondary | ICD-10-CM | POA: Diagnosis not present

## 2021-02-16 DIAGNOSIS — I43 Cardiomyopathy in diseases classified elsewhere: Secondary | ICD-10-CM | POA: Diagnosis not present

## 2021-04-01 DIAGNOSIS — I700 Atherosclerosis of aorta: Secondary | ICD-10-CM | POA: Diagnosis not present

## 2021-04-01 DIAGNOSIS — E859 Amyloidosis, unspecified: Secondary | ICD-10-CM | POA: Diagnosis not present

## 2021-04-01 DIAGNOSIS — M5136 Other intervertebral disc degeneration, lumbar region: Secondary | ICD-10-CM | POA: Diagnosis not present

## 2021-04-01 DIAGNOSIS — I5032 Chronic diastolic (congestive) heart failure: Secondary | ICD-10-CM | POA: Diagnosis not present

## 2021-04-01 DIAGNOSIS — Z8546 Personal history of malignant neoplasm of prostate: Secondary | ICD-10-CM | POA: Diagnosis not present

## 2021-04-01 DIAGNOSIS — Z1331 Encounter for screening for depression: Secondary | ICD-10-CM | POA: Diagnosis not present

## 2021-04-01 DIAGNOSIS — G629 Polyneuropathy, unspecified: Secondary | ICD-10-CM | POA: Diagnosis not present

## 2021-04-03 DIAGNOSIS — Z125 Encounter for screening for malignant neoplasm of prostate: Secondary | ICD-10-CM | POA: Diagnosis not present

## 2021-04-03 DIAGNOSIS — G629 Polyneuropathy, unspecified: Secondary | ICD-10-CM | POA: Diagnosis not present

## 2021-04-03 DIAGNOSIS — Z0000 Encounter for general adult medical examination without abnormal findings: Secondary | ICD-10-CM | POA: Diagnosis not present

## 2021-04-07 DIAGNOSIS — Z23 Encounter for immunization: Secondary | ICD-10-CM | POA: Diagnosis not present

## 2021-04-21 ENCOUNTER — Other Ambulatory Visit: Payer: Self-pay

## 2021-04-21 ENCOUNTER — Inpatient Hospital Stay: Payer: Medicare Other | Attending: Nurse Practitioner | Admitting: Nurse Practitioner

## 2021-04-21 ENCOUNTER — Encounter: Payer: Self-pay | Admitting: Nurse Practitioner

## 2021-04-21 VITALS — BP 118/71 | HR 64 | Temp 98.1°F | Resp 18

## 2021-04-21 DIAGNOSIS — G4730 Sleep apnea, unspecified: Secondary | ICD-10-CM | POA: Diagnosis not present

## 2021-04-21 DIAGNOSIS — Z8701 Personal history of pneumonia (recurrent): Secondary | ICD-10-CM | POA: Insufficient documentation

## 2021-04-21 DIAGNOSIS — M549 Dorsalgia, unspecified: Secondary | ICD-10-CM | POA: Insufficient documentation

## 2021-04-21 DIAGNOSIS — E8581 Light chain (AL) amyloidosis: Secondary | ICD-10-CM | POA: Diagnosis not present

## 2021-04-21 DIAGNOSIS — Z9484 Stem cells transplant status: Secondary | ICD-10-CM | POA: Insufficient documentation

## 2021-04-21 DIAGNOSIS — Z8042 Family history of malignant neoplasm of prostate: Secondary | ICD-10-CM | POA: Insufficient documentation

## 2021-04-21 DIAGNOSIS — E785 Hyperlipidemia, unspecified: Secondary | ICD-10-CM | POA: Insufficient documentation

## 2021-04-21 DIAGNOSIS — G629 Polyneuropathy, unspecified: Secondary | ICD-10-CM | POA: Diagnosis not present

## 2021-04-21 DIAGNOSIS — Z86718 Personal history of other venous thrombosis and embolism: Secondary | ICD-10-CM | POA: Insufficient documentation

## 2021-04-21 DIAGNOSIS — E042 Nontoxic multinodular goiter: Secondary | ICD-10-CM | POA: Diagnosis not present

## 2021-04-21 NOTE — Progress Notes (Addendum)
[place] OFFICE PROGRESS NOTE   Diagnosis: Amyloidosis  INTERVAL HISTORY:   [name] returns as scheduled.  More fatigued and some increase in neuropathy in the fingers and toes.  He has a good appetite.  He continues to have back pain, mainly with increased activity.  He has intermittent swelling at the right ankle.  The swelling improves overnight.  He wears support stockings.  Objective:  Vital signs in last 24 hours:  Blood pressure 118/71, pulse 64, temperature 98.1 F (36.7 C), temperature source Oral, resp. rate 18, SpO2 100 %.    HEENT: No thrush or ulcers. Resp: Lungs clear bilaterally. Cardio: Regular rate and rhythm. GI: Abdomen soft and nontender.  No hepatosplenomegaly. Vascular: Trace edema at the right ankle.  Calves are soft and nontender.   Lab Results:  Lab Results  Component Value Date   WBC 9.2 03/17/2020   HGB 14.1 03/17/2020   HCT 41.5 03/17/2020   MCV 95.5 03/17/2020   PLT 189.0 03/17/2020   NEUTROABS 5.6 03/17/2020    Imaging:  No results found.  Medications: I have reviewed the patient's current medications.  Assessment/Plan: 1. 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 2. "Bruising" at the eyelids and mouth: Likely related to amyloidosis, persistent. 3. Numbness and loss of vibratory sense at the fingertips: This predated Velcade-based therapy, but worsened. Now much improved following cervical spine surgery. 4. Elevated serum free lambda light chains. The lambda light chains were lower on November 16 and slightly higher on 12/14/2011. 5. Lambda light chain proteinuria. 6. Bone marrow plasmacytosis: Variable increase in plasma cells noted on the bone marrow biopsy 07/29/2011 with plasma cells estimated to represent between 4% and 20% of the cellular population. 7. Remote history of prostate cancer. 8. Sleep apnea. 9. Dyslipidemia. 10. Report of pneumonia on 2 occasions in 2011. 11. Admission to a hospital in  [place], [place] October 2012 with "pneumonia." A chest x-ray at [facility] on November 2 was negative .  12. Low serum immunoglobulin G level. 13. Plasma cell dyscrasia with associated amyloidosis.  1. Initiation of systemic therapy with Cytoxan, Velcade, and Decadron 08/12/2011. Cycle #2 was initiated on 09/16/2011. Cycle #3 was initiated on 10/15/2011. Velcade was placed on hold due to neuropathy. 2. The serum free lambda light chains were decreased on October 08 2011. 3. The serum free lambda light chains were slightly increased on 12/14/2011, lower on 12/29/2011 and 02/02/2012. 4. Initiation of Revlimid/Decadron February 2013, cycle 2 started on 01/28/2012. 5. High-dose melphalan chemotherapy followed by autologous stem cell infusion on 02/09/2013 6. Initiation of Velcade/Decadrontherapy 05/28/2013, he completed 2 cycles on a day 1, 4, 8, 11 schedule 7. Improvement in the serum free lambda light chains following induction Velcade/Decadron  8. Initiation of weekly Velcade/Decadron on 07/16/2013, last treatment on 10/16/2013 9. Improvement in the serum free lambda light chains on 08/21/2013 and 09/18/2013 ([facility]) 10. Normal serum free lambda light chains on 12/13/2013 11. Mild increase of the serum free lambda light chains beginning January 2016-stable 12. treatment on the Pronto study at [place] beginning 12/17/2015, in of study 11/23/2016  Started on phase 2b Prothenastudy-long-term safety/efficacy study 01/11/2017,discontinued May 2018  Doxycycline started 05/17/2017 15. Loss of "balance "and proximal motor weakness secondary to cervical stenosis-status post decompression surgery on 02/19/2012. Improved, but not resolved stenosis with mass effect on the spinal cord noted on a repeat MRI 03/28/2012. He underwent further decompression surgery on 04/06/2012. The ataxia, weakness, and peripheral numbness is much improved.  16. Right lower  Extremity deep vein thrombosis 02/02/2012-a  Doppler ultrasound confirmed a gastrocnemius and peroneal deep vein thrombosis. An IVC filter was placed prior to surgery and he was maintained on Lovenox. The IVC filter was removed on 05/04/2012.  17. Anorexia following cervical spine surgery. Improved.  18. Episode of flank pain and hematuria in 03/31/2012-etiology unclear. He completed a course of antibiotics. The hematuria and pain have resolved.  19. Rectal wall thickening noted on a CT of the pelvis 03/31/2012.  20. colonoscopy showed an area of abnormality at the rectum with biopsy positive for amyloid.  21. Lung nodule noted on the CT 03/31/2012 , no lung nodule on a chest CT 07/30/2013  22. Lumbar stenosis-status post surgery on 07/31/2012.  23. Status post stem cell collection.  24.Rright internal jugular thrombosis, 10/22/2012, likely related to pheresis catheter and HIT  25. Admission with acute right pulmonary embolism 10/27/2012  26. Superior mesenteric vein/portal vein thrombosis diagnosed on a CT of the abdomen 10/28/2012 and 10/29/2012-treated with Argatroban , maintained on Coumadin anticoagulation until he hospital admission March 2014 for stem cell therapy  27. HIT confirmed on a serotonin release assay 10/29/2012  28. History of Renal insufficiency during the December 2013 hospital admission-likely related to dehydration and? Contrast nephropathy , persistent mild elevation of the creatinine  29. Dysphagia-? Secondary to amyloidosis, reported when he was here on 05/08/2013. He did not complain of dysphagia today  30. Anemia secondary to multiple myeloma and chemotherapy , improved  31.right lower Extremity deep vein thrombosis diagnosed at [facility] July 2014 , repeat Doppler at [facility] 07/19/2013 with chronic thrombus in the right gastrocnemius, Eliquis has been discontinued  32. Fall August 2014 with a left shoulder injury, diagnosed with a rotator cuff tear, status post surgical repair 09/19/2013  33. Peripheral  neuropathy-progressive, likely secondary to Velcade. The neuropathy symptoms have partially improved since Velcade was discontinued after treatment 10/16/2013  34. Cardiac MRI 05/22/2015-normal left ventricular size with severe concentric hypertrophy and mildly impaired systolic function, HFSF-42%, right ventricular hypertrophy, diffuse late gadolinium enhancement in the left and right ventricles-findings consistent with cardiac amyloidosis. Echocardiogram at [facility]-LVEF 50%  Echocardiogram at [facility] 04/20/2018 - left ventricular hypertrophy, LVEF 55-60%  Echocardiogram at [facility] 08/28/2019-left ventricular ejection fraction 55-60%, left ventricle normal in size with upper normal wall thickness 35. Multinodular goiter confirmed on thyroid ultrasound at [facility] 06/29/2016, amyloidosis of the thyroid confirmed at [facility] 04/20/2018    Disposition: Mr. Vonbargen appears unchanged.  He has increased fatigue and worsening neuropathy symptoms which may be related to progression of amyloid myopathy/neuropathy.  He is not interested in beginning treatment at this time.  He will follow-up with Dr. Amalia Hailey as scheduled in 3 months.  He will return for follow-up here in 6 months.  We are available to see him sooner if needed.  Patient seen with Dr. [name].    Ned Card ANP/GNP-BC   04/21/2021  10:03 AM This was a shared visit with Ned Card.  Mr. Afzal appears stable.  The plan is to continue observation for the amyloidosis.  He continues laboratory follow-up with Dr. Amalia Hailey.  We will initiate salvage systemic therapy if he develops clear clinical or laboratory evidence of progressive myeloma/amyloidosis.  I was present for greater than 50% of today's visit.  I performed medical decision making.  Julieanne Manson, MD

## 2021-05-08 DIAGNOSIS — I5189 Other ill-defined heart diseases: Secondary | ICD-10-CM | POA: Diagnosis not present

## 2021-05-08 DIAGNOSIS — Z6827 Body mass index (BMI) 27.0-27.9, adult: Secondary | ICD-10-CM | POA: Diagnosis not present

## 2021-06-01 DIAGNOSIS — I5189 Other ill-defined heart diseases: Secondary | ICD-10-CM | POA: Diagnosis not present

## 2021-06-01 DIAGNOSIS — R0600 Dyspnea, unspecified: Secondary | ICD-10-CM | POA: Diagnosis not present

## 2021-06-01 DIAGNOSIS — E785 Hyperlipidemia, unspecified: Secondary | ICD-10-CM | POA: Diagnosis not present

## 2021-06-01 DIAGNOSIS — I2584 Coronary atherosclerosis due to calcified coronary lesion: Secondary | ICD-10-CM | POA: Diagnosis not present

## 2021-06-01 DIAGNOSIS — R5383 Other fatigue: Secondary | ICD-10-CM | POA: Diagnosis not present

## 2021-06-01 DIAGNOSIS — I2510 Atherosclerotic heart disease of native coronary artery without angina pectoris: Secondary | ICD-10-CM | POA: Diagnosis not present

## 2021-06-25 DIAGNOSIS — I739 Peripheral vascular disease, unspecified: Secondary | ICD-10-CM | POA: Diagnosis not present

## 2021-06-25 DIAGNOSIS — M5416 Radiculopathy, lumbar region: Secondary | ICD-10-CM | POA: Diagnosis not present

## 2021-06-25 DIAGNOSIS — G629 Polyneuropathy, unspecified: Secondary | ICD-10-CM | POA: Diagnosis not present

## 2021-06-25 DIAGNOSIS — Z6827 Body mass index (BMI) 27.0-27.9, adult: Secondary | ICD-10-CM | POA: Diagnosis not present

## 2021-06-29 DIAGNOSIS — C44319 Basal cell carcinoma of skin of other parts of face: Secondary | ICD-10-CM | POA: Diagnosis not present

## 2021-06-29 DIAGNOSIS — D485 Neoplasm of uncertain behavior of skin: Secondary | ICD-10-CM | POA: Diagnosis not present

## 2021-06-29 DIAGNOSIS — L821 Other seborrheic keratosis: Secondary | ICD-10-CM | POA: Diagnosis not present

## 2021-06-29 DIAGNOSIS — L570 Actinic keratosis: Secondary | ICD-10-CM | POA: Diagnosis not present

## 2021-06-29 DIAGNOSIS — Z85828 Personal history of other malignant neoplasm of skin: Secondary | ICD-10-CM | POA: Diagnosis not present

## 2021-06-29 DIAGNOSIS — D692 Other nonthrombocytopenic purpura: Secondary | ICD-10-CM | POA: Diagnosis not present

## 2021-06-30 ENCOUNTER — Other Ambulatory Visit (HOSPITAL_COMMUNITY): Payer: Self-pay | Admitting: Neurological Surgery

## 2021-06-30 DIAGNOSIS — I739 Peripheral vascular disease, unspecified: Secondary | ICD-10-CM

## 2021-07-03 ENCOUNTER — Ambulatory Visit (HOSPITAL_COMMUNITY)
Admission: RE | Admit: 2021-07-03 | Discharge: 2021-07-03 | Disposition: A | Discharge status: Home / Self Care | Payer: Medicare Other | Source: Ambulatory Visit | Attending: Internal Medicine | Admitting: Internal Medicine

## 2021-07-03 ENCOUNTER — Other Ambulatory Visit: Payer: Self-pay

## 2021-07-03 DIAGNOSIS — I739 Peripheral vascular disease, unspecified: Secondary | ICD-10-CM | POA: Diagnosis not present

## 2021-07-22 DIAGNOSIS — R0600 Dyspnea, unspecified: Secondary | ICD-10-CM | POA: Diagnosis not present

## 2021-07-22 DIAGNOSIS — Z6827 Body mass index (BMI) 27.0-27.9, adult: Secondary | ICD-10-CM | POA: Diagnosis not present

## 2021-07-22 DIAGNOSIS — G629 Polyneuropathy, unspecified: Secondary | ICD-10-CM | POA: Diagnosis not present

## 2021-07-22 DIAGNOSIS — I5032 Chronic diastolic (congestive) heart failure: Secondary | ICD-10-CM | POA: Diagnosis not present

## 2021-07-22 DIAGNOSIS — E8581 Light chain (AL) amyloidosis: Secondary | ICD-10-CM | POA: Diagnosis not present

## 2021-07-22 DIAGNOSIS — R5383 Other fatigue: Secondary | ICD-10-CM | POA: Diagnosis not present

## 2021-07-22 DIAGNOSIS — I5030 Unspecified diastolic (congestive) heart failure: Secondary | ICD-10-CM | POA: Diagnosis not present

## 2021-07-22 DIAGNOSIS — Z9481 Bone marrow transplant status: Secondary | ICD-10-CM | POA: Diagnosis not present

## 2021-07-22 DIAGNOSIS — Z79899 Other long term (current) drug therapy: Secondary | ICD-10-CM | POA: Diagnosis not present

## 2021-07-22 DIAGNOSIS — I2699 Other pulmonary embolism without acute cor pulmonale: Secondary | ICD-10-CM | POA: Diagnosis not present

## 2021-07-29 DIAGNOSIS — Z9481 Bone marrow transplant status: Secondary | ICD-10-CM | POA: Diagnosis not present

## 2021-07-29 DIAGNOSIS — E8581 Light chain (AL) amyloidosis: Secondary | ICD-10-CM | POA: Diagnosis not present

## 2021-07-29 DIAGNOSIS — Z23 Encounter for immunization: Secondary | ICD-10-CM | POA: Diagnosis not present

## 2021-08-11 DIAGNOSIS — Z23 Encounter for immunization: Secondary | ICD-10-CM | POA: Diagnosis not present

## 2021-08-11 DIAGNOSIS — E859 Amyloidosis, unspecified: Secondary | ICD-10-CM | POA: Diagnosis not present

## 2021-08-11 DIAGNOSIS — I5032 Chronic diastolic (congestive) heart failure: Secondary | ICD-10-CM | POA: Diagnosis not present

## 2021-08-11 DIAGNOSIS — M5136 Other intervertebral disc degeneration, lumbar region: Secondary | ICD-10-CM | POA: Diagnosis not present

## 2021-08-11 DIAGNOSIS — I700 Atherosclerosis of aorta: Secondary | ICD-10-CM | POA: Diagnosis not present

## 2021-08-12 ENCOUNTER — Telehealth: Payer: Self-pay | Admitting: *Deleted

## 2021-08-12 NOTE — Telephone Encounter (Signed)
Called patient to inquire about having Evusheld injection per suggestion of Dr. Amalia Hailey at [facility]. Patient reports he has already had his first one in [place].

## 2021-10-27 ENCOUNTER — Inpatient Hospital Stay: Payer: Medicare Other | Attending: Oncology | Admitting: Oncology

## 2021-10-27 ENCOUNTER — Other Ambulatory Visit: Payer: Self-pay

## 2021-10-27 ENCOUNTER — Telehealth: Payer: Self-pay | Admitting: Oncology

## 2021-10-27 VITALS — BP 125/63 | HR 70 | Temp 98.7°F | Resp 20 | Ht 73.0 in | Wt 204.4 lb

## 2021-10-27 DIAGNOSIS — R5381 Other malaise: Secondary | ICD-10-CM | POA: Diagnosis not present

## 2021-10-27 DIAGNOSIS — E859 Amyloidosis, unspecified: Secondary | ICD-10-CM | POA: Insufficient documentation

## 2021-10-27 DIAGNOSIS — E8581 Light chain (AL) amyloidosis: Secondary | ICD-10-CM

## 2021-10-27 DIAGNOSIS — R200 Anesthesia of skin: Secondary | ICD-10-CM | POA: Insufficient documentation

## 2021-10-27 NOTE — Progress Notes (Signed)
[place] OFFICE PROGRESS NOTE   Diagnosis: Amyloidosis  INTERVAL HISTORY:   Brent Allison returns for a scheduled visit.  He continues to have malaise.  He reports progressive peripheral numbness.  He fell on 10/17/2021 and struck the right forehead.  He had a laceration near the right eye.  This is healing. He complains of increased pain in the mid upper back.  He is scheduled to see neurosurgery later this week.  He saw Dr. Amalia Hailey in August.  The serum M spike was stable.  The lambda light chains were slightly elevated.  Dr. Discussed observation versus beginning daratumumab.  He decided to continue observation.  He is now taking doxycycline once daily.  Objective:  Vital signs in last 24 hours:  Blood pressure 125/63, pulse 70, temperature 98.7 F (37.1 C), temperature source Oral, resp. rate 20, height _0  (1.854 m), weight 204 lb 6.4 oz (92.7 kg), SpO2 98 %.    HEENT: No thrush or bleeding Resp: Lungs clear bilaterally Cardio: Regular rate and rhythm GI: No hepatosplenomegaly Vascular: The right lower leg is larger than the left side  Skin: Resolving ecchymosis surrounding left eye   Lab Results:  Lab Results  Component Value Date   WBC 9.2 03/17/2020   HGB 14.1 03/17/2020   HCT 41.5 03/17/2020   MCV 95.5 03/17/2020   PLT 189.0 03/17/2020   NEUTROABS 5.6 03/17/2020    CMP  Lab Results  Component Value Date   NA 140 04/24/2019   K 4.7 04/24/2019   CL 107 04/24/2019   CO2 26 04/24/2019   GLUCOSE 102 (H) 04/24/2019   BUN 20 04/24/2019   CREATININE 1.32 (H) 04/24/2019   CALCIUM 8.8 (L) 04/24/2019   PROT 6.3 (L) 04/24/2019   ALBUMIN 3.3 (L) 04/24/2019   AST 16 04/24/2019   ALT 12 04/24/2019   ALKPHOS 92 04/24/2019   BILITOT 0.4 04/24/2019   GFRNONAA 53 (L) 04/24/2019   GFRAA >60 04/24/2019    Medications: I have reviewed the patient's current medications.   Assessment/Plan:  Amyloid involving an eyelid biopsy 06/10/2011. "Bruising"  at the eyelids and mouth: Likely related to amyloidosis, persistent. Numbness and loss of vibratory sense at the fingertips: This predated Velcade-based therapy, but worsened. Now much improved following cervical spine surgery. Elevated serum free lambda light chains. The lambda light chains were lower on November 16 and slightly higher on 12/14/2011. Lambda light chain proteinuria. Bone marrow plasmacytosis: Variable increase in plasma cells noted on the bone marrow biopsy 07/29/2011 with plasma cells estimated to represent between 4% and 20% of the cellular population. Remote history of prostate cancer. Sleep apnea. Dyslipidemia. Report of pneumonia on 2 occasions in 2011. Admission to a hospital in [place], [place] October 2012 with "pneumonia." A chest x-ray at [facility] on November 2 was negative .   Low serum immunoglobulin G level. Plasma cell dyscrasia with associated amyloidosis.   Initiation of systemic therapy with Cytoxan, Velcade, and Decadron 08/12/2011. Cycle #2 was initiated on 09/16/2011. Cycle #3 was initiated on 10/15/2011. Velcade was placed on hold due to neuropathy. The serum free lambda light chains were decreased on October 08 2011. The serum free lambda light chains were slightly increased on 12/14/2011, lower on 12/29/2011 and 02/02/2012. Initiation of Revlimid/Decadron February 2013, cycle 2 started on 01/28/2012. High-dose melphalan chemotherapy followed by autologous stem cell infusion on 02/09/2013 Initiation of Velcade/Decadrontherapy 05/28/2013, he completed 2 cycles on a day 1, 4, 8, 11 schedule Improvement in the serum free  lambda light chains following induction Velcade/Decadron   Initiation of weekly Velcade/Decadron on 07/16/2013, last treatment on 10/16/2013 Improvement in the serum free lambda light chains on 08/21/2013 and 09/18/2013 [facility]) Normal serum free lambda light chains on 12/13/2013 Mild increase of the serum free lambda light chains  beginning January 2016-stable  treatment on the Pronto study at [place] beginning 12/17/2015, in  of study 11/23/2016 Started on phase 2b Prothena study-long-term safety/efficacy study 01/11/2017, discontinued May 2018  Doxycycline started 05/17/2017, changed to once daily August 2022 15. Loss of "balance "and proximal motor weakness secondary to cervical stenosis-status post decompression surgery on 02/19/2012. Improved, but not resolved stenosis with mass effect on the spinal cord noted on a repeat MRI 03/28/2012. He underwent further decompression surgery on 04/06/2012. The ataxia, weakness, and peripheral numbness is much improved.   16. Right lower Extremity deep vein thrombosis 02/02/2012-a Doppler ultrasound confirmed a gastrocnemius and peroneal deep vein thrombosis. An IVC filter was placed prior to surgery and he was maintained on Lovenox. The IVC filter was removed on 05/04/2012.   17. Anorexia following cervical spine surgery. Improved.   18. Episode of flank pain and hematuria in 03/31/2012-etiology unclear. He completed a course of antibiotics. The hematuria and pain have resolved.   19. Rectal wall thickening noted on a CT of the pelvis 03/31/2012.   20. colonoscopy showed an area of abnormality at the rectum with biopsy positive for amyloid.   21. Lung nodule noted on the CT 03/31/2012 , no lung nodule on a chest CT 07/30/2013   22. Lumbar stenosis-status post surgery on 07/31/2012.   23. Status post stem cell collection.   24.Rright internal jugular thrombosis, 10/22/2012, likely related to pheresis catheter and HIT   25. Admission with acute right pulmonary embolism 10/27/2012   26. Superior mesenteric vein/portal vein thrombosis diagnosed on a CT of the abdomen 10/28/2012 and 10/29/2012-treated with Argatroban , maintained on Coumadin anticoagulation until he hospital admission March 2014 for stem cell therapy   27. HIT confirmed on a serotonin release assay 10/29/2012   28. History  of Renal insufficiency during the December 2013 hospital admission-likely related to dehydration and? Contrast nephropathy , persistent mild elevation of the creatinine   29. Dysphagia-? Secondary to amyloidosis, reported when he was here on 05/08/2013. He did not complain of dysphagia today   30. Anemia secondary to multiple myeloma and chemotherapy , improved   31.right lower Extremity deep vein thrombosis diagnosed at [facility] July 2014 , repeat Doppler at [facility] 07/19/2013 with chronic thrombus in the right gastrocnemius, Eliquis has been discontinued   32. Fall August 2014 with a left shoulder injury, diagnosed with a rotator cuff tear, status post surgical repair 09/19/2013   33. 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 The neuropathy symptoms have partially improved since Velcade was discontinued after treatment 10/16/2013   34. Cardiac MRI 05/22/2015-normal left ventricular size with severe concentric hypertrophy and mildly impaired systolic function, IZTI-45%, right ventricular hypertrophy, diffuse late gadolinium enhancement in the left and right ventricles-findings consistent with cardiac amyloidosis. Echocardiogram at [facility]-LVEF 50% Echocardiogram at [facility] 04/20/2018 - left ventricular hypertrophy, LVEF 55-60% Echocardiogram at [facility] 08/28/2019-left ventricular ejection fraction 55-60%, left ventricle normal in size with upper normal wall thickness 35. Multinodular goiter confirmed on thyroid ultrasound at [facility] 06/29/2016, amyloidosis of the thyroid confirmed at [facility] 04/20/2018      Disposition:  Brent  Allison appears unchanged.  He has lambda light chain associated amyloidosis.  He has an IgG kappa serum monoclonal protein.  Brent Allison has developed progressive malaise over the past year.  It is unclear whether his symptoms are related to the amyloidosis versus stress related to taking care of his family members.  He reports increased peripheral numbness, potentially related to amyloid  neuropathy.  I doubt the back pain is related to amyloidosis.  He is scheduled to see neurosurgery later this week.  I am reluctant to recommend surgery unless this would clearly benefit him.  I discussed systemic treatment of the amyloidosis with Brent. Allison.  He would need a baseline bone marrow biopsy as recommended by Dr. Amalia Hailey prior to initiating daratumumab.  We reviewed potential toxicities associated with daratumumab.  He will follow-up with neurosurgery later this week and return for further discussion on 11/06/2021.  Betsy Coder, MD  10/27/2021  1:49 PM

## 2021-11-06 DIAGNOSIS — R29898 Other symptoms and signs involving the musculoskeletal system: Secondary | ICD-10-CM | POA: Diagnosis not present

## 2021-11-06 DIAGNOSIS — M542 Cervicalgia: Secondary | ICD-10-CM | POA: Diagnosis not present

## 2021-11-06 DIAGNOSIS — Z981 Arthrodesis status: Secondary | ICD-10-CM | POA: Diagnosis not present

## 2021-11-09 ENCOUNTER — Inpatient Hospital Stay: Payer: Medicare Other | Admitting: Oncology

## 2021-11-09 ENCOUNTER — Telehealth: Payer: Self-pay

## 2021-11-09 NOTE — Telephone Encounter (Signed)
Spoke with pt regarding his appt today. Pt stated that he was not sure that he had an appointment today. Pt also states that he does not have MRI results yet to discuss with MD. Pt will call MD Sherrill's office when he has results. MD Sherrill notified.

## 2021-11-12 DIAGNOSIS — R29898 Other symptoms and signs involving the musculoskeletal system: Secondary | ICD-10-CM | POA: Diagnosis not present

## 2021-11-12 DIAGNOSIS — M2578 Osteophyte, vertebrae: Secondary | ICD-10-CM | POA: Diagnosis not present

## 2021-11-12 DIAGNOSIS — M48061 Spinal stenosis, lumbar region without neurogenic claudication: Secondary | ICD-10-CM | POA: Diagnosis not present

## 2021-11-20 DIAGNOSIS — I10 Essential (primary) hypertension: Secondary | ICD-10-CM | POA: Diagnosis not present

## 2021-11-20 DIAGNOSIS — Z981 Arthrodesis status: Secondary | ICD-10-CM | POA: Diagnosis not present

## 2021-11-20 DIAGNOSIS — Z6827 Body mass index (BMI) 27.0-27.9, adult: Secondary | ICD-10-CM | POA: Diagnosis not present

## 2021-11-20 DIAGNOSIS — Z6826 Body mass index (BMI) 26.0-26.9, adult: Secondary | ICD-10-CM | POA: Diagnosis not present

## 2021-11-20 DIAGNOSIS — M4712 Other spondylosis with myelopathy, cervical region: Secondary | ICD-10-CM | POA: Diagnosis not present

## 2021-11-25 NOTE — Progress Notes (Signed)
11/26/20- 50 yoM former smoker for sleep evaluation with hx OSA, needing CPAP support. NPSG 06/29/03  AHI 23, desat to 83% NPSG 11/27/14- AHI 44.4/ hr, desaturation to 82%, body weight 194 lbs, CPAP to 11. Medical problem list includes r DVT, Mesenteric Thrombosis, PVD, PE, Spastic Quadriparesis, Prostate Cancer, Heparin Induced Thrombocytopenia, Amyloidosis( Dr [name], Dr Valeta Harms) , Multiple Myeloma, Epworth score- 1 CPAP 11/ [employer] Download- compliance 97%, AHI 0.7/ hr Body weight today- 205 lbs Covid vax-3 Phizer Flu vax- had -----Patient is doing good, machine working good, sleeping good. [employer] told him that he needed an appointment He needed to update clinical contact to continue support from DME. Current machine is 31-66 years old and functioning well on fixed pressure 11 with nasal pillows mask.  Reports other medical issues are stable at this visit, with no recent hemoptysis.  He does follow with cardiology.   11/25/21-76 year old male former smoker followed for OSA complicated by r DVT, Mesenteric Thrombosis, PVD, PE, Spastic Quadriparesis, Prostate Cancer, Heparin Induced Thrombocytopenia, Amyloidosis( Dr [name], Dr Valeta Harms) , Multiple Myeloma, Diastolic Dysfunction, CPAP 11/[employer]     Card. AirSense 10  Download compliance-93%, AHI 0.9/ hr Body weight today 202 lbs Covid vax-5 Phizer Flu vax-had URI symptoms few days- runny nose. No fever. Doing well with CPAP.  Current machine is about 77 years old and we agreed to seek replacement, changing to AutoPap 5-15.  Sleep is comfortable. Incidental URI symptoms today started about 2 days ago.  Has not had COVID infection recognized.  No chest involvement so far-mainly runny nose and sneezing.  Claritin helps minimally.   ROS-see HPI   + = positive Constitutional:    weight loss, night sweats, fevers, chills, fatigue, lassitude. HEENT:    headaches, difficulty swallowing, tooth/dental problems, sore throat,       +sneezing, itching, ear ache,  nasal congestion, +post nasal drip, snoring CV:    chest pain, orthopnea, PND, swelling in lower extremities, anasarca,                                  dizziness, palpitations Resp:   shortness of breath with exertion or at rest.                productive cough,   non-productive cough, coughing up of blood.              change in color of mucus.  wheezing.   Skin:    rash or lesions. GI:  No-   heartburn, indigestion, abdominal pain, nausea, vomiting, diarrhea,                 change in bowel habits, loss of appetite GU: dysuria, change in color of urine, no urgency or frequency.   flank pain. MS:   joint pain, stiffness, decreased range of motion, back pain. Neuro-     nothing unusual Psych:  change in mood or affect.  depression or anxiety.   memory loss.  OBJ- Physical Exam General- Alert, Oriented, Affect-appropriate, Distress- none acute Skin- rash-none, lesions- none, excoriation- none Lymphadenopathy- none Head- atraumatic            Eyes- Gross vision intact, PERRLA, conjunctivae and secretions clear            Ears- Hearing, canals-normal            Nose- Clear, no-Septal dev, mucus, polyps, erosion, perforation, +sneezing  Throat- Mallampati III-IV , mucosa clear , drainage- none, tonsils- atrophic Neck- flexible , trachea midline, no stridor , thyroid nl, carotid no bruit Chest - symmetrical excursion , unlabored           Heart/CV- RRR , no murmur , no gallop  , no rub, nl s1 s2                           - JVD- none , edema- none, stasis changes- none, varices- none           Lung- clear to P&A, wheeze- none, cough- none , dullness-none, rub- none           Chest wall-  Abd-  Br/ Gen/ Rectal- Not done, not indicated Extrem- cyanosis- none, clubbing, none, atrophy- none, strength- nl Neuro- grossly intact to observation, sitting in chair

## 2021-11-26 ENCOUNTER — Ambulatory Visit (INDEPENDENT_AMBULATORY_CARE_PROVIDER_SITE_OTHER): Payer: Medicare Other | Admitting: Internal Medicine

## 2021-11-26 ENCOUNTER — Encounter: Payer: Self-pay | Admitting: Internal Medicine

## 2021-11-26 ENCOUNTER — Other Ambulatory Visit: Payer: Self-pay

## 2021-11-26 VITALS — BP 122/60 | HR 75 | Temp 98.5°F | Ht 72.0 in | Wt 202.0 lb

## 2021-11-26 DIAGNOSIS — J00 Acute nasopharyngitis [common cold]: Secondary | ICD-10-CM

## 2021-11-26 DIAGNOSIS — G4733 Obstructive sleep apnea (adult) (pediatric): Secondary | ICD-10-CM | POA: Diagnosis not present

## 2021-11-26 DIAGNOSIS — J069 Acute upper respiratory infection, unspecified: Secondary | ICD-10-CM | POA: Insufficient documentation

## 2021-11-26 MED ORDER — AZELASTINE HCL 0.1 % NA SOLN: NASAL | 12 refills | Status: DC

## 2021-11-26 NOTE — Assessment & Plan Note (Signed)
This is acting like a common cold so far.  He has had COVID-vaccine. Plan-try adding Astelin nasal spray or an OTC cold and flu symptom remedy if helpful.

## 2021-11-26 NOTE — Patient Instructions (Signed)
Script sent for astelin nasal spray - see if this helps dry up your nose.  You can also try Nyquil/ Dayquil or similar.  Order- DME [employer]- please replace old CPAP machine, change to auto- 5-15, mask of choice, humidifier, supplies, AirView/ card  Please call if we can help

## 2021-11-26 NOTE — Assessment & Plan Note (Signed)
Benefits from CPAP with good compliance and control. Plan-order replacement for old machine, changing to AutoPap 5-15

## 2021-11-26 NOTE — Addendum Note (Signed)
Addended by: Elby Beck R on: 11/26/2021 01:58 PM   Modules accepted: Orders

## 2021-11-27 ENCOUNTER — Other Ambulatory Visit: Payer: Self-pay | Admitting: Neurosurgery

## 2021-12-09 ENCOUNTER — Encounter: Payer: Self-pay | Admitting: Oncology

## 2021-12-09 ENCOUNTER — Other Ambulatory Visit: Payer: Self-pay | Admitting: Neurosurgery

## 2021-12-10 ENCOUNTER — Inpatient Hospital Stay: Payer: Medicare Other | Attending: Oncology | Admitting: Oncology

## 2021-12-10 ENCOUNTER — Encounter: Payer: Self-pay | Admitting: *Deleted

## 2021-12-10 ENCOUNTER — Other Ambulatory Visit: Payer: Self-pay

## 2021-12-10 VITALS — BP 135/64 | HR 75 | Temp 98.2°F | Resp 19 | Ht 72.0 in | Wt 200.0 lb

## 2021-12-10 DIAGNOSIS — Z86711 Personal history of pulmonary embolism: Secondary | ICD-10-CM | POA: Diagnosis not present

## 2021-12-10 DIAGNOSIS — Z86718 Personal history of other venous thrombosis and embolism: Secondary | ICD-10-CM | POA: Diagnosis not present

## 2021-12-10 DIAGNOSIS — E8581 Light chain (AL) amyloidosis: Secondary | ICD-10-CM

## 2021-12-10 DIAGNOSIS — Z8546 Personal history of malignant neoplasm of prostate: Secondary | ICD-10-CM | POA: Insufficient documentation

## 2021-12-10 DIAGNOSIS — Z79899 Other long term (current) drug therapy: Secondary | ICD-10-CM | POA: Insufficient documentation

## 2021-12-10 DIAGNOSIS — E859 Amyloidosis, unspecified: Secondary | ICD-10-CM | POA: Diagnosis not present

## 2021-12-10 DIAGNOSIS — Z7901 Long term (current) use of anticoagulants: Secondary | ICD-10-CM | POA: Diagnosis not present

## 2021-12-10 NOTE — Progress Notes (Signed)
Nurse Maggie w/Dr. Earle Gell is not able to send his December 2022 radiology reports to Dr. [name]. Directed to call HIM. Left VM for Beth in HIM at ext [phone] for report to be faxed to [phone] per request of Dr. [name].

## 2021-12-10 NOTE — Progress Notes (Signed)
Brent Allison OFFICE PROGRESS NOTE   Diagnosis: Amyloidosis  INTERVAL HISTORY:   Brent Allison returns as scheduled.  He saw Dr. Arnoldo Morale to evaluate leg weakness.  He reports being diagnosed with cervical disc disease and is scheduled for surgery 12/21/2021.  He has mid back pain.  No arm or hand weakness. He has intermittent bruising around the eyes.  Brent Allison continues to have malaise.  Objective:  Vital signs in last 24 hours:  Blood pressure 135/64, pulse 75, temperature 98.2 F (36.8 C), temperature source Oral, resp. rate 19, height 6' (1.829 m), weight 200 lb (90.7 kg), SpO2 99 %.    HEENT: Macroglossia, no bleeding Resp: Lungs clear bilaterally Cardio: Regular rate and rhythm with premature beats GI: No hepatosplenomegaly Vascular: Right lower leg is slightly larger than the left side Neuro: The motor exam appears intact in the upper and lower extremities bilaterally with decreased strength on abduction at the left shoulder     Lab Results:  Lab Results  Component Value Date   WBC 9.2 03/17/2020   HGB 14.1 03/17/2020   HCT 41.5 03/17/2020   MCV 95.5 03/17/2020   PLT 189.0 03/17/2020   NEUTROABS 5.6 03/17/2020    CMP  Lab Results  Component Value Date   NA 140 04/24/2019   K 4.7 04/24/2019   CL 107 04/24/2019   CO2 26 04/24/2019   GLUCOSE 102 (H) 04/24/2019   BUN 20 04/24/2019   CREATININE 1.32 (H) 04/24/2019   CALCIUM 8.8 (L) 04/24/2019   PROT 6.3 (L) 04/24/2019   ALBUMIN 3.3 (L) 04/24/2019   AST 16 04/24/2019   ALT 12 04/24/2019   ALKPHOS 92 04/24/2019   BILITOT 0.4 04/24/2019   GFRNONAA 53 (L) 04/24/2019   GFRAA >60 04/24/2019    Medications: I have reviewed the patient's current medications.   Assessment/Plan: Amyloid involving an eyelid biopsy 06/10/2011. "Bruising" at the eyelids and mouth: Likely related to amyloidosis, persistent. Numbness and loss of vibratory sense at the fingertips: This predated Velcade-based therapy, but  worsened. Now much improved following cervical spine surgery. Elevated serum free lambda light chains. The lambda light chains were lower on November 16 and slightly higher on 12/14/2011. Lambda light chain proteinuria. Bone marrow plasmacytosis: Variable increase in plasma cells noted on the bone marrow biopsy 07/29/2011 with plasma cells estimated to represent between 4% and 20% of the cellular population. Remote history of prostate cancer. Sleep apnea. Dyslipidemia. Report of pneumonia on 2 occasions in 2011. Admission to a hospital in [place], [place] October 2012 with "pneumonia." A chest x-ray at [facility] on November 2 was negative .   Low serum immunoglobulin G level. Plasma cell dyscrasia with associated amyloidosis.   Initiation of systemic therapy with Cytoxan, Velcade, and Decadron 08/12/2011. Cycle #2 was initiated on 09/16/2011. Cycle #3 was initiated on 10/15/2011. Velcade was placed on hold due to neuropathy. The serum free lambda light chains were decreased on October 08 2011. The serum free lambda light chains were slightly increased on 12/14/2011, lower on 12/29/2011 and 02/02/2012. Initiation of Revlimid/Decadron February 2013, cycle 2 started on 01/28/2012. High-dose melphalan chemotherapy followed by autologous stem cell infusion on 02/09/2013 Initiation of Velcade/Decadrontherapy 05/28/2013, he completed 2 cycles on a day 1, 4, 8, 11 schedule Improvement in the serum free lambda light chains following induction Velcade/Decadron   Initiation of weekly Velcade/Decadron on 07/16/2013, last treatment on 10/16/2013 Improvement in the serum free lambda light chains on 08/21/2013 and 09/18/2013 [facility]) Normal serum free lambda  light chains on 12/13/2013 Mild increase of the serum free lambda light chains beginning January 2016-stable  treatment on the Pronto study at [facility] beginning 12/17/2015, in  of study 11/23/2016 Started on phase 2b Prothena study-long-term  safety/efficacy study 01/11/2017, discontinued May 2018  Doxycycline started 05/17/2017, changed to once daily August 2022 15. Loss of "balance "and proximal motor weakness secondary to cervical stenosis-status post decompression surgery on 02/19/2012. Improved, but not resolved stenosis with mass effect on the spinal cord noted on a repeat MRI 03/28/2012. He underwent further decompression surgery on 04/06/2012. The ataxia, weakness, and peripheral numbness is much improved.   16. Right lower Extremity deep vein thrombosis 02/02/2012-a Doppler ultrasound confirmed a gastrocnemius and peroneal deep vein thrombosis. An IVC filter was placed prior to surgery and he was maintained on Lovenox. The IVC filter was removed on 05/04/2012.   17. Anorexia following cervical spine surgery. Improved.   18. Episode of flank pain and hematuria in 03/31/2012-etiology unclear. He completed a course of antibiotics. The hematuria and pain have resolved.   19. Rectal wall thickening noted on a CT of the pelvis 03/31/2012.   20. colonoscopy showed an area of abnormality at the rectum with biopsy positive for amyloid.   21. Lung nodule noted on the CT 03/31/2012 , no lung nodule on a chest CT 07/30/2013   22. Lumbar stenosis-status post surgery on 07/31/2012.   23. Status post stem cell collection.   24.Rright internal jugular thrombosis, 10/22/2012, likely related to pheresis catheter and HIT   25. Admission with acute right pulmonary embolism 10/27/2012   26. Superior mesenteric vein/portal vein thrombosis diagnosed on a CT of the abdomen 10/28/2012 and 10/29/2012-treated with Argatroban , maintained on Coumadin anticoagulation until he hospital admission March 2014 for stem cell therapy   27. HIT confirmed on a serotonin release assay 10/29/2012   28. History of Renal insufficiency during the December 2013 hospital admission-likely related to dehydration and? Contrast nephropathy , persistent mild elevation of the  creatinine   29. Dysphagia-? Secondary to amyloidosis, reported when he was here on 05/08/2013. He did not complain of dysphagia today   30. Anemia secondary to multiple myeloma and chemotherapy , improved   31.right lower Extremity deep vein thrombosis diagnosed at [facility] July 2014 , repeat Doppler at [facility] 07/19/2013 with chronic thrombus in the right gastrocnemius, Eliquis has been discontinued   32. Fall August 2014 with a left shoulder injury, diagnosed with a rotator cuff tear, status post surgical repair 09/19/2013   33. 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 The neuropathy symptoms have partially improved since Velcade was discontinued after treatment 10/16/2013   34. Cardiac MRI 05/22/2015-normal left ventricular size with severe concentric hypertrophy and mildly impaired systolic function, [id number]%, right ventricular hypertrophy, diffuse late gadolinium enhancement in the left and right ventricles-findings consistent with cardiac amyloidosis. Echocardiogram at [facility]-LVEF 50% Echocardiogram at [facility] [place] [facility] 04/20/2018 - left ventricular hypertrophy, LVEF 55-60% Echocardiogram at [facility] 08/28/2019-left ventricular ejection fraction 55-60%, left ventricle normal in size with upper normal wall thickness 35. Multinodular goiter confirmed on thyroid ultrasound at [facility] 06/29/2016, amyloidosis of the thyroid confirmed at [facility] 04/20/2018        Disposition: Brent Allison appears unchanged.  He is scheduled for cervical spine surgery 12/21/2021.  We discussed initiating treatment for amyloidosis with single agent daratumumab.  I reviewed potential toxicities associated with daratumumab.  He is scheduled for cardiology follow-up at [facility] 12/18/2021.  He is scheduled to see Dr. Amalia Hailey 01/06/2022.  Brent. Allison will return  for an office visit here 01/12/2022.  We will discuss initiating a trial of daratumumab therapy based on the evaluation by Dr. Amalia Hailey.  Betsy Coder, MD  12/10/2021  9:21 AM

## 2021-12-14 NOTE — Pre-Procedure Instructions (Signed)
Surgical Instructions    Your procedure is scheduled on Monday, January 30th.  Report to [facility] Main Entrance "A" at 9:45 A.M., then check in with the Admitting office.  Call this number if you have problems the morning of surgery:  [phone]   If you have any questions prior to your surgery date call [phone]: Open Monday-Friday 8am-4pm    Remember:  Do not eat or drink after midnight the night before your surgery    Take these medicines the morning of surgery with A SIP OF WATER  doxycycline (VIBRA-TABS)  rosuvastatin (CRESTOR)  As of today, STOP taking any Aspirin (unless otherwise instructed by your surgeon) Aleve, Naproxen, Ibuprofen, Motrin, Advil, Goody's, BC's, all herbal medications, fish oil, and all vitamins. This includes: diclofenac (VOLTAREN)                     Do NOT Smoke (Tobacco/Vaping) for 24 hours prior to your procedure.  If you use a CPAP at night, you may bring your mask/headgear for your overnight stay.   Contacts, glasses, piercing's, hearing aid's, dentures or partials may not be worn into surgery, please bring cases for these belongings.    For patients admitted to the hospital, discharge time will be determined by your treatment team.   Patients discharged the day of surgery will not be allowed to drive home, and someone needs to stay with them for 24 hours.  NO VISITORS WILL BE ALLOWED IN PRE-OP WHERE PATIENTS ARE PREPPED FOR SURGERY.  ONLY 1 SUPPORT PERSON MAY BE PRESENT IN THE WAITING ROOM WHILE YOU ARE IN SURGERY.  IF YOU ARE TO BE ADMITTED, ONCE YOU ARE IN YOUR ROOM YOU WILL BE ALLOWED TWO (2) VISITORS. (1) VISITOR MAY STAY OVERNIGHT BUT MUST ARRIVE TO THE ROOM BY 8pm.  Minor children may have two parents present. Special consideration for safety and communication needs will be reviewed on a case by case basis.   Special instructions:   [place]- Preparing For Surgery  Before surgery, you can play an important role. Because skin is  not sterile, your skin needs to be as free of germs as possible. You can reduce the number of germs on your skin by washing with CHG (chlorahexidine gluconate) Soap before surgery.  CHG is an antiseptic cleaner which kills germs and bonds with the skin to continue killing germs even after washing.    Oral Hygiene is also important to reduce your risk of infection.  Remember - BRUSH YOUR TEETH THE MORNING OF SURGERY WITH YOUR REGULAR TOOTHPASTE  Please do not use if you have an allergy to CHG or antibacterial soaps. If your skin becomes reddened/irritated stop using the CHG.  Do not shave (including legs and underarms) for at least 48 hours prior to first CHG shower. It is OK to shave your face.  Please follow these instructions carefully.   Shower the NIGHT BEFORE SURGERY and the MORNING OF SURGERY  If you chose to wash your hair, wash your hair first as usual with your normal shampoo.  After you shampoo, rinse your hair and body thoroughly to remove the shampoo.  Use CHG Soap as you would any other liquid soap. You can apply CHG directly to the skin and wash gently with a scrungie or a clean washcloth.   Apply the CHG Soap to your body ONLY FROM THE NECK DOWN.  Do not use on open wounds or open sores. Avoid contact with your eyes, ears, mouth and genitals (  private parts). Wash Face and genitals (private parts)  with your normal soap.   Wash thoroughly, paying special attention to the area where your surgery will be performed.  Thoroughly rinse your body with warm water from the neck down.  DO NOT shower/wash with your normal soap after using and rinsing off the CHG Soap.  Pat yourself dry with a CLEAN TOWEL.  Wear CLEAN PAJAMAS to bed the night before surgery  Place CLEAN SHEETS on your bed the night before your surgery  DO NOT SLEEP WITH PETS.   Day of Surgery: Shower with CHG soap. Do not wear jewelry. Do not wear lotions, powders, colognes, or deodorant. Men may shave face  and neck. Do not bring valuables to the hospital. [facility] is not responsible for any belongings or valuables. Wear Clean/Comfortable clothing the morning of surgery Remember to brush your teeth WITH YOUR REGULAR TOOTHPASTE.   Please read over the following fact sheets that you were given.   3 days prior to your procedure or After your COVID test   You are not required to quarantine however you are required to wear a well-fitting mask when you are out and around people not in your household. If your mask becomes wet or soiled, replace with a new one.   Wash your hands often with soap and water for 20 seconds or clean your hands with an alcohol-based hand sanitizer that contains at least 60% alcohol.   Do not share personal items.   Notify your provider:  o if you are in close contact with someone who has COVID  o or if you develop a fever of 100.4 or greater, sneezing, cough, sore throat, shortness of breath or body aches.

## 2021-12-15 ENCOUNTER — Encounter (HOSPITAL_COMMUNITY)
Admission: RE | Admit: 2021-12-15 | Discharge: 2021-12-15 | Disposition: A | Discharge status: Home / Self Care | Payer: Medicare Other | Source: Ambulatory Visit | Attending: Neurosurgery | Admitting: Neurosurgery

## 2021-12-15 ENCOUNTER — Other Ambulatory Visit: Payer: Self-pay

## 2021-12-15 ENCOUNTER — Encounter (HOSPITAL_COMMUNITY): Payer: Self-pay

## 2021-12-15 VITALS — BP 133/69 | HR 70 | Temp 97.9°F | Resp 18 | Ht 72.0 in | Wt 199.6 lb

## 2021-12-15 DIAGNOSIS — I5030 Unspecified diastolic (congestive) heart failure: Secondary | ICD-10-CM | POA: Insufficient documentation

## 2021-12-15 DIAGNOSIS — Z01818 Encounter for other preprocedural examination: Secondary | ICD-10-CM

## 2021-12-15 DIAGNOSIS — I129 Hypertensive chronic kidney disease with stage 1 through stage 4 chronic kidney disease, or unspecified chronic kidney disease: Secondary | ICD-10-CM | POA: Diagnosis not present

## 2021-12-15 DIAGNOSIS — N189 Chronic kidney disease, unspecified: Secondary | ICD-10-CM | POA: Insufficient documentation

## 2021-12-15 DIAGNOSIS — Z87891 Personal history of nicotine dependence: Secondary | ICD-10-CM | POA: Insufficient documentation

## 2021-12-15 DIAGNOSIS — Z86718 Personal history of other venous thrombosis and embolism: Secondary | ICD-10-CM | POA: Insufficient documentation

## 2021-12-15 DIAGNOSIS — G8250 Quadriplegia, unspecified: Secondary | ICD-10-CM | POA: Insufficient documentation

## 2021-12-15 DIAGNOSIS — Z86711 Personal history of pulmonary embolism: Secondary | ICD-10-CM | POA: Diagnosis not present

## 2021-12-15 DIAGNOSIS — M4712 Other spondylosis with myelopathy, cervical region: Secondary | ICD-10-CM | POA: Diagnosis not present

## 2021-12-15 DIAGNOSIS — G4733 Obstructive sleep apnea (adult) (pediatric): Secondary | ICD-10-CM | POA: Diagnosis not present

## 2021-12-15 DIAGNOSIS — M4722 Other spondylosis with radiculopathy, cervical region: Secondary | ICD-10-CM | POA: Diagnosis not present

## 2021-12-15 DIAGNOSIS — E785 Hyperlipidemia, unspecified: Secondary | ICD-10-CM | POA: Diagnosis not present

## 2021-12-15 DIAGNOSIS — Z01812 Encounter for preprocedural laboratory examination: Secondary | ICD-10-CM | POA: Insufficient documentation

## 2021-12-15 HISTORY — DX: Chronic kidney disease, unspecified: N18.9

## 2021-12-15 LAB — TYPE AND SCREEN
ABO/RH(D): A POS
Antibody Screen: NEGATIVE

## 2021-12-15 LAB — CBC
HCT: 40.3 % (ref 39.0–52.0)
Hemoglobin: 13.2 g/dL (ref 13.0–17.0)
MCH: 33.2 pg (ref 26.0–34.0)
MCHC: 32.8 g/dL (ref 30.0–36.0)
MCV: 101.3 fL — ABNORMAL HIGH (ref 80.0–100.0)
Platelets: 181 10*3/uL (ref 150–400)
RBC: 3.98 MIL/uL — ABNORMAL LOW (ref 4.22–5.81)
RDW: 13.1 % (ref 11.5–15.5)
WBC: 8.4 10*3/uL (ref 4.0–10.5)
nRBC: 0 % (ref 0.0–0.2)

## 2021-12-15 LAB — SURGICAL PCR SCREEN
MRSA, PCR: NEGATIVE
Staphylococcus aureus: NEGATIVE

## 2021-12-15 LAB — BASIC METABOLIC PANEL
Anion gap: 7 (ref 5–15)
BUN: 16 mg/dL (ref 8–23)
CO2: 26 mmol/L (ref 22–32)
Calcium: 9.2 mg/dL (ref 8.9–10.3)
Chloride: 107 mmol/L (ref 98–111)
Creatinine, Ser: 1.4 mg/dL — ABNORMAL HIGH (ref 0.61–1.24)
GFR, Estimated: 52 mL/min — ABNORMAL LOW (ref >60)
Glucose, Bld: 95 mg/dL (ref 70–99)
Potassium: 3.9 mmol/L (ref 3.5–5.1)
Sodium: 140 mmol/L (ref 135–145)

## 2021-12-15 NOTE — Progress Notes (Signed)
PCP - Cristie Hem, MD Cardiologist - Dr. Roxy Cedar [facility] Cardiology  PPM/ICD - n/a  Chest x-ray - n/a EKG - 12/15/21 Stress Test -06/01/21  ECHO - 02/16/21 Cardiac Cath - 2010  Sleep Study - OSA+ CPAP - uses nightly  Blood Thinner Instructions: n/a Aspirin Instructions: n/a  NPO at MD  COVID TEST- Scheduled for 12/17/21 at 1000 in PAT.   Anesthesia review: Yes, EKG review.  Patient denies shortness of breath, fever, cough and chest pain at PAT appointment   All instructions explained to the patient, with a verbal understanding of the material. Patient agrees to go over the instructions while at home for a better understanding. Patient also instructed to self quarantine after being tested for COVID-19. The opportunity to ask questions was provided.

## 2021-12-16 ENCOUNTER — Encounter (HOSPITAL_COMMUNITY): Payer: Self-pay

## 2021-12-16 NOTE — Anesthesia Preprocedure Evaluation (Addendum)
Anesthesia Evaluation  Patient identified by MRN, date of birth, ID band Patient awake    Reviewed: Allergy & Precautions, NPO status   History of Anesthesia Complications Negative for: history of anesthetic complications  Airway Mallampati: II  TM Distance: >3 FB Neck ROM: Full    Dental  (+) Caps, Dental Advisory Given   Pulmonary sleep apnea and Continuous Positive Airway Pressure Ventilation , former smoker,    Pulmonary exam normal        Cardiovascular Normal cardiovascular exam  Nuclear stress test 06/01/21 [facility]): Impressions: - Normal myocardial perfusion study  - No evidence of any significant ischemia or scar  - Left ventricular systolic functionis normal. Post stress the ejection  fraction is calculated at 54%.  - Mild coronary calcifications are noted    Echo 02/16/21 [facility]): Summary  1. The left ventricle is normal in size with mildly to moderately increased  wall thickness.  2. The left ventricular systolic function is normal, LVEF is visually  estimated at > 55%.  3. There is grade II diastolic dysfunction (elevated filling pressure).  4. The right ventricle is normal in size, with normal systolic function.    Neuro/Psych negative psych ROS   GI/Hepatic negative GI ROS, Neg liver ROS,   Endo/Other  negative endocrine ROS  Renal/GU Renal InsufficiencyRenal disease     Musculoskeletal   Abdominal   Peds  Hematology  (+) Blood dyscrasia, , Multiple myeloma   Anesthesia Other Findings   Reproductive/Obstetrics                            Anesthesia Physical Anesthesia Plan  ASA: 3  Anesthesia Plan: General   Post-op Pain Management: Celebrex PO (pre-op) and Tylenol PO (pre-op)   Induction: Intravenous  PONV Risk Score and Plan: 4 or greater and Ondansetron, Dexamethasone and Aprepitant  Airway Management Planned: Oral ETT and Video Laryngoscope  Planned  Additional Equipment:   Intra-op Plan:   Post-operative Plan: Extubation in OR  Informed Consent: I have reviewed the patients History and Physical, chart, labs and discussed the procedure including the risks, benefits and alternatives for the proposed anesthesia with the patient or authorized representative who has indicated his/her understanding and acceptance.     Dental advisory given  Plan Discussed with: Anesthesiologist  Anesthesia Plan Comments: (Video laryngoscope use due to myelopathy.  PAT note written 12/16/2021 by Myra Gianotti, PA-C. )     Anesthesia Quick Evaluation

## 2021-12-16 NOTE — Progress Notes (Signed)
Anesthesia Chart Review:  Case: [id number] Date/Time: 12/21/21 1139   Procedure: ACDF, IP,PLATE/SCREWS C56 - 3C   Anesthesia type: General   Pre-op diagnosis: CERVICAL SPONDYLOSIS WITH MYELOPATHY AND RADICULOPATHY   Location: [facility] OR ROOM 20 / [place] OR   Surgeons: Newman Pies, MD       DISCUSSION: Patient is a 76 year old male scheduled for the above procedure.  History includes former smoker (quit 07/21/79), AL Amyloidosis (+ eyelid biopsy 06/10/11; + involvement of heart 2016 & thyroid 2019, plasma cell dyscrasia (with associated amyloid; treated with Cytoxan, Velcade & Decadron 2012, Revlimid & Decadron 2013, high dose melphalan followed by stem cell transplant 2014, Velcade & Decadron 2014, Pronto study at [facility] 11/2015-03/2017; peripheral neuropathy related to AL & prior therapy), Multiple Myeloma, hemoptysis (after coughing, s/p bronchoscopy: bleeding from [facility] 07/2019, resolved), dyslipidemia, DVT/PE (RLE DVT 02/02/12; right IJ thrombus 10/22/12 likely r/t pheresis catheter & HIT; right PE 10/27/12; SMA/portal vein thrombosis on CT 10/28/12 on anticoagulation until 01/2013), heparin induced thrombocytopenia (+ HIT 10/29/12), OSA (severe OSA with AHI 44.4/hr 11/27/14; uses CPAP), prostate cancer (prostatectomy ~ 2005), spinal surgery (L4-5 PLIF 09/27/07; C3-5 ACDF 02/26/12; C2-5 posterior fusion 04/06/12; L2-4 laminectomy/posterior arthrodesis 07/31/12; C7-T2 posterior arthrodesis 06/02/15), CKD, spastic quadriparesis.  Last evaluation by oncologist Dr. [name] 12/10/21. He is aware of surgery plans. He discussed initiating treatment for amyloidosis with single agent daratumumab which had been considered at his last visit with  [facility] oncologist Dr. Amalia Hailey.  Brent Allison had been having progressive fatigue and peripheral neuropathy with continued slow rise in serum free lambda. He was also on doxycycline prophylaxis.    Last cardiology visit with Dr.Byku was on 05/08/21.  He was referred by Dr.  [name] for management of diastolic heart failure secondary to AL amyloid cardiomyopathy. NYHA class II, overall stable.  On Lasix and spironolactone. Since he had gradual progression of fatigue, a nuclear stress test ordered which was nonischemic on 06/01/21. 02/16/21 echo showed mild to moderately thickened LV, EF > 55%, grade II diastolic dysfunction. Nex follow-up visit is scheduled for 12/18/21.   Preoperative COVID-19 test is scheduled for 12/17/2021. He received Evusheld injection 07/29/21. Anesthesia team to evaluate on the day of surgery.    VS: BP 133/69    Pulse 70    Temp 36.6 C (Oral)    Resp 18    Ht 6' (1.829 m)    Wt 90.5 kg    SpO2 100%    BMI 27.07 kg/m    PROVIDERS: Michael Boston, MD is PCP  - Baird Lyons, MD is pulmonologist ([facility]). Last visit 11/26/21.  Had mild URI symptoms at that time, but was doing well with CPAP. June Leap, DO is pulmonologist. Last visit 11/17/20 for follow-up resolved hemoptysis since 07/2019. Question of left lung nodules on prior CXR, but no evidence of pulmonary neoplasm on 12/04/19 CT. Follow-up in 2 years. Referred to [facility] provider.  [name], Izola Price, MD is HEM-ONC.  Last visit 12/10/21. He is also followed by [facility] oncologist Effie Berkshire, MD who specializes in Multiple Myeloma and Amyloidosis. Last office visit 07/22/21. Rolene Course, MD is cardiologist [facility]). Last visit 05/08/21.    LABS: Labs reviewed: Acceptable for surgery. Cr 1.40, but consistent with previous results. LFTs normal 07/22/21 [facility]) (all labs ordered are listed, but only abnormal results are displayed)  Labs Reviewed  CBC - Abnormal; Notable for the following components:      Result Value   RBC 3.98 (*)  MCV 101.3 (*)    All other components within normal limits  BASIC METABOLIC PANEL - Abnormal; Notable for the following components:   Creatinine, Ser 1.40 (*)    GFR, Estimated 52 (*)    All other components within normal limits  SURGICAL PCR SCREEN  TYPE AND  SCREEN    IMAGES: MRI L-spine 11/12/21 (Canopy/PACS): IMPRESSION: 1. Previous decompression and fusion from L2 through L5 with no adverse features. 2. Largely stable adjacent segment disease at L1-L2 with multifactorial mild spinal stenosis at the level of the conus, and severe right L1 foraminal stenosis. Faint degenerative L1 inferior endplate edema. 3. Stable adjacent segment disease at L5-S1 with moderate to severe right greater than left L5 foraminal stenosis.  MRI C-spine 11/12/21 (Canopy/PACS): IMPRESSION: 1. Extensive postoperative changes to the cervical spine, with lower cervical and cervicothoracic junction posterior fusion since a 2016 MRI. Solid arthrodesis likely from C2 through C5. 2. Bulky adjacent segment disease at C5-C6 especially ligament flavum hypertrophy. Mild spinal stenosis and mild spinal cord mass effect there with moderate to severe C6 foraminal stenosis greater on the left. No associated spinal cord signal abnormality. 3. Evidence of medial positioning left C7 pedicle or laminar screw. Posteriorly decompressed T1-T2 level with no adverse features.   EKG: 12/15/21: Sinus rhythm with 1st degree A-V block Left axis deviation Septal infarct , age undetermined Abnormal ECG Confirmed by Eleonore Chiquito [phone]) on 12/15/2021 8:08:41 PM - PVCs have resolved and QT interval shorter when compared to 06/02/15 tracing.   CV: Nuclear stress test 06/01/21 [facility]): Impressions: - Normal myocardial perfusion study  - No evidence of any significant ischemia or scar  - Left ventricular systolic function is normal. Post stress the ejection  fraction is calculated at 54%.  - Mild coronary calcifications are noted    Echo 02/16/21 [facility]): Summary    1. The left ventricle is normal in size with mildly to moderately increased  wall thickness.    2. The left ventricular systolic function is normal, LVEF is visually  estimated at > 55%.    3. There is grade II  diastolic dysfunction (elevated filling pressure).    4. The right ventricle is normal in size, with normal systolic function.     MRI Cardiac 05/22/15: IMPRESSION: 1. Normal left ventricular size with severe concentric hypertrophy and mildly impaired systolic function (LVEF = 45%). There are no regional wall motion abnormalities.  There is severe diffuse late gadolinium enhancement of almost all left ventricular segments and partially involving right ventricular wall.  2. Normal right ventricular size, with mild right ventricular hypertrophy and normal systolic function (RVEF = 55%).  3. Mild mitral and tricuspid regurgitation.  Collectively, these findings are consistent with infiltrative cardiomyopathy - cardiac amyloidosis with bi-ventricular involvement.    Cardiac cath 11/18/19: IMPRESSION: No significant coronary artery disease. Normal ventricular function. No abdominal aortic aneurysm. Normal hemodynamics.   Past Medical History:  Diagnosis Date   AL amyloidosis ([place]) 11/04/2012   Arthritis    lumbar DDD   Blood dyscrasia    plasma cell dyscrasia with associated amyloidosis   DVT (deep venous thrombosis) (HCC)    right   Dyslipidemia    H/O multiple myeloma    Heparin induced thrombocytopenia (HIT) 11/04/2012   Joint pain    Peripheral vascular disease ([place])    DVT- 01/2012, follows by Dr. [name], lovenox maintained since Spring 2013 , pt. aware that last dose is sch. for 07/22/2012   Pneumonia  hx of with last time in Nov 2012   Prostate cancer [facility])    amyloidosis, multiple myeloma    Sleep apnea    severe with AHI 44.4 events per hour - now on CPAP at 11cm H2O   Spastic quadriparesis [facility])     Past Surgical History:  Procedure Laterality Date   ANTERIOR CERVICAL DECOMP/DISCECTOMY FUSION  02/26/2012   Procedure: ANTERIOR CERVICAL DECOMPRESSION/DISCECTOMY FUSION 2 LEVELS;  Surgeon: Hosie Spangle, MD;  Location: [place] NEURO ORS;  Service:  Neurosurgery;  Laterality: N/A;  C3-4 C4-5 Anterior cervical decompression/diskectomy, fusion   BACK SURGERY     CARDIAC CATHETERIZATION  2010   CARPAL TUNNEL RELEASE  2009   left   COLONOSCOPY     HERNIA REPAIR     around 1985   ivt     IVC filter placed d/t blood clots, removed- July- 2013   LIMBAL STEM CELL TRANSPLANT     LIPOMA EXCISION  around [place]   removed from one of his shoulders   POSTERIOR CERVICAL FUSION/FORAMINOTOMY  04/06/2012   Procedure: POSTERIOR CERVICAL FUSION/FORAMINOTOMY LEVEL 3;  Surgeon: Hosie Spangle, MD;  Location: [facility]S;  Service: Neurosurgery;  Laterality: N/A;  C3 and C4 laminectomy with C23 C34 C45 posterior cervical arthrodesis with instrumentation   POSTERIOR CERVICAL FUSION/FORAMINOTOMY N/A 06/02/2015   Procedure: Cervical Seven, Thoracic One, Thoracic Two Laminectomies with Cervical Seven-Thoracic One, Thoracic One-Thoracic Two Posterior Cervico-Thoracic  Arthrodesis with Instrumentation;  Surgeon: Jovita Gamma, MD;  Location: [place] NEURO ORS;  Service: Neurosurgery;  Laterality: N/A;  C7, T1, T2 laminectomies with C7-T2 cerevico thoracic arthrodesis with instrumentation   POSTERIOR LAMINECTOMY / DECOMPRESSION LUMBAR SPINE  around 2007   L4-5   PROSTATECTOMY  around 2005   TONSILLECTOMY     as a child    MEDICATIONS:  Cyanocobalamin (VITAMIN B 12 PO)   diclofenac (VOLTAREN) 75 MG EC tablet   diphenhydrAMINE (BENADRYL) 25 MG tablet   doxycycline (VIBRA-TABS) 100 MG tablet   furosemide (LASIX) 20 MG tablet   rosuvastatin (CRESTOR) 10 MG tablet   spironolactone (ALDACTONE) 25 MG tablet   No current facility-administered medications for this encounter.    Myra Gianotti, PA-C Surgical Short Stay/Anesthesiology [facility] Phone [phone] [facility] Phone [phone] 12/16/2021 6:23 PM

## 2021-12-17 ENCOUNTER — Other Ambulatory Visit (HOSPITAL_COMMUNITY)
Admission: RE | Admit: 2021-12-17 | Discharge: 2021-12-17 | Disposition: A | Discharge status: Home / Self Care | Payer: Medicare Other | Source: Ambulatory Visit | Attending: Neurosurgery | Admitting: Neurosurgery

## 2021-12-17 DIAGNOSIS — Z20822 Contact with and (suspected) exposure to covid-19: Secondary | ICD-10-CM | POA: Diagnosis not present

## 2021-12-17 DIAGNOSIS — Z01812 Encounter for preprocedural laboratory examination: Secondary | ICD-10-CM | POA: Insufficient documentation

## 2021-12-17 DIAGNOSIS — Z01818 Encounter for other preprocedural examination: Secondary | ICD-10-CM

## 2021-12-17 LAB — SARS CORONAVIRUS 2 BY RT PCR (HOSPITAL ORDER, PERFORMED IN ~~LOC~~ HOSPITAL LAB): SARS Coronavirus 2: NEGATIVE

## 2021-12-18 DIAGNOSIS — I43 Cardiomyopathy in diseases classified elsewhere: Secondary | ICD-10-CM | POA: Diagnosis not present

## 2021-12-18 DIAGNOSIS — E854 Organ-limited amyloidosis: Secondary | ICD-10-CM | POA: Diagnosis not present

## 2021-12-18 DIAGNOSIS — I5189 Other ill-defined heart diseases: Secondary | ICD-10-CM | POA: Diagnosis not present

## 2021-12-18 DIAGNOSIS — I5032 Chronic diastolic (congestive) heart failure: Secondary | ICD-10-CM | POA: Diagnosis not present

## 2021-12-18 DIAGNOSIS — E8581 Light chain (AL) amyloidosis: Secondary | ICD-10-CM | POA: Diagnosis not present

## 2021-12-18 DIAGNOSIS — Z6827 Body mass index (BMI) 27.0-27.9, adult: Secondary | ICD-10-CM | POA: Diagnosis not present

## 2021-12-21 ENCOUNTER — Other Ambulatory Visit: Payer: Self-pay

## 2021-12-21 ENCOUNTER — Ambulatory Visit (HOSPITAL_COMMUNITY): Payer: Medicare Other | Admitting: Certified Registered Nurse Anesthetist

## 2021-12-21 ENCOUNTER — Ambulatory Visit (HOSPITAL_COMMUNITY)
Admission: RE | Admit: 2021-12-21 | Discharge: 2021-12-22 | Disposition: A | Discharge status: Home / Self Care | Payer: Medicare Other | Attending: Neurosurgery | Admitting: Neurosurgery

## 2021-12-21 ENCOUNTER — Encounter (HOSPITAL_COMMUNITY): Payer: Self-pay | Admitting: Neurosurgery

## 2021-12-21 ENCOUNTER — Encounter (HOSPITAL_COMMUNITY)
Admission: RE | Disposition: A | Discharge status: Home / Self Care | Payer: Self-pay | Source: Home / Self Care | Attending: Neurosurgery

## 2021-12-21 ENCOUNTER — Ambulatory Visit (HOSPITAL_COMMUNITY): Payer: Medicare Other | Admitting: Vascular Surgery

## 2021-12-21 ENCOUNTER — Ambulatory Visit (HOSPITAL_COMMUNITY): Payer: Medicare Other

## 2021-12-21 DIAGNOSIS — M4802 Spinal stenosis, cervical region: Secondary | ICD-10-CM | POA: Diagnosis not present

## 2021-12-21 DIAGNOSIS — D759 Disease of blood and blood-forming organs, unspecified: Secondary | ICD-10-CM | POA: Insufficient documentation

## 2021-12-21 DIAGNOSIS — G4733 Obstructive sleep apnea (adult) (pediatric): Secondary | ICD-10-CM | POA: Diagnosis not present

## 2021-12-21 DIAGNOSIS — M4722 Other spondylosis with radiculopathy, cervical region: Secondary | ICD-10-CM | POA: Insufficient documentation

## 2021-12-21 DIAGNOSIS — G4730 Sleep apnea, unspecified: Secondary | ICD-10-CM | POA: Insufficient documentation

## 2021-12-21 DIAGNOSIS — M4322 Fusion of spine, cervical region: Secondary | ICD-10-CM | POA: Diagnosis not present

## 2021-12-21 DIAGNOSIS — Z87891 Personal history of nicotine dependence: Secondary | ICD-10-CM | POA: Diagnosis not present

## 2021-12-21 DIAGNOSIS — N189 Chronic kidney disease, unspecified: Secondary | ICD-10-CM | POA: Insufficient documentation

## 2021-12-21 DIAGNOSIS — Z9989 Dependence on other enabling machines and devices: Secondary | ICD-10-CM | POA: Diagnosis not present

## 2021-12-21 DIAGNOSIS — M4712 Other spondylosis with myelopathy, cervical region: Secondary | ICD-10-CM | POA: Diagnosis not present

## 2021-12-21 DIAGNOSIS — Z981 Arthrodesis status: Secondary | ICD-10-CM | POA: Diagnosis not present

## 2021-12-21 DIAGNOSIS — Z419 Encounter for procedure for purposes other than remedying health state, unspecified: Secondary | ICD-10-CM

## 2021-12-21 HISTORY — PX: ANTERIOR CERVICAL DECOMP/DISCECTOMY FUSION: SHX1161

## 2021-12-21 SURGERY — ANTERIOR CERVICAL DECOMPRESSION/DISCECTOMY FUSION 1 LEVEL
Anesthesia: General | Site: Spine Cervical

## 2021-12-21 MED ORDER — DEXAMETHASONE SODIUM PHOSPHATE 10 MG/ML IJ SOLN
INTRAMUSCULAR | Status: AC
  Filled 2021-12-21: qty 1

## 2021-12-21 MED ORDER — BISACODYL 10 MG RE SUPP: 10.0000 mg | Freq: Every day | RECTAL | Status: DC | PRN

## 2021-12-21 MED ORDER — ONDANSETRON HCL 4 MG PO TABS: 4.0000 mg | ORAL_TABLET | Freq: Four times a day (QID) | ORAL | Status: DC | PRN

## 2021-12-21 MED ORDER — SUGAMMADEX SODIUM 200 MG/2ML IV SOLN
INTRAVENOUS | Status: DC | PRN
  Administered 2021-12-21: 200 mg via INTRAVENOUS

## 2021-12-21 MED ORDER — BUPIVACAINE-EPINEPHRINE 0.5% -1:200000 IJ SOLN
INTRAMUSCULAR | Status: AC
  Filled 2021-12-21: qty 1

## 2021-12-21 MED ORDER — ACETAMINOPHEN 10 MG/ML IV SOLN
INTRAVENOUS | Status: DC | PRN
Start: 2021-12-21 — End: 2021-12-21
  Administered 2021-12-21: 1000 mg via INTRAVENOUS

## 2021-12-21 MED ORDER — CHLORHEXIDINE GLUCONATE 0.12 % MT SOLN: 15.0000 mL | Freq: Once | OROMUCOSAL | Status: AC

## 2021-12-21 MED ORDER — LACTATED RINGERS IV SOLN: INTRAVENOUS | Status: DC

## 2021-12-21 MED ORDER — CEFAZOLIN SODIUM-DEXTROSE 2-4 GM/100ML-% IV SOLN
2.0000 g | INTRAVENOUS | Status: AC
  Administered 2021-12-21: 2 g via INTRAVENOUS

## 2021-12-21 MED ORDER — PROPOFOL 10 MG/ML IV BOLUS
INTRAVENOUS | Status: DC | PRN
  Administered 2021-12-21: 200 mg via INTRAVENOUS

## 2021-12-21 MED ORDER — BACITRACIN ZINC 500 UNIT/GM EX OINT
TOPICAL_OINTMENT | CUTANEOUS | Status: AC
  Filled 2021-12-21: qty 28.35

## 2021-12-21 MED ORDER — ROCURONIUM BROMIDE 10 MG/ML (PF) SYRINGE
PREFILLED_SYRINGE | INTRAVENOUS | Status: DC | PRN
  Administered 2021-12-21: 10 mg via INTRAVENOUS
  Administered 2021-12-21: 100 mg via INTRAVENOUS

## 2021-12-21 MED ORDER — ACETAMINOPHEN 10 MG/ML IV SOLN
INTRAVENOUS | Status: AC
  Filled 2021-12-21: qty 100

## 2021-12-21 MED ORDER — CHLORHEXIDINE GLUCONATE CLOTH 2 % EX PADS: 6.0000 | MEDICATED_PAD | Freq: Once | CUTANEOUS | Status: DC

## 2021-12-21 MED ORDER — FENTANYL CITRATE (PF) 250 MCG/5ML IJ SOLN
INTRAMUSCULAR | Status: DC | PRN
  Administered 2021-12-21 (×2): 50 ug via INTRAVENOUS
  Administered 2021-12-21: 100 ug via INTRAVENOUS

## 2021-12-21 MED ORDER — BUPIVACAINE-EPINEPHRINE (PF) 0.5% -1:200000 IJ SOLN
INTRAMUSCULAR | Status: DC | PRN
  Administered 2021-12-21: 10 mL

## 2021-12-21 MED ORDER — PANTOPRAZOLE SODIUM 40 MG PO TBEC
40.0000 mg | DELAYED_RELEASE_TABLET | Freq: Every day | ORAL | Status: DC
  Administered 2021-12-21: 40 mg via ORAL
  Filled 2021-12-21: qty 1

## 2021-12-21 MED ORDER — PHENOL 1.4 % MT LIQD: 1.0000 | OROMUCOSAL | Status: DC | PRN

## 2021-12-21 MED ORDER — DEXAMETHASONE 4 MG PO TABS
4.0000 mg | ORAL_TABLET | Freq: Four times a day (QID) | ORAL | Status: AC
  Administered 2021-12-21 – 2021-12-22 (×2): 4 mg via ORAL
  Filled 2021-12-21 (×2): qty 1

## 2021-12-21 MED ORDER — ALUM & MAG HYDROXIDE-SIMETH 200-200-20 MG/5ML PO SUSP: 30.0000 mL | Freq: Four times a day (QID) | ORAL | Status: DC | PRN

## 2021-12-21 MED ORDER — MENTHOL 3 MG MT LOZG
1.0000 | LOZENGE | OROMUCOSAL | Status: DC | PRN
  Filled 2021-12-21: qty 9

## 2021-12-21 MED ORDER — CEFAZOLIN SODIUM-DEXTROSE 2-4 GM/100ML-% IV SOLN
2.0000 g | Freq: Three times a day (TID) | INTRAVENOUS | Status: AC
  Administered 2021-12-21 – 2021-12-22 (×2): 2 g via INTRAVENOUS
  Filled 2021-12-21 (×2): qty 100

## 2021-12-21 MED ORDER — PHENYLEPHRINE HCL-NACL 20-0.9 MG/250ML-% IV SOLN
INTRAVENOUS | Status: DC | PRN
Start: 2021-12-21 — End: 2021-12-21
  Administered 2021-12-21: 20 ug/min via INTRAVENOUS

## 2021-12-21 MED ORDER — APREPITANT 40 MG PO CAPS
ORAL_CAPSULE | ORAL | Status: AC
  Administered 2021-12-21: 40 mg via ORAL
  Filled 2021-12-21: qty 1

## 2021-12-21 MED ORDER — ROCURONIUM BROMIDE 10 MG/ML (PF) SYRINGE
PREFILLED_SYRINGE | INTRAVENOUS | Status: AC
  Filled 2021-12-21: qty 10

## 2021-12-21 MED ORDER — LIDOCAINE 2% (20 MG/ML) 5 ML SYRINGE
INTRAMUSCULAR | Status: DC | PRN
Start: 2021-12-21 — End: 2021-12-21
  Administered 2021-12-21: 100 mg via INTRAVENOUS

## 2021-12-21 MED ORDER — ACETAMINOPHEN 500 MG PO TABS
1000.0000 mg | ORAL_TABLET | Freq: Four times a day (QID) | ORAL | Status: DC
  Administered 2021-12-21 – 2021-12-22 (×2): 1000 mg via ORAL
  Filled 2021-12-21 (×2): qty 2

## 2021-12-21 MED ORDER — PROPOFOL 10 MG/ML IV BOLUS
INTRAVENOUS | Status: AC
  Filled 2021-12-21: qty 20

## 2021-12-21 MED ORDER — ROSUVASTATIN CALCIUM 5 MG PO TABS
10.0000 mg | ORAL_TABLET | Freq: Every day | ORAL | Status: DC
  Administered 2021-12-22: 10 mg via ORAL
  Filled 2021-12-21: qty 2

## 2021-12-21 MED ORDER — CEFAZOLIN SODIUM-DEXTROSE 2-4 GM/100ML-% IV SOLN
INTRAVENOUS | Status: AC
  Filled 2021-12-21: qty 100

## 2021-12-21 MED ORDER — FENTANYL CITRATE (PF) 250 MCG/5ML IJ SOLN
INTRAMUSCULAR | Status: AC
  Filled 2021-12-21: qty 5

## 2021-12-21 MED ORDER — AMISULPRIDE (ANTIEMETIC) 5 MG/2ML IV SOLN: 10.0000 mg | Freq: Once | INTRAVENOUS | Status: DC | PRN

## 2021-12-21 MED ORDER — CHLORHEXIDINE GLUCONATE 0.12 % MT SOLN
OROMUCOSAL | Status: AC
  Administered 2021-12-21: 15 mL via OROMUCOSAL
  Filled 2021-12-21: qty 15

## 2021-12-21 MED ORDER — MORPHINE SULFATE (PF) 4 MG/ML IV SOLN: 4.0000 mg | INTRAVENOUS | Status: DC | PRN

## 2021-12-21 MED ORDER — PHENYLEPHRINE 40 MCG/ML (10ML) SYRINGE FOR IV PUSH (FOR BLOOD PRESSURE SUPPORT)
PREFILLED_SYRINGE | INTRAVENOUS | Status: DC | PRN
Start: 2021-12-21 — End: 2021-12-21
  Administered 2021-12-21: 80 ug via INTRAVENOUS

## 2021-12-21 MED ORDER — DOCUSATE SODIUM 100 MG PO CAPS
100.0000 mg | ORAL_CAPSULE | Freq: Two times a day (BID) | ORAL | Status: DC
  Administered 2021-12-21 – 2021-12-22 (×2): 100 mg via ORAL
  Filled 2021-12-21 (×2): qty 1

## 2021-12-21 MED ORDER — PHENYLEPHRINE 40 MCG/ML (10ML) SYRINGE FOR IV PUSH (FOR BLOOD PRESSURE SUPPORT)
PREFILLED_SYRINGE | INTRAVENOUS | Status: AC
  Filled 2021-12-21: qty 10

## 2021-12-21 MED ORDER — FUROSEMIDE 40 MG PO TABS
40.0000 mg | ORAL_TABLET | ORAL | Status: DC
  Administered 2021-12-21: 40 mg via ORAL
  Filled 2021-12-21: qty 1

## 2021-12-21 MED ORDER — ACETAMINOPHEN 325 MG PO TABS: 650.0000 mg | ORAL_TABLET | ORAL | Status: DC | PRN

## 2021-12-21 MED ORDER — ACETAMINOPHEN 650 MG RE SUPP: 650.0000 mg | RECTAL | Status: DC | PRN

## 2021-12-21 MED ORDER — DEXAMETHASONE SODIUM PHOSPHATE 10 MG/ML IJ SOLN
INTRAMUSCULAR | Status: DC | PRN
  Administered 2021-12-21: 10 mg via INTRAVENOUS

## 2021-12-21 MED ORDER — FENTANYL CITRATE (PF) 100 MCG/2ML IJ SOLN
INTRAMUSCULAR | Status: AC
  Filled 2021-12-21: qty 2

## 2021-12-21 MED ORDER — ONDANSETRON HCL 4 MG/2ML IJ SOLN
INTRAMUSCULAR | Status: DC | PRN
  Administered 2021-12-21: 4 mg via INTRAVENOUS

## 2021-12-21 MED ORDER — THROMBIN 5000 UNITS EX SOLR: OROMUCOSAL | Status: DC | PRN

## 2021-12-21 MED ORDER — OXYCODONE HCL 5 MG PO TABS
10.0000 mg | ORAL_TABLET | ORAL | Status: DC | PRN
  Administered 2021-12-22 (×2): 10 mg via ORAL
  Filled 2021-12-21 (×2): qty 2

## 2021-12-21 MED ORDER — FENTANYL CITRATE (PF) 100 MCG/2ML IJ SOLN
25.0000 ug | INTRAMUSCULAR | Status: DC | PRN
  Administered 2021-12-21: 50 ug via INTRAVENOUS

## 2021-12-21 MED ORDER — ONDANSETRON HCL 4 MG/2ML IJ SOLN
INTRAMUSCULAR | Status: AC
  Filled 2021-12-21: qty 2

## 2021-12-21 MED ORDER — ONDANSETRON HCL 4 MG/2ML IJ SOLN: 4.0000 mg | Freq: Four times a day (QID) | INTRAMUSCULAR | Status: DC | PRN

## 2021-12-21 MED ORDER — THROMBIN 5000 UNITS EX SOLR
CUTANEOUS | Status: AC
  Filled 2021-12-21: qty 5000

## 2021-12-21 MED ORDER — BACITRACIN ZINC 500 UNIT/GM EX OINT
TOPICAL_OINTMENT | CUTANEOUS | Status: DC | PRN
  Administered 2021-12-21: 1 via TOPICAL

## 2021-12-21 MED ORDER — CYCLOBENZAPRINE HCL 10 MG PO TABS: 10.0000 mg | ORAL_TABLET | Freq: Three times a day (TID) | ORAL | Status: DC | PRN

## 2021-12-21 MED ORDER — OXYCODONE HCL 5 MG PO TABS
5.0000 mg | ORAL_TABLET | ORAL | Status: DC | PRN
  Administered 2021-12-21 – 2021-12-22 (×2): 5 mg via ORAL
  Filled 2021-12-21 (×2): qty 1

## 2021-12-21 MED ORDER — SPIRONOLACTONE 25 MG PO TABS
25.0000 mg | ORAL_TABLET | Freq: Every day | ORAL | Status: DC
  Administered 2021-12-22: 25 mg via ORAL
  Filled 2021-12-21: qty 1

## 2021-12-21 MED ORDER — PANTOPRAZOLE SODIUM 40 MG IV SOLR: 40.0000 mg | Freq: Every day | INTRAVENOUS | Status: DC

## 2021-12-21 MED ORDER — DEXAMETHASONE SODIUM PHOSPHATE 4 MG/ML IJ SOLN: 4.0000 mg | Freq: Four times a day (QID) | INTRAMUSCULAR | Status: AC

## 2021-12-21 MED ORDER — APREPITANT 40 MG PO CAPS: 40.0000 mg | ORAL_CAPSULE | Freq: Once | ORAL | Status: AC

## 2021-12-21 MED ORDER — DEXMEDETOMIDINE (PRECEDEX) IN NS 20 MCG/5ML (4 MCG/ML) IV SYRINGE
PREFILLED_SYRINGE | INTRAVENOUS | Status: AC
  Filled 2021-12-21: qty 5

## 2021-12-21 MED ORDER — 0.9 % SODIUM CHLORIDE (POUR BTL) OPTIME
TOPICAL | Status: DC | PRN
  Administered 2021-12-21: 1000 mL

## 2021-12-21 MED ORDER — PROMETHAZINE HCL 25 MG/ML IJ SOLN: 6.2500 mg | INTRAMUSCULAR | Status: DC | PRN

## 2021-12-21 MED ORDER — LIDOCAINE 2% (20 MG/ML) 5 ML SYRINGE
INTRAMUSCULAR | Status: AC
  Filled 2021-12-21: qty 5

## 2021-12-21 MED ORDER — ORAL CARE MOUTH RINSE: 15.0000 mL | Freq: Once | OROMUCOSAL | Status: AC

## 2021-12-21 SURGICAL SUPPLY — 57 items
APL SKNCLS STERI-STRIP NONHPOA (GAUZE/BANDAGES/DRESSINGS) ×1
BAG COUNTER SPONGE SURGICOUNT (BAG) ×2 IMPLANT
BAG SPNG CNTER NS LX DISP (BAG) ×1
BAND INSRT 18 STRL LF DISP RB (MISCELLANEOUS)
BAND RUBBER #18 3X1/16 STRL (MISCELLANEOUS) IMPLANT
BENZOIN TINCTURE PRP APPL 2/3 (GAUZE/BANDAGES/DRESSINGS) ×2 IMPLANT
BIT DRILL NEURO 2X3.1 SFT TUCH (MISCELLANEOUS) ×1 IMPLANT
BLADE SURG 15 STRL LF DISP TIS (BLADE) ×1 IMPLANT
BLADE SURG 15 STRL SS (BLADE) ×2
BLADE ULTRA TIP 2M (BLADE) ×2 IMPLANT
BUR BARREL STRAIGHT FLUTE 4.0 (BURR) ×2 IMPLANT
BUR MATCHSTICK NEURO 3.0 LAGG (BURR) ×2 IMPLANT
CANISTER SUCT 3000ML PPV (MISCELLANEOUS) ×2 IMPLANT
CARTRIDGE OIL MAESTRO DRILL (MISCELLANEOUS) ×1 IMPLANT
COVER MAYO STAND STRL (DRAPES) ×2 IMPLANT
DECANTER SPIKE VIAL GLASS SM (MISCELLANEOUS) ×2 IMPLANT
DIFFUSER DRILL AIR PNEUMATIC (MISCELLANEOUS) ×2 IMPLANT
DRAPE LAPAROTOMY 100X72 PEDS (DRAPES) ×2 IMPLANT
DRAPE MICROSCOPE LEICA (MISCELLANEOUS) IMPLANT
DRAPE SURG 17X23 STRL (DRAPES) ×4 IMPLANT
DRILL NEURO 2X3.1 SOFT TOUCH (MISCELLANEOUS) ×2
DRSG OPSITE POSTOP 3X4 (GAUZE/BANDAGES/DRESSINGS) ×2 IMPLANT
DRSG OPSITE POSTOP 4X6 (GAUZE/BANDAGES/DRESSINGS) ×1 IMPLANT
ELECT REM PT RETURN 9FT ADLT (ELECTROSURGICAL) ×2
ELECTRODE REM PT RTRN 9FT ADLT (ELECTROSURGICAL) ×1 IMPLANT
GAUZE 4X4 16PLY ~~LOC~~+RFID DBL (SPONGE) IMPLANT
GLOVE EXAM NITRILE XL STR (GLOVE) IMPLANT
GLOVE SURG ENC MOIS LTX SZ8 (GLOVE) ×2 IMPLANT
GLOVE SURG ENC MOIS LTX SZ8.5 (GLOVE) ×2 IMPLANT
GOWN STRL REUS W/ TWL LRG LVL3 (GOWN DISPOSABLE) IMPLANT
GOWN STRL REUS W/ TWL XL LVL3 (GOWN DISPOSABLE) ×1 IMPLANT
GOWN STRL REUS W/TWL LRG LVL3 (GOWN DISPOSABLE)
GOWN STRL REUS W/TWL XL LVL3 (GOWN DISPOSABLE) ×2
HEMOSTAT POWDER KIT SURGIFOAM (HEMOSTASIS) ×2 IMPLANT
KIT BASIN OR (CUSTOM PROCEDURE TRAY) ×2 IMPLANT
KIT TURNOVER KIT B (KITS) ×2 IMPLANT
MARKER SKIN DUAL TIP RULER LAB (MISCELLANEOUS) ×2 IMPLANT
NDL SPNL 18GX3.5 QUINCKE PK (NEEDLE) ×1 IMPLANT
NEEDLE HYPO 22GX1.5 SAFETY (NEEDLE) ×2 IMPLANT
NEEDLE SPNL 18GX3.5 QUINCKE PK (NEEDLE) ×2 IMPLANT
NS IRRIG 1000ML POUR BTL (IV SOLUTION) ×2 IMPLANT
OIL CARTRIDGE MAESTRO DRILL (MISCELLANEOUS) ×2
PACK LAMINECTOMY NEURO (CUSTOM PROCEDURE TRAY) ×2 IMPLANT
PEEK VISTA 14X14X7MM (Peek) ×1 IMPLANT
PIN DISTRACTION 14MM (PIN) ×4 IMPLANT
PLATE ANT CERV XTEND 1 LV 14 (Plate) ×1 IMPLANT
PUTTY DBM 2CC CALC GRAN (Putty) ×1 IMPLANT
SCREW XTD VAR 4.2 SELF TAP (Screw) ×4 IMPLANT
SPONGE INTESTINAL PEANUT (DISPOSABLE) ×4 IMPLANT
SPONGE SURGIFOAM ABS GEL SZ50 (HEMOSTASIS) IMPLANT
STRIP CLOSURE SKIN 1/2X4 (GAUZE/BANDAGES/DRESSINGS) ×2 IMPLANT
SUT VIC AB 0 CT1 27 (SUTURE) ×2
SUT VIC AB 0 CT1 27XBRD ANTBC (SUTURE) ×1 IMPLANT
SUT VIC AB 3-0 SH 8-18 (SUTURE) ×2 IMPLANT
TOWEL GREEN STERILE (TOWEL DISPOSABLE) ×2 IMPLANT
TOWEL GREEN STERILE FF (TOWEL DISPOSABLE) ×2 IMPLANT
WATER STERILE IRR 1000ML POUR (IV SOLUTION) ×2 IMPLANT

## 2021-12-21 NOTE — Anesthesia Postprocedure Evaluation (Signed)
Anesthesia Post Note  Patient: Brent Allison  Procedure(s) Performed: CERVICAL FIVE-SIX ANTERIOR CERVICAL DECOMPRESSION/DISCECTOMY FUSION (Spine Cervical)     Patient location during evaluation: PACU Anesthesia Type: General Level of consciousness: sedated Pain management: pain level controlled Vital Signs Assessment: post-procedure vital signs reviewed and stable Respiratory status: spontaneous breathing and respiratory function stable Cardiovascular status: stable Postop Assessment: no apparent nausea or vomiting Anesthetic complications: no   No notable events documented.  Last Vitals:  Vitals:   12/21/21 1540 12/21/21 1555  BP: 116/67 111/66  Pulse: 74 76  Resp: 12 15  Temp:    SpO2: 100% 100%    Last Pain:  Vitals:   12/21/21 1555  TempSrc:   PainSc: Asleep                 Deara Bober DANIEL

## 2021-12-21 NOTE — Transfer of Care (Signed)
Immediate Anesthesia Transfer of Care Note  Patient: Brent Allison  Procedure(s) Performed: CERVICAL FIVE-SIX ANTERIOR CERVICAL DECOMPRESSION/DISCECTOMY FUSION (Spine Cervical)  Patient Location: PACU  Anesthesia Type:General  Level of Consciousness: drowsy  Airway & Oxygen Therapy: Patient Spontanous Breathing and Patient connected to face mask oxygen  Post-op Assessment: Report given to RN, Post -op Vital signs reviewed and stable and Patient moving all extremities X 4  Post vital signs: Reviewed and stable  Last Vitals:  Vitals Value Taken Time  BP 122/60 12/21/21 1524  Temp    Pulse 75 12/21/21 1525  Resp 18 12/21/21 1525  SpO2 100 % 12/21/21 1525  Vitals shown include unvalidated device data.  Last Pain:  Vitals:   12/21/21 1025  TempSrc:   PainSc: 0-No pain         Complications: No notable events documented.

## 2021-12-21 NOTE — H&P (Signed)
Subjective:  The patient is a 76 year old white male who has had multiple previous neck surgeries by other physicians.  He has developed recurrent neck pain with unsteady gait consistent with a cervical myelopathy.  He was worked up with a cervical MRI which demonstrated spondylosis and stenosis at C5-6.  I discussed the various treatment options with him.  He has decided proceed with surgery.  Past Medical History:  Diagnosis Date   AL amyloidosis ([place]) 11/04/2012   Arthritis    lumbar DDD   Blood dyscrasia    plasma cell dyscrasia with associated amyloidosis   CKD (chronic kidney disease)    DVT (deep venous thrombosis) (HCC)    right   Dyslipidemia    H/O multiple myeloma    Heparin induced thrombocytopenia (HIT) 11/04/2012   Joint pain    Peripheral vascular disease ([place])    DVT- 01/2012, follows by Dr. [name], lovenox maintained since Spring 2013 , pt. aware that last dose is sch. for 07/22/2012   Pneumonia    hx of with last time in Nov 2012   Prostate cancer [facility])    amyloidosis, multiple myeloma    Sleep apnea    severe with AHI 44.4 events per hour - now on CPAP at 11cm H2O   Spastic quadriparesis [facility])     Past Surgical History:  Procedure Laterality Date   ANTERIOR CERVICAL DECOMP/DISCECTOMY FUSION  02/26/2012   Procedure: ANTERIOR CERVICAL DECOMPRESSION/DISCECTOMY FUSION 2 LEVELS;  Surgeon: Hosie Spangle, MD;  Location: [place] NEURO ORS;  Service: Neurosurgery;  Laterality: N/A;  C3-4 C4-5 Anterior cervical decompression/diskectomy, fusion   BACK SURGERY     CARDIAC CATHETERIZATION  2010   CARPAL TUNNEL RELEASE  2009   left   COLONOSCOPY     HERNIA REPAIR     around 1985   ivt     IVC filter placed d/t blood clots, removed- July- 2013   LIMBAL STEM CELL TRANSPLANT     LIPOMA EXCISION  around [place]   removed from one of his shoulders   POSTERIOR CERVICAL FUSION/FORAMINOTOMY  04/06/2012   Procedure: POSTERIOR CERVICAL FUSION/FORAMINOTOMY LEVEL 3;  Surgeon: Hosie Spangle, MD;  Location: [facility];  Service: Neurosurgery;  Laterality: N/A;  C3 and C4 laminectomy with C23 C34 C45 posterior cervical arthrodesis with instrumentation   POSTERIOR CERVICAL FUSION/FORAMINOTOMY N/A 06/02/2015   Procedure: Cervical Seven, Thoracic One, Thoracic Two Laminectomies with Cervical Seven-Thoracic One, Thoracic One-Thoracic Two Posterior Cervico-Thoracic  Arthrodesis with Instrumentation;  Surgeon: Jovita Gamma, MD;  Location: [place] NEURO ORS;  Service: Neurosurgery;  Laterality: N/A;  C7, T1, T2 laminectomies with C7-T2 cerevico thoracic arthrodesis with instrumentation   POSTERIOR LAMINECTOMY / DECOMPRESSION LUMBAR SPINE  around 2007   L4-5   PROSTATECTOMY  around 2005   TONSILLECTOMY     as a child    Allergies  Allergen Reactions   Enoxaparin Other (See Comments)    Blood clots HIT [SRA +]   Heparin Other (See Comments)    Heparin induced thrombocytopenia (SRA +)    Social History   Tobacco Use   Smoking status: Former    Packs/day: 0.50    Years: 10.00    Pack years: 5.00    Types: Cigarettes    Quit date: 07/21/1979    Years since quitting: 42.4   Smokeless tobacco: Never   Tobacco comments:    quit 25+yrs ago  Substance Use Topics   Alcohol use: Yes    Alcohol/week: 2.0 standard drinks  Types: 2 Cans of beer per week    Comment: occasionally    Family History  Problem Relation Age of Onset   Pancreatic cancer Mother    Anesthesia problems Neg Hx    Hypotension Neg Hx    Malignant hyperthermia Neg Hx    Pseudochol deficiency Neg Hx    Prior to Admission medications   Medication Sig Start Date End Date Taking? Authorizing Provider  Cyanocobalamin (VITAMIN B 12 PO) Take 1,000 mg by mouth at bedtime.   Yes [provider]  diclofenac (VOLTAREN) 75 MG EC tablet Take 75 mg by mouth 2 (two) times daily as needed for moderate pain. 09/12/20  Yes [provider]  diphenhydrAMINE (BENADRYL) 25 MG tablet Take 25 mg by mouth at  bedtime as needed for sleep.   Yes [provider]  doxycycline (VIBRA-TABS) 100 MG tablet Take 100 mg by mouth daily. 03/11/20  Yes [provider]  furosemide (LASIX) 20 MG tablet Take 40 mg by mouth every other day. 06/17/17  Yes [provider]  rosuvastatin (CRESTOR) 10 MG tablet Take 10 mg by mouth daily.   Yes [provider]  spironolactone (ALDACTONE) 25 MG tablet Take 25 mg by mouth daily. 05/28/19   [provider]     Review of Systems  Positive ROS: As above  All other systems have been reviewed and were otherwise negative with the exception of those mentioned in the HPI and as above.  Objective: Vital signs in last 24 hours: Temp:  [97.4 F (36.3 C)] 97.4 F (36.3 C) (01/30 0948) Pulse Rate:  [73] 73 (01/30 0948) Resp:  [18] 18 (01/30 0948) BP: (133)/(69) 133/69 (01/30 0948) SpO2:  [100 %] 100 % (01/30 0948) Weight:  [90.5 kg] 90.5 kg (01/30 0948) Estimated body mass index is 27.06 kg/m as calculated from the following:   Height as of this encounter: 6' (1.829 m).   Weight as of this encounter: 90.5 kg.   General Appearance: Alert Head: Normocephalic, without obvious abnormality, atraumatic Eyes: PERRL, conjunctiva/corneas clear, EOM's intact,    Ears: Normal  Throat: Normal  Neck: The patient's anterior and posterior cervical incisions are well-healed.  He has limited cervical range of motion. Back: unremarkable Lungs: Clear to auscultation bilaterally, respirations unlabored Heart: Regular rate and rhythm, no murmur, rub or gallop Abdomen: Soft, non-tender Extremities: Extremities normal, atraumatic, no cyanosis or edema Skin: unremarkable  NEUROLOGIC:   Mental status: alert and oriented,Motor Exam - grossly normal Sensory Exam - grossly normal Reflexes:  Coordination - grossly normal Gait - grossly normal Balance - grossly normal Cranial Nerves: I: smell Not tested  II: visual acuity  OS: Normal  OD: Normal    II: visual fields Full to confrontation  II: pupils Equal, round, reactive to light  III,VII: ptosis None  III,IV,VI: extraocular muscles  Full ROM  V: mastication Normal  V: facial light touch sensation  Normal  V,VII: corneal reflex  Present  VII: facial muscle function - upper  Normal  VII: facial muscle function - lower Normal  VIII: hearing Not tested  IX: soft palate elevation  Normal  IX,X: gag reflex Present  XI: trapezius strength  5/5  XI: sternocleidomastoid strength 5/5  XI: neck flexion strength  5/5  XII: tongue strength  Normal    Data Review Lab Results  Component Value Date   WBC 8.4 12/15/2021   HGB 13.2 12/15/2021   HCT 40.3 12/15/2021   MCV 101.3 (H) 12/15/2021   PLT  181 12/15/2021   Lab Results  Component Value Date   NA 140 12/15/2021   K 3.9 12/15/2021   CL 107 12/15/2021   CO2 26 12/15/2021   BUN 16 12/15/2021   CREATININE 1.40 (H) 12/15/2021   GLUCOSE 95 12/15/2021   Lab Results  Component Value Date   INR 1.20 (L) 03/13/2013   PROTIME 14.4 (H) 03/13/2013    Assessment/Plan: C5-6 disc degeneration, spondylosis, stenosis, cervicalgia, cervical myelopathy: I have discussed the situation with the patient.  I reviewed his MRI scan with him and pointed out the abnormalities.  We have discussed the various treatment options including surgery.  I have described the surgical treatment option of a C5-6 anterior cervical discectomy, fusion and plating.  I have shown him surgical models.  I have given him a surgical pamphlet.  We have discussed the risk, benefits, alternatives, expected postop course, and likelihood of achieving our goals with surgery.  I have answered all his questions.  He has decided proceed with surgery.   Ophelia Charter 12/21/2021 11:47 AM

## 2021-12-21 NOTE — Progress Notes (Signed)
Orthopedic Tech Progress Note Patient Details:  Brent Allison 1946-02-24 [id number]  Called in order to [facility] for an [place]   Patient ID: Brent Allison, male   DOB: 29-Nov-1945, 76 y.o.   MRN: [id number]  Brent Allison 12/21/2021, 5:34 PM

## 2021-12-21 NOTE — Anesthesia Procedure Notes (Signed)
Procedure Name: Intubation Date/Time: 12/21/2021 12:47 PM Performed by: Reece Agar, CRNA Pre-anesthesia Checklist: Patient identified, Emergency Drugs available, Suction available and Patient being monitored Patient Re-evaluated:Patient Re-evaluated prior to induction Oxygen Delivery Method: Circle System Utilized Preoxygenation: Pre-oxygenation with 100% oxygen Induction Type: IV induction Ventilation: Mask ventilation without difficulty and Oral airway inserted - appropriate to patient size Laryngoscope Size: Glidescope and 4 Grade View: Grade I Tube type: Oral Tube size: 7.5 mm Number of attempts: 1 Airway Equipment and Method: Video-laryngoscopy, Rigid stylet and Oral airway Placement Confirmation: ETT inserted through vocal cords under direct vision, positive ETCO2 and breath sounds checked- equal and bilateral Secured at: 23 cm Tube secured with: Tape Dental Injury: Teeth and Oropharynx as per pre-operative assessment

## 2021-12-21 NOTE — Op Note (Signed)
Brief history: The patient is a 76 year old white male whose had multiple previous cervical surgeries by other physicians.  He has developed recurrent neck pain with unsteady gait.  He was worked up with a cervical MRI which demonstrated spondylosis and stenosis most prominent at C5-6.  I discussed the various treatment options with him.  He has decided proceed with surgery.  Preoperative diagnosis: C5-6 spondylosis, stenosis, cervicalgia, cervical radiculopathy, cervical myelopathy  Postoperative diagnosis: The same  Procedure: C5-6 anterior cervical discectomy/decompression; C5-6 interbody arthrodesis with local morcellized autograft bone and Zimmer DBM; insertion of interbody prosthesis at C5-6 (Zimmer peek interbody prosthesis); anterior cervical plating from C5-6 with globus titanium plate  Surgeon: Dr. Earle Gell  Asst.: Arnetha Massy, NP  Anesthesia: Gen. endotracheal  Estimated blood loss: 75 cc  Drains: None  Complications: None  Description of procedure: The patient was brought to the operating room by the anesthesia team. General endotracheal anesthesia was induced. A roll was placed under the patient's shoulders to keep the neck in the neutral position. The patient's anterior cervical region was then prepared with Betadine scrub and Betadine solution. Sterile drapes were applied.  The area to be incised was then injected with Marcaine with epinephrine solution. I then used a scalpel to make a transverse incision in the patient's left anterior neck. I used the Metzenbaum scissors to dissect through the scar tissue from his previous operations divide the platysmal muscle and then to dissect medial to the sternocleidomastoid muscle, jugular vein, and carotid artery. I carefully dissected down towards the anterior cervical spine identifying the esophagus and retracting it medially. Then using Kitner swabs to clear soft tissue from the anterior cervical spine. We then inserted a bent  spinal needle into the upper exposed intervertebral disc space. We then obtained intraoperative radiographs confirm our location.  I then used electrocautery to detach the medial border of the longus colli muscle bilaterally from the C5-6 intervertebral disc spaces. I then inserted the Caspar self-retaining retractor underneath the longus colli muscle bilaterally to provide exposure.  We then incised the intervertebral disc at C5-6. We then performed a partial intervertebral discectomy with a pituitary forceps and the Karlin curettes. I then inserted distraction screws into the vertebral bodies at C5 and C6. We then distracted the interspace. We then used the high-speed drill to decorticate the vertebral endplates at T6-5, to drill away the remainder of the intervertebral disc, to drill away some posterior spondylosis, and to thin out the posterior longitudinal ligament. I then incised ligament with the arachnoid knife. We then removed the ligament with a Kerrison punches undercutting the vertebral endplates and decompressing the thecal sac. We then performed foraminotomies about the bilateral C6 nerve roots. This completed the decompression at this level.  We now turned our to attention to the interbody fusion. We used the trial spacers to determine the appropriate size for the interbody prosthesis. We then pre-filled prosthesis with a combination of local morcellized autograft bone that we obtained during decompression as well as Zimmer DBM. We then inserted the prosthesis into the distracted interspace at C5-6. We then removed the distraction screws. There was a good snug fit of the prosthesis in the interspace.  Having completed the fusion we now turned attention to the anterior spinal instrumentation. We used the high-speed drill to drill away some anterior spondylosis at the disc spaces so that the plate lay down flat. We selected the appropriate length titanium anterior cervical plate. We laid it  along the anterior aspect of the  vertebral bodies from C5-6. We then drilled 14 mm holes at C5 and C6. We then secured the plate to the vertebral bodies by placing two 14 mm self-tapping screws at C5 and C6. We then obtained intraoperative radiograph. The demonstrating good position of the instrumentation. We therefore secured the screws the plate the locking each cam. This completed the instrumentation.  We then obtained hemostasis using bipolar electrocautery. We irrigated the wound out with bacitracin solution. We then removed the retractor. We inspected the esophagus for any damage. There was none apparent. We then reapproximated patient's platysmal muscle with interrupted 3-0 Vicryl suture. We then reapproximated the subcutaneous tissue with interrupted 3-0 Vicryl suture. The skin was reapproximated with Steri-Strips and benzoin. The wound was then covered with bacitracin ointment. A sterile dressing was applied. The drapes were removed. Patient was subsequently extubated by the anesthesia team and transported to the post anesthesia care unit in stable condition. All sponge instrument and needle counts were reportedly correct at the end of this case.

## 2021-12-22 ENCOUNTER — Encounter (HOSPITAL_COMMUNITY): Payer: Self-pay | Admitting: Neurosurgery

## 2021-12-22 DIAGNOSIS — N189 Chronic kidney disease, unspecified: Secondary | ICD-10-CM | POA: Diagnosis not present

## 2021-12-22 DIAGNOSIS — G4730 Sleep apnea, unspecified: Secondary | ICD-10-CM | POA: Diagnosis not present

## 2021-12-22 DIAGNOSIS — M4712 Other spondylosis with myelopathy, cervical region: Secondary | ICD-10-CM | POA: Diagnosis not present

## 2021-12-22 DIAGNOSIS — Z87891 Personal history of nicotine dependence: Secondary | ICD-10-CM | POA: Diagnosis not present

## 2021-12-22 DIAGNOSIS — M4722 Other spondylosis with radiculopathy, cervical region: Secondary | ICD-10-CM | POA: Diagnosis not present

## 2021-12-22 DIAGNOSIS — M4802 Spinal stenosis, cervical region: Secondary | ICD-10-CM | POA: Diagnosis not present

## 2021-12-22 MED ORDER — OXYCODONE-ACETAMINOPHEN 10-325 MG PO TABS: 1.0000 | ORAL_TABLET | Freq: Four times a day (QID) | ORAL | 0 refills | Status: DC | PRN

## 2021-12-22 MED ORDER — CYCLOBENZAPRINE HCL 10 MG PO TABS: 10.0000 mg | ORAL_TABLET | Freq: Three times a day (TID) | ORAL | 0 refills | Status: DC | PRN

## 2021-12-22 MED ORDER — OXYCODONE-ACETAMINOPHEN 10-325 MG PO TABS
1.0000 | ORAL_TABLET | ORAL | 0 refills | Status: DC | PRN
Start: 2021-12-22 — End: 2022-02-05

## 2021-12-22 MED ORDER — DOCUSATE SODIUM 100 MG PO CAPS: 100.0000 mg | ORAL_CAPSULE | Freq: Two times a day (BID) | ORAL | 0 refills | Status: DC

## 2021-12-22 NOTE — Progress Notes (Signed)
Patient alert and oriented, mae's well, voiding adequate amount of urine, swallowing without difficulty, no c/o pain at time of discharge. Patient discharged home with family. Script and discharged instructions given to patient. Patient and family stated understanding of instructions given. Patient has an appointment with Dr. Jenkins   

## 2021-12-22 NOTE — Discharge Instructions (Addendum)
Wound Care Keep incision covered and dry for three days.    Do not put any creams, lotions, or ointments on incision. Leave steri-strips on neck.  They will fall off by themselves.  Activity Walk each and every day, increasing distance each day. No lifting greater than 5 lbs.  Avoid excessive neck motion. No driving for 2 weeks; may ride as a passenger locally.  Diet Resume your normal diet.    Call Your Doctor If Any of These Occur Redness, drainage, or swelling at the wound.  Temperature greater than 101 degrees. Severe pain not relieved by pain medication. Incision starts to come apart.

## 2021-12-22 NOTE — TOC Progression Note (Signed)
Transition of Care [facility]) - Progression Note    Patient Details  Name: LEVERT HESLOP MRN: [id number] Date of Birth: 12-14-1945  Transition of Care [facility]) CM/SW Contact  Melessia Kaus, Edson Snowball, RN Phone Number: 12/22/2021, 8:26 AM  Clinical Narrative:     Dr Arnoldo Morale office has arranged home health through [place] , per Thayer Headings with [facility] no home health orders needed.   3C staff will provide any needed DME.    Transition of Care [facility]) Screening Note   Patient Details  Name: KELTIN BAIRD III Date of Birth: 10-13-1946   Transition of Care Department [facility]) has reviewed patient and no TOC needs have been identified at this time. We will continue to monitor patient advancement through interdisciplinary progression rounds. If new patient transition needs arise, please place a TOC consult.         Expected Discharge Plan and Services                                                 Social Determinants of Health (SDOH) Interventions    Readmission Risk Interventions No flowsheet data found.

## 2021-12-22 NOTE — Discharge Summary (Signed)
Physician Discharge Summary     Providing Compassionate, Quality Care - Together   Patient ID: Brent Allison MRN: [id number] DOB/AGE: 1946-05-31 76 y.o.  Admit date: 12/21/2021 Discharge date: 12/22/2021  Admission Diagnoses: Cervical spondylosis with myelopathy and radiculopathy  Discharge Diagnoses:  Principal Problem:   Cervical spondylosis with myelopathy and radiculopathy   Discharged Condition: good  Hospital Course: Patient underwent a C5-6 ACDF by Dr. Arnoldo Morale on 12/21/2021. He was admitted to 3C05 following recovery from anesthesia in the PACU. His postoperative course has been uncomplicated. He has worked with occupational therapy who feel the patient is ready for discharge home. He is ambulating independently and without difficulty. He is tolerating a normal diet. He is not having any bowel or bladder dysfunction. His pain is well-controlled with oral pain medication. He is ready for discharge home.   Consults: OT  Significant Diagnostic Studies: radiology: DG Cervical Spine 2 or 3 views  Result Date: 12/21/2021 CLINICAL DATA:  ACDF C5-6 EXAM: CERVICAL SPINE - 2-3 VIEW COMPARISON:  11/06/2021 cervical spine radiographs FINDINGS: Cross-table lateral cervical spine radiographs demonstrate previous surgical hardware from ACDF C3-5 and from bilateral posterior spinal fusion hardware C2-5 and separate bilateral posterior spinal fusion hardware extending inferiorly from C7 level. Initial radiograph demonstrates anterior approach surgical marking device terminating over the C5-6 prevertebral soft tissues. Second radiograph demonstrates new surgical hardware from ACDF C5-6, which appears grossly well-positioned with no evidence of hardware fracture or loosening. Oral route tube enters trachea with tip not seen on these views. IMPRESSION: 1. Anterior approach surgical marking device terminates over the C5-6 prevertebral soft tissues on the initial radiograph. 2. Expected  postsurgical changes from interval ACDF C5-6 on the second radiograph. Electronically Signed   By: Ilona Sorrel M.D.   On: 12/21/2021 15:45     Treatments: surgery: C5-6 anterior cervical discectomy/decompression; C5-6 interbody arthrodesis with local morcellized autograft bone and Zimmer DBM; insertion of interbody prosthesis at C5-6 (Zimmer peek interbody prosthesis); anterior cervical plating from C5-6 with globus titanium plate  Discharge Exam: Blood pressure 99/64, pulse 62, temperature (!) 97.5 F (36.4 C), resp. rate 17, height 6' (1.829 m), weight 90.5 kg, SpO2 100 %.  Alert and oriented x 4 PERRLA CN II-XII grossly intact MAE, Strength and sensation intact Incision is covered with Honeycomb dressing and Steri Strips; Dressing is clean, dry, and intact   Disposition: Discharge disposition: 01-Home or Self Care        Allergies as of 12/22/2021       Reactions   Enoxaparin Other (See Comments)   Blood clots HIT [SRA +]   Heparin Other (See Comments)   Heparin induced thrombocytopenia (SRA +)        Medication List     STOP taking these medications    diclofenac 75 MG EC tablet Commonly known as: VOLTAREN       TAKE these medications    cyclobenzaprine 10 MG tablet Commonly known as: FLEXERIL Take 1 tablet (10 mg total) by mouth 3 (three) times daily as needed for muscle spasms.   diphenhydrAMINE 25 MG tablet Commonly known as: BENADRYL Take 25 mg by mouth at bedtime as needed for sleep.   docusate sodium 100 MG capsule Commonly known as: COLACE Take 1 capsule (100 mg total) by mouth 2 (two) times daily.   doxycycline 100 MG tablet Commonly known as: VIBRA-TABS Take 100 mg by mouth daily.   furosemide 20 MG tablet Commonly known as: LASIX Take 40 mg by mouth every  other day.   oxyCODONE-acetaminophen 10-325 MG tablet Commonly known as: Percocet Take 1 tablet by mouth every 4 (four) hours as needed for pain.   oxyCODONE-acetaminophen 10-325  MG tablet Commonly known as: Percocet Take 1 tablet by mouth every 6 (six) hours as needed for pain.   rosuvastatin 10 MG tablet Commonly known as: CRESTOR Take 10 mg by mouth daily.   spironolactone 25 MG tablet Commonly known as: ALDACTONE Take 25 mg by mouth daily.   VITAMIN B 12 PO Take 1,000 mg by mouth at bedtime.        Follow-up Information     [facility] Follow up.   Specialty: Home Health Services Why: [place] information: [place] [address] [phone]         Newman Pies, MD. Go on 01/12/2022.   Specialty: Neurosurgery Why: First post op appointment with x-rays is 01/12/2022 at 8:15 am. Contact information: [address] [place] [address] [phone]                 Signed: Viona Gilmore, DNP, AGNP-C Nurse Practitioner  [facility] [place] [address], [address], [place], [place] [address] P: [phone]     F: [phone]  12/22/2021, 11:21 AM

## 2021-12-22 NOTE — Evaluation (Signed)
Occupational Therapy Evaluation Patient Details Name: DUVALL COMES MRN: [id number] DOB: 1945/12/01 Today's Date: 12/22/2021   History of Present Illness Gayland Curry III "Rush [facility]mark" is a 76 y.o male who underwent  cervical 5-6 anterior cervical decompression disectomy fusion. PMHx: AL amyloidosis, arthritis, CKD, DVT, dyslipidemia, PVD   Clinical Impression   Bill reports being indep with all ADLs and mobility at baseline, no AD. He lives in a 2 level home (bed/bath on 2nd level) with his wife and daughter who have dementia, and Rush Landmark is their PCG. After review, pt verbalized great understanding of his cervical precautions and compensatory techniques to maintain. Demonstrated great management of cervical collar. Overall he was supervision for all ADLs and functional mobility without AD. Pt ambulated beyond a household distance and managed 7 steps well with use of hand rail. Reviewed exercise/walking recommendation and use of RW with community mobility initially. Pt does not have further OT needs actuely, or at d/c. Recommend home with support of his daughters who live near by.      Recommendations for follow up therapy are one component of a multi-disciplinary discharge planning process, led by the attending physician.  Recommendations may be updated based on patient status, additional functional criteria and insurance authorization.   Follow Up Recommendations  No OT follow up    Assistance Recommended at Discharge Intermittent Supervision/Assistance  Patient can return home with the following A little help with walking and/or transfers;A little help with bathing/dressing/bathroom;Assist for transportation    Functional Status Assessment  Patient has had a recent decline in their functional status and demonstrates the ability to make significant improvements in function in a reasonable and predictable amount of time.  Equipment Recommendations  None recommended by OT     Recommendations for Other Services       Precautions / Restrictions Precautions Precautions: Fall;Cervical Precaution Booklet Issued: Yes (comment) Precaution Comments: reviewed during functional activity Required Braces or Orthoses: Cervical Brace Cervical Brace: Hard collar Restrictions Weight Bearing Restrictions: Yes      Mobility Bed Mobility Overal bed mobility: Needs Assistance Bed Mobility: Rolling, Sidelying to Sit, Sit to Sidelying Rolling: Supervision Sidelying to sit: Supervision     Sit to sidelying: Supervision General bed mobility comments: verbal cues for log roll    Transfers Overall transfer level: Needs assistance Equipment used: None Transfers: Sit to/from Stand Sit to Stand: Supervision                  Balance Overall balance assessment: Needs assistance Sitting-balance support: Feet supported Sitting balance-Leahy Scale: Good     Standing balance support: No upper extremity supported, During functional activity Standing balance-Leahy Scale: Fair                             ADL either performed or assessed with clinical judgement   ADL Overall ADL's : Needs assistance/impaired                 Functional mobility during ADLs: Supervision/safety General ADL Comments: pt was supervision for all ADLs and functional mobility with minimal verbal cues to maintain back precautions and to use compensatory techniques. No AD used. Demonstrated good don/doff of cervical collar.     Vision Baseline Vision/History: 0 No visual deficits;1 Wears glasses Ability to See in Adequate Light: 0 Adequate Vision Assessment?: No apparent visual deficits            Pertinent Vitals/Pain Pain Assessment Pain  Assessment: Faces Faces Pain Scale: Hurts a little bit Pain Location: neck Pain Descriptors / Indicators: Discomfort, Grimacing Pain Intervention(s): Limited activity within patient's tolerance, Monitored during session      Hand Dominance Right   Extremity/Trunk Assessment Upper Extremity Assessment Upper Extremity Assessment: Overall WFL for tasks assessed   Lower Extremity Assessment Lower Extremity Assessment: Overall WFL for tasks assessed   Cervical / Trunk Assessment Cervical / Trunk Assessment: Neck Surgery   Communication Communication Communication: No difficulties   Cognition Arousal/Alertness: Awake/alert Behavior During Therapy: WFL for tasks assessed/performed Overall Cognitive Status: Within Functional Limits for tasks assessed           General Comments: verbalized great understanding o fcervical precautions     General Comments  VSS on RA            Home Living Family/patient expects to be discharged to:: Private residence Living Arrangements: Spouse/significant other;Children Available Help at Discharge: Family;Available 24 hours/day Type of Home: House Home Access: Stairs to enter [employer] of Steps: 3 in the front, 5 in the back. Back has rail, front does not   Home Layout: Two level;Bed/bath upstairs Alternate Level Stairs-Number of Steps: flight with landing half way   Bathroom Shower/Tub: Walk-in shower   Bathroom Toilet: Handicapped height     Home Equipment: Conservation officer, nature (2 wheels);Shower seat   Additional Comments: pt's wife and daughter have dementia, he is their caregiver. He has two other daughters who do not live in the home who plan to come assist as needed.      Prior Functioning/Environment Prior Level of Function : Independent/Modified Independent;Driving             Mobility Comments: no AD ADLs Comments: indep, caregiver to his wife and daughter        OT Problem List: Decreased strength;Decreased range of motion;Decreased activity tolerance;Impaired balance (sitting and/or standing);Decreased knowledge of precautions;Pain         OT Goals(Current goals can be found in the care plan section) Acute Rehab OT  Goals Patient Stated Goal: home OT Goal Formulation: All assessment and education complete, DC therapy Time For Goal Achievement: 12/22/21   AM-PAC OT "6 Clicks" Daily Activity     Outcome Measure Help from another person eating meals?: None Help from another person taking care of personal grooming?: A Little Help from another person toileting, which includes using toliet, bedpan, or urinal?: A Little Help from another person bathing (including washing, rinsing, drying)?: A Little Help from another person to put on and taking off regular upper body clothing?: A Little Help from another person to put on and taking off regular lower body clothing?: A Little 6 Click Score: 19   End of Session Equipment Utilized During Treatment: Cervical collar Nurse Communication: Mobility status  Activity Tolerance: Patient tolerated treatment well Patient left: in bed;with call bell/phone within reach  OT Visit Diagnosis: Unsteadiness on feet (R26.81);Muscle weakness (generalized) (M62.81);Pain                Time: 4888-9169 OT Time Calculation (min): 22 min Charges:  OT General Charges $OT Visit: 1 Visit OT Evaluation $OT Eval Moderate Complexity: 1 Mod  [place] A Patriece Archbold 12/22/2021, 10:24 AM

## 2021-12-23 DIAGNOSIS — K5900 Constipation, unspecified: Secondary | ICD-10-CM | POA: Diagnosis not present

## 2021-12-23 DIAGNOSIS — Z9989 Dependence on other enabling machines and devices: Secondary | ICD-10-CM | POA: Diagnosis not present

## 2021-12-23 DIAGNOSIS — Z792 Long term (current) use of antibiotics: Secondary | ICD-10-CM | POA: Diagnosis not present

## 2021-12-23 DIAGNOSIS — E785 Hyperlipidemia, unspecified: Secondary | ICD-10-CM | POA: Diagnosis not present

## 2021-12-23 DIAGNOSIS — I509 Heart failure, unspecified: Secondary | ICD-10-CM | POA: Diagnosis not present

## 2021-12-23 DIAGNOSIS — Z9181 History of falling: Secondary | ICD-10-CM | POA: Diagnosis not present

## 2021-12-23 DIAGNOSIS — Z48811 Encounter for surgical aftercare following surgery on the nervous system: Secondary | ICD-10-CM | POA: Diagnosis not present

## 2021-12-25 DIAGNOSIS — I509 Heart failure, unspecified: Secondary | ICD-10-CM | POA: Diagnosis not present

## 2021-12-25 DIAGNOSIS — Z9989 Dependence on other enabling machines and devices: Secondary | ICD-10-CM | POA: Diagnosis not present

## 2021-12-25 DIAGNOSIS — K5900 Constipation, unspecified: Secondary | ICD-10-CM | POA: Diagnosis not present

## 2021-12-25 DIAGNOSIS — Z792 Long term (current) use of antibiotics: Secondary | ICD-10-CM | POA: Diagnosis not present

## 2021-12-25 DIAGNOSIS — E785 Hyperlipidemia, unspecified: Secondary | ICD-10-CM | POA: Diagnosis not present

## 2021-12-25 DIAGNOSIS — Z48811 Encounter for surgical aftercare following surgery on the nervous system: Secondary | ICD-10-CM | POA: Diagnosis not present

## 2021-12-28 DIAGNOSIS — K5900 Constipation, unspecified: Secondary | ICD-10-CM | POA: Diagnosis not present

## 2021-12-28 DIAGNOSIS — Z48811 Encounter for surgical aftercare following surgery on the nervous system: Secondary | ICD-10-CM | POA: Diagnosis not present

## 2021-12-28 DIAGNOSIS — E785 Hyperlipidemia, unspecified: Secondary | ICD-10-CM | POA: Diagnosis not present

## 2021-12-28 DIAGNOSIS — Z792 Long term (current) use of antibiotics: Secondary | ICD-10-CM | POA: Diagnosis not present

## 2021-12-28 DIAGNOSIS — Z9989 Dependence on other enabling machines and devices: Secondary | ICD-10-CM | POA: Diagnosis not present

## 2021-12-28 DIAGNOSIS — I509 Heart failure, unspecified: Secondary | ICD-10-CM | POA: Diagnosis not present

## 2021-12-30 DIAGNOSIS — E785 Hyperlipidemia, unspecified: Secondary | ICD-10-CM | POA: Diagnosis not present

## 2021-12-30 DIAGNOSIS — I509 Heart failure, unspecified: Secondary | ICD-10-CM | POA: Diagnosis not present

## 2021-12-30 DIAGNOSIS — Z48811 Encounter for surgical aftercare following surgery on the nervous system: Secondary | ICD-10-CM | POA: Diagnosis not present

## 2021-12-30 DIAGNOSIS — Z9989 Dependence on other enabling machines and devices: Secondary | ICD-10-CM | POA: Diagnosis not present

## 2021-12-30 DIAGNOSIS — Z792 Long term (current) use of antibiotics: Secondary | ICD-10-CM | POA: Diagnosis not present

## 2021-12-30 DIAGNOSIS — K5900 Constipation, unspecified: Secondary | ICD-10-CM | POA: Diagnosis not present

## 2022-01-01 DIAGNOSIS — Z48811 Encounter for surgical aftercare following surgery on the nervous system: Secondary | ICD-10-CM | POA: Diagnosis not present

## 2022-01-01 DIAGNOSIS — Z792 Long term (current) use of antibiotics: Secondary | ICD-10-CM | POA: Diagnosis not present

## 2022-01-01 DIAGNOSIS — Z9989 Dependence on other enabling machines and devices: Secondary | ICD-10-CM | POA: Diagnosis not present

## 2022-01-01 DIAGNOSIS — I509 Heart failure, unspecified: Secondary | ICD-10-CM | POA: Diagnosis not present

## 2022-01-01 DIAGNOSIS — E785 Hyperlipidemia, unspecified: Secondary | ICD-10-CM | POA: Diagnosis not present

## 2022-01-01 DIAGNOSIS — K5900 Constipation, unspecified: Secondary | ICD-10-CM | POA: Diagnosis not present

## 2022-01-06 DIAGNOSIS — Z79899 Other long term (current) drug therapy: Secondary | ICD-10-CM | POA: Diagnosis not present

## 2022-01-06 DIAGNOSIS — K5900 Constipation, unspecified: Secondary | ICD-10-CM | POA: Diagnosis not present

## 2022-01-06 DIAGNOSIS — E8581 Light chain (AL) amyloidosis: Secondary | ICD-10-CM | POA: Diagnosis not present

## 2022-01-06 DIAGNOSIS — Z9221 Personal history of antineoplastic chemotherapy: Secondary | ICD-10-CM | POA: Diagnosis not present

## 2022-01-06 DIAGNOSIS — Z792 Long term (current) use of antibiotics: Secondary | ICD-10-CM | POA: Diagnosis not present

## 2022-01-06 DIAGNOSIS — M4802 Spinal stenosis, cervical region: Secondary | ICD-10-CM | POA: Diagnosis not present

## 2022-01-06 DIAGNOSIS — T451X5A Adverse effect of antineoplastic and immunosuppressive drugs, initial encounter: Secondary | ICD-10-CM | POA: Diagnosis not present

## 2022-01-06 DIAGNOSIS — I509 Heart failure, unspecified: Secondary | ICD-10-CM | POA: Diagnosis not present

## 2022-01-06 DIAGNOSIS — R0609 Other forms of dyspnea: Secondary | ICD-10-CM | POA: Diagnosis not present

## 2022-01-06 DIAGNOSIS — I5032 Chronic diastolic (congestive) heart failure: Secondary | ICD-10-CM | POA: Diagnosis not present

## 2022-01-06 DIAGNOSIS — Z48811 Encounter for surgical aftercare following surgery on the nervous system: Secondary | ICD-10-CM | POA: Diagnosis not present

## 2022-01-06 DIAGNOSIS — G4730 Sleep apnea, unspecified: Secondary | ICD-10-CM | POA: Diagnosis not present

## 2022-01-06 DIAGNOSIS — Z9989 Dependence on other enabling machines and devices: Secondary | ICD-10-CM | POA: Diagnosis not present

## 2022-01-06 DIAGNOSIS — E785 Hyperlipidemia, unspecified: Secondary | ICD-10-CM | POA: Diagnosis not present

## 2022-01-06 DIAGNOSIS — G620 Drug-induced polyneuropathy: Secondary | ICD-10-CM | POA: Diagnosis not present

## 2022-01-06 DIAGNOSIS — Z86711 Personal history of pulmonary embolism: Secondary | ICD-10-CM | POA: Diagnosis not present

## 2022-01-12 ENCOUNTER — Inpatient Hospital Stay: Payer: Medicare Other

## 2022-01-12 ENCOUNTER — Other Ambulatory Visit: Payer: Self-pay

## 2022-01-12 ENCOUNTER — Inpatient Hospital Stay: Payer: Medicare Other | Attending: Oncology | Admitting: Oncology

## 2022-01-12 VITALS — BP 129/76 | HR 82 | Temp 98.1°F | Resp 20 | Ht 72.0 in | Wt 191.4 lb

## 2022-01-12 DIAGNOSIS — Z86718 Personal history of other venous thrombosis and embolism: Secondary | ICD-10-CM | POA: Diagnosis not present

## 2022-01-12 DIAGNOSIS — Z86711 Personal history of pulmonary embolism: Secondary | ICD-10-CM | POA: Diagnosis not present

## 2022-01-12 DIAGNOSIS — E859 Amyloidosis, unspecified: Secondary | ICD-10-CM | POA: Insufficient documentation

## 2022-01-12 DIAGNOSIS — Z7901 Long term (current) use of anticoagulants: Secondary | ICD-10-CM | POA: Diagnosis not present

## 2022-01-12 DIAGNOSIS — E8581 Light chain (AL) amyloidosis: Secondary | ICD-10-CM

## 2022-01-12 DIAGNOSIS — Z6825 Body mass index (BMI) 25.0-25.9, adult: Secondary | ICD-10-CM | POA: Diagnosis not present

## 2022-01-12 DIAGNOSIS — Z7189 Other specified counseling: Secondary | ICD-10-CM | POA: Insufficient documentation

## 2022-01-12 DIAGNOSIS — M542 Cervicalgia: Secondary | ICD-10-CM | POA: Diagnosis not present

## 2022-01-12 DIAGNOSIS — Z8546 Personal history of malignant neoplasm of prostate: Secondary | ICD-10-CM | POA: Diagnosis not present

## 2022-01-12 DIAGNOSIS — M4712 Other spondylosis with myelopathy, cervical region: Secondary | ICD-10-CM | POA: Diagnosis not present

## 2022-01-12 DIAGNOSIS — Z79899 Other long term (current) drug therapy: Secondary | ICD-10-CM | POA: Insufficient documentation

## 2022-01-12 LAB — CMP (CANCER CENTER ONLY)
ALT: 11 U/L (ref 0–44)
AST: 15 U/L (ref 15–41)
Albumin: 3.9 g/dL (ref 3.5–5.0)
Alkaline Phosphatase: 83 U/L (ref 38–126)
Anion gap: 8 (ref 5–15)
BUN: 23 mg/dL (ref 8–23)
CO2: 26 mmol/L (ref 22–32)
Calcium: 9.6 mg/dL (ref 8.9–10.3)
Chloride: 103 mmol/L (ref 98–111)
Creatinine: 1.49 mg/dL — ABNORMAL HIGH (ref 0.61–1.24)
GFR, Estimated: 48 mL/min — ABNORMAL LOW (ref >60)
Glucose, Bld: 97 mg/dL (ref 70–99)
Potassium: 4.2 mmol/L (ref 3.5–5.1)
Sodium: 137 mmol/L (ref 135–145)
Total Bilirubin: 0.6 mg/dL (ref 0.3–1.2)
Total Protein: 6.5 g/dL (ref 6.5–8.1)

## 2022-01-12 NOTE — Progress Notes (Signed)
START ON PATHWAY REGIMEN - Multiple Myeloma and Other Plasma Cell Dyscrasias     A cycle is every 28 days:     Lenalidomide      Dexamethasone   **Always confirm dose/schedule in your pharmacy ordering system**  Patient Characteristics: Primary AL Amyloidosis, Second Line and Beyond Disease Classification: Primary AL Amyloidosis Line of therapy: Second Line and Beyond Intent of Therapy: Non-Curative / Palliative Intent, Discussed with Patient

## 2022-01-12 NOTE — Progress Notes (Signed)
DISCONTINUE ON PATHWAY REGIMEN - Multiple Myeloma and Other Plasma Cell Dyscrasias     A cycle is every 28 days:     Lenalidomide      Dexamethasone   **Always confirm dose/schedule in your pharmacy ordering system**  REASON: Other Reason PRIOR TREATMENT: [id number]: Lenalidomide 15 mg D1-21 + Dexamethasone 20 mg D1, 8, 15, 22 q28 Days x 8 Cycles. May Consider Continuing if Tolerated. TREATMENT RESPONSE: Unable to Evaluate  START OFF PATHWAY REGIMEN - Multiple Myeloma and Other Plasma Cell Dyscrasias   [id number]:Daratumumab SUBQ q28 Days:   Cycles 1 and 2: A cycle is every 28 days:     Daratumumab and hyaluronidase-fihj    Cycles 3 through 6: A cycle is every 28 days:     Daratumumab and hyaluronidase-fihj    Cycles 7 and beyond: A cycle is every 28 days:     Daratumumab and hyaluronidase-fihj   **Always confirm dose/schedule in your pharmacy ordering system**  Patient Characteristics: Primary AL Amyloidosis, Second Line and Beyond Disease Classification: Primary AL Amyloidosis Line of therapy: Second Line and Beyond Intent of Therapy: Non-Curative / Palliative Intent, Discussed with Patient

## 2022-01-12 NOTE — Progress Notes (Signed)
[place] OFFICE PROGRESS NOTE   Diagnosis: Amyloidosis  INTERVAL HISTORY:   [name] returns as scheduled.  He underwent cervical spine surgery 12/21/2021.  He wore a brace for 2 weeks following surgery.  He has not noted a change in the neurologic symptoms. He saw Dr. Amalia Allison 01/06/2022.  The serum light chains were higher.  Dr. Amalia Allison recommends observation versus treatment with daratumumab.  He saw cardiology 12/18/2021.  No change was made in the heart regimen.     Objective:  Vital signs in last 24 hours:  Blood pressure 129/76, pulse 82, temperature 98.1 F (36.7 C), temperature source Oral, resp. rate 20, height 6' (1.829 m), weight 191 lb 6.4 oz (86.8 kg), SpO2 100 %.    HEENT: Macroglossia, no bleeding Resp: Lungs clear bilaterally Cardio: Regular rate and rhythm GI: No hepatosplenomegaly Vascular: No leg edema, the right lower leg is larger than the left side    Lab Results:  Lab Results  Component Value Date   WBC 8.4 12/15/2021   HGB 13.2 12/15/2021   HCT 40.3 12/15/2021   MCV 101.3 (H) 12/15/2021   PLT 181 12/15/2021   NEUTROABS 5.6 03/17/2020    CMP  Lab Results  Component Value Date   NA 137 01/12/2022   K 4.2 01/12/2022   CL 103 01/12/2022   CO2 26 01/12/2022   GLUCOSE 97 01/12/2022   BUN 23 01/12/2022   CREATININE 1.49 (H) 01/12/2022   CALCIUM 9.6 01/12/2022   PROT 6.5 01/12/2022   ALBUMIN 3.9 01/12/2022   AST 15 01/12/2022   ALT 11 01/12/2022   ALKPHOS 83 01/12/2022   BILITOT 0.6 01/12/2022   GFRNONAA 48 (L) 01/12/2022   GFRAA >60 04/24/2019    No results found for: CEA1, CEA, WIO973, CA125  Lab Results  Component Value Date   INR 1.20 (L) 03/13/2013   LABPROT 42.6 (H) 11/20/2012    Imaging:  No results found.  Medications: I have reviewed the patient's current medications.   Assessment/Plan: Amyloid involving an eyelid biopsy 06/10/2011. "Bruising" at the eyelids and mouth: Likely related to  amyloidosis, persistent. Numbness and loss of vibratory sense at the fingertips: This predated Velcade-based therapy, but worsened. Now much improved following cervical spine surgery. Elevated serum free lambda light chains. The lambda light chains were lower on November 16 and slightly higher on 12/14/2011. Lambda light chain proteinuria. Bone marrow plasmacytosis: Variable increase in plasma cells noted on the bone marrow biopsy 07/29/2011 with plasma cells estimated to represent between 4% and 20% of the cellular population. Remote history of prostate cancer. Sleep apnea. Dyslipidemia. Report of pneumonia on 2 occasions in 2011. Admission to a hospital in [place], [place] October 2012 with "pneumonia." A chest x-ray at [facility] on November 2 was negative .   Low serum immunoglobulin G level. Plasma cell dyscrasia with associated amyloidosis.   Initiation of systemic therapy with Cytoxan, Velcade, and Decadron 08/12/2011. Cycle #2 was initiated on 09/16/2011. Cycle #3 was initiated on 10/15/2011. Velcade was placed on hold due to neuropathy. The serum free lambda light chains were decreased on October 08 2011. The serum free lambda light chains were slightly increased on 12/14/2011, lower on 12/29/2011 and 02/02/2012. Initiation of Revlimid/Decadron February 2013, cycle 2 started on 01/28/2012. High-dose melphalan chemotherapy followed by autologous stem cell infusion on 02/09/2013 Initiation of Velcade/Decadrontherapy 05/28/2013, he completed 2 cycles on a day 1, 4, 8, 11 schedule Improvement in the serum free lambda light chains following induction Velcade/Decadron  Initiation of weekly Velcade/Decadron on 07/16/2013, last treatment on 10/16/2013 Improvement in the serum free lambda light chains on 08/21/2013 and 09/18/2013 [facility]) Normal serum free lambda light chains on 12/13/2013 Mild increase of the serum free lambda light chains beginning January 2016-stable  treatment on the  Pronto study at [facility] [place] [facility] beginning 12/17/2015, in  of study 11/23/2016 Started on phase 2b Prothena study-long-term safety/efficacy study 01/11/2017, discontinued May 2018  Doxycycline started 05/17/2017, changed to once daily August 2022 15. Loss of "balance "and proximal motor weakness secondary to cervical stenosis-status post decompression surgery on 02/19/2012. Improved, but not resolved stenosis with mass effect on the spinal cord noted on a repeat MRI 03/28/2012. He underwent further decompression surgery on 04/06/2012. The ataxia, weakness, and peripheral numbness is much improved.   16. Right lower Extremity deep vein thrombosis 02/02/2012-a Doppler ultrasound confirmed a gastrocnemius and peroneal deep vein thrombosis. An IVC filter was placed prior to surgery and he was maintained on Lovenox. The IVC filter was removed on 05/04/2012.   17. Anorexia following cervical spine surgery. Improved.   18. Episode of flank pain and hematuria in 03/31/2012-etiology unclear. He completed a course of antibiotics. The hematuria and pain have resolved.   19. Rectal wall thickening noted on a CT of the pelvis 03/31/2012.   20. colonoscopy showed an area of abnormality at the rectum with biopsy positive for amyloid.   21. Lung nodule noted on the CT 03/31/2012 , no lung nodule on a chest CT 07/30/2013   22. Lumbar stenosis-status post surgery on 07/31/2012.   23. Status post stem cell collection.   24.Rright internal jugular thrombosis, 10/22/2012, likely related to pheresis catheter and HIT   25. Admission with acute right pulmonary embolism 10/27/2012   26. Superior mesenteric vein/portal vein thrombosis diagnosed on a CT of the abdomen 10/28/2012 and 10/29/2012-treated with Argatroban , maintained on Coumadin anticoagulation until he hospital admission March 2014 for stem cell therapy   27. HIT confirmed on a serotonin release assay 10/29/2012   28. History of Renal insufficiency during the December 2013  hospital admission-likely related to dehydration and? Contrast nephropathy , persistent mild elevation of the creatinine   29. Dysphagia-? Secondary to amyloidosis, reported when he was here on 05/08/2013. He did not complain of dysphagia today   30. Anemia secondary to multiple myeloma and chemotherapy , improved   31.right lower Extremity deep vein thrombosis diagnosed at [facility] July 2014 , repeat Doppler at [facility] 07/19/2013 with chronic thrombus in the right gastrocnemius, Eliquis has been discontinued   32. Fall August 2014 with a left shoulder injury, diagnosed with a rotator cuff tear, status post surgical repair 09/19/2013   33. 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 The neuropathy symptoms have partially improved since Velcade was discontinued after treatment 10/16/2013   34. Cardiac MRI 05/22/2015-normal left ventricular size with severe concentric hypertrophy and mildly impaired systolic function, [id number]%, right ventricular hypertrophy, diffuse late gadolinium enhancement in the left and right ventricles-findings consistent with cardiac amyloidosis. Echocardiogram at [facility]-LVEF 50% Echocardiogram at [facility] 04/20/2018 - left ventricular hypertrophy, LVEF 55-60% Echocardiogram at [facility] 08/28/2019-left ventricular ejection fraction 55-60%, left ventricle normal in size with upper normal wall thickness 35. Multinodular goiter confirmed on thyroid ultrasound at [facility] 06/29/2016, amyloidosis of the thyroid confirmed at [facility] 04/20/2018     Disposition: Mr Hornstein has a history of AL amyloidosis.  He has a history of cardiac amyloid.  The serum light chains are rising.  He understands it is unclear whether the amyloid  is contributing to his fatigue and neuropathy symptoms.  He would like to proceed with daratumumab.  I reviewed potential toxicities associated with daratumumab including the chance of an allergic reaction, rash, hematologic toxicity, and pulmonary toxicity.  The plan is to  begin subcutaneous daratumumab within the next 1-2 weeks.  We obtained a baseline myeloma panel today.  A treatment plan was entered today.  Betsy Coder, MD  01/12/2022  1:08 PM

## 2022-01-13 ENCOUNTER — Other Ambulatory Visit: Payer: Self-pay | Admitting: *Deleted

## 2022-01-13 DIAGNOSIS — E8581 Light chain (AL) amyloidosis: Secondary | ICD-10-CM

## 2022-01-13 LAB — KAPPA/LAMBDA LIGHT CHAINS
Kappa free light chain: 30.8 mg/L — ABNORMAL HIGH (ref 3.3–19.4)
Kappa, lambda light chain ratio: 0.21 — ABNORMAL LOW (ref 0.26–1.65)
Lambda free light chains: 145.8 mg/L — ABNORMAL HIGH (ref 5.7–26.3)

## 2022-01-14 ENCOUNTER — Telehealth: Payer: Self-pay | Admitting: *Deleted

## 2022-01-14 LAB — MULTIPLE MYELOMA PANEL, SERUM
Albumin SerPl Elph-Mcnc: 3.4 g/dL (ref 2.9–4.4)
Albumin/Glob SerPl: 1.1 (ref 0.7–1.7)
Alpha 1: 0.3 g/dL (ref 0.0–0.4)
Alpha2 Glob SerPl Elph-Mcnc: 0.9 g/dL (ref 0.4–1.0)
B-Globulin SerPl Elph-Mcnc: 0.9 g/dL (ref 0.7–1.3)
Gamma Glob SerPl Elph-Mcnc: 1 g/dL (ref 0.4–1.8)
Globulin, Total: 3.1 g/dL (ref 2.2–3.9)
IgA: 174 mg/dL (ref 61–437)
IgG (Immunoglobin G), Serum: 1071 mg/dL (ref 603–1613)
IgM (Immunoglobulin M), Srm: 125 mg/dL (ref 15–143)
M Protein SerPl Elph-Mcnc: 0.4 g/dL — ABNORMAL HIGH
Total Protein ELP: 6.5 g/dL (ref 6.0–8.5)

## 2022-01-14 NOTE — Telephone Encounter (Signed)
Per Dr. [name]: Dr. Amalia Hailey wants patient to have a BMBX w/molecular studies before he begins the Darzalex Faspro. Patient notified and he agrees with the plan. Prefers the appointment for BMBX to be in am. Informed him we will work on scheduling this tomorrow for first available.

## 2022-01-15 ENCOUNTER — Other Ambulatory Visit: Payer: Self-pay | Admitting: Oncology

## 2022-01-15 DIAGNOSIS — E8581 Light chain (AL) amyloidosis: Secondary | ICD-10-CM

## 2022-01-19 ENCOUNTER — Telehealth: Payer: Self-pay | Admitting: *Deleted

## 2022-01-19 NOTE — Telephone Encounter (Signed)
Informed Brent Allison of bone marrow biospy at [facility] on 3/6 w/directions and instructions. He will cancel 3/3 treatment and start on 3/9. Informed him that the information was sent to him on MyChart message as well.

## 2022-01-22 ENCOUNTER — Other Ambulatory Visit: Payer: Self-pay | Admitting: Student

## 2022-01-22 ENCOUNTER — Inpatient Hospital Stay: Payer: Medicare Other

## 2022-01-22 NOTE — Progress Notes (Signed)
Spoke with patient 01/22/22 @ 10:35 am. Arrive at 7:30 am for 8:30 am procedure, NPO after midnight, need for driver post procedure reviewed with patient. Patient states that his neighbor will be picking him up post procedure, instructed to bring a good phone number for neighbor on day of procedure so that we can reach them when ready. Patient verbalized understanding. ?

## 2022-01-22 NOTE — Progress Notes (Signed)
Called 01/22/22 @ 10:18. No answer, left VM to return call. ?

## 2022-01-25 ENCOUNTER — Other Ambulatory Visit: Payer: Self-pay

## 2022-01-25 ENCOUNTER — Ambulatory Visit
Admission: RE | Admit: 2022-01-25 | Discharge: 2022-01-25 | Disposition: A | Discharge status: Home / Self Care | Payer: Medicare Other | Source: Ambulatory Visit | Attending: Oncology | Admitting: Oncology

## 2022-01-25 DIAGNOSIS — C9000 Multiple myeloma not having achieved remission: Secondary | ICD-10-CM | POA: Insufficient documentation

## 2022-01-25 DIAGNOSIS — E8581 Light chain (AL) amyloidosis: Secondary | ICD-10-CM | POA: Diagnosis not present

## 2022-01-25 DIAGNOSIS — E8809 Other disorders of plasma-protein metabolism, not elsewhere classified: Secondary | ICD-10-CM | POA: Diagnosis not present

## 2022-01-25 DIAGNOSIS — D649 Anemia, unspecified: Secondary | ICD-10-CM | POA: Insufficient documentation

## 2022-01-25 DIAGNOSIS — D7589 Other specified diseases of blood and blood-forming organs: Secondary | ICD-10-CM | POA: Diagnosis not present

## 2022-01-25 DIAGNOSIS — Q999 Chromosomal abnormality, unspecified: Secondary | ICD-10-CM | POA: Insufficient documentation

## 2022-01-25 DIAGNOSIS — G4730 Sleep apnea, unspecified: Secondary | ICD-10-CM | POA: Diagnosis not present

## 2022-01-25 DIAGNOSIS — Z9989 Dependence on other enabling machines and devices: Secondary | ICD-10-CM | POA: Diagnosis not present

## 2022-01-25 DIAGNOSIS — D4989 Neoplasm of unspecified behavior of other specified sites: Secondary | ICD-10-CM | POA: Diagnosis not present

## 2022-01-25 DIAGNOSIS — E859 Amyloidosis, unspecified: Secondary | ICD-10-CM | POA: Diagnosis not present

## 2022-01-25 LAB — CBC WITH DIFFERENTIAL/PLATELET
Abs Immature Granulocytes: 0.02 10*3/uL (ref 0.00–0.07)
Basophils Absolute: 0 10*3/uL (ref 0.0–0.1)
Basophils Relative: 1 %
Eosinophils Absolute: 0.1 10*3/uL (ref 0.0–0.5)
Eosinophils Relative: 1 %
HCT: 38.9 % — ABNORMAL LOW (ref 39.0–52.0)
Hemoglobin: 12.7 g/dL — ABNORMAL LOW (ref 13.0–17.0)
Immature Granulocytes: 0 %
Lymphocytes Relative: 28 %
Lymphs Abs: 2.2 10*3/uL (ref 0.7–4.0)
MCH: 32 pg (ref 26.0–34.0)
MCHC: 32.6 g/dL (ref 30.0–36.0)
MCV: 98 fL (ref 80.0–100.0)
Monocytes Absolute: 0.7 10*3/uL (ref 0.1–1.0)
Monocytes Relative: 9 %
Neutro Abs: 4.8 10*3/uL (ref 1.7–7.7)
Neutrophils Relative %: 61 %
Platelets: 165 10*3/uL (ref 150–400)
RBC: 3.97 MIL/uL — ABNORMAL LOW (ref 4.22–5.81)
RDW: 13.5 % (ref 11.5–15.5)
WBC: 7.8 10*3/uL (ref 4.0–10.5)
nRBC: 0 % (ref 0.0–0.2)

## 2022-01-25 MED ORDER — MIDAZOLAM HCL 2 MG/2ML IJ SOLN
INTRAMUSCULAR | Status: AC
  Filled 2022-01-25: qty 2

## 2022-01-25 MED ORDER — HEPARIN SOD (PORK) LOCK FLUSH 100 UNIT/ML IV SOLN
INTRAVENOUS | Status: AC
  Filled 2022-01-25: qty 5

## 2022-01-25 MED ORDER — SODIUM CHLORIDE 0.9 % IV SOLN: INTRAVENOUS | Status: DC

## 2022-01-25 MED ORDER — MIDAZOLAM HCL 5 MG/5ML IJ SOLN
INTRAMUSCULAR | Status: AC | PRN
  Administered 2022-01-25: 1 mg via INTRAVENOUS

## 2022-01-25 MED ORDER — FENTANYL CITRATE (PF) 100 MCG/2ML IJ SOLN
INTRAMUSCULAR | Status: AC
  Filled 2022-01-25: qty 2

## 2022-01-25 MED ORDER — FENTANYL CITRATE (PF) 100 MCG/2ML IJ SOLN
INTRAMUSCULAR | Status: AC | PRN
  Administered 2022-01-25: 50 ug via INTRAVENOUS

## 2022-01-25 NOTE — Progress Notes (Signed)
Patient clinically stable post BMB per Dr Kathlene Cote, tolerated well. Vitals stable pre and post procedure. Received Versed 1 mg along with Fentanyl 50 mcg IV for procedure. Report given to Brent Michael Rn post procedure/specials. Discharge instructions given. ?

## 2022-01-25 NOTE — H&P (Signed)
Chief Complaint: Patient was seen in consultation today for bone marrow biopsy at the request of Sherrill,Gary B  Referring Physician(s): Ladell Pier  Supervising Physician: Aletta Edouard  Patient Status: [facility] - Out-pt  History of Present Illness: Brent Allison is a 76 y.o. male with PMHx significant for AL amyloidosis and plasma cell dyscrasia who follows with Dr. [name] and was recently seen in office 2/21 with no medical changes since. He is here today for image guided bone marrow biopsy for restaging.   The patient has had a H&P performed within the last 30 days, all history, medications, and exam have been reviewed. The patient denies any interval changes since the H&P.  The patient denies any current chest pain or shortness of breath. The patient does have sleep apnea and uses a CPAP. He has no known complications to sedation.    Past Medical History:  Diagnosis Date   AL amyloidosis ([facility]) 11/04/2012   Arthritis    lumbar DDD   Blood dyscrasia    plasma cell dyscrasia with associated amyloidosis   CKD (chronic kidney disease)    DVT (deep venous thrombosis) ([facility])    right   Dyslipidemia    H/O multiple myeloma    Heparin induced thrombocytopenia (HIT) 11/04/2012   Joint pain    Peripheral vascular disease ([place])    DVT- 01/2012, follows by Dr. [name], lovenox maintained since Spring 2013 , pt. aware that last dose is sch. for 07/22/2012   Pneumonia    hx of with last time in Nov 2012   Prostate cancer [facility])    amyloidosis, multiple myeloma    Sleep apnea    severe with AHI 44.4 events per hour - now on CPAP at 11cm H2O   Spastic quadriparesis [facility])     Past Surgical History:  Procedure Laterality Date   ANTERIOR CERVICAL DECOMP/DISCECTOMY FUSION  02/26/2012   Procedure: ANTERIOR CERVICAL DECOMPRESSION/DISCECTOMY FUSION 2 LEVELS;  Surgeon: Hosie Spangle, MD;  Location: [place] NEURO ORS;  Service: Neurosurgery;  Laterality: N/A;  C3-4 C4-5 Anterior  cervical decompression/diskectomy, fusion   ANTERIOR CERVICAL DECOMP/DISCECTOMY FUSION N/A 12/21/2021   Procedure: CERVICAL FIVE-SIX ANTERIOR CERVICAL DECOMPRESSION/DISCECTOMY FUSION;  Surgeon: Newman Pies, MD;  Location: [facility];  Service: Neurosurgery;  Laterality: N/A;  3C   BACK SURGERY     CARDIAC CATHETERIZATION  2010   CARPAL TUNNEL RELEASE  2009   left   COLONOSCOPY     HERNIA REPAIR     around 1985   ivt     IVC filter placed d/t blood clots, removed- July- 2013   LIMBAL STEM CELL TRANSPLANT     LIPOMA EXCISION  around [place]   removed from one of his shoulders   POSTERIOR CERVICAL FUSION/FORAMINOTOMY  04/06/2012   Procedure: POSTERIOR CERVICAL FUSION/FORAMINOTOMY LEVEL 3;  Surgeon: Hosie Spangle, MD;  Location: [facility] NEURO ORS;  Service: Neurosurgery;  Laterality: N/A;  C3 and C4 laminectomy with C23 C34 C45 posterior cervical arthrodesis with instrumentation   POSTERIOR CERVICAL FUSION/FORAMINOTOMY N/A 06/02/2015   Procedure: Cervical Seven, Thoracic One, Thoracic Two Laminectomies with Cervical Seven-Thoracic One, Thoracic One-Thoracic Two Posterior Cervico-Thoracic  Arthrodesis with Instrumentation;  Surgeon: Jovita Gamma, MD;  Location: [place] NEURO ORS;  Service: Neurosurgery;  Laterality: N/A;  C7, T1, T2 laminectomies with C7-T2 cerevico thoracic arthrodesis with instrumentation   POSTERIOR LAMINECTOMY / DECOMPRESSION LUMBAR SPINE  around 2007   L4-5   PROSTATECTOMY  around 2005   TONSILLECTOMY  as a child    Allergies: Enoxaparin and Heparin  Medications: Prior to Admission medications   Medication Sig Start Date End Date Taking? Authorizing Provider  Cyanocobalamin (VITAMIN B 12 PO) Take 1,000 mg by mouth at bedtime.   Yes [provider]  cyclobenzaprine (FLEXERIL) 10 MG tablet Take 1 tablet (10 mg total) by mouth 3 (three) times daily as needed for muscle spasms. 12/22/21  Yes Viona Gilmore D, NP  diphenhydrAMINE (BENADRYL) 25 MG tablet Take 25 mg by  mouth at bedtime as needed for sleep.   Yes [provider]  docusate sodium (COLACE) 100 MG capsule Take 1 capsule (100 mg total) by mouth 2 (two) times daily. 12/22/21  Yes Viona Gilmore D, NP  doxycycline (VIBRA-TABS) 100 MG tablet Take 100 mg by mouth daily. 03/11/20  Yes [provider]  furosemide (LASIX) 20 MG tablet Take 40 mg by mouth every other day. 06/17/17  Yes [provider]  oxyCODONE-acetaminophen (PERCOCET) 10-325 MG tablet Take 1 tablet by mouth every 4 (four) hours as needed for pain. 12/22/21  Yes Viona Gilmore D, NP  oxyCODONE-acetaminophen (PERCOCET) 10-325 MG tablet Take 1 tablet by mouth every 6 (six) hours as needed for pain. 12/22/21 12/22/22 Yes Viona Gilmore D, NP  spironolactone (ALDACTONE) 25 MG tablet Take 25 mg by mouth daily. 05/28/19  Yes [provider]  rosuvastatin (CRESTOR) 10 MG tablet Take 10 mg by mouth daily.    [provider]     Family History  Problem Relation Age of Onset   Pancreatic cancer Mother    Anesthesia problems Neg Hx    Hypotension Neg Hx    Malignant hyperthermia Neg Hx    Pseudochol deficiency Neg Hx     Social History   Socioeconomic History   Marital status: Married    Spouse name: Not on file   Number of children: Not on file   Years of education: Not on file   Highest education level: Not on file  Occupational History   Not on file  Tobacco Use   Smoking status: Former    Packs/day: 0.50    Years: 10.00    Pack years: 5.00    Types: Cigarettes    Quit date: 07/21/1979    Years since quitting: 42.5   Smokeless tobacco: Never   Tobacco comments:    quit 25+yrs ago  Vaping Use   Vaping Use: Never used  Substance and Sexual Activity   Alcohol use: Yes    Alcohol/week: 2.0 standard drinks    Types: 2 Cans of beer per week    Comment: occasionally   Drug use: No   Sexual activity: Yes  Other Topics Concern   Not on file  Social History Narrative   Not on file    Social Determinants of Health   Financial Resource Strain: Not on file  Food Insecurity: Not on file  Transportation Needs: Not on file  Physical Activity: Not on file  Stress: Not on file  Social Connections: Not on file    Review of Systems: A 12 point ROS discussed and pertinent positives are indicated in the HPI above.  All other systems are negative.  Review of Systems  Vital Signs: BP 133/68    Pulse 69    Temp 98.4 F (36.9 C) (Oral)    Resp 20    Ht 6' (1.829 m)    Wt 195 lb (88.5 kg)    SpO2 98%    BMI 26.45  kg/m   Physical Exam Constitutional:      Appearance: Normal appearance.  HENT:     Head: Normocephalic and atraumatic.  Cardiovascular:     Rate and Rhythm: Normal rate and regular rhythm.  Pulmonary:     Effort: Pulmonary effort is normal.     Breath sounds: Normal breath sounds.  Neurological:     Mental Status: He is alert and oriented to person, place, and time.    Imaging: No results found.  Labs:  CBC: Recent Labs    12/15/21 1136 01/25/22 0752  WBC 8.4 7.8  HGB 13.2 12.7*  HCT 40.3 38.9*  PLT 181 165    COAGS: No results for input(s): INR, APTT in the last 8760 hours.  BMP: Recent Labs    12/15/21 1136 01/12/22 0941  NA 140 137  K 3.9 4.2  CL 107 103  CO2 26 26  GLUCOSE 95 97  BUN 16 23  CALCIUM 9.2 9.6  CREATININE 1.40* 1.49*  GFRNONAA 52* 48*    LIVER FUNCTION TESTS: Recent Labs    01/12/22 0941  BILITOT 0.6  AST 15  ALT 11  ALKPHOS 83  PROT 6.5  ALBUMIN 3.9    Assessment and Plan: 76 year old male with PMHx significant for AL amyloidosis and plasma cell dyscrasia who follows with Dr. [name] and was recently seen in office 2/21 with no medical changes since. He is here today for image guided bone marrow biopsy for restaging.   The patient has been NPO, labs and vitals have been reviewed.  Risks and benefits of CT guided bone marrow biopsy with moderate sedation was discussed with the patient and/or  patient's family including, but not limited to bleeding, infection, damage to adjacent structures or low yield requiring additional tests.  All of the questions were answered and there is agreement to proceed.  Consent signed and in chart.   Thank you for this interesting consult.  I greatly enjoyed meeting Brent Allison and look forward to participating in their care.  A copy of this report was sent to the requesting provider on this date.  Electronically Signed: Hedy Jacob, PA-C 01/25/2022, 8:25 AM   I spent a total of 20 Minutes in face to face in clinical consultation, greater than 50% of which was counseling/coordinating care for multiple myeloma/amyloidosis.

## 2022-01-25 NOTE — Procedures (Signed)
Interventional Radiology Procedure Note  Procedure: CT guided bone marrow aspiration and biopsy  Complications: None  EBL: < 10 mL  Findings: Aspirate and core biopsy performed of bone marrow in right iliac bone.  Plan: Bedrest supine x 1 hrs  Brent Allison, M.D Pager:  [phone]   

## 2022-01-26 DIAGNOSIS — H2513 Age-related nuclear cataract, bilateral: Secondary | ICD-10-CM | POA: Diagnosis not present

## 2022-01-26 DIAGNOSIS — H52203 Unspecified astigmatism, bilateral: Secondary | ICD-10-CM | POA: Diagnosis not present

## 2022-01-28 ENCOUNTER — Inpatient Hospital Stay: Payer: Medicare Other | Attending: Oncology

## 2022-01-28 ENCOUNTER — Other Ambulatory Visit: Payer: Self-pay

## 2022-01-28 ENCOUNTER — Inpatient Hospital Stay: Payer: Medicare Other

## 2022-01-28 ENCOUNTER — Other Ambulatory Visit: Payer: Self-pay | Admitting: *Deleted

## 2022-01-28 VITALS — BP 111/67 | HR 67 | Temp 98.6°F | Resp 18 | Ht 72.0 in | Wt 195.0 lb

## 2022-01-28 DIAGNOSIS — E8581 Light chain (AL) amyloidosis: Secondary | ICD-10-CM

## 2022-01-28 DIAGNOSIS — E859 Amyloidosis, unspecified: Secondary | ICD-10-CM | POA: Insufficient documentation

## 2022-01-28 DIAGNOSIS — Z7901 Long term (current) use of anticoagulants: Secondary | ICD-10-CM | POA: Diagnosis not present

## 2022-01-28 DIAGNOSIS — Z86711 Personal history of pulmonary embolism: Secondary | ICD-10-CM | POA: Diagnosis not present

## 2022-01-28 DIAGNOSIS — Z79899 Other long term (current) drug therapy: Secondary | ICD-10-CM | POA: Insufficient documentation

## 2022-01-28 DIAGNOSIS — Z8546 Personal history of malignant neoplasm of prostate: Secondary | ICD-10-CM | POA: Insufficient documentation

## 2022-01-28 DIAGNOSIS — Z86718 Personal history of other venous thrombosis and embolism: Secondary | ICD-10-CM | POA: Diagnosis not present

## 2022-01-28 LAB — CBC WITH DIFFERENTIAL (CANCER CENTER ONLY)
Abs Immature Granulocytes: 0.04 10*3/uL (ref 0.00–0.07)
Basophils Absolute: 0 10*3/uL (ref 0.0–0.1)
Basophils Relative: 0 %
Eosinophils Absolute: 0.1 10*3/uL (ref 0.0–0.5)
Eosinophils Relative: 1 %
HCT: 39 % (ref 39.0–52.0)
Hemoglobin: 12.9 g/dL — ABNORMAL LOW (ref 13.0–17.0)
Immature Granulocytes: 1 %
Lymphocytes Relative: 28 %
Lymphs Abs: 2.3 10*3/uL (ref 0.7–4.0)
MCH: 31.9 pg (ref 26.0–34.0)
MCHC: 33.1 g/dL (ref 30.0–36.0)
MCV: 96.3 fL (ref 80.0–100.0)
Monocytes Absolute: 0.8 10*3/uL (ref 0.1–1.0)
Monocytes Relative: 10 %
Neutro Abs: 5 10*3/uL (ref 1.7–7.7)
Neutrophils Relative %: 60 %
Platelet Count: 163 10*3/uL (ref 150–400)
RBC: 4.05 MIL/uL — ABNORMAL LOW (ref 4.22–5.81)
RDW: 13.4 % (ref 11.5–15.5)
WBC Count: 8.2 10*3/uL (ref 4.0–10.5)
nRBC: 0 % (ref 0.0–0.2)

## 2022-01-28 LAB — TYPE AND SCREEN
ABO/RH(D): A POS
Antibody Screen: NEGATIVE

## 2022-01-28 MED ORDER — MONTELUKAST SODIUM 10 MG PO TABS
10.0000 mg | ORAL_TABLET | Freq: Once | ORAL | Status: AC
  Administered 2022-01-28: 11:00:00 10 mg via ORAL
  Filled 2022-01-28: qty 1

## 2022-01-28 MED ORDER — ACETAMINOPHEN 325 MG PO TABS
650.0000 mg | ORAL_TABLET | Freq: Once | ORAL | Status: AC
  Administered 2022-01-28: 11:00:00 650 mg via ORAL
  Filled 2022-01-28: qty 2

## 2022-01-28 MED ORDER — DARATUMUMAB-HYALURONIDASE-FIHJ 1800-30000 MG-UT/15ML ~~LOC~~ SOLN
1800.0000 mg | Freq: Once | SUBCUTANEOUS | Status: AC
  Administered 2022-01-28: 12:00:00 1800 mg via SUBCUTANEOUS
  Filled 2022-01-28: qty 15

## 2022-01-28 MED ORDER — DIPHENHYDRAMINE HCL 25 MG PO CAPS
50.0000 mg | ORAL_CAPSULE | Freq: Once | ORAL | Status: AC
  Administered 2022-01-28: 11:00:00 50 mg via ORAL
  Filled 2022-01-28: qty 2

## 2022-01-28 MED ORDER — DEXAMETHASONE 4 MG PO TABS
20.0000 mg | ORAL_TABLET | Freq: Once | ORAL | Status: AC
  Administered 2022-01-28: 11:00:00 20 mg via ORAL
  Filled 2022-01-28: qty 5

## 2022-01-28 NOTE — Progress Notes (Signed)
Pharmacist Chemotherapy Monitoring - Initial Assessment   ? ?Anticipated start date: 01/28/22  ? ?The following has been reviewed per standard work regarding the patient's treatment regimen: ?The patient's diagnosis, treatment plan and drug doses, and organ/hematologic function ?Lab orders and baseline tests specific to treatment regimen  ?The treatment plan start date, drug sequencing, and pre-medications ?Prior authorization status  ?Patient's documented medication list, including drug-drug interaction screen and prescriptions for anti-emetics and supportive care specific to the treatment regimen ?The drug concentrations, fluid compatibility, administration routes, and timing of the medications to be used ?The patient's access for treatment and lifetime cumulative dose history, if applicable  ?The patient's medication allergies and previous infusion related reactions, if applicable  ? ?Changes made to treatment plan:  ?N/A ? ?Follow up needed:  ?N/A ? ? ?Brent Allison, [facility], ?01/28/2022  10:44 AM  ?

## 2022-01-28 NOTE — Patient Instructions (Signed)
[facility]   Discharge Instructions: Thank you for choosing [facility] to provide your oncology and hematology care.   If you have a lab appointment with the [facility], please go directly to the [facility] and check in at the registration area.   Wear comfortable clothing and clothing appropriate for easy access to any Portacath or PICC line.   We strive to give you quality time with your provider. You may need to reschedule your appointment if you arrive late (15 or more minutes).  Arriving late affects you and other patients whose appointments are after yours.  Also, if you miss three or more appointments without notifying the office, you may be dismissed from the clinic at the providers discretion.      For prescription refill requests, have your pharmacy contact our office and allow 72 hours for refills to be completed.    Today you received the following chemotherapy and/or immunotherapy agents Daratumumab-hyaluronidase (DARZALEX FASPRO).      To help prevent nausea and vomiting after your treatment, we encourage you to take your nausea medication as directed.  BELOW ARE SYMPTOMS THAT SHOULD BE REPORTED IMMEDIATELY: *FEVER GREATER THAN 100.4 F (38 C) OR HIGHER *CHILLS OR SWEATING *NAUSEA AND VOMITING THAT IS NOT CONTROLLED WITH YOUR NAUSEA MEDICATION *UNUSUAL SHORTNESS OF BREATH *UNUSUAL BRUISING OR BLEEDING *URINARY PROBLEMS (pain or burning when urinating, or frequent urination) *BOWEL PROBLEMS (unusual diarrhea, constipation, pain near the anus) TENDERNESS IN MOUTH AND THROAT WITH OR WITHOUT PRESENCE OF ULCERS (sore throat, sores in mouth, or a toothache) UNUSUAL RASH, SWELLING OR PAIN  UNUSUAL VAGINAL DISCHARGE OR ITCHING   Items with * indicate a potential emergency and should be followed up as soon as possible or go to the Emergency Department if any problems should occur.  Please show the CHEMOTHERAPY ALERT CARD or  IMMUNOTHERAPY ALERT CARD at check-in to the Emergency Department and triage nurse.  Should you have questions after your visit or need to cancel or reschedule your appointment, please contact [facility]  Dept: [phone]  and follow the prompts.  Office hours are 8:00 a.m. to 4:30 p.m. Monday - Friday. Please note that voicemails left after 4:00 p.m. may not be returned until the following business day.  We are closed weekends and major holidays. You have access to a nurse at all times for urgent questions. Please call the main number to the clinic Dept: [phone] and follow the prompts.   For any non-urgent questions, you may also contact your provider using MyChart. We now offer e-Visits for anyone 34 and older to request care online for non-urgent symptoms. For details visit [email or web address].   Also download the MyChart app! Go to the app store, search "MyChart", open the app, select [facility], and log in with your MyChart username and password.  Due to Covid, a mask is required upon entering the hospital/clinic. If you do not have a mask, one will be given to you upon arrival. For doctor visits, patients may have 1 support person aged 8 or older with them. For treatment visits, patients cannot have anyone with them due to current Covid guidelines and our immunocompromised population.   Daratumumab; Hyaluronidase Injection What is this medication? DARATUMUMAB; HYALURONIDASE (dar a toom ue mab / hye al ur ON i dase) is a monoclonal antibody. Hyaluronidase is used to improve the effects of daratumumab. It treats certain types of cancer. Some of the cancers treated are  multiple myeloma and light-chain amyloidosis. This medicine may be used for other purposes; ask your health care provider or pharmacist if you have questions. COMMON BRAND NAME(S): DARZALEX FASPRO What should I tell my care team before I take this medication? They need to know if you have any  of these conditions: heart disease infection especially a viral infection such as chickenpox, cold sores, herpes, or hepatitis B lung or breathing disease an unusual or allergic reaction to daratumumab, hyaluronidase, other medicines, foods, dyes, or preservatives pregnant or trying to get pregnant breast-feeding How should I use this medication? This medicine is for injection under the skin. It is given by a health care professional in a hospital or clinic setting. Talk to your pediatrician regarding the use of this medicine in children. Special care may be needed. Overdosage: If you think you have taken too much of this medicine contact a poison control center or emergency room at once. NOTE: This medicine is only for you. Do not share this medicine with others. What if I miss a dose? Keep appointments for follow-up doses as directed. It is important not to miss your dose. Call your doctor or health care professional if you are unable to keep an appointment. What may interact with this medication? Interactions have not been studied. This list may not describe all possible interactions. Give your health care provider a list of all the medicines, herbs, non-prescription drugs, or dietary supplements you use. Also tell them if you smoke, drink alcohol, or use illegal drugs. Some items may interact with your medicine. What should I watch for while using this medication? Your condition will be monitored carefully while you are receiving this medicine. This medicine can cause serious allergic reactions. To reduce your risk, your health care provider may give you other medicine to take before receiving this one. Be sure to follow the directions from your health care provider. This medicine can affect the results of blood tests to match your blood type. These changes can last for up to 6 months after the final dose. Your healthcare provider will do blood tests to match your blood type before you start  treatment. Tell all of your healthcare providers that you are being treated with this medicine before receiving a blood transfusion. This medicine can affect the results of some tests used to determine treatment response; extra tests may be needed to evaluate response. Do not become pregnant while taking this medicine or for 3 months after stopping it. Women should inform their health care provider if they wish to become pregnant or think they might be pregnant. There is a potential for serious side effects to an unborn child. Talk to your health care provider for more information. Do not breast-feed an infant while taking this medicine. What side effects may I notice from receiving this medication? Side effects that you should report to your care team as soon as possible: Allergic reactions--skin rash, itching or hives, swelling of the face, lips, or tongue Blood clot--chest pain, shortness of breath, pain, swelling or warmth in the leg Blurred vision Fast, irregular heartbeat Infection--fever, chills, cough, sore throat, pain or trouble passing urine Injection reactions--dizziness, fast heartbeat, feeling faint or lightheaded, falls, headache, increase in blood pressure, nausea, vomiting, or wheezing or trouble breathing with loud or whistling sounds Low red blood cell counts--trouble breathing, feeling faint, lightheaded or falls, unusually weak or tired Unusual bleeding or bruising Side effects that usually do not require medical attention (report these to your care team  if they continue or are bothersome): Back pain Constipation Diarrhea Pain, tingling, numbness in the hands or feet Pain, redness, or irritation at site where injected Muscle cramp or pain Swelling of the ankles, feet, hands Tiredness Trouble sleeping This list may not describe all possible side effects. Call your doctor for medical advice about side effects. You may report side effects to FDA at 1-800-FDA-1088. Where  should I keep my medication? This drug is given in a hospital or clinic and will not be stored at home. NOTE: This sheet is a summary. It may not cover all possible information. If you have questions about this medicine, talk to your doctor, pharmacist, or health care provider.  2022 [employer]vier/[employer] Standard (2021-07-28 00:00:00)

## 2022-01-29 ENCOUNTER — Telehealth: Payer: Self-pay

## 2022-01-29 LAB — PRETREATMENT RBC PHENOTYPE

## 2022-01-29 NOTE — Telephone Encounter (Signed)
24 Hour Call Back ? ?Follow up call to patient post first time DaraFaspro. Patient stated he has felt fatigued but denies any other complaints or concerns. Patient knows to call the clinic with any questions. ?

## 2022-01-31 ENCOUNTER — Other Ambulatory Visit: Payer: Self-pay | Admitting: Oncology

## 2022-02-01 ENCOUNTER — Encounter (HOSPITAL_COMMUNITY): Payer: Self-pay | Admitting: Oncology

## 2022-02-02 DIAGNOSIS — Z1339 Encounter for screening examination for other mental health and behavioral disorders: Secondary | ICD-10-CM | POA: Diagnosis not present

## 2022-02-02 DIAGNOSIS — I5032 Chronic diastolic (congestive) heart failure: Secondary | ICD-10-CM | POA: Diagnosis not present

## 2022-02-02 DIAGNOSIS — I700 Atherosclerosis of aorta: Secondary | ICD-10-CM | POA: Diagnosis not present

## 2022-02-02 DIAGNOSIS — Z1331 Encounter for screening for depression: Secondary | ICD-10-CM | POA: Diagnosis not present

## 2022-02-02 DIAGNOSIS — E859 Amyloidosis, unspecified: Secondary | ICD-10-CM | POA: Diagnosis not present

## 2022-02-02 DIAGNOSIS — Z8546 Personal history of malignant neoplasm of prostate: Secondary | ICD-10-CM | POA: Diagnosis not present

## 2022-02-02 DIAGNOSIS — I2510 Atherosclerotic heart disease of native coronary artery without angina pectoris: Secondary | ICD-10-CM | POA: Diagnosis not present

## 2022-02-02 DIAGNOSIS — Z0000 Encounter for general adult medical examination without abnormal findings: Secondary | ICD-10-CM | POA: Diagnosis not present

## 2022-02-04 ENCOUNTER — Encounter (HOSPITAL_COMMUNITY): Payer: Self-pay | Admitting: Oncology

## 2022-02-05 ENCOUNTER — Encounter: Payer: Self-pay | Admitting: Nurse Practitioner

## 2022-02-05 ENCOUNTER — Inpatient Hospital Stay (HOSPITAL_BASED_OUTPATIENT_CLINIC_OR_DEPARTMENT_OTHER): Payer: Medicare Other | Admitting: Nurse Practitioner

## 2022-02-05 ENCOUNTER — Encounter: Payer: Self-pay | Admitting: *Deleted

## 2022-02-05 ENCOUNTER — Other Ambulatory Visit: Payer: Self-pay

## 2022-02-05 ENCOUNTER — Inpatient Hospital Stay: Payer: Medicare Other

## 2022-02-05 VITALS — BP 112/62 | HR 63 | Temp 98.5°F | Resp 18

## 2022-02-05 VITALS — BP 128/68 | HR 65 | Temp 98.1°F | Resp 20 | Ht 72.0 in | Wt 195.0 lb

## 2022-02-05 DIAGNOSIS — E8581 Light chain (AL) amyloidosis: Secondary | ICD-10-CM

## 2022-02-05 DIAGNOSIS — Z7901 Long term (current) use of anticoagulants: Secondary | ICD-10-CM | POA: Diagnosis not present

## 2022-02-05 DIAGNOSIS — Z86711 Personal history of pulmonary embolism: Secondary | ICD-10-CM | POA: Diagnosis not present

## 2022-02-05 DIAGNOSIS — Z8546 Personal history of malignant neoplasm of prostate: Secondary | ICD-10-CM | POA: Diagnosis not present

## 2022-02-05 DIAGNOSIS — Z79899 Other long term (current) drug therapy: Secondary | ICD-10-CM | POA: Diagnosis not present

## 2022-02-05 DIAGNOSIS — E859 Amyloidosis, unspecified: Secondary | ICD-10-CM | POA: Diagnosis not present

## 2022-02-05 DIAGNOSIS — Z86718 Personal history of other venous thrombosis and embolism: Secondary | ICD-10-CM | POA: Diagnosis not present

## 2022-02-05 LAB — CBC WITH DIFFERENTIAL (CANCER CENTER ONLY)
Abs Immature Granulocytes: 0.02 10*3/uL (ref 0.00–0.07)
Basophils Absolute: 0 10*3/uL (ref 0.0–0.1)
Basophils Relative: 0 %
Eosinophils Absolute: 0.2 10*3/uL (ref 0.0–0.5)
Eosinophils Relative: 2 %
HCT: 38.2 % — ABNORMAL LOW (ref 39.0–52.0)
Hemoglobin: 12.5 g/dL — ABNORMAL LOW (ref 13.0–17.0)
Immature Granulocytes: 0 %
Lymphocytes Relative: 20 %
Lymphs Abs: 1.7 10*3/uL (ref 0.7–4.0)
MCH: 32.1 pg (ref 26.0–34.0)
MCHC: 32.7 g/dL (ref 30.0–36.0)
MCV: 97.9 fL (ref 80.0–100.0)
Monocytes Absolute: 0.8 10*3/uL (ref 0.1–1.0)
Monocytes Relative: 9 %
Neutro Abs: 6.1 10*3/uL (ref 1.7–7.7)
Neutrophils Relative %: 69 %
Platelet Count: 143 10*3/uL — ABNORMAL LOW (ref 150–400)
RBC: 3.9 MIL/uL — ABNORMAL LOW (ref 4.22–5.81)
RDW: 13.9 % (ref 11.5–15.5)
WBC Count: 8.8 10*3/uL (ref 4.0–10.5)
nRBC: 0 % (ref 0.0–0.2)

## 2022-02-05 MED ORDER — DIPHENHYDRAMINE HCL 25 MG PO CAPS
50.0000 mg | ORAL_CAPSULE | Freq: Once | ORAL | Status: AC
  Administered 2022-02-05: 50 mg via ORAL
  Filled 2022-02-05: qty 2

## 2022-02-05 MED ORDER — DARATUMUMAB-HYALURONIDASE-FIHJ 1800-30000 MG-UT/15ML ~~LOC~~ SOLN
1800.0000 mg | Freq: Once | SUBCUTANEOUS | Status: AC
  Administered 2022-02-05: 1800 mg via SUBCUTANEOUS
  Filled 2022-02-05: qty 15

## 2022-02-05 MED ORDER — ACETAMINOPHEN 325 MG PO TABS
650.0000 mg | ORAL_TABLET | Freq: Once | ORAL | Status: AC
  Administered 2022-02-05: 650 mg via ORAL
  Filled 2022-02-05: qty 2

## 2022-02-05 MED ORDER — MONTELUKAST SODIUM 10 MG PO TABS
10.0000 mg | ORAL_TABLET | Freq: Once | ORAL | Status: AC
  Administered 2022-02-05: 10 mg via ORAL
  Filled 2022-02-05: qty 1

## 2022-02-05 MED ORDER — DEXAMETHASONE 4 MG PO TABS
20.0000 mg | ORAL_TABLET | Freq: Once | ORAL | Status: AC
  Administered 2022-02-05: 20 mg via ORAL
  Filled 2022-02-05: qty 5

## 2022-02-05 NOTE — Progress Notes (Signed)
Patient seen by Ned Card NP today ? ?Vitals are within treatment parameters. ? ?Labs reviewed by Ned Card NP and are within treatment parameters. ?No CMP required. ? ?Per physician team, patient is ready for treatment and there are NO modifications to the treatment plan.  ?

## 2022-02-05 NOTE — Patient Instructions (Addendum)
[facility]   ?Discharge Instructions: ?Thank you for choosing [facility] to provide your oncology and hematology care.  ? ?If you have a lab appointment with the [facility], please go directly to the [facility] and check in at the registration area. ?  ?Wear comfortable clothing and clothing appropriate for easy access to any Portacath or PICC line.  ? ?We strive to give you quality time with your provider. You may need to reschedule your appointment if you arrive late (15 or more minutes).  Arriving late affects you and other patients whose appointments are after yours.  Also, if you miss three or more appointments without notifying the office, you may be dismissed from the clinic at the provider?s discretion.    ?  ?For prescription refill requests, have your pharmacy contact our office and allow 72 hours for refills to be completed.   ? ?Today you received the following chemotherapy and/or immunotherapy agents Daratumumab-hyaluronidase-fihj (DARZALEX FASPRO).    ?  ?To help prevent nausea and vomiting after your treatment, we encourage you to take your nausea medication as directed. ? ?BELOW ARE SYMPTOMS THAT SHOULD BE REPORTED IMMEDIATELY: ?*FEVER GREATER THAN 100.4 F (38 ?C) OR HIGHER ?*CHILLS OR SWEATING ?*NAUSEA AND VOMITING THAT IS NOT CONTROLLED WITH YOUR NAUSEA MEDICATION ?*UNUSUAL SHORTNESS OF BREATH ?*UNUSUAL BRUISING OR BLEEDING ?*URINARY PROBLEMS (pain or burning when urinating, or frequent urination) ?*BOWEL PROBLEMS (unusual diarrhea, constipation, pain near the anus) ?TENDERNESS IN MOUTH AND THROAT WITH OR WITHOUT PRESENCE OF ULCERS (sore throat, sores in mouth, or a toothache) ?UNUSUAL RASH, SWELLING OR PAIN  ?UNUSUAL VAGINAL DISCHARGE OR ITCHING  ? ?Items with * indicate a potential emergency and should be followed up as soon as possible or go to the Emergency Department if any problems should occur. ? ?Please show the CHEMOTHERAPY ALERT CARD or  IMMUNOTHERAPY ALERT CARD at check-in to the Emergency Department and triage nurse. ? ?Should you have questions after your visit or need to cancel or reschedule your appointment, please contact [facility]  Dept: [phone]  and follow the prompts.  Office hours are 8:00 a.m. to 4:30 p.m. Monday - Friday. Please note that voicemails left after 4:00 p.m. may not be returned until the following business day.  We are closed weekends and major holidays. You have access to a nurse at all times for urgent questions. Please call the main number to the clinic Dept: [phone] and follow the prompts. ? ? ?For any non-urgent questions, you may also contact your provider using MyChart. We now offer e-Visits for anyone 77 and older to request care online for non-urgent symptoms. For details visit [email or web address]. ?  ?Also download the MyChart app! Go to the app store, search "MyChart", open the app, select [facility], and log in with your MyChart username and password. ? ?Due to Covid, a mask is required upon entering the hospital/clinic. If you do not have a mask, one will be given to you upon arrival. For doctor visits, patients may have 1 support person aged 77 or older with them. For treatment visits, patients cannot have anyone with them due to current Covid guidelines and our immunocompromised population.  ? ?Daratumumab; Hyaluronidase Injection ?What is this medication? ?DARATUMUMAB; HYALURONIDASE (dar a toom ue mab / hye al ur ON i dase) is a monoclonal antibody. Hyaluronidase is used to improve the effects of daratumumab. It treats certain types of cancer. Some of the cancers treated are  multiple myeloma and light-chain amyloidosis. ?This medicine may be used for other purposes; ask your health care provider or pharmacist if you have questions. ?COMMON BRAND NAME(S): DARZALEX FASPRO ?What should I tell my care team before I take this medication? ?They need to know if you have any  of these conditions: ?heart disease ?infection especially a viral infection such as chickenpox, cold sores, herpes, or hepatitis B ?lung or breathing disease ?an unusual or allergic reaction to daratumumab, hyaluronidase, other medicines, foods, dyes, or preservatives ?pregnant or trying to get pregnant ?breast-feeding ?How should I use this medication? ?This medicine is for injection under the skin. It is given by a health care professional in a hospital or clinic setting. ?Talk to your pediatrician regarding the use of this medicine in children. Special care may be needed. ?Overdosage: If you think you have taken too much of this medicine contact a poison control center or emergency room at once. ?NOTE: This medicine is only for you. Do not share this medicine with others. ?What if I miss a dose? ?Keep appointments for follow-up doses as directed. It is important not to miss your dose. Call your doctor or health care professional if you are unable to keep an appointment. ?What may interact with this medication? ?Interactions have not been studied. ?This list may not describe all possible interactions. Give your health care provider a list of all the medicines, herbs, non-prescription drugs, or dietary supplements you use. Also tell them if you smoke, drink alcohol, or use illegal drugs. Some items may interact with your medicine. ?What should I watch for while using this medication? ?Your condition will be monitored carefully while you are receiving this medicine. ?This medicine can cause serious allergic reactions. To reduce your risk, your health care provider may give you other medicine to take before receiving this one. Be sure to follow the directions from your health care provider. ?This medicine can affect the results of blood tests to match your blood type. These changes can last for up to 6 months after the final dose. Your healthcare provider will do blood tests to match your blood type before you start  treatment. Tell all of your healthcare providers that you are being treated with this medicine before receiving a blood transfusion. ?This medicine can affect the results of some tests used to determine treatment response; extra tests may be needed to evaluate response. ?Do not become pregnant while taking this medicine or for 3 months after stopping it. Women should inform their health care provider if they wish to become pregnant or think they might be pregnant. There is a potential for serious side effects to an unborn child. Talk to your health care provider for more information. Do not breast-feed an infant while taking this medicine. ?What side effects may I notice from receiving this medication? ?Side effects that you should report to your care team as soon as possible: ?Allergic reactions--skin rash, itching or hives, swelling of the face, lips, or tongue ?Blood clot--chest pain, shortness of breath, pain, swelling or warmth in the leg ?Blurred vision ?Fast, irregular heartbeat ?Infection--fever, chills, cough, sore throat, pain or trouble passing urine ?Injection reactions--dizziness, fast heartbeat, feeling faint or lightheaded, falls, headache, increase in blood pressure, nausea, vomiting, or wheezing or trouble breathing with loud or whistling sounds ?Low red blood cell counts--trouble breathing, feeling faint, lightheaded or falls, unusually weak or tired ?Unusual bleeding or bruising ?Side effects that usually do not require medical attention (report these to your care team  if they continue or are bothersome): ?Back pain ?Constipation ?Diarrhea ?Pain, tingling, numbness in the hands or feet ?Pain, redness, or irritation at site where injected ?Muscle cramp or pain ?Swelling of the ankles, feet, hands ?Tiredness ?Trouble sleeping ?This list may not describe all possible side effects. Call your doctor for medical advice about side effects. You may report side effects to FDA at 1-800-FDA-1088. ?Where  should I keep my medication? ?This drug is given in a hospital or clinic and will not be stored at home. ?NOTE: This sheet is a summary. It may not cover all possible information. If you have questions abo

## 2022-02-05 NOTE — Progress Notes (Signed)
Patient presents for treatment. RN assessment completed along with the following: ? ?Labs/vitals reviewed - Yes, and please see collab nurse note.    ?Weight within 10% of previous measurement - Yes ?Informed consent completed and reflects current therapy/intent - Yes, on date 01/28/2022             ?Provider progress note reviewed - Yes, today's provider note was reviewed. ?Treatment/Antibody/Supportive plan reviewed - Yes, and there are no adjustments needed for today's treatment. ?S&H and other orders reviewed - Yes, and there are no additional orders identified. ?Previous treatment date reviewed - Yes, and the appropriate amount of time has elapsed between treatments. ?Clinic Hand Off Received from - Yes' \\from'$  Susan-C, RN ? ?Patient to proceed with treatment.  ? ?

## 2022-02-05 NOTE — Progress Notes (Signed)
?[facility] ?OFFICE PROGRESS NOTE ? ? ?Diagnosis: Amyloidosis ? ?INTERVAL HISTORY:  ? ?Brent Allison returns as scheduled.  He began treatment with daratumumab 01/28/2022.  He felt "weak" for a day or so.  No shortness of breath or cough.  No wheezing.  He denies bleeding.  No nausea or vomiting.  No diarrhea. ? ?Objective: ? ?Vital signs in last 24 hours: ? ?Blood pressure 128/68, pulse 65, temperature 98.1 ?F (36.7 ?C), temperature source Oral, resp. rate 20, height 6' (1.829 m), weight 195 lb (88.5 kg), SpO2 100 %. ?  ? ?HEENT: No thrush or ulcers. ?Resp: Lungs clear bilaterally. ?Cardio: Regular rate and rhythm. ?GI: No hepatosplenomegaly. ?Vascular: No leg edema.  Right lower leg is slightly larger than the left lower leg. ? ? ? ?Lab Results: ? ?Lab Results  ?Component Value Date  ? WBC 8.8 02/05/2022  ? HGB 12.5 (L) 02/05/2022  ? HCT 38.2 (L) 02/05/2022  ? MCV 97.9 02/05/2022  ? PLT 143 (L) 02/05/2022  ? NEUTROABS 6.1 02/05/2022  ? ? ?Imaging: ? ?No results found. ? ?Medications: I have reviewed the patient's current medications. ? ?Assessment/Plan: ?Amyloid involving an eyelid biopsy 06/10/2011 ?"Bruising" at the eyelids and mouth: Likely related to amyloidosis, persistent. ?Numbness and loss of vibratory sense at the fingertips: This predated Velcade-based therapy, but worsened. Now much improved following cervical spine surgery. ?Elevated serum free lambda light chains. The lambda light chains were lower on November 16 and slightly higher on 12/14/2011. ?Lambda light chain proteinuria. ?Bone marrow plasmacytosis: Variable increase in plasma cells noted on the bone marrow biopsy 07/29/2011 with plasma cells estimated to represent between 4% and 20% of the cellular population. ?Remote history of prostate cancer. ?Sleep apnea. ?Dyslipidemia. ?Report of pneumonia on 2 occasions in 2011. ?Admission to a hospital in [place], [place] October 2012 with "pneumonia." A chest x-ray at [facility] on  November 2 was negative .   ?Low serum immunoglobulin G level. ?Plasma cell dyscrasia with associated amyloidosis.   ?Initiation of systemic therapy with Cytoxan, Velcade, and Decadron 08/12/2011. Cycle #2 was initiated on 09/16/2011. Cycle #3 was initiated on 10/15/2011. Velcade was placed on hold due to neuropathy. ?The serum free lambda light chains were decreased on October 08 2011. ?The serum free lambda light chains were slightly increased on 12/14/2011, lower on 12/29/2011 and 02/02/2012. ?Initiation of Revlimid/Decadron February 2013, cycle 2 started on 01/28/2012. ?High-dose melphalan chemotherapy followed by autologous stem cell infusion on 02/09/2013 ?Initiation of Velcade/Decadrontherapy 05/28/2013, he completed 2 cycles on a day 1, 4, 8, 11 schedule ?Improvement in the serum free lambda light chains following induction Velcade/Decadron   ?Initiation of weekly Velcade/Decadron on 07/16/2013, last treatment on 10/16/2013 ?Improvement in the serum free lambda light chains on 08/21/2013 and 09/18/2013 [facility]) ?Normal serum free lambda light chains on 12/13/2013 ?Mild increase of the serum free lambda light chains beginning January 2016-stable ? treatment on the Pronto study at [facility] beginning 12/17/2015, in  of study 11/23/2016 ?Started on phase 2b Prothena study-long-term safety/efficacy study 01/11/2017, discontinued May 2018  ?Doxycycline started 05/17/2017, changed to once daily August 2022 ?01/25/2022 bone marrow biopsy-hypercellular marrow with plasma cell neoplasm, 7%, plasma cells are predominantly small in size with occasional binucleation noted, no lymphocytosis, lambda predominant; 13q-/-13 detected, 1p32/1q21 detected, CCND1(BCL1)/IgH t(11;14) detected ?Daratumumab initiated 01/28/2022 ? ?15. Loss of "balance "and proximal motor weakness secondary to cervical stenosis-status post decompression surgery on 02/19/2012. Improved, but not resolved stenosis with mass effect on the spinal cord noted on a  repeat MRI 03/28/2012. He underwent further decompression surgery on 04/06/2012. The ataxia, weakness, and peripheral numbness is much improved.   ?16. Right lower Extremity deep vein thrombosis 02/02/2012-a Doppler ultrasound confirmed a gastrocnemius and peroneal deep vein thrombosis. An IVC filter was placed prior to surgery and he was maintained on Lovenox. The IVC filter was removed on 05/04/2012.   ?17. Anorexia following cervical spine surgery. Improved.   ?18. Episode of flank pain and hematuria in 03/31/2012-etiology unclear. He completed a course of antibiotics. The hematuria and pain have resolved.   ?19. Rectal wall thickening noted on a CT of the pelvis 03/31/2012.   ?20. colonoscopy showed an area of abnormality at the rectum with biopsy positive for amyloid.   ?21. Lung nodule noted on the CT 03/31/2012 , no lung nodule on a chest CT 07/30/2013   ?22. Lumbar stenosis-status post surgery on 07/31/2012.   ?23. Status post stem cell collection.   ?24.Rright internal jugular thrombosis, 10/22/2012, likely related to pheresis catheter and HIT   ?25. Admission with acute right pulmonary embolism 10/27/2012   ?26. Superior mesenteric vein/portal vein thrombosis diagnosed on a CT of the abdomen 10/28/2012 and 10/29/2012-treated with Argatroban , maintained on Coumadin anticoagulation until he hospital admission March 2014 for stem cell therapy   ?27. HIT confirmed on a serotonin release assay 10/29/2012   ?28. History of Renal insufficiency during the December 2013 hospital admission-likely related to dehydration and? Contrast nephropathy , persistent mild elevation of the creatinine   ?29. Dysphagia-? Secondary to amyloidosis, reported when he was here on 05/08/2013. He did not complain of dysphagia today   ?30. Anemia secondary to multiple myeloma and chemotherapy , improved   ?31.right lower Extremity deep vein thrombosis diagnosed at [facility] July 2014 , repeat Doppler at [facility] 07/19/2013 with chronic thrombus  in the right gastrocnemius, Eliquis has been discontinued   ?32. Fall August 2014 with a left shoulder injury, diagnosed with a rotator cuff tear, status post surgical repair 09/19/2013   ?33. 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 The neuropathy symptoms have partially improved since Velcade was discontinued after treatment 10/16/2013   ?34. Cardiac MRI 05/22/2015-normal left ventricular size with severe concentric hypertrophy and mildly impaired systolic function, HBZJ-[id number]%, right ventricular hypertrophy, diffuse late gadolinium enhancement in the left and right ventricles-findings consistent with cardiac amyloidosis. Echocardiogram at [facility]-LVEF 50% ?Echocardiogram at [facility] 04/20/2018 - left ventricular hypertrophy, LVEF 55-60% ?Echocardiogram at [facility] 08/28/2019-left ventricular ejection fraction 55-60%, left ventricle normal in size with upper normal wall thickness ?35. Multinodular goiter confirmed on thyroid ultrasound at [facility] 06/29/2016, amyloidosis of the thyroid confirmed at [facility] 04/20/2018 ?  ? ?Disposition: Mr. Brandenburg appears stable.  He tolerated the first weekly daratumumab well.  Plan to proceed with week 2 today as scheduled. ? ?Dr. [name] reviewed the bone marrow results with him at today's visit. ? ?He will return for daratumumab in 1 week and 2 weeks.  He will be seen in follow-up in 3 weeks. ? ?Patient seen with Dr. [name]. ? ? ? ?Ned Card ANP/GNP-BC  ? ?02/05/2022  ?10:25 AM ?This was a shared visit with Ned Card.  We reviewed the bone marrow findings and treatment plan with Mr. Gavitt.He will continue daratumumab.  He is tolerating the daratumumab well to date ?I was present for greater than 50% of today's visit.  I performed medical decision making. ? ?Julieanne Manson, MD ? ? ? ? ? ? ?

## 2022-02-06 ENCOUNTER — Other Ambulatory Visit: Payer: Self-pay | Admitting: Oncology

## 2022-02-09 ENCOUNTER — Ambulatory Visit (HOSPITAL_COMMUNITY): Payer: Medicare Other

## 2022-02-12 ENCOUNTER — Inpatient Hospital Stay: Payer: Medicare Other

## 2022-02-12 ENCOUNTER — Other Ambulatory Visit: Payer: Self-pay

## 2022-02-12 VITALS — BP 123/73 | HR 100 | Temp 98.7°F | Resp 20 | Ht 72.0 in | Wt 194.0 lb

## 2022-02-12 DIAGNOSIS — E8581 Light chain (AL) amyloidosis: Secondary | ICD-10-CM

## 2022-02-12 DIAGNOSIS — Z86718 Personal history of other venous thrombosis and embolism: Secondary | ICD-10-CM | POA: Diagnosis not present

## 2022-02-12 DIAGNOSIS — E859 Amyloidosis, unspecified: Secondary | ICD-10-CM | POA: Diagnosis not present

## 2022-02-12 DIAGNOSIS — Z86711 Personal history of pulmonary embolism: Secondary | ICD-10-CM | POA: Diagnosis not present

## 2022-02-12 DIAGNOSIS — Z8546 Personal history of malignant neoplasm of prostate: Secondary | ICD-10-CM | POA: Diagnosis not present

## 2022-02-12 DIAGNOSIS — Z7901 Long term (current) use of anticoagulants: Secondary | ICD-10-CM | POA: Diagnosis not present

## 2022-02-12 DIAGNOSIS — Z79899 Other long term (current) drug therapy: Secondary | ICD-10-CM | POA: Diagnosis not present

## 2022-02-12 LAB — CBC WITH DIFFERENTIAL (CANCER CENTER ONLY)
Abs Immature Granulocytes: 0.02 10*3/uL (ref 0.00–0.07)
Basophils Absolute: 0 10*3/uL (ref 0.0–0.1)
Basophils Relative: 0 %
Eosinophils Absolute: 0.1 10*3/uL (ref 0.0–0.5)
Eosinophils Relative: 2 %
HCT: 39.3 % (ref 39.0–52.0)
Hemoglobin: 12.7 g/dL — ABNORMAL LOW (ref 13.0–17.0)
Immature Granulocytes: 0 %
Lymphocytes Relative: 23 %
Lymphs Abs: 1.7 10*3/uL (ref 0.7–4.0)
MCH: 31.8 pg (ref 26.0–34.0)
MCHC: 32.3 g/dL (ref 30.0–36.0)
MCV: 98.5 fL (ref 80.0–100.0)
Monocytes Absolute: 0.7 10*3/uL (ref 0.1–1.0)
Monocytes Relative: 10 %
Neutro Abs: 4.8 10*3/uL (ref 1.7–7.7)
Neutrophils Relative %: 65 %
Platelet Count: 150 10*3/uL (ref 150–400)
RBC: 3.99 MIL/uL — ABNORMAL LOW (ref 4.22–5.81)
RDW: 14.2 % (ref 11.5–15.5)
WBC Count: 7.3 10*3/uL (ref 4.0–10.5)
nRBC: 0 % (ref 0.0–0.2)

## 2022-02-12 MED ORDER — MONTELUKAST SODIUM 10 MG PO TABS
10.0000 mg | ORAL_TABLET | Freq: Once | ORAL | Status: AC
  Administered 2022-02-12: 10 mg via ORAL
  Filled 2022-02-12: qty 1

## 2022-02-12 MED ORDER — ACETAMINOPHEN 325 MG PO TABS
650.0000 mg | ORAL_TABLET | Freq: Once | ORAL | Status: AC
  Administered 2022-02-12: 650 mg via ORAL
  Filled 2022-02-12: qty 2

## 2022-02-12 MED ORDER — DEXAMETHASONE 4 MG PO TABS
20.0000 mg | ORAL_TABLET | Freq: Once | ORAL | Status: AC
  Administered 2022-02-12: 20 mg via ORAL
  Filled 2022-02-12: qty 5

## 2022-02-12 MED ORDER — DIPHENHYDRAMINE HCL 25 MG PO CAPS
50.0000 mg | ORAL_CAPSULE | Freq: Once | ORAL | Status: AC
  Administered 2022-02-12: 50 mg via ORAL
  Filled 2022-02-12: qty 2

## 2022-02-12 MED ORDER — DARATUMUMAB-HYALURONIDASE-FIHJ 1800-30000 MG-UT/15ML ~~LOC~~ SOLN
1800.0000 mg | Freq: Once | SUBCUTANEOUS | Status: AC
  Administered 2022-02-12: 1800 mg via SUBCUTANEOUS
  Filled 2022-02-12: qty 15

## 2022-02-12 NOTE — Patient Instructions (Signed)
[facility]   ?Discharge Instructions: ?Thank you for choosing [facility] to provide your oncology and hematology care.  ? ?If you have a lab appointment with the [facility], please go directly to the [facility] and check in at the registration area. ?  ?Wear comfortable clothing and clothing appropriate for easy access to any Portacath or PICC line.  ? ?We strive to give you quality time with your provider. You may need to reschedule your appointment if you arrive late (15 or more minutes).  Arriving late affects you and other patients whose appointments are after yours.  Also, if you miss three or more appointments without notifying the office, you may be dismissed from the clinic at the provider?s discretion.    ?  ?For prescription refill requests, have your pharmacy contact our office and allow 72 hours for refills to be completed.   ? ?Today you received the following chemotherapy and/or immunotherapy agents Daratumumab-hyaluronidase-fihj (DARZALEX FASPRO).    ?  ?To help prevent nausea and vomiting after your treatment, we encourage you to take your nausea medication as directed. ? ?BELOW ARE SYMPTOMS THAT SHOULD BE REPORTED IMMEDIATELY: ?*FEVER GREATER THAN 100.4 F (38 ?C) OR HIGHER ?*CHILLS OR SWEATING ?*NAUSEA AND VOMITING THAT IS NOT CONTROLLED WITH YOUR NAUSEA MEDICATION ?*UNUSUAL SHORTNESS OF BREATH ?*UNUSUAL BRUISING OR BLEEDING ?*URINARY PROBLEMS (pain or burning when urinating, or frequent urination) ?*BOWEL PROBLEMS (unusual diarrhea, constipation, pain near the anus) ?TENDERNESS IN MOUTH AND THROAT WITH OR WITHOUT PRESENCE OF ULCERS (sore throat, sores in mouth, or a toothache) ?UNUSUAL RASH, SWELLING OR PAIN  ?UNUSUAL VAGINAL DISCHARGE OR ITCHING  ? ?Items with * indicate a potential emergency and should be followed up as soon as possible or go to the Emergency Department if any problems should occur. ? ?Please show the CHEMOTHERAPY ALERT CARD or  IMMUNOTHERAPY ALERT CARD at check-in to the Emergency Department and triage nurse. ? ?Should you have questions after your visit or need to cancel or reschedule your appointment, please contact [facility]  Dept: [phone]  and follow the prompts.  Office hours are 8:00 a.m. to 4:30 p.m. Monday - Friday. Please note that voicemails left after 4:00 p.m. may not be returned until the following business day.  We are closed weekends and major holidays. You have access to a nurse at all times for urgent questions. Please call the main number to the clinic Dept: [phone] and follow the prompts. ? ? ?For any non-urgent questions, you may also contact your provider using MyChart. We now offer e-Visits for anyone 66 and older to request care online for non-urgent symptoms. For details visit [email or web address]. ?  ?Also download the MyChart app! Go to the app store, search "MyChart", open the app, select [facility], and log in with your MyChart username and password. ? ?Due to Covid, a mask is required upon entering the hospital/clinic. If you do not have a mask, one will be given to you upon arrival. For doctor visits, patients may have 1 support person aged 78 or older with them. For treatment visits, patients cannot have anyone with them due to current Covid guidelines and our immunocompromised population.  ? ?Daratumumab; Hyaluronidase Injection ?What is this medication? ?DARATUMUMAB; HYALURONIDASE (dar a toom ue mab / hye al ur ON i dase) is a monoclonal antibody. Hyaluronidase is used to improve the effects of daratumumab. It treats certain types of cancer. Some of the cancers treated are  multiple myeloma and light-chain amyloidosis. ?This medicine may be used for other purposes; ask your health care provider or pharmacist if you have questions. ?COMMON BRAND NAME(S): DARZALEX FASPRO ?What should I tell my care team before I take this medication? ?They need to know if you have any  of these conditions: ?heart disease ?infection especially a viral infection such as chickenpox, cold sores, herpes, or hepatitis B ?lung or breathing disease ?an unusual or allergic reaction to daratumumab, hyaluronidase, other medicines, foods, dyes, or preservatives ?pregnant or trying to get pregnant ?breast-feeding ?How should I use this medication? ?This medicine is for injection under the skin. It is given by a health care professional in a hospital or clinic setting. ?Talk to your pediatrician regarding the use of this medicine in children. Special care may be needed. ?Overdosage: If you think you have taken too much of this medicine contact a poison control center or emergency room at once. ?NOTE: This medicine is only for you. Do not share this medicine with others. ?What if I miss a dose? ?Keep appointments for follow-up doses as directed. It is important not to miss your dose. Call your doctor or health care professional if you are unable to keep an appointment. ?What may interact with this medication? ?Interactions have not been studied. ?This list may not describe all possible interactions. Give your health care provider a list of all the medicines, herbs, non-prescription drugs, or dietary supplements you use. Also tell them if you smoke, drink alcohol, or use illegal drugs. Some items may interact with your medicine. ?What should I watch for while using this medication? ?Your condition will be monitored carefully while you are receiving this medicine. ?This medicine can cause serious allergic reactions. To reduce your risk, your health care provider may give you other medicine to take before receiving this one. Be sure to follow the directions from your health care provider. ?This medicine can affect the results of blood tests to match your blood type. These changes can last for up to 6 months after the final dose. Your healthcare provider will do blood tests to match your blood type before you start  treatment. Tell all of your healthcare providers that you are being treated with this medicine before receiving a blood transfusion. ?This medicine can affect the results of some tests used to determine treatment response; extra tests may be needed to evaluate response. ?Do not become pregnant while taking this medicine or for 3 months after stopping it. Women should inform their health care provider if they wish to become pregnant or think they might be pregnant. There is a potential for serious side effects to an unborn child. Talk to your health care provider for more information. Do not breast-feed an infant while taking this medicine. ?What side effects may I notice from receiving this medication? ?Side effects that you should report to your care team as soon as possible: ?Allergic reactions--skin rash, itching or hives, swelling of the face, lips, or tongue ?Blood clot--chest pain, shortness of breath, pain, swelling or warmth in the leg ?Blurred vision ?Fast, irregular heartbeat ?Infection--fever, chills, cough, sore throat, pain or trouble passing urine ?Injection reactions--dizziness, fast heartbeat, feeling faint or lightheaded, falls, headache, increase in blood pressure, nausea, vomiting, or wheezing or trouble breathing with loud or whistling sounds ?Low red blood cell counts--trouble breathing, feeling faint, lightheaded or falls, unusually weak or tired ?Unusual bleeding or bruising ?Side effects that usually do not require medical attention (report these to your care team  if they continue or are bothersome): ?Back pain ?Constipation ?Diarrhea ?Pain, tingling, numbness in the hands or feet ?Pain, redness, or irritation at site where injected ?Muscle cramp or pain ?Swelling of the ankles, feet, hands ?Tiredness ?Trouble sleeping ?This list may not describe all possible side effects. Call your doctor for medical advice about side effects. You may report side effects to FDA at 1-800-FDA-1088. ?Where  should I keep my medication? ?This drug is given in a hospital or clinic and will not be stored at home. ?NOTE: This sheet is a summary. It may not cover all possible information. If you have questions abo

## 2022-02-12 NOTE — Progress Notes (Signed)
Patient presents for treatment. RN assessment completed along with the following: ? ?Labs/vitals reviewed - Yes, and within treatment parameters.   ?Weight within 10% of previous measurement - Yes ?Informed consent completed and reflects current therapy/intent - Yes, on date 01/28/2022             ?Provider progress note reviewed - Patient not seen by provider today. Most recent note dated 02/05/2022 reviewed. ?Treatment/Antibody/Supportive plan reviewed - Yes, and there are no adjustments needed for today's treatment. ?S&H and other orders reviewed - Yes, and there are no additional orders identified. ?Previous treatment date reviewed - Yes, and the appropriate amount of time has elapsed between treatments. ?Clinic Hand Off Received from - No. ? ?Patient to proceed with treatment.  ? ?

## 2022-02-13 ENCOUNTER — Other Ambulatory Visit: Payer: Self-pay | Admitting: Oncology

## 2022-02-17 ENCOUNTER — Inpatient Hospital Stay: Payer: Medicare Other

## 2022-02-17 ENCOUNTER — Other Ambulatory Visit: Payer: Self-pay

## 2022-02-17 VITALS — BP 121/78 | HR 70 | Temp 98.8°F | Resp 20 | Ht 72.0 in | Wt 193.0 lb

## 2022-02-17 DIAGNOSIS — Z79899 Other long term (current) drug therapy: Secondary | ICD-10-CM | POA: Diagnosis not present

## 2022-02-17 DIAGNOSIS — Z86718 Personal history of other venous thrombosis and embolism: Secondary | ICD-10-CM | POA: Diagnosis not present

## 2022-02-17 DIAGNOSIS — E859 Amyloidosis, unspecified: Secondary | ICD-10-CM | POA: Diagnosis not present

## 2022-02-17 DIAGNOSIS — Z86711 Personal history of pulmonary embolism: Secondary | ICD-10-CM | POA: Diagnosis not present

## 2022-02-17 DIAGNOSIS — Z8546 Personal history of malignant neoplasm of prostate: Secondary | ICD-10-CM | POA: Diagnosis not present

## 2022-02-17 DIAGNOSIS — E8581 Light chain (AL) amyloidosis: Secondary | ICD-10-CM

## 2022-02-17 DIAGNOSIS — Z7901 Long term (current) use of anticoagulants: Secondary | ICD-10-CM | POA: Diagnosis not present

## 2022-02-17 LAB — CMP (CANCER CENTER ONLY)
ALT: 17 U/L (ref 0–44)
AST: 15 U/L (ref 15–41)
Albumin: 3.6 g/dL (ref 3.5–5.0)
Alkaline Phosphatase: 83 U/L (ref 38–126)
Anion gap: 8 (ref 5–15)
BUN: 22 mg/dL (ref 8–23)
CO2: 26 mmol/L (ref 22–32)
Calcium: 9 mg/dL (ref 8.9–10.3)
Chloride: 104 mmol/L (ref 98–111)
Creatinine: 1.45 mg/dL — ABNORMAL HIGH (ref 0.61–1.24)
GFR, Estimated: 50 mL/min — ABNORMAL LOW (ref >60)
Glucose, Bld: 89 mg/dL (ref 70–99)
Potassium: 4 mmol/L (ref 3.5–5.1)
Sodium: 138 mmol/L (ref 135–145)
Total Bilirubin: 0.5 mg/dL (ref 0.3–1.2)
Total Protein: 6.1 g/dL — ABNORMAL LOW (ref 6.5–8.1)

## 2022-02-17 LAB — CBC WITH DIFFERENTIAL (CANCER CENTER ONLY)
Abs Immature Granulocytes: 0.02 10*3/uL (ref 0.00–0.07)
Basophils Absolute: 0 10*3/uL (ref 0.0–0.1)
Basophils Relative: 0 %
Eosinophils Absolute: 0.1 10*3/uL (ref 0.0–0.5)
Eosinophils Relative: 2 %
HCT: 40.6 % (ref 39.0–52.0)
Hemoglobin: 13.3 g/dL (ref 13.0–17.0)
Immature Granulocytes: 0 %
Lymphocytes Relative: 24 %
Lymphs Abs: 1.7 10*3/uL (ref 0.7–4.0)
MCH: 31.8 pg (ref 26.0–34.0)
MCHC: 32.8 g/dL (ref 30.0–36.0)
MCV: 97.1 fL (ref 80.0–100.0)
Monocytes Absolute: 0.6 10*3/uL (ref 0.1–1.0)
Monocytes Relative: 9 %
Neutro Abs: 4.6 10*3/uL (ref 1.7–7.7)
Neutrophils Relative %: 65 %
Platelet Count: 161 10*3/uL (ref 150–400)
RBC: 4.18 MIL/uL — ABNORMAL LOW (ref 4.22–5.81)
RDW: 13.8 % (ref 11.5–15.5)
WBC Count: 7.1 10*3/uL (ref 4.0–10.5)
nRBC: 0 % (ref 0.0–0.2)

## 2022-02-17 MED ORDER — DARATUMUMAB-HYALURONIDASE-FIHJ 1800-30000 MG-UT/15ML ~~LOC~~ SOLN
1800.0000 mg | Freq: Once | SUBCUTANEOUS | Status: AC
  Administered 2022-02-17: 1800 mg via SUBCUTANEOUS
  Filled 2022-02-17: qty 15

## 2022-02-17 MED ORDER — DEXAMETHASONE 4 MG PO TABS
20.0000 mg | ORAL_TABLET | Freq: Once | ORAL | Status: AC
  Administered 2022-02-17: 20 mg via ORAL
  Filled 2022-02-17: qty 5

## 2022-02-17 MED ORDER — ACETAMINOPHEN 325 MG PO TABS
650.0000 mg | ORAL_TABLET | Freq: Once | ORAL | Status: AC
  Administered 2022-02-17: 650 mg via ORAL
  Filled 2022-02-17: qty 2

## 2022-02-17 MED ORDER — DIPHENHYDRAMINE HCL 25 MG PO CAPS
50.0000 mg | ORAL_CAPSULE | Freq: Once | ORAL | Status: AC
  Administered 2022-02-17: 50 mg via ORAL
  Filled 2022-02-17: qty 2

## 2022-02-17 NOTE — Progress Notes (Signed)
Patient presents for treatment. RN assessment completed along with the following: ? ?Labs/vitals reviewed - Yes, and within treatment parameters.   ?Weight within 10% of previous measurement - Yes ?Informed consent completed and reflects current therapy/intent - Yes, on date 01/28/2022             ?Provider progress note reviewed - Patient not seen by provider today. Most recent note dated 02/05/2022 reviewed. ?Treatment/Antibody/Supportive plan reviewed - Yes, and there are no adjustments needed for today's treatment. ?S&H and other orders reviewed - Yes, and there are no additional orders identified. ?Previous treatment date reviewed - Yes, and the appropriate amount of time has elapsed between treatments. ?Clinic Hand Off Received from - No. ? ?Patient to proceed with treatment.  ? ?

## 2022-02-17 NOTE — Patient Instructions (Signed)
[facility]   ?Discharge Instructions: ?Thank you for choosing [facility] to provide your oncology and hematology care.  ? ?If you have a lab appointment with the [facility], please go directly to the [facility] and check in at the registration area. ?  ?Wear comfortable clothing and clothing appropriate for easy access to any Portacath or PICC line.  ? ?We strive to give you quality time with your provider. You may need to reschedule your appointment if you arrive late (15 or more minutes).  Arriving late affects you and other patients whose appointments are after yours.  Also, if you miss three or more appointments without notifying the office, you may be dismissed from the clinic at the provider?s discretion.    ?  ?For prescription refill requests, have your pharmacy contact our office and allow 72 hours for refills to be completed.   ? ?Today you received the following chemotherapy and/or immunotherapy agents Daratumumab-hyaluronidase-fihj (DARZALEX FASPRO).    ?  ?To help prevent nausea and vomiting after your treatment, we encourage you to take your nausea medication as directed. ? ?BELOW ARE SYMPTOMS THAT SHOULD BE REPORTED IMMEDIATELY: ?*FEVER GREATER THAN 100.4 F (38 ?C) OR HIGHER ?*CHILLS OR SWEATING ?*NAUSEA AND VOMITING THAT IS NOT CONTROLLED WITH YOUR NAUSEA MEDICATION ?*UNUSUAL SHORTNESS OF BREATH ?*UNUSUAL BRUISING OR BLEEDING ?*URINARY PROBLEMS (pain or burning when urinating, or frequent urination) ?*BOWEL PROBLEMS (unusual diarrhea, constipation, pain near the anus) ?TENDERNESS IN MOUTH AND THROAT WITH OR WITHOUT PRESENCE OF ULCERS (sore throat, sores in mouth, or a toothache) ?UNUSUAL RASH, SWELLING OR PAIN  ?UNUSUAL VAGINAL DISCHARGE OR ITCHING  ? ?Items with * indicate a potential emergency and should be followed up as soon as possible or go to the Emergency Department if any problems should occur. ? ?Please show the CHEMOTHERAPY ALERT CARD or  IMMUNOTHERAPY ALERT CARD at check-in to the Emergency Department and triage nurse. ? ?Should you have questions after your visit or need to cancel or reschedule your appointment, please contact [facility]  Dept: [phone]  and follow the prompts.  Office hours are 8:00 a.m. to 4:30 p.m. Monday - Friday. Please note that voicemails left after 4:00 p.m. may not be returned until the following business day.  We are closed weekends and major holidays. You have access to a nurse at all times for urgent questions. Please call the main number to the clinic Dept: [phone] and follow the prompts. ? ? ?For any non-urgent questions, you may also contact your provider using MyChart. We now offer e-Visits for anyone 66 and older to request care online for non-urgent symptoms. For details visit [email or web address]. ?  ?Also download the MyChart app! Go to the app store, search "MyChart", open the app, select [facility], and log in with your MyChart username and password. ? ?Due to Covid, a mask is required upon entering the hospital/clinic. If you do not have a mask, one will be given to you upon arrival. For doctor visits, patients may have 1 support person aged 78 or older with them. For treatment visits, patients cannot have anyone with them due to current Covid guidelines and our immunocompromised population.  ? ?Daratumumab; Hyaluronidase Injection ?What is this medication? ?DARATUMUMAB; HYALURONIDASE (dar a toom ue mab / hye al ur ON i dase) is a monoclonal antibody. Hyaluronidase is used to improve the effects of daratumumab. It treats certain types of cancer. Some of the cancers treated are  multiple myeloma and light-chain amyloidosis. ?This medicine may be used for other purposes; ask your health care provider or pharmacist if you have questions. ?COMMON BRAND NAME(S): DARZALEX FASPRO ?What should I tell my care team before I take this medication? ?They need to know if you have any  of these conditions: ?heart disease ?infection especially a viral infection such as chickenpox, cold sores, herpes, or hepatitis B ?lung or breathing disease ?an unusual or allergic reaction to daratumumab, hyaluronidase, other medicines, foods, dyes, or preservatives ?pregnant or trying to get pregnant ?breast-feeding ?How should I use this medication? ?This medicine is for injection under the skin. It is given by a health care professional in a hospital or clinic setting. ?Talk to your pediatrician regarding the use of this medicine in children. Special care may be needed. ?Overdosage: If you think you have taken too much of this medicine contact a poison control center or emergency room at once. ?NOTE: This medicine is only for you. Do not share this medicine with others. ?What if I miss a dose? ?Keep appointments for follow-up doses as directed. It is important not to miss your dose. Call your doctor or health care professional if you are unable to keep an appointment. ?What may interact with this medication? ?Interactions have not been studied. ?This list may not describe all possible interactions. Give your health care provider a list of all the medicines, herbs, non-prescription drugs, or dietary supplements you use. Also tell them if you smoke, drink alcohol, or use illegal drugs. Some items may interact with your medicine. ?What should I watch for while using this medication? ?Your condition will be monitored carefully while you are receiving this medicine. ?This medicine can cause serious allergic reactions. To reduce your risk, your health care provider may give you other medicine to take before receiving this one. Be sure to follow the directions from your health care provider. ?This medicine can affect the results of blood tests to match your blood type. These changes can last for up to 6 months after the final dose. Your healthcare provider will do blood tests to match your blood type before you start  treatment. Tell all of your healthcare providers that you are being treated with this medicine before receiving a blood transfusion. ?This medicine can affect the results of some tests used to determine treatment response; extra tests may be needed to evaluate response. ?Do not become pregnant while taking this medicine or for 3 months after stopping it. Women should inform their health care provider if they wish to become pregnant or think they might be pregnant. There is a potential for serious side effects to an unborn child. Talk to your health care provider for more information. Do not breast-feed an infant while taking this medicine. ?What side effects may I notice from receiving this medication? ?Side effects that you should report to your care team as soon as possible: ?Allergic reactions--skin rash, itching or hives, swelling of the face, lips, or tongue ?Blood clot--chest pain, shortness of breath, pain, swelling or warmth in the leg ?Blurred vision ?Fast, irregular heartbeat ?Infection--fever, chills, cough, sore throat, pain or trouble passing urine ?Injection reactions--dizziness, fast heartbeat, feeling faint or lightheaded, falls, headache, increase in blood pressure, nausea, vomiting, or wheezing or trouble breathing with loud or whistling sounds ?Low red blood cell counts--trouble breathing, feeling faint, lightheaded or falls, unusually weak or tired ?Unusual bleeding or bruising ?Side effects that usually do not require medical attention (report these to your care team  if they continue or are bothersome): ?Back pain ?Constipation ?Diarrhea ?Pain, tingling, numbness in the hands or feet ?Pain, redness, or irritation at site where injected ?Muscle cramp or pain ?Swelling of the ankles, feet, hands ?Tiredness ?Trouble sleeping ?This list may not describe all possible side effects. Call your doctor for medical advice about side effects. You may report side effects to FDA at 1-800-FDA-1088. ?Where  should I keep my medication? ?This drug is given in a hospital or clinic and will not be stored at home. ?NOTE: This sheet is a summary. It may not cover all possible information. If you have questions abo

## 2022-02-18 LAB — SURGICAL PATHOLOGY

## 2022-02-20 ENCOUNTER — Other Ambulatory Visit: Payer: Self-pay | Admitting: Oncology

## 2022-02-26 ENCOUNTER — Inpatient Hospital Stay: Payer: Medicare Other

## 2022-02-26 ENCOUNTER — Inpatient Hospital Stay (HOSPITAL_BASED_OUTPATIENT_CLINIC_OR_DEPARTMENT_OTHER): Payer: Medicare Other | Admitting: Oncology

## 2022-02-26 ENCOUNTER — Encounter: Payer: Self-pay | Admitting: *Deleted

## 2022-02-26 ENCOUNTER — Inpatient Hospital Stay: Payer: Medicare Other | Attending: Oncology

## 2022-02-26 VITALS — BP 115/63 | HR 75 | Temp 97.9°F | Resp 20 | Wt 191.0 lb

## 2022-02-26 DIAGNOSIS — E859 Amyloidosis, unspecified: Secondary | ICD-10-CM | POA: Diagnosis not present

## 2022-02-26 DIAGNOSIS — Z86711 Personal history of pulmonary embolism: Secondary | ICD-10-CM | POA: Insufficient documentation

## 2022-02-26 DIAGNOSIS — E8581 Light chain (AL) amyloidosis: Secondary | ICD-10-CM

## 2022-02-26 DIAGNOSIS — Z79899 Other long term (current) drug therapy: Secondary | ICD-10-CM | POA: Insufficient documentation

## 2022-02-26 DIAGNOSIS — Z86718 Personal history of other venous thrombosis and embolism: Secondary | ICD-10-CM | POA: Diagnosis not present

## 2022-02-26 DIAGNOSIS — Z8546 Personal history of malignant neoplasm of prostate: Secondary | ICD-10-CM | POA: Diagnosis not present

## 2022-02-26 DIAGNOSIS — Z7901 Long term (current) use of anticoagulants: Secondary | ICD-10-CM | POA: Diagnosis not present

## 2022-02-26 LAB — CBC WITH DIFFERENTIAL (CANCER CENTER ONLY)
Abs Immature Granulocytes: 0.04 10*3/uL (ref 0.00–0.07)
Basophils Absolute: 0 10*3/uL (ref 0.0–0.1)
Basophils Relative: 0 %
Eosinophils Absolute: 0.1 10*3/uL (ref 0.0–0.5)
Eosinophils Relative: 1 %
HCT: 40.4 % (ref 39.0–52.0)
Hemoglobin: 13.3 g/dL (ref 13.0–17.0)
Immature Granulocytes: 1 %
Lymphocytes Relative: 24 %
Lymphs Abs: 2.1 10*3/uL (ref 0.7–4.0)
MCH: 32 pg (ref 26.0–34.0)
MCHC: 32.9 g/dL (ref 30.0–36.0)
MCV: 97.1 fL (ref 80.0–100.0)
Monocytes Absolute: 0.9 10*3/uL (ref 0.1–1.0)
Monocytes Relative: 11 %
Neutro Abs: 5.3 10*3/uL (ref 1.7–7.7)
Neutrophils Relative %: 63 %
Platelet Count: 168 10*3/uL (ref 150–400)
RBC: 4.16 MIL/uL — ABNORMAL LOW (ref 4.22–5.81)
RDW: 13.7 % (ref 11.5–15.5)
WBC Count: 8.4 10*3/uL (ref 4.0–10.5)
nRBC: 0 % (ref 0.0–0.2)

## 2022-02-26 MED ORDER — DEXAMETHASONE 4 MG PO TABS
20.0000 mg | ORAL_TABLET | Freq: Once | ORAL | Status: AC
  Administered 2022-02-26: 20 mg via ORAL
  Filled 2022-02-26: qty 5

## 2022-02-26 MED ORDER — DARATUMUMAB-HYALURONIDASE-FIHJ 1800-30000 MG-UT/15ML ~~LOC~~ SOLN
1800.0000 mg | Freq: Once | SUBCUTANEOUS | Status: AC
  Administered 2022-02-26: 1800 mg via SUBCUTANEOUS
  Filled 2022-02-26: qty 15

## 2022-02-26 MED ORDER — ACETAMINOPHEN 325 MG PO TABS
650.0000 mg | ORAL_TABLET | Freq: Once | ORAL | Status: AC
  Administered 2022-02-26: 650 mg via ORAL
  Filled 2022-02-26: qty 2

## 2022-02-26 MED ORDER — DIPHENHYDRAMINE HCL 25 MG PO CAPS
50.0000 mg | ORAL_CAPSULE | Freq: Once | ORAL | Status: AC
  Administered 2022-02-26: 50 mg via ORAL
  Filled 2022-02-26: qty 2

## 2022-02-26 NOTE — Progress Notes (Signed)
?[place] ?OFFICE PROGRESS NOTE ? ? ?Diagnosis: Amyloidosis ? ?INTERVAL HISTORY:  ? ?[name] has completed 4 weekly treatments with daratumumab.  No rash, cough, shortness of breath.  He reports malaise.  He has increased energy for a few hours after receiving Decadron.  Leg strength has improved following the cervical spine surgery. ? ?Objective: ? ?Vital signs in last 24 hours: ? ?Blood pressure 115/63, pulse 75, temperature 97.9 ?F (36.6 ?C), temperature source Tympanic, resp. rate 20, weight 191 lb (86.6 kg), SpO2 100 %. ?  ? ?HEENT: No thrush ?Resp: Lungs clear bilaterally ?Cardio: Regular rate and rhythm, distant heart sounds ?GI: No hepatosplenomegaly ?Vascular: No leg edema  ? ? ?Lab Results: ? ?Lab Results  ?Component Value Date  ? WBC 8.4 02/26/2022  ? HGB 13.3 02/26/2022  ? HCT 40.4 02/26/2022  ? MCV 97.1 02/26/2022  ? PLT 168 02/26/2022  ? NEUTROABS 5.3 02/26/2022  ? ? ?CMP  ?Lab Results  ?Component Value Date  ? NA 138 02/17/2022  ? K 4.0 02/17/2022  ? CL 104 02/17/2022  ? CO2 26 02/17/2022  ? GLUCOSE 89 02/17/2022  ? BUN 22 02/17/2022  ? CREATININE 1.45 (H) 02/17/2022  ? CALCIUM 9.0 02/17/2022  ? PROT 6.1 (L) 02/17/2022  ? ALBUMIN 3.6 02/17/2022  ? AST 15 02/17/2022  ? ALT 17 02/17/2022  ? ALKPHOS 83 02/17/2022  ? BILITOT 0.5 02/17/2022  ? GFRNONAA 50 (L) 02/17/2022  ? GFRAA >60 04/24/2019  ? ? ? ?Medications: I have reviewed the patient's current medications. ? ? ?Assessment/Plan: ?Amyloid involving an eyelid biopsy 06/10/2011 ?"Bruising" at the eyelids and mouth: Likely related to amyloidosis, persistent. ?Numbness and loss of vibratory sense at the fingertips: This predated Velcade-based therapy, but worsened. Now much improved following cervical spine surgery. ?Elevated serum free lambda light chains. The lambda light chains were lower on November 16 and slightly higher on 12/14/2011. ?Lambda light chain proteinuria. ?Bone marrow plasmacytosis: Variable increase in plasma cells  noted on the bone marrow biopsy 07/29/2011 with plasma cells estimated to represent between 4% and 20% of the cellular population. ?Remote history of prostate cancer. ?Sleep apnea. ?Dyslipidemia. ?Report of pneumonia on 2 occasions in 2011. ?Admission to a hospital in [place], [place] October 2012 with "pneumonia." A chest x-ray at [facility] on November 2 was negative .   ?Low serum immunoglobulin G level. ?Plasma cell dyscrasia with associated amyloidosis.   ?Initiation of systemic therapy with Cytoxan, Velcade, and Decadron 08/12/2011. Cycle #2 was initiated on 09/16/2011. Cycle #3 was initiated on 10/15/2011. Velcade was placed on hold due to neuropathy. ?The serum free lambda light chains were decreased on October 08 2011. ?The serum free lambda light chains were slightly increased on 12/14/2011, lower on 12/29/2011 and 02/02/2012. ?Initiation of Revlimid/Decadron February 2013, cycle 2 started on 01/28/2012. ?High-dose melphalan chemotherapy followed by autologous stem cell infusion on 02/09/2013 ?Initiation of Velcade/Decadrontherapy 05/28/2013, he completed 2 cycles on a day 1, 4, 8, 11 schedule ?Improvement in the serum free lambda light chains following induction Velcade/Decadron   ?Initiation of weekly Velcade/Decadron on 07/16/2013, last treatment on 10/16/2013 ?Improvement in the serum free lambda light chains on 08/21/2013 and 09/18/2013 [facility]) ?Normal serum free lambda light chains on 12/13/2013 ?Mild increase of the serum free lambda light chains beginning January 2016-stable ? treatment on the Pronto study at [facility] beginning 12/17/2015, in  of study 11/23/2016 ?Started on phase 2b Prothena study-long-term safety/efficacy study 01/11/2017, discontinued May 2018  ?Doxycycline started 05/17/2017, changed to once  daily August 2022 ?01/25/2022 bone marrow biopsy-hypercellular marrow with plasma cell neoplasm, 7%, plasma cells are predominantly small in size with occasional binucleation noted, no  lymphocytosis, lambda predominant; 13q-/-13 detected, 1p32/1q21 detected, CCND1(BCL1)/IgH t(11;14) detected ?Daratumumab initiated 01/28/2022 ? ?15. Loss of "balance "and proximal motor weakness secondary to cervical stenosis-status post decompression surgery on 02/19/2012. Improved, but not resolved stenosis with mass effect on the spinal cord noted on a repeat MRI 03/28/2012. He underwent further decompression surgery on 04/06/2012. The ataxia, weakness, and peripheral numbness is much improved.   ?16. Right lower Extremity deep vein thrombosis 02/02/2012-a Doppler ultrasound confirmed a gastrocnemius and peroneal deep vein thrombosis. An IVC filter was placed prior to surgery and he was maintained on Lovenox. The IVC filter was removed on 05/04/2012.   ?17. Anorexia following cervical spine surgery. Improved.   ?18. Episode of flank pain and hematuria in 03/31/2012-etiology unclear. He completed a course of antibiotics. The hematuria and pain have resolved.   ?19. Rectal wall thickening noted on a CT of the pelvis 03/31/2012.   ?20. colonoscopy showed an area of abnormality at the rectum with biopsy positive for amyloid.   ?21. Lung nodule noted on the CT 03/31/2012 , no lung nodule on a chest CT 07/30/2013   ?22. Lumbar stenosis-status post surgery on 07/31/2012.   ?23. Status post stem cell collection.   ?24.Rright internal jugular thrombosis, 10/22/2012, likely related to pheresis catheter and HIT   ?25. Admission with acute right pulmonary embolism 10/27/2012   ?26. Superior mesenteric vein/portal vein thrombosis diagnosed on a CT of the abdomen 10/28/2012 and 10/29/2012-treated with Argatroban , maintained on Coumadin anticoagulation until he hospital admission March 2014 for stem cell therapy   ?27. HIT confirmed on a serotonin release assay 10/29/2012   ?28. History of Renal insufficiency during the December 2013 hospital admission-likely related to dehydration and? Contrast nephropathy , persistent mild  elevation of the creatinine   ?29. Dysphagia-? Secondary to amyloidosis, reported when he was here on 05/08/2013. He did not complain of dysphagia today   ?30. Anemia secondary to multiple myeloma and chemotherapy , improved   ?31.right lower Extremity deep vein thrombosis diagnosed at [facility] July 2014 , repeat Doppler at [facility] 07/19/2013 with chronic thrombus in the right gastrocnemius, Eliquis has been discontinued   ?32. Fall August 2014 with a left shoulder injury, diagnosed with a rotator cuff tear, status post surgical repair 09/19/2013   ?33. 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 The neuropathy symptoms have partially improved since Velcade was discontinued after treatment 10/16/2013   ?34. Cardiac MRI 05/22/2015-normal left ventricular size with severe concentric hypertrophy and mildly impaired systolic function, DJSH-70%, right ventricular hypertrophy, diffuse late gadolinium enhancement in the left and right ventricles-findings consistent with cardiac amyloidosis. Echocardiogram at [facility]-LVEF 50% ?Echocardiogram at [facility] 04/20/2018 - left ventricular hypertrophy, LVEF 55-60% ?Echocardiogram at [facility] 08/28/2019-left ventricular ejection fraction 55-60%, left ventricle normal in size with upper normal wall thickness ?35. Multinodular goiter confirmed on thyroid ultrasound at [facility] 06/29/2016, amyloidosis of the thyroid confirmed at [facility] 04/20/2018 ?  ? ? ? ?Disposition: ? Mr Mollett appears stable.  He is tolerating the daratumumab well.  He will complete a total of 8 weekly treatments prior to changing to a 2-week schedule.  He will return for an office visit and daratumumab on 04/02/2022.  We will check the serum light chains when he is here on 04/02/2022. ?Betsy Coder, MD ? ?02/26/2022  ?10:29 AM ? ? ?

## 2022-02-26 NOTE — Progress Notes (Signed)
Patient seen by Dr. Sherrill today ? ?Vitals are within treatment parameters. ? ?Labs reviewed by Dr. Sherrill and are within treatment parameters. ? ?Per physician team, patient is ready for treatment and there are NO modifications to the treatment plan.  ?

## 2022-02-26 NOTE — Patient Instructions (Signed)
Brent Allison   ?Discharge Instructions: ?Thank you for choosing [facility] to provide your oncology and hematology care.  ? ?If you have a lab appointment with the [facility], please go directly to the [facility] and check in at the registration area. ?  ?Wear comfortable clothing and clothing appropriate for easy access to any Portacath or PICC line.  ? ?We strive to give you quality time with your provider. You may need to reschedule your appointment if you arrive late (15 or more minutes).  Arriving late affects you and other patients whose appointments are after yours.  Also, if you miss three or more appointments without notifying the office, you may be dismissed from the clinic at the provider?s discretion.    ?  ?For prescription refill requests, have your pharmacy contact our office and allow 72 hours for refills to be completed.   ? ?Today you received the following chemotherapy and/or immunotherapy agents Daratumumab and Hyaluronidase-fihj    ?  ?To help prevent nausea and vomiting after your treatment, we encourage you to take your nausea medication as directed. ? ?BELOW ARE SYMPTOMS THAT SHOULD BE REPORTED IMMEDIATELY: ?*FEVER GREATER THAN 100.4 F (38 ?C) OR HIGHER ?*CHILLS OR SWEATING ?*NAUSEA AND VOMITING THAT IS NOT CONTROLLED WITH YOUR NAUSEA MEDICATION ?*UNUSUAL SHORTNESS OF BREATH ?*UNUSUAL BRUISING OR BLEEDING ?*URINARY PROBLEMS (pain or burning when urinating, or frequent urination) ?*BOWEL PROBLEMS (unusual diarrhea, constipation, pain near the anus) ?TENDERNESS IN MOUTH AND THROAT WITH OR WITHOUT PRESENCE OF ULCERS (sore throat, sores in mouth, or a toothache) ?UNUSUAL RASH, SWELLING OR PAIN  ?UNUSUAL VAGINAL DISCHARGE OR ITCHING  ? ?Items with * indicate a potential emergency and should be followed up as soon as possible or go to the Emergency Department if any problems should occur. ? ?Please show the CHEMOTHERAPY ALERT CARD or IMMUNOTHERAPY  ALERT CARD at check-in to the Emergency Department and triage nurse. ? ?Should you have questions after your visit or need to cancel or reschedule your appointment, please contact [facility]  Dept: [phone]  and follow the prompts.  Office hours are 8:00 a.m. to 4:30 p.m. Monday - Friday. Please note that voicemails left after 4:00 p.m. may not be returned until the following business day.  We are closed weekends and major holidays. You have access to a nurse at all times for urgent questions. Please call the main number to the clinic Dept: [phone] and follow the prompts. ? ? ?For any non-urgent questions, you may also contact your provider using MyChart. We now offer e-Visits for anyone 7 and older to request care online for non-urgent symptoms. For details visit [email or web address]. ?  ?Also download the MyChart app! Go to the app store, search "MyChart", open the app, select [facility], and log in with your MyChart username and password. ? ?Due to Covid, a mask is required upon entering the hospital/clinic. If you do not have a mask, one will be given to you upon arrival. For doctor visits, patients may have 1 support person aged 40 or older with them. For treatment visits, patients cannot have anyone with them due to current Covid guidelines and our immunocompromised population.  ? ?Daratumumab; Hyaluronidase Injection ?What is this medication? ?DARATUMUMAB; HYALURONIDASE (dar a toom ue mab / hye al ur ON i dase) is a monoclonal antibody. Hyaluronidase is used to improve the effects of daratumumab. It treats certain types of cancer. Some of the cancers treated are  multiple myeloma and light-chain amyloidosis. ?This medicine may be used for other purposes; ask your health care provider or pharmacist if you have questions. ?COMMON BRAND NAME(S): DARZALEX FASPRO ?What should I tell my care team before I take this medication? ?They need to know if you have any of these  conditions: ?heart disease ?infection especially a viral infection such as chickenpox, cold sores, herpes, or hepatitis B ?lung or breathing disease ?an unusual or allergic reaction to daratumumab, hyaluronidase, other medicines, foods, dyes, or preservatives ?pregnant or trying to get pregnant ?breast-feeding ?How should I use this medication? ?This medicine is for injection under the skin. It is given by a health care professional in a hospital or clinic setting. ?Talk to your pediatrician regarding the use of this medicine in children. Special care may be needed. ?Overdosage: If you think you have taken too much of this medicine contact a poison control center or emergency room at once. ?NOTE: This medicine is only for you. Do not share this medicine with others. ?What if I miss a dose? ?Keep appointments for follow-up doses as directed. It is important not to miss your dose. Call your doctor or health care professional if you are unable to keep an appointment. ?What may interact with this medication? ?Interactions have not been studied. ?This list may not describe all possible interactions. Give your health care provider a list of all the medicines, herbs, non-prescription drugs, or dietary supplements you use. Also tell them if you smoke, drink alcohol, or use illegal drugs. Some items may interact with your medicine. ?What should I watch for while using this medication? ?Your condition will be monitored carefully while you are receiving this medicine. ?This medicine can cause serious allergic reactions. To reduce your risk, your health care provider may give you other medicine to take before receiving this one. Be sure to follow the directions from your health care provider. ?This medicine can affect the results of blood tests to match your blood type. These changes can last for up to 6 months after the final dose. Your healthcare provider will do blood tests to match your blood type before you start  treatment. Tell all of your healthcare providers that you are being treated with this medicine before receiving a blood transfusion. ?This medicine can affect the results of some tests used to determine treatment response; extra tests may be needed to evaluate response. ?Do not become pregnant while taking this medicine or for 3 months after stopping it. Women should inform their health care provider if they wish to become pregnant or think they might be pregnant. There is a potential for serious side effects to an unborn child. Talk to your health care provider for more information. Do not breast-feed an infant while taking this medicine. ?What side effects may I notice from receiving this medication? ?Side effects that you should report to your care team as soon as possible: ?Allergic reactions--skin rash, itching or hives, swelling of the face, lips, or tongue ?Blood clot--chest pain, shortness of breath, pain, swelling or warmth in the leg ?Blurred vision ?Fast, irregular heartbeat ?Infection--fever, chills, cough, sore throat, pain or trouble passing urine ?Injection reactions--dizziness, fast heartbeat, feeling faint or lightheaded, falls, headache, increase in blood pressure, nausea, vomiting, or wheezing or trouble breathing with loud or whistling sounds ?Low red blood cell counts--trouble breathing, feeling faint, lightheaded or falls, unusually weak or tired ?Unusual bleeding or bruising ?Side effects that usually do not require medical attention (report these to your care team  if they continue or are bothersome): ?Back pain ?Constipation ?Diarrhea ?Pain, tingling, numbness in the hands or feet ?Pain, redness, or irritation at site where injected ?Muscle cramp or pain ?Swelling of the ankles, feet, hands ?Tiredness ?Trouble sleeping ?This list may not describe all possible side effects. Call your doctor for medical advice about side effects. You may report side effects to FDA at 1-800-FDA-1088. ?Where  should I keep my medication? ?This drug is given in a hospital or clinic and will not be stored at home. ?NOTE: This sheet is a summary. It may not cover all possible information. If you have questions about this medicin

## 2022-02-26 NOTE — Progress Notes (Signed)
Patient presents for treatment. RN assessment completed along with the following: ? ?Labs/vitals reviewed - Yes, and within treatment parameters.   ?Weight within 10% of previous measurement - Yes ?Informed consent completed and reflects current therapy/intent - Yes, on date 01/28/22             ?Provider progress note reviewed - Yes, today's provider note was reviewed. ?Treatment/Antibody/Supportive plan reviewed - Yes, and there are no adjustments needed for today's treatment. ?S&H and other orders reviewed - Yes, and there are no additional orders identified. ?Previous treatment date reviewed - Yes, and the appropriate amount of time has elapsed between treatments. ?Clinic Hand Off Received from - Cristy Friedlander, RN ? ?Patient to proceed with treatment.  ? ?

## 2022-02-27 ENCOUNTER — Other Ambulatory Visit: Payer: Self-pay | Admitting: Oncology

## 2022-03-05 ENCOUNTER — Inpatient Hospital Stay: Payer: Medicare Other

## 2022-03-05 ENCOUNTER — Other Ambulatory Visit: Payer: Self-pay | Admitting: Oncology

## 2022-03-05 VITALS — BP 118/70 | HR 51 | Temp 97.8°F | Resp 18 | Wt 196.2 lb

## 2022-03-05 DIAGNOSIS — Z7901 Long term (current) use of anticoagulants: Secondary | ICD-10-CM | POA: Diagnosis not present

## 2022-03-05 DIAGNOSIS — E8581 Light chain (AL) amyloidosis: Secondary | ICD-10-CM

## 2022-03-05 DIAGNOSIS — E859 Amyloidosis, unspecified: Secondary | ICD-10-CM | POA: Diagnosis not present

## 2022-03-05 DIAGNOSIS — Z86711 Personal history of pulmonary embolism: Secondary | ICD-10-CM | POA: Diagnosis not present

## 2022-03-05 DIAGNOSIS — Z86718 Personal history of other venous thrombosis and embolism: Secondary | ICD-10-CM | POA: Diagnosis not present

## 2022-03-05 DIAGNOSIS — Z8546 Personal history of malignant neoplasm of prostate: Secondary | ICD-10-CM | POA: Diagnosis not present

## 2022-03-05 DIAGNOSIS — Z79899 Other long term (current) drug therapy: Secondary | ICD-10-CM | POA: Diagnosis not present

## 2022-03-05 LAB — CBC WITH DIFFERENTIAL (CANCER CENTER ONLY)
Abs Immature Granulocytes: 0.03 K/uL (ref 0.00–0.07)
Basophils Absolute: 0 K/uL (ref 0.0–0.1)
Basophils Relative: 0 %
Eosinophils Absolute: 0.1 K/uL (ref 0.0–0.5)
Eosinophils Relative: 1 %
HCT: 38.5 % — ABNORMAL LOW (ref 39.0–52.0)
Hemoglobin: 12.6 g/dL — ABNORMAL LOW (ref 13.0–17.0)
Immature Granulocytes: 0 %
Lymphocytes Relative: 24 %
Lymphs Abs: 2 K/uL (ref 0.7–4.0)
MCH: 31.7 pg (ref 26.0–34.0)
MCHC: 32.7 g/dL (ref 30.0–36.0)
MCV: 96.7 fL (ref 80.0–100.0)
Monocytes Absolute: 0.9 K/uL (ref 0.1–1.0)
Monocytes Relative: 10 %
Neutro Abs: 5.6 K/uL (ref 1.7–7.7)
Neutrophils Relative %: 65 %
Platelet Count: 162 K/uL (ref 150–400)
RBC: 3.98 MIL/uL — ABNORMAL LOW (ref 4.22–5.81)
RDW: 13.9 % (ref 11.5–15.5)
WBC Count: 8.6 K/uL (ref 4.0–10.5)
nRBC: 0 % (ref 0.0–0.2)

## 2022-03-05 MED ORDER — DARATUMUMAB-HYALURONIDASE-FIHJ 1800-30000 MG-UT/15ML ~~LOC~~ SOLN
1800.0000 mg | Freq: Once | SUBCUTANEOUS | Status: AC
  Administered 2022-03-05: 1800 mg via SUBCUTANEOUS
  Filled 2022-03-05: qty 15

## 2022-03-05 MED ORDER — DEXAMETHASONE 4 MG PO TABS
20.0000 mg | ORAL_TABLET | Freq: Once | ORAL | Status: AC
  Administered 2022-03-05: 20 mg via ORAL
  Filled 2022-03-05: qty 5

## 2022-03-05 MED ORDER — ACETAMINOPHEN 325 MG PO TABS
650.0000 mg | ORAL_TABLET | Freq: Once | ORAL | Status: AC
  Administered 2022-03-05: 650 mg via ORAL
  Filled 2022-03-05: qty 2

## 2022-03-05 MED ORDER — DIPHENHYDRAMINE HCL 25 MG PO CAPS
50.0000 mg | ORAL_CAPSULE | Freq: Once | ORAL | Status: AC
  Administered 2022-03-05: 50 mg via ORAL
  Filled 2022-03-05: qty 2

## 2022-03-05 NOTE — Patient Instructions (Signed)
[facility]  Discharge Instructions: ?Thank you for choosing [facility] to provide your oncology and hematology care.  ? ?If you have a lab appointment with the [facility], please go directly to the [facility] and check in at the registration area. ?  ?Wear comfortable clothing and clothing appropriate for easy access to any Portacath or PICC line.  ? ?We strive to give you quality time with your provider. You may need to reschedule your appointment if you arrive late (15 or more minutes).  Arriving late affects you and other patients whose appointments are after yours.  Also, if you miss three or more appointments without notifying the office, you may be dismissed from the clinic at the provider?s discretion.    ?  ?For prescription refill requests, have your pharmacy contact our office and allow 72 hours for refills to be completed.   ? ?Today you received the following chemotherapy and/or immunotherapy agents Darzalex Faspro    ?  ?To help prevent nausea and vomiting after your treatment, we encourage you to take your nausea medication as directed. ? ?BELOW ARE SYMPTOMS THAT SHOULD BE REPORTED IMMEDIATELY: ?*FEVER GREATER THAN 100.4 F (38 ?C) OR HIGHER ?*CHILLS OR SWEATING ?*NAUSEA AND VOMITING THAT IS NOT CONTROLLED WITH YOUR NAUSEA MEDICATION ?*UNUSUAL SHORTNESS OF BREATH ?*UNUSUAL BRUISING OR BLEEDING ?*URINARY PROBLEMS (pain or burning when urinating, or frequent urination) ?*BOWEL PROBLEMS (unusual diarrhea, constipation, pain near the anus) ?TENDERNESS IN MOUTH AND THROAT WITH OR WITHOUT PRESENCE OF ULCERS (sore throat, sores in mouth, or a toothache) ?UNUSUAL RASH, SWELLING OR PAIN  ?UNUSUAL VAGINAL DISCHARGE OR ITCHING  ? ?Items with * indicate a potential emergency and should be followed up as soon as possible or go to the Emergency Department if any problems should occur. ? ?Please show the CHEMOTHERAPY ALERT CARD or IMMUNOTHERAPY ALERT CARD at check-in  to the Emergency Department and triage nurse. ? ?Should you have questions after your visit or need to cancel or reschedule your appointment, please contact [facility]  Dept: [phone]  and follow the prompts.  Office hours are 8:00 a.m. to 4:30 p.m. Monday - Friday. Please note that voicemails left after 4:00 p.m. may not be returned until the following business day.  We are closed weekends and major holidays. You have access to a nurse at all times for urgent questions. Please call the main number to the clinic Dept: [phone] and follow the prompts. ? ? ?For any non-urgent questions, you may also contact your provider using MyChart. We now offer e-Visits for anyone 66 and older to request care online for non-urgent symptoms. For details visit [email or web address]. ?  ?Also download the MyChart app! Go to the app store, search "MyChart", open the app, select [facility], and log in with your MyChart username and password. ? ?Due to Covid, a mask is required upon entering the hospital/clinic. If you do not have a mask, one will be given to you upon arrival. For doctor visits, patients may have 1 support person aged 44 or older with them. For treatment visits, patients cannot have anyone with them due to current Covid guidelines and our immunocompromised population.  ? ?

## 2022-03-10 ENCOUNTER — Other Ambulatory Visit: Payer: Self-pay

## 2022-03-10 DIAGNOSIS — E8581 Light chain (AL) amyloidosis: Secondary | ICD-10-CM

## 2022-03-12 ENCOUNTER — Other Ambulatory Visit: Payer: Self-pay

## 2022-03-12 ENCOUNTER — Inpatient Hospital Stay (HOSPITAL_BASED_OUTPATIENT_CLINIC_OR_DEPARTMENT_OTHER): Payer: Medicare Other | Admitting: Oncology

## 2022-03-12 ENCOUNTER — Inpatient Hospital Stay: Payer: Medicare Other

## 2022-03-12 VITALS — BP 123/75 | HR 72 | Temp 98.5°F | Resp 18 | Ht 72.0 in | Wt 196.0 lb

## 2022-03-12 DIAGNOSIS — Z79899 Other long term (current) drug therapy: Secondary | ICD-10-CM | POA: Diagnosis not present

## 2022-03-12 DIAGNOSIS — Z86711 Personal history of pulmonary embolism: Secondary | ICD-10-CM | POA: Diagnosis not present

## 2022-03-12 DIAGNOSIS — Z86718 Personal history of other venous thrombosis and embolism: Secondary | ICD-10-CM | POA: Diagnosis not present

## 2022-03-12 DIAGNOSIS — E8581 Light chain (AL) amyloidosis: Secondary | ICD-10-CM

## 2022-03-12 DIAGNOSIS — Z8546 Personal history of malignant neoplasm of prostate: Secondary | ICD-10-CM | POA: Diagnosis not present

## 2022-03-12 DIAGNOSIS — E859 Amyloidosis, unspecified: Secondary | ICD-10-CM | POA: Diagnosis not present

## 2022-03-12 DIAGNOSIS — Z7901 Long term (current) use of anticoagulants: Secondary | ICD-10-CM | POA: Diagnosis not present

## 2022-03-12 LAB — CBC WITH DIFFERENTIAL (CANCER CENTER ONLY)
Abs Immature Granulocytes: 0.03 10*3/uL (ref 0.00–0.07)
Basophils Absolute: 0 10*3/uL (ref 0.0–0.1)
Basophils Relative: 0 %
Eosinophils Absolute: 0.1 10*3/uL (ref 0.0–0.5)
Eosinophils Relative: 1 %
HCT: 39.4 % (ref 39.0–52.0)
Hemoglobin: 13.2 g/dL (ref 13.0–17.0)
Immature Granulocytes: 0 %
Lymphocytes Relative: 20 %
Lymphs Abs: 2.2 10*3/uL (ref 0.7–4.0)
MCH: 32 pg (ref 26.0–34.0)
MCHC: 33.5 g/dL (ref 30.0–36.0)
MCV: 95.6 fL (ref 80.0–100.0)
Monocytes Absolute: 1.2 10*3/uL — ABNORMAL HIGH (ref 0.1–1.0)
Monocytes Relative: 11 %
Neutro Abs: 7.2 10*3/uL (ref 1.7–7.7)
Neutrophils Relative %: 68 %
Platelet Count: 153 10*3/uL (ref 150–400)
RBC: 4.12 MIL/uL — ABNORMAL LOW (ref 4.22–5.81)
RDW: 13.9 % (ref 11.5–15.5)
WBC Count: 10.7 10*3/uL — ABNORMAL HIGH (ref 4.0–10.5)
nRBC: 0 % (ref 0.0–0.2)

## 2022-03-12 MED ORDER — ACETAMINOPHEN 325 MG PO TABS
650.0000 mg | ORAL_TABLET | Freq: Once | ORAL | Status: AC
  Administered 2022-03-12: 650 mg via ORAL
  Filled 2022-03-12: qty 2

## 2022-03-12 MED ORDER — AMOXICILLIN-POT CLAVULANATE 500-125 MG PO TABS: 500.0000 mg | ORAL_TABLET | Freq: Two times a day (BID) | ORAL | 0 refills | Status: DC

## 2022-03-12 MED ORDER — DIPHENHYDRAMINE HCL 25 MG PO CAPS
50.0000 mg | ORAL_CAPSULE | Freq: Once | ORAL | Status: AC
  Administered 2022-03-12: 50 mg via ORAL
  Filled 2022-03-12: qty 2

## 2022-03-12 MED ORDER — DARATUMUMAB-HYALURONIDASE-FIHJ 1800-30000 MG-UT/15ML ~~LOC~~ SOLN
1800.0000 mg | Freq: Once | SUBCUTANEOUS | Status: AC
  Administered 2022-03-12: 1800 mg via SUBCUTANEOUS
  Filled 2022-03-12: qty 15

## 2022-03-12 MED ORDER — DEXAMETHASONE 4 MG PO TABS
20.0000 mg | ORAL_TABLET | Freq: Once | ORAL | Status: AC
  Administered 2022-03-12: 20 mg via ORAL
  Filled 2022-03-12: qty 5

## 2022-03-12 NOTE — Progress Notes (Signed)
Patient presents for treatment. RN assessment completed along with the following: ? ?Labs/vitals reviewed - Yes, and within treatment parameters.   ?Weight within 10% of previous measurement - Yes ?Informed consent completed and reflects current therapy/intent - Yes, on date 01/28/22             ?Provider progress note reviewed - Patient not seen by provider today. Most recent note dated 02/26/22 reviewed. ?Treatment/Antibody/Supportive plan reviewed - Yes, and there are no adjustments needed for today's treatment. ?S&H and other orders reviewed - Yes, and there are no additional orders identified. ?Previous treatment date reviewed - Yes, and the appropriate amount of time has elapsed between treatments. ?Clinic Hand Off Received from - none ? ?Patient to proceed with treatment.  ? ?

## 2022-03-12 NOTE — Progress Notes (Signed)
Notified Danae Orleans, LPN of patients complaints of pain, swelling and erythema of the right index finger for 3 days.  Dr [name] to come to bedside. Did recommend pt see PCP or dermatologist  ?

## 2022-03-12 NOTE — Patient Instructions (Signed)
Brent Allison   ?Discharge Instructions: ?Thank you for choosing [facility] to provide your oncology and hematology care.  ? ?If you have a lab appointment with the [facility], please go directly to the [facility] and check in at the registration area. ?  ?Wear comfortable clothing and clothing appropriate for easy access to any Portacath or PICC line.  ? ?We strive to give you quality time with your provider. You may need to reschedule your appointment if you arrive late (15 or more minutes).  Arriving late affects you and other patients whose appointments are after yours.  Also, if you miss three or more appointments without notifying the office, you may be dismissed from the clinic at the provider?s discretion.    ?  ?For prescription refill requests, have your pharmacy contact our office and allow 72 hours for refills to be completed.   ? ?Today you received the following chemotherapy and/or immunotherapy agents Darzalex Faspro    ?  ?To help prevent nausea and vomiting after your treatment, we encourage you to take your nausea medication as directed. ? ?BELOW ARE SYMPTOMS THAT SHOULD BE REPORTED IMMEDIATELY: ?*FEVER GREATER THAN 100.4 F (38 ?C) OR HIGHER ?*CHILLS OR SWEATING ?*NAUSEA AND VOMITING THAT IS NOT CONTROLLED WITH YOUR NAUSEA MEDICATION ?*UNUSUAL SHORTNESS OF BREATH ?*UNUSUAL BRUISING OR BLEEDING ?*URINARY PROBLEMS (pain or burning when urinating, or frequent urination) ?*BOWEL PROBLEMS (unusual diarrhea, constipation, pain near the anus) ?TENDERNESS IN MOUTH AND THROAT WITH OR WITHOUT PRESENCE OF ULCERS (sore throat, sores in mouth, or a toothache) ?UNUSUAL RASH, SWELLING OR PAIN  ?UNUSUAL VAGINAL DISCHARGE OR ITCHING  ? ?Items with * indicate a potential emergency and should be followed up as soon as possible or go to the Emergency Department if any problems should occur. ? ?Please show the CHEMOTHERAPY ALERT CARD or IMMUNOTHERAPY ALERT CARD at check-in  to the Emergency Department and triage nurse. ? ?Should you have questions after your visit or need to cancel or reschedule your appointment, please contact [facility]  Dept: [phone]  and follow the prompts.  Office hours are 8:00 a.m. to 4:30 p.m. Monday - Friday. Please note that voicemails left after 4:00 p.m. may not be returned until the following business day.  We are closed weekends and major holidays. You have access to a nurse at all times for urgent questions. Please call the main number to the clinic Dept: [phone] and follow the prompts. ? ? ?For any non-urgent questions, you may also contact your provider using MyChart. We now offer e-Visits for anyone 56 and older to request care online for non-urgent symptoms. For details visit [email or web address]. ?  ?Also download the MyChart app! Go to the app store, search "MyChart", open the app, select [facility], and log in with your MyChart username and password. ? ?Due to Covid, a mask is required upon entering the hospital/clinic. If you do not have a mask, one will be given to you upon arrival. For doctor visits, patients may have 1 support person aged 62 or older with them. For treatment visits, patients cannot have anyone with them due to current Covid guidelines and our immunocompromised population.  ? ? ?

## 2022-03-12 NOTE — Progress Notes (Signed)
?[facility] ?OFFICE PROGRESS NOTE ? ? ?Diagnosis: Amyloidosis ? ?INTERVAL HISTORY:  ? ?Brent Allison returns today for scheduled treatment with daratumumab.  He reports a cutaneous nodular lesion at the dorsal aspect of the right second finger beginning 2 to 3 days ago.  He reports no trauma to this area.  He has developed similar cutaneous nodular lesions over the hands in the past.  These resolved spontaneously. ?The area is now painful, swollen, and erythematous.  No fever. ? ?Objective: ? ?Vital signs in last 24 hours: ? ?There were no vitals taken for this visit. ?  ? ?Skin: 2-3 mm nodular area at the dorsum of the right mid second finger with a central eschar.  Surrounding induration and erythema extending to the base of the finger.  The area is tender to touch and he has discomfort with bending the PIP joint. ? ? ?Lab Results: ? ?Lab Results  ?Component Value Date  ? WBC 10.7 (H) 03/12/2022  ? HGB 13.2 03/12/2022  ? HCT 39.4 03/12/2022  ? MCV 95.6 03/12/2022  ? PLT 153 03/12/2022  ? NEUTROABS 7.2 03/12/2022  ? ? ?CMP  ?Lab Results  ?Component Value Date  ? NA 138 02/17/2022  ? K 4.0 02/17/2022  ? CL 104 02/17/2022  ? CO2 26 02/17/2022  ? GLUCOSE 89 02/17/2022  ? BUN 22 02/17/2022  ? CREATININE 1.45 (H) 02/17/2022  ? CALCIUM 9.0 02/17/2022  ? PROT 6.1 (L) 02/17/2022  ? ALBUMIN 3.6 02/17/2022  ? AST 15 02/17/2022  ? ALT 17 02/17/2022  ? ALKPHOS 83 02/17/2022  ? BILITOT 0.5 02/17/2022  ? GFRNONAA 50 (L) 02/17/2022  ? GFRAA >60 04/24/2019  ? ? ?Medications: I have reviewed the patient's current medications. ? ? ?Assessment/Plan: ?Amyloid involving an eyelid biopsy 06/10/2011 ?"Bruising" at the eyelids and mouth: Likely related to amyloidosis, persistent. ?Numbness and loss of vibratory sense at the fingertips: This predated Velcade-based therapy, but worsened. Now much improved following cervical spine surgery. ?Elevated serum free lambda light chains. The lambda light chains were lower on November 16  and slightly higher on 12/14/2011. ?Lambda light chain proteinuria. ?Bone marrow plasmacytosis: Variable increase in plasma cells noted on the bone marrow biopsy 07/29/2011 with plasma cells estimated to represent between 4% and 20% of the cellular population. ?Remote history of prostate cancer. ?Sleep apnea. ?Dyslipidemia. ?Report of pneumonia on 2 occasions in 2011. ?Admission to a hospital in [place], [place] October 2012 with "pneumonia." A chest x-ray at [facility] on November 2 was negative .   ?Low serum immunoglobulin G level. ?Plasma cell dyscrasia with associated amyloidosis.   ?Initiation of systemic therapy with Cytoxan, Velcade, and Decadron 08/12/2011. Cycle #2 was initiated on 09/16/2011. Cycle #3 was initiated on 10/15/2011. Velcade was placed on hold due to neuropathy. ?The serum free lambda light chains were decreased on October 08 2011. ?The serum free lambda light chains were slightly increased on 12/14/2011, lower on 12/29/2011 and 02/02/2012. ?Initiation of Revlimid/Decadron February 2013, cycle 2 started on 01/28/2012. ?High-dose melphalan chemotherapy followed by autologous stem cell infusion on 02/09/2013 ?Initiation of Velcade/Decadrontherapy 05/28/2013, he completed 2 cycles on a day 1, 4, 8, 11 schedule ?Improvement in the serum free lambda light chains following induction Velcade/Decadron   ?Initiation of weekly Velcade/Decadron on 07/16/2013, last treatment on 10/16/2013 ?Improvement in the serum free lambda light chains on 08/21/2013 and 09/18/2013 [facility]) ?Normal serum free lambda light chains on 12/13/2013 ?Mild increase of the serum free lambda light chains beginning January 2016-stable ?  treatment on the Pronto study at [facility] beginning 12/17/2015, in  of study 11/23/2016 ?Started on phase 2b Prothena study-long-term safety/efficacy study 01/11/2017, discontinued May 2018  ?Doxycycline started 05/17/2017, changed to once daily August 2022 ?01/25/2022 bone marrow  biopsy-hypercellular marrow with plasma cell neoplasm, 7%, plasma cells are predominantly small in size with occasional binucleation noted, no lymphocytosis, lambda predominant; 13q-/-13 detected, 1p32/1q21 detected, CCND1(BCL1)/IgH t(11;14) detected ?Daratumumab initiated 01/28/2022 ? ? ?15. Loss of "balance "and proximal motor weakness secondary to cervical stenosis-status post decompression surgery on 02/19/2012. Improved, but not resolved stenosis with mass effect on the spinal cord noted on a repeat MRI 03/28/2012. He underwent further decompression surgery on 04/06/2012. The ataxia, weakness, and peripheral numbness is much improved.   ?16. Right lower Extremity deep vein thrombosis 02/02/2012-a Doppler ultrasound confirmed a gastrocnemius and peroneal deep vein thrombosis. An IVC filter was placed prior to surgery and he was maintained on Lovenox. The IVC filter was removed on 05/04/2012.   ?17. Anorexia following cervical spine surgery. Improved.   ?18. Episode of flank pain and hematuria in 03/31/2012-etiology unclear. He completed a course of antibiotics. The hematuria and pain have resolved.   ?19. Rectal wall thickening noted on a CT of the pelvis 03/31/2012.   ?20. colonoscopy showed an area of abnormality at the rectum with biopsy positive for amyloid.   ?21. Lung nodule noted on the CT 03/31/2012 , no lung nodule on a chest CT 07/30/2013   ?22. Lumbar stenosis-status post surgery on 07/31/2012.   ?23. Status post stem cell collection.   ?24.Rright internal jugular thrombosis, 10/22/2012, likely related to pheresis catheter and HIT   ?25. Admission with acute right pulmonary embolism 10/27/2012   ?26. Superior mesenteric vein/portal vein thrombosis diagnosed on a CT of the abdomen 10/28/2012 and 10/29/2012-treated with Argatroban , maintained on Coumadin anticoagulation until he hospital admission March 2014 for stem cell therapy   ?27. HIT confirmed on a serotonin release assay 10/29/2012   ?28. History  of Renal insufficiency during the December 2013 hospital admission-likely related to dehydration and? Contrast nephropathy , persistent mild elevation of the creatinine   ?29. Dysphagia-? Secondary to amyloidosis, reported when he was here on 05/08/2013. He did not complain of dysphagia today   ?30. Anemia secondary to multiple myeloma and chemotherapy , improved   ?31.right lower Extremity deep vein thrombosis diagnosed at [facility] July 2014 , repeat Doppler at [facility] 07/19/2013 with chronic thrombus in the right gastrocnemius, Eliquis has been discontinued   ?32. Fall August 2014 with a left shoulder injury, diagnosed with a rotator cuff tear, status post surgical repair 09/19/2013   ?33. 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 The neuropathy symptoms have partially improved since Velcade was discontinued after treatment 10/16/2013   ?34. Cardiac MRI 05/22/2015-normal left ventricular size with severe concentric hypertrophy and mildly impaired systolic function, XHBZ-16%, right ventricular hypertrophy, diffuse late gadolinium enhancement in the left and right ventricles-findings consistent with cardiac amyloidosis. Echocardiogram at [facility]-LVEF 50% ?Echocardiogram at [facility] 04/20/2018 - left ventricular hypertrophy, LVEF 55-60% ?Echocardiogram at [facility] 08/28/2019-left ventricular ejection fraction 55-60%, left ventricle normal in size with upper normal wall thickness ?35. Multinodular goiter confirmed on thyroid ultrasound at [facility] 06/29/2016, amyloidosis of the thyroid confirmed at [facility] 04/20/2018 ?36.  Infection right second finger 03/12/2022 ?  ?Disposition: ?Brent Prestage is being treated with daratumumab for progressive amyloidosis.  He is tolerating the daratumumab well.  He presents today with an apparent cellulitis at the right second finger.  He will begin  a course of Augmentin.  He will go to the emergency room if the area of erythema progresses or if he develops a fever.  He will return as scheduled  next week. ? ?Betsy Coder, MD ? ?03/12/2022  ?1:23 PM ? ? ?

## 2022-03-13 ENCOUNTER — Other Ambulatory Visit: Payer: Self-pay | Admitting: Oncology

## 2022-03-19 ENCOUNTER — Inpatient Hospital Stay: Payer: Medicare Other

## 2022-03-19 VITALS — BP 135/72 | HR 71 | Temp 97.6°F | Resp 18 | Ht 72.0 in | Wt 198.4 lb

## 2022-03-19 DIAGNOSIS — Z79899 Other long term (current) drug therapy: Secondary | ICD-10-CM | POA: Diagnosis not present

## 2022-03-19 DIAGNOSIS — Z86718 Personal history of other venous thrombosis and embolism: Secondary | ICD-10-CM | POA: Diagnosis not present

## 2022-03-19 DIAGNOSIS — E8581 Light chain (AL) amyloidosis: Secondary | ICD-10-CM

## 2022-03-19 DIAGNOSIS — Z7901 Long term (current) use of anticoagulants: Secondary | ICD-10-CM | POA: Diagnosis not present

## 2022-03-19 DIAGNOSIS — Z8546 Personal history of malignant neoplasm of prostate: Secondary | ICD-10-CM | POA: Diagnosis not present

## 2022-03-19 DIAGNOSIS — E859 Amyloidosis, unspecified: Secondary | ICD-10-CM | POA: Diagnosis not present

## 2022-03-19 DIAGNOSIS — Z86711 Personal history of pulmonary embolism: Secondary | ICD-10-CM | POA: Diagnosis not present

## 2022-03-19 LAB — CBC WITH DIFFERENTIAL (CANCER CENTER ONLY)
Abs Immature Granulocytes: 0.03 10*3/uL (ref 0.00–0.07)
Basophils Absolute: 0 10*3/uL (ref 0.0–0.1)
Basophils Relative: 0 %
Eosinophils Absolute: 0.1 10*3/uL (ref 0.0–0.5)
Eosinophils Relative: 1 %
HCT: 39.6 % (ref 39.0–52.0)
Hemoglobin: 12.9 g/dL — ABNORMAL LOW (ref 13.0–17.0)
Immature Granulocytes: 0 %
Lymphocytes Relative: 19 %
Lymphs Abs: 1.7 10*3/uL (ref 0.7–4.0)
MCH: 31.8 pg (ref 26.0–34.0)
MCHC: 32.6 g/dL (ref 30.0–36.0)
MCV: 97.5 fL (ref 80.0–100.0)
Monocytes Absolute: 0.8 10*3/uL (ref 0.1–1.0)
Monocytes Relative: 9 %
Neutro Abs: 6.1 10*3/uL (ref 1.7–7.7)
Neutrophils Relative %: 71 %
Platelet Count: 153 10*3/uL (ref 150–400)
RBC: 4.06 MIL/uL — ABNORMAL LOW (ref 4.22–5.81)
RDW: 14.2 % (ref 11.5–15.5)
WBC Count: 8.8 10*3/uL (ref 4.0–10.5)
nRBC: 0 % (ref 0.0–0.2)

## 2022-03-19 MED ORDER — ACETAMINOPHEN 325 MG PO TABS
650.0000 mg | ORAL_TABLET | Freq: Once | ORAL | Status: AC
  Administered 2022-03-19: 650 mg via ORAL
  Filled 2022-03-19: qty 2

## 2022-03-19 MED ORDER — DARATUMUMAB-HYALURONIDASE-FIHJ 1800-30000 MG-UT/15ML ~~LOC~~ SOLN
1800.0000 mg | Freq: Once | SUBCUTANEOUS | Status: AC
  Administered 2022-03-19: 1800 mg via SUBCUTANEOUS
  Filled 2022-03-19: qty 15

## 2022-03-19 MED ORDER — DEXAMETHASONE 4 MG PO TABS
20.0000 mg | ORAL_TABLET | Freq: Once | ORAL | Status: AC
  Administered 2022-03-19: 20 mg via ORAL
  Filled 2022-03-19: qty 5

## 2022-03-19 MED ORDER — DIPHENHYDRAMINE HCL 25 MG PO CAPS
50.0000 mg | ORAL_CAPSULE | Freq: Once | ORAL | Status: AC
  Administered 2022-03-19: 50 mg via ORAL
  Filled 2022-03-19: qty 2

## 2022-03-19 NOTE — Patient Instructions (Signed)
[facility]   ?Discharge Instructions: ?Thank you for choosing [facility] to provide your oncology and hematology care.  ? ?If you have a lab appointment with the [facility], please go directly to the [facility] and check in at the registration area. ?  ?Wear comfortable clothing and clothing appropriate for easy access to any Portacath or PICC line.  ? ?We strive to give you quality time with your provider. You may need to reschedule your appointment if you arrive late (15 or more minutes).  Arriving late affects you and other patients whose appointments are after yours.  Also, if you miss three or more appointments without notifying the office, you may be dismissed from the clinic at the provider?s discretion.    ?  ?For prescription refill requests, have your pharmacy contact our office and allow 72 hours for refills to be completed.   ? ?Today you received the following chemotherapy and/or immunotherapy agents Darzalex Faspro    ?  ?To help prevent nausea and vomiting after your treatment, we encourage you to take your nausea medication as directed. ? ?BELOW ARE SYMPTOMS THAT SHOULD BE REPORTED IMMEDIATELY: ?*FEVER GREATER THAN 100.4 F (38 ?C) OR HIGHER ?*CHILLS OR SWEATING ?*NAUSEA AND VOMITING THAT IS NOT CONTROLLED WITH YOUR NAUSEA MEDICATION ?*UNUSUAL SHORTNESS OF BREATH ?*UNUSUAL BRUISING OR BLEEDING ?*URINARY PROBLEMS (pain or burning when urinating, or frequent urination) ?*BOWEL PROBLEMS (unusual diarrhea, constipation, pain near the anus) ?TENDERNESS IN MOUTH AND THROAT WITH OR WITHOUT PRESENCE OF ULCERS (sore throat, sores in mouth, or a toothache) ?UNUSUAL RASH, SWELLING OR PAIN  ?UNUSUAL VAGINAL DISCHARGE OR ITCHING  ? ?Items with * indicate a potential emergency and should be followed up as soon as possible or go to the Emergency Department if any problems should occur. ? ?Please show the CHEMOTHERAPY ALERT CARD or IMMUNOTHERAPY ALERT CARD at check-in  to the Emergency Department and triage nurse. ? ?Should you have questions after your visit or need to cancel or reschedule your appointment, please contact [facility]  Dept: [phone]  and follow the prompts.  Office hours are 8:00 a.m. to 4:30 p.m. Monday - Friday. Please note that voicemails left after 4:00 p.m. may not be returned until the following business day.  We are closed weekends and major holidays. You have access to a nurse at all times for urgent questions. Please call the main number to the clinic Dept: [phone] and follow the prompts. ? ? ?For any non-urgent questions, you may also contact your provider using MyChart. We now offer e-Visits for anyone 77 and older to request care online for non-urgent symptoms. For details visit [email or web address]. ?  ?Also download the MyChart app! Go to the app store, search "MyChart", open the app, select [facility], and log in with your MyChart username and password. ? ?Due to Covid, a mask is required upon entering the hospital/clinic. If you do not have a mask, one will be given to you upon arrival. For doctor visits, patients may have 1 support person aged 77 or older with them. For treatment visits, patients cannot have anyone with them due to current Covid guidelines and our immunocompromised population.  ? ? ?

## 2022-03-19 NOTE — Progress Notes (Signed)
Patient presents for treatment. RN assessment completed along with the following: ? ?Labs/vitals reviewed - Yes, and within treatment parameters.   ?Weight within 10% of previous measurement - Yes ?Informed consent completed and reflects current therapy/intent - Yes, on date 01/28/22             ?Provider progress note reviewed - Patient not seen by provider today. Most recent note dated 03/12/22 reviewed. ?Treatment/Antibody/Supportive plan reviewed - Yes, and there are no adjustments needed for today's treatment. ?S&H and other orders reviewed - Yes, and there are no additional orders identified. ?Previous treatment date reviewed - Yes, and the appropriate amount of time has elapsed between treatments. ? ? ?Patient to proceed with treatment.   ?

## 2022-03-25 DIAGNOSIS — Z20822 Contact with and (suspected) exposure to covid-19: Secondary | ICD-10-CM | POA: Diagnosis not present

## 2022-03-30 DIAGNOSIS — L570 Actinic keratosis: Secondary | ICD-10-CM | POA: Diagnosis not present

## 2022-03-30 DIAGNOSIS — D225 Melanocytic nevi of trunk: Secondary | ICD-10-CM | POA: Diagnosis not present

## 2022-03-30 DIAGNOSIS — L821 Other seborrheic keratosis: Secondary | ICD-10-CM | POA: Diagnosis not present

## 2022-03-30 DIAGNOSIS — L923 Foreign body granuloma of the skin and subcutaneous tissue: Secondary | ICD-10-CM | POA: Diagnosis not present

## 2022-03-30 DIAGNOSIS — Z85828 Personal history of other malignant neoplasm of skin: Secondary | ICD-10-CM | POA: Diagnosis not present

## 2022-04-02 ENCOUNTER — Inpatient Hospital Stay (HOSPITAL_BASED_OUTPATIENT_CLINIC_OR_DEPARTMENT_OTHER): Payer: Medicare Other | Admitting: Oncology

## 2022-04-02 ENCOUNTER — Inpatient Hospital Stay: Payer: Medicare Other

## 2022-04-02 ENCOUNTER — Inpatient Hospital Stay: Payer: Medicare Other | Attending: Oncology

## 2022-04-02 ENCOUNTER — Encounter: Payer: Self-pay | Admitting: *Deleted

## 2022-04-02 VITALS — BP 131/70 | HR 85 | Temp 98.2°F | Resp 18 | Ht 72.0 in | Wt 194.6 lb

## 2022-04-02 DIAGNOSIS — G4730 Sleep apnea, unspecified: Secondary | ICD-10-CM | POA: Insufficient documentation

## 2022-04-02 DIAGNOSIS — Z86711 Personal history of pulmonary embolism: Secondary | ICD-10-CM | POA: Insufficient documentation

## 2022-04-02 DIAGNOSIS — E785 Hyperlipidemia, unspecified: Secondary | ICD-10-CM | POA: Insufficient documentation

## 2022-04-02 DIAGNOSIS — Z7901 Long term (current) use of anticoagulants: Secondary | ICD-10-CM | POA: Diagnosis not present

## 2022-04-02 DIAGNOSIS — R5381 Other malaise: Secondary | ICD-10-CM | POA: Diagnosis not present

## 2022-04-02 DIAGNOSIS — Z79899 Other long term (current) drug therapy: Secondary | ICD-10-CM | POA: Diagnosis not present

## 2022-04-02 DIAGNOSIS — E8581 Light chain (AL) amyloidosis: Secondary | ICD-10-CM | POA: Diagnosis not present

## 2022-04-02 DIAGNOSIS — E859 Amyloidosis, unspecified: Secondary | ICD-10-CM | POA: Diagnosis not present

## 2022-04-02 DIAGNOSIS — L03011 Cellulitis of right finger: Secondary | ICD-10-CM | POA: Insufficient documentation

## 2022-04-02 DIAGNOSIS — Z8546 Personal history of malignant neoplasm of prostate: Secondary | ICD-10-CM | POA: Insufficient documentation

## 2022-04-02 DIAGNOSIS — Z86718 Personal history of other venous thrombosis and embolism: Secondary | ICD-10-CM | POA: Diagnosis not present

## 2022-04-02 DIAGNOSIS — K55069 Acute infarction of intestine, part and extent unspecified: Secondary | ICD-10-CM | POA: Diagnosis not present

## 2022-04-02 LAB — CBC WITH DIFFERENTIAL (CANCER CENTER ONLY)
Abs Immature Granulocytes: 0.01 10*3/uL (ref 0.00–0.07)
Basophils Absolute: 0 10*3/uL (ref 0.0–0.1)
Basophils Relative: 0 %
Eosinophils Absolute: 0.1 10*3/uL (ref 0.0–0.5)
Eosinophils Relative: 1 %
HCT: 39 % (ref 39.0–52.0)
Hemoglobin: 12.8 g/dL — ABNORMAL LOW (ref 13.0–17.0)
Immature Granulocytes: 0 %
Lymphocytes Relative: 21 %
Lymphs Abs: 1.7 10*3/uL (ref 0.7–4.0)
MCH: 32.3 pg (ref 26.0–34.0)
MCHC: 32.8 g/dL (ref 30.0–36.0)
MCV: 98.5 fL (ref 80.0–100.0)
Monocytes Absolute: 0.7 10*3/uL (ref 0.1–1.0)
Monocytes Relative: 9 %
Neutro Abs: 5.6 10*3/uL (ref 1.7–7.7)
Neutrophils Relative %: 69 %
Platelet Count: 147 10*3/uL — ABNORMAL LOW (ref 150–400)
RBC: 3.96 MIL/uL — ABNORMAL LOW (ref 4.22–5.81)
RDW: 14.3 % (ref 11.5–15.5)
WBC Count: 8.1 10*3/uL (ref 4.0–10.5)
nRBC: 0 % (ref 0.0–0.2)

## 2022-04-02 LAB — CMP (CANCER CENTER ONLY)
ALT: 12 U/L (ref 0–44)
AST: 15 U/L (ref 15–41)
Albumin: 3.8 g/dL (ref 3.5–5.0)
Alkaline Phosphatase: 73 U/L (ref 38–126)
Anion gap: 8 (ref 5–15)
BUN: 18 mg/dL (ref 8–23)
CO2: 26 mmol/L (ref 22–32)
Calcium: 9.1 mg/dL (ref 8.9–10.3)
Chloride: 107 mmol/L (ref 98–111)
Creatinine: 1.35 mg/dL — ABNORMAL HIGH (ref 0.61–1.24)
GFR, Estimated: 54 mL/min — ABNORMAL LOW (ref >60)
Glucose, Bld: 107 mg/dL — ABNORMAL HIGH (ref 70–99)
Potassium: 3.9 mmol/L (ref 3.5–5.1)
Sodium: 141 mmol/L (ref 135–145)
Total Bilirubin: 0.6 mg/dL (ref 0.3–1.2)
Total Protein: 5.6 g/dL — ABNORMAL LOW (ref 6.5–8.1)

## 2022-04-02 MED ORDER — DARATUMUMAB-HYALURONIDASE-FIHJ 1800-30000 MG-UT/15ML ~~LOC~~ SOLN
1800.0000 mg | Freq: Once | SUBCUTANEOUS | Status: AC
  Administered 2022-04-02: 1800 mg via SUBCUTANEOUS
  Filled 2022-04-02: qty 15

## 2022-04-02 MED ORDER — ACETAMINOPHEN 325 MG PO TABS
650.0000 mg | ORAL_TABLET | Freq: Once | ORAL | Status: AC
  Administered 2022-04-02: 650 mg via ORAL
  Filled 2022-04-02: qty 2

## 2022-04-02 MED ORDER — DEXAMETHASONE 4 MG PO TABS
20.0000 mg | ORAL_TABLET | Freq: Once | ORAL | Status: AC
  Administered 2022-04-02: 20 mg via ORAL
  Filled 2022-04-02: qty 5

## 2022-04-02 MED ORDER — DIPHENHYDRAMINE HCL 25 MG PO CAPS
50.0000 mg | ORAL_CAPSULE | Freq: Once | ORAL | Status: AC
  Administered 2022-04-02: 50 mg via ORAL
  Filled 2022-04-02: qty 2

## 2022-04-02 NOTE — Progress Notes (Signed)
Patient presents for treatment. RN assessment completed along with the following: ? ?Labs/vitals reviewed - Yes, and within treatment parameters.   ?Weight within 10% of previous measurement - Yes ?Informed consent completed and reflects current therapy/intent - Yes, on date 01/28/2022             ?Provider progress note reviewed - Today's provider note is not yet available. I reviewed the most recent oncology provider progress note in chart dated 03/12/22. ?Treatment/Antibody/Supportive plan reviewed - Yes, and there are no adjustments needed for today's treatment. ?S&H and other orders reviewed - Yes, and there are no additional orders identified. ?Previous treatment date reviewed - Yes, and the appropriate amount of time has elapsed between treatments. ?Clinic Hand Off Received from - Cristy Friedlander, RN ? ?Patient to proceed with treatment.  ? ?

## 2022-04-02 NOTE — Progress Notes (Signed)
Patient seen by Dr. Sherrill today ? ?Vitals are within treatment parameters. ? ?Labs reviewed by Dr. Sherrill and are within treatment parameters. ? ?Per physician team, patient is ready for treatment and there are NO modifications to the treatment plan.  ?

## 2022-04-02 NOTE — Patient Instructions (Signed)
[facility]  Discharge Instructions: ?Thank you for choosing [facility] to provide your oncology and hematology care.  ? ?If you have a lab appointment with the [facility], please go directly to the [facility] and check in at the registration area. ?  ?Wear comfortable clothing and clothing appropriate for easy access to any Portacath or PICC line.  ? ?We strive to give you quality time with your provider. You may need to reschedule your appointment if you arrive late (15 or more minutes).  Arriving late affects you and other patients whose appointments are after yours.  Also, if you miss three or more appointments without notifying the office, you may be dismissed from the clinic at the provider?s discretion.    ?  ?For prescription refill requests, have your pharmacy contact our office and allow 72 hours for refills to be completed.   ? ?Today you received the following chemotherapy and/or immunotherapy agents Darzalex Faspro    ?  ?To help prevent nausea and vomiting after your treatment, we encourage you to take your nausea medication as directed. ? ?BELOW ARE SYMPTOMS THAT SHOULD BE REPORTED IMMEDIATELY: ?*FEVER GREATER THAN 100.4 F (38 ?C) OR HIGHER ?*CHILLS OR SWEATING ?*NAUSEA AND VOMITING THAT IS NOT CONTROLLED WITH YOUR NAUSEA MEDICATION ?*UNUSUAL SHORTNESS OF BREATH ?*UNUSUAL BRUISING OR BLEEDING ?*URINARY PROBLEMS (pain or burning when urinating, or frequent urination) ?*BOWEL PROBLEMS (unusual diarrhea, constipation, pain near the anus) ?TENDERNESS IN MOUTH AND THROAT WITH OR WITHOUT PRESENCE OF ULCERS (sore throat, sores in mouth, or a toothache) ?UNUSUAL RASH, SWELLING OR PAIN  ?UNUSUAL VAGINAL DISCHARGE OR ITCHING  ? ?Items with * indicate a potential emergency and should be followed up as soon as possible or go to the Emergency Department if any problems should occur. ? ?Please show the CHEMOTHERAPY ALERT CARD or IMMUNOTHERAPY ALERT CARD at check-in  to the Emergency Department and triage nurse. ? ?Should you have questions after your visit or need to cancel or reschedule your appointment, please contact [facility]  Dept: [phone]  and follow the prompts.  Office hours are 8:00 a.m. to 4:30 p.m. Monday - Friday. Please note that voicemails left after 4:00 p.m. may not be returned until the following business day.  We are closed weekends and major holidays. You have access to a nurse at all times for urgent questions. Please call the main number to the clinic Dept: [phone] and follow the prompts. ? ? ?For any non-urgent questions, you may also contact your provider using MyChart. We now offer e-Visits for anyone 64 and older to request care online for non-urgent symptoms. For details visit [email or web address]. ?  ?Also download the MyChart app! Go to the app store, search "MyChart", open the app, select [facility], and log in with your MyChart username and password. ? ?Due to Covid, a mask is required upon entering the hospital/clinic. If you do not have a mask, one will be given to you upon arrival. For doctor visits, patients may have 1 support person aged 97 or older with them. For treatment visits, patients cannot have anyone with them due to current Covid guidelines and our immunocompromised population.  ? ?

## 2022-04-02 NOTE — Progress Notes (Signed)
?[facility] ?OFFICE PROGRESS NOTE ? ? ?Diagnosis: Amyloidosis ? ?INTERVAL HISTORY:  ? ? Mr Crombie has completed 8 weeks of daratumumab.  He reports increased malaise.  Back pain improved following the most recent cervical spine surgery.  I saw him on 03/12/2022 with an infection at the right second finger.  He reports improvement in erythema and induration beginning the day after starting Augmentin.  He saw Dr. Ronnald Ramp and is being referred to a hand surgeon as there may be a foreign body in the finger. ? ?Objective: ? ?Vital signs in last 24 hours: ? ?Blood pressure 131/70, pulse 85, temperature 98.2 ?F (36.8 ?C), temperature source Oral, resp. rate 18, height 6' (1.829 m), weight 194 lb 9.6 oz (88.3 kg), SpO2 99 %. ?  ? ?HEENT: No thrush ?Resp: Lungs clear bilaterally ?Cardio: Regular rate and rhythm ?GI: No hepatosplenomegaly ?Vascular: The right lower leg is larger than the left side  ?Skin: No erythema at the right second finger, 3-4 mm round slightly raised area with a central "head ". ? ?Lab Results: ? ?Lab Results  ?Component Value Date  ? WBC 8.1 04/02/2022  ? HGB 12.8 (L) 04/02/2022  ? HCT 39.0 04/02/2022  ? MCV 98.5 04/02/2022  ? PLT 147 (L) 04/02/2022  ? NEUTROABS 5.6 04/02/2022  ? ? ?CMP  ?Lab Results  ?Component Value Date  ? NA 138 02/17/2022  ? K 4.0 02/17/2022  ? CL 104 02/17/2022  ? CO2 26 02/17/2022  ? GLUCOSE 89 02/17/2022  ? BUN 22 02/17/2022  ? CREATININE 1.45 (H) 02/17/2022  ? CALCIUM 9.0 02/17/2022  ? PROT 6.1 (L) 02/17/2022  ? ALBUMIN 3.6 02/17/2022  ? AST 15 02/17/2022  ? ALT 17 02/17/2022  ? ALKPHOS 83 02/17/2022  ? BILITOT 0.5 02/17/2022  ? GFRNONAA 50 (L) 02/17/2022  ? GFRAA >60 04/24/2019  ? ? ?No results found for: CEA1, CEA, [id number], CA125 ? ?Lab Results  ?Component Value Date  ? INR 1.20 (L) 03/13/2013  ? LABPROT 42.6 (H) 11/20/2012  ? ? ?Imaging: ? ?No results found. ? ?Medications: I have reviewed the patient's current medications. ? ? ?Assessment/Plan: ? ?Amyloid  involving an eyelid biopsy 06/10/2011 ?"Bruising" at the eyelids and mouth: Likely related to amyloidosis, persistent. ?Numbness and loss of vibratory sense at the fingertips: This predated Velcade-based therapy, but worsened. Now much improved following cervical spine surgery. ?Elevated serum free lambda light chains. The lambda light chains were lower on November 16 and slightly higher on 12/14/2011. ?Lambda light chain proteinuria. ?Bone marrow plasmacytosis: Variable increase in plasma cells noted on the bone marrow biopsy 07/29/2011 with plasma cells estimated to represent between 4% and 20% of the cellular population. ?Remote history of prostate cancer. ?Sleep apnea. ?Dyslipidemia. ?Report of pneumonia on 2 occasions in 2011. ?Admission to a hospital in [place], [place] October 2012 with "pneumonia." A chest x-ray at [facility] on November 2 was negative .   ?Low serum immunoglobulin G level. ?Plasma cell dyscrasia with associated amyloidosis.   ?Initiation of systemic therapy with Cytoxan, Velcade, and Decadron 08/12/2011. Cycle #2 was initiated on 09/16/2011. Cycle #3 was initiated on 10/15/2011. Velcade was placed on hold due to neuropathy. ?The serum free lambda light chains were decreased on October 08 2011. ?The serum free lambda light chains were slightly increased on 12/14/2011, lower on 12/29/2011 and 02/02/2012. ?Initiation of Revlimid/Decadron February 2013, cycle 2 started on 01/28/2012. ?High-dose melphalan chemotherapy followed by autologous stem cell infusion on 02/09/2013 ?Initiation of Velcade/Decadrontherapy 05/28/2013, he completed  2 cycles on a day 1, 4, 8, 11 schedule ?Improvement in the serum free lambda light chains following induction Velcade/Decadron   ?Initiation of weekly Velcade/Decadron on 07/16/2013, last treatment on 10/16/2013 ?Improvement in the serum free lambda light chains on 08/21/2013 and 09/18/2013 [facility]) ?Normal serum free lambda light chains on 12/13/2013 ?Mild  increase of the serum free lambda light chains beginning January 2016-stable ? treatment on the Pronto study at [facility] beginning 12/17/2015, in  of study 11/23/2016 ?Started on phase 2b Prothena study-long-term safety/efficacy study 01/11/2017, discontinued May 2018  ?Doxycycline started 05/17/2017, changed to once daily August 2022 ?01/25/2022 bone marrow biopsy-hypercellular marrow with plasma cell neoplasm, 7%, plasma cells are predominantly small in size with occasional binucleation noted, no lymphocytosis, lambda predominant; 13q-/-13 detected, 1p32/1q21 detected, CCND1(BCL1)/IgH t(11;14) detected ?Daratumumab/Decadron initiated 01/28/2022 ?13.  Soft tissue infection/cellulitis right second finger 03/12/2022-Augmentin ? ? ? ?Disposition: ? Mr Errico has completed 8 weeks of daratumumab therapy.  He has tolerated the treatment well.  He will begin every 2-week daratumumab today.  We will follow-up on the serum light chains from today.  He will return for an office visit in 4 weeks. ? ?He plans to follow-up with a hand surgeon to evaluate the right second finger. ? ?Betsy Coder, MD ? ?04/02/2022  ?10:05 AM ? ? ?

## 2022-04-05 LAB — KAPPA/LAMBDA LIGHT CHAINS
Kappa free light chain: 11.1 mg/L (ref 3.3–19.4)
Kappa, lambda light chain ratio: 0.16 — ABNORMAL LOW (ref 0.26–1.65)
Lambda free light chains: 69.4 mg/L — ABNORMAL HIGH (ref 5.7–26.3)

## 2022-04-07 ENCOUNTER — Telehealth: Payer: Self-pay

## 2022-04-07 NOTE — Telephone Encounter (Signed)
-----   Message from Ladell Pier, MD sent at 04/06/2022  8:28 AM EDT ----- ?Please call patient, light chains are better, plan to continue daratumumab/Decadron ? ?

## 2022-04-07 NOTE — Telephone Encounter (Signed)
Pt verbalized understanding.

## 2022-04-08 DIAGNOSIS — R2231 Localized swelling, mass and lump, right upper limb: Secondary | ICD-10-CM | POA: Diagnosis not present

## 2022-04-08 DIAGNOSIS — M79644 Pain in right finger(s): Secondary | ICD-10-CM | POA: Diagnosis not present

## 2022-04-09 ENCOUNTER — Other Ambulatory Visit: Payer: Self-pay | Admitting: Oncology

## 2022-04-11 ENCOUNTER — Encounter: Payer: Self-pay | Admitting: Oncology

## 2022-04-13 DIAGNOSIS — M4712 Other spondylosis with myelopathy, cervical region: Secondary | ICD-10-CM | POA: Diagnosis not present

## 2022-04-13 DIAGNOSIS — M542 Cervicalgia: Secondary | ICD-10-CM | POA: Diagnosis not present

## 2022-04-13 DIAGNOSIS — Z6825 Body mass index (BMI) 25.0-25.9, adult: Secondary | ICD-10-CM | POA: Diagnosis not present

## 2022-04-16 ENCOUNTER — Other Ambulatory Visit (HOSPITAL_COMMUNITY): Payer: Self-pay

## 2022-04-16 ENCOUNTER — Inpatient Hospital Stay (HOSPITAL_BASED_OUTPATIENT_CLINIC_OR_DEPARTMENT_OTHER): Payer: Medicare Other | Admitting: Nurse Practitioner

## 2022-04-16 ENCOUNTER — Encounter: Payer: Self-pay | Admitting: Oncology

## 2022-04-16 ENCOUNTER — Ambulatory Visit (HOSPITAL_BASED_OUTPATIENT_CLINIC_OR_DEPARTMENT_OTHER)
Admission: RE | Admit: 2022-04-16 | Discharge: 2022-04-16 | Disposition: A | Discharge status: Home / Self Care | Payer: Medicare Other | Source: Ambulatory Visit | Attending: Nurse Practitioner | Admitting: Nurse Practitioner

## 2022-04-16 ENCOUNTER — Encounter: Payer: Self-pay | Admitting: Nurse Practitioner

## 2022-04-16 ENCOUNTER — Telehealth: Payer: Self-pay

## 2022-04-16 ENCOUNTER — Other Ambulatory Visit: Payer: Self-pay | Admitting: *Deleted

## 2022-04-16 ENCOUNTER — Inpatient Hospital Stay: Payer: Medicare Other

## 2022-04-16 ENCOUNTER — Other Ambulatory Visit: Payer: Self-pay

## 2022-04-16 ENCOUNTER — Other Ambulatory Visit: Payer: Self-pay | Admitting: Nurse Practitioner

## 2022-04-16 VITALS — BP 125/87 | HR 78 | Temp 98.8°F | Resp 20 | Ht 72.0 in | Wt 196.3 lb

## 2022-04-16 VITALS — BP 116/61 | HR 76 | Temp 98.2°F | Resp 18 | Wt 196.2 lb

## 2022-04-16 DIAGNOSIS — K55069 Acute infarction of intestine, part and extent unspecified: Secondary | ICD-10-CM

## 2022-04-16 DIAGNOSIS — E8581 Light chain (AL) amyloidosis: Secondary | ICD-10-CM

## 2022-04-16 DIAGNOSIS — Z86711 Personal history of pulmonary embolism: Secondary | ICD-10-CM | POA: Diagnosis not present

## 2022-04-16 DIAGNOSIS — E859 Amyloidosis, unspecified: Secondary | ICD-10-CM | POA: Diagnosis not present

## 2022-04-16 DIAGNOSIS — Z8546 Personal history of malignant neoplasm of prostate: Secondary | ICD-10-CM | POA: Diagnosis not present

## 2022-04-16 DIAGNOSIS — Z79899 Other long term (current) drug therapy: Secondary | ICD-10-CM | POA: Diagnosis not present

## 2022-04-16 DIAGNOSIS — Z7901 Long term (current) use of anticoagulants: Secondary | ICD-10-CM | POA: Diagnosis not present

## 2022-04-16 DIAGNOSIS — I82451 Acute embolism and thrombosis of right peroneal vein: Secondary | ICD-10-CM | POA: Diagnosis not present

## 2022-04-16 DIAGNOSIS — I824Z1 Acute embolism and thrombosis of unspecified deep veins of right distal lower extremity: Secondary | ICD-10-CM

## 2022-04-16 DIAGNOSIS — Z86718 Personal history of other venous thrombosis and embolism: Secondary | ICD-10-CM | POA: Diagnosis not present

## 2022-04-16 LAB — CBC WITH DIFFERENTIAL (CANCER CENTER ONLY)
Abs Immature Granulocytes: 0.01 10*3/uL (ref 0.00–0.07)
Basophils Absolute: 0 10*3/uL (ref 0.0–0.1)
Basophils Relative: 0 %
Eosinophils Absolute: 0.1 10*3/uL (ref 0.0–0.5)
Eosinophils Relative: 1 %
HCT: 39.2 % (ref 39.0–52.0)
Hemoglobin: 12.6 g/dL — ABNORMAL LOW (ref 13.0–17.0)
Immature Granulocytes: 0 %
Lymphocytes Relative: 21 %
Lymphs Abs: 1.7 10*3/uL (ref 0.7–4.0)
MCH: 31.8 pg (ref 26.0–34.0)
MCHC: 32.1 g/dL (ref 30.0–36.0)
MCV: 99 fL (ref 80.0–100.0)
Monocytes Absolute: 0.7 10*3/uL (ref 0.1–1.0)
Monocytes Relative: 9 %
Neutro Abs: 5.5 10*3/uL (ref 1.7–7.7)
Neutrophils Relative %: 69 %
Platelet Count: 153 10*3/uL (ref 150–400)
RBC: 3.96 MIL/uL — ABNORMAL LOW (ref 4.22–5.81)
RDW: 14.4 % (ref 11.5–15.5)
WBC Count: 8 10*3/uL (ref 4.0–10.5)
nRBC: 0 % (ref 0.0–0.2)

## 2022-04-16 LAB — CMP (CANCER CENTER ONLY)
ALT: 12 U/L (ref 0–44)
AST: 16 U/L (ref 15–41)
Albumin: 3.5 g/dL (ref 3.5–5.0)
Alkaline Phosphatase: 67 U/L (ref 38–126)
Anion gap: 8 (ref 5–15)
BUN: 16 mg/dL (ref 8–23)
CO2: 26 mmol/L (ref 22–32)
Calcium: 9.1 mg/dL (ref 8.9–10.3)
Chloride: 106 mmol/L (ref 98–111)
Creatinine: 1.35 mg/dL — ABNORMAL HIGH (ref 0.61–1.24)
GFR, Estimated: 54 mL/min — ABNORMAL LOW (ref >60)
Glucose, Bld: 103 mg/dL — ABNORMAL HIGH (ref 70–99)
Potassium: 4.3 mmol/L (ref 3.5–5.1)
Sodium: 140 mmol/L (ref 135–145)
Total Bilirubin: 0.6 mg/dL (ref 0.3–1.2)
Total Protein: 5.4 g/dL — ABNORMAL LOW (ref 6.5–8.1)

## 2022-04-16 MED ORDER — DEXAMETHASONE 4 MG PO TABS
20.0000 mg | ORAL_TABLET | Freq: Once | ORAL | Status: AC
  Administered 2022-04-16: 20 mg via ORAL
  Filled 2022-04-16: qty 5

## 2022-04-16 MED ORDER — DIPHENHYDRAMINE HCL 25 MG PO CAPS
50.0000 mg | ORAL_CAPSULE | Freq: Once | ORAL | Status: AC
  Administered 2022-04-16: 50 mg via ORAL
  Filled 2022-04-16: qty 2

## 2022-04-16 MED ORDER — DARATUMUMAB-HYALURONIDASE-FIHJ 1800-30000 MG-UT/15ML ~~LOC~~ SOLN
1800.0000 mg | Freq: Once | SUBCUTANEOUS | Status: AC
  Administered 2022-04-16: 1800 mg via SUBCUTANEOUS
  Filled 2022-04-16: qty 15

## 2022-04-16 MED ORDER — APIXABAN 5 MG PO TABS
5.0000 mg | ORAL_TABLET | Freq: Two times a day (BID) | ORAL | 3 refills | Status: DC
  Filled 2022-04-16: qty 60, 30d supply, fill #0

## 2022-04-16 MED ORDER — APIXABAN 5 MG PO TABS: 5.0000 mg | ORAL_TABLET | Freq: Two times a day (BID) | ORAL | 3 refills | Status: DC

## 2022-04-16 MED ORDER — ACETAMINOPHEN 325 MG PO TABS
650.0000 mg | ORAL_TABLET | Freq: Once | ORAL | Status: AC
  Administered 2022-04-16: 650 mg via ORAL
  Filled 2022-04-16: qty 2

## 2022-04-16 NOTE — Telephone Encounter (Signed)
[place] to ask how much will it cost the patient for Eliquis. Pharmacy tech stated it will cost the patient $457.00 for a 30 day supplies. I let the tech know to cancel the prescription. The second scription was sent to [place] and the cost will be $0 for today. I called the patient and let him know the cost of the Eliquis for today will be $0. [facility] pharmacy had a one time discount for the patient. Next mouth the cost will be $457.00. Patient gave verbal understanding and had no further questions or concerns

## 2022-04-16 NOTE — Progress Notes (Signed)
[place] orders placed for BLE

## 2022-04-16 NOTE — Patient Instructions (Signed)
[place]   Discharge Instructions: Thank you for choosing [facility] to provide your oncology and hematology care.   If you have a lab appointment with the [facility], please go directly to the [facility] and check in at the registration area.   Wear comfortable clothing and clothing appropriate for easy access to any Portacath or PICC line.   We strive to give you quality time with your provider. You may need to reschedule your appointment if you arrive late (15 or more minutes).  Arriving late affects you and other patients whose appointments are after yours.  Also, if you miss three or more appointments without notifying the office, you may be dismissed from the clinic at the provider's discretion.      For prescription refill requests, have your pharmacy contact our office and allow 72 hours for refills to be completed.    Today you received the following chemotherapy and/or immunotherapy agents Daratumumab-hyaluronidase-fihj (DARZALEX FASPRO).      To help prevent nausea and vomiting after your treatment, we encourage you to take your nausea medication as directed.  BELOW ARE SYMPTOMS THAT SHOULD BE REPORTED IMMEDIATELY: *FEVER GREATER THAN 100.4 F (38 C) OR HIGHER *CHILLS OR SWEATING *NAUSEA AND VOMITING THAT IS NOT CONTROLLED WITH YOUR NAUSEA MEDICATION *UNUSUAL SHORTNESS OF BREATH *UNUSUAL BRUISING OR BLEEDING *URINARY PROBLEMS (pain or burning when urinating, or frequent urination) *BOWEL PROBLEMS (unusual diarrhea, constipation, pain near the anus) TENDERNESS IN MOUTH AND THROAT WITH OR WITHOUT PRESENCE OF ULCERS (sore throat, sores in mouth, or a toothache) UNUSUAL RASH, SWELLING OR PAIN  UNUSUAL VAGINAL DISCHARGE OR ITCHING   Items with * indicate a potential emergency and should be followed up as soon as possible or go to the Emergency Department if any problems should occur.  Please show the CHEMOTHERAPY ALERT CARD or  IMMUNOTHERAPY ALERT CARD at check-in to the Emergency Department and triage nurse.  Should you have questions after your visit or need to cancel or reschedule your appointment, please contact [facility]  Dept: [phone]  and follow the prompts.  Office hours are 8:00 a.m. to 4:30 p.m. Monday - Friday. Please note that voicemails left after 4:00 p.m. may not be returned until the following business day.  We are closed weekends and major holidays. You have access to a nurse at all times for urgent questions. Please call the main number to the clinic Dept: [phone] and follow the prompts.   For any non-urgent questions, you may also contact your provider using MyChart. We now offer e-Visits for anyone 20 and older to request care online for non-urgent symptoms. For details visit [email or web address].   Also download the MyChart app! Go to the app store, search "MyChart", open the app, select [facility], and log in with your MyChart username and password.  Due to Covid, a mask is required upon entering the hospital/clinic. If you do not have a mask, one will be given to you upon arrival. For doctor visits, patients may have 1 support person aged 64 or older with them. For treatment visits, patients cannot have anyone with them due to current Covid guidelines and our immunocompromised population.   Daratumumab; Hyaluronidase Injection What is this medication? DARATUMUMAB; HYALURONIDASE (dar a toom ue mab / hye al ur ON i dase) is a monoclonal antibody. Hyaluronidase is used to improve the effects of daratumumab. It treats certain types of cancer. Some of the cancers treated are  multiple myeloma and light-chain amyloidosis. This medicine may be used for other purposes; ask your health care provider or pharmacist if you have questions. COMMON BRAND NAME(S): DARZALEX FASPRO What should I tell my care team before I take this medication? They need to know if you have any  of these conditions: heart disease infection especially a viral infection such as chickenpox, cold sores, herpes, or hepatitis B lung or breathing disease an unusual or allergic reaction to daratumumab, hyaluronidase, other medicines, foods, dyes, or preservatives pregnant or trying to get pregnant breast-feeding How should I use this medication? This medicine is for injection under the skin. It is given by a health care professional in a hospital or clinic setting. Talk to your pediatrician regarding the use of this medicine in children. Special care may be needed. Overdosage: If you think you have taken too much of this medicine contact a poison control center or emergency room at once. NOTE: This medicine is only for you. Do not share this medicine with others. What if I miss a dose? Keep appointments for follow-up doses as directed. It is important not to miss your dose. Call your doctor or health care professional if you are unable to keep an appointment. What may interact with this medication? Interactions have not been studied. This list may not describe all possible interactions. Give your health care provider a list of all the medicines, herbs, non-prescription drugs, or dietary supplements you use. Also tell them if you smoke, drink alcohol, or use illegal drugs. Some items may interact with your medicine. What should I watch for while using this medication? Your condition will be monitored carefully while you are receiving this medicine. This medicine can cause serious allergic reactions. To reduce your risk, your health care provider may give you other medicine to take before receiving this one. Be sure to follow the directions from your health care provider. This medicine can affect the results of blood tests to match your blood type. These changes can last for up to 6 months after the final dose. Your healthcare provider will do blood tests to match your blood type before you start  treatment. Tell all of your healthcare providers that you are being treated with this medicine before receiving a blood transfusion. This medicine can affect the results of some tests used to determine treatment response; extra tests may be needed to evaluate response. Do not become pregnant while taking this medicine or for 3 months after stopping it. Women should inform their health care provider if they wish to become pregnant or think they might be pregnant. There is a potential for serious side effects to an unborn child. Talk to your health care provider for more information. Do not breast-feed an infant while taking this medicine. What side effects may I notice from receiving this medication? Side effects that you should report to your care team as soon as possible: Allergic reactions--skin rash, itching or hives, swelling of the face, lips, or tongue Blood clot--chest pain, shortness of breath, pain, swelling or warmth in the leg Blurred vision Fast, irregular heartbeat Infection--fever, chills, cough, sore throat, pain or trouble passing urine Injection reactions--dizziness, fast heartbeat, feeling faint or lightheaded, falls, headache, increase in blood pressure, nausea, vomiting, or wheezing or trouble breathing with loud or whistling sounds Low red blood cell counts--trouble breathing, feeling faint, lightheaded or falls, unusually weak or tired Unusual bleeding or bruising Side effects that usually do not require medical attention (report these to your care team  if they continue or are bothersome): Back pain Constipation Diarrhea Pain, tingling, numbness in the hands or feet Pain, redness, or irritation at site where injected Muscle cramp or pain Swelling of the ankles, feet, hands Tiredness Trouble sleeping This list may not describe all possible side effects. Call your doctor for medical advice about side effects. You may report side effects to FDA at 1-800-FDA-1088. Where  should I keep my medication? This drug is given in a hospital or clinic and will not be stored at home. NOTE: This sheet is a summary. It may not cover all possible information. If you have questions about this medicine, talk to your doctor, pharmacist, or health care provider.  2023 Elsevier/Gold Standard (2021-10-09 00:00:00)

## 2022-04-16 NOTE — Progress Notes (Signed)
[place] OFFICE PROGRESS NOTE   Diagnosis: Amyloidosis  INTERVAL HISTORY:   Mr. Durflinger is seen in an unscheduled visit to evaluate leg edema.  He is in the office today for daratumumab/Decadron.  He reports recent increase in bilateral lower extremity edema.  The edema is better today as compared to last week.  The edema improves overnight and worsens during the day.  No calf pain.  No change in baseline dyspnea.  No chest pain.  Objective:  Vital signs in last 24 hours:  Blood pressure 116/61, pulse 76, temperature 98.2 F (36.8 C), temperature source Tympanic, resp. rate 18, weight 196 lb 3.2 oz (89 kg), SpO2 97 %.    Vascular: Pitting edema lower leg bilaterally right greater than left.  Calves nontender.   Lab Results:  Lab Results  Component Value Date   WBC 8.0 04/16/2022   HGB 12.6 (L) 04/16/2022   HCT 39.2 04/16/2022   MCV 99.0 04/16/2022   PLT 153 04/16/2022   NEUTROABS 5.5 04/16/2022    Imaging:  US Venous Img Lower Bilateral  Result Date: 04/16/2022 CLINICAL DATA:  Bilateral lower extremity edema and history prior right upper extremity DVT. EXAM: BILATERAL LOWER EXTREMITY VENOUS DOPPLER ULTRASOUND TECHNIQUE: Gray-scale sonography with graded compression, as well as color Doppler and duplex ultrasound were performed to evaluate the lower extremity deep venous systems from the level of the common femoral vein and including the common femoral, femoral, profunda femoral, popliteal and calf veins including the posterior tibial, peroneal and gastrocnemius veins when visible. The superficial great saphenous vein was also interrogated. Spectral Doppler was utilized to evaluate flow at rest and with distal augmentation maneuvers in the common femoral, femoral and popliteal veins. COMPARISON:  None Available. FINDINGS: RIGHT LOWER EXTREMITY Common Femoral Vein: No evidence of thrombus. Normal compressibility, respiratory phasicity and response to augmentation.  Saphenofemoral Junction: No evidence of thrombus. Normal compressibility and flow on color Doppler imaging. Profunda Femoral Vein: No evidence of thrombus. Normal compressibility and flow on color Doppler imaging. Femoral Vein: No evidence of thrombus. Normal compressibility, respiratory phasicity and response to augmentation. Popliteal Vein: No evidence of thrombus. Normal compressibility, respiratory phasicity and response to augmentation. Calf Veins: Right peroneal vein thrombus identified at the level of the mid calf. Posterior tibial veins in the calf are normally patent. Superficial Great Saphenous Vein: No evidence of thrombus. Normal compressibility. Venous Reflux:  None. Other Findings: No evidence of superficial thrombophlebitis or abnormal fluid collection. LEFT LOWER EXTREMITY Common Femoral Vein: No evidence of thrombus. Normal compressibility, respiratory phasicity and response to augmentation. Saphenofemoral Junction: No evidence of thrombus. Normal compressibility and flow on color Doppler imaging. Profunda Femoral Vein: No evidence of thrombus. Normal compressibility and flow on color Doppler imaging. Femoral Vein: No evidence of thrombus. Normal compressibility, respiratory phasicity and response to augmentation. Popliteal Vein: No evidence of thrombus. Normal compressibility, respiratory phasicity and response to augmentation. Calf Veins: No evidence of thrombus. Normal compressibility and flow on color Doppler imaging. Superficial Great Saphenous Vein: No evidence of thrombus. Normal compressibility. Venous Reflux:  None. Other Findings: No evidence of superficial thrombophlebitis or abnormal fluid collection. IMPRESSION: 1. Right peroneal vein DVT at the level of the mid calf. 2. No evidence of left lower extremity DVT. Electronically Signed   By: Aletta Edouard M.D.   On: 04/16/2022 11:56    Medications: I have reviewed the patient's current medications.  Assessment/Plan: Amyloid  involving an eyelid biopsy 06/10/2011 "Bruising" at the eyelids and mouth: Likely  related to amyloidosis, persistent. Numbness and loss of vibratory sense at the fingertips: This predated Velcade-based therapy, but worsened. Now much improved following cervical spine surgery. Elevated serum free lambda light chains. The lambda light chains were lower on November 16 and slightly higher on 12/14/2011. Lambda light chain proteinuria. Bone marrow plasmacytosis: Variable increase in plasma cells noted on the bone marrow biopsy 07/29/2011 with plasma cells estimated to represent between 4% and 20% of the cellular population. Remote history of prostate cancer. Sleep apnea. Dyslipidemia. Report of pneumonia on 2 occasions in 2011. Admission to a hospital in [place], [place] October 2012 with "pneumonia." A [facility] x-[facility] at [facility] on November 2 was negative .   Low serum immunoglobulin G level. Plasma cell dyscrasia with associated amyloidosis.   Initiation of systemic therapy with Cytoxan, Velcade, and Decadron 08/12/2011. Cycle #2 was initiated on 09/16/2011. Cycle #3 was initiated on 10/15/2011. Velcade was placed on hold due to neuropathy. The serum free lambda light chains were decreased on October 08 2011. The serum free lambda light chains were slightly increased on 12/14/2011, lower on 12/29/2011 and 02/02/2012. Initiation of Revlimid/Decadron February 2013, cycle 2 started on 01/28/2012. High-dose melphalan chemotherapy followed by autologous stem cell infusion on 02/09/2013 Initiation of Velcade/Decadrontherapy 05/28/2013, he completed 2 cycles on a day 1, 4, 8, 11 schedule Improvement in the serum free lambda light chains following induction Velcade/Decadron   Initiation of weekly Velcade/Decadron on 07/16/2013, last treatment on 10/16/2013 Improvement in the serum free lambda light chains on 08/21/2013 and 09/18/2013 [facility]) Normal serum free lambda light chains on 12/13/2013 Mild  increase of the serum free lambda light chains beginning January 2016-stable  treatment on the Pronto study at [facility] beginning 12/17/2015, in  of study 11/23/2016 Started on phase 2b Prothena study-long-term safety/efficacy study 01/11/2017, discontinued May 2018  Doxycycline started 05/17/2017, changed to once daily August 2022 01/25/2022 bone marrow biopsy-hypercellular marrow with plasma cell neoplasm, 7%, plasma cells are predominantly small in size with occasional binucleation noted, no lymphocytosis, lambda predominant; 13q-/-13 detected, 1p32/1q21 detected, CCND1(BCL1)/IgH t(11;14) detected Daratumumab/Decadron initiated 01/28/2022 13.  Soft tissue infection/cellulitis right second finger 03/12/2022-Augmentin 14.  04/16/2022 right peroneal vein DVT at the level of the mid calf, favored acute by radiologist; Eliquis initiated      Disposition: Mr. Fedewa appears stable.  He was found to have an acute right peroneal vein DVT.  He understands the recommendation is for anticoagulation.  He will begin Eliquis 5 mg twice daily.  He understands the bleeding risk associated with a blood thinner.  He agrees to proceed.  He will continue anticoagulation for a 37-monthcourse, longer if he remains on daratumumab/dexamethasone.  He will follow-up as scheduled 04/29/2022.  He will contact the office in the interim with any problems.  Patient seen with Dr. SBenay Spice    LNed CardANP/GNP-BC   04/16/2022  1:32 PM  This was a shared visit with LNed Card  Mr. GVantinewas interviewed and examined.  He presents with increased edema in the right leg with right leg discomfort.  A Doppler was consistent with a right peroneal DVT.  He is at risk for DVT due to the plasma cell dyscrasia and current treatment.  I recommend apixaban anticoagulation.  He will remain on apixaban for at least 3 months and apixaban will be continued while he is on treatment for the amyloidosis.  I was present for greater than 50% of  today's visit.  I performed medical decision making.  BJulieanne Manson MD

## 2022-04-20 ENCOUNTER — Encounter: Payer: Self-pay | Admitting: *Deleted

## 2022-04-20 NOTE — Progress Notes (Signed)
Co pays for Eliquis 5 mg bid is $475/month and patient reports this is cost prohibitive. Notified oral oncology team pharmacy tech to pursue patient assistance program for future fills.

## 2022-04-22 ENCOUNTER — Other Ambulatory Visit: Payer: Self-pay | Admitting: Orthopedic Surgery

## 2022-04-22 DIAGNOSIS — L308 Other specified dermatitis: Secondary | ICD-10-CM | POA: Diagnosis not present

## 2022-04-22 DIAGNOSIS — R2231 Localized swelling, mass and lump, right upper limb: Secondary | ICD-10-CM | POA: Diagnosis not present

## 2022-04-25 ENCOUNTER — Other Ambulatory Visit: Payer: Self-pay | Admitting: Oncology

## 2022-04-29 ENCOUNTER — Other Ambulatory Visit: Payer: Self-pay

## 2022-04-29 ENCOUNTER — Inpatient Hospital Stay: Payer: Medicare Other

## 2022-04-29 ENCOUNTER — Inpatient Hospital Stay: Payer: Medicare Other | Attending: Oncology | Admitting: Oncology

## 2022-04-29 ENCOUNTER — Encounter: Payer: Self-pay | Admitting: *Deleted

## 2022-04-29 VITALS — BP 112/71 | HR 77 | Temp 98.3°F | Resp 18 | Wt 195.6 lb

## 2022-04-29 DIAGNOSIS — E785 Hyperlipidemia, unspecified: Secondary | ICD-10-CM | POA: Diagnosis not present

## 2022-04-29 DIAGNOSIS — R5381 Other malaise: Secondary | ICD-10-CM | POA: Insufficient documentation

## 2022-04-29 DIAGNOSIS — Z7901 Long term (current) use of anticoagulants: Secondary | ICD-10-CM | POA: Diagnosis not present

## 2022-04-29 DIAGNOSIS — Z8546 Personal history of malignant neoplasm of prostate: Secondary | ICD-10-CM | POA: Diagnosis not present

## 2022-04-29 DIAGNOSIS — E859 Amyloidosis, unspecified: Secondary | ICD-10-CM | POA: Insufficient documentation

## 2022-04-29 DIAGNOSIS — Z86718 Personal history of other venous thrombosis and embolism: Secondary | ICD-10-CM | POA: Insufficient documentation

## 2022-04-29 DIAGNOSIS — Z86711 Personal history of pulmonary embolism: Secondary | ICD-10-CM | POA: Insufficient documentation

## 2022-04-29 DIAGNOSIS — I824Z1 Acute embolism and thrombosis of unspecified deep veins of right distal lower extremity: Secondary | ICD-10-CM | POA: Diagnosis not present

## 2022-04-29 DIAGNOSIS — Z79899 Other long term (current) drug therapy: Secondary | ICD-10-CM | POA: Diagnosis not present

## 2022-04-29 DIAGNOSIS — E8581 Light chain (AL) amyloidosis: Secondary | ICD-10-CM

## 2022-04-29 DIAGNOSIS — G4730 Sleep apnea, unspecified: Secondary | ICD-10-CM | POA: Insufficient documentation

## 2022-04-29 LAB — CBC WITH DIFFERENTIAL (CANCER CENTER ONLY)
Abs Immature Granulocytes: 0.02 10*3/uL (ref 0.00–0.07)
Basophils Absolute: 0 10*3/uL (ref 0.0–0.1)
Basophils Relative: 0 %
Eosinophils Absolute: 0.1 10*3/uL (ref 0.0–0.5)
Eosinophils Relative: 1 %
HCT: 38.2 % — ABNORMAL LOW (ref 39.0–52.0)
Hemoglobin: 12.7 g/dL — ABNORMAL LOW (ref 13.0–17.0)
Immature Granulocytes: 0 %
Lymphocytes Relative: 23 %
Lymphs Abs: 1.9 10*3/uL (ref 0.7–4.0)
MCH: 32.9 pg (ref 26.0–34.0)
MCHC: 33.2 g/dL (ref 30.0–36.0)
MCV: 99 fL (ref 80.0–100.0)
Monocytes Absolute: 0.8 10*3/uL (ref 0.1–1.0)
Monocytes Relative: 10 %
Neutro Abs: 5.3 10*3/uL (ref 1.7–7.7)
Neutrophils Relative %: 66 %
Platelet Count: 151 10*3/uL (ref 150–400)
RBC: 3.86 MIL/uL — ABNORMAL LOW (ref 4.22–5.81)
RDW: 14 % (ref 11.5–15.5)
WBC Count: 8.1 10*3/uL (ref 4.0–10.5)
nRBC: 0 % (ref 0.0–0.2)

## 2022-04-29 MED ORDER — ACETAMINOPHEN 325 MG PO TABS
650.0000 mg | ORAL_TABLET | Freq: Once | ORAL | Status: AC
  Administered 2022-04-29: 650 mg via ORAL
  Filled 2022-04-29: qty 2

## 2022-04-29 MED ORDER — DIPHENHYDRAMINE HCL 25 MG PO CAPS
50.0000 mg | ORAL_CAPSULE | Freq: Once | ORAL | Status: AC
  Administered 2022-04-29: 50 mg via ORAL
  Filled 2022-04-29: qty 2

## 2022-04-29 MED ORDER — DARATUMUMAB-HYALURONIDASE-FIHJ 1800-30000 MG-UT/15ML ~~LOC~~ SOLN
1800.0000 mg | Freq: Once | SUBCUTANEOUS | Status: AC
  Administered 2022-04-29: 1800 mg via SUBCUTANEOUS
  Filled 2022-04-29: qty 15

## 2022-04-29 MED ORDER — DEXAMETHASONE 4 MG PO TABS
20.0000 mg | ORAL_TABLET | Freq: Once | ORAL | Status: AC
  Administered 2022-04-29: 20 mg via ORAL
  Filled 2022-04-29: qty 5

## 2022-04-29 NOTE — Patient Instructions (Signed)
[place]   Discharge Instructions: Thank you for choosing [facility] to provide your oncology and hematology care.   If you have a lab appointment with the [facility], please go directly to the [facility] and check in at the registration area.   Wear comfortable clothing and clothing appropriate for easy access to any Portacath or PICC line.   We strive to give you quality time with your provider. You may need to reschedule your appointment if you arrive late (15 or more minutes).  Arriving late affects you and other patients whose appointments are after yours.  Also, if you miss three or more appointments without notifying the office, you may be dismissed from the clinic at the provider's discretion.      For prescription refill requests, have your pharmacy contact our office and allow 72 hours for refills to be completed.    Today you received the following chemotherapy and/or immunotherapy agents Daratumumab-hyaluronidase-fihj (DARZALEX FASPRO).      To help prevent nausea and vomiting after your treatment, we encourage you to take your nausea medication as directed.  BELOW ARE SYMPTOMS THAT SHOULD BE REPORTED IMMEDIATELY: *FEVER GREATER THAN 100.4 F (38 C) OR HIGHER *CHILLS OR SWEATING *NAUSEA AND VOMITING THAT IS NOT CONTROLLED WITH YOUR NAUSEA MEDICATION *UNUSUAL SHORTNESS OF BREATH *UNUSUAL BRUISING OR BLEEDING *URINARY PROBLEMS (pain or burning when urinating, or frequent urination) *BOWEL PROBLEMS (unusual diarrhea, constipation, pain near the anus) TENDERNESS IN MOUTH AND THROAT WITH OR WITHOUT PRESENCE OF ULCERS (sore throat, sores in mouth, or a toothache) UNUSUAL RASH, SWELLING OR PAIN  UNUSUAL VAGINAL DISCHARGE OR ITCHING   Items with * indicate a potential emergency and should be followed up as soon as possible or go to the Emergency Department if any problems should occur.  Please show the CHEMOTHERAPY ALERT CARD or  IMMUNOTHERAPY ALERT CARD at check-in to the Emergency Department and triage nurse.  Should you have questions after your visit or need to cancel or reschedule your appointment, please contact [facility]  Dept: [phone]  and follow the prompts.  Office hours are 8:00 a.m. to 4:30 p.m. Monday - Friday. Please note that voicemails left after 4:00 p.m. may not be returned until the following business day.  We are closed weekends and major holidays. You have access to a nurse at all times for urgent questions. Please call the main number to the clinic Dept: [phone] and follow the prompts.   For any non-urgent questions, you may also contact your provider using MyChart. We now offer e-Visits for anyone 20 and older to request care online for non-urgent symptoms. For details visit [email or web address].   Also download the MyChart app! Go to the app store, search "MyChart", open the app, select [facility], and log in with your MyChart username and password.  Due to Covid, a mask is required upon entering the hospital/clinic. If you do not have a mask, one will be given to you upon arrival. For doctor visits, patients may have 1 support person aged 64 or older with them. For treatment visits, patients cannot have anyone with them due to current Covid guidelines and our immunocompromised population.   Daratumumab; Hyaluronidase Injection What is this medication? DARATUMUMAB; HYALURONIDASE (dar a toom ue mab / hye al ur ON i dase) is a monoclonal antibody. Hyaluronidase is used to improve the effects of daratumumab. It treats certain types of cancer. Some of the cancers treated are  multiple myeloma and light-chain amyloidosis. This medicine may be used for other purposes; ask your health care provider or pharmacist if you have questions. COMMON BRAND NAME(S): DARZALEX FASPRO What should I tell my care team before I take this medication? They need to know if you have any  of these conditions: heart disease infection especially a viral infection such as chickenpox, cold sores, herpes, or hepatitis B lung or breathing disease an unusual or allergic reaction to daratumumab, hyaluronidase, other medicines, foods, dyes, or preservatives pregnant or trying to get pregnant breast-feeding How should I use this medication? This medicine is for injection under the skin. It is given by a health care professional in a hospital or clinic setting. Talk to your pediatrician regarding the use of this medicine in children. Special care may be needed. Overdosage: If you think you have taken too much of this medicine contact a poison control center or emergency room at once. NOTE: This medicine is only for you. Do not share this medicine with others. What if I miss a dose? Keep appointments for follow-up doses as directed. It is important not to miss your dose. Call your doctor or health care professional if you are unable to keep an appointment. What may interact with this medication? Interactions have not been studied. This list may not describe all possible interactions. Give your health care provider a list of all the medicines, herbs, non-prescription drugs, or dietary supplements you use. Also tell them if you smoke, drink alcohol, or use illegal drugs. Some items may interact with your medicine. What should I watch for while using this medication? Your condition will be monitored carefully while you are receiving this medicine. This medicine can cause serious allergic reactions. To reduce your risk, your health care provider may give you other medicine to take before receiving this one. Be sure to follow the directions from your health care provider. This medicine can affect the results of blood tests to match your blood type. These changes can last for up to 6 months after the final dose. Your healthcare provider will do blood tests to match your blood type before you start  treatment. Tell all of your healthcare providers that you are being treated with this medicine before receiving a blood transfusion. This medicine can affect the results of some tests used to determine treatment response; extra tests may be needed to evaluate response. Do not become pregnant while taking this medicine or for 3 months after stopping it. Women should inform their health care provider if they wish to become pregnant or think they might be pregnant. There is a potential for serious side effects to an unborn child. Talk to your health care provider for more information. Do not breast-feed an infant while taking this medicine. What side effects may I notice from receiving this medication? Side effects that you should report to your care team as soon as possible: Allergic reactions--skin rash, itching or hives, swelling of the face, lips, or tongue Blood clot--chest pain, shortness of breath, pain, swelling or warmth in the leg Blurred vision Fast, irregular heartbeat Infection--fever, chills, cough, sore throat, pain or trouble passing urine Injection reactions--dizziness, fast heartbeat, feeling faint or lightheaded, falls, headache, increase in blood pressure, nausea, vomiting, or wheezing or trouble breathing with loud or whistling sounds Low red blood cell counts--trouble breathing, feeling faint, lightheaded or falls, unusually weak or tired Unusual bleeding or bruising Side effects that usually do not require medical attention (report these to your care team  if they continue or are bothersome): Back pain Constipation Diarrhea Pain, tingling, numbness in the hands or feet Pain, redness, or irritation at site where injected Muscle cramp or pain Swelling of the ankles, feet, hands Tiredness Trouble sleeping This list may not describe all possible side effects. Call your doctor for medical advice about side effects. You may report side effects to FDA at 1-800-FDA-1088. Where  should I keep my medication? This drug is given in a hospital or clinic and will not be stored at home. NOTE: This sheet is a summary. It may not cover all possible information. If you have questions about this medicine, talk to your doctor, pharmacist, or health care provider.  2023 Elsevier/Gold Standard (2021-10-09 00:00:00)

## 2022-04-29 NOTE — Progress Notes (Signed)
[facility] OFFICE PROGRESS NOTE   Diagnosis: Amyloidosis  INTERVAL HISTORY:   Brent Allison completed in the treatment with daratumumab on 04/16/2022.  No symptom of allergic reaction.  He continues to have malaise.  He is now maintained on apixaban for the right leg DVT.  No change in the right leg.  No new complaint. He saw orthopedics for the infection at the right finger.  He reports no foreign body was found.  He is scheduled to have suture removal today.  Objective:  Vital signs in last 24 hours:  Blood pressure 112/71, pulse 77, temperature 98.3 F (36.8 C), temperature source Oral, resp. rate 18, weight 195 lb 9.6 oz (88.7 kg), SpO2 100 %.    HEENT: No thrush or ulcers, few petechiae at the right buccal mucosa Resp: Lungs clear bilaterally Cardio: Regular rate and rhythm GI: No hepatosplenomegaly Vascular: Right lower leg is slightly larger than the left side  Lab Results:  Lab Results  Component Value Date   WBC 8.1 04/29/2022   HGB 12.7 (L) 04/29/2022   HCT 38.2 (L) 04/29/2022   MCV 99.0 04/29/2022   PLT 151 04/29/2022   NEUTROABS 5.3 04/29/2022    CMP  Lab Results  Component Value Date   NA 140 04/16/2022   K 4.3 04/16/2022   CL 106 04/16/2022   CO2 26 04/16/2022   GLUCOSE 103 (H) 04/16/2022   BUN 16 04/16/2022   CREATININE 1.35 (H) 04/16/2022   CALCIUM 9.1 04/16/2022   PROT 5.4 (L) 04/16/2022   ALBUMIN 3.5 04/16/2022   AST 16 04/16/2022   ALT 12 04/16/2022   ALKPHOS 67 04/16/2022   BILITOT 0.6 04/16/2022   GFRNONAA 54 (L) 04/16/2022   GFRAA >60 04/24/2019     Medications: I have reviewed the patient's current medications.   Assessment/Plan: Amyloid involving an eyelid biopsy 06/10/2011 "Bruising" at the eyelids and mouth: Likely related to amyloidosis, persistent. Numbness and loss of vibratory sense at the fingertips: This predated Velcade-based therapy, but worsened. Now much improved following cervical spine  surgery. Elevated serum free lambda light chains. The lambda light chains were lower on November 16 and slightly higher on 12/14/2011. Lambda light chain proteinuria. Bone marrow plasmacytosis: Variable increase in plasma cells noted on the bone marrow biopsy 07/29/2011 with plasma cells estimated to represent between 4% and 20% of the cellular population. Remote history of prostate cancer. Sleep apnea. Dyslipidemia. Report of pneumonia on 2 occasions in 2011. Admission to a hospital in [place], [place] October 2012 with "pneumonia." A chest x-[facility] at [facility] on November 2 was negative .   Low serum immunoglobulin G level. Plasma cell dyscrasia with associated amyloidosis.   Initiation of systemic therapy with Cytoxan, Velcade, and Decadron 08/12/2011. Cycle #2 was initiated on 09/16/2011. Cycle #3 was initiated on 10/15/2011. Velcade was placed on hold due to neuropathy. The serum free lambda light chains were decreased on October 08 2011. The serum free lambda light chains were slightly increased on 12/14/2011, lower on 12/29/2011 and 02/02/2012. Initiation of Revlimid/Decadron February 2013, cycle 2 started on 01/28/2012. High-dose melphalan chemotherapy followed by autologous stem cell infusion on 02/09/2013 Initiation of Velcade/Decadrontherapy 05/28/2013, he completed 2 cycles on a day 1, 4, 8, 11 schedule Improvement in the serum free lambda light chains following induction Velcade/Decadron   Initiation of weekly Velcade/Decadron on 07/16/2013, last treatment on 10/16/2013 Improvement in the serum free lambda light chains on 08/21/2013 and 09/18/2013 [facility]) Normal serum free lambda light chains on 12/13/2013  Mild increase of the serum free lambda light chains beginning January 2016-stable  treatment on the Pronto study at [facility] beginning 12/17/2015, in  of study 11/23/2016 Started on phase 2b Prothena study-long-term safety/efficacy study 01/11/2017, discontinued May 2018   Doxycycline started 05/17/2017, changed to once daily August 2022 01/25/2022 bone marrow biopsy-hypercellular marrow with plasma cell neoplasm, 7%, plasma cells are predominantly small in size with occasional binucleation noted, no lymphocytosis, lambda predominant; 13q-/-13 detected, 1p32/1q21 detected, CCND1(BCL1)/IgH t(11;14) detected Daratumumab/Decadron initiated 01/28/2022 Serum free light chains lower 04/02/2022 13.  Soft tissue infection/cellulitis right second finger 03/12/2022-Augmentin 14.  04/16/2022 right peroneal vein DVT at the level of the mid calf, favored acute by radiologist; Eliquis initiated       Disposition: Brent Allison are stable.  He will continue daratumumab/Decadron for treatment of amyloidosis.  The light chains were lower last month.  He is now maintained on every 2-week daratumumab.  He will return for an office visit in 6 weeks. He continues apixaban anticoagulation for the right lower extremity DVT.  Betsy Coder, MD  04/29/2022  9:08 AM

## 2022-04-29 NOTE — Progress Notes (Signed)
Patient seen by Dr. [name] today  Vitals are within treatment parameters.  Labs reviewed by Dr. [name] and are within treatment parameters. Per Dr. [name]: Does not need CMP today  Per physician team, patient is ready for treatment and there are NO modifications to the treatment plan.

## 2022-04-30 ENCOUNTER — Other Ambulatory Visit: Payer: Self-pay | Admitting: *Deleted

## 2022-04-30 DIAGNOSIS — E8581 Light chain (AL) amyloidosis: Secondary | ICD-10-CM

## 2022-05-10 ENCOUNTER — Encounter: Payer: Self-pay | Admitting: *Deleted

## 2022-05-13 ENCOUNTER — Inpatient Hospital Stay: Payer: Medicare Other

## 2022-05-13 VITALS — BP 116/69 | HR 71 | Temp 97.7°F | Resp 19 | Ht 72.0 in | Wt 195.4 lb

## 2022-05-13 DIAGNOSIS — E8581 Light chain (AL) amyloidosis: Secondary | ICD-10-CM | POA: Diagnosis not present

## 2022-05-13 DIAGNOSIS — Z86718 Personal history of other venous thrombosis and embolism: Secondary | ICD-10-CM | POA: Diagnosis not present

## 2022-05-13 DIAGNOSIS — I824Z1 Acute embolism and thrombosis of unspecified deep veins of right distal lower extremity: Secondary | ICD-10-CM | POA: Diagnosis not present

## 2022-05-13 DIAGNOSIS — E859 Amyloidosis, unspecified: Secondary | ICD-10-CM | POA: Diagnosis not present

## 2022-05-13 DIAGNOSIS — Z86711 Personal history of pulmonary embolism: Secondary | ICD-10-CM | POA: Diagnosis not present

## 2022-05-13 DIAGNOSIS — Z7901 Long term (current) use of anticoagulants: Secondary | ICD-10-CM | POA: Diagnosis not present

## 2022-05-13 LAB — CBC WITH DIFFERENTIAL (CANCER CENTER ONLY)
Abs Immature Granulocytes: 0.02 10*3/uL (ref 0.00–0.07)
Basophils Absolute: 0 10*3/uL (ref 0.0–0.1)
Basophils Relative: 0 %
Eosinophils Absolute: 0.2 10*3/uL (ref 0.0–0.5)
Eosinophils Relative: 2 %
HCT: 39.1 % (ref 39.0–52.0)
Hemoglobin: 12.7 g/dL — ABNORMAL LOW (ref 13.0–17.0)
Immature Granulocytes: 0 %
Lymphocytes Relative: 21 %
Lymphs Abs: 1.9 10*3/uL (ref 0.7–4.0)
MCH: 32.1 pg (ref 26.0–34.0)
MCHC: 32.5 g/dL (ref 30.0–36.0)
MCV: 98.7 fL (ref 80.0–100.0)
Monocytes Absolute: 0.8 10*3/uL (ref 0.1–1.0)
Monocytes Relative: 9 %
Neutro Abs: 5.9 10*3/uL (ref 1.7–7.7)
Neutrophils Relative %: 68 %
Platelet Count: 158 10*3/uL (ref 150–400)
RBC: 3.96 MIL/uL — ABNORMAL LOW (ref 4.22–5.81)
RDW: 13.7 % (ref 11.5–15.5)
WBC Count: 8.8 10*3/uL (ref 4.0–10.5)
nRBC: 0 % (ref 0.0–0.2)

## 2022-05-13 MED ORDER — ACETAMINOPHEN 325 MG PO TABS
650.0000 mg | ORAL_TABLET | Freq: Once | ORAL | Status: AC
  Administered 2022-05-13: 650 mg via ORAL
  Filled 2022-05-13: qty 2

## 2022-05-13 MED ORDER — DEXAMETHASONE 4 MG PO TABS
20.0000 mg | ORAL_TABLET | Freq: Once | ORAL | Status: AC
  Administered 2022-05-13: 20 mg via ORAL
  Filled 2022-05-13: qty 5

## 2022-05-13 MED ORDER — DARATUMUMAB-HYALURONIDASE-FIHJ 1800-30000 MG-UT/15ML ~~LOC~~ SOLN
1800.0000 mg | Freq: Once | SUBCUTANEOUS | Status: AC
  Administered 2022-05-13: 1800 mg via SUBCUTANEOUS
  Filled 2022-05-13: qty 15

## 2022-05-13 MED ORDER — DIPHENHYDRAMINE HCL 25 MG PO CAPS
50.0000 mg | ORAL_CAPSULE | Freq: Once | ORAL | Status: AC
  Administered 2022-05-13: 50 mg via ORAL
  Filled 2022-05-13: qty 2

## 2022-05-13 NOTE — Patient Instructions (Signed)
[facility]   Discharge Instructions: Thank you for choosing [facility] to provide your oncology and hematology care.   If you have a lab appointment with the Cancer Center, please go directly to the Cancer Center and check in at the registration area.   Wear comfortable clothing and clothing appropriate for easy access to any Portacath or PICC line.   We strive to give you quality time with your provider. You may need to reschedule your appointment if you arrive late (15 or more minutes).  Arriving late affects you and other patients whose appointments are after yours.  Also, if you miss three or more appointments without notifying the office, you may be dismissed from the clinic at the provider's discretion.      For prescription refill requests, have your pharmacy contact our office and allow 72 hours for refills to be completed.    Today you received the following chemotherapy and/or immunotherapy agents Darzalex Faspro.      To help prevent nausea and vomiting after your treatment, we encourage you to take your nausea medication as directed.  BELOW ARE SYMPTOMS THAT SHOULD BE REPORTED IMMEDIATELY: *FEVER GREATER THAN 100.4 F (38 C) OR HIGHER *CHILLS OR SWEATING *NAUSEA AND VOMITING THAT IS NOT CONTROLLED WITH YOUR NAUSEA MEDICATION *UNUSUAL SHORTNESS OF BREATH *UNUSUAL BRUISING OR BLEEDING *URINARY PROBLEMS (pain or burning when urinating, or frequent urination) *BOWEL PROBLEMS (unusual diarrhea, constipation, pain near the anus) TENDERNESS IN MOUTH AND THROAT WITH OR WITHOUT PRESENCE OF ULCERS (sore throat, sores in mouth, or a toothache) UNUSUAL RASH, SWELLING OR PAIN  UNUSUAL VAGINAL DISCHARGE OR ITCHING   Items with * indicate a potential emergency and should be followed up as soon as possible or go to the Emergency Department if any problems should occur.  Please show the CHEMOTHERAPY ALERT CARD or IMMUNOTHERAPY ALERT CARD at  check-in to the Emergency Department and triage nurse.  Should you have questions after your visit or need to cancel or reschedule your appointment, please contact [facility]  Dept: [phone]  and follow the prompts.  Office hours are 8:00 a.m. to 4:30 p.m. Monday - Friday. Please note that voicemails left after 4:00 p.m. may not be returned until the following business day.  We are closed weekends and major holidays. You have access to a nurse at all times for urgent questions. Please call the main number to the clinic Dept: [phone] and follow the prompts.   For any non-urgent questions, you may also contact your provider using MyChart. We now offer e-Visits for anyone 18 and older to request care online for non-urgent symptoms. For details visit [email or web address].   Also download the MyChart app! Go to the app store, search "MyChart", open the app, select , and log in with your MyChart username and password.  Masks are optional in the cancer centers. If you would like for your care team to wear a mask while they are taking care of you, please let them know. For doctor visits, patients may have with them one support person who is at least 77 years old. At this time, visitors are not allowed in the infusion area.  Daratumumab; Hyaluronidase Injection What is this medication? DARATUMUMAB; HYALURONIDASE (dar a toom ue mab / hye al ur ON i dase) is a monoclonal antibody. Hyaluronidase is used to improve the effects of daratumumab. It treats certain types of cancer. Some of the cancers treated are multiple myeloma   and light-chain amyloidosis. This medicine may be used for other purposes; ask your health care provider or pharmacist if you have questions. COMMON BRAND NAME(S): DARZALEX FASPRO What should I tell my care team before I take this medication? They need to know if you have any of these conditions: heart disease infection especially a viral  infection such as chickenpox, cold sores, herpes, or hepatitis B lung or breathing disease an unusual or allergic reaction to daratumumab, hyaluronidase, other medicines, foods, dyes, or preservatives pregnant or trying to get pregnant breast-feeding How should I use this medication? This medicine is for injection under the skin. It is given by a health care professional in a hospital or clinic setting. Talk to your pediatrician regarding the use of this medicine in children. Special care may be needed. Overdosage: If you think you have taken too much of this medicine contact a poison control center or emergency room at once. NOTE: This medicine is only for you. Do not share this medicine with others. What if I miss a dose? Keep appointments for follow-up doses as directed. It is important not to miss your dose. Call your doctor or health care professional if you are unable to keep an appointment. What may interact with this medication? Interactions have not been studied. This list may not describe all possible interactions. Give your health care provider a list of all the medicines, herbs, non-prescription drugs, or dietary supplements you use. Also tell them if you smoke, drink alcohol, or use illegal drugs. Some items may interact with your medicine. What should I watch for while using this medication? Your condition will be monitored carefully while you are receiving this medicine. This medicine can cause serious allergic reactions. To reduce your risk, your health care provider may give you other medicine to take before receiving this one. Be sure to follow the directions from your health care provider. This medicine can affect the results of blood tests to match your blood type. These changes can last for up to 6 months after the final dose. Your healthcare provider will do blood tests to match your blood type before you start treatment. Tell all of your healthcare providers that you are  being treated with this medicine before receiving a blood transfusion. This medicine can affect the results of some tests used to determine treatment response; extra tests may be needed to evaluate response. Do not become pregnant while taking this medicine or for 3 months after stopping it. Women should inform their health care provider if they wish to become pregnant or think they might be pregnant. There is a potential for serious side effects to an unborn child. Talk to your health care provider for more information. Do not breast-feed an infant while taking this medicine. What side effects may I notice from receiving this medication? Side effects that you should report to your care team as soon as possible: Allergic reactions--skin rash, itching or hives, swelling of the face, lips, or tongue Blood clot--chest pain, shortness of breath, pain, swelling or warmth in the leg Blurred vision Fast, irregular heartbeat Infection--fever, chills, cough, sore throat, pain or trouble passing urine Injection reactions--dizziness, fast heartbeat, feeling faint or lightheaded, falls, headache, increase in blood pressure, nausea, vomiting, or wheezing or trouble breathing with loud or whistling sounds Low red blood cell counts--trouble breathing, feeling faint, lightheaded or falls, unusually weak or tired Unusual bleeding or bruising Side effects that usually do not require medical attention (report these to your care team if they  continue or are bothersome): Back pain Constipation Diarrhea Pain, tingling, numbness in the hands or feet Pain, redness, or irritation at site where injected Muscle cramp or pain Swelling of the ankles, feet, hands Tiredness Trouble sleeping This list may not describe all possible side effects. Call your doctor for medical advice about side effects. You may report side effects to FDA at 1-800-FDA-1088. Where should I keep my medication? This drug is given in a hospital or  clinic and will not be stored at home. NOTE: This sheet is a summary. It may not cover all possible information. If you have questions about this medicine, talk to your doctor, pharmacist, or health care provider.  2023 Elsevier/Gold Standard (2021-10-09 00:00:00)

## 2022-05-21 DIAGNOSIS — I700 Atherosclerosis of aorta: Secondary | ICD-10-CM | POA: Diagnosis not present

## 2022-05-21 DIAGNOSIS — I5032 Chronic diastolic (congestive) heart failure: Secondary | ICD-10-CM | POA: Diagnosis not present

## 2022-05-22 ENCOUNTER — Other Ambulatory Visit: Payer: Self-pay | Admitting: Oncology

## 2022-05-27 ENCOUNTER — Telehealth: Payer: Self-pay | Admitting: *Deleted

## 2022-05-27 ENCOUNTER — Inpatient Hospital Stay: Payer: Medicare Other | Attending: Oncology

## 2022-05-27 ENCOUNTER — Inpatient Hospital Stay: Payer: Medicare Other

## 2022-05-27 VITALS — BP 122/70 | HR 79 | Temp 98.7°F | Resp 20 | Ht 72.0 in | Wt 194.1 lb

## 2022-05-27 DIAGNOSIS — E859 Amyloidosis, unspecified: Secondary | ICD-10-CM | POA: Diagnosis not present

## 2022-05-27 DIAGNOSIS — I824Z1 Acute embolism and thrombosis of unspecified deep veins of right distal lower extremity: Secondary | ICD-10-CM | POA: Diagnosis not present

## 2022-05-27 DIAGNOSIS — Z86711 Personal history of pulmonary embolism: Secondary | ICD-10-CM | POA: Diagnosis not present

## 2022-05-27 DIAGNOSIS — E8581 Light chain (AL) amyloidosis: Secondary | ICD-10-CM

## 2022-05-27 DIAGNOSIS — Z7901 Long term (current) use of anticoagulants: Secondary | ICD-10-CM | POA: Diagnosis not present

## 2022-05-27 DIAGNOSIS — Z86718 Personal history of other venous thrombosis and embolism: Secondary | ICD-10-CM | POA: Insufficient documentation

## 2022-05-27 DIAGNOSIS — Z79899 Other long term (current) drug therapy: Secondary | ICD-10-CM | POA: Diagnosis not present

## 2022-05-27 DIAGNOSIS — Z8546 Personal history of malignant neoplasm of prostate: Secondary | ICD-10-CM | POA: Insufficient documentation

## 2022-05-27 DIAGNOSIS — E785 Hyperlipidemia, unspecified: Secondary | ICD-10-CM | POA: Insufficient documentation

## 2022-05-27 DIAGNOSIS — G4730 Sleep apnea, unspecified: Secondary | ICD-10-CM | POA: Diagnosis not present

## 2022-05-27 LAB — CBC WITH DIFFERENTIAL (CANCER CENTER ONLY)
Abs Immature Granulocytes: 0.03 10*3/uL (ref 0.00–0.07)
Basophils Absolute: 0 10*3/uL (ref 0.0–0.1)
Basophils Relative: 0 %
Eosinophils Absolute: 0.2 10*3/uL (ref 0.0–0.5)
Eosinophils Relative: 2 %
HCT: 38.6 % — ABNORMAL LOW (ref 39.0–52.0)
Hemoglobin: 12.8 g/dL — ABNORMAL LOW (ref 13.0–17.0)
Immature Granulocytes: 0 %
Lymphocytes Relative: 17 %
Lymphs Abs: 1.8 10*3/uL (ref 0.7–4.0)
MCH: 32.7 pg (ref 26.0–34.0)
MCHC: 33.2 g/dL (ref 30.0–36.0)
MCV: 98.5 fL (ref 80.0–100.0)
Monocytes Absolute: 1 10*3/uL (ref 0.1–1.0)
Monocytes Relative: 9 %
Neutro Abs: 7.4 10*3/uL (ref 1.7–7.7)
Neutrophils Relative %: 72 %
Platelet Count: 147 10*3/uL — ABNORMAL LOW (ref 150–400)
RBC: 3.92 MIL/uL — ABNORMAL LOW (ref 4.22–5.81)
RDW: 13.6 % (ref 11.5–15.5)
WBC Count: 10.4 10*3/uL (ref 4.0–10.5)
nRBC: 0 % (ref 0.0–0.2)

## 2022-05-27 MED ORDER — DEXAMETHASONE 4 MG PO TABS
20.0000 mg | ORAL_TABLET | Freq: Once | ORAL | Status: AC
  Administered 2022-05-27: 20 mg via ORAL
  Filled 2022-05-27: qty 5

## 2022-05-27 MED ORDER — DARATUMUMAB-HYALURONIDASE-FIHJ 1800-30000 MG-UT/15ML ~~LOC~~ SOLN
1800.0000 mg | Freq: Once | SUBCUTANEOUS | Status: AC
  Administered 2022-05-27: 1800 mg via SUBCUTANEOUS
  Filled 2022-05-27: qty 15

## 2022-05-27 MED ORDER — DIPHENHYDRAMINE HCL 25 MG PO CAPS
50.0000 mg | ORAL_CAPSULE | Freq: Once | ORAL | Status: AC
  Administered 2022-05-27: 50 mg via ORAL
  Filled 2022-05-27: qty 2

## 2022-05-27 MED ORDER — ACETAMINOPHEN 325 MG PO TABS
650.0000 mg | ORAL_TABLET | Freq: Once | ORAL | Status: AC
  Administered 2022-05-27: 650 mg via ORAL
  Filled 2022-05-27: qty 2

## 2022-05-27 NOTE — Telephone Encounter (Signed)
Called to f/u on status of his patient assistance application for Eliquis. He has not sent the paperwork in yet. Plans to send it out this week.

## 2022-05-27 NOTE — Patient Instructions (Signed)
[facility]   Discharge Instructions: Thank you for choosing [facility] to provide your oncology and hematology care.   If you have a lab appointment with the [facility], please go directly to the [facility] and check in at the registration area.   Wear comfortable clothing and clothing appropriate for easy access to any Portacath or PICC line.   We strive to give you quality time with your provider. You may need to reschedule your appointment if you arrive late (15 or more minutes).  Arriving late affects you and other patients whose appointments are after yours.  Also, if you miss three or more appointments without notifying the office, you may be dismissed from the clinic at the provider's discretion.      For prescription refill requests, have your pharmacy contact our office and allow 72 hours for refills to be completed.    Today you received the following chemotherapy and/or immunotherapy agents Daratumumab-Hyaluronidase-fihj (DARZALEX FASPRO).      To help prevent nausea and vomiting after your treatment, we encourage you to take your nausea medication as directed.  BELOW ARE SYMPTOMS THAT SHOULD BE REPORTED IMMEDIATELY: *FEVER GREATER THAN 100.4 F (38 C) OR HIGHER *CHILLS OR SWEATING *NAUSEA AND VOMITING THAT IS NOT CONTROLLED WITH YOUR NAUSEA MEDICATION *UNUSUAL SHORTNESS OF BREATH *UNUSUAL BRUISING OR BLEEDING *URINARY PROBLEMS (pain or burning when urinating, or frequent urination) *BOWEL PROBLEMS (unusual diarrhea, constipation, pain near the anus) TENDERNESS IN MOUTH AND THROAT WITH OR WITHOUT PRESENCE OF ULCERS (sore throat, sores in mouth, or a toothache) UNUSUAL RASH, SWELLING OR PAIN  UNUSUAL VAGINAL DISCHARGE OR ITCHING   Items with * indicate a potential emergency and should be followed up as soon as possible or go to the Emergency Department if any problems should occur.  Please show the CHEMOTHERAPY ALERT CARD or  IMMUNOTHERAPY ALERT CARD at check-in to the Emergency Department and triage nurse.  Should you have questions after your visit or need to cancel or reschedule your appointment, please contact [facility]  Dept: [phone]  and follow the prompts.  Office hours are 8:00 a.m. to 4:30 p.m. Monday - Friday. Please note that voicemails left after 4:00 p.m. may not be returned until the following business day.  We are closed weekends and major holidays. You have access to a nurse at all times for urgent questions. Please call the main number to the clinic Dept: [phone] and follow the prompts.   For any non-urgent questions, you may also contact your provider using MyChart. We now offer e-Visits for anyone 67 and older to request care online for non-urgent symptoms. For details visit [email or web address].   Also download the MyChart app! Go to the app store, search "MyChart", open the app, select [facility], and log in with your MyChart username and password.  Masks are optional in the cancer centers. If you would like for your care team to wear a mask while they are taking care of you, please let them know. For doctor visits, patients may have with them one support person who is at least 77 years old. At this time, visitors are not allowed in the infusion area.  Daratumumab; Hyaluronidase Injection What is this medication? DARATUMUMAB; HYALURONIDASE (dar a toom ue mab / hye al ur ON i dase) is a monoclonal antibody. Hyaluronidase is used to improve the effects of daratumumab. It treats certain types of cancer. Some of the cancers treated are multiple  myeloma and light-chain amyloidosis. This medicine may be used for other purposes; ask your health care provider or pharmacist if you have questions. COMMON BRAND NAME(S): DARZALEX FASPRO What should I tell my care team before I take this medication? They need to know if you have any of these conditions: heart  disease infection especially a viral infection such as chickenpox, cold sores, herpes, or hepatitis B lung or breathing disease an unusual or allergic reaction to daratumumab, hyaluronidase, other medicines, foods, dyes, or preservatives pregnant or trying to get pregnant breast-feeding How should I use this medication? This medicine is for injection under the skin. It is given by a health care professional in a hospital or clinic setting. Talk to your pediatrician regarding the use of this medicine in children. Special care may be needed. Overdosage: If you think you have taken too much of this medicine contact a poison control center or emergency room at once. NOTE: This medicine is only for you. Do not share this medicine with others. What if I miss a dose? Keep appointments for follow-up doses as directed. It is important not to miss your dose. Call your doctor or health care professional if you are unable to keep an appointment. What may interact with this medication? Interactions have not been studied. This list may not describe all possible interactions. Give your health care provider a list of all the medicines, herbs, non-prescription drugs, or dietary supplements you use. Also tell them if you smoke, drink alcohol, or use illegal drugs. Some items may interact with your medicine. What should I watch for while using this medication? Your condition will be monitored carefully while you are receiving this medicine. This medicine can cause serious allergic reactions. To reduce your risk, your health care provider may give you other medicine to take before receiving this one. Be sure to follow the directions from your health care provider. This medicine can affect the results of blood tests to match your blood type. These changes can last for up to 6 months after the final dose. Your healthcare provider will do blood tests to match your blood type before you start treatment. Tell all of your  healthcare providers that you are being treated with this medicine before receiving a blood transfusion. This medicine can affect the results of some tests used to determine treatment response; extra tests may be needed to evaluate response. Do not become pregnant while taking this medicine or for 3 months after stopping it. Women should inform their health care provider if they wish to become pregnant or think they might be pregnant. There is a potential for serious side effects to an unborn child. Talk to your health care provider for more information. Do not breast-feed an infant while taking this medicine. What side effects may I notice from receiving this medication? Side effects that you should report to your care team as soon as possible: Allergic reactions--skin rash, itching or hives, swelling of the face, lips, or tongue Blood clot--chest pain, shortness of breath, pain, swelling or warmth in the leg Blurred vision Fast, irregular heartbeat Infection--fever, chills, cough, sore throat, pain or trouble passing urine Injection reactions--dizziness, fast heartbeat, feeling faint or lightheaded, falls, headache, increase in blood pressure, nausea, vomiting, or wheezing or trouble breathing with loud or whistling sounds Low red blood cell counts--trouble breathing, feeling faint, lightheaded or falls, unusually weak or tired Unusual bleeding or bruising Side effects that usually do not require medical attention (report these to your care team if  they continue or are bothersome): Back pain Constipation Diarrhea Pain, tingling, numbness in the hands or feet Pain, redness, or irritation at site where injected Muscle cramp or pain Swelling of the ankles, feet, hands Tiredness Trouble sleeping This list may not describe all possible side effects. Call your doctor for medical advice about side effects. You may report side effects to FDA at 1-800-FDA-1088. Where should I keep my  medication? This drug is given in a hospital or clinic and will not be stored at home. NOTE: This sheet is a summary. It may not cover all possible information. If you have questions about this medicine, talk to your doctor, pharmacist, or health care provider.  2023 Elsevier/Gold Standard (2021-10-09 00:00:00)

## 2022-06-06 ENCOUNTER — Other Ambulatory Visit: Payer: Self-pay | Admitting: Oncology

## 2022-06-10 ENCOUNTER — Inpatient Hospital Stay: Payer: Medicare Other

## 2022-06-10 ENCOUNTER — Encounter: Payer: Self-pay | Admitting: *Deleted

## 2022-06-10 ENCOUNTER — Inpatient Hospital Stay (HOSPITAL_BASED_OUTPATIENT_CLINIC_OR_DEPARTMENT_OTHER): Payer: Medicare Other | Admitting: Oncology

## 2022-06-10 VITALS — BP 129/76 | HR 67 | Temp 98.2°F | Resp 16 | Wt 194.8 lb

## 2022-06-10 DIAGNOSIS — E8581 Light chain (AL) amyloidosis: Secondary | ICD-10-CM

## 2022-06-10 DIAGNOSIS — Z7901 Long term (current) use of anticoagulants: Secondary | ICD-10-CM | POA: Diagnosis not present

## 2022-06-10 DIAGNOSIS — Z86711 Personal history of pulmonary embolism: Secondary | ICD-10-CM | POA: Diagnosis not present

## 2022-06-10 DIAGNOSIS — Z86718 Personal history of other venous thrombosis and embolism: Secondary | ICD-10-CM | POA: Diagnosis not present

## 2022-06-10 DIAGNOSIS — E859 Amyloidosis, unspecified: Secondary | ICD-10-CM | POA: Diagnosis not present

## 2022-06-10 DIAGNOSIS — I824Z1 Acute embolism and thrombosis of unspecified deep veins of right distal lower extremity: Secondary | ICD-10-CM | POA: Diagnosis not present

## 2022-06-10 LAB — CBC WITH DIFFERENTIAL (CANCER CENTER ONLY)
Abs Immature Granulocytes: 0.03 10*3/uL (ref 0.00–0.07)
Basophils Absolute: 0 10*3/uL (ref 0.0–0.1)
Basophils Relative: 0 %
Eosinophils Absolute: 0.3 10*3/uL (ref 0.0–0.5)
Eosinophils Relative: 3 %
HCT: 38.5 % — ABNORMAL LOW (ref 39.0–52.0)
Hemoglobin: 12.9 g/dL — ABNORMAL LOW (ref 13.0–17.0)
Immature Granulocytes: 0 %
Lymphocytes Relative: 21 %
Lymphs Abs: 1.9 10*3/uL (ref 0.7–4.0)
MCH: 32.9 pg (ref 26.0–34.0)
MCHC: 33.5 g/dL (ref 30.0–36.0)
MCV: 98.2 fL (ref 80.0–100.0)
Monocytes Absolute: 0.8 10*3/uL (ref 0.1–1.0)
Monocytes Relative: 10 %
Neutro Abs: 5.7 10*3/uL (ref 1.7–7.7)
Neutrophils Relative %: 66 %
Platelet Count: 150 10*3/uL (ref 150–400)
RBC: 3.92 MIL/uL — ABNORMAL LOW (ref 4.22–5.81)
RDW: 13.2 % (ref 11.5–15.5)
WBC Count: 8.7 10*3/uL (ref 4.0–10.5)
nRBC: 0 % (ref 0.0–0.2)

## 2022-06-10 MED ORDER — DEXAMETHASONE 4 MG PO TABS
20.0000 mg | ORAL_TABLET | Freq: Once | ORAL | Status: AC
  Administered 2022-06-10: 20 mg via ORAL
  Filled 2022-06-10: qty 5

## 2022-06-10 MED ORDER — DIPHENHYDRAMINE HCL 25 MG PO CAPS
25.0000 mg | ORAL_CAPSULE | Freq: Once | ORAL | Status: AC
  Administered 2022-06-10: 25 mg via ORAL
  Filled 2022-06-10: qty 1

## 2022-06-10 MED ORDER — DARATUMUMAB-HYALURONIDASE-FIHJ 1800-30000 MG-UT/15ML ~~LOC~~ SOLN
1800.0000 mg | Freq: Once | SUBCUTANEOUS | Status: AC
  Administered 2022-06-10: 1800 mg via SUBCUTANEOUS
  Filled 2022-06-10: qty 15

## 2022-06-10 MED ORDER — ACETAMINOPHEN 325 MG PO TABS
650.0000 mg | ORAL_TABLET | Freq: Once | ORAL | Status: AC
  Administered 2022-06-10: 650 mg via ORAL
  Filled 2022-06-10: qty 2

## 2022-06-10 NOTE — Progress Notes (Signed)
[facility] OFFICE PROGRESS NOTE   Diagnosis: Amyloidosis  INTERVAL HISTORY:   Brent Allison returns as scheduled.  He completed another treatment with daratumumab on 05/27/2022.  No rash or dyspnea.  He has somnolence and malaise the day following treatment.  He bruises easily.  Objective:  Vital signs in last 24 hours:  Blood pressure 129/76, pulse 67, temperature 98.2 F (36.8 C), temperature source Oral, resp. rate 16, weight 194 lb 12.8 oz (88.4 kg), SpO2 99 %.    HEENT: No thrush or bleeding Resp: Lungs clear bilaterally Cardio: Regular rate and rhythm GI: No hepatosplenomegaly Vascular: The right lower leg is larger than the left side     Lab Results:  Lab Results  Component Value Date   WBC 8.7 06/10/2022   HGB 12.9 (L) 06/10/2022   HCT 38.5 (L) 06/10/2022   MCV 98.2 06/10/2022   PLT 150 06/10/2022   NEUTROABS 5.7 06/10/2022    CMP  Lab Results  Component Value Date   NA 140 04/16/2022   K 4.3 04/16/2022   CL 106 04/16/2022   CO2 26 04/16/2022   GLUCOSE 103 (H) 04/16/2022   BUN 16 04/16/2022   CREATININE 1.35 (H) 04/16/2022   CALCIUM 9.1 04/16/2022   PROT 5.4 (L) 04/16/2022   ALBUMIN 3.5 04/16/2022   AST 16 04/16/2022   ALT 12 04/16/2022   ALKPHOS 67 04/16/2022   BILITOT 0.6 04/16/2022   GFRNONAA 54 (L) 04/16/2022   GFRAA >60 04/24/2019     Medications: I have reviewed the patient's current medications.   Assessment/Plan:  Amyloid involving an eyelid biopsy 06/10/2011 "Bruising" at the eyelids and mouth: Likely related to amyloidosis, persistent. Numbness and loss of vibratory sense at the fingertips: This predated Velcade-based therapy, but worsened. Now much improved following cervical spine surgery. Elevated serum free lambda light chains. The lambda light chains were lower on November 16 and slightly higher on 12/14/2011. Lambda light chain proteinuria. Bone marrow plasmacytosis: Variable increase in plasma cells noted on the  bone marrow biopsy 07/29/2011 with plasma cells estimated to represent between 4% and 20% of the cellular population. Remote history of prostate cancer. Sleep apnea. Dyslipidemia. Report of pneumonia on 2 occasions in 2011. Admission to a hospital in [place], [place] October 2012 with "pneumonia." A chest x-ray at [facility] on November 2 was negative .   Low serum immunoglobulin G level. Plasma cell dyscrasia with associated amyloidosis.   Initiation of systemic therapy with Cytoxan, Velcade, and Decadron 08/12/2011. Cycle #2 was initiated on 09/16/2011. Cycle #3 was initiated on 10/15/2011. Velcade was placed on hold due to neuropathy. The serum free lambda light chains were decreased on October 08 2011. The serum free lambda light chains were slightly increased on 12/14/2011, lower on 12/29/2011 and 02/02/2012. Initiation of Revlimid/Decadron February 2013, cycle 2 started on 01/28/2012. High-dose melphalan chemotherapy followed by autologous stem cell infusion on 02/09/2013 Initiation of Velcade/Decadrontherapy 05/28/2013, he completed 2 cycles on a day 1, 4, 8, 11 schedule Improvement in the serum free lambda light chains following induction Velcade/Decadron   Initiation of weekly Velcade/Decadron on 07/16/2013, last treatment on 10/16/2013 Improvement in the serum free lambda light chains on 08/21/2013 and 09/18/2013 [facility]) Normal serum free lambda light chains on 12/13/2013 Mild increase of the serum free lambda light chains beginning January 2016-stable  treatment on the Pronto study at [place] beginning 12/17/2015, in  of study 11/23/2016 Started on phase 2b Prothena study-long-term safety/efficacy study 01/11/2017, discontinued May 2018  Doxycycline started  05/17/2017, changed to once daily August 2022 01/25/2022 bone marrow biopsy-hypercellular marrow with plasma cell neoplasm, 7%, plasma cells are predominantly small in size with occasional binucleation noted, no lymphocytosis,  lambda predominant; 13q-/-13 detected, 1p32/1q21 detected, CCND1(BCL1)/IgH t(11;14) detected Daratumumab/Decadron initiated 01/28/2022 Serum free light chains lower 04/02/2022 13.  Soft tissue infection/cellulitis right second finger 03/12/2022-Augmentin 14.  04/16/2022 right peroneal vein DVT at the level of the mid calf, favored acute by radiologist; Eliquis initiated       Disposition: Brent Allison appears unchanged.  He is tolerating the daratumumab/Decadron well.  He will complete another treatment today.  He will continue daratumumab/Decadron on a 2-week schedule.  We will lower the Benadryl dose with treatment today.  He will return for daratumumab/Decadron in 2 weeks.  We will check the serum light chains when he is here in 2 weeks.  He is scheduled to see Dr. Amalia Hailey next month.  Betsy Coder, MD  06/10/2022  9:06 AM

## 2022-06-10 NOTE — Patient Instructions (Signed)
[facility]   Discharge Instructions: Thank you for choosing [facility] to provide your oncology and hematology care.   If you have a lab appointment with the [facility], please go directly to the [facility] and check in at the registration area.   Wear comfortable clothing and clothing appropriate for easy access to any Portacath or PICC line.   We strive to give you quality time with your provider. You may need to reschedule your appointment if you arrive late (15 or more minutes).  Arriving late affects you and other patients whose appointments are after yours.  Also, if you miss three or more appointments without notifying the office, you may be dismissed from the clinic at the provider's discretion.      For prescription refill requests, have your pharmacy contact our office and allow 72 hours for refills to be completed.    Today you received the following chemotherapy and/or immunotherapy agents Darzalex Faspor      To help prevent nausea and vomiting after your treatment, we encourage you to take your nausea medication as directed.  BELOW ARE SYMPTOMS THAT SHOULD BE REPORTED IMMEDIATELY: *FEVER GREATER THAN 100.4 F (38 C) OR HIGHER *CHILLS OR SWEATING *NAUSEA AND VOMITING THAT IS NOT CONTROLLED WITH YOUR NAUSEA MEDICATION *UNUSUAL SHORTNESS OF BREATH *UNUSUAL BRUISING OR BLEEDING *URINARY PROBLEMS (pain or burning when urinating, or frequent urination) *BOWEL PROBLEMS (unusual diarrhea, constipation, pain near the anus) TENDERNESS IN MOUTH AND THROAT WITH OR WITHOUT PRESENCE OF ULCERS (sore throat, sores in mouth, or a toothache) UNUSUAL RASH, SWELLING OR PAIN  UNUSUAL VAGINAL DISCHARGE OR ITCHING   Items with * indicate a potential emergency and should be followed up as soon as possible or go to the Emergency Department if any problems should occur.  Please show the CHEMOTHERAPY ALERT CARD or IMMUNOTHERAPY ALERT CARD at check-in  to the Emergency Department and triage nurse.  Should you have questions after your visit or need to cancel or reschedule your appointment, please contact [facility]  Dept: [phone]  and follow the prompts.  Office hours are 8:00 a.m. to 4:30 p.m. Monday - Friday. Please note that voicemails left after 4:00 p.m. may not be returned until the following business day.  We are closed weekends and major holidays. You have access to a nurse at all times for urgent questions. Please call the main number to the clinic Dept: [phone] and follow the prompts.   For any non-urgent questions, you may also contact your provider using MyChart. We now offer e-Visits for anyone 59 and older to request care online for non-urgent symptoms. For details visit [email or web address].   Also download the MyChart app! Go to the app store, search "MyChart", open the app, select [facility], and log in with your MyChart username and password.  Masks are optional in the cancer centers. If you would like for your care team to wear a mask while they are taking care of you, please let them know. For doctor visits, patients may have with them one support person who is at least 77 years old. At this time, visitors are not allowed in the infusion area.  Daratumumab; Hyaluronidase Injection What is this medication? DARATUMUMAB; HYALURONIDASE (dar a toom ue mab / hye al ur ON i dase) is a monoclonal antibody. Hyaluronidase is used to improve the effects of daratumumab. It treats certain types of cancer. Some of the cancers treated are multiple myeloma  and light-chain amyloidosis. This medicine may be used for other purposes; ask your health care provider or pharmacist if you have questions. COMMON BRAND NAME(S): DARZALEX FASPRO What should I tell my care team before I take this medication? They need to know if you have any of these conditions: heart disease infection especially a viral  infection such as chickenpox, cold sores, herpes, or hepatitis B lung or breathing disease an unusual or allergic reaction to daratumumab, hyaluronidase, other medicines, foods, dyes, or preservatives pregnant or trying to get pregnant breast-feeding How should I use this medication? This medicine is for injection under the skin. It is given by a health care professional in a hospital or clinic setting. Talk to your pediatrician regarding the use of this medicine in children. Special care may be needed. Overdosage: If you think you have taken too much of this medicine contact a poison control center or emergency room at once. NOTE: This medicine is only for you. Do not share this medicine with others. What if I miss a dose? Keep appointments for follow-up doses as directed. It is important not to miss your dose. Call your doctor or health care professional if you are unable to keep an appointment. What may interact with this medication? Interactions have not been studied. This list may not describe all possible interactions. Give your health care provider a list of all the medicines, herbs, non-prescription drugs, or dietary supplements you use. Also tell them if you smoke, drink alcohol, or use illegal drugs. Some items may interact with your medicine. What should I watch for while using this medication? Your condition will be monitored carefully while you are receiving this medicine. This medicine can cause serious allergic reactions. To reduce your risk, your health care provider may give you other medicine to take before receiving this one. Be sure to follow the directions from your health care provider. This medicine can affect the results of blood tests to match your blood type. These changes can last for up to 6 months after the final dose. Your healthcare provider will do blood tests to match your blood type before you start treatment. Tell all of your healthcare providers that you are  being treated with this medicine before receiving a blood transfusion. This medicine can affect the results of some tests used to determine treatment response; extra tests may be needed to evaluate response. Do not become pregnant while taking this medicine or for 3 months after stopping it. Women should inform their health care provider if they wish to become pregnant or think they might be pregnant. There is a potential for serious side effects to an unborn child. Talk to your health care provider for more information. Do not breast-feed an infant while taking this medicine. What side effects may I notice from receiving this medication? Side effects that you should report to your care team as soon as possible: Allergic reactions--skin rash, itching or hives, swelling of the face, lips, or tongue Blood clot--chest pain, shortness of breath, pain, swelling or warmth in the leg Blurred vision Fast, irregular heartbeat Infection--fever, chills, cough, sore throat, pain or trouble passing urine Injection reactions--dizziness, fast heartbeat, feeling faint or lightheaded, falls, headache, increase in blood pressure, nausea, vomiting, or wheezing or trouble breathing with loud or whistling sounds Low red blood cell counts--trouble breathing, feeling faint, lightheaded or falls, unusually weak or tired Unusual bleeding or bruising Side effects that usually do not require medical attention (report these to your care team if they   continue or are bothersome): Back pain Constipation Diarrhea Pain, tingling, numbness in the hands or feet Pain, redness, or irritation at site where injected Muscle cramp or pain Swelling of the ankles, feet, hands Tiredness Trouble sleeping This list may not describe all possible side effects. Call your doctor for medical advice about side effects. You may report side effects to FDA at 1-800-FDA-1088. Where should I keep my medication? This drug is given in a hospital or clinic and  will not be stored at home. NOTE: This sheet is a summary. It may not cover all possible information. If you have questions about this medicine, talk to your doctor, pharmacist, or health care provider.  2023 Elsevier/Gold Standard (2021-10-09 00:00:00)

## 2022-06-10 NOTE — Progress Notes (Signed)
Patient seen by Dr. [name] today  Vitals are within treatment parameters.  Labs reviewed by Dr. [name] and are within treatment parameters. Does not need CMP today  Per physician team, patient is ready for treatment. Please note that modifications are being made to the treatment plan including Reduced dose of Benadryl to 25 mg

## 2022-06-14 ENCOUNTER — Other Ambulatory Visit: Payer: Self-pay

## 2022-06-14 ENCOUNTER — Other Ambulatory Visit: Payer: Self-pay | Admitting: *Deleted

## 2022-06-14 DIAGNOSIS — E8581 Light chain (AL) amyloidosis: Secondary | ICD-10-CM

## 2022-06-20 ENCOUNTER — Other Ambulatory Visit: Payer: Self-pay | Admitting: Oncology

## 2022-06-22 ENCOUNTER — Other Ambulatory Visit: Payer: Self-pay

## 2022-06-24 ENCOUNTER — Inpatient Hospital Stay: Payer: Medicare Other

## 2022-06-24 ENCOUNTER — Inpatient Hospital Stay: Payer: Medicare Other | Attending: Oncology

## 2022-06-24 VITALS — BP 113/67 | HR 79 | Temp 98.5°F | Resp 18 | Ht 72.0 in | Wt 197.2 lb

## 2022-06-24 DIAGNOSIS — E859 Amyloidosis, unspecified: Secondary | ICD-10-CM | POA: Diagnosis not present

## 2022-06-24 DIAGNOSIS — Z86718 Personal history of other venous thrombosis and embolism: Secondary | ICD-10-CM | POA: Diagnosis not present

## 2022-06-24 DIAGNOSIS — E8581 Light chain (AL) amyloidosis: Secondary | ICD-10-CM | POA: Diagnosis not present

## 2022-06-24 DIAGNOSIS — G4730 Sleep apnea, unspecified: Secondary | ICD-10-CM | POA: Insufficient documentation

## 2022-06-24 DIAGNOSIS — Z86711 Personal history of pulmonary embolism: Secondary | ICD-10-CM | POA: Diagnosis not present

## 2022-06-24 DIAGNOSIS — I824Z1 Acute embolism and thrombosis of unspecified deep veins of right distal lower extremity: Secondary | ICD-10-CM | POA: Diagnosis not present

## 2022-06-24 DIAGNOSIS — Z8546 Personal history of malignant neoplasm of prostate: Secondary | ICD-10-CM | POA: Diagnosis not present

## 2022-06-24 DIAGNOSIS — Z7901 Long term (current) use of anticoagulants: Secondary | ICD-10-CM | POA: Diagnosis not present

## 2022-06-24 DIAGNOSIS — E785 Hyperlipidemia, unspecified: Secondary | ICD-10-CM | POA: Diagnosis not present

## 2022-06-24 DIAGNOSIS — Z79899 Other long term (current) drug therapy: Secondary | ICD-10-CM | POA: Insufficient documentation

## 2022-06-24 LAB — CMP (CANCER CENTER ONLY)
ALT: 11 U/L (ref 0–44)
AST: 14 U/L — ABNORMAL LOW (ref 15–41)
Albumin: 3.8 g/dL (ref 3.5–5.0)
Alkaline Phosphatase: 67 U/L (ref 38–126)
Anion gap: 8 (ref 5–15)
BUN: 22 mg/dL (ref 8–23)
CO2: 27 mmol/L (ref 22–32)
Calcium: 9.5 mg/dL (ref 8.9–10.3)
Chloride: 107 mmol/L (ref 98–111)
Creatinine: 1.5 mg/dL — ABNORMAL HIGH (ref 0.61–1.24)
GFR, Estimated: 48 mL/min — ABNORMAL LOW (ref >60)
Glucose, Bld: 88 mg/dL (ref 70–99)
Potassium: 4.2 mmol/L (ref 3.5–5.1)
Sodium: 142 mmol/L (ref 135–145)
Total Bilirubin: 0.6 mg/dL (ref 0.3–1.2)
Total Protein: 5.4 g/dL — ABNORMAL LOW (ref 6.5–8.1)

## 2022-06-24 LAB — CBC WITH DIFFERENTIAL (CANCER CENTER ONLY)
Abs Immature Granulocytes: 0.03 10*3/uL (ref 0.00–0.07)
Basophils Absolute: 0 10*3/uL (ref 0.0–0.1)
Basophils Relative: 0 %
Eosinophils Absolute: 0.2 10*3/uL (ref 0.0–0.5)
Eosinophils Relative: 2 %
HCT: 39.9 % (ref 39.0–52.0)
Hemoglobin: 13.3 g/dL (ref 13.0–17.0)
Immature Granulocytes: 0 %
Lymphocytes Relative: 22 %
Lymphs Abs: 1.9 10*3/uL (ref 0.7–4.0)
MCH: 32.9 pg (ref 26.0–34.0)
MCHC: 33.3 g/dL (ref 30.0–36.0)
MCV: 98.8 fL (ref 80.0–100.0)
Monocytes Absolute: 0.8 10*3/uL (ref 0.1–1.0)
Monocytes Relative: 10 %
Neutro Abs: 5.5 10*3/uL (ref 1.7–7.7)
Neutrophils Relative %: 66 %
Platelet Count: 146 10*3/uL — ABNORMAL LOW (ref 150–400)
RBC: 4.04 MIL/uL — ABNORMAL LOW (ref 4.22–5.81)
RDW: 13.2 % (ref 11.5–15.5)
WBC Count: 8.3 10*3/uL (ref 4.0–10.5)
nRBC: 0 % (ref 0.0–0.2)

## 2022-06-24 MED ORDER — DIPHENHYDRAMINE HCL 25 MG PO CAPS
25.0000 mg | ORAL_CAPSULE | Freq: Once | ORAL | Status: AC
  Administered 2022-06-24: 25 mg via ORAL
  Filled 2022-06-24: qty 1

## 2022-06-24 MED ORDER — DEXAMETHASONE 4 MG PO TABS
20.0000 mg | ORAL_TABLET | Freq: Once | ORAL | Status: AC
  Administered 2022-06-24: 20 mg via ORAL
  Filled 2022-06-24: qty 5

## 2022-06-24 MED ORDER — ACETAMINOPHEN 325 MG PO TABS
650.0000 mg | ORAL_TABLET | Freq: Once | ORAL | Status: AC
  Administered 2022-06-24: 650 mg via ORAL
  Filled 2022-06-24: qty 2

## 2022-06-24 MED ORDER — DARATUMUMAB-HYALURONIDASE-FIHJ 1800-30000 MG-UT/15ML ~~LOC~~ SOLN
1800.0000 mg | Freq: Once | SUBCUTANEOUS | Status: AC
  Administered 2022-06-24: 1800 mg via SUBCUTANEOUS
  Filled 2022-06-24: qty 15

## 2022-06-24 NOTE — Patient Instructions (Signed)
[facility]   Discharge Instructions: Thank you for choosing [facility] to provide your oncology and hematology care.   If you have a lab appointment with the Cancer Center, please go directly to the Cancer Center and check in at the registration area.   Wear comfortable clothing and clothing appropriate for easy access to any Portacath or PICC line.   We strive to give you quality time with your provider. You may need to reschedule your appointment if you arrive late (15 or more minutes).  Arriving late affects you and other patients whose appointments are after yours.  Also, if you miss three or more appointments without notifying the office, you may be dismissed from the clinic at the provider's discretion.      For prescription refill requests, have your pharmacy contact our office and allow 72 hours for refills to be completed.    Today you received the following chemotherapy and/or immunotherapy agents Daratumumab-hyaluronidase-fihj (DARZALEX FASPRO).      To help prevent nausea and vomiting after your treatment, we encourage you to take your nausea medication as directed.  BELOW ARE SYMPTOMS THAT SHOULD BE REPORTED IMMEDIATELY: *FEVER GREATER THAN 100.4 F (38 C) OR HIGHER *CHILLS OR SWEATING *NAUSEA AND VOMITING THAT IS NOT CONTROLLED WITH YOUR NAUSEA MEDICATION *UNUSUAL SHORTNESS OF BREATH *UNUSUAL BRUISING OR BLEEDING *URINARY PROBLEMS (pain or burning when urinating, or frequent urination) *BOWEL PROBLEMS (unusual diarrhea, constipation, pain near the anus) TENDERNESS IN MOUTH AND THROAT WITH OR WITHOUT PRESENCE OF ULCERS (sore throat, sores in mouth, or a toothache) UNUSUAL RASH, SWELLING OR PAIN  UNUSUAL VAGINAL DISCHARGE OR ITCHING   Items with * indicate a potential emergency and should be followed up as soon as possible or go to the Emergency Department if any problems should occur.  Please show the CHEMOTHERAPY ALERT CARD or  IMMUNOTHERAPY ALERT CARD at check-in to the Emergency Department and triage nurse.  Should you have questions after your visit or need to cancel or reschedule your appointment, please contact [facility]  Dept: [phone]  and follow the prompts.  Office hours are 8:00 a.m. to 4:30 p.m. Monday - Friday. Please note that voicemails left after 4:00 p.m. may not be returned until the following business day.  We are closed weekends and major holidays. You have access to a nurse at all times for urgent questions. Please call the main number to the clinic Dept: [phone] and follow the prompts.   For any non-urgent questions, you may also contact your provider using MyChart. We now offer e-Visits for anyone 18 and older to request care online for non-urgent symptoms. For details visit [email or web address].   Also download the MyChart app! Go to the app store, search "MyChart", open the app, select , and log in with your MyChart username and password.  Masks are optional in the cancer centers. If you would like for your care team to wear a mask while they are taking care of you, please let them know. You may have one support person who is at least 77 years old accompany you for your appointments.  Daratumumab; Hyaluronidase Injection What is this medication? DARATUMUMAB; HYALURONIDASE (dar a toom ue mab; hye al ur ON i dase) treats multiple myeloma, a type of bone marrow cancer. Daratumumab works by blocking a protein that causes cancer cells to grow and multiply. This helps to slow or stop the spread of cancer cells. Hyaluronidase works by increasing   the absorption of other medications in the body to help them work better. This medication may also be used treat amyloidosis, a condition that causes the buildup of a protein (amyloid) in your body. It works by reducing the buildup of this protein, which decreases symptoms. It is a combination medication that contains  a monoclonal antibody. This medicine may be used for other purposes; ask your health care provider or pharmacist if you have questions. COMMON BRAND NAME(S): DARZALEX FASPRO What should I tell my care team before I take this medication? They need to know if you have any of these conditions: Heart disease Infection, such as chickenpox, cold sores, herpes, hepatitis B Lung or breathing disease An unusual or allergic reaction to daratumumab, hyaluronidase, other medications, foods, dyes, or preservatives Pregnant or trying to get pregnant Breast-feeding How should I use this medication? This medication is injected under the skin. It is given by your care team in a hospital or clinic setting. Talk to your care team about the use of this medication in children. Special care may be needed. Overdosage: If you think you have taken too much of this medicine contact a poison control center or emergency room at once. NOTE: This medicine is only for you. Do not share this medicine with others. What if I miss a dose? Keep appointments for follow-up doses. It is important not to miss your dose. Call your care team if you are unable to keep an appointment. What may interact with this medication? Interactions have not been studied. This list may not describe all possible interactions. Give your health care provider a list of all the medicines, herbs, non-prescription drugs, or dietary supplements you use. Also tell them if you smoke, drink alcohol, or use illegal drugs. Some items may interact with your medicine. What should I watch for while using this medication? Your condition will be monitored carefully while you are receiving this medication. This medication can cause serious allergic reactions. To reduce your risk, your care team may give you other medication to take before receiving this one. Be sure to follow the directions from your care team. This medication can affect the results of blood tests to  match your blood type. These changes can last for up to 6 months after the final dose. Your care team will do blood tests to match your blood type before you start treatment. Tell all of your care team that you are being treated with this medication before receiving a blood transfusion. This medication can affect the results of some tests used to determine treatment response; extra tests may be needed to evaluate response. Talk to your care team if you wish to become pregnant or think you are pregnant. This medication can cause serious birth defects if taken during pregnancy and for 3 months after the last dose. A reliable form of contraception is recommended while taking this medication and for 3 months after the last dose. Talk to your care team about effective forms of contraception. Do not breast-feed while taking this medication. What side effects may I notice from receiving this medication? Side effects that you should report to your care team as soon as possible: Allergic reactions--skin rash, itching, hives, swelling of the face, lips, tongue, or throat Heart rhythm changes--fast or irregular heartbeat, dizziness, feeling faint or lightheaded, chest pain, trouble breathing Infection--fever, chills, cough, sore throat, wounds that don't heal, pain or trouble when passing urine, general feeling of discomfort or being unwell Infusion reactions--chest pain, shortness of breath   or trouble breathing, feeling faint or lightheaded Sudden eye pain or change in vision such as blurry vision, seeing halos around lights, vision loss Unusual bruising or bleeding Side effects that usually do not require medical attention (report to your care team if they continue or are bothersome): Constipation Diarrhea Fatigue Nausea Pain, tingling, or numbness in the hands or feet Swelling of the ankles, hands, or feet This list may not describe all possible side effects. Call your doctor for medical advice about side  effects. You may report side effects to FDA at 1-800-FDA-1088. Where should I keep my medication? This medication is given in a hospital or clinic. It will not be stored at home. NOTE: This sheet is a summary. It may not cover all possible information. If you have questions about this medicine, talk to your doctor, pharmacist, or health care provider.  2023 Elsevier/Gold Standard (2022-03-03 00:00:00)  

## 2022-06-25 LAB — KAPPA/LAMBDA LIGHT CHAINS
Kappa free light chain: 11.1 mg/L (ref 3.3–19.4)
Kappa, lambda light chain ratio: 0.16 — ABNORMAL LOW (ref 0.26–1.65)
Lambda free light chains: 68.6 mg/L — ABNORMAL HIGH (ref 5.7–26.3)

## 2022-07-03 ENCOUNTER — Other Ambulatory Visit: Payer: Self-pay | Admitting: Oncology

## 2022-07-04 ENCOUNTER — Other Ambulatory Visit: Payer: Self-pay | Admitting: Oncology

## 2022-07-04 NOTE — Progress Notes (Signed)
OFF PATHWAY REGIMEN - Multiple Myeloma and Other Plasma Cell Dyscrasias  No Change  Continue With Treatment as Ordered.  Original Decision Date/Time: 01/12/2022 10:06   [id number]:Daratumumab SUBQ q28 Days:   Cycles 1 and 2: A cycle is every 28 days:     Daratumumab and hyaluronidase-fihj    Cycles 3 through 6: A cycle is every 28 days:     Daratumumab and hyaluronidase-fihj    Cycles 7 and beyond: A cycle is every 28 days:     Daratumumab and hyaluronidase-fihj   **Always confirm dose/schedule in your pharmacy ordering system**  Patient Characteristics: Primary AL Amyloidosis, Second Line and Beyond Disease Classification: Primary AL Amyloidosis Line of therapy: Second Line and Beyond Intent of Therapy: Non-Curative / Palliative Intent, Discussed with Patient

## 2022-07-08 ENCOUNTER — Encounter: Payer: Self-pay | Admitting: Nurse Practitioner

## 2022-07-08 ENCOUNTER — Inpatient Hospital Stay (HOSPITAL_BASED_OUTPATIENT_CLINIC_OR_DEPARTMENT_OTHER): Payer: Medicare Other | Admitting: Nurse Practitioner

## 2022-07-08 ENCOUNTER — Encounter: Payer: Self-pay | Admitting: *Deleted

## 2022-07-08 ENCOUNTER — Inpatient Hospital Stay: Payer: Medicare Other

## 2022-07-08 VITALS — BP 119/61 | HR 75 | Temp 98.2°F | Resp 20 | Ht 72.0 in | Wt 197.2 lb

## 2022-07-08 VITALS — BP 117/65 | HR 66 | Resp 17

## 2022-07-08 DIAGNOSIS — E859 Amyloidosis, unspecified: Secondary | ICD-10-CM | POA: Diagnosis not present

## 2022-07-08 DIAGNOSIS — Z86711 Personal history of pulmonary embolism: Secondary | ICD-10-CM | POA: Diagnosis not present

## 2022-07-08 DIAGNOSIS — Z86718 Personal history of other venous thrombosis and embolism: Secondary | ICD-10-CM | POA: Diagnosis not present

## 2022-07-08 DIAGNOSIS — E8581 Light chain (AL) amyloidosis: Secondary | ICD-10-CM

## 2022-07-08 DIAGNOSIS — Z7901 Long term (current) use of anticoagulants: Secondary | ICD-10-CM | POA: Diagnosis not present

## 2022-07-08 DIAGNOSIS — I824Z1 Acute embolism and thrombosis of unspecified deep veins of right distal lower extremity: Secondary | ICD-10-CM | POA: Diagnosis not present

## 2022-07-08 LAB — CBC WITH DIFFERENTIAL (CANCER CENTER ONLY)
Abs Immature Granulocytes: 0.02 10*3/uL (ref 0.00–0.07)
Basophils Absolute: 0 10*3/uL (ref 0.0–0.1)
Basophils Relative: 0 %
Eosinophils Absolute: 0.1 10*3/uL (ref 0.0–0.5)
Eosinophils Relative: 2 %
HCT: 40.4 % (ref 39.0–52.0)
Hemoglobin: 13.4 g/dL (ref 13.0–17.0)
Immature Granulocytes: 0 %
Lymphocytes Relative: 23 %
Lymphs Abs: 1.9 10*3/uL (ref 0.7–4.0)
MCH: 32.5 pg (ref 26.0–34.0)
MCHC: 33.2 g/dL (ref 30.0–36.0)
MCV: 98.1 fL (ref 80.0–100.0)
Monocytes Absolute: 0.9 10*3/uL (ref 0.1–1.0)
Monocytes Relative: 11 %
Neutro Abs: 5.2 10*3/uL (ref 1.7–7.7)
Neutrophils Relative %: 64 %
Platelet Count: 146 10*3/uL — ABNORMAL LOW (ref 150–400)
RBC: 4.12 MIL/uL — ABNORMAL LOW (ref 4.22–5.81)
RDW: 13.2 % (ref 11.5–15.5)
WBC Count: 8.2 10*3/uL (ref 4.0–10.5)
nRBC: 0 % (ref 0.0–0.2)

## 2022-07-08 MED ORDER — DIPHENHYDRAMINE HCL 25 MG PO CAPS
25.0000 mg | ORAL_CAPSULE | Freq: Once | ORAL | Status: AC
  Administered 2022-07-08: 25 mg via ORAL
  Filled 2022-07-08: qty 1

## 2022-07-08 MED ORDER — DEXAMETHASONE 4 MG PO TABS
20.0000 mg | ORAL_TABLET | Freq: Once | ORAL | Status: AC
  Administered 2022-07-08: 20 mg via ORAL
  Filled 2022-07-08: qty 5

## 2022-07-08 MED ORDER — ACETAMINOPHEN 325 MG PO TABS
650.0000 mg | ORAL_TABLET | Freq: Once | ORAL | Status: AC
  Administered 2022-07-08: 650 mg via ORAL
  Filled 2022-07-08: qty 2

## 2022-07-08 MED ORDER — DARATUMUMAB-HYALURONIDASE-FIHJ 1800-30000 MG-UT/15ML ~~LOC~~ SOLN
1800.0000 mg | Freq: Once | SUBCUTANEOUS | Status: AC
  Administered 2022-07-08: 1800 mg via SUBCUTANEOUS
  Filled 2022-07-08: qty 15

## 2022-07-08 NOTE — Progress Notes (Signed)
Pt declined to stay for post infusion observation period. Pt stated he has tolerated medication multiple times prior without difficulty. Pt aware to call clinic with any questions or concerns. Pt verbalized understanding and had no further questions.   

## 2022-07-08 NOTE — Progress Notes (Signed)
[facility] OFFICE PROGRESS NOTE   Diagnosis: Amyloidosis  INTERVAL HISTORY:   Brent Allison returns as scheduled.  He completed another treatment with daratumumab 06/24/2022.  No rash.  No cough or shortness of breath.  Appetite is good.  No fever.  No nausea or vomiting.  Bowels tend to alternate constipation and diarrhea.  Objective:  Vital signs in last 24 hours:  Blood pressure 119/61, pulse 75, temperature 98.2 F (36.8 C), temperature source Oral, resp. rate 20, height 6' (1.829 m), weight 197 lb 3.2 oz (89.4 kg), SpO2 100 %.    HEENT: No thrush or ulcers. Resp: Lungs clear bilaterally. Cardio: Regular rate and rhythm. GI: No hepatosplenomegaly. Vascular: Bilateral lower leg edema right slightly greater than left.   Lab Results:  Lab Results  Component Value Date   WBC 8.2 07/08/2022   HGB 13.4 07/08/2022   HCT 40.4 07/08/2022   MCV 98.1 07/08/2022   PLT 146 (L) 07/08/2022   NEUTROABS 5.2 07/08/2022    Imaging:  No results found.  Medications: I have reviewed the patient's current medications.  Assessment/Plan: Amyloid involving an eyelid biopsy 06/10/2011 "Bruising" at the eyelids and mouth: Likely related to amyloidosis, persistent. Numbness and loss of vibratory sense at the fingertips: This predated Velcade-based therapy, but worsened. Now much improved following cervical spine surgery. Elevated serum free lambda light chains. The lambda light chains were lower on November 16 and slightly higher on 12/14/2011. Lambda light Allison proteinuria. Bone marrow plasmacytosis: Variable increase in plasma cells noted on the bone marrow biopsy 07/29/2011 with plasma cells estimated to represent between 4% and 20% of the cellular population. Remote history of prostate cancer. Sleep apnea. Dyslipidemia. Report of pneumonia on 2 occasions in 2011. Admission to a hospital in [place], [place] October 2012 with "pneumonia." A chest x-ray at [facility]  on November 2 was negative .   Low serum immunoglobulin G level. Plasma cell dyscrasia with associated amyloidosis.   Initiation of systemic therapy with Cytoxan, Velcade, and Decadron 08/12/2011. Cycle #2 was initiated on 09/16/2011. Cycle #3 was initiated on 10/15/2011. Velcade was placed on hold due to neuropathy. The serum free lambda light chains were decreased on October 08 2011. The serum free lambda light chains were slightly increased on 12/14/2011, lower on 12/29/2011 and 02/02/2012. Initiation of Revlimid/Decadron February 2013, cycle 2 started on 01/28/2012. High-dose melphalan chemotherapy followed by autologous stem cell infusion on 02/09/2013 Initiation of Velcade/Decadrontherapy 05/28/2013, he completed 2 cycles on a day 1, 4, 8, 11 schedule Improvement in the serum free lambda light chains following induction Velcade/Decadron   Initiation of weekly Velcade/Decadron on 07/16/2013, last treatment on 10/16/2013 Improvement in the serum free lambda light chains on 08/21/2013 and 09/18/2013 [facility]) Normal serum free lambda light chains on 12/13/2013 Mild increase of the serum free lambda light chains beginning January 2016-stable  treatment on the Pronto study at [facility] beginning 12/17/2015, in  of study 11/23/2016 Started on phase 2b Prothena study-long-term safety/efficacy study 01/11/2017, discontinued May 2018  Doxycycline started 05/17/2017, changed to once daily August 2022 01/25/2022 bone marrow biopsy-hypercellular marrow with plasma cell neoplasm, 7%, plasma cells are predominantly small in size with occasional binucleation noted, no lymphocytosis, lambda predominant; 13q-/-13 detected, 1p32/1q21 detected, CCND1(BCL1)/IgH t(11;14) detected Daratumumab/Decadron initiated 01/28/2022 Serum free light chains lower 04/02/2022 Serum free light chains stable 06/24/2022 13.  Soft tissue infection/cellulitis right second finger 03/12/2022-Augmentin 14.  04/16/2022 right peroneal vein DVT at the  level of the mid calf, favored acute by radiologist;  Eliquis initiated    Disposition: Brent Allison appears stable.  He continues daratumumab, currently every 2 weeks.  Serum free light chains were stable 06/24/2022.  Plan to proceed with daratumumab today as scheduled.  He will return for daratumumab in 2 weeks.  We will see him in follow-up prior to daratumumab on 08/19/2022.  He will contact the office in the interim with any problems.    Ned Card ANP/GNP-BC   07/08/2022  9:04 AM

## 2022-07-08 NOTE — Patient Instructions (Signed)
[facility]   Discharge Instructions: Thank you for choosing [facility] to provide your oncology and hematology care.   If you have a lab appointment with the Cancer Center, please go directly to the Cancer Center and check in at the registration area.   Wear comfortable clothing and clothing appropriate for easy access to any Portacath or PICC line.   We strive to give you quality time with your provider. You may need to reschedule your appointment if you arrive late (15 or more minutes).  Arriving late affects you and other patients whose appointments are after yours.  Also, if you miss three or more appointments without notifying the office, you may be dismissed from the clinic at the provider's discretion.      For prescription refill requests, have your pharmacy contact our office and allow 72 hours for refills to be completed.    Today you received the following chemotherapy and/or immunotherapy agents Daratumumab-hyaluronidase-fihj (DARZALEX FASPRO).      To help prevent nausea and vomiting after your treatment, we encourage you to take your nausea medication as directed.  BELOW ARE SYMPTOMS THAT SHOULD BE REPORTED IMMEDIATELY: *FEVER GREATER THAN 100.4 F (38 C) OR HIGHER *CHILLS OR SWEATING *NAUSEA AND VOMITING THAT IS NOT CONTROLLED WITH YOUR NAUSEA MEDICATION *UNUSUAL SHORTNESS OF BREATH *UNUSUAL BRUISING OR BLEEDING *URINARY PROBLEMS (pain or burning when urinating, or frequent urination) *BOWEL PROBLEMS (unusual diarrhea, constipation, pain near the anus) TENDERNESS IN MOUTH AND THROAT WITH OR WITHOUT PRESENCE OF ULCERS (sore throat, sores in mouth, or a toothache) UNUSUAL RASH, SWELLING OR PAIN  UNUSUAL VAGINAL DISCHARGE OR ITCHING   Items with * indicate a potential emergency and should be followed up as soon as possible or go to the Emergency Department if any problems should occur.  Please show the CHEMOTHERAPY ALERT CARD or  IMMUNOTHERAPY ALERT CARD at check-in to the Emergency Department and triage nurse.  Should you have questions after your visit or need to cancel or reschedule your appointment, please contact [facility]  Dept: [phone]  and follow the prompts.  Office hours are 8:00 a.m. to 4:30 p.m. Monday - Friday. Please note that voicemails left after 4:00 p.m. may not be returned until the following business day.  We are closed weekends and major holidays. You have access to a nurse at all times for urgent questions. Please call the main number to the clinic Dept: [phone] and follow the prompts.   For any non-urgent questions, you may also contact your provider using MyChart. We now offer e-Visits for anyone 18 and older to request care online for non-urgent symptoms. For details visit [email or web address].   Also download the MyChart app! Go to the app store, search "MyChart", open the app, select [facility], and log in with your MyChart username and password.  Masks are optional in the cancer centers. If you would like for your care team to wear a mask while they are taking care of you, please let them know. You may have one support person who is at least 77 years old accompany you for your appointments.  Daratumumab; Hyaluronidase Injection What is this medication? DARATUMUMAB; HYALURONIDASE (dar a toom ue mab; hye al ur ON i dase) treats multiple myeloma, a type of bone marrow cancer. Daratumumab works by blocking a protein that causes cancer cells to grow and multiply. This helps to slow or stop the spread of cancer cells. Hyaluronidase works by increasing   the absorption of other medications in the body to help them work better. This medication may also be used treat amyloidosis, a condition that causes the buildup of a protein (amyloid) in your body. It works by reducing the buildup of this protein, which decreases symptoms. It is a combination medication that contains  a monoclonal antibody. This medicine may be used for other purposes; ask your health care provider or pharmacist if you have questions. COMMON BRAND NAME(S): DARZALEX FASPRO What should I tell my care team before I take this medication? They need to know if you have any of these conditions: Heart disease Infection, such as chickenpox, cold sores, herpes, hepatitis B Lung or breathing disease An unusual or allergic reaction to daratumumab, hyaluronidase, other medications, foods, dyes, or preservatives Pregnant or trying to get pregnant Breast-feeding How should I use this medication? This medication is injected under the skin. It is given by your care team in a hospital or clinic setting. Talk to your care team about the use of this medication in children. Special care may be needed. Overdosage: If you think you have taken too much of this medicine contact a poison control center or emergency room at once. NOTE: This medicine is only for you. Do not share this medicine with others. What if I miss a dose? Keep appointments for follow-up doses. It is important not to miss your dose. Call your care team if you are unable to keep an appointment. What may interact with this medication? Interactions have not been studied. This list may not describe all possible interactions. Give your health care provider a list of all the medicines, herbs, non-prescription drugs, or dietary supplements you use. Also tell them if you smoke, drink alcohol, or use illegal drugs. Some items may interact with your medicine. What should I watch for while using this medication? Your condition will be monitored carefully while you are receiving this medication. This medication can cause serious allergic reactions. To reduce your risk, your care team may give you other medication to take before receiving this one. Be sure to follow the directions from your care team. This medication can affect the results of blood tests to  match your blood type. These changes can last for up to 6 months after the final dose. Your care team will do blood tests to match your blood type before you start treatment. Tell all of your care team that you are being treated with this medication before receiving a blood transfusion. This medication can affect the results of some tests used to determine treatment response; extra tests may be needed to evaluate response. Talk to your care team if you wish to become pregnant or think you are pregnant. This medication can cause serious birth defects if taken during pregnancy and for 3 months after the last dose. A reliable form of contraception is recommended while taking this medication and for 3 months after the last dose. Talk to your care team about effective forms of contraception. Do not breast-feed while taking this medication. What side effects may I notice from receiving this medication? Side effects that you should report to your care team as soon as possible: Allergic reactions--skin rash, itching, hives, swelling of the face, lips, tongue, or throat Heart rhythm changes--fast or irregular heartbeat, dizziness, feeling faint or lightheaded, chest pain, trouble breathing Infection--fever, chills, cough, sore throat, wounds that don't heal, pain or trouble when passing urine, general feeling of discomfort or being unwell Infusion reactions--chest pain, shortness of breath   trouble breathing, feeling faint or lightheaded Sudden eye pain or change in vision such as blurry vision, seeing halos around lights, vision loss Unusual bruising or bleeding Side effects that usually do not require medical attention (report to your care team if they continue or are bothersome): Constipation Diarrhea Fatigue Nausea Pain, tingling, or numbness in the hands or feet Swelling of the ankles, hands, or feet This list may not describe all possible side effects. Call your doctor for medical advice about side  effects. You may report side effects to FDA at 1-800-FDA-1088. Where should I keep my medication? This medication is given in a hospital or clinic. It will not be stored at home. NOTE: This sheet is a summary. It may not cover all possible information. If you have questions about this medicine, talk to your doctor, pharmacist, or health care provider.  2023 Elsevier/Gold Standard (2022-03-03 00:00:00)

## 2022-07-08 NOTE — Progress Notes (Signed)
Patient seen by Ned Card NP today  Vitals are within treatment parameters.  Labs reviewed by Ned Card NP and are within treatment parameters. CMP not needed  Per physician team, patient is ready for treatment and there are NO modifications to the treatment plan.

## 2022-07-09 DIAGNOSIS — I43 Cardiomyopathy in diseases classified elsewhere: Secondary | ICD-10-CM | POA: Diagnosis not present

## 2022-07-09 DIAGNOSIS — E854 Organ-limited amyloidosis: Secondary | ICD-10-CM | POA: Diagnosis not present

## 2022-07-14 DIAGNOSIS — Z6826 Body mass index (BMI) 26.0-26.9, adult: Secondary | ICD-10-CM | POA: Diagnosis not present

## 2022-07-14 DIAGNOSIS — Z9221 Personal history of antineoplastic chemotherapy: Secondary | ICD-10-CM | POA: Diagnosis not present

## 2022-07-14 DIAGNOSIS — G629 Polyneuropathy, unspecified: Secondary | ICD-10-CM | POA: Diagnosis not present

## 2022-07-14 DIAGNOSIS — R5383 Other fatigue: Secondary | ICD-10-CM | POA: Diagnosis not present

## 2022-07-14 DIAGNOSIS — Z79899 Other long term (current) drug therapy: Secondary | ICD-10-CM | POA: Diagnosis not present

## 2022-07-14 DIAGNOSIS — R0609 Other forms of dyspnea: Secondary | ICD-10-CM | POA: Diagnosis not present

## 2022-07-14 DIAGNOSIS — I5032 Chronic diastolic (congestive) heart failure: Secondary | ICD-10-CM | POA: Diagnosis not present

## 2022-07-14 DIAGNOSIS — Z9484 Stem cells transplant status: Secondary | ICD-10-CM | POA: Diagnosis not present

## 2022-07-14 DIAGNOSIS — T451X5A Adverse effect of antineoplastic and immunosuppressive drugs, initial encounter: Secondary | ICD-10-CM | POA: Diagnosis not present

## 2022-07-14 DIAGNOSIS — D75829 Heparin-induced thrombocytopenia, unspecified: Secondary | ICD-10-CM | POA: Diagnosis not present

## 2022-07-14 DIAGNOSIS — D801 Nonfamilial hypogammaglobulinemia: Secondary | ICD-10-CM | POA: Diagnosis not present

## 2022-07-14 DIAGNOSIS — E8581 Light chain (AL) amyloidosis: Secondary | ICD-10-CM | POA: Diagnosis not present

## 2022-07-14 DIAGNOSIS — I2699 Other pulmonary embolism without acute cor pulmonale: Secondary | ICD-10-CM | POA: Diagnosis not present

## 2022-07-14 DIAGNOSIS — Z9481 Bone marrow transplant status: Secondary | ICD-10-CM | POA: Diagnosis not present

## 2022-07-18 ENCOUNTER — Other Ambulatory Visit: Payer: Self-pay | Admitting: Oncology

## 2022-07-22 ENCOUNTER — Other Ambulatory Visit: Payer: Self-pay | Admitting: *Deleted

## 2022-07-22 ENCOUNTER — Inpatient Hospital Stay: Payer: Medicare Other

## 2022-07-22 VITALS — BP 128/72 | HR 76 | Temp 98.4°F | Resp 18 | Ht 72.0 in | Wt 194.2 lb

## 2022-07-22 DIAGNOSIS — E8581 Light chain (AL) amyloidosis: Secondary | ICD-10-CM

## 2022-07-22 DIAGNOSIS — Z86718 Personal history of other venous thrombosis and embolism: Secondary | ICD-10-CM | POA: Diagnosis not present

## 2022-07-22 DIAGNOSIS — I824Z1 Acute embolism and thrombosis of unspecified deep veins of right distal lower extremity: Secondary | ICD-10-CM | POA: Diagnosis not present

## 2022-07-22 DIAGNOSIS — Z86711 Personal history of pulmonary embolism: Secondary | ICD-10-CM | POA: Diagnosis not present

## 2022-07-22 DIAGNOSIS — E859 Amyloidosis, unspecified: Secondary | ICD-10-CM | POA: Diagnosis not present

## 2022-07-22 DIAGNOSIS — Z7901 Long term (current) use of anticoagulants: Secondary | ICD-10-CM | POA: Diagnosis not present

## 2022-07-22 LAB — CBC WITH DIFFERENTIAL (CANCER CENTER ONLY)
Abs Immature Granulocytes: 0.02 10*3/uL (ref 0.00–0.07)
Basophils Absolute: 0 10*3/uL (ref 0.0–0.1)
Basophils Relative: 0 %
Eosinophils Absolute: 0.1 10*3/uL (ref 0.0–0.5)
Eosinophils Relative: 1 %
HCT: 39.9 % (ref 39.0–52.0)
Hemoglobin: 13.5 g/dL (ref 13.0–17.0)
Immature Granulocytes: 0 %
Lymphocytes Relative: 22 %
Lymphs Abs: 1.9 10*3/uL (ref 0.7–4.0)
MCH: 32.6 pg (ref 26.0–34.0)
MCHC: 33.8 g/dL (ref 30.0–36.0)
MCV: 96.4 fL (ref 80.0–100.0)
Monocytes Absolute: 0.8 10*3/uL (ref 0.1–1.0)
Monocytes Relative: 9 %
Neutro Abs: 5.5 10*3/uL (ref 1.7–7.7)
Neutrophils Relative %: 68 %
Platelet Count: 142 10*3/uL — ABNORMAL LOW (ref 150–400)
RBC: 4.14 MIL/uL — ABNORMAL LOW (ref 4.22–5.81)
RDW: 13.3 % (ref 11.5–15.5)
WBC Count: 8.3 10*3/uL (ref 4.0–10.5)
nRBC: 0 % (ref 0.0–0.2)

## 2022-07-22 MED ORDER — DEXAMETHASONE 4 MG PO TABS
20.0000 mg | ORAL_TABLET | Freq: Once | ORAL | Status: AC
  Administered 2022-07-22: 20 mg via ORAL
  Filled 2022-07-22: qty 5

## 2022-07-22 MED ORDER — DIPHENHYDRAMINE HCL 25 MG PO CAPS
25.0000 mg | ORAL_CAPSULE | Freq: Once | ORAL | Status: AC
  Administered 2022-07-22: 25 mg via ORAL
  Filled 2022-07-22: qty 1

## 2022-07-22 MED ORDER — ACETAMINOPHEN 325 MG PO TABS
650.0000 mg | ORAL_TABLET | Freq: Once | ORAL | Status: AC
  Administered 2022-07-22: 650 mg via ORAL
  Filled 2022-07-22: qty 2

## 2022-07-22 MED ORDER — DARATUMUMAB-HYALURONIDASE-FIHJ 1800-30000 MG-UT/15ML ~~LOC~~ SOLN
1800.0000 mg | Freq: Once | SUBCUTANEOUS | Status: AC
  Administered 2022-07-22: 1800 mg via SUBCUTANEOUS
  Filled 2022-07-22: qty 15

## 2022-07-22 NOTE — Patient Instructions (Addendum)
[facility]   Discharge Instructions: Thank you for choosing [facility] to provide your oncology and hematology care.   If you have a lab appointment with the Cancer Center, please go directly to the Cancer Center and check in at the registration area.   Wear comfortable clothing and clothing appropriate for easy access to any Portacath or PICC line.   We strive to give you quality time with your provider. You may need to reschedule your appointment if you arrive late (15 or more minutes).  Arriving late affects you and other patients whose appointments are after yours.  Also, if you miss three or more appointments without notifying the office, you may be dismissed from the clinic at the provider's discretion.      For prescription refill requests, have your pharmacy contact our office and allow 72 hours for refills to be completed.    Today you received the following chemotherapy and/or immunotherapy agents Darzalex Faspro      To help prevent nausea and vomiting after your treatment, we encourage you to take your nausea medication as directed.  BELOW ARE SYMPTOMS THAT SHOULD BE REPORTED IMMEDIATELY: *FEVER GREATER THAN 100.4 F (38 C) OR HIGHER *CHILLS OR SWEATING *NAUSEA AND VOMITING THAT IS NOT CONTROLLED WITH YOUR NAUSEA MEDICATION *UNUSUAL SHORTNESS OF BREATH *UNUSUAL BRUISING OR BLEEDING *URINARY PROBLEMS (pain or burning when urinating, or frequent urination) *BOWEL PROBLEMS (unusual diarrhea, constipation, pain near the anus) TENDERNESS IN MOUTH AND THROAT WITH OR WITHOUT PRESENCE OF ULCERS (sore throat, sores in mouth, or a toothache) UNUSUAL RASH, SWELLING OR PAIN  UNUSUAL VAGINAL DISCHARGE OR ITCHING   Items with * indicate a potential emergency and should be followed up as soon as possible or go to the Emergency Department if any problems should occur.  Please show the CHEMOTHERAPY ALERT CARD or IMMUNOTHERAPY ALERT CARD at check-in  to the Emergency Department and triage nurse.  Should you have questions after your visit or need to cancel or reschedule your appointment, please contact [facility]  Dept: [phone]  and follow the prompts.  Office hours are 8:00 a.m. to 4:30 p.m. Monday - Friday. Please note that voicemails left after 4:00 p.m. may not be returned until the following business day.  We are closed weekends and major holidays. You have access to a nurse at all times for urgent questions. Please call the main number to the clinic Dept: [phone] and follow the prompts.   For any non-urgent questions, you may also contact your provider using MyChart. We now offer e-Visits for anyone 18 and older to request care online for non-urgent symptoms. For details visit [email or web address].   Also download the MyChart app! Go to the app store, search "MyChart", open the app, select [facility], and log in with your MyChart username and password.  Masks are optional in the cancer centers. If you would like for your care team to wear a mask while they are taking care of you, please let them know. You may have one support person who is at least 77 years old accompany you for your appointments.  Daratumumab; Hyaluronidase Injection What is this medication? DARATUMUMAB; HYALURONIDASE (dar a toom ue mab; hye al ur ON i dase) treats multiple myeloma, a type of bone marrow cancer. Daratumumab works by blocking a protein that causes cancer cells to grow and multiply. This helps to slow or stop the spread of cancer cells. Hyaluronidase works by increasing the   absorption of other medications in the body to help them work better. This medication may also be used treat amyloidosis, a condition that causes the buildup of a protein (amyloid) in your body. It works by reducing the buildup of this protein, which decreases symptoms. It is a combination medication that contains a monoclonal antibody. This  medicine may be used for other purposes; ask your health care provider or pharmacist if you have questions. COMMON BRAND NAME(S): DARZALEX FASPRO What should I tell my care team before I take this medication? They need to know if you have any of these conditions: Heart disease Infection, such as chickenpox, cold sores, herpes, hepatitis B Lung or breathing disease An unusual or allergic reaction to daratumumab, hyaluronidase, other medications, foods, dyes, or preservatives Pregnant or trying to get pregnant Breast-feeding How should I use this medication? This medication is injected under the skin. It is given by your care team in a hospital or clinic setting. Talk to your care team about the use of this medication in children. Special care may be needed. Overdosage: If you think you have taken too much of this medicine contact a poison control center or emergency room at once. NOTE: This medicine is only for you. Do not share this medicine with others. What if I miss a dose? Keep appointments for follow-up doses. It is important not to miss your dose. Call your care team if you are unable to keep an appointment. What may interact with this medication? Interactions have not been studied. This list may not describe all possible interactions. Give your health care provider a list of all the medicines, herbs, non-prescription drugs, or dietary supplements you use. Also tell them if you smoke, drink alcohol, or use illegal drugs. Some items may interact with your medicine. What should I watch for while using this medication? Your condition will be monitored carefully while you are receiving this medication. This medication can cause serious allergic reactions. To reduce your risk, your care team may give you other medication to take before receiving this one. Be sure to follow the directions from your care team. This medication can affect the results of blood tests to match your blood type. These  changes can last for up to 6 months after the final dose. Your care team will do blood tests to match your blood type before you start treatment. Tell all of your care team that you are being treated with this medication before receiving a blood transfusion. This medication can affect the results of some tests used to determine treatment response; extra tests may be needed to evaluate response. Talk to your care team if you wish to become pregnant or think you are pregnant. This medication can cause serious birth defects if taken during pregnancy and for 3 months after the last dose. A reliable form of contraception is recommended while taking this medication and for 3 months after the last dose. Talk to your care team about effective forms of contraception. Do not breast-feed while taking this medication. What side effects may I notice from receiving this medication? Side effects that you should report to your care team as soon as possible: Allergic reactions--skin rash, itching, hives, swelling of the face, lips, tongue, or throat Heart rhythm changes--fast or irregular heartbeat, dizziness, feeling faint or lightheaded, chest pain, trouble breathing Infection--fever, chills, cough, sore throat, wounds that don't heal, pain or trouble when passing urine, general feeling of discomfort or being unwell Infusion reactions--chest pain, shortness of breath or   trouble breathing, feeling faint or lightheaded Sudden eye pain or change in vision such as blurry vision, seeing halos around lights, vision loss Unusual bruising or bleeding Side effects that usually do not require medical attention (report to your care team if they continue or are bothersome): Constipation Diarrhea Fatigue Nausea Pain, tingling, or numbness in the hands or feet Swelling of the ankles, hands, or feet This list may not describe all possible side effects. Call your doctor for medical advice about side effects. You may report side  effects to FDA at 1-800-FDA-1088. Where should I keep my medication? This medication is given in a hospital or clinic. It will not be stored at home. NOTE: This sheet is a summary. It may not cover all possible information. If you have questions about this medicine, talk to your doctor, pharmacist, or health care provider.  2023 Elsevier/Gold Standard (2022-03-03 00:00:00)  

## 2022-08-06 DIAGNOSIS — G63 Polyneuropathy in diseases classified elsewhere: Secondary | ICD-10-CM | POA: Diagnosis not present

## 2022-08-06 DIAGNOSIS — E8581 Light chain (AL) amyloidosis: Secondary | ICD-10-CM | POA: Diagnosis not present

## 2022-08-06 DIAGNOSIS — G4730 Sleep apnea, unspecified: Secondary | ICD-10-CM | POA: Diagnosis not present

## 2022-08-06 DIAGNOSIS — I82409 Acute embolism and thrombosis of unspecified deep veins of unspecified lower extremity: Secondary | ICD-10-CM | POA: Diagnosis not present

## 2022-08-06 DIAGNOSIS — Z6826 Body mass index (BMI) 26.0-26.9, adult: Secondary | ICD-10-CM | POA: Diagnosis not present

## 2022-08-06 DIAGNOSIS — I742 Embolism and thrombosis of arteries of the upper extremities: Secondary | ICD-10-CM | POA: Diagnosis not present

## 2022-08-06 DIAGNOSIS — D75829 Heparin-induced thrombocytopenia, unspecified: Secondary | ICD-10-CM | POA: Diagnosis not present

## 2022-08-06 DIAGNOSIS — I2699 Other pulmonary embolism without acute cor pulmonale: Secondary | ICD-10-CM | POA: Diagnosis not present

## 2022-08-06 DIAGNOSIS — I5032 Chronic diastolic (congestive) heart failure: Secondary | ICD-10-CM | POA: Diagnosis not present

## 2022-08-06 DIAGNOSIS — Z9481 Bone marrow transplant status: Secondary | ICD-10-CM | POA: Diagnosis not present

## 2022-08-15 ENCOUNTER — Other Ambulatory Visit: Payer: Self-pay | Admitting: Oncology

## 2022-08-20 ENCOUNTER — Inpatient Hospital Stay: Payer: Medicare Other

## 2022-08-20 ENCOUNTER — Inpatient Hospital Stay (HOSPITAL_BASED_OUTPATIENT_CLINIC_OR_DEPARTMENT_OTHER): Payer: Medicare Other | Admitting: Oncology

## 2022-08-20 ENCOUNTER — Telehealth: Payer: Self-pay | Admitting: Oncology

## 2022-08-20 ENCOUNTER — Inpatient Hospital Stay: Payer: Medicare Other | Attending: Oncology

## 2022-08-20 ENCOUNTER — Other Ambulatory Visit: Payer: Self-pay | Admitting: *Deleted

## 2022-08-20 DIAGNOSIS — E8581 Light chain (AL) amyloidosis: Secondary | ICD-10-CM

## 2022-08-20 DIAGNOSIS — R5381 Other malaise: Secondary | ICD-10-CM | POA: Insufficient documentation

## 2022-08-20 DIAGNOSIS — Z8546 Personal history of malignant neoplasm of prostate: Secondary | ICD-10-CM | POA: Diagnosis not present

## 2022-08-20 DIAGNOSIS — E785 Hyperlipidemia, unspecified: Secondary | ICD-10-CM | POA: Insufficient documentation

## 2022-08-20 DIAGNOSIS — D72822 Plasmacytosis: Secondary | ICD-10-CM | POA: Insufficient documentation

## 2022-08-20 DIAGNOSIS — I824Z1 Acute embolism and thrombosis of unspecified deep veins of right distal lower extremity: Secondary | ICD-10-CM

## 2022-08-20 DIAGNOSIS — Z23 Encounter for immunization: Secondary | ICD-10-CM | POA: Diagnosis not present

## 2022-08-20 DIAGNOSIS — E859 Amyloidosis, unspecified: Secondary | ICD-10-CM | POA: Diagnosis not present

## 2022-08-20 DIAGNOSIS — R809 Proteinuria, unspecified: Secondary | ICD-10-CM | POA: Insufficient documentation

## 2022-08-20 DIAGNOSIS — G4730 Sleep apnea, unspecified: Secondary | ICD-10-CM | POA: Insufficient documentation

## 2022-08-20 DIAGNOSIS — Z8701 Personal history of pneumonia (recurrent): Secondary | ICD-10-CM | POA: Insufficient documentation

## 2022-08-20 DIAGNOSIS — Z8041 Family history of malignant neoplasm of ovary: Secondary | ICD-10-CM | POA: Diagnosis not present

## 2022-08-20 LAB — CBC WITH DIFFERENTIAL (CANCER CENTER ONLY)
Abs Immature Granulocytes: 0.02 10*3/uL (ref 0.00–0.07)
Basophils Absolute: 0 10*3/uL (ref 0.0–0.1)
Basophils Relative: 0 %
Eosinophils Absolute: 0.1 10*3/uL (ref 0.0–0.5)
Eosinophils Relative: 2 %
HCT: 40.3 % (ref 39.0–52.0)
Hemoglobin: 13.6 g/dL (ref 13.0–17.0)
Immature Granulocytes: 0 %
Lymphocytes Relative: 27 %
Lymphs Abs: 1.9 10*3/uL (ref 0.7–4.0)
MCH: 33.2 pg (ref 26.0–34.0)
MCHC: 33.7 g/dL (ref 30.0–36.0)
MCV: 98.3 fL (ref 80.0–100.0)
Monocytes Absolute: 0.8 10*3/uL (ref 0.1–1.0)
Monocytes Relative: 12 %
Neutro Abs: 4 10*3/uL (ref 1.7–7.7)
Neutrophils Relative %: 59 %
Platelet Count: 153 10*3/uL (ref 150–400)
RBC: 4.1 MIL/uL — ABNORMAL LOW (ref 4.22–5.81)
RDW: 13.2 % (ref 11.5–15.5)
WBC Count: 6.9 10*3/uL (ref 4.0–10.5)
nRBC: 0 % (ref 0.0–0.2)

## 2022-08-20 LAB — CMP (CANCER CENTER ONLY)
ALT: 12 U/L (ref 0–44)
AST: 14 U/L — ABNORMAL LOW (ref 15–41)
Albumin: 4 g/dL (ref 3.5–5.0)
Alkaline Phosphatase: 72 U/L (ref 38–126)
Anion gap: 11 (ref 5–15)
BUN: 24 mg/dL — ABNORMAL HIGH (ref 8–23)
CO2: 25 mmol/L (ref 22–32)
Calcium: 9.3 mg/dL (ref 8.9–10.3)
Chloride: 104 mmol/L (ref 98–111)
Creatinine: 1.61 mg/dL — ABNORMAL HIGH (ref 0.61–1.24)
GFR, Estimated: 44 mL/min — ABNORMAL LOW (ref >60)
Glucose, Bld: 113 mg/dL — ABNORMAL HIGH (ref 70–99)
Potassium: 4.2 mmol/L (ref 3.5–5.1)
Sodium: 140 mmol/L (ref 135–145)
Total Bilirubin: 0.5 mg/dL (ref 0.3–1.2)
Total Protein: 5.8 g/dL — ABNORMAL LOW (ref 6.5–8.1)

## 2022-08-20 MED ORDER — APIXABAN 5 MG PO TABS: 5.0000 mg | ORAL_TABLET | Freq: Two times a day (BID) | ORAL | 5 refills | Status: AC

## 2022-08-20 NOTE — Progress Notes (Signed)
[place] OFFICE PROGRESS NOTE   Diagnosis: Amyloidosis  INTERVAL HISTORY:   Brent Allison returns as scheduled.  He was last treated with daratumumab on 07/22/2022.  No new complaint.  He continues to have malaise.  This limits his ability to perform daily activities.  He has mild exertional dyspnea.  He bruises easily.  No other bleeding. He was seen at [facility] last month.  The Lasix dose was increased by cardiology.  An echocardiogram revealed increased left ventricular wall thickness with an LVEF of greater than 55%.  Grade 2 diastolic dysfunction.  The free lambda light chains were mildly elevated at 7.1 and the serum M spike returned at 0.2. Dr. Amalia Hailey recommends holding daratumumab and reassessing his clinical status.  He recommends beginning venetoclax if he does not feel better while off of daratumumab.  Objective:  Vital signs in last 24 hours:  Blood pressure 112/66, pulse 60, temperature 98.2 F (36.8 C), temperature source Oral, resp. rate 18, height 6' (1.829 m), weight 194 lb (88 kg), SpO2 100 %.    HEENT: Macroglossia Resp: Lungs clear bilaterally Cardio: Distant heart sounds, regular rate and rhythm GI: No hepatosplenomegaly Vascular: No leg edema, the right lower leg is larger than the left side  Skin: Right periorbital ecchymosis, ecchymosis at the right upper lip  Lab Results:  Lab Results  Component Value Date   WBC 6.9 08/20/2022   HGB 13.6 08/20/2022   HCT 40.3 08/20/2022   MCV 98.3 08/20/2022   PLT 153 08/20/2022   NEUTROABS 4.0 08/20/2022    CMP  Lab Results  Component Value Date   NA 142 06/24/2022   K 4.2 06/24/2022   CL 107 06/24/2022   CO2 27 06/24/2022   GLUCOSE 88 06/24/2022   BUN 22 06/24/2022   CREATININE 1.50 (H) 06/24/2022   CALCIUM 9.5 06/24/2022   PROT 5.4 (L) 06/24/2022   ALBUMIN 3.8 06/24/2022   AST 14 (L) 06/24/2022   ALT 11 06/24/2022   ALKPHOS 67 06/24/2022   BILITOT 0.6 06/24/2022   GFRNONAA 48 (L)  06/24/2022   GFRAA >60 04/24/2019    Medications: I have reviewed the patient's current medications.   Assessment/Plan: Amyloid involving an eyelid biopsy 06/10/2011 "Bruising" at the eyelids and mouth: Likely related to amyloidosis, persistent. Numbness and loss of vibratory sense at the fingertips: This predated Velcade-based therapy, but worsened. Now much improved following cervical spine surgery. Elevated serum free lambda light chains. The lambda light chains were lower on November 16 and slightly higher on 12/14/2011. Lambda light chain proteinuria. Bone marrow plasmacytosis: Variable increase in plasma cells noted on the bone marrow biopsy 07/29/2011 with plasma cells estimated to represent between 4% and 20% of the cellular population. Remote history of prostate cancer. Sleep apnea. Dyslipidemia. Report of pneumonia on 2 occasions in 2011. Admission to a hospital in [place], [place] October 2012 with "pneumonia." A chest x-ray at [facility] on November 2 was negative .   Low serum immunoglobulin G level. Plasma cell dyscrasia with associated amyloidosis.   Initiation of systemic therapy with Cytoxan, Velcade, and Decadron 08/12/2011. Cycle #2 was initiated on 09/16/2011. Cycle #3 was initiated on 10/15/2011. Velcade was placed on hold due to neuropathy. The serum free lambda light chains were decreased on October 08 2011. The serum free lambda light chains were slightly increased on 12/14/2011, lower on 12/29/2011 and 02/02/2012. Initiation of Revlimid/Decadron February 2013, cycle 2 started on 01/28/2012. High-dose melphalan chemotherapy followed by autologous stem cell infusion on  02/09/2013 Initiation of Velcade/Decadrontherapy 05/28/2013, he completed 2 cycles on a day 1, 4, 8, 11 schedule Improvement in the serum free lambda light chains following induction Velcade/Decadron   Initiation of weekly Velcade/Decadron on 07/16/2013, last treatment on  10/16/2013 Improvement in the serum free lambda light chains on 08/21/2013 and 09/18/2013 [facility]) Normal serum free lambda light chains on 12/13/2013 Mild increase of the serum free lambda light chains beginning January 2016-stable  treatment on the Pronto study at [facility] beginning 12/17/2015, in  of study 11/23/2016 Started on phase 2b Prothena study-long-term safety/efficacy study 01/11/2017, discontinued May 2018  Doxycycline started 05/17/2017, changed to once daily August 2022 01/25/2022 bone marrow biopsy-hypercellular marrow with plasma cell neoplasm, 7%, plasma cells are predominantly small in size with occasional binucleation noted, no lymphocytosis, lambda predominant; 13q-/-13 detected, 1p32/1q21 detected, CCND1(BCL1)/IgH t(11;14) detected Daratumumab/Decadron initiated 01/28/2022 Serum free light chains lower 04/02/2022 Serum free light chains stable 06/24/2022 Last daratumumab 07/22/2022 13.  Soft tissue infection/cellulitis right second finger 03/12/2022-Augmentin 14.  04/16/2022 right peroneal vein DVT at the level of the mid calf, favored acute by radiologist; Eliquis initiated      Disposition: Brent Allison appears unchanged.  He has completed 6 months of daratumumab therapy.  His clinical status has declined with increased malaise, exertional dyspnea, edema, and bruising.  The serum free light chains remain elevated.  We will repeat serum free light chains today.  The plan is to discontinue daratumumab.  It doubt his malaise is related to daratumumab, but this is possible.  We will reassess his status when he returns next month.  If he has not experienced in clinical improvement while off of daratumumab the plan is to begin a trial of venetoclax.  He plans to obtain influenza, COVID-19, RSV, and pneumococcal 20 vaccines.  Betsy Coder, MD  08/20/2022  9:14 AM

## 2022-09-20 ENCOUNTER — Other Ambulatory Visit (HOSPITAL_BASED_OUTPATIENT_CLINIC_OR_DEPARTMENT_OTHER): Payer: Self-pay

## 2022-09-20 ENCOUNTER — Inpatient Hospital Stay: Payer: Medicare Other

## 2022-09-20 ENCOUNTER — Encounter: Payer: Self-pay | Admitting: Oncology

## 2022-09-20 ENCOUNTER — Inpatient Hospital Stay: Payer: Medicare Other | Attending: Oncology | Admitting: Oncology

## 2022-09-20 DIAGNOSIS — Z8701 Personal history of pneumonia (recurrent): Secondary | ICD-10-CM | POA: Insufficient documentation

## 2022-09-20 DIAGNOSIS — D72822 Plasmacytosis: Secondary | ICD-10-CM | POA: Insufficient documentation

## 2022-09-20 DIAGNOSIS — R809 Proteinuria, unspecified: Secondary | ICD-10-CM | POA: Diagnosis not present

## 2022-09-20 DIAGNOSIS — Z23 Encounter for immunization: Secondary | ICD-10-CM | POA: Diagnosis not present

## 2022-09-20 DIAGNOSIS — E8581 Light chain (AL) amyloidosis: Secondary | ICD-10-CM

## 2022-09-20 DIAGNOSIS — G4730 Sleep apnea, unspecified: Secondary | ICD-10-CM | POA: Diagnosis not present

## 2022-09-20 DIAGNOSIS — Z8546 Personal history of malignant neoplasm of prostate: Secondary | ICD-10-CM | POA: Diagnosis not present

## 2022-09-20 DIAGNOSIS — Z8041 Family history of malignant neoplasm of ovary: Secondary | ICD-10-CM | POA: Diagnosis not present

## 2022-09-20 DIAGNOSIS — E859 Amyloidosis, unspecified: Secondary | ICD-10-CM | POA: Diagnosis not present

## 2022-09-20 LAB — CMP (CANCER CENTER ONLY)
ALT: 10 U/L (ref 0–44)
AST: 13 U/L — ABNORMAL LOW (ref 15–41)
Albumin: 4 g/dL (ref 3.5–5.0)
Alkaline Phosphatase: 76 U/L (ref 38–126)
Anion gap: 11 (ref 5–15)
BUN: 24 mg/dL — ABNORMAL HIGH (ref 8–23)
CO2: 26 mmol/L (ref 22–32)
Calcium: 9.6 mg/dL (ref 8.9–10.3)
Chloride: 102 mmol/L (ref 98–111)
Creatinine: 1.55 mg/dL — ABNORMAL HIGH (ref 0.61–1.24)
GFR, Estimated: 46 mL/min — ABNORMAL LOW (ref >60)
Glucose, Bld: 117 mg/dL — ABNORMAL HIGH (ref 70–99)
Potassium: 4 mmol/L (ref 3.5–5.1)
Sodium: 139 mmol/L (ref 135–145)
Total Bilirubin: 0.6 mg/dL (ref 0.3–1.2)
Total Protein: 6 g/dL — ABNORMAL LOW (ref 6.5–8.1)

## 2022-09-20 MED ORDER — COMIRNATY 30 MCG/0.3ML IM SUSY
PREFILLED_SYRINGE | INTRAMUSCULAR | 0 refills | Status: DC
  Filled 2022-09-20: qty 0.3, 1d supply, fill #0

## 2022-09-20 NOTE — Progress Notes (Signed)
[place] OFFICE PROGRESS NOTE   Diagnosis: Amyloidosis  INTERVAL HISTORY:   Brent Allison returns as scheduled.  He was last treated with daratumumab on 07/22/2022.  He reports partial improvement in his energy level.  New complaint.  He saw cardiology on 08/06/2022.  The Lasix dose was increased.  Objective:  Vital signs in last 24 hours:  There were no vitals taken for this visit.    HEENT: Macroglossia Resp: Lungs clear bilaterally Cardio: Regular rhythm, distant heart sounds GI: No hepatosplenomegaly Vascular: No leg edema   Lab Results:  Lab Results  Component Value Date   WBC 6.9 08/20/2022   HGB 13.6 08/20/2022   HCT 40.3 08/20/2022   MCV 98.3 08/20/2022   PLT 153 08/20/2022   NEUTROABS 4.0 08/20/2022    CMP  Lab Results  Component Value Date   NA 139 09/20/2022   K 4.0 09/20/2022   CL 102 09/20/2022   CO2 26 09/20/2022   GLUCOSE 117 (H) 09/20/2022   BUN 24 (H) 09/20/2022   CREATININE 1.55 (H) 09/20/2022   CALCIUM 9.6 09/20/2022   PROT 6.0 (L) 09/20/2022   ALBUMIN 4.0 09/20/2022   AST 13 (L) 09/20/2022   ALT 10 09/20/2022   ALKPHOS 76 09/20/2022   BILITOT 0.6 09/20/2022   GFRNONAA 46 (L) 09/20/2022   GFRAA >60 04/24/2019    Medications: I have reviewed the patient's current medications.   Assessment/Plan: Amyloid involving an eyelid biopsy 06/10/2011 "Bruising" at the eyelids and mouth: Likely related to amyloidosis, persistent. Numbness and loss of vibratory sense at the fingertips: This predated Velcade-based therapy, but worsened. Now much improved following cervical spine surgery. Elevated serum free lambda light chains. The lambda light chains were lower on November 16 and slightly higher on 12/14/2011. Lambda light chain proteinuria. Bone marrow plasmacytosis: Variable increase in plasma cells noted on the bone marrow biopsy 07/29/2011 with plasma cells estimated to represent between 4% and 20% of the cellular  population. Remote history of prostate cancer. Sleep apnea. Dyslipidemia. Report of pneumonia on 2 occasions in 2011. Admission to a hospital in [place], [place] October 2012 with "pneumonia." A chest x-ray at [facility] on November 2 was negative .   Low serum immunoglobulin G level. Plasma cell dyscrasia with associated amyloidosis.   Initiation of systemic therapy with Cytoxan, Velcade, and Decadron 08/12/2011. Cycle #2 was initiated on 09/16/2011. Cycle #3 was initiated on 10/15/2011. Velcade was placed on hold due to neuropathy. The serum free lambda light chains were decreased on October 08 2011. The serum free lambda light chains were slightly increased on 12/14/2011, lower on 12/29/2011 and 02/02/2012. Initiation of Revlimid/Decadron February 2013, cycle 2 started on 01/28/2012. High-dose melphalan chemotherapy followed by autologous stem cell infusion on 02/09/2013 Initiation of Velcade/Decadrontherapy 05/28/2013, he completed 2 cycles on a day 1, 4, 8, 11 schedule Improvement in the serum free lambda light chains following induction Velcade/Decadron   Initiation of weekly Velcade/Decadron on 07/16/2013, last treatment on 10/16/2013 Improvement in the serum free lambda light chains on 08/21/2013 and 09/18/2013 [facility]) Normal serum free lambda light chains on 12/13/2013 Mild increase of the serum free lambda light chains beginning January 2016-stable  treatment on the Pronto study at [place] beginning 12/17/2015, in  of study 11/23/2016 Started on phase 2b Prothena study-long-term safety/efficacy study 01/11/2017, discontinued May 2018  Doxycycline started 05/17/2017, changed to once daily August 2022 01/25/2022 bone marrow biopsy-hypercellular marrow with plasma cell neoplasm, 7%, plasma cells are predominantly small in size with occasional  binucleation noted, no lymphocytosis, lambda predominant; 13q-/-13 detected, 1p32/1q21 detected, CCND1(BCL1)/IgH t(11;14)  detected Daratumumab/Decadron initiated 01/28/2022 Serum free light chains lower 04/02/2022 Serum free light chains stable 06/24/2022 Last daratumumab 07/22/2022 13.  Soft tissue infection/cellulitis right second finger 03/12/2022-Augmentin 14.  04/16/2022 right peroneal vein DVT at the level of the mid calf, favored acute by radiologist; Eliquis initiated       Disposition: Brent Allison appears unchanged.  Reports partial improvement in his energy level.  We will follow-up on the serum light chains from today.  I discussed resuming daratumumab, continued observation, and venetoclax therapy with Brent Allison.  We will consider doing daratumumab versus venetoclax if the light chains are higher today.  He does not wish to begin a new treatment until after his wife and daughter are settled in a memory care unit.  He will return for an office visit in 4 weeks.  Betsy Coder, MD  09/20/2022  9:50 AM

## 2022-09-21 LAB — KAPPA/LAMBDA LIGHT CHAINS
Kappa free light chain: 15 mg/L (ref 3.3–19.4)
Kappa, lambda light chain ratio: 0.21 — ABNORMAL LOW (ref 0.26–1.65)
Lambda free light chains: 71.8 mg/L — ABNORMAL HIGH (ref 5.7–26.3)

## 2022-09-22 ENCOUNTER — Telehealth: Payer: Self-pay | Admitting: *Deleted

## 2022-09-22 NOTE — Telephone Encounter (Signed)
-----   Message from Ladell Pier, MD sent at 09/22/2022  2:45 PM EDT ----- Please call patient, light chains are stable, return as scheduled.  We will discuss venetoclax therapy at the next visit

## 2022-09-22 NOTE — Telephone Encounter (Signed)
Called Brent Allison that light chains are stable and to f/u as scheduled. Will discuss venetoclax therapy at next visit.

## 2022-09-24 ENCOUNTER — Telehealth: Payer: Self-pay | Admitting: *Deleted

## 2022-09-24 NOTE — Telephone Encounter (Signed)
Called to report he tested positive for COVID today. Is symptomatic and he called his PCP and has been started on paxovid. Thinks the vaccine he received last week caused it. Assured him that it did not.

## 2022-09-29 DIAGNOSIS — H0100B Unspecified blepharitis left eye, upper and lower eyelids: Secondary | ICD-10-CM | POA: Diagnosis not present

## 2022-09-29 DIAGNOSIS — H0100A Unspecified blepharitis right eye, upper and lower eyelids: Secondary | ICD-10-CM | POA: Diagnosis not present

## 2022-10-04 DIAGNOSIS — D692 Other nonthrombocytopenic purpura: Secondary | ICD-10-CM | POA: Diagnosis not present

## 2022-10-04 DIAGNOSIS — D0439 Carcinoma in situ of skin of other parts of face: Secondary | ICD-10-CM | POA: Diagnosis not present

## 2022-10-04 DIAGNOSIS — D225 Melanocytic nevi of trunk: Secondary | ICD-10-CM | POA: Diagnosis not present

## 2022-10-04 DIAGNOSIS — L309 Dermatitis, unspecified: Secondary | ICD-10-CM | POA: Diagnosis not present

## 2022-10-04 DIAGNOSIS — C4442 Squamous cell carcinoma of skin of scalp and neck: Secondary | ICD-10-CM | POA: Diagnosis not present

## 2022-10-04 DIAGNOSIS — D485 Neoplasm of uncertain behavior of skin: Secondary | ICD-10-CM | POA: Diagnosis not present

## 2022-10-04 DIAGNOSIS — Z85828 Personal history of other malignant neoplasm of skin: Secondary | ICD-10-CM | POA: Diagnosis not present

## 2022-10-04 DIAGNOSIS — L821 Other seborrheic keratosis: Secondary | ICD-10-CM | POA: Diagnosis not present

## 2022-10-04 DIAGNOSIS — C44319 Basal cell carcinoma of skin of other parts of face: Secondary | ICD-10-CM | POA: Diagnosis not present

## 2022-10-05 ENCOUNTER — Telehealth: Payer: Self-pay | Admitting: Acute Care

## 2022-10-05 ENCOUNTER — Ambulatory Visit (INDEPENDENT_AMBULATORY_CARE_PROVIDER_SITE_OTHER): Payer: Medicare Other | Admitting: Acute Care

## 2022-10-05 ENCOUNTER — Encounter: Payer: Self-pay | Admitting: Acute Care

## 2022-10-05 ENCOUNTER — Ambulatory Visit (INDEPENDENT_AMBULATORY_CARE_PROVIDER_SITE_OTHER): Payer: Medicare Other

## 2022-10-05 VITALS — BP 112/68 | HR 75 | Temp 97.9°F | Ht 72.0 in | Wt 191.2 lb

## 2022-10-05 DIAGNOSIS — R0989 Other specified symptoms and signs involving the circulatory and respiratory systems: Secondary | ICD-10-CM | POA: Diagnosis not present

## 2022-10-05 DIAGNOSIS — J019 Acute sinusitis, unspecified: Secondary | ICD-10-CM

## 2022-10-05 DIAGNOSIS — G4733 Obstructive sleep apnea (adult) (pediatric): Secondary | ICD-10-CM | POA: Diagnosis not present

## 2022-10-05 DIAGNOSIS — J449 Chronic obstructive pulmonary disease, unspecified: Secondary | ICD-10-CM | POA: Diagnosis not present

## 2022-10-05 MED ORDER — AMOXICILLIN-POT CLAVULANATE 875-125 MG PO TABS: 1.0000 | ORAL_TABLET | Freq: Two times a day (BID) | ORAL | 0 refills | Status: DC

## 2022-10-05 NOTE — Patient Instructions (Addendum)
It is good to see you today. We will send in Augmentin 875/125 take 1 tablet twice daily. Please take Probiotic with antibiotic ( Activia Yogurt. Daily)  CXR today>> stat . I will call you with results. Call if your drainage does not clear.  Consider adding Claritin for the post nasal drip.  Call to be seen if you are no better , bleeding does not clear.  Follow up with Dr. Annamaria Boots as is scheduled in January 2024. Marland Kitchen  Please contact office for sooner follow up if symptoms do not improve or worsen or seek emergency care

## 2022-10-05 NOTE — Telephone Encounter (Signed)
I have called the patient with the results of his CXR. CXR was clear with scarring. He verbalized understanding and had no further questions at completion of the call.

## 2022-10-05 NOTE — Progress Notes (Signed)
History of Present Illness Brent Allison is a 76 y.o. male with complex medical history, history of amyloidosis , OSA and multiple myeloma, DVT. He is followed by Dr. Valeta Harms for history of hemoptysis and  and Dr, Annamaria Boots for OSA and amyloidosis.  Synopsis  This is a 76 year old gentleman followed by Dr. [name].  Diagnosed in 2012 via eyelid biopsy, amyloidosis.  Found to have elevated serum free lambda light chain and lambda light chain proteinuria.  Bone marrow biopsy with plasmacytosis.  Plasma cell dyscrasia with associated amyloid was treated with Cytoxan, Velcade and Decadron in 2012.  2013 treated with Revlimid plus Decadron.  2014 high-dose melphalan followed by autologous stem cell infusion.  Again in 2014 receiving Velcade plus Decadron.  Continued this treatment for some time. Patient was started in the [place] study at [facility] beginning in January 2017.  Then started in the ProFeno study which was discontinued in May 2018.  Pulmonary nodules were noted on CT imaging ( 11 mm subpleural nodule RUL), with documented stability. Return to clinic for hemoptysis.    10/05/2022 Pt. Presents today for an acute visit. He states he developed a sinus drainage that has some blood in it .He does have a history of hemoptysis in the past. He is on Eliquis 5 mg BID. He does not feel this is from his chest, but from drainage from his sinuses.  He states he has some post nasal drip. Secretions that are draining down the back of his throat are yellow in color. He has had this sinus drainage for quite some time.He does not take daily non-sedating antihistamine.   No fever. He wants to be very careful as he has a history of hemoptysis.   Test Results: CXR 10/05/2022 Pending      Latest Ref Rng & Units 08/20/2022    8:29 AM 07/22/2022    8:35 AM 07/08/2022    8:24 AM  CBC  WBC 4.0 - 10.5 K/uL 6.9  8.3  8.2   Hemoglobin 13.0 - 17.0 g/dL 13.6  13.5  13.4   Hematocrit 39.0 - 52.0 % 40.3  39.9  40.4    Platelets 150 - 400 K/uL 153  142  146        Latest Ref Rng & Units 09/20/2022    8:46 AM 08/20/2022    8:29 AM 06/24/2022    8:37 AM  BMP  Glucose 70 - 99 mg/dL 117  113  88   BUN 8 - 23 mg/dL _0 Creatinine 0.61 - 1.24 mg/dL 1.55  1.61  1.50   Sodium 135 - 145 mmol/L 139  140  142   Potassium 3.5 - 5.1 mmol/L 4.0  4.2  4.2   Chloride 98 - 111 mmol/L 102  104  107   CO2 22 - 32 mmol/L _1 Calcium 8.9 - 10.3 mg/dL 9.6  9.3  9.5     BNP    Component Value Date/Time   BNP 250.7 (H) 03/15/2013 1202    ProBNP No results found for: "PROBNP"  PFT No results found for: "FEV1PRE", "FEV1POST", "FVCPRE", "FVCPOST", "TLC", "DLCOUNC", "PREFEV1FVCRT", "PSTFEV1FVCRT"  No results found.   Past medical hx Past Medical History:  Diagnosis Date   AL amyloidosis ([place]) 11/04/2012   Arthritis    lumbar DDD   Blood dyscrasia    plasma cell dyscrasia with associated amyloidosis   CKD (chronic kidney disease)    DVT (  deep venous thrombosis) ([place])    right   Dyslipidemia    H/O multiple myeloma    Heparin induced thrombocytopenia (HIT) ([place]) 11/04/2012   Joint pain    Peripheral vascular disease ([place])    DVT- 01/2012, follows by Dr. [name], lovenox maintained since Spring 2013 , pt. aware that last dose is sch. for 07/22/2012   Pneumonia    hx of with last time in Nov 2012   Prostate cancer [facility])    amyloidosis, multiple myeloma    Sleep apnea    severe with AHI 44.4 events per hour - now on CPAP at 11cm H2O   Spastic quadriparesis ([facility])      Social History   Tobacco Use   Smoking status: Former    Packs/day: 0.50    Years: 10.00    Total pack years: 5.00    Types: Cigarettes    Quit date: 07/21/1979    Years since quitting: 43.2   Smokeless tobacco: Never   Tobacco comments:    quit 25+yrs ago  Vaping Use   Vaping Use: Never used  Substance Use Topics   Alcohol use: Yes    Alcohol/week: 2.0 standard drinks of alcohol    Types: 2 Cans of beer per  week    Comment: occasionally   Drug use: No    [name] reports that he quit smoking about 43 years ago. His smoking use included cigarettes. He has a 5.00 pack-year smoking history. He has never used smokeless tobacco. He reports current alcohol use of about 2.0 standard drinks of alcohol per week. He reports that he does not use drugs.  Tobacco Cessation: Former smoker quit 1980 with a 5 pack year smoking history  Past surgical hx, Family hx, Social hx all reviewed.  Current Outpatient Medications on File Prior to Visit  Medication Sig   apixaban (ELIQUIS) 5 MG TABS tablet Take 1 tablet (5 mg total) by mouth 2 (two) times daily.   COVID-19 mRNA vaccine 2023-2024 (COMIRNATY) syringe Inject into the muscle.   Cyanocobalamin (VITAMIN B 12 PO) Take 1,000 mg by mouth daily.   diclofenac (VOLTAREN) 50 MG EC tablet 1 tablet as needed (Patient not taking: Reported on 02/26/2022)   furosemide (LASIX) 20 MG tablet Take 40 mg by mouth every other day. Extra 20 mg on Saturday   OVER THE COUNTER MEDICATION Take 1 tablet by mouth at bedtime as needed (As needed for insomnia).   rosuvastatin (CRESTOR) 10 MG tablet Take 10 mg by mouth daily.   spironolactone (ALDACTONE) 25 MG tablet Take 25 mg by mouth daily.   No current facility-administered medications on file prior to visit.     Allergies  Allergen Reactions   Enoxaparin Other (See Comments)    Blood clots HIT [SRA +]   Heparin Other (See Comments)    Heparin induced thrombocytopenia (SRA +)    Review Of Systems:  Constitutional:   No  weight loss, night sweats,  Fevers, chills, fatigue, or  lassitude.  HEENT:   No headaches,  Difficulty swallowing,  Tooth/dental problems, or  Sore throat,                No sneezing, itching, ear ache, nasal congestion,+ post nasal drip, + bloody nasal drainage  CV:  No chest pain,  Orthopnea, PND, swelling in lower extremities, anasarca, dizziness, palpitations, syncope.   GI  No heartburn,  indigestion, abdominal pain, nausea, vomiting, diarrhea, change in bowel habits, loss of appetite, bloody stools.  Resp: No shortness of breath with exertion or at rest.  No excess mucus, no productive cough,  No non-productive cough,  No coughing up of blood.  No change in color of mucus.  No wheezing.  No chest wall deformity  Skin: several band aids to recent mold removals.  GU: no dysuria, change in color of urine, no urgency or frequency.  No flank pain, no hematuria   MS:  No joint pain or swelling.  No decreased range of motion.  No back pain.  Psych:  No change in mood or affect. No depression or anxiety.  No memory loss.   Vital Signs BP 112/68   Pulse 75   Temp 97.9 F (36.6 C) (Oral)   Ht 6' (1.829 m)   Wt 191 lb 3.2 oz (86.7 kg)   SpO2 99%   BMI 25.93 kg/m    Physical Exam:  General- No distress,  A&Ox3, pleasant ENT: No sinus tenderness, TM clear, pale nasal mucosa, no oral exudate,no post nasal drip, no LAN Cardiac: S1, S2, regular rate and rhythm, no murmur Chest: No wheeze/ rales/ dullness; no accessory muscle use, no nasal flaring, no sternal retractions, some rhonchi on the left, Few rhonchi per left base Abd.: Soft Non-tender, ND, BS +, Body mass index is 25.93 kg/m.  Ext: No clubbing cyanosis, edema Neuro:  normal strength, MAE x 4, A&O x 3 Skin: No rashes, warm and dry, Band aids to several sites where he has had recent mole removal Psych: normal mood and behavior   Assessment/Plan Sinusitis with bloody , yellow nasal drainage Plan We will send in Augmentin 875/125 take 1 tablet twice daily. Please take Probiotic with antibiotic ( Activia Yogurt. Daily)  CXR today>> stat . I will call you with results. Call if your drainage does not clear.  Consider adding Claritin for the post nasal drip.  Call to be seen if you are no better , bleeding does not clear.  Follow up with Dr. Annamaria Boots as is scheduled in January 2024. Marland Kitchen  Please contact office for  sooner follow up if symptoms do not improve or worsen or seek emergency care    I spent 40 minutes dedicated to the care of this patient on the date of this encounter to include pre-visit review of records, face-to-face time with the patient discussing conditions above, post visit ordering of testing, clinical documentation with the electronic health record, making appropriate referrals as documented, and communicating necessary information to the patient's healthcare team.      Magdalen Spatz, NP 10/05/2022  9:02 AM

## 2022-10-12 DIAGNOSIS — M4712 Other spondylosis with myelopathy, cervical region: Secondary | ICD-10-CM | POA: Diagnosis not present

## 2022-10-21 ENCOUNTER — Inpatient Hospital Stay (HOSPITAL_BASED_OUTPATIENT_CLINIC_OR_DEPARTMENT_OTHER): Payer: Medicare Other | Admitting: Oncology

## 2022-10-21 ENCOUNTER — Inpatient Hospital Stay: Payer: Medicare Other | Attending: Oncology

## 2022-10-21 VITALS — BP 123/60 | HR 79 | Temp 98.1°F | Resp 18 | Ht 72.0 in | Wt 195.4 lb

## 2022-10-21 DIAGNOSIS — E8581 Light chain (AL) amyloidosis: Secondary | ICD-10-CM

## 2022-10-21 DIAGNOSIS — Z8546 Personal history of malignant neoplasm of prostate: Secondary | ICD-10-CM | POA: Diagnosis not present

## 2022-10-21 DIAGNOSIS — D72822 Plasmacytosis: Secondary | ICD-10-CM | POA: Insufficient documentation

## 2022-10-21 DIAGNOSIS — R809 Proteinuria, unspecified: Secondary | ICD-10-CM | POA: Insufficient documentation

## 2022-10-21 DIAGNOSIS — G4730 Sleep apnea, unspecified: Secondary | ICD-10-CM | POA: Insufficient documentation

## 2022-10-21 DIAGNOSIS — Z8041 Family history of malignant neoplasm of ovary: Secondary | ICD-10-CM | POA: Diagnosis not present

## 2022-10-21 DIAGNOSIS — Z8701 Personal history of pneumonia (recurrent): Secondary | ICD-10-CM | POA: Diagnosis not present

## 2022-10-21 DIAGNOSIS — E859 Amyloidosis, unspecified: Secondary | ICD-10-CM | POA: Diagnosis not present

## 2022-10-21 LAB — CMP (CANCER CENTER ONLY)
ALT: 9 U/L (ref 0–44)
AST: 13 U/L — ABNORMAL LOW (ref 15–41)
Albumin: 3.9 g/dL (ref 3.5–5.0)
Alkaline Phosphatase: 62 U/L (ref 38–126)
Anion gap: 8 (ref 5–15)
BUN: 27 mg/dL — ABNORMAL HIGH (ref 8–23)
CO2: 26 mmol/L (ref 22–32)
Calcium: 8.9 mg/dL (ref 8.9–10.3)
Chloride: 104 mmol/L (ref 98–111)
Creatinine: 1.54 mg/dL — ABNORMAL HIGH (ref 0.61–1.24)
GFR, Estimated: 46 mL/min — ABNORMAL LOW (ref >60)
Glucose, Bld: 103 mg/dL — ABNORMAL HIGH (ref 70–99)
Potassium: 4 mmol/L (ref 3.5–5.1)
Sodium: 138 mmol/L (ref 135–145)
Total Bilirubin: 0.4 mg/dL (ref 0.3–1.2)
Total Protein: 5.8 g/dL — ABNORMAL LOW (ref 6.5–8.1)

## 2022-10-21 LAB — CBC WITH DIFFERENTIAL (CANCER CENTER ONLY)
Abs Immature Granulocytes: 0.02 10*3/uL (ref 0.00–0.07)
Basophils Absolute: 0 10*3/uL (ref 0.0–0.1)
Basophils Relative: 0 %
Eosinophils Absolute: 0.1 10*3/uL (ref 0.0–0.5)
Eosinophils Relative: 1 %
HCT: 38.1 % — ABNORMAL LOW (ref 39.0–52.0)
Hemoglobin: 12.7 g/dL — ABNORMAL LOW (ref 13.0–17.0)
Immature Granulocytes: 0 %
Lymphocytes Relative: 24 %
Lymphs Abs: 1.9 10*3/uL (ref 0.7–4.0)
MCH: 32.5 pg (ref 26.0–34.0)
MCHC: 33.3 g/dL (ref 30.0–36.0)
MCV: 97.4 fL (ref 80.0–100.0)
Monocytes Absolute: 0.9 10*3/uL (ref 0.1–1.0)
Monocytes Relative: 11 %
Neutro Abs: 5.1 10*3/uL (ref 1.7–7.7)
Neutrophils Relative %: 64 %
Platelet Count: 171 10*3/uL (ref 150–400)
RBC: 3.91 MIL/uL — ABNORMAL LOW (ref 4.22–5.81)
RDW: 13.2 % (ref 11.5–15.5)
WBC Count: 8.1 10*3/uL (ref 4.0–10.5)
nRBC: 0 % (ref 0.0–0.2)

## 2022-10-21 NOTE — Patient Instructions (Signed)
Venetoclax Tablets What is this medication? VENETOCLAX (ven ET oh klax) treats leukemia and lymphoma. It works by blocking a protein that causes cancer cells to grow and multiply. This helps to slow or stop the spread of cancer cells. This medicine may be used for other purposes; ask your health care provider or pharmacist if you have questions. COMMON BRAND NAME(S): Venclexta, VENCLEXTA Starting Pack What should I tell my care team before I take this medication? They need to know if you have any of these conditions: Gout High levels of uric acid in the blood Kidney disease Liver disease Low or high levels of potassium, phosphorus, or calcium in the blood Scheduled to receive a vaccine An unusual or allergic reaction to venetoclax, other medications, foods, dyes, or preservatives Pregnant or trying to get pregnant Breastfeeding How should I use this medication? Take this medication by mouth with a glass of water. Take it as directed on the prescription label at the same time every day. Do not cut, crush, or chew this medication. Swallow the tablets whole. Take it with food. Keep taking it unless your care team tells you to stop. Do not take with grapefruit juice or eat [place] oranges or starfruit. A special MedGuide will be given to you by the pharmacist with each prescription and refill. Be sure to read this information carefully each time. Talk to your care team about the use of this medication in children. Special care may be needed. Overdosage: If you think you have taken too much of this medicine contact a poison control center or emergency room at once. NOTE: This medicine is only for you. Do not share this medicine with others. What if I miss a dose? If you miss a dose, take it as soon as you can. If your next dose is to be taken in less than 16 hours, then do not take the missed dose. Take the next dose at your regular time. Do not take double or extra doses. If you vomit after a  dose, do not take another dose; take the next day's dose at the usual time. What may interact with this medication? Bosentan Calcium channel blockers, such as diltiazem, verapamil Captopril Carvedilol Certain antibiotics, such as azithromycin, erythromycin, clarithromycin Certain medications for fungal infections, such as fluconazole, itraconazole, ketoconazole, posaconazole, voriconazole Certain medications for irregular heart beat, such as amiodarone, dronedarone, quinidine Certain medications for seizures, such as carbamazepine, phenytoin Ciprofloxacin Conivaptan Cyclosporine Digoxin Efavirenz Etravirine Everolimus Felodipine Grapefruit products, [place] oranges, or starfruit Indinavir Live virus vaccines Lopinavir Modafinil Nafcillin Quercetin Ranolazine Rifampin Ritonavir Sirolimus St. John's wort Telaprevir Ticagrelor Warfarin This list may not describe all possible interactions. Give your health care provider a list of all the medicines, herbs, non-prescription drugs, or dietary supplements you use. Also tell them if you smoke, drink alcohol, or use illegal drugs. Some items may interact with your medicine. What should I watch for while using this medication? Your condition will be monitored carefully while you are receiving this medication. You may need blood work while taking this medication. This medication can cause serious reactions. To reduce the risk, your care team may give you other medications to take before receiving this one. Be sure to follow the directions from your care team. Drink plenty of fluids while you are taking this medication. This medication may increase your risk of getting an infection. Call your care team for advice if you get a fever, chills, sore throat, or other symptoms of a cold or flu. Do  not treat yourself. Try to avoid being around people who are sick. Talk to your care team if you may be pregnant. Serious birth defects can occur if  you take this medication during pregnancy and for 30 days after the last dose. You will need a negative pregnancy test before starting this medication. Contraception is recommended while taking this medication and for 30 days after the last dose. Your care team can help you find the option that works for you. Do not breastfeed while taking this medication and for 1 week after the last dose. This medication may cause infertility. Talk to your care team if you are concerned about your fertility. What side effects may I notice from receiving this medication? Side effects that you should report to your care team as soon as possible: Allergic reactions--skin rash, itching, hives, swelling of the face, lips, tongue, or throat Infection--fever, chills, cough, sore throat, wounds that don't heal, pain or trouble when passing urine, general feeling of discomfort or being unwell Low red blood cell level--unusual weakness or fatigue, dizziness, headache, trouble breathing Tumor lysis syndrome (TLS)--nausea, vomiting, diarrhea, decrease in the amount of urine, dark urine, unusual weakness or fatigue, confusion, muscle pain or cramps, fast or irregular heartbeat, joint pain Unusual bruising or bleeding Side effects that usually do not require medical attention (report to your care team if they continue or are bothersome): Bone, joint, or muscle pain Constipation Cough Diarrhea Fatigue Nausea Swelling of the ankles, hands, or feet This list may not describe all possible side effects. Call your doctor for medical advice about side effects. You may report side effects to FDA at 1-800-FDA-1088. Where should I keep my medication? Keep out of the reach of children and pets. Store at or below 30 degrees C (86 degrees F). Keep this medication in the original container. Protect from moisture. Keep the container tightly closed. Get rid of any unused medication after the expiration date. To get rid of medications that  are no longer needed or have expired: Take the medication to a medication take-back program. Check with your pharmacy or law enforcement to find a location. If you cannot return the medication, check the label or package insert to see if the medication should be thrown out in the garbage or flushed down the toilet. If you are not sure, ask your care team. If it is safe to put it in the trash, empty the medication out of the container. Mix the medication with cat litter, dirt, coffee grounds, or other unwanted substance. Seal the mixture in a bag or container. Put it in the trash. NOTE: This sheet is a summary. It may not cover all possible information. If you have questions about this medicine, talk to your doctor, pharmacist, or health care provider.  2023 Elsevier/Gold Standard (2022-04-02 00:00:00)

## 2022-10-21 NOTE — Progress Notes (Signed)
[facility] OFFICE PROGRESS NOTE   Diagnosis: Amyloidosis  INTERVAL HISTORY:   Brent Allison returns as scheduled.  He is here with his daughter.  He has noted improvement in his energy level while off of daratumumab.  No new complaint.  He bruises easily.  He plans to move into an assisted living facility in a few weeks.  His daughter and wife are moving 11/15/2022 to a memory care unit in the same facility. He reports improvement improvement in his cough since he completed the Augmentin by pulmonary medicine.  Objective:  Vital signs in last 24 hours:  Blood pressure 123/60, pulse 79, temperature 98.1 F (36.7 C), temperature source Oral, resp. rate 18, height 6' (1.829 m), weight 195 lb 6.4 oz (88.6 kg), SpO2 100 %.    HEENT: No thrush.  Tiny ecchymosis at the right eyelid Resp: Lungs clear bilaterally Cardio: Regular rate and rhythm GI: No hepatosplenomegaly Vascular: No leg edema   Lab Results:  Lab Results  Component Value Date   WBC 8.1 10/21/2022   HGB 12.7 (L) 10/21/2022   HCT 38.1 (L) 10/21/2022   MCV 97.4 10/21/2022   PLT 171 10/21/2022   NEUTROABS 5.1 10/21/2022    CMP  Lab Results  Component Value Date   NA 138 10/21/2022   K 4.0 10/21/2022   CL 104 10/21/2022   CO2 26 10/21/2022   GLUCOSE 103 (H) 10/21/2022   BUN 27 (H) 10/21/2022   CREATININE 1.54 (H) 10/21/2022   CALCIUM 8.9 10/21/2022   PROT 5.8 (L) 10/21/2022   ALBUMIN 3.9 10/21/2022   AST 13 (L) 10/21/2022   ALT 9 10/21/2022   ALKPHOS 62 10/21/2022   BILITOT 0.4 10/21/2022   GFRNONAA 46 (L) 10/21/2022   GFRAA >60 04/24/2019    No results found for: "CEA1", "CEA", "TFT732", "CA125"  Lab Results  Component Value Date   INR 1.20 (L) 03/13/2013   LABPROT 42.6 (H) 11/20/2012    Imaging:  No results found.  Medications: I have reviewed the patient's current medications.   Assessment/Plan: Amyloid involving an eyelid biopsy 06/10/2011 "Bruising" at the eyelids and  mouth: Likely related to amyloidosis, persistent. Numbness and loss of vibratory sense at the fingertips: This predated Velcade-based therapy, but worsened. Now much improved following cervical spine surgery. Elevated serum free lambda light chains. The lambda light chains were lower on November 16 and slightly higher on 12/14/2011. Lambda light chain proteinuria. Bone marrow plasmacytosis: Variable increase in plasma cells noted on the bone marrow biopsy 07/29/2011 with plasma cells estimated to represent between 4% and 20% of the cellular population. Remote history of prostate cancer. Sleep apnea. Dyslipidemia. Report of pneumonia on 2 occasions in 2011. Admission to a hospital in [place], [place] October 2012 with "pneumonia." A chest x-ray at [facility] on November 2 was negative .   Low serum immunoglobulin G level. Plasma cell dyscrasia with associated amyloidosis.   Initiation of systemic therapy with Cytoxan, Velcade, and Decadron 08/12/2011. Cycle #2 was initiated on 09/16/2011. Cycle #3 was initiated on 10/15/2011. Velcade was placed on hold due to neuropathy. The serum free lambda light chains were decreased on October 08 2011. The serum free lambda light chains were slightly increased on 12/14/2011, lower on 12/29/2011 and 02/02/2012. Initiation of Revlimid/Decadron February 2013, cycle 2 started on 01/28/2012. High-dose melphalan chemotherapy followed by autologous stem cell infusion on 02/09/2013 Initiation of Velcade/Decadrontherapy 05/28/2013, he completed 2 cycles on a day 1, 4, 8, 11 schedule Improvement in the serum  free lambda light chains following induction Velcade/Decadron   Initiation of weekly Velcade/Decadron on 07/16/2013, last treatment on 10/16/2013 Improvement in the serum free lambda light chains on 08/21/2013 and 09/18/2013 [facility]) Normal serum free lambda light chains on 12/13/2013 Mild increase of the serum free lambda light chains beginning January  2016-stable  treatment on the Pronto study at [facility] beginning 12/17/2015, in  of study 11/23/2016 Started on phase 2b Prothena study-long-term safety/efficacy study 01/11/2017, discontinued May 2018  Doxycycline started 05/17/2017, changed to once daily August 2022 01/25/2022 bone marrow biopsy-hypercellular marrow with plasma cell neoplasm, 7%, plasma cells are predominantly small in size with occasional binucleation noted, no lymphocytosis, lambda predominant; 13q-/-13 detected, 1p32/1q21 detected, CCND1(BCL1)/IgH t(11;14) detected Daratumumab/Decadron initiated 01/28/2022 Serum free light chains lower 04/02/2022 Serum free light chains stable 06/24/2022 Last daratumumab 07/22/2022 13.  Soft tissue infection/cellulitis right second finger 03/12/2022-Augmentin 14.  04/16/2022 right peroneal vein DVT at the level of the mid calf, favored acute by radiologist; Eliquis initiated        Disposition: Brent Haff appears unchanged.  He reports improvement in his energy level since discontinuing daratumumab.  We will follow-up on the light chains from today.  We discussed venetoclax therapy.  He does not wish to begin a new treatment until after the holidays and his move to the assisted living facility.  He will return for an office and lab visit in 1 month.  Brent Coder, MD  10/21/2022  3:33 PM

## 2022-10-22 DIAGNOSIS — D23121 Other benign neoplasm of skin of left upper eyelid, including canthus: Secondary | ICD-10-CM | POA: Diagnosis not present

## 2022-10-25 LAB — KAPPA/LAMBDA LIGHT CHAINS
Kappa free light chain: 13 mg/L (ref 3.3–19.4)
Kappa, lambda light chain ratio: 0.15 — ABNORMAL LOW (ref 0.26–1.65)
Lambda free light chains: 89 mg/L — ABNORMAL HIGH (ref 5.7–26.3)

## 2022-10-30 ENCOUNTER — Emergency Department (HOSPITAL_COMMUNITY): Payer: Medicare Other

## 2022-10-30 ENCOUNTER — Other Ambulatory Visit: Payer: Self-pay

## 2022-10-30 ENCOUNTER — Inpatient Hospital Stay (HOSPITAL_COMMUNITY): Payer: Medicare Other | Admitting: Registered Nurse

## 2022-10-30 ENCOUNTER — Encounter (HOSPITAL_COMMUNITY): Payer: Self-pay

## 2022-10-30 ENCOUNTER — Encounter (HOSPITAL_COMMUNITY): Admission: EM | Disposition: E | Payer: Self-pay | Source: Home / Self Care | Attending: Pulmonary Disease

## 2022-10-30 ENCOUNTER — Inpatient Hospital Stay (HOSPITAL_COMMUNITY)
Admission: EM | Admit: 2022-10-30 | Discharge: 2022-11-22 | DRG: 853 | Disposition: E | Payer: Medicare Other | Attending: Pulmonary Disease | Admitting: Pulmonary Disease

## 2022-10-30 DIAGNOSIS — K567 Ileus, unspecified: Secondary | ICD-10-CM | POA: Diagnosis not present

## 2022-10-30 DIAGNOSIS — N170 Acute kidney failure with tubular necrosis: Secondary | ICD-10-CM | POA: Diagnosis not present

## 2022-10-30 DIAGNOSIS — Z86718 Personal history of other venous thrombosis and embolism: Secondary | ICD-10-CM

## 2022-10-30 DIAGNOSIS — R7303 Prediabetes: Secondary | ICD-10-CM | POA: Diagnosis present

## 2022-10-30 DIAGNOSIS — R042 Hemoptysis: Secondary | ICD-10-CM | POA: Diagnosis present

## 2022-10-30 DIAGNOSIS — Z862 Personal history of diseases of the blood and blood-forming organs and certain disorders involving the immune mechanism: Secondary | ICD-10-CM

## 2022-10-30 DIAGNOSIS — G8250 Quadriplegia, unspecified: Secondary | ICD-10-CM | POA: Diagnosis not present

## 2022-10-30 DIAGNOSIS — G4733 Obstructive sleep apnea (adult) (pediatric): Secondary | ICD-10-CM | POA: Diagnosis not present

## 2022-10-30 DIAGNOSIS — E8581 Light chain (AL) amyloidosis: Secondary | ICD-10-CM | POA: Diagnosis present

## 2022-10-30 DIAGNOSIS — I5032 Chronic diastolic (congestive) heart failure: Secondary | ICD-10-CM | POA: Diagnosis present

## 2022-10-30 DIAGNOSIS — E440 Moderate protein-calorie malnutrition: Secondary | ICD-10-CM | POA: Diagnosis present

## 2022-10-30 DIAGNOSIS — I517 Cardiomegaly: Secondary | ICD-10-CM | POA: Diagnosis not present

## 2022-10-30 DIAGNOSIS — K659 Peritonitis, unspecified: Secondary | ICD-10-CM

## 2022-10-30 DIAGNOSIS — I739 Peripheral vascular disease, unspecified: Secondary | ICD-10-CM | POA: Diagnosis present

## 2022-10-30 DIAGNOSIS — R1084 Generalized abdominal pain: Secondary | ICD-10-CM | POA: Diagnosis not present

## 2022-10-30 DIAGNOSIS — R198 Other specified symptoms and signs involving the digestive system and abdomen: Principal | ICD-10-CM | POA: Diagnosis present

## 2022-10-30 DIAGNOSIS — K631 Perforation of intestine (nontraumatic): Secondary | ICD-10-CM | POA: Diagnosis present

## 2022-10-30 DIAGNOSIS — Z888 Allergy status to other drugs, medicaments and biological substances status: Secondary | ICD-10-CM

## 2022-10-30 DIAGNOSIS — K6389 Other specified diseases of intestine: Secondary | ICD-10-CM | POA: Diagnosis not present

## 2022-10-30 DIAGNOSIS — D7389 Other diseases of spleen: Secondary | ICD-10-CM | POA: Diagnosis not present

## 2022-10-30 DIAGNOSIS — K5730 Diverticulosis of large intestine without perforation or abscess without bleeding: Secondary | ICD-10-CM | POA: Diagnosis not present

## 2022-10-30 DIAGNOSIS — I469 Cardiac arrest, cause unspecified: Secondary | ICD-10-CM | POA: Diagnosis not present

## 2022-10-30 DIAGNOSIS — A419 Sepsis, unspecified organism: Secondary | ICD-10-CM | POA: Diagnosis not present

## 2022-10-30 DIAGNOSIS — Z9989 Dependence on other enabling machines and devices: Secondary | ICD-10-CM

## 2022-10-30 DIAGNOSIS — K658 Other peritonitis: Secondary | ICD-10-CM | POA: Diagnosis present

## 2022-10-30 DIAGNOSIS — Z8719 Personal history of other diseases of the digestive system: Secondary | ICD-10-CM | POA: Diagnosis not present

## 2022-10-30 DIAGNOSIS — Z9481 Bone marrow transplant status: Secondary | ICD-10-CM

## 2022-10-30 DIAGNOSIS — J189 Pneumonia, unspecified organism: Secondary | ICD-10-CM | POA: Diagnosis not present

## 2022-10-30 DIAGNOSIS — K650 Generalized (acute) peritonitis: Secondary | ICD-10-CM | POA: Diagnosis present

## 2022-10-30 DIAGNOSIS — Z9484 Stem cells transplant status: Secondary | ICD-10-CM

## 2022-10-30 DIAGNOSIS — N1831 Chronic kidney disease, stage 3a: Secondary | ICD-10-CM | POA: Diagnosis present

## 2022-10-30 DIAGNOSIS — J9601 Acute respiratory failure with hypoxia: Secondary | ICD-10-CM | POA: Diagnosis not present

## 2022-10-30 DIAGNOSIS — M1990 Unspecified osteoarthritis, unspecified site: Secondary | ICD-10-CM | POA: Diagnosis not present

## 2022-10-30 DIAGNOSIS — K55039 Acute (reversible) ischemia of large intestine, extent unspecified: Secondary | ICD-10-CM | POA: Diagnosis not present

## 2022-10-30 DIAGNOSIS — I340 Nonrheumatic mitral (valve) insufficiency: Secondary | ICD-10-CM | POA: Diagnosis not present

## 2022-10-30 DIAGNOSIS — R9431 Abnormal electrocardiogram [ECG] [EKG]: Secondary | ICD-10-CM | POA: Diagnosis not present

## 2022-10-30 DIAGNOSIS — Z8579 Personal history of other malignant neoplasms of lymphoid, hematopoietic and related tissues: Secondary | ICD-10-CM

## 2022-10-30 DIAGNOSIS — Z8546 Personal history of malignant neoplasm of prostate: Secondary | ICD-10-CM

## 2022-10-30 DIAGNOSIS — L89151 Pressure ulcer of sacral region, stage 1: Secondary | ICD-10-CM | POA: Diagnosis present

## 2022-10-30 DIAGNOSIS — E874 Mixed disorder of acid-base balance: Secondary | ICD-10-CM | POA: Diagnosis present

## 2022-10-30 DIAGNOSIS — R809 Proteinuria, unspecified: Secondary | ICD-10-CM | POA: Diagnosis present

## 2022-10-30 DIAGNOSIS — J90 Pleural effusion, not elsewhere classified: Secondary | ICD-10-CM | POA: Diagnosis not present

## 2022-10-30 DIAGNOSIS — K668 Other specified disorders of peritoneum: Secondary | ICD-10-CM | POA: Diagnosis present

## 2022-10-30 DIAGNOSIS — S31109A Unspecified open wound of abdominal wall, unspecified quadrant without penetration into peritoneal cavity, initial encounter: Secondary | ICD-10-CM | POA: Diagnosis not present

## 2022-10-30 DIAGNOSIS — Z66 Do not resuscitate: Secondary | ICD-10-CM | POA: Diagnosis not present

## 2022-10-30 DIAGNOSIS — D688 Other specified coagulation defects: Secondary | ICD-10-CM | POA: Diagnosis present

## 2022-10-30 DIAGNOSIS — I700 Atherosclerosis of aorta: Secondary | ICD-10-CM | POA: Diagnosis not present

## 2022-10-30 DIAGNOSIS — R918 Other nonspecific abnormal finding of lung field: Secondary | ICD-10-CM | POA: Diagnosis not present

## 2022-10-30 DIAGNOSIS — K8020 Calculus of gallbladder without cholecystitis without obstruction: Secondary | ICD-10-CM | POA: Diagnosis not present

## 2022-10-30 DIAGNOSIS — Z7901 Long term (current) use of anticoagulants: Secondary | ICD-10-CM

## 2022-10-30 DIAGNOSIS — D6959 Other secondary thrombocytopenia: Secondary | ICD-10-CM | POA: Diagnosis present

## 2022-10-30 DIAGNOSIS — Z79899 Other long term (current) drug therapy: Secondary | ICD-10-CM

## 2022-10-30 DIAGNOSIS — D72822 Plasmacytosis: Secondary | ICD-10-CM | POA: Diagnosis present

## 2022-10-30 DIAGNOSIS — Z6829 Body mass index (BMI) 29.0-29.9, adult: Secondary | ICD-10-CM

## 2022-10-30 DIAGNOSIS — D62 Acute posthemorrhagic anemia: Secondary | ICD-10-CM | POA: Diagnosis not present

## 2022-10-30 DIAGNOSIS — R6521 Severe sepsis with septic shock: Secondary | ICD-10-CM | POA: Diagnosis not present

## 2022-10-30 DIAGNOSIS — Z87891 Personal history of nicotine dependence: Secondary | ICD-10-CM

## 2022-10-30 DIAGNOSIS — Z4682 Encounter for fitting and adjustment of non-vascular catheter: Secondary | ICD-10-CM | POA: Diagnosis not present

## 2022-10-30 DIAGNOSIS — D539 Nutritional anemia, unspecified: Secondary | ICD-10-CM | POA: Diagnosis not present

## 2022-10-30 DIAGNOSIS — Z9049 Acquired absence of other specified parts of digestive tract: Secondary | ICD-10-CM | POA: Diagnosis not present

## 2022-10-30 DIAGNOSIS — J439 Emphysema, unspecified: Secondary | ICD-10-CM | POA: Diagnosis not present

## 2022-10-30 DIAGNOSIS — K559 Vascular disorder of intestine, unspecified: Secondary | ICD-10-CM | POA: Diagnosis not present

## 2022-10-30 DIAGNOSIS — D7589 Other specified diseases of blood and blood-forming organs: Secondary | ICD-10-CM | POA: Diagnosis present

## 2022-10-30 DIAGNOSIS — J9811 Atelectasis: Secondary | ICD-10-CM | POA: Diagnosis not present

## 2022-10-30 DIAGNOSIS — Z86711 Personal history of pulmonary embolism: Secondary | ICD-10-CM

## 2022-10-30 DIAGNOSIS — J9690 Respiratory failure, unspecified, unspecified whether with hypoxia or hypercapnia: Secondary | ICD-10-CM | POA: Diagnosis not present

## 2022-10-30 DIAGNOSIS — S31609A Unspecified open wound of abdominal wall, unspecified quadrant with penetration into peritoneal cavity, initial encounter: Secondary | ICD-10-CM | POA: Diagnosis not present

## 2022-10-30 DIAGNOSIS — K9189 Other postprocedural complications and disorders of digestive system: Secondary | ICD-10-CM | POA: Diagnosis not present

## 2022-10-30 DIAGNOSIS — K5900 Constipation, unspecified: Secondary | ICD-10-CM | POA: Diagnosis not present

## 2022-10-30 DIAGNOSIS — D649 Anemia, unspecified: Secondary | ICD-10-CM | POA: Diagnosis not present

## 2022-10-30 DIAGNOSIS — E785 Hyperlipidemia, unspecified: Secondary | ICD-10-CM | POA: Diagnosis present

## 2022-10-30 DIAGNOSIS — Z800 Family history of malignant neoplasm of digestive organs: Secondary | ICD-10-CM

## 2022-10-30 DIAGNOSIS — I21A1 Myocardial infarction type 2: Secondary | ICD-10-CM | POA: Diagnosis not present

## 2022-10-30 DIAGNOSIS — R5381 Other malaise: Secondary | ICD-10-CM | POA: Diagnosis present

## 2022-10-30 DIAGNOSIS — Z9221 Personal history of antineoplastic chemotherapy: Secondary | ICD-10-CM

## 2022-10-30 DIAGNOSIS — N179 Acute kidney failure, unspecified: Secondary | ICD-10-CM | POA: Diagnosis not present

## 2022-10-30 HISTORY — PX: LAPAROTOMY: SHX154

## 2022-10-30 LAB — I-STAT CHEM 8, ED
BUN: 23 mg/dL (ref 8–23)
Calcium, Ion: 1.16 mmol/L (ref 1.15–1.40)
Chloride: 105 mmol/L (ref 98–111)
Creatinine, Ser: 1.7 mg/dL — ABNORMAL HIGH (ref 0.61–1.24)
Glucose, Bld: 139 mg/dL — ABNORMAL HIGH (ref 70–99)
HCT: 37 % — ABNORMAL LOW (ref 39.0–52.0)
Hemoglobin: 12.6 g/dL — ABNORMAL LOW (ref 13.0–17.0)
Potassium: 3.5 mmol/L (ref 3.5–5.1)
Sodium: 141 mmol/L (ref 135–145)
TCO2: 22 mmol/L (ref 22–32)

## 2022-10-30 LAB — CBC WITH DIFFERENTIAL/PLATELET
Abs Immature Granulocytes: 0.04 10*3/uL (ref 0.00–0.07)
Basophils Absolute: 0 10*3/uL (ref 0.0–0.1)
Basophils Relative: 0 %
Eosinophils Absolute: 0.1 10*3/uL (ref 0.0–0.5)
Eosinophils Relative: 1 %
HCT: 40 % (ref 39.0–52.0)
Hemoglobin: 13.2 g/dL (ref 13.0–17.0)
Immature Granulocytes: 0 %
Lymphocytes Relative: 29 %
Lymphs Abs: 3.3 10*3/uL (ref 0.7–4.0)
MCH: 32.8 pg (ref 26.0–34.0)
MCHC: 33 g/dL (ref 30.0–36.0)
MCV: 99.3 fL (ref 80.0–100.0)
Monocytes Absolute: 0.8 10*3/uL (ref 0.1–1.0)
Monocytes Relative: 7 %
Neutro Abs: 7 10*3/uL (ref 1.7–7.7)
Neutrophils Relative %: 63 %
Platelets: 189 10*3/uL (ref 150–400)
RBC: 4.03 MIL/uL — ABNORMAL LOW (ref 4.22–5.81)
RDW: 13.3 % (ref 11.5–15.5)
WBC: 11.3 10*3/uL — ABNORMAL HIGH (ref 4.0–10.5)
nRBC: 0 % (ref 0.0–0.2)

## 2022-10-30 LAB — COMPREHENSIVE METABOLIC PANEL
ALT: 12 U/L (ref 0–44)
AST: 23 U/L (ref 15–41)
Albumin: 3.3 g/dL — ABNORMAL LOW (ref 3.5–5.0)
Alkaline Phosphatase: 71 U/L (ref 38–126)
Anion gap: 12 (ref 5–15)
BUN: 23 mg/dL (ref 8–23)
CO2: 20 mmol/L — ABNORMAL LOW (ref 22–32)
Calcium: 9.1 mg/dL (ref 8.9–10.3)
Chloride: 107 mmol/L (ref 98–111)
Creatinine, Ser: 1.75 mg/dL — ABNORMAL HIGH (ref 0.61–1.24)
GFR, Estimated: 40 mL/min — ABNORMAL LOW (ref >60)
Glucose, Bld: 140 mg/dL — ABNORMAL HIGH (ref 70–99)
Potassium: 3.5 mmol/L (ref 3.5–5.1)
Sodium: 139 mmol/L (ref 135–145)
Total Bilirubin: 0.6 mg/dL (ref 0.3–1.2)
Total Protein: 5.4 g/dL — ABNORMAL LOW (ref 6.5–8.1)

## 2022-10-30 LAB — PROTIME-INR
INR: 1.6 — ABNORMAL HIGH (ref 0.8–1.2)
Prothrombin Time: 19.3 seconds — ABNORMAL HIGH (ref 11.4–15.2)

## 2022-10-30 LAB — LACTIC ACID, PLASMA: Lactic Acid, Venous: 2.3 mmol/L (ref 0.5–1.9)

## 2022-10-30 SURGERY — LAPAROTOMY, EXPLORATORY
Anesthesia: General

## 2022-10-30 MED ORDER — FENTANYL CITRATE (PF) 250 MCG/5ML IJ SOLN
INTRAMUSCULAR | Status: AC
  Filled 2022-10-30: qty 5

## 2022-10-30 MED ORDER — PIPERACILLIN-TAZOBACTAM 3.375 G IVPB 30 MIN
3.3750 g | Freq: Once | INTRAVENOUS | Status: AC
  Administered 2022-10-30: 3.375 g via INTRAVENOUS
  Filled 2022-10-30: qty 50

## 2022-10-30 MED ORDER — PROPOFOL 10 MG/ML IV BOLUS
INTRAVENOUS | Status: AC
  Filled 2022-10-30: qty 20

## 2022-10-30 MED ORDER — PIPERACILLIN-TAZOBACTAM 3.375 G IVPB
3.3750 g | Freq: Three times a day (TID) | INTRAVENOUS | Status: DC
  Administered 2022-10-31 – 2022-11-02 (×7): 3.375 g via INTRAVENOUS
  Filled 2022-10-30 (×9): qty 50

## 2022-10-30 MED ORDER — SODIUM CHLORIDE 0.9 % IV BOLUS
1000.0000 mL | Freq: Once | INTRAVENOUS | Status: AC
  Administered 2022-10-30: 1000 mL via INTRAVENOUS

## 2022-10-30 MED ORDER — ETOMIDATE 2 MG/ML IV SOLN
INTRAVENOUS | Status: DC | PRN
  Administered 2022-10-30: 20 mg via INTRAVENOUS

## 2022-10-30 MED ORDER — FEIBA NF IV SOLR: 50.0000 [IU]/kg | Status: DC

## 2022-10-30 MED ORDER — POLYETHYLENE GLYCOL 3350 17 G PO PACK: 17.0000 g | PACK | Freq: Every day | ORAL | Status: DC | PRN

## 2022-10-30 MED ORDER — FENTANYL CITRATE PF 50 MCG/ML IJ SOSY
100.0000 ug | PREFILLED_SYRINGE | Freq: Once | INTRAMUSCULAR | Status: AC
  Administered 2022-10-30: 100 ug via INTRAVENOUS
  Filled 2022-10-30: qty 2

## 2022-10-30 MED ORDER — ONDANSETRON HCL 4 MG/2ML IJ SOLN: 4.0000 mg | Freq: Four times a day (QID) | INTRAMUSCULAR | Status: DC | PRN

## 2022-10-30 MED ORDER — IOHEXOL 350 MG/ML SOLN
75.0000 mL | Freq: Once | INTRAVENOUS | Status: AC | PRN
  Administered 2022-10-30: 75 mL via INTRAVENOUS

## 2022-10-30 MED ORDER — DEXAMETHASONE SODIUM PHOSPHATE 10 MG/ML IJ SOLN
INTRAMUSCULAR | Status: DC | PRN
  Administered 2022-10-30: 5 mg via INTRAVENOUS

## 2022-10-30 MED ORDER — LACTATED RINGERS IV SOLN: INTRAVENOUS | Status: DC | PRN

## 2022-10-30 MED ORDER — EMPTY CONTAINERS FLEXIBLE MISC
900.0000 mg | Freq: Once | Status: AC
  Administered 2022-10-30: 900 mg via INTRAVENOUS
  Filled 2022-10-30: qty 80

## 2022-10-30 MED ORDER — DOCUSATE SODIUM 100 MG PO CAPS: 100.0000 mg | ORAL_CAPSULE | Freq: Two times a day (BID) | ORAL | Status: DC | PRN

## 2022-10-30 MED ORDER — FENTANYL CITRATE PF 50 MCG/ML IJ SOSY
50.0000 ug | PREFILLED_SYRINGE | Freq: Once | INTRAMUSCULAR | Status: AC
  Administered 2022-10-30: 50 ug via INTRAVENOUS
  Filled 2022-10-30: qty 1

## 2022-10-30 MED ORDER — LIDOCAINE 2% (20 MG/ML) 5 ML SYRINGE
INTRAMUSCULAR | Status: DC | PRN
  Administered 2022-10-30: 20 mg via INTRAVENOUS

## 2022-10-30 MED ORDER — ALBUMIN HUMAN 5 % IV SOLN: INTRAVENOUS | Status: DC | PRN

## 2022-10-30 MED ORDER — SUCCINYLCHOLINE CHLORIDE 200 MG/10ML IV SOSY
PREFILLED_SYRINGE | INTRAVENOUS | Status: DC | PRN
  Administered 2022-10-30: 160 mg via INTRAVENOUS

## 2022-10-30 MED ORDER — ROCURONIUM BROMIDE 10 MG/ML (PF) SYRINGE
PREFILLED_SYRINGE | INTRAVENOUS | Status: DC | PRN
  Administered 2022-10-30 – 2022-10-31 (×2): 50 mg via INTRAVENOUS

## 2022-10-30 MED ORDER — HYDROMORPHONE HCL 1 MG/ML IJ SOLN
0.5000 mg | INTRAMUSCULAR | Status: DC | PRN
  Administered 2022-10-31 – 2022-11-08 (×6): 0.5 mg via INTRAVENOUS
  Filled 2022-10-30 (×6): qty 0.5

## 2022-10-30 MED ORDER — FENTANYL CITRATE (PF) 250 MCG/5ML IJ SOLN
INTRAMUSCULAR | Status: DC | PRN
  Administered 2022-10-30: 100 ug via INTRAVENOUS
  Administered 2022-10-31: 50 ug via INTRAVENOUS

## 2022-10-30 SURGICAL SUPPLY — 43 items
APL PRP STRL LF DISP 70% ISPRP (MISCELLANEOUS) ×1
BAG COUNTER SPONGE SURGICOUNT (BAG) ×1 IMPLANT
BAG SPNG CNTER NS LX DISP (BAG) ×1
BLADE CLIPPER SURG (BLADE) IMPLANT
CANISTER SUCT 3000ML PPV (MISCELLANEOUS) ×1 IMPLANT
CHLORAPREP W/TINT 26 (MISCELLANEOUS) ×1 IMPLANT
COVER SURGICAL LIGHT HANDLE (MISCELLANEOUS) ×1 IMPLANT
DRAPE LAPAROSCOPIC ABDOMINAL (DRAPES) ×1 IMPLANT
DRAPE WARM FLUID 44X44 (DRAPES) ×1 IMPLANT
ELECT BLADE 6.5 EXT (BLADE) IMPLANT
ELECT CAUTERY BLADE 6.4 (BLADE) ×1 IMPLANT
ELECT REM PT RETURN 9FT ADLT (ELECTROSURGICAL) ×1
ELECTRODE REM PT RTRN 9FT ADLT (ELECTROSURGICAL) ×1 IMPLANT
GLOVE BIO SURGEON STRL SZ8 (GLOVE) IMPLANT
GLOVE BIOGEL M STRL SZ7.5 (GLOVE) ×1 IMPLANT
GLOVE INDICATOR 8.0 STRL GRN (GLOVE) ×2 IMPLANT
GOWN STRL REUS W/ TWL LRG LVL3 (GOWN DISPOSABLE) ×1 IMPLANT
GOWN STRL REUS W/TWL 2XL LVL3 (GOWN DISPOSABLE) ×1 IMPLANT
GOWN STRL REUS W/TWL LRG LVL3 (GOWN DISPOSABLE) ×1
HANDLE SUCTION POOLE (INSTRUMENTS) ×1 IMPLANT
KIT BASIN OR (CUSTOM PROCEDURE TRAY) ×1 IMPLANT
KIT TURNOVER KIT B (KITS) ×1 IMPLANT
LIGASURE IMPACT 36 18CM CVD LR (INSTRUMENTS) IMPLANT
NS IRRIG 1000ML POUR BTL (IV SOLUTION) ×2 IMPLANT
PACK GENERAL/GYN (CUSTOM PROCEDURE TRAY) ×1 IMPLANT
PAD ARMBOARD 7.5X6 YLW CONV (MISCELLANEOUS) ×1 IMPLANT
PENCIL SMOKE EVACUATOR (MISCELLANEOUS) ×1 IMPLANT
RELOAD PROXIMATE 75MM BLUE (ENDOMECHANICALS) ×2 IMPLANT
RELOAD STAPLE 75 3.8 BLU REG (ENDOMECHANICALS) IMPLANT
RETRACTOR WOUND ALXS 34CM XLRG (MISCELLANEOUS) IMPLANT
RTRCTR WOUND ALEXIS 34CM XLRG (MISCELLANEOUS) ×1
SPONGE T-LAP 18X18 ~~LOC~~+RFID (SPONGE) IMPLANT
STAPLER PROXIMATE 75MM BLUE (STAPLE) IMPLANT
STAPLER VISISTAT 35W (STAPLE) ×1 IMPLANT
SUCTION POOLE HANDLE (INSTRUMENTS) ×1
SUT PDS AB 1 TP1 96 (SUTURE) ×2 IMPLANT
SUT SILK 2 0 SH CR/8 (SUTURE) ×1 IMPLANT
SUT SILK 2 0 TIES 10X30 (SUTURE) ×1 IMPLANT
SUT SILK 3 0 SH CR/8 (SUTURE) ×1 IMPLANT
SUT SILK 3 0 TIES 10X30 (SUTURE) ×1 IMPLANT
TOWEL GREEN STERILE (TOWEL DISPOSABLE) ×1 IMPLANT
TRAY FOLEY MTR SLVR 16FR STAT (SET/KITS/TRAYS/PACK) IMPLANT
YANKAUER SUCT BULB TIP NO VENT (SUCTIONS) IMPLANT

## 2022-10-30 NOTE — Progress Notes (Signed)
Pharmacy Antibiotic Note  Brent Allison is a 76 y.o. male admitted on 11/06/2022 with  bowel perforation .  Pharmacy has been consulted for Zosyn dosing.  Plan: Zosyn 3.375g IV q8h (4 hour infusion).  Height: 6' (182.9 cm) Weight: 85.3 kg (188 lb) IBW/kg (Calculated) : 77.6  Temp (24hrs), Avg:97.5 F (36.4 C), Min:97.5 F (36.4 C), Max:97.5 F (36.4 C)  Recent Labs  Lab 10/24/2022 2100 11/07/2022 2101 10/29/2022 2125  WBC  --  11.3*  --   CREATININE  --  1.75* 1.70*  LATICACIDVEN 2.3*  --   --     Estimated Creatinine Clearance: 39.9 mL/min (A) (by C-G formula based on SCr of 1.7 mg/dL (H)).    Allergies  Allergen Reactions   Enoxaparin Other (See Comments)    Blood clots HIT [SRA +]   Heparin Other (See Comments)    Heparin induced thrombocytopenia (SRA +)     Thank you for allowing pharmacy to be a part of this patient's care.  Wynona Neat, PharmD, BCPS  11/16/2022 11:27 PM

## 2022-10-30 NOTE — ED Provider Notes (Signed)
[facility] DEPARTMENT Provider Note   CSN: [id number] Arrival date & time: 10/29/2022  2053     History  Chief Complaint  Patient presents with   Abdominal Pain    Patient BIB by [facility] from home with complaint of sudden onset of severe abdominal pain after eating dinner, reports stabbing pain.      Brent Allison is a 76 y.o. male.   Abdominal Pain Patient presents with severe mid abdominal pain.  Began around 730 after eating.  Diffuse over his whole abdomen.  No nausea or vomiting.  Severe tenderness lower.  On Eliquis for previous clot in his leg.  History of amyloidosis.  Had dose of Eliquis this morning.    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 [name], lovenox maintained since Spring 2013 , pt. aware that last dose is sch. for 07/22/2012   Pneumonia    hx of with last time in Nov 2012   Prostate cancer [facility])    amyloidosis, multiple myeloma    Sleep apnea    severe with AHI 44.4 events per hour - now on CPAP at 11cm H2O   Spastic quadriparesis (HCC)     Home Medications Prior to Admission medications   Medication Sig Start Date End Date Taking? Authorizing Provider  apixaban (ELIQUIS) 5 MG TABS tablet Take 1 tablet (5 mg total) by mouth 2 (two) times daily. 08/20/22  Yes Ladell Pier, MD  Cyanocobalamin (VITAMIN B 12 PO) Take 1,000 mg by mouth daily.   Yes [provider]  furosemide (LASIX) 20 MG tablet Take 40 mg by mouth every other day. Extra 20 mg on Saturday 06/17/17  Yes [provider]  OVER THE COUNTER MEDICATION  Take 1 tablet by mouth at bedtime as needed (As needed for insomnia).   Yes [provider]  rosuvastatin (CRESTOR) 10 MG tablet Take 10 mg by mouth daily.   Yes [provider]  spironolactone (ALDACTONE) 25 MG tablet Take 25 mg by mouth daily. 05/28/19  Yes [provider]      Allergies    Enoxaparin and Heparin    Review of Systems   Review of Systems  Gastrointestinal:  Positive for abdominal pain.    Physical Exam Updated Vital Signs BP 96/65   Pulse (!) 106   Temp (!) 97.5 F (36.4 C) (Oral)   Resp (!) 33   Ht 6' (1.829 m)   Wt 85.3 kg   SpO2 90%   BMI 25.50 kg/m  Physical Exam Vitals reviewed.  Constitutional:      Comments: Patient appears to be in pain.  Cardiovascular:     Rate and Rhythm: Normal rate.  Abdominal:     Comments: Mildly protuberant abdomen with guarding.  Diffuse tenderness.  Skin:    Capillary Refill: Capillary refill takes less than 2 seconds.  Neurological:     Mental Status: He is alert.     ED Results / Procedures / Treatments   Labs (all labs ordered  are listed, but only abnormal results are displayed) Labs Reviewed  CBC WITH DIFFERENTIAL/PLATELET - Abnormal; Notable for the following components:      Result Value   WBC 11.3 (*)    RBC 4.03 (*)    All other components within normal limits  COMPREHENSIVE METABOLIC PANEL - Abnormal; Notable for the following components:   CO2 20 (*)    Glucose, Bld 140 (*)    Creatinine, Ser 1.75 (*)    Total Protein 5.4 (*)    Albumin 3.3 (*)    GFR, Estimated 40 (*)    All other components within normal limits  PROTIME-INR - Abnormal; Notable for the following components:   Prothrombin Time 19.3 (*)    INR 1.6 (*)    All other components within normal limits  I-STAT CHEM 8, ED - Abnormal; Notable for the following components:   Creatinine, Ser 1.70 (*)    Glucose, Bld 139 (*)    Hemoglobin 12.6 (*)    HCT 37.0 (*)    All other components within normal limits   CULTURE, BLOOD (ROUTINE X 2)  CULTURE, BLOOD (ROUTINE X 2)  LACTIC ACID, PLASMA  LACTIC ACID, PLASMA  TYPE AND SCREEN    EKG EKG Interpretation  Date/Time:  Saturday October 30 2022 20:57:40 EST Ventricular Rate:  88 PR Interval:  203 QRS Duration: 110 QT Interval:  410 QTC Calculation: 497 R Axis:   -75 Text Interpretation: Sinus or ectopic atrial rhythm Left anterior fascicular block Anterior infarct, old Confirmed by Davonna Belling [phone]) on 10/28/2022 9:54:14 PM  Radiology CT Angio Abd/Pel W and/or Wo Contrast  Result Date: 11/16/2022 CLINICAL DATA:  Acute mesenteric ischemia. EXAM: CTA ABDOMEN AND PELVIS WITHOUT AND WITH CONTRAST TECHNIQUE: Multidetector CT imaging of the abdomen and pelvis was performed using the standard protocol during bolus administration of intravenous contrast. Multiplanar reconstructed images and MIPs were obtained and reviewed to evaluate the vascular anatomy. RADIATION DOSE REDUCTION: This exam was performed according to the departmental dose-optimization program which includes automated exposure control, adjustment of the mA and/or kV according to patient size and/or use of iterative reconstruction technique. CONTRAST:  18m OMNIPAQUE IOHEXOL 350 MG/ML SOLN COMPARISON:  10/29/2012. FINDINGS: VASCULAR Aorta: Aortic atherosclerosis. Normal caliber aorta without aneurysm, dissection, vasculitis or significant stenosis. Celiac: Patent without evidence of aneurysm, dissection, vasculitis or significant stenosis. SMA: Patent without evidence of aneurysm, dissection, vasculitis or significant stenosis. Renals: Both renal arteries are patent without evidence of aneurysm, dissection, vasculitis, fibromuscular dysplasia or significant stenosis. An accessory renal artery is noted on the right. IMA: Patent. Inflow: Patent without evidence of aneurysm, dissection, vasculitis or significant stenosis. Proximal Outflow: Bilateral common femoral and visualized portions of  the superficial and profunda femoral arteries are patent without evidence of aneurysm, dissection, vasculitis or significant stenosis. Veins: No portal venous gas. Review of the MIP images confirms the above findings. NON-VASCULAR Lower chest: Heart is enlarged and there is a small pericardial effusion. There are trace bilateral pleural effusions with atelectasis at the lung bases. Hepatobiliary: No focal liver abnormality is seen. Stones are present within the gallbladder. No biliary ductal dilatation. Pancreas: Unremarkable. No pancreatic ductal dilatation or surrounding inflammatory changes. Spleen: Normal size. A vague hypodensity is present in the liver, most likely cyst or hemangioma. Adrenals/Urinary Tract: No adrenal nodule or mass. The kidneys enhance symmetrically. No renal calculus or hydronephrosis. Bladder is unremarkable. Stomach/Bowel: The stomach is distended with fluid and ingested debris. No bowel obstruction. Multiple foci of free air are  noted in the upper abdomen and most pronounced in the mid left abdomen. Multiple scattered diverticula are present along the colon without evidence of diverticulitis. A moderate amount of retained stool is present in the colon. Mild colonic wall thickening involving the mid to distal transverse colon with suspected pneumatosis. Fat stranding is noted in the left upper quadrant. There is a focal defect in the wall of the mid transverse colon, axial images 38 and 39, with perforation and focal collection of debris and air measuring 9.9 x 3.5 cm. Appendix is not seen. Lymphatic: No abdominal or pelvic lymphadenopathy. Reproductive: Prostate is unremarkable. Other: No abdominopelvic ascites. Musculoskeletal: Spinal fusion hardware is present L2-L5. Degenerative changes in the thoracolumbar spine. No acute or suspicious osseous abnormality. IMPRESSION: VASCULAR Aortic atherosclerosis without evidence of aneurysm. The aorta and large branch vessels are patent.  NON-VASCULAR 1. Mild colonic wall thickening involving the mid to distal transverse colon with suspected pneumatosis. No portal venous gas is seen, however findings remain concerning for mesenteric ischemia. There is a focal perforation in the distal transverse colon in the left upper quadrant with leakage of stool and free air in the abdomen, best visualized on axial images 38 and 39. 2. Trace bilateral pleural effusions with atelectasis at the lung bases. 3. Cholelithiasis. 4. Diverticulosis without diverticulitis. 5. Remaining incidental findings as described above. Critical Value/emergent results were called by telephone at the time of interpretation on 11/21/2022 at 9:39 pm to provider Davonna Belling , who verbally acknowledged these results. Electronically Signed   By: Brett Fairy M.D.   On: 10/31/2022 22:12    Procedures Procedures    Medications Ordered in ED Medications  fentaNYL (SUBLIMAZE) injection 50 mcg (50 mcg Intravenous Given 10/25/2022 2131)  sodium chloride 0.9 % bolus 1,000 mL (0 mLs Intravenous Stopped 11/17/2022 2217)  piperacillin-tazobactam (ZOSYN) IVPB 3.375 g (0 g Intravenous Stopped 11/21/2022 2221)  sodium chloride 0.9 % bolus 1,000 mL (1,000 mLs Intravenous New Bag/Given 11/09/2022 2133)  iohexol (OMNIPAQUE) 350 MG/ML injection 75 mL (75 mLs Intravenous Contrast Given 11/19/2022 2125)  fentaNYL (SUBLIMAZE) injection 100 mcg (100 mcg Intravenous Given 11/07/2022 2219)  coag fact Xa recombinant (ANDEXXA) low dose infusion 900 mg (900 mg Intravenous New Bag/Given 11/19/2022 2257)  sodium chloride 0.9 % bolus 1,000 mL (1,000 mLs Intravenous New Bag/Given 11/20/2022 2239)    ED Course/ Medical Decision Making/ A&P                           Medical Decision Making Amount and/or Complexity of Data Reviewed Labs: ordered. Radiology: ordered.  Risk Prescription drug management. Decision regarding hospitalization.   Patient severe abdominal pain.  Hypotensive peer differential diagnosis  is somewhat long but does include severe causes such as AAA, mesenteric ischemia, perforated viscus.  He is hypotensive.  Will give fluid bolus and small amount of pain medicine.  Will get emergent CT scan.  With the hypotension and history of occlusion do not think we should wait for kidney function and I think it is worth the risk to get the emergent scan with contrast prior to labs.   CT scan shows perforated bowel with free air and free stool in the abdomen.  Discussed with radiologist and independently interpreted myself.  Does appear to be transverse colon as the source.  Discussed with Dr. Redmond Pulling immediately upon seeing the scan.  He recommends consult with pharmacy for reversal and discussed with ICU.  Discussed with Dr. Minda Meo from pulmonary  critical care.  Fluid boluses given.  Can use peripheral Levophed if needed.  Will gently get pain medicine.  Will require urgent/emergent OR.  However will require reversal.  Discussed with pharmacist and will give Andexxa since we do not have enough Feiba for emergent reversal here.  Seen by Dr. Redmond Pulling.  Will take emergently to the OR.  Fluid boluses given and BP has had increased.  CRITICAL CARE Performed by: Davonna Belling Total critical care time: CRITICAL CARE Performed by: Davonna Belling Total critical care time: 30 minutes Critical care time was exclusive of separately billable procedures and treating other patients. Critical care was necessary to treat or prevent imminent or life-threatening deterioration. Critical care was time spent personally by me on the following activities: development of treatment plan with patient and/or surrogate as well as nursing, discussions with consultants, evaluation of patient's response to treatment, examination of patient, obtaining history from patient or surrogate, ordering and performing treatments and interventions, ordering and review of laboratory studies, ordering and review of radiographic studies,  pulse oximetry and re-evaluation of patient's condition.  minutes Critical care time was exclusive of separately billable procedures and treating other patients. Critical care was necessary to treat or prevent imminent or life-threatening deterioration. Critical care was time spent personally by me on the following activities: development of treatment plan with patient and/or surrogate as well as nursing, discussions with consultants, evaluation of patient's response to treatment, examination of patient, obtaining history from patient or surrogate, ordering and performing treatments and interventions, ordering and review of laboratory studies, ordering and review of radiographic studies, pulse oximetry and re-evaluation of patient's condition.            Final Clinical Impression(s) / ED Diagnoses Final diagnoses:  Perforated abdominal viscus    Rx / DC Orders ED Discharge Orders     None         Davonna Belling, MD 11/06/2022 2307

## 2022-10-30 NOTE — Consult Note (Signed)
CC: Severe abdominal pain  Requesting provider: Dr. Alvino Chapel  HPI: Brent Allison is an 76 y.o. male who is here for evaluation for acute onset severe abdominal pain.  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  He reports acute onset of severe abdominal pain.  No fever or chills.  No vomiting.  May be some nausea.  Prior abdominal surgery includes a prostatectomy and hernia repair.  He thinks the use mesh.  He states he had been feeling his normal self the past few days.  He denies any recent episodes of abdominal pain and reports normal bowel movements.  History is a little bit limited due to his severe pain  On Presentation he had Solik blood pressure of 90.  He has received some IV fluids as well as Zosyn.  A CT scan was performed which demonstrated free air and perforation of what appears to be the transverse colon.  His daughter is at the bedside.  He denies any recent symptoms of heart failure.  No dyspnea exertion, shortness of breath, peripheral edema.  He last took Eliquis this morning.  Past Medical History:  Diagnosis Date   AL amyloidosis ([place]) 11/04/2012   Arthritis    lumbar DDD   Blood dyscrasia    plasma cell dyscrasia with associated amyloidosis   CKD (chronic kidney disease)    DVT (deep venous thrombosis) ([facility])    right   Dyslipidemia    H/O multiple myeloma    Heparin induced thrombocytopenia (HIT) ([place]) 11/04/2012   Joint pain    Peripheral vascular disease ([place])    DVT- 01/2012, follows by Dr. [name], lovenox maintained since Spring 2013 , pt. aware that last dose is sch. for 07/22/2012   Pneumonia    hx of with last time in Nov 2012   Prostate cancer [facility])    amyloidosis, multiple myeloma     Sleep apnea    severe with AHI 44.4 events per hour - now on CPAP at 11cm H2O   Spastic quadriparesis [facility])     Past Surgical History:  Procedure Laterality Date   ANTERIOR CERVICAL DECOMP/DISCECTOMY FUSION  02/26/2012   Procedure: ANTERIOR CERVICAL DECOMPRESSION/DISCECTOMY FUSION 2 LEVELS;  Surgeon: Hosie Spangle, MD;  Location: [place] [facility];  Service: Neurosurgery;  Laterality: N/A;  C3-4 C4-5 Anterior cervical decompression/diskectomy, fusion   ANTERIOR CERVICAL DECOMP/DISCECTOMY FUSION N/A 12/21/2021   Procedure: CERVICAL FIVE-SIX ANTERIOR CERVICAL DECOMPRESSION/DISCECTOMY FUSION;  Surgeon: Newman Pies, MD;  Location: [place];  Service: Neurosurgery;  Laterality: N/A;  3C   BACK SURGERY     CARDIAC CATHETERIZATION  2010   CARPAL TUNNEL RELEASE  2009   left   COLONOSCOPY     HERNIA REPAIR     around 1985   ivt     IVC filter placed d/t blood clots, removed- July- 2013   LIMBAL STEM CELL TRANSPLANT     LIPOMA EXCISION  around [place]   removed from one of his shoulders   POSTERIOR CERVICAL FUSION/FORAMINOTOMY  04/06/2012   Procedure: POSTERIOR CERVICAL FUSION/FORAMINOTOMY LEVEL 3;  Surgeon: Hosie Spangle, MD;  Location: [facility];  Service: Neurosurgery;  Laterality: N/A;  C3 and C4 laminectomy with C23 C34 C45 posterior cervical arthrodesis  with instrumentation   POSTERIOR CERVICAL FUSION/FORAMINOTOMY N/A 06/02/2015   Procedure: Cervical Seven, Thoracic One, Thoracic Two Laminectomies with Cervical Seven-Thoracic One, Thoracic One-Thoracic Two Posterior Cervico-Thoracic  Arthrodesis with Instrumentation;  Surgeon: Jovita Gamma, MD;  Location: [place] [facility];  Service: Neurosurgery;  Laterality: N/A;  C7, T1, T2 laminectomies with C7-T2 cerevico thoracic arthrodesis with instrumentation   POSTERIOR LAMINECTOMY / DECOMPRESSION LUMBAR SPINE  around 2007   L4-5   PROSTATECTOMY  around 2005   TONSILLECTOMY     as a child    Family History  Problem Relation Age of Onset    Pancreatic cancer Mother    Anesthesia problems Neg Hx    Hypotension Neg Hx    Malignant hyperthermia Neg Hx    Pseudochol deficiency Neg Hx     Social:  reports that he quit smoking about 43 years ago. His smoking use included cigarettes. He has a 5.00 pack-year smoking history. He has never used smokeless tobacco. He reports current alcohol use of about 2.0 standard drinks of alcohol per week. He reports that he does not use drugs.  Allergies:  Allergies  Allergen Reactions   Enoxaparin Other (See Comments)    Blood clots HIT [SRA +]   Heparin Other (See Comments)    Heparin induced thrombocytopenia (SRA +)    Medications: I have reviewed the patient's current medications.   ROS -limited due to acuity of situation  PE Blood pressure 96/65, pulse (!) 106, temperature (!) 97.5 F (36.4 C), temperature source Oral, resp. rate (!) 33, height 6' (1.829 m), weight 85.3 kg, SpO2 90 %. Constitutional: appears ill, moaning ; conversant; no deformities Eyes: Moist conjunctiva; no lid lag; anicteric; PERRL Neck: Trachea midline; no thyromegaly Lungs: Normal respiratory effort; no tactile fremitus CV: RRR; no palpable thrills; no pitting edema GI: Abd distended, firm, peritonitis, old transverse scar near umbilicus, old lower midline suprapubic scar; no palpable hepatosplenomegaly MSK:  no clubbing/cyanosis Psychiatric: Appropriate affect; alert and oriented x3 Lymphatic: No palpable cervical or axillary lymphadenopathy Skin:no rash/lesions/jaundice  Results for orders placed or performed during the hospital encounter of 11/02/2022 (from the past 48 hour(s))  CBC with Differential     Status: Abnormal   Collection Time: 11/21/2022  9:01 PM  Result Value Ref Range   WBC 11.3 (H) 4.0 - 10.5 K/uL   RBC 4.03 (L) 4.22 - 5.81 MIL/uL   Hemoglobin 13.2 13.0 - 17.0 g/dL   HCT 40.0 39.0 - 52.0 %   MCV 99.3 80.0 - 100.0 fL   MCH 32.8 26.0 - 34.0 pg   MCHC 33.0 30.0 - 36.0 g/dL   RDW 13.3  11.5 - 15.5 %   Platelets 189 150 - 400 K/uL   nRBC 0.0 0.0 - 0.2 %   Neutrophils Relative % 63 %   Neutro Abs 7.0 1.7 - 7.7 K/uL   Lymphocytes Relative 29 %   Lymphs Abs 3.3 0.7 - 4.0 K/uL   Monocytes Relative 7 %   Monocytes Absolute 0.8 0.1 - 1.0 K/uL   Eosinophils Relative 1 %   Eosinophils Absolute 0.1 0.0 - 0.5 K/uL   Basophils Relative 0 %   Basophils Absolute 0.0 0.0 - 0.1 K/uL   Immature Granulocytes 0 %   Abs Immature Granulocytes 0.04 0.00 - 0.07 K/uL    Comment: Performed at [facility], [address], [place], [place] [address]  Comprehensive metabolic panel     Status: Abnormal   Collection Time: 11/18/2022  9:01 PM  Result Value Ref Range   Sodium 139 135 - 145 mmol/L   Potassium 3.5 3.5 - 5.1 mmol/L   Chloride 107 98 - 111 mmol/L   CO2 20 (L) 22 - 32 mmol/L   Glucose, Bld 140 (H) 70 - 99 mg/dL    Comment: Glucose reference range applies only to samples taken after fasting for at least 8 hours.   BUN 23 8 - 23 mg/dL   Creatinine, Ser 1.75 (H) 0.61 - 1.24 mg/dL   Calcium 9.1 8.9 - 10.3 mg/dL   Total Protein 5.4 (L) 6.5 - 8.1 g/dL   Albumin 3.3 (L) 3.5 - 5.0 g/dL   AST 23 15 - 41 U/L   ALT 12 0 - 44 U/L   Alkaline Phosphatase 71 38 - 126 U/L   Total Bilirubin 0.6 0.3 - 1.2 mg/dL   GFR, Estimated 40 (L) >60 mL/min    Comment: (NOTE) Calculated using the CKD-EPI Creatinine Equation (2021)    Anion gap 12 5 - 15    Comment: Performed at [facility], [place] [address], [place], [place] [address]  Protime-INR     Status: Abnormal   Collection Time: 11/07/2022  9:01 PM  Result Value Ref Range   Prothrombin Time 19.3 (H) 11.4 - 15.2 seconds   INR 1.6 (H) 0.8 - 1.2    Comment: (NOTE) INR goal varies based on device and disease states. Performed at [facility], [place] [address] [place], [place] [address]   I-stat chem 8, ED     Status: Abnormal   Collection Time: 11/02/2022  9:25 PM  Result Value Ref Range   Sodium 141 135 - 145 mmol/L   Potassium  3.5 3.5 - 5.1 mmol/L   Chloride 105 98 - 111 mmol/L   BUN 23 8 - 23 mg/dL   Creatinine, Ser 1.70 (H) 0.61 - 1.24 mg/dL   Glucose, Bld 139 (H) 70 - 99 mg/dL    Comment: Glucose reference range applies only to samples taken after fasting for at least 8 hours.   Calcium, Ion 1.16 1.15 - 1.40 mmol/L   TCO2 22 22 - 32 mmol/L   Hemoglobin 12.6 (L) 13.0 - 17.0 g/dL   HCT 37.0 (L) 39.0 - 52.0 %  Type and screen [place]     Status: None (Preliminary result)   Collection Time: 10/22/2022  9:26 PM  Result Value Ref Range   ABO/RH(D) PENDING    Antibody Screen PENDING    Sample Expiration      11/02/2022,2359 Performed at [facility], [place] [address], [place],  [address]     CT Angio Abd/Pel W and/or Wo Contrast  Result Date: 11/07/2022 CLINICAL DATA:  Acute mesenteric ischemia. EXAM: CTA ABDOMEN AND PELVIS WITHOUT AND WITH CONTRAST TECHNIQUE: Multidetector CT imaging of the abdomen and pelvis was performed using the standard protocol during bolus administration of intravenous contrast. Multiplanar reconstructed images and MIPs were obtained and reviewed to evaluate the vascular anatomy. RADIATION DOSE REDUCTION: This exam was performed according to the departmental dose-optimization program which includes automated exposure control, adjustment of the mA and/or kV according to patient size and/or use of iterative reconstruction technique. CONTRAST:  17m OMNIPAQUE IOHEXOL 350 MG/ML SOLN COMPARISON:  10/29/2012. FINDINGS: VASCULAR Aorta: Aortic atherosclerosis. Normal caliber aorta without aneurysm, dissection, vasculitis or significant stenosis. Celiac: Patent without evidence of aneurysm, dissection, vasculitis or significant stenosis. SMA: Patent without evidence of aneurysm, dissection, vasculitis or significant stenosis. Renals:  Both renal arteries are patent without evidence of aneurysm, dissection, vasculitis, fibromuscular dysplasia or significant stenosis. An  accessory renal artery is noted on the right. IMA: Patent. Inflow: Patent without evidence of aneurysm, dissection, vasculitis or significant stenosis. Proximal Outflow: Bilateral common femoral and visualized portions of the superficial and profunda femoral arteries are patent without evidence of aneurysm, dissection, vasculitis or significant stenosis. Veins: No portal venous gas. Review of the MIP images confirms the above findings. NON-VASCULAR Lower chest: Heart is enlarged and there is a small pericardial effusion. There are trace bilateral pleural effusions with atelectasis at the lung bases. Hepatobiliary: No focal liver abnormality is seen. Stones are present within the gallbladder. No biliary ductal dilatation. Pancreas: Unremarkable. No pancreatic ductal dilatation or surrounding inflammatory changes. Spleen: Normal size. A vague hypodensity is present in the liver, most likely cyst or hemangioma. Adrenals/Urinary Tract: No adrenal nodule or mass. The kidneys enhance symmetrically. No renal calculus or hydronephrosis. Bladder is unremarkable. Stomach/Bowel: The stomach is distended with fluid and ingested debris. No bowel obstruction. Multiple foci of free air are noted in the upper abdomen and most pronounced in the mid left abdomen. Multiple scattered diverticula are present along the colon without evidence of diverticulitis. A moderate amount of retained stool is present in the colon. Mild colonic wall thickening involving the mid to distal transverse colon with suspected pneumatosis. Fat stranding is noted in the left upper quadrant. There is a focal defect in the wall of the mid transverse colon, axial images 38 and 39, with perforation and focal collection of debris and air measuring 9.9 x 3.5 cm. Appendix is not seen. Lymphatic: No abdominal or pelvic lymphadenopathy. Reproductive: Prostate is unremarkable. Other: No abdominopelvic ascites. Musculoskeletal: Spinal fusion hardware is present L2-L5.  Degenerative changes in the thoracolumbar spine. No acute or suspicious osseous abnormality. IMPRESSION: VASCULAR Aortic atherosclerosis without evidence of aneurysm. The aorta and large branch vessels are patent. NON-VASCULAR 1. Mild colonic wall thickening involving the mid to distal transverse colon with suspected pneumatosis. No portal venous gas is seen, however findings remain concerning for mesenteric ischemia. There is a focal perforation in the distal transverse colon in the left upper quadrant with leakage of stool and free air in the abdomen, best visualized on axial images 38 and 39. 2. Trace bilateral pleural effusions with atelectasis at the lung bases. 3. Cholelithiasis. 4. Diverticulosis without diverticulitis. 5. Remaining incidental findings as described above. Critical Value/emergent results were called by telephone at the time of interpretation on 10/28/2022 at 9:39 pm to provider Davonna Belling , who verbally acknowledged these results. Electronically Signed   By: Brett Fairy M.D.   On: 11/16/2022 22:12    Imaging: Personally reviewed ct   Data reviewed  [facility] cardiology office note 08/06/22 Dr [name] office note 10/21/22  Form NP note 10/05/22 CT chest 10/2012 report CT angio a/p 10/2012 report [facility] oncology office note 07/14/22  Discussion of case with EDP and CCM attending  Transthoracic Echocardiogram  07/13/22 1. The left ventricle is normal in size with moderately increased wall thickness. 2. The left ventricular systolic function is normal, LVEF is visually estimated at > 55%. 3. There is grade II diastolic dysfunction (elevated filling pressure). 4. The mitral valve leaflets are mildly thickened with normal leaflet mobility. 5. The left atrium is mildly dilated in size. 6. The right ventricle is normal in size, with normal systolic function.   A/P: Brent Allison is an 76 y.o. male with  Peritonitis due to  transverse colon perforation possibly  secondary to mesenteric ischemia OSA Septic shock Chronic kidney disease AL amyloidosis Cardiac amyloidosis History of DVT March 2023 on Eliquis History of HIT and PE/SMV/portal venous thrombosis 3614 Chronic diastolic heart failure with preserved EF  Patient has peritonitis.  Evidence of free air and stool outside of the colon. He is displaying signs of septic shock with his hypotension and tachypnea  Recommended to him and his daughter that we proceed urgently to the operating room for exploratory laparotomy and partial colectomy possible ostomy.  We discussed the possibility that we may not be able to complete the entire planned procedure tonight if he becomes severely unstable during surgery and that we may have to leave his abdomen open and bring him back at a later time for further exploration and creation of colostomy.  Discussed the rationale why we would not restore intestinal continuity.  I discussed the procedure in detail.    We discussed the risks and benefits of surgery including, but not limited to bleeding, infection (such as wound infection, abdominal abscess), injury to surrounding structures, blood clot formation, urinary retention, incisional hernia,  anesthesia risks, pulmonary & cardiac complications such as pneumonia &/or heart attack, need for additional procedures, ileus, & prolonged hospitalization, renal failure, death, probable discharge to SNF.  We discussed the typical postoperative recovery course, including limitations & restrictions postoperatively. I explained that the likelihood of improvement in their symptoms is good but mod to high risk for complications including serious complications - see ACS NSQIP risk calculator below    ACS RISK CALCULATOR USE:  Risk Calculator was used for discussion of surgery: Yes       Leighton Ruff. Redmond Pulling, MD, FACS General, Bariatric, & Minimally Invasive Surgery [place]

## 2022-10-30 NOTE — Anesthesia Procedure Notes (Signed)
Procedure Name: Intubation Date/Time: 11/09/2022 11:33 PM  Performed by: Trinna Post., CRNAPre-anesthesia Checklist: Patient identified, Emergency Drugs available, Suction available, Patient being monitored and Timeout performed Patient Re-evaluated:Patient Re-evaluated prior to induction Oxygen Delivery Method: Circle system utilized Preoxygenation: Pre-oxygenation with 100% oxygen Induction Type: IV induction, Rapid sequence and Cricoid Pressure applied Laryngoscope Size: Glidescope and 4 Grade View: Grade I Tube type: Oral Tube size: 7.5 mm Number of attempts: 1 Airway Equipment and Method: Rigid stylet and Video-laryngoscopy Placement Confirmation: ETT inserted through vocal cords under direct vision, positive ETCO2 and breath sounds checked- equal and bilateral Secured at: 22 cm Tube secured with: Tape Dental Injury: Teeth and Oropharynx as per pre-operative assessment

## 2022-10-30 NOTE — ED Notes (Signed)
Report called to Melina Copa RN

## 2022-10-30 NOTE — Anesthesia Preprocedure Evaluation (Signed)
Anesthesia Evaluation    Airway Mallampati: IV  TM Distance: >3 FB Neck ROM: Full  Mouth opening: Limited Mouth Opening  Dental  (+) Dental Advisory Given   Pulmonary sleep apnea and Continuous Positive Airway Pressure Ventilation , former smoker   breath sounds clear to auscultation       Cardiovascular + Peripheral Vascular Disease and + DVT   Rhythm:Regular Rate:Normal  '16 ECHO: EF 55-60%. Wall motion was normal; there were no regional wall motion abnormalities. abnormal left ventricular relaxation (grade 1 diastolic dysfunction), no significant valvular abnormalities     Neuro/Psych    GI/Hepatic Neg liver ROS,,,Acute bowel perf   Endo/Other  negative endocrine ROS    Renal/GU Renal InsufficiencyRenal disease     Musculoskeletal   Abdominal   Peds  Hematology Eliquis (receiving Andexxa) Multiple myeloma: s/p stem cell transplant H/o heparin induced thrombocytopenia  Hb 12.6, plt 189k   Anesthesia Other Findings Prostate cancer  Reproductive/Obstetrics                              Anesthesia Physical Anesthesia Plan  ASA: 4 and emergent  Anesthesia Plan: General   Post-op Pain Management:    Induction: Intravenous and Rapid sequence  PONV Risk Score and Plan: 2 and Ondansetron and Dexamethasone  Airway Management Planned: Oral ETT and Video Laryngoscope Planned  Additional Equipment: None  Intra-op Plan:   Post-operative Plan: Possible Post-op intubation/ventilation  Informed Consent: I have reviewed the patients History and Physical, chart, labs and discussed the procedure including the risks, benefits and alternatives for the proposed anesthesia with the patient or authorized representative who has indicated his/her understanding and acceptance.     Dental advisory given and Consent reviewed with POA  Plan Discussed with: CRNA and Surgeon  Anesthesia Plan Comments:  (Discussed with patient and 2 daughters)         Anesthesia Quick Evaluation

## 2022-10-30 NOTE — H&P (Signed)
NAME:  Brent Allison, MRN:  [id number], DOB:  [birth year], LOS: 0 ADMISSION DATE:  11/06/2022, CONSULTATION DATE:  11/09/2022 REFERRING MD:  Dr. Alvino Chapel ED, CHIEF COMPLAINT: Abdominal pain  History of Present Illness:  76 year old with known history of below presents with acute onset of abdominal pain about 7:30 PM, peritoneal exam, found to have viscus perforation on CT of the abdomen.  Acute onset of pain.  CT scan revealed intestinal perforation.  He takes Eliquis for VTE history.  Last dose this morning.  Received Andexxa in the ED for reversal.  Surgery was consulted.  Taken to the OR.  They recommended medical ICU admission versus surgical admission given complex medical history.  Lab notable for mild elevation in creatinine, AKI, mild leukocytosis with left shift, hemoglobin appears at baseline.  Blood cultures were obtained.  Pertinent  Medical History  History of amyloidosis, history of diastolic dysfunction although euvolemic on exam, history of DVT  Significant Hospital Events: Including procedures, antibiotic start and stop dates in addition to other pertinent events   10/31/2022 presents with acute onset abdominal pain found to have abdominal perforation  Interim History / Subjective:    Objective   Blood pressure 96/65, pulse (!) 106, temperature (!) 97.5 F (36.4 C), temperature source Oral, resp. rate (!) 33, height 6' (1.829 m), weight 85.3 kg, SpO2 90 %.        Intake/Output Summary (Last 24 hours) at 10/24/2022 2310 Last data filed at 10/29/2022 2217 Gross per 24 hour  Intake 1000 ml  Output --  Net 1000 ml   Filed Weights   10/24/2022 2100  Weight: 85.3 kg    Examination: General: Elderly man writhing in bed and pain HENT: Mucous membranes, EOMI Lungs: Normal work of breathing, on room air Cardiovascular: Regular rate and rhythm, no lower extremity edema Abdomen: Severe pain with minimal palpation, hypoactive bowel sounds   Resolved Hospital  Problem list     Assessment & Plan:  Severe sepsis with abdominal perforation: Source of infection abdominal perforation, peritonitis.  Endorgan damage renal injury. -- Continue Zosyn -- Going to the OR, reassess postoperatively -- MAP goal greater than 65, 3 L of crystalloid ordered prior to surgery, vasopressors if needed -- Hold home Lasix  History of recurrent venous thrombosis: Most recently 01/2022.  On home Eliquis therapy.  Eliquis reversed with Andexxa in the ED. -- Holding Eliquis and chemoprophylaxis for DVT in the immediate postoperative state -- SCDs -- Timing of resumption of full anticoagulation to be discussed postoperatively  Abdominal pain: Related to peritonitis -- As needed Dilaudid  AKI on CKD 3 A: Related to severe sepsis -- Fluid resuscitation as above, blood pressure support as above if needed  Amyloidosis: Diagnosed in 2012.  Remote history of bone marrow transplant.  Recently on therapy being held since late August 2023 given low energy. -- Supportive care  Best Practice (right click and "Reselect all SmartList Selections" daily)   Diet/type: NPO DVT prophylaxis: SCD GI prophylaxis: N/A Lines: N/A Foley:  N/A Code Status:  full code Last date of multidisciplinary goals of care discussion [n/a]  Labs   CBC: Recent Labs  Lab 11/06/2022 2101 11/09/2022 2125  WBC 11.3*  --   NEUTROABS 7.0  --   HGB 13.2 12.6*  HCT 40.0 37.0*  MCV 99.3  --   PLT 189  --     Basic Metabolic Panel: Recent Labs  Lab 11/20/2022 2101 11/12/2022 2125  NA 139 141  K 3.5 3.5  CL 107 105  CO2 20*  --   GLUCOSE 140* 139*  BUN 23 23  CREATININE 1.75* 1.70*  CALCIUM 9.1  --    GFR: Estimated Creatinine Clearance: 39.9 mL/min (A) (by C-G formula based on SCr of 1.7 mg/dL (H)). Recent Labs  Lab 10/24/2022 2101  WBC 11.3*    Liver Function Tests: Recent Labs  Lab 11/02/2022 2101  AST 23  ALT 12  ALKPHOS 71  BILITOT 0.6  PROT 5.4*  ALBUMIN 3.3*   No results for  input(s): "LIPASE", "AMYLASE" in the last 168 hours. No results for input(s): "AMMONIA" in the last 168 hours.  ABG    Component Value Date/Time   PHART 7.454 (H) 10/30/2012 1133   PCO2ART 32.2 (L) 10/30/2012 1133   PO2ART 60.7 (L) 10/30/2012 1133   HCO3 22.2 10/30/2012 1133   TCO2 22 10/29/2022 2125   ACIDBASEDEF 0.9 10/30/2012 1133   O2SAT 93.3 10/30/2012 1133     Coagulation Profile: Recent Labs  Lab 10/27/2022 2101  INR 1.6*    Cardiac Enzymes: No results for input(s): "CKTOTAL", "CKMB", "CKMBINDEX", "TROPONINI" in the last 168 hours.  HbA1C: No results found for: "HGBA1C"  CBG: No results for input(s): "GLUCAP" in the last 168 hours.  Review of Systems:   Unable to obtain due to severe pain  Past Medical History:  He,  has a past medical history of AL amyloidosis ([place]) (11/04/2012), Arthritis, Blood dyscrasia, CKD (chronic kidney disease), DVT (deep venous thrombosis) ([place]), Dyslipidemia, H/O multiple myeloma, Heparin induced thrombocytopenia (HIT) ([place]) (11/04/2012), Joint pain, Peripheral vascular disease ([place]), Pneumonia, Prostate cancer ([place]), Sleep apnea, and Spastic quadriparesis ([place]).   Surgical History:   Past Surgical History:  Procedure Laterality Date   ANTERIOR CERVICAL DECOMP/DISCECTOMY FUSION  02/26/2012   Procedure: ANTERIOR CERVICAL DECOMPRESSION/DISCECTOMY FUSION 2 LEVELS;  Surgeon: Hosie Spangle, MD;  Location: [facility];  Service: Neurosurgery;  Laterality: N/A;  C3-4 C4-5 Anterior cervical decompression/diskectomy, fusion   ANTERIOR CERVICAL DECOMP/DISCECTOMY FUSION N/A 12/21/2021   Procedure: CERVICAL FIVE-SIX ANTERIOR CERVICAL DECOMPRESSION/DISCECTOMY FUSION;  Surgeon: Newman Pies, MD;  Location: [facility];  Service: Neurosurgery;  Laterality: N/A;  3C   BACK SURGERY     CARDIAC CATHETERIZATION  2010   CARPAL TUNNEL RELEASE  2009   left   COLONOSCOPY     HERNIA REPAIR     around 1985   ivt     IVC filter placed d/t blood clots,  removed- July- 2013   LIMBAL STEM CELL TRANSPLANT     LIPOMA EXCISION  around [place]   removed from one of his shoulders   POSTERIOR CERVICAL FUSION/FORAMINOTOMY  04/06/2012   Procedure: POSTERIOR CERVICAL FUSION/FORAMINOTOMY LEVEL 3;  Surgeon: Hosie Spangle, MD;  Location: [facility];  Service: Neurosurgery;  Laterality: N/A;  C3 and C4 laminectomy with C23 C34 C45 posterior cervical arthrodesis with instrumentation   POSTERIOR CERVICAL FUSION/FORAMINOTOMY N/A 06/02/2015   Procedure: Cervical Seven, Thoracic One, Thoracic Two Laminectomies with Cervical Seven-Thoracic One, Thoracic One-Thoracic Two Posterior Cervico-Thoracic  Arthrodesis with Instrumentation;  Surgeon: Jovita Gamma, MD;  Location: [facility];  Service: Neurosurgery;  Laterality: N/A;  C7, T1, T2 laminectomies with C7-T2 cerevico thoracic arthrodesis with instrumentation   POSTERIOR LAMINECTOMY / DECOMPRESSION LUMBAR SPINE  around 2007   L4-5   PROSTATECTOMY  around 2005   TONSILLECTOMY     as a child     Social History:   reports that he quit smoking about 43 years ago. His smoking  use included cigarettes. He has a 5.00 pack-year smoking history. He has never used smokeless tobacco. He reports current alcohol use of about 2.0 standard drinks of alcohol per week. He reports that he does not use drugs.   Family History:  His family history includes Pancreatic cancer in his mother. There is no history of Anesthesia problems, Hypotension, Malignant hyperthermia, or Pseudochol deficiency.   Allergies Allergies  Allergen Reactions   Enoxaparin Other (See Comments)    Blood clots HIT [SRA +]   Heparin Other (See Comments)    Heparin induced thrombocytopenia (SRA +)     Home Medications  Prior to Admission medications   Medication Sig Start Date End Date Taking? Authorizing Provider  apixaban (ELIQUIS) 5 MG TABS tablet Take 1 tablet (5 mg total) by mouth 2 (two) times daily. 08/20/22  Yes Ladell Pier, MD   Cyanocobalamin (VITAMIN B 12 PO) Take 1,000 mg by mouth daily.   Yes [provider]  furosemide (LASIX) 20 MG tablet Take 40 mg by mouth every other day. Extra 20 mg on Saturday 06/17/17  Yes [provider]  OVER THE COUNTER MEDICATION Take 1 tablet by mouth at bedtime as needed (As needed for insomnia).   Yes [provider]  rosuvastatin (CRESTOR) 10 MG tablet Take 10 mg by mouth daily.   Yes [provider]  spironolactone (ALDACTONE) 25 MG tablet Take 25 mg by mouth daily. 05/28/19  Yes [provider]     Critical care time:     CRITICAL CARE Performed by: Lanier Clam   Total critical care time: 40 minutes  Critical care time was exclusive of separately billable procedures and treating other patients.  Critical care was necessary to treat or prevent imminent or life-threatening deterioration.  Critical care was time spent personally by me on the following activities: development of treatment plan with patient and/or surrogate as well as nursing, discussions with consultants, evaluation of patient's response to treatment, examination of patient, obtaining history from patient or surrogate, ordering and performing treatments and interventions, ordering and review of laboratory studies, ordering and review of radiographic studies, pulse oximetry and re-evaluation of patient's condition.

## 2022-10-31 ENCOUNTER — Inpatient Hospital Stay (HOSPITAL_COMMUNITY): Payer: Medicare Other

## 2022-10-31 DIAGNOSIS — K55039 Acute (reversible) ischemia of large intestine, extent unspecified: Secondary | ICD-10-CM | POA: Diagnosis not present

## 2022-10-31 DIAGNOSIS — K631 Perforation of intestine (nontraumatic): Secondary | ICD-10-CM | POA: Diagnosis not present

## 2022-10-31 DIAGNOSIS — J9811 Atelectasis: Secondary | ICD-10-CM | POA: Diagnosis not present

## 2022-10-31 DIAGNOSIS — J9601 Acute respiratory failure with hypoxia: Secondary | ICD-10-CM

## 2022-10-31 LAB — GLUCOSE, CAPILLARY
Glucose-Capillary: 151 mg/dL — ABNORMAL HIGH (ref 70–99)
Glucose-Capillary: 206 mg/dL — ABNORMAL HIGH (ref 70–99)
Glucose-Capillary: 191 mg/dL — ABNORMAL HIGH (ref 70–99)
Glucose-Capillary: 177 mg/dL — ABNORMAL HIGH (ref 70–99)
Glucose-Capillary: 182 mg/dL — ABNORMAL HIGH (ref 70–99)
Glucose-Capillary: 214 mg/dL — ABNORMAL HIGH (ref 70–99)
Glucose-Capillary: 161 mg/dL — ABNORMAL HIGH (ref 70–99)
Glucose-Capillary: 131 mg/dL — ABNORMAL HIGH (ref 70–99)

## 2022-10-31 LAB — BASIC METABOLIC PANEL
Anion gap: 11 (ref 5–15)
BUN: 22 mg/dL (ref 8–23)
CO2: 22 mmol/L (ref 22–32)
Calcium: 8.5 mg/dL — ABNORMAL LOW (ref 8.9–10.3)
Chloride: 110 mmol/L (ref 98–111)
Creatinine, Ser: 1.79 mg/dL — ABNORMAL HIGH (ref 0.61–1.24)
GFR, Estimated: 39 mL/min — ABNORMAL LOW (ref >60)
Glucose, Bld: 237 mg/dL — ABNORMAL HIGH (ref 70–99)
Potassium: 4 mmol/L (ref 3.5–5.1)
Sodium: 143 mmol/L (ref 135–145)

## 2022-10-31 LAB — POCT I-STAT 7, (LYTES, BLD GAS, ICA,H+H)
Acid-base deficit: 9 mmol/L — ABNORMAL HIGH (ref 0.0–2.0)
Bicarbonate: 17.5 mmol/L — ABNORMAL LOW (ref 20.0–28.0)
Calcium, Ion: 1.19 mmol/L (ref 1.15–1.40)
HCT: 33 % — ABNORMAL LOW (ref 39.0–52.0)
Hemoglobin: 11.2 g/dL — ABNORMAL LOW (ref 13.0–17.0)
O2 Saturation: 99 %
Patient temperature: 97.3
Potassium: 3.6 mmol/L (ref 3.5–5.1)
Sodium: 141 mmol/L (ref 135–145)
TCO2: 19 mmol/L — ABNORMAL LOW (ref 22–32)
pCO2 arterial: 38 mmHg (ref 32–48)
pH, Arterial: 7.268 — ABNORMAL LOW (ref 7.35–7.45)
pO2, Arterial: 133 mmHg — ABNORMAL HIGH (ref 83–108)

## 2022-10-31 LAB — CBC
HCT: 37.9 % — ABNORMAL LOW (ref 39.0–52.0)
Hemoglobin: 12.6 g/dL — ABNORMAL LOW (ref 13.0–17.0)
MCH: 33.1 pg (ref 26.0–34.0)
MCHC: 33.2 g/dL (ref 30.0–36.0)
MCV: 99.5 fL (ref 80.0–100.0)
Platelets: 182 10*3/uL (ref 150–400)
RBC: 3.81 MIL/uL — ABNORMAL LOW (ref 4.22–5.81)
RDW: 13.7 % (ref 11.5–15.5)
WBC: 2.2 10*3/uL — ABNORMAL LOW (ref 4.0–10.5)
nRBC: 0 % (ref 0.0–0.2)

## 2022-10-31 LAB — LACTIC ACID, PLASMA
Lactic Acid, Venous: 4.1 mmol/L (ref 0.5–1.9)
Lactic Acid, Venous: 2.8 mmol/L (ref 0.5–1.9)

## 2022-10-31 LAB — PHOSPHORUS: Phosphorus: 3.4 mg/dL (ref 2.5–4.6)

## 2022-10-31 LAB — MRSA NEXT GEN BY PCR, NASAL: MRSA by PCR Next Gen: NOT DETECTED

## 2022-10-31 LAB — MAGNESIUM: Magnesium: 1.7 mg/dL (ref 1.7–2.4)

## 2022-10-31 LAB — HEMOGLOBIN AND HEMATOCRIT, BLOOD
HCT: 33.5 % — ABNORMAL LOW (ref 39.0–52.0)
Hemoglobin: 11 g/dL — ABNORMAL LOW (ref 13.0–17.0)

## 2022-10-31 MED ORDER — EPHEDRINE SULFATE-NACL 50-0.9 MG/10ML-% IV SOSY
PREFILLED_SYRINGE | INTRAVENOUS | Status: DC | PRN
  Administered 2022-10-31: 10 mg via INTRAVENOUS
  Administered 2022-10-31: 5 mg via INTRAVENOUS

## 2022-10-31 MED ORDER — FENTANYL CITRATE PF 50 MCG/ML IJ SOSY
25.0000 ug | PREFILLED_SYRINGE | Freq: Once | INTRAMUSCULAR | Status: AC
  Administered 2022-10-31: 25 ug via INTRAVENOUS

## 2022-10-31 MED ORDER — PHENYLEPHRINE HCL-NACL 20-0.9 MG/250ML-% IV SOLN
INTRAVENOUS | Status: DC | PRN
  Administered 2022-10-30: 50 ug/min via INTRAVENOUS

## 2022-10-31 MED ORDER — DEXTROSE IN LACTATED RINGERS 5 % IV SOLN: INTRAVENOUS | Status: DC

## 2022-10-31 MED ORDER — SODIUM CHLORIDE 0.9 % IV SOLN
250.0000 mL | INTRAVENOUS | Status: DC
  Administered 2022-10-31 – 2022-11-10 (×6): 250 mL via INTRAVENOUS

## 2022-10-31 MED ORDER — CHLORHEXIDINE GLUCONATE CLOTH 2 % EX PADS
6.0000 | MEDICATED_PAD | Freq: Every day | CUTANEOUS | Status: DC
  Administered 2022-10-31 – 2022-11-03 (×4): 6 via TOPICAL

## 2022-10-31 MED ORDER — PHENYLEPHRINE HCL-NACL 20-0.9 MG/250ML-% IV SOLN
25.0000 ug/min | INTRAVENOUS | Status: DC
  Administered 2022-10-31: 100 ug/min via INTRAVENOUS
  Administered 2022-10-31 – 2022-11-01 (×2): 25 ug/min via INTRAVENOUS
  Filled 2022-10-31 (×2): qty 250

## 2022-10-31 MED ORDER — PROPOFOL 500 MG/50ML IV EMUL
INTRAVENOUS | Status: DC | PRN
  Administered 2022-10-31: 50 ug/kg/min via INTRAVENOUS

## 2022-10-31 MED ORDER — INSULIN ASPART 100 UNIT/ML IJ SOLN
0.0000 [IU] | INTRAMUSCULAR | Status: DC
  Administered 2022-10-31 (×2): 2 [IU] via SUBCUTANEOUS
  Administered 2022-10-31: 3 [IU] via SUBCUTANEOUS
  Administered 2022-10-31 – 2022-11-01 (×6): 1 [IU] via SUBCUTANEOUS
  Administered 2022-11-02 (×2): 2 [IU] via SUBCUTANEOUS
  Administered 2022-11-02 – 2022-11-07 (×6): 1 [IU] via SUBCUTANEOUS

## 2022-10-31 MED ORDER — FENTANYL BOLUS VIA INFUSION
25.0000 ug | INTRAVENOUS | Status: DC | PRN
  Administered 2022-10-31 (×7): 100 ug via INTRAVENOUS
  Administered 2022-10-31: 50 ug via INTRAVENOUS
  Administered 2022-10-31: 25 ug via INTRAVENOUS
  Administered 2022-10-31 – 2022-11-02 (×9): 100 ug via INTRAVENOUS

## 2022-10-31 MED ORDER — NOREPINEPHRINE 4 MG/250ML-% IV SOLN: 2.0000 ug/min | INTRAVENOUS | Status: DC

## 2022-10-31 MED ORDER — MAGNESIUM SULFATE 2 GM/50ML IV SOLN
2.0000 g | Freq: Once | INTRAVENOUS | Status: AC
  Administered 2022-10-31: 2 g via INTRAVENOUS
  Filled 2022-10-31: qty 50

## 2022-10-31 MED ORDER — PHENYLEPHRINE 80 MCG/ML (10ML) SYRINGE FOR IV PUSH (FOR BLOOD PRESSURE SUPPORT)
PREFILLED_SYRINGE | INTRAVENOUS | Status: DC | PRN
  Administered 2022-10-31: 160 ug via INTRAVENOUS

## 2022-10-31 MED ORDER — PANTOPRAZOLE SODIUM 40 MG IV SOLR
40.0000 mg | INTRAVENOUS | Status: DC
  Administered 2022-10-31 – 2022-11-13 (×14): 40 mg via INTRAVENOUS
  Filled 2022-10-31 (×14): qty 10

## 2022-10-31 MED ORDER — LACTATED RINGERS IV SOLN: INTRAVENOUS | Status: DC | PRN

## 2022-10-31 MED ORDER — SODIUM CHLORIDE 0.9 % IV SOLN
250.0000 mL | INTRAVENOUS | Status: DC
  Administered 2022-10-31 (×3): 250 mL via INTRAVENOUS

## 2022-10-31 MED ORDER — FENTANYL 2500MCG IN NS 250ML (10MCG/ML) PREMIX INFUSION
25.0000 ug/h | INTRAVENOUS | Status: DC
  Administered 2022-10-31: 200 ug/h via INTRAVENOUS
  Administered 2022-10-31: 25 ug/h via INTRAVENOUS
  Administered 2022-11-01 (×2): 200 ug/h via INTRAVENOUS
  Administered 2022-11-01: 150 ug/h via INTRAVENOUS
  Filled 2022-10-31 (×5): qty 250

## 2022-10-31 MED ORDER — 0.9 % SODIUM CHLORIDE (POUR BTL) OPTIME
TOPICAL | Status: DC | PRN
  Administered 2022-10-31 (×3): 2000 mL
  Administered 2022-10-31: 1000 mL

## 2022-10-31 MED ORDER — LACTATED RINGERS IV BOLUS
1000.0000 mL | Freq: Once | INTRAVENOUS | Status: AC
  Administered 2022-10-31: 1000 mL via INTRAVENOUS

## 2022-10-31 MED ORDER — SODIUM CHLORIDE 0.9 % IV SOLN: INTRAVENOUS | Status: DC | PRN

## 2022-10-31 MED ORDER — ORAL CARE MOUTH RINSE
15.0000 mL | OROMUCOSAL | Status: DC
  Administered 2022-10-31 – 2022-11-02 (×27): 15 mL via OROMUCOSAL

## 2022-10-31 MED ORDER — ORAL CARE MOUTH RINSE: 15.0000 mL | OROMUCOSAL | Status: DC | PRN

## 2022-10-31 NOTE — Op Note (Addendum)
11/09/2022 - 10/31/2022  1:20 AM  PATIENT:  Brent Allison  76 y.o. male  PRE-OPERATIVE DIAGNOSIS:  BOWEL PERFORATION, PERITONITIS  POST-OPERATIVE DIAGNOSIS:  BOWEL PERFORATION, FECULENT PERITONITIS,  MESENTERIC ISCHEMIA  PROCEDURE:  Procedure(s): EXPLORATORY LAPAROTOMY, TRANSVERSE COLECTOMY, PLACEMENT OF ABTHERA WOUND VAC  SURGEON:  Surgeon(s): Greer Pickerel, MD  ASSISTANTS: none   ANESTHESIA:   general  DRAINS: Nasogastric Tube and Urinary Catheter (Foley)   LOCAL MEDICATIONS USED:  NONE  SPECIMEN:  Source of Specimen:  Transverse colon  DISPOSITION OF SPECIMEN:  PATHOLOGY  EBL: 200 ml  COUNTS:  YES  FINDINGS: significant stool contamination, old periumbilical mesh, omentum and colonic tissue very friable and essentially fell apart,  silk sutures marking proximal transverse & distal transverse colon  INDICATION FOR PROCEDURE: 76 year old man with a remote history of HIT/PE/SMV thrombosis and recent DVT in March 2023 on Eliquis, chronic kidney disease, amyloidosis, OSA developed severe acute abdominal pain after eating dinner this evening.  His pain was so severe EMS was called and brought him to the emergency room.  CT scan demonstrated findings of perforation of the transverse colon with stool outside of the colon, evidence of pneumatosis concerning for mesenteric ischemia in the colon.  He had peritonitis on exam.  He had taken his Eliquis this morning.  Patient was started on Andexxa, IV fluids and IV antibiotics and was brought emergently to the operating room.  Please see chart for additional details regarding risk and benefits which were discussed with the patient and one of the daughters in the ER  PROCEDURE: Patient was brought urgently to the operating room at [facility] in [place].  The patient was intubated and then transferred to the OR table.  Sequential compression devices were placed.  A Foley catheter was placed.  An orogastric tube was placed.  His  abdomen was prepped and draped in usual standard surgical fashion with ChloraPrep.  Patient had an old lower midline vertical incision from prostatectomy as well as a transverse umbilical incision from a hernia repair with mesh.  Surgical timeout was performed.  A midline incision was made from the xiphoid down to the umbilicus and went through mesh.  Patient had very thin subcutaneous layer and this fascia was very attenuated.  The abdominal cavity was entered.  There was blood within the abdominal cavity as well as large amounts of stool.  Patient had feculent peritonitis.  His tissue was very friable and essentially fell apart even with gentle manipulation.  The omentum and gastrocolic ligament was just very friable and oozy.  Identified the perforation in the mid to distal transverse colon.  Small bowel appeared grossly normal.  I debrided some of the nonviable omentum with LigaSure.  I made a small window in the transverse colon mesentery a few inches proximal to the area of perforation because this segment of the colon also appeared very suspect and nonviable.  The colon was divided with a GIA stapler 75 with a blue load.  I then took down the mesentery staying close to the transverse colon down toward the area of perforation and we went slightly past.  I then transected that part of the colon just distal to the perforation with another fire of the GIA stapler with a 75.  The descending colon and sigmoid colon appeared viable but not overtly healthy.  There is no frank ischemia to it.  It was technically challenging to completely identify and inspect the splenic flexure.  It was encased in  fat and every time I gently manipulated that area the area just started using.  I reinspected the distal colon staple line and felt that we need to resect an additional 2 inches just because it did not appear viable.  Another window was made in the mesentery and then not stapled across that 2 inch segment of transverse colon  with another fire of the GIA 75 stapler with a blue load.  We then irrigated the abdomen with 6 L of saline.  I felt he needed a second look to reinspect the remaining distal transverse colon and splenic flexure.  The proximal descending colon appeared viable but again also did not look as healthy as the small bowel per se.  But I did not think it needed to be resected this evening.  An ABThera wound VAC was obtained and placed per protocol.  Plastic drape was placed over the sponge and the wound VAC sponge was connected to the wound VAC machine 125 mmHg of continuous pressure.  He had no air leak.   I had tacked each of the staple lines with a 3-0 silk suture with a long tail for future identification.  All needle, instrument sponge counts were correct x 2.  The patient was left intubated since his abdomen was open.  The plan will be to bring him back on Monday or Tuesday for second look.  I updated his daughters at the end of the procedure and discussed the findings with critical care medicine  CASE DATA:  Type of patient?: DOW CASE (Surgical Hospitalist [facility] Inpatient)  Status of Case? EMERGENT Add On  Infection Present At Time Of Surgery (PATOS)?  FECULENT PERITONITIS      PLAN OF CARE: Admit to inpatient   PATIENT DISPOSITION:  ICU - intubated and critically ill.   Delay start of Pharmacological VTE agent (>24hrs) due to surgical blood loss or risk of bleeding:  yes  Leighton Ruff. Redmond Pulling, MD, FACS General, Bariatric, & Minimally Invasive Surgery [facility], [place]

## 2022-10-31 NOTE — Progress Notes (Signed)
  Echocardiogram 2D Echocardiogram has been performed.  Brent Allison 10/31/2022, 5:04 PM

## 2022-10-31 NOTE — Progress Notes (Signed)
An USGPIV (ultrasound guided PIV) has been placed for short-term vasopressor infusion. A correctly placed ivWatch must be used when administering Vasopressors. Should this treatment be needed beyond 72 hours, central line access should be obtained.  It will be the responsibility of the bedside nurse to follow best practice to prevent extravasations.   ?

## 2022-10-31 NOTE — Transfer of Care (Signed)
Immediate Anesthesia Transfer of Care Note  Patient: Brent Allison  Procedure(s) Performed: EXPLORATORY LAPAROTOMY, TRANSVERSE COLECTOMY, PLACEMENT OF ABTHERA WOUND VAC  Patient Location: PACU  Anesthesia Type:General  Level of Consciousness: sedated and Patient remains intubated per anesthesia plan  Airway & Oxygen Therapy: Patient remains intubated per anesthesia plan and Patient placed on Ventilator (see vital sign flow sheet for setting)  Post-op Assessment: Report given to RN and Post -op Vital signs reviewed and stable  Post vital signs: Reviewed and stable  Last Vitals:  Vitals Value Taken Time  BP 99/56 10/31/22 0115  Temp    Pulse 114 10/31/22 0117  Resp 15 10/31/22 0117  SpO2 99 % 10/31/22 0117  Vitals shown include unvalidated device data.  Last Pain:  Vitals:   11/16/2022 2135  TempSrc:   PainSc: 10-Worst pain ever      Patients Stated Pain Goal: 0 (59/93/57 0177)  Complications: No notable events documented.

## 2022-10-31 NOTE — Progress Notes (Signed)
1 Day Post-Op  Subjective: CC: Surgery completed earlier this morning by Dr. Redmond Pulling. Abdomen open. On vent. Off pressors. Making urine. Opens eyes to voice.   Objective: Vital signs in last 24 hours: Temp:  [97 F (36.1 C)-97.9 F (36.6 C)] 97.9 F (36.6 C) (12/10 0715) Pulse Rate:  [59-134] 113 (12/10 0715) Resp:  [15-33] 16 (12/10 0715) BP: (92-162)/(49-91) 121/73 (12/10 0715) SpO2:  [79 %-100 %] 100 % (12/10 0715) FiO2 (%):  [50 %-60 %] 50 % (12/10 0300) Weight:  [85.3 kg-89.2 kg] 89.2 kg (12/10 0100)    Intake/Output from previous day: 12/09 0701 - 12/10 0700 In: 3550 [I.V.:1800; IV Piggyback:1750] Out: 950 [Urine:450; Blood:100] Intake/Output this shift: No intake/output data recorded.  PE: Gen:  On vent, opens eyes to voice.  Abd: Soft, no rigidity or guarding, abthera vac in place with thin SS like fluid in canister with good seal.    Lab Results:  Recent Labs    10/24/2022 2101 11/19/2022 2125 10/31/22 0236 10/31/22 0652  WBC 11.3*  --   --  2.2*  HGB 13.2   < > 11.2* 12.6*  HCT 40.0   < > 33.0* 37.9*  PLT 189  --   --  182   < > = values in this interval not displayed.   BMET Recent Labs    10/29/2022 2101 11/17/2022 2125 10/31/22 0236 10/31/22 0652  NA 139 141 141 143  K 3.5 3.5 3.6 4.0  CL 107 105  --  110  CO2 20*  --   --  22  GLUCOSE 140* 139*  --  237*  BUN 23 23  --  22  CREATININE 1.75* 1.70*  --  1.79*  CALCIUM 9.1  --   --  8.5*   PT/INR Recent Labs    10/28/2022 2101  LABPROT 19.3*  INR 1.6*   CMP     Component Value Date/Time   NA 143 10/31/2022 0652   NA 140 01/29/2016 0831   K 4.0 10/31/2022 0652   K 4.4 01/29/2016 0831   CL 110 10/31/2022 0652   CL 106 03/14/2013 1304   CO2 22 10/31/2022 0652   CO2 24 01/29/2016 0831   GLUCOSE 237 (H) 10/31/2022 0652   GLUCOSE 105 01/29/2016 0831   GLUCOSE 94 03/14/2013 1304   BUN 22 10/31/2022 0652   BUN 21.5 01/29/2016 0831   CREATININE 1.79 (H) 10/31/2022 0652   CREATININE 1.54  (H) 10/21/2022 1433   CREATININE 1.3 01/29/2016 0831   CALCIUM 8.5 (L) 10/31/2022 0652   CALCIUM 9.1 01/29/2016 0831   PROT 5.4 (L) 11/02/2022 2101   PROT 6.1 01/29/2016 0831   PROT 6.5 01/29/2016 0831   ALBUMIN 3.3 (L) 11/15/2022 2101   ALBUMIN 3.5 01/29/2016 0831   AST 23 10/22/2022 2101   AST 13 (L) 10/21/2022 1433   AST 16 01/29/2016 0831   ALT 12 10/23/2022 2101   ALT 9 10/21/2022 1433   ALT 10 01/29/2016 0831   ALKPHOS 71 10/31/2022 2101   ALKPHOS 92 01/29/2016 0831   BILITOT 0.6 11/05/2022 2101   BILITOT 0.4 10/21/2022 1433   BILITOT 0.41 01/29/2016 0831   GFRNONAA 39 (L) 10/31/2022 0652   GFRNONAA 46 (L) 10/21/2022 1433   GFRAA >60 04/24/2019 0906   Lipase     Component Value Date/Time   LIPASE 10 (L) 10/28/2012 2253    Studies/Results: DG CHEST PORT 1 VIEW  Result Date: 10/31/2022 CLINICAL DATA:  Endotracheal tube EXAM: PORTABLE  CHEST 1 VIEW COMPARISON:  10/05/2022 FINDINGS: Endotracheal tube with tip just below the clavicular heads. The enteric tube at least reaches the stomach. Artifact from EKG leads. Worsening opacity at the lung bases with hazy appearance. No edema, effusion, or pneumothorax. Normal heart size. IMPRESSION: 1. Unremarkable hardware. 2. Worsening aeration at the bases with hazy appearance favoring atelectasis. Electronically Signed   By: Jorje Guild M.D.   On: 10/31/2022 05:40   CT Angio Abd/Pel W and/or Wo Contrast  Result Date: 10/23/2022 CLINICAL DATA:  Acute mesenteric ischemia. EXAM: CTA ABDOMEN AND PELVIS WITHOUT AND WITH CONTRAST TECHNIQUE: Multidetector CT imaging of the abdomen and pelvis was performed using the standard protocol during bolus administration of intravenous contrast. Multiplanar reconstructed images and MIPs were obtained and reviewed to evaluate the vascular anatomy. RADIATION DOSE REDUCTION: This exam was performed according to the departmental dose-optimization program which includes automated exposure control,  adjustment of the mA and/or kV according to patient size and/or use of iterative reconstruction technique. CONTRAST:  67m OMNIPAQUE IOHEXOL 350 MG/ML SOLN COMPARISON:  10/29/2012. FINDINGS: VASCULAR Aorta: Aortic atherosclerosis. Normal caliber aorta without aneurysm, dissection, vasculitis or significant stenosis. Celiac: Patent without evidence of aneurysm, dissection, vasculitis or significant stenosis. SMA: Patent without evidence of aneurysm, dissection, vasculitis or significant stenosis. Renals: Both renal arteries are patent without evidence of aneurysm, dissection, vasculitis, fibromuscular dysplasia or significant stenosis. An accessory renal artery is noted on the right. IMA: Patent. Inflow: Patent without evidence of aneurysm, dissection, vasculitis or significant stenosis. Proximal Outflow: Bilateral common femoral and visualized portions of the superficial and profunda femoral arteries are patent without evidence of aneurysm, dissection, vasculitis or significant stenosis. Veins: No portal venous gas. Review of the MIP images confirms the above findings. NON-VASCULAR Lower chest: Heart is enlarged and there is a small pericardial effusion. There are trace bilateral pleural effusions with atelectasis at the lung bases. Hepatobiliary: No focal liver abnormality is seen. Stones are present within the gallbladder. No biliary ductal dilatation. Pancreas: Unremarkable. No pancreatic ductal dilatation or surrounding inflammatory changes. Spleen: Normal size. A vague hypodensity is present in the liver, most likely cyst or hemangioma. Adrenals/Urinary Tract: No adrenal nodule or mass. The kidneys enhance symmetrically. No renal calculus or hydronephrosis. Bladder is unremarkable. Stomach/Bowel: The stomach is distended with fluid and ingested debris. No bowel obstruction. Multiple foci of free air are noted in the upper abdomen and most pronounced in the mid left abdomen. Multiple scattered diverticula are  present along the colon without evidence of diverticulitis. A moderate amount of retained stool is present in the colon. Mild colonic wall thickening involving the mid to distal transverse colon with suspected pneumatosis. Fat stranding is noted in the left upper quadrant. There is a focal defect in the wall of the mid transverse colon, axial images 38 and 39, with perforation and focal collection of debris and air measuring 9.9 x 3.5 cm. Appendix is not seen. Lymphatic: No abdominal or pelvic lymphadenopathy. Reproductive: Prostate is unremarkable. Other: No abdominopelvic ascites. Musculoskeletal: Spinal fusion hardware is present L2-L5. Degenerative changes in the thoracolumbar spine. No acute or suspicious osseous abnormality. IMPRESSION: VASCULAR Aortic atherosclerosis without evidence of aneurysm. The aorta and large branch vessels are patent. NON-VASCULAR 1. Mild colonic wall thickening involving the mid to distal transverse colon with suspected pneumatosis. No portal venous gas is seen, however findings remain concerning for mesenteric ischemia. There is a focal perforation in the distal transverse colon in the left upper quadrant with leakage of stool and free  air in the abdomen, best visualized on axial images 38 and 39. 2. Trace bilateral pleural effusions with atelectasis at the lung bases. 3. Cholelithiasis. 4. Diverticulosis without diverticulitis. 5. Remaining incidental findings as described above. Critical Value/emergent results were called by telephone at the time of interpretation on 10/22/2022 at 9:39 pm to provider Davonna Belling , who verbally acknowledged these results. Electronically Signed   By: Brett Fairy M.D.   On: 11/05/2022 22:12    Anti-infectives: Anti-infectives (From admission, onward)    Start     Dose/Rate Route Frequency Ordered Stop   10/31/22 0400  piperacillin-tazobactam (ZOSYN) IVPB 3.375 g        3.375 g 12.5 mL/hr over 240 Minutes Intravenous Every 8 hours  10/29/2022 2330     11/11/2022 2130  piperacillin-tazobactam (ZOSYN) IVPB 3.375 g        3.375 g 100 mL/hr over 30 Minutes Intravenous  Once 11/07/2022 2120 11/07/2022 2221        Assessment/Plan POD 0 s/p Ex lap, transverse colectomy and placement of abthera wound vac for bowel perforation, feculent peritonitis, mesenteric ischemia by Dr. Redmond Pulling on 10/31/22 - Intra-Op Findings: significant stool contamination, old periumbilical mesh, omentum and colonic tissue very friable and essentially fell apart, silk sutures marking proximal transverse & distal transverse colon  - Please keep Abthera Vac in place, DO NOT REMOVE OR CHANGE - Cont abx - Appreciate CCM's assistance with vent and pressor support. Please leave on vent - we plan to return to the OR Monday vs Tuesday  FEN - Strict NPO, DO NOT START TF's/ENTERAL NUTRTION, Cont OGT to LIWS VTE - SCDs, On Eliquis at baseline. Andexxa pre-op. Trend hgb - 12.6 this am. Will discuss with MD if okay for heparin gtt today and hold at midnight.  ID - Zosyn. His wbc is 2.2 this am.  Foley - In place, monitor I/O.  Plan -   - Per CCM -  AKI on CKD3A Amyloidosis  History of recurrent DVT: Most recently 01/2022   LOS: 1 day    [name] , [facility] 10/31/2022, 7:44 AM Please see Amion for pager number during day hours 7:00am-4:30pm

## 2022-10-31 NOTE — Progress Notes (Addendum)
eLink Physician-Brief Progress Note Patient Name: Brent Allison DOB: 1946/08/18 MRN: [id number]   Date of Service  10/31/2022  HPI/Events of Note  76/M who presents with abdominal pain. Evaluation showed perforated viscus. He underwent emergent exploratory laparotomy. H ewas found to have blood and large amount of stool in the abdominal cavity. Tissue described as friable. Perforation identified in the mid-distal trasverse colon. Colon resection was performed. Remaining distal transverse colon appeared to be at risk, hence pt planned for second look procedure. Abthera wound vac was placed in the meantime.  He was transferred to the ICU post op. He remains intubated, sedated. He was noted to be hypotensive and started on pressor support.   eICU Interventions  Septic shock secondary to peritonitis - Started on empiric antibiotics. Will follow cultures and adjust as warranted - Had been started on peripheral phenylephrine in the OR. Will continue for now. If with worsening hypotension will plan to start levophed, vasopressin as well.  - Continue IVF. Monitor I/Os closely - Trend WBC, lactate, temperature curve.  - Serial abdominal exam, will monitor drain output - Follow serial H/H as well. Monitor for bleeding.  - Follow further surgical plans.   2. Acute respiratory failure - Maintain on low tidal volume ventilation strategy (4-16m/kg PBW). Target plateau pressures <30 - Downtitirate FiO2 and PEEP to maintain SpO2 >90% - Serial ABG, will make further vent adjustments as warranted.  - Had been on propofol post op, will add fentanyl drip for additional pain control sedation - Plan for daily sedation holiday to facilitate evaluation of mental status.         [place]12/08/2022, 1:27 AM

## 2022-10-31 NOTE — H&P (Signed)
NAME:  Brent Allison, MRN:  [id number], DOB:  1946-10-09, LOS: 1 ADMISSION DATE:  10/23/2022, CONSULTATION DATE:  10/31/22 REFERRING MD:  Dr. Alvino Chapel ED, CHIEF COMPLAINT: Abdominal pain  History of Present Illness:  76 year old with known history of below presents with acute onset of abdominal pain about 7:30 PM, peritoneal exam, found to have viscus perforation on CT of the abdomen.  Acute onset of pain.  CT scan revealed intestinal perforation.  He takes Eliquis for VTE history.  Last dose this morning.  Received Andexxa in the ED for reversal.  Surgery was consulted.  Taken to the OR.  They recommended medical ICU admission versus surgical admission given complex medical history.  Lab notable for mild elevation in creatinine, AKI, mild leukocytosis with left shift, hemoglobin appears at baseline.  Blood cultures were obtained.  Pertinent  Medical History  History of amyloidosis, history of diastolic dysfunction although euvolemic on exam, history of DVT  Significant Hospital Events: Including procedures, antibiotic start and stop dates in addition to other pertinent events   11/07/2022 presents with acute onset abdominal pain found to have abdominal perforation 10/31/22  - Surgery : POD 0 s/p Ex lap, transverse colectomy and placement of abthera wound vac for bowel perforation, feculent peritonitis, mesenteric ischemia by Dr. Redmond Pulling on 10/31/22. - Intra-Op Findings: significant stool contamination, old periumbilical mesh, omentum and colonic tissue very friable and essentially fell apart, silk sutures marking proximal transverse & distal transverse colon ./.  On Eliquis at baseline. Andexxa pre-op. T   Interim History / Subjective:    12/10 -on the ventilator FiO2 30%.  On fentanyl infusion.  Comfortable but any slightest movement produces significant abdominal pain.  Has wound VAC that is draining.  Not on pressors.  Afebrile.  White count is extremely low.  On Zosyn.  Objective    Blood pressure 121/73, pulse (!) 113, temperature 97.9 F (36.6 C), temperature source Oral, resp. rate 16, height 6' (1.829 m), weight 89.2 kg, SpO2 100 %.    Vent Mode: PRVC FiO2 (%):  [30 %-60 %] 30 % Set Rate:  [15 bmp-20 bmp] 20 bmp Vt Set:  [620 mL] 620 mL PEEP:  [5 cmH20] 5 cmH20 Plateau Pressure:  [14 cmH20-15 cmH20] 15 cmH20   Intake/Output Summary (Last 24 hours) at 10/31/2022 0837 Last data filed at 10/31/2022 0500 Gross per 24 hour  Intake 3550 ml  Output 950 ml  Net 2600 ml   Filed Weights   11/12/2022 2100 10/31/22 0100  Weight: 85.3 kg 89.2 kg    Examination: General Appearance:  Looks criticall ill. Ventilator + Head:  Normocephalic, without obvious abnormality, atraumatic Eyes:  PERRL - yes, conjunctiva/corneas - muddy     Ears:  Normal external ear canals, both ears Nose:  G tube - no Throat:  ETT TUBE - yes , OG tube - yes Neck:  Supple,  No enlargement/tenderness/nodules Lungs: Clear to auscultation bilaterally, Ventilator   Synchrony - yes 30% Heart:  S1 and S2 normal, no murmur, CVP - no.  Pressors - no Abdomen:  Soft, no masses, no organomegaly. HAS WOUND VAC + . NO BLEEDING Genitalia / Rectal:  Not done Extremities:  Extremities- intac Skin:  ntact in exposed areas . Sacral area - not examined Neurologic:  Sedation - fent gtt -> RASS - -2/-3 . Moves all 4s - yes. CAM-ICU - x . Orientation - able t follow comand sand nod approprately        Resolved Hospital Problem list  Assessment & Plan:  Severe sepsis with abdominal perforation: Source of infection abdominal perforation, peritonitis.  Endorgan damage renal injury.  12/10 - not on pressors but BP soft  PLAN -- Continue Zosyn 11/03/2022  - repeat fluids bolus -- MAP goal greater than 65, -- Hold home Lasix  History of recurrent venous thrombosis: Most recently 01/2022.  On home Eliquis therapy.  Eliquis reversed with Andexxa in the ED.  12/10 - no evidence of DVT/PE  PLAN --  Holding Eliquis and chemoprophylaxis for DVT in the immediate postoperative state -- SCDs -- Timing of resumption of full anticoagulation to be discussed postoperatively  Abdominal pain: Related to peritonitis and pos op - s/p P-lao  PLAN -- As needed Dilaudid - prn fent   -fetnt gtt   P-lap emergent 10/31/22  Plan - per cCS as below  Please keep Abthera Vac in place, DO NOT REMOVE OR CHANGE - Cont abx - . Please leave on vent - we plan to return to the OR 12/11 or 12/12  Acute resp failure - intra-op due to sepsis  12/10 - 30% fio2 but cannot extubate due to sepsis , pain and repeat OR needs  PLAN PRVC No extubation  CKD - baseline 1.'5mg'$ % AKI on CKD 3 A: Related to severe sepsis  12/10 - no change in AKI creat 1.'7mg'$ % PLAn -- Fluid resuscitation as above, blood pressure support as above if needed  Lactic acidosis - POA  12/10 worse  Plan  - recjheck after fluid bolus  Hx of LVH (severe); last echo 2016 QTc prolonged 12//9/23 - 497 msec  Plan  = recheck EKG - check echo - check trop   Anemia - post op. No active bleed  PLan  - - PRBC for hgb </= 6.9gm%    - exceptions are   -  if ACS susepcted/confirmed then transfuse for hgb </= 8.0gm%,  or    -  active bleeding with hemodynamic instability, then transfuse regardless of hemoglobin value   At at all times try to transfuse 1 unit prbc as possible with exception of active hemorrhage   Sevefe leukkopenia 10/31/22   Plan  - mnitor  Mild low mag 10/31/22  Plan  - repelted   At risk hypo and hyperglycemia  Plan  - d5 LR  - ssi  Amyloidosis: Diagnosed in 2012.  Remote history of bone marrow transplant.  Recently on therapy being held since late August 2023 given low energy.  Plan -- Supportive care  Best Practice (right click and "Reselect all SmartList Selections" daily)   Diet/type: NPO (NOT) DVT prophylaxis: SCD - CCS to inform when to start [facility] GI prophylaxis:  PPI 10/31/22 Lines: N/A  - no cvl Foley:  12/10 -  12/10 dc Code Status:  full code Last date of multidisciplinary goals of care discussion [n/a]   10/31/22 - AManda Clift - called and updated   [place]   The patient Brent Allison is critically ill with multiple organ systems failure and requires high complexity decision making for assessment and support, frequent evaluation and titration of therapies, application of advanced monitoring technologies and extensive interpretation of multiple databases and discussion with other appropriate health care personnel such as bedside nurses, social workers, case Freight forwarder, consultants, respiratory therapists, nutritionists, secretaries etc.,  Critical care time includes but is not restricted to just documentation time. Documentation can happen in parallel or sequential to care time depending on case mix urgency and priorities for the shift. So,  overall critical Care Time devoted to patient care services described in this note is  30  Minutes.   This time reflects time of care of this signee Dr Brand Males which includ does not reflect procedure time, or teaching time or supervisory time of PA/NP/Med student/Med Resident etc but could involve care discussion time     Dr. Brand Males, M.D., [facility].C.P Pulmonary and Critical Care Medicine Medical Director - [facility] ICU Staff Physician, [place] [facility] Pager: [phone], If no answer or between  15:00h - 7:00h: call [phone]  10/31/2022 9:08 AM    LABS    PULMONARY Recent Labs  Lab 11/05/2022 2125 10/31/22 0236  PHART  --  7.268*  PCO2ART  --  38.0  PO2ART  --  133*  HCO3  --  17.5*  TCO2 22 19*  O2SAT  --  99    CBC Recent Labs  Lab 11/05/2022 2101 11/11/2022 2125 10/31/22 0236 10/31/22 0652  HGB 13.2 12.6* 11.2* 12.6*  HCT 40.0 37.0* 33.0* 37.9*  WBC 11.3*  --   --  2.2*  PLT 189  --   --  182     COAGULATION Recent Labs  Lab 11/11/2022 2101  INR 1.6*    CARDIAC  No results for input(s): "TROPONINI" in the last 168 hours. No results for input(s): "PROBNP" in the last 168 hours.   CHEMISTRY Recent Labs  Lab 10/26/2022 2101 11/09/2022 2125 10/31/22 0236 10/31/22 0652  NA 139 141 141 143  K 3.5 3.5 3.6 4.0  CL 107 105  --  110  CO2 20*  --   --  22  GLUCOSE 140* 139*  --  237*  BUN 23 23  --  22  CREATININE 1.75* 1.70*  --  1.79*  CALCIUM 9.1  --   --  8.5*  MG  --   --   --  1.7  PHOS  --   --   --  3.4   Estimated Creatinine Clearance: 37.9 mL/min (A) (by C-G formula based on SCr of 1.79 mg/dL (H)).   LIVER Recent Labs  Lab 10/29/2022 2101  AST 23  ALT 12  ALKPHOS 71  BILITOT 0.6  PROT 5.4*  ALBUMIN 3.3*  INR 1.6*     INFECTIOUS Recent Labs  Lab 11/07/2022 2100 10/31/22 0652  LATICACIDVEN 2.3* 4.1*     ENDOCRINE CBG (last 3)  Recent Labs    10/31/22 0103 10/31/22 0323 10/31/22 0733  GLUCAP 151* 206* 191*         IMAGING x48h  - image(s) personally visualized  -   highlighted in bold DG CHEST PORT 1 VIEW  Result Date: 10/31/2022 CLINICAL DATA:  Endotracheal tube EXAM: PORTABLE CHEST 1 VIEW COMPARISON:  10/05/2022 FINDINGS: Endotracheal tube with tip just below the clavicular heads. The enteric tube at least reaches the stomach. Artifact from EKG leads. Worsening opacity at the lung bases with hazy appearance. No edema, effusion, or pneumothorax. Normal heart size. IMPRESSION: 1. Unremarkable hardware. 2. Worsening aeration at the bases with hazy appearance favoring atelectasis. Electronically Signed   By: Jorje Guild M.D.   On: 10/31/2022 05:40   CT Angio Abd/Pel W and/or Wo Contrast  Result Date: 11/16/2022 CLINICAL DATA:  Acute mesenteric ischemia. EXAM: CTA ABDOMEN AND PELVIS WITHOUT AND WITH CONTRAST TECHNIQUE: Multidetector CT imaging of the abdomen and pelvis was performed using the standard protocol during bolus administration  of  intravenous contrast. Multiplanar reconstructed images and MIPs were obtained and reviewed to evaluate the vascular anatomy. RADIATION DOSE REDUCTION: This exam was performed according to the departmental dose-optimization program which includes automated exposure control, adjustment of the mA and/or kV according to patient size and/or use of iterative reconstruction technique. CONTRAST:  19m OMNIPAQUE IOHEXOL 350 MG/ML SOLN COMPARISON:  10/29/2012. FINDINGS: VASCULAR Aorta: Aortic atherosclerosis. Normal caliber aorta without aneurysm, dissection, vasculitis or significant stenosis. Celiac: Patent without evidence of aneurysm, dissection, vasculitis or significant stenosis. SMA: Patent without evidence of aneurysm, dissection, vasculitis or significant stenosis. Renals: Both renal arteries are patent without evidence of aneurysm, dissection, vasculitis, fibromuscular dysplasia or significant stenosis. An accessory renal artery is noted on the right. IMA: Patent. Inflow: Patent without evidence of aneurysm, dissection, vasculitis or significant stenosis. Proximal Outflow: Bilateral common femoral and visualized portions of the superficial and profunda femoral arteries are patent without evidence of aneurysm, dissection, vasculitis or significant stenosis. Veins: No portal venous gas. Review of the MIP images confirms the above findings. NON-VASCULAR Lower chest: Heart is enlarged and there is a small pericardial effusion. There are trace bilateral pleural effusions with atelectasis at the lung bases. Hepatobiliary: No focal liver abnormality is seen. Stones are present within the gallbladder. No biliary ductal dilatation. Pancreas: Unremarkable. No pancreatic ductal dilatation or surrounding inflammatory changes. Spleen: Normal size. A vague hypodensity is present in the liver, most likely cyst or hemangioma. Adrenals/Urinary Tract: No adrenal nodule or mass. The kidneys enhance symmetrically. No renal calculus  or hydronephrosis. Bladder is unremarkable. Stomach/Bowel: The stomach is distended with fluid and ingested debris. No bowel obstruction. Multiple foci of free air are noted in the upper abdomen and most pronounced in the mid left abdomen. Multiple scattered diverticula are present along the colon without evidence of diverticulitis. A moderate amount of retained stool is present in the colon. Mild colonic wall thickening involving the mid to distal transverse colon with suspected pneumatosis. Fat stranding is noted in the left upper quadrant. There is a focal defect in the wall of the mid transverse colon, axial images 38 and 39, with perforation and focal collection of debris and air measuring 9.9 x 3.5 cm. Appendix is not seen. Lymphatic: No abdominal or pelvic lymphadenopathy. Reproductive: Prostate is unremarkable. Other: No abdominopelvic ascites. Musculoskeletal: Spinal fusion hardware is present L2-L5. Degenerative changes in the thoracolumbar spine. No acute or suspicious osseous abnormality. IMPRESSION: VASCULAR Aortic atherosclerosis without evidence of aneurysm. The aorta and large branch vessels are patent. NON-VASCULAR 1. Mild colonic wall thickening involving the mid to distal transverse colon with suspected pneumatosis. No portal venous gas is seen, however findings remain concerning for mesenteric ischemia. There is a focal perforation in the distal transverse colon in the left upper quadrant with leakage of stool and free air in the abdomen, best visualized on axial images 38 and 39. 2. Trace bilateral pleural effusions with atelectasis at the lung bases. 3. Cholelithiasis. 4. Diverticulosis without diverticulitis. 5. Remaining incidental findings as described above. Critical Value/emergent results were called by telephone at the time of interpretation on 11/12/2022 at 9:39 pm to provider NDavonna Belling, who verbally acknowledged these results. Electronically Signed   By: LBrett FairyM.D.   On:  11/16/2022 22:12

## 2022-11-01 ENCOUNTER — Inpatient Hospital Stay (HOSPITAL_COMMUNITY): Payer: Medicare Other | Admitting: Anesthesiology

## 2022-11-01 ENCOUNTER — Encounter (HOSPITAL_COMMUNITY): Payer: Self-pay | Admitting: General Surgery

## 2022-11-01 ENCOUNTER — Encounter (HOSPITAL_COMMUNITY): Admission: EM | Disposition: E | Payer: Self-pay | Source: Home / Self Care | Attending: Pulmonary Disease

## 2022-11-01 ENCOUNTER — Other Ambulatory Visit: Payer: Self-pay

## 2022-11-01 DIAGNOSIS — K631 Perforation of intestine (nontraumatic): Secondary | ICD-10-CM | POA: Diagnosis not present

## 2022-11-01 DIAGNOSIS — I739 Peripheral vascular disease, unspecified: Secondary | ICD-10-CM | POA: Diagnosis not present

## 2022-11-01 DIAGNOSIS — A419 Sepsis, unspecified organism: Secondary | ICD-10-CM

## 2022-11-01 DIAGNOSIS — K668 Other specified disorders of peritoneum: Secondary | ICD-10-CM | POA: Diagnosis not present

## 2022-11-01 DIAGNOSIS — Z9049 Acquired absence of other specified parts of digestive tract: Secondary | ICD-10-CM

## 2022-11-01 DIAGNOSIS — R198 Other specified symptoms and signs involving the digestive system and abdomen: Secondary | ICD-10-CM | POA: Diagnosis not present

## 2022-11-01 DIAGNOSIS — R6521 Severe sepsis with septic shock: Secondary | ICD-10-CM

## 2022-11-01 DIAGNOSIS — M1990 Unspecified osteoarthritis, unspecified site: Secondary | ICD-10-CM

## 2022-11-01 DIAGNOSIS — E440 Moderate protein-calorie malnutrition: Secondary | ICD-10-CM | POA: Diagnosis not present

## 2022-11-01 HISTORY — PX: COLOSTOMY: SHX63

## 2022-11-01 HISTORY — PX: LAPAROTOMY: SHX154

## 2022-11-01 LAB — CBC
HCT: 32.5 % — ABNORMAL LOW (ref 39.0–52.0)
Hemoglobin: 10.5 g/dL — ABNORMAL LOW (ref 13.0–17.0)
MCH: 32.3 pg (ref 26.0–34.0)
MCHC: 32.3 g/dL (ref 30.0–36.0)
MCV: 100 fL (ref 80.0–100.0)
Platelets: 143 10*3/uL — ABNORMAL LOW (ref 150–400)
RBC: 3.25 MIL/uL — ABNORMAL LOW (ref 4.22–5.81)
RDW: 14.2 % (ref 11.5–15.5)
WBC: 15.3 10*3/uL — ABNORMAL HIGH (ref 4.0–10.5)
nRBC: 0 % (ref 0.0–0.2)

## 2022-11-01 LAB — TROPONIN I (HIGH SENSITIVITY)
Troponin I (High Sensitivity): 547 ng/L (ref <18)
Troponin I (High Sensitivity): 469 ng/L (ref <18)
Troponin I (High Sensitivity): 453 ng/L (ref <18)

## 2022-11-01 LAB — COMPREHENSIVE METABOLIC PANEL
ALT: 11 U/L (ref 0–44)
AST: 19 U/L (ref 15–41)
Albumin: 2.4 g/dL — ABNORMAL LOW (ref 3.5–5.0)
Alkaline Phosphatase: 34 U/L — ABNORMAL LOW (ref 38–126)
Anion gap: 9 (ref 5–15)
BUN: 29 mg/dL — ABNORMAL HIGH (ref 8–23)
CO2: 19 mmol/L — ABNORMAL LOW (ref 22–32)
Calcium: 8.3 mg/dL — ABNORMAL LOW (ref 8.9–10.3)
Chloride: 113 mmol/L — ABNORMAL HIGH (ref 98–111)
Creatinine, Ser: 1.84 mg/dL — ABNORMAL HIGH (ref 0.61–1.24)
GFR, Estimated: 37 mL/min — ABNORMAL LOW (ref >60)
Glucose, Bld: 144 mg/dL — ABNORMAL HIGH (ref 70–99)
Potassium: 5.2 mmol/L — ABNORMAL HIGH (ref 3.5–5.1)
Sodium: 141 mmol/L (ref 135–145)
Total Bilirubin: 0.7 mg/dL (ref 0.3–1.2)
Total Protein: 4.5 g/dL — ABNORMAL LOW (ref 6.5–8.1)

## 2022-11-01 LAB — SURGICAL PCR SCREEN
MRSA, PCR: NEGATIVE
Staphylococcus aureus: NEGATIVE

## 2022-11-01 LAB — GLUCOSE, CAPILLARY
Glucose-Capillary: 126 mg/dL — ABNORMAL HIGH (ref 70–99)
Glucose-Capillary: 127 mg/dL — ABNORMAL HIGH (ref 70–99)
Glucose-Capillary: 133 mg/dL — ABNORMAL HIGH (ref 70–99)
Glucose-Capillary: 121 mg/dL — ABNORMAL HIGH (ref 70–99)
Glucose-Capillary: 130 mg/dL — ABNORMAL HIGH (ref 70–99)
Glucose-Capillary: 148 mg/dL — ABNORMAL HIGH (ref 70–99)

## 2022-11-01 LAB — HEMOGLOBIN A1C
Hgb A1c MFr Bld: 5.7 % — ABNORMAL HIGH (ref 4.8–5.6)
Mean Plasma Glucose: 117 mg/dL

## 2022-11-01 LAB — LACTIC ACID, PLASMA: Lactic Acid, Venous: 2.4 mmol/L (ref 0.5–1.9)

## 2022-11-01 LAB — MAGNESIUM: Magnesium: 1.8 mg/dL (ref 1.7–2.4)

## 2022-11-01 LAB — PHOSPHORUS: Phosphorus: 3.4 mg/dL (ref 2.5–4.6)

## 2022-11-01 SURGERY — LAPAROTOMY, EXPLORATORY
Anesthesia: General | Site: Abdomen | Laterality: Right

## 2022-11-01 MED ORDER — DEXAMETHASONE SODIUM PHOSPHATE 10 MG/ML IJ SOLN
INTRAMUSCULAR | Status: DC | PRN
  Administered 2022-11-01: 10 mg via INTRAVENOUS

## 2022-11-01 MED ORDER — FENTANYL CITRATE (PF) 250 MCG/5ML IJ SOLN
INTRAMUSCULAR | Status: DC | PRN
  Administered 2022-11-01: 100 ug via INTRAVENOUS
  Administered 2022-11-01: 50 ug via INTRAVENOUS
  Administered 2022-11-01: 100 ug via INTRAVENOUS

## 2022-11-01 MED ORDER — MIDAZOLAM HCL 2 MG/2ML IJ SOLN
INTRAMUSCULAR | Status: AC
  Filled 2022-11-01: qty 2

## 2022-11-01 MED ORDER — LACTATED RINGERS IV SOLN: INTRAVENOUS | Status: DC | PRN

## 2022-11-01 MED ORDER — PHENYLEPHRINE 80 MCG/ML (10ML) SYRINGE FOR IV PUSH (FOR BLOOD PRESSURE SUPPORT)
PREFILLED_SYRINGE | INTRAVENOUS | Status: DC | PRN
  Administered 2022-11-01 (×4): 80 ug via INTRAVENOUS

## 2022-11-01 MED ORDER — ONDANSETRON HCL 4 MG/2ML IJ SOLN
INTRAMUSCULAR | Status: DC | PRN
  Administered 2022-11-01: 4 mg via INTRAVENOUS

## 2022-11-01 MED ORDER — MIDAZOLAM HCL 2 MG/2ML IJ SOLN
INTRAMUSCULAR | Status: DC | PRN
  Administered 2022-11-01 (×2): 2 mg via INTRAVENOUS

## 2022-11-01 MED ORDER — MUPIROCIN 2 % EX OINT
1.0000 | TOPICAL_OINTMENT | Freq: Two times a day (BID) | CUTANEOUS | Status: AC
  Administered 2022-11-01 – 2022-11-05 (×10): 1 via NASAL
  Filled 2022-11-01 (×2): qty 22

## 2022-11-01 MED ORDER — MAGNESIUM SULFATE 2 GM/50ML IV SOLN
2.0000 g | Freq: Once | INTRAVENOUS | Status: AC
  Administered 2022-11-01: 2 g via INTRAVENOUS
  Filled 2022-11-01: qty 50

## 2022-11-01 MED ORDER — PHENYLEPHRINE HCL-NACL 20-0.9 MG/250ML-% IV SOLN
INTRAVENOUS | Status: DC | PRN
  Administered 2022-11-01: 50 ug/min via INTRAVENOUS

## 2022-11-01 MED ORDER — ROCURONIUM BROMIDE 10 MG/ML (PF) SYRINGE
PREFILLED_SYRINGE | INTRAVENOUS | Status: DC | PRN
  Administered 2022-11-01 (×2): 50 mg via INTRAVENOUS

## 2022-11-01 MED ORDER — ROCURONIUM BROMIDE 10 MG/ML (PF) SYRINGE
PREFILLED_SYRINGE | INTRAVENOUS | Status: AC
  Filled 2022-11-01: qty 10

## 2022-11-01 MED ORDER — 0.9 % SODIUM CHLORIDE (POUR BTL) OPTIME
TOPICAL | Status: DC | PRN
  Administered 2022-11-01: 3000 mL

## 2022-11-01 MED ORDER — FENTANYL CITRATE (PF) 250 MCG/5ML IJ SOLN
INTRAMUSCULAR | Status: AC
  Filled 2022-11-01: qty 5

## 2022-11-01 SURGICAL SUPPLY — 38 items
BAG COUNTER SPONGE SURGICOUNT (BAG) ×2 IMPLANT
BAG SPNG CNTER NS LX DISP (BAG) ×2
CANISTER SUCT 3000ML PPV (MISCELLANEOUS) ×2 IMPLANT
COVER SURGICAL LIGHT HANDLE (MISCELLANEOUS) ×2 IMPLANT
DRAPE LAPAROSCOPIC ABDOMINAL (DRAPES) ×2 IMPLANT
DRAPE WARM FLUID 44X44 (DRAPES) ×2 IMPLANT
ELECT BLADE 6.5 EXT (BLADE) IMPLANT
ELECT CAUTERY BLADE 6.4 (BLADE) ×2 IMPLANT
ELECT REM PT RETURN 9FT ADLT (ELECTROSURGICAL) ×2
ELECTRODE REM PT RTRN 9FT ADLT (ELECTROSURGICAL) ×2 IMPLANT
GAUZE PAD ABD 8X10 STRL (GAUZE/BANDAGES/DRESSINGS) IMPLANT
GAUZE SPONGE 4X4 12PLY STRL (GAUZE/BANDAGES/DRESSINGS) IMPLANT
GLOVE BIO SURGEON STRL SZ8 (GLOVE) ×2 IMPLANT
GLOVE BIOGEL PI IND STRL 8 (GLOVE) ×2 IMPLANT
GOWN STRL REUS W/ TWL LRG LVL3 (GOWN DISPOSABLE) ×2 IMPLANT
GOWN STRL REUS W/ TWL XL LVL3 (GOWN DISPOSABLE) ×2 IMPLANT
GOWN STRL REUS W/TWL LRG LVL3 (GOWN DISPOSABLE) ×2
GOWN STRL REUS W/TWL XL LVL3 (GOWN DISPOSABLE) ×2
HANDLE SUCTION POOLE (INSTRUMENTS) ×2 IMPLANT
KIT BASIN OR (CUSTOM PROCEDURE TRAY) ×2 IMPLANT
KIT OSTOMY DRAINABLE 2.75 STR (WOUND CARE) IMPLANT
KIT TURNOVER KIT B (KITS) ×2 IMPLANT
LIGASURE IMPACT 36 18CM CVD LR (INSTRUMENTS) IMPLANT
NS IRRIG 1000ML POUR BTL (IV SOLUTION) ×4 IMPLANT
PACK GENERAL/GYN (CUSTOM PROCEDURE TRAY) ×2 IMPLANT
PAD ARMBOARD 7.5X6 YLW CONV (MISCELLANEOUS) ×2 IMPLANT
STAPLER VISISTAT 35W (STAPLE) ×2 IMPLANT
SUCTION POOLE HANDLE (INSTRUMENTS) ×2
SUT BOLSTER RETENT 3/16X1 3/4 (MISCELLANEOUS) ×2
SUT ETHILON 2 LR (SUTURE) IMPLANT
SUT PDS AB 1 TP1 96 (SUTURE) IMPLANT
SUT SILK 2 0 SH CR/8 (SUTURE) ×2 IMPLANT
SUT SILK 2 0 TIES 10X30 (SUTURE) ×2 IMPLANT
SUT SILK 3 0 SH CR/8 (SUTURE) ×2 IMPLANT
SUT SILK 3 0 TIES 10X30 (SUTURE) ×2 IMPLANT
SUT VIC AB 3-0 SH 18 (SUTURE) IMPLANT
SUTURE BOLSTER RTNT 3/16X1 3/4 (MISCELLANEOUS) IMPLANT
TOWEL GREEN STERILE (TOWEL DISPOSABLE) ×2 IMPLANT

## 2022-11-01 NOTE — Progress Notes (Signed)
Patient ID: Brent Allison, male   DOB: December 29, 1945, 76 y.o.   MRN: [id number] 2 Days Post-Op    Subjective: Awake on vent ROS negative except as listed above. Objective: Vital signs in last 24 hours: Temp:  [98.1 F (36.7 C)-99.8 F (37.7 C)] 98.7 F (37.1 C) (12/11 0700) Pulse Rate:  [85-111] 93 (12/11 0700) Resp:  [15-28] 20 (12/11 0700) BP: (87-127)/(51-68) 105/56 (12/11 0700) SpO2:  [97 %-100 %] 98 % (12/11 0700) FiO2 (%):  [30 %] 30 % (12/11 0600) Weight:  [89.8 kg] 89.8 kg (12/11 0500) Last BM Date :  (PTA)  Intake/Output from previous day: 12/10 0701 - 12/11 0700 In: 2653.8 [I.V.:2505.2; IV Piggyback:148.6] Out: 2230 [Urine:505; Emesis/NG output:250; Drains:1475] Intake/Output this shift: No intake/output data recorded.  General appearance: awake on vent Resp: clear to auscultation bilaterally Cardio: regular rate and rhythm GI: open abdomen VAC Neuro: F/C  Lab Results: CBC  Recent Labs    10/31/22 0652 10/31/22 1935 11/09/2022 0157  WBC 2.2*  --  15.3*  HGB 12.6* 11.0* 10.5*  HCT 37.9* 33.5* 32.5*  PLT 182  --  143*   BMET Recent Labs    10/31/22 0652 10/22/2022 0157  NA 143 141  K 4.0 5.2*  CL 110 113*  CO2 22 19*  GLUCOSE 237* 144*  BUN 22 29*  CREATININE 1.79* 1.84*  CALCIUM 8.5* 8.3*   PT/INR Recent Labs    10/24/2022 2101  LABPROT 19.3*  INR 1.6*   ABG Recent Labs    10/31/22 0236  PHART 7.268*  HCO3 17.5*    Studies/Results:   Anti-infectives: Anti-infectives (From admission, onward)    Start     Dose/Rate Route Frequency Ordered Stop   10/31/22 0400  piperacillin-tazobactam (ZOSYN) IVPB 3.375 g        3.375 g 12.5 mL/hr over 240 Minutes Intravenous Every 8 hours 11/20/2022 2330     11/09/2022 2130  piperacillin-tazobactam (ZOSYN) IVPB 3.375 g        3.375 g 100 mL/hr over 30 Minutes Intravenous  Once 11/12/2022 2120 10/22/2022 2221       Assessment/Plan: POD 1 s/p Ex lap, transverse colectomy and placement of abthera  wound vac for bowel perforation, feculent peritonitis, mesenteric ischemia by Dr. Redmond Pulling on 10/31/22 - Intra-Op Findings: significant stool contamination, old periumbilical mesh, omentum and colonic tissue very friable and essentially fell apart, silk sutures marking proximal transverse & distal transverse colon  - Return to the OR today for ex lap, colostomy and closure. Likely will use retentions as well in light of poor tissue quality. I discussed the procedure, risks, and benefits with his daughters on the phone. They agree. Consent done. - Cont Zosyn - Appreciate CCM's care and I D/W Dr. Tacy Learn on the unit  FEN - Strict NPO, no enteral nutrition yet, Cont OGT to LIWS VTE - SCDs, On Eliquis at baseline. Andexxa pre-op.  ID - Zosyn. His wbc is 15.3 Foley - In place, monitor I/O.  Plan - OR, CCM is checking another troponin  - Per CCM -  AKI on CKD3A Amyloidosis  History of recurrent DVT: Most recently 01/2022  LOS: 2 days    Georganna Skeans, MD, MPH, FACS Trauma & General Surgery Use [email or web address] to contact on call provider  10/31/2022

## 2022-11-01 NOTE — Op Note (Signed)
  10/28/2022  3:21 PM  PATIENT:  Brent Allison  76 y.o. male  PRE-OPERATIVE DIAGNOSIS:  S/P Colectomy , Open Abdomen  POST-OPERATIVE DIAGNOSIS:  S/P Colectomy , Open Abdomen  PROCEDURE:  Procedure(s): Exploratory laparotomy Colostomy Abdominal closure including retention sutures  SURGEON:  Surgeon(s): Georganna Skeans, MD  ASSISTANTS: Alferd Apa, PA-C   ANESTHESIA:   general  EBL:  Total I/O In: 3299 [I.V.:1353.2; IV Piggyback:102.8] Out: 620 [Urine:400; Drains:200; Blood:20]  BLOOD ADMINISTERED:none  DRAINS: none   SPECIMEN:  No Specimen  DISPOSITION OF SPECIMEN:  N/A  COUNTS:  YES  DICTATION: .Dragon Dictation Findings: No further transverse colon necrosis.  Tissues are extremely friable.  Procedure in detail.  Informed consent was obtained.  He is on IV antibiotics.  He was brought directly from the intensive care unit to the operating room on the ventilator.  The outer portion of his open abdomen VAC was removed.  His abdomen was prepped and draped in a sterile fashion.  We did a timeout procedure.  The inner VAC drape was irrigated and carefully removed.  There was no succus or foul-smelling fluid present in the abdomen.  The abdomen was gently explored.  His omentum was extremely friable and would fall apart with handling.  The proximal stapled off end of the transverse colon was located.  Some omentum was freed up off of this using the LigaSure.  The distal portion of the colon was encased and fat but noted to not be leaking or ischemic.  The abdomen was irrigated out.  No other abnormalities were noted.  A circular incision was made in the right abdominal wall and this circle of skin and subcutaneous fat was cored out.  A cruciate incision was made in the fascia and this was opened into the abdomen.  I enlarged the hole of the abdominal musculature to allow the colostomy to pass through.  We then passed the proximal transverse colon out through the stoma hole.   The abdomen was rechecked and there was no bleeding.  NG tube was in good position.  The fascia was then closed as follows: I placed a six #2 nylon retention sutures and then I ran the fascia with #1 looped PDS.  I then tied the retention sutures using wound bridges.  I matured the colostomy with 3-0 Vicryl sutures I placed additional sutures due to how friable his tissues are.  A sterile wet-to-dry dressing was applied and a colostomy appliance.  All counts were correct.  He tolerated the procedure without any apparent complications and was taken directly back to the intensive care unit on the ventilator in critical condition. PATIENT DISPOSITION:  ICU - intubated and critically ill.   Delay start of Pharmacological VTE agent (>24hrs) due to surgical blood loss or risk of bleeding:  yes  Georganna Skeans, MD, MPH, FACS Pager: [phone]  12/11/20233:21 PM

## 2022-11-01 NOTE — Consult Note (Signed)
[facility] Nurse ostomy consult note Consult received for new ostomy created today by Dr. Grandville Silos. Will see tomorrow for first post op ostomy assessment.  [place] nursing team will follow, and will remain available to this patient, the nursing and medical teams.    Thank you for inviting [place] to participate in this patient's Plan of Care.  Maudie Flakes, MSN, RN, CNS, [place], Serita Grammes, [employer], [employer] phone:  [phone]

## 2022-11-01 NOTE — Progress Notes (Signed)
eLink Physician-Brief Progress Note Patient Name: Brent Allison DOB: [birth year] MRN: [id number]   Date of Service  11/12/2022  HPI/Events of Note  Notified of elevated troponin of 547.    Pt remains intubated and will go back to the OR today.  He had a perforate viscus, s/p ex lap transverse colectomy and placement of abthera wound vac for bowel perforation, feculent peritonitis, mesenteric ischemia by Dr. Redmond Pulling on 10/31/22.  Troponin was ordered due to prolonged Qtc.  Pt was on eliquis at home but given andexxa prior to surgery.  eICU Interventions  Trend troponin.  Get EKG now.      Intervention Category Intermediate Interventions: Other:  Elsie Lincoln 11/03/2022, 6:30 AM

## 2022-11-01 NOTE — Progress Notes (Addendum)
NAME:  Brent Allison, MRN:  [id number], DOB:  12/21/1945, LOS: 2 ADMISSION DATE:  10/26/2022, CONSULTATION DATE:  10/29/2022 REFERRING MD:  Dr. Alvino Chapel ED, CHIEF COMPLAINT: Abdominal pain  History of Present Illness:  76 year old with known history of below presents with acute onset of abdominal pain about 7:30 PM, peritoneal exam, found to have viscus perforation on CT of the abdomen.  Acute onset of pain.  CT scan revealed intestinal perforation.  He takes Eliquis for VTE history.  Last dose this morning.  Received Andexxa in the ED for reversal.  Surgery was consulted.  Taken to the OR.  They recommended medical ICU admission versus surgical admission given complex medical history.  Lab notable for mild elevation in creatinine, AKI, mild leukocytosis with left shift, hemoglobin appears at baseline.  Blood cultures were obtained.  Pertinent  Medical History  History of amyloidosis, history of diastolic dysfunction although euvolemic on exam, history of DVT  Significant Hospital Events: Including procedures, antibiotic start and stop dates in addition to other pertinent events   10/31/2022 presents with acute onset abdominal pain found to have abdominal perforation 10/31/22  - Surgery : POD 0 s/p Ex lap, transverse colectomy and placement of abthera wound vac for bowel perforation, feculent peritonitis, mesenteric ischemia by Dr. Redmond Pulling on 10/31/22. - Intra-Op Findings: significant stool contamination, old periumbilical mesh, omentum and colonic tissue very friable and essentially fell apart, silk sutures marking proximal transverse & distal transverse colon.  On Eliquis at baseline. Andexxa pre-op. T   Interim History / Subjective:   Afebrile overnight, but did spike a leukocytosis today. Unclear as to why leukopenic yesterday, maybe lab error.  Hemoglobin trending down, having bloody drainage from wound vac. Lactate trending down. Needing pressor support to maintain MAPs. Troponin  peaked at 547, likely demand given no active ischemic changes on EKG.   Going back to the OR today with CCS for ex-lap, colostomy and closure.   Objective   Blood pressure (!) 104/58, pulse 93, temperature 98.7 F (37.1 C), temperature source Oral, resp. rate 20, height 6' (1.829 m), weight 89.8 kg, SpO2 97 %.    Vent Mode: PRVC FiO2 (%):  [30 %] 30 % Set Rate:  [20 bmp] 20 bmp Vt Set:  [620 mL] 620 mL PEEP:  [5 cmH20] 5 cmH20 Plateau Pressure:  [10 cmH20-18 cmH20] 15 cmH20   Intake/Output Summary (Last 24 hours) at 10/27/2022 0854 Last data filed at 11/21/2022 9242 Gross per 24 hour  Intake 2737.2 ml  Output 2285 ml  Net 452.2 ml   Filed Weights   10/29/2022 2100 10/31/22 0100 10/26/2022 0500  Weight: 85.3 kg 89.2 kg 89.8 kg    Examination: General: Critically ill-appearing elderly male, laying in bed on mechanical ventilation, NAD. HENT: Normocephalic, atraumatic. ET tube and OG tube in place. Eyes: PERRL, EOMI. CV: Normal rate and regular rhythm, no obvious murmurs rubs or gallops noted. Pulm: Ventilated breath sounds bilaterally, no adventitious sounds noted.  Synchronous with vent. Abdomen: Soft, mildly distended, wound VAC in place with serosanguineous drainage. Extremities: Warm and well-perfused, no peripheral edema noted. Neuro: On sedation, RASS 0.  Awake and alert, able to follow simple commands.  Moving all extremities spontaneously.  Nods head appropriately to questions.   Resolved Hospital Problem list     Assessment & Plan:  Severe sepsis secondary to bowel perforation, feculent peritonitis, mesenteric ischemia - present on admission Status post ex lap with transverse colectomy and placement of abthera wound vac (Dr. Redmond Pulling,  10/31/2022) Lactic acidosis - present on admission He is POD 1. Remained afebrile overnight, although does have leukocytosis. Leukopenia noted yesterday, possibly lab error. Lactate trending down. He did require neosynephrine overnight to  maintain MAPs, stopped this AM. His shock has resolved. Discussed with CCS, plan to return to OR today for ex-lap, colostomy, and closure. Currently strict NPO with OGT to LIWS. Abthera wound vac with serosanguinous drainage, expected. -continue zosyn -maintain MAP goal >65 -fluid bolus if needed -NPO, OGT to LIWS -fentanyl gtt, fentanyl prn, and dilaudid prn for pain control -remains intubated given plan to return to OR today -blood cultures no growth past 2 days  Acute hypoxic respiratory failure 2/2 above RASS 0 while on ventilator, able to nod head and follow simple commands. FiO2 30%. Unable to extubate at this time given plan to return to OR. -PRVC -PAD, VAP bundle -wean as able after surgery today if no plan to return to OR thereafter  History of recurrent VTE Most recently on 01/2022. On indefinite anticoagulation with eliquis. Currently being held given need for surgery. Will defer to surgery as to restart date. May need IVC filter if [facility] needs to held for longer than expected. -SCDs -holding eliquis and chemoprophylaxis given need for surgery -resumption of full anticoagulation to be discussed post-op -consider IVC filter if unable to restart AC within the next couple of days  AKI on CKD 3A Baseline Cr ~1.4-1.5 per chart review. AKI likely due to severe sepsis. Kidney function relatively unchanged overnight. -maintain MAPs >65 for adequate renal perfusion -avoid nephrotoxic meds -strict I/Os -trend kidney function  Type 2 NSTEMI Hx of LVH Qtc prolongation Troponins peaked in 500s. EKG with no active ischemic changes. Likely demand ischemia given severe sepsis from bowel perforation. -f/u ECHO results -avoid QT prolonging meds as able  Macrocytic anemia Acute blood loss 2/2 ex-lap Hemoglobin trending down (12.6 > 10.5). Will check folate and B12 given MCV 100. No active bleeding noted, will continue to trend Hgb curve. -f/u folate, B12 -trend hgb curve -transfuse if  hgb <7 and/or hemodynamic instability  Prediabetes A1c 5.7%. He is at risk for hypoglycemia given lack of oral or parenteral nutrition. Also at risk for hyperglycemia given acute stressors.  -D5-LR _0 /hr -sensitive SSI -trend CBGs  Multiple Myeloma AL Amyloidosis MM diagnosed in 2012, AL amyloidosis diagnosed in 2013. Remote history of bone marrow transplant. Treatment being held since August 2023 due to low energy. -supportive care -f/u with Dr. [name] as outpatient   Best Practice (right click and "Reselect all SmartList Selections" daily)   Diet/type: NPO  DVT prophylaxis: SCD - CCS to inform when to start [facility] GI prophylaxis:  PPI  Lines: N/A  Foley:  yes, placed 12/10 and still needed Code Status:  full code Last date of multidisciplinary goals of care discussion [daughter updated 12/10]   ATTESTATION & Lysle Rubens, MD 11/12/2022, 8:55 AM    LABS    PULMONARY Recent Labs  Lab 11/21/2022 2125 10/31/22 0236  PHART  --  7.268*  PCO2ART  --  38.0  PO2ART  --  133*  HCO3  --  17.5*  TCO2 22 19*  O2SAT  --  99    CBC Recent Labs  Lab 11/05/2022 2101 11/06/2022 2125 10/31/22 0652 10/31/22 1935 11/07/2022 0157  HGB 13.2   < > 12.6* 11.0* 10.5*  HCT 40.0   < > 37.9* 33.5* 32.5*  WBC 11.3*  --  2.2*  --  15.3*  PLT 189  --  182  --  143*   < > = values in this interval not displayed.    COAGULATION Recent Labs  Lab 10/28/2022 2101  INR 1.6*    CHEMISTRY Recent Labs  Lab 11/16/2022 2101 11/15/2022 2125 10/31/22 0236 10/31/22 0652 11/04/2022 0157  NA 139 141 141 143 141  K 3.5 3.5 3.6 4.0 5.2*  CL 107 105  --  110 113*  CO2 20*  --   --  22 19*  GLUCOSE 140* 139*  --  237* 144*  BUN 23 23  --  22 29*  CREATININE 1.75* 1.70*  --  1.79* 1.84*  CALCIUM 9.1  --   --  8.5* 8.3*  MG  --   --   --  1.7 1.8  PHOS  --   --   --  3.4 3.4   Estimated Creatinine Clearance: 36.9 mL/min (A) (by C-G formula based on SCr of 1.84 mg/dL  (H)).   LIVER Recent Labs  Lab 10/22/2022 2101 11/18/2022 0157  AST 23 19  ALT 12 11  ALKPHOS 71 34*  BILITOT 0.6 0.7  PROT 5.4* 4.5*  ALBUMIN 3.3* 2.4*  INR 1.6*  --      INFECTIOUS Recent Labs  Lab 10/31/22 0652 10/31/22 1935 10/28/2022 0157  LATICACIDVEN 4.1* 2.8* 2.4*     ENDOCRINE CBG (last 3)  Recent Labs    10/31/22 2321 10/26/2022 0321 10/26/2022 0740  GLUCAP 131* 126* 127*         IMAGING x48h  - image(s) personally visualized  -   highlighted in bold DG CHEST PORT 1 VIEW  Result Date: 10/31/2022 CLINICAL DATA:  Endotracheal tube EXAM: PORTABLE CHEST 1 VIEW COMPARISON:  10/05/2022 FINDINGS: Endotracheal tube with tip just below the clavicular heads. The enteric tube at least reaches the stomach. Artifact from EKG leads. Worsening opacity at the lung bases with hazy appearance. No edema, effusion, or pneumothorax. Normal heart size. IMPRESSION: 1. Unremarkable hardware. 2. Worsening aeration at the bases with hazy appearance favoring atelectasis. Electronically Signed   By: Jorje Guild M.D.   On: 10/31/2022 05:40   CT Angio Abd/Pel W and/or Wo Contrast  Result Date: 10/28/2022 CLINICAL DATA:  Acute mesenteric ischemia. EXAM: CTA ABDOMEN AND PELVIS WITHOUT AND WITH CONTRAST TECHNIQUE: Multidetector CT imaging of the abdomen and pelvis was performed using the standard protocol during bolus administration of intravenous contrast. Multiplanar reconstructed images and MIPs were obtained and reviewed to evaluate the vascular anatomy. RADIATION DOSE REDUCTION: This exam was performed according to the departmental dose-optimization program which includes automated exposure control, adjustment of the mA and/or kV according to patient size and/or use of iterative reconstruction technique. CONTRAST:  58m OMNIPAQUE IOHEXOL 350 MG/ML SOLN COMPARISON:  10/29/2012. FINDINGS: VASCULAR Aorta: Aortic atherosclerosis. Normal caliber aorta without aneurysm, dissection, vasculitis or  significant stenosis. Celiac: Patent without evidence of aneurysm, dissection, vasculitis or significant stenosis. SMA: Patent without evidence of aneurysm, dissection, vasculitis or significant stenosis. Renals: Both renal arteries are patent without evidence of aneurysm, dissection, vasculitis, fibromuscular dysplasia or significant stenosis. An accessory renal artery is noted on the right. IMA: Patent. Inflow: Patent without evidence of aneurysm, dissection, vasculitis or significant stenosis. Proximal Outflow: Bilateral common femoral and visualized portions of the superficial and profunda femoral arteries are patent without evidence of aneurysm, dissection, vasculitis or significant stenosis. Veins: No portal venous gas. Review of the MIP images confirms the above findings. NON-VASCULAR Lower chest: Heart is enlarged and there  is a small pericardial effusion. There are trace bilateral pleural effusions with atelectasis at the lung bases. Hepatobiliary: No focal liver abnormality is seen. Stones are present within the gallbladder. No biliary ductal dilatation. Pancreas: Unremarkable. No pancreatic ductal dilatation or surrounding inflammatory changes. Spleen: Normal size. A vague hypodensity is present in the liver, most likely cyst or hemangioma. Adrenals/Urinary Tract: No adrenal nodule or mass. The kidneys enhance symmetrically. No renal calculus or hydronephrosis. Bladder is unremarkable. Stomach/Bowel: The stomach is distended with fluid and ingested debris. No bowel obstruction. Multiple foci of free air are noted in the upper abdomen and most pronounced in the mid left abdomen. Multiple scattered diverticula are present along the colon without evidence of diverticulitis. A moderate amount of retained stool is present in the colon. Mild colonic wall thickening involving the mid to distal transverse colon with suspected pneumatosis. Fat stranding is noted in the left upper quadrant. There is a focal defect  in the wall of the mid transverse colon, axial images 38 and 39, with perforation and focal collection of debris and air measuring 9.9 x 3.5 cm. Appendix is not seen. Lymphatic: No abdominal or pelvic lymphadenopathy. Reproductive: Prostate is unremarkable. Other: No abdominopelvic ascites. Musculoskeletal: Spinal fusion hardware is present L2-L5. Degenerative changes in the thoracolumbar spine. No acute or suspicious osseous abnormality. IMPRESSION: VASCULAR Aortic atherosclerosis without evidence of aneurysm. The aorta and large branch vessels are patent. NON-VASCULAR 1. Mild colonic wall thickening involving the mid to distal transverse colon with suspected pneumatosis. No portal venous gas is seen, however findings remain concerning for mesenteric ischemia. There is a focal perforation in the distal transverse colon in the left upper quadrant with leakage of stool and free air in the abdomen, best visualized on axial images 38 and 39. 2. Trace bilateral pleural effusions with atelectasis at the lung bases. 3. Cholelithiasis. 4. Diverticulosis without diverticulitis. 5. Remaining incidental findings as described above. Critical Value/emergent results were called by telephone at the time of interpretation on 11/17/2022 at 9:39 pm to provider Davonna Belling , who verbally acknowledged these results. Electronically Signed   By: Brett Fairy M.D.   On: 10/23/2022 22:12

## 2022-11-01 NOTE — Transfer of Care (Signed)
Immediate Anesthesia Transfer of Care Note  Patient: Brent Allison  Procedure(s) Performed: EXPLORATORY LAPAROTOMY AND CLOSURE (Abdomen) COLOSTOMY (Right: Abdomen)  Patient Location: ICU  Anesthesia Type:General  Level of Consciousness: sedated and unresponsive  Airway & Oxygen Therapy: Patient remains intubated per anesthesia plan and Patient placed on Ventilator (see vital sign flow sheet for setting)  Post-op Assessment: Report given to RN and Post -op Vital signs reviewed and stable  Post vital signs: Reviewed and stable  Last Vitals:  Vitals Value Taken Time  BP    Temp    Pulse    Resp    SpO2      Last Pain:  Vitals:   10/28/2022 1115  TempSrc: Oral  PainSc:       Patients Stated Pain Goal: 0 (92/42/[id number])  Complications: No notable events documented.

## 2022-11-01 NOTE — Anesthesia Postprocedure Evaluation (Signed)
Anesthesia Post Note  Patient: Brent Allison  Procedure(s) Performed: EXPLORATORY LAPAROTOMY AND CLOSURE (Abdomen) COLOSTOMY (Right: Abdomen)     Patient location during evaluation: SICU Anesthesia Type: General Level of consciousness: sedated Pain management: pain level controlled Vital Signs Assessment: post-procedure vital signs reviewed and stable Respiratory status: patient remains intubated per anesthesia plan Cardiovascular status: stable Postop Assessment: no apparent nausea or vomiting Anesthetic complications: no  No notable events documented.  Last Vitals:  Vitals:   11/17/2022 1730 11/03/2022 1745  BP: (!) 99/59 (!) 102/57  Pulse: 90 89  Resp: 20 20  Temp:    SpO2: 100% 100%    Last Pain:  Vitals:   11/17/2022 1611  TempSrc: Oral  PainSc:                  Barnet Glasgow

## 2022-11-01 NOTE — Progress Notes (Signed)
Initial Nutrition Assessment  DOCUMENTATION CODES:   Non-severe (moderate) malnutrition in context of chronic illness  INTERVENTION:   - Given malnutrition and increased nutrition needs, recommend initiation of TPN; discussed with Surgery and PCCM, plan is to start TPN tomorrow, 11/02/22  NUTRITION DIAGNOSIS:   Moderate Malnutrition related to chronic illness (amyloidosis, CHF, CKD IIIa) as evidenced by mild fat depletion, moderate muscle depletion.  GOAL:   Patient will meet greater than or equal to 90% of their needs  MONITOR:   Labs, Vent status, Weight trends, Skin, I & O's  REASON FOR ASSESSMENT:   Ventilator    ASSESSMENT:   76 year old male who presented to the ED on 12/09 with abdominal pain. 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 Pt admitted with severe sepsis with abdominal perforation, feculent peritonitis, mesenteric ischemia, AKI.  12/09 - s/p ex lap, transverse colectomy, placement of abthera wound VAC (abdomen open)  Discussed pt with RN and during ICU rounds. Pt to return to the OR today for ex lap, colostomy, and abdominal closure. Pt with OG tube to LIWS with brown/orange output.  Pt currently with open abdomen. Given malnutrition and increased nutrition needs, recommend initiation of TPN. Discussed with Surgery and PCCM. Plan to start TPN tomorrow, 11/02/22.  Unable to obtain diet and weight history at this time. Reviewed weight history in chart. Pt's weight has been fairly stable over the last 6 months between 85-89 kg. Suspect current weight is falsely elevated secondary to volume. Pt is net positive 3.4 L since admit.  Admit weight: 85.3 kg Current weight: 89.8 kg  Patient is currently intubated on ventilator support MV: 12.1 L/min Temp (24hrs), Avg:98.9 F (37.2 C), Min:98.1 F (36.7 C), Max:99.8 F (37.7 C)  Drips: D5 in LR: 100 ml/hr Fentanyl  Medications  reviewed and include: SSI q 4 hours, IV protonix, IV magnesium sulfate 2 gram x 1, IV abx  Labs reviewed: potassium 5.2, BUN 29, creatinine 1.84, lactic acid 2.4, WBC 15.3, platelets 143 CBG's: 126-214 x 24 hours  UOP: 505 ml x 24 hours OGT: 250 ml x 24 hours VAC: 1475 ml x 24 hours I/O's: +3.4 L since admit  NUTRITION - FOCUSED PHYSICAL EXAM:  Flowsheet Row Most Recent Value  Orbital Region Moderate depletion  Upper Arm Region Mild depletion  Thoracic and Lumbar Region Mild depletion  Buccal Region Unable to assess  Temple Region No depletion  Clavicle Bone Region Moderate depletion  Clavicle and Acromion Bone Region Moderate depletion  Scapular Bone Region Moderate depletion  Dorsal Hand No depletion  Patellar Region No depletion  Anterior Thigh Region Mild depletion  Posterior Calf Region Mild depletion  Edema (RD Assessment) None  Hair Reviewed  Eyes Reviewed  Mouth Reviewed  Skin Reviewed  Nails Reviewed       Diet Order:   Diet Order             Diet NPO time specified  Diet effective now                   EDUCATION NEEDS:   Not appropriate for education at this time  Skin:  Skin Assessment:  Skin Integrity Issues:: Other: abthera wound VAC to abdomen (open abdomen)  Last BM:  no documented BM  Height:   Ht Readings from Last 1 Encounters:  10/31/22 6' (1.829 m)    Weight:   Wt Readings from Last 1 Encounters:  11/12/2022  89.8 kg    Ideal Body Weight:  80.9 kg  BMI:  Body mass index is 26.85 kg/m.  Estimated Nutritional Needs:   Kcal:  2200-2400  Protein:  120-140 grams  Fluid:  >2.0 L    Gustavus Bryant, MS, RD, LDN Inpatient Clinical Dietitian Please see AMiON for contact information.

## 2022-11-01 NOTE — Anesthesia Procedure Notes (Signed)
Date/Time: 11/02/2022 2:14 PM  Performed by: Mariea Clonts, CRNAPre-anesthesia Checklist: Patient identified, Emergency Drugs available, Suction available, Patient being monitored and Timeout performed Patient Re-evaluated:Patient Re-evaluated prior to induction Oxygen Delivery Method: Circle system utilized Preoxygenation: Pre-oxygenation with 100% oxygen Induction Type: Inhalational induction with existing ETT Placement Confirmation: positive ETCO2 and breath sounds checked- equal and bilateral

## 2022-11-01 NOTE — Anesthesia Preprocedure Evaluation (Addendum)
Anesthesia Evaluation  Patient identified by MRN, date of birth, ID band Patient awake    Reviewed: Allergy & Precautions, NPO status , Patient's Chart, lab work & pertinent test results  Airway Mallampati: IV      Comment: Pt intubated Dental  (+) Dental Advisory Given   Pulmonary sleep apnea and Continuous Positive Airway Pressure Ventilation , former smoker   breath sounds clear to auscultation       Cardiovascular + Peripheral Vascular Disease and + DVT (01/2022)   Rhythm:Regular Rate:Normal  '16 ECHO: EF 55-60%. Wall motion was normal; there were no regional wall motion abnormalities. abnormal left ventricular relaxation (grade 1 diastolic dysfunction), no significant valvular abnormalities     Neuro/Psych  Neuromuscular disease    GI/Hepatic Neg liver ROS,,,Acute bowel perf   Endo/Other  negative endocrine ROS    Renal/GU ARFRenal diseaseLab Results      Component                Value               Date                      CREATININE               1.84 (H)            11/12/2022                BUN                      29 (H)              11/16/2022                NA                       141                 10/26/2022                K                        5.2 (H)             11/19/2022                CL                       113 (H)             11/05/2022                CO2                      19 (L)              11/09/2022              Prostate Ca    Musculoskeletal  (+) Arthritis ,    Abdominal   Peds  Hematology  (+) Blood dyscrasia, anemia Lab Results      Component                Value               Date  WBC                      15.3 (H)            11/06/2022                HGB                      10.5 (L)            10/23/2022                HCT                      32.5 (L)            11/12/2022                MCV                      100.0               11/12/2022                 PLT                      143 (L)             11/16/2022              Anesthesia Other Findings Amyloidosis  All: Heprin, Enoxaparin  Reproductive/Obstetrics                             Anesthesia Physical Anesthesia Plan  ASA: 4  Anesthesia Plan: General   Post-op Pain Management:    Induction: Inhalational  PONV Risk Score and Plan: 2 and Ondansetron and Dexamethasone  Airway Management Planned:   Additional Equipment: None  Intra-op Plan:   Post-operative Plan: Possible Post-op intubation/ventilation  Informed Consent: I have reviewed the patients History and Physical, chart, labs and discussed the procedure including the risks, benefits and alternatives for the proposed anesthesia with the patient or authorized representative who has indicated his/her understanding and acceptance.     Dental advisory given and Consent reviewed with POA  Plan Discussed with: CRNA and Surgeon  Anesthesia Plan Comments:         Anesthesia Quick Evaluation

## 2022-11-02 ENCOUNTER — Encounter (HOSPITAL_COMMUNITY): Payer: Self-pay | Admitting: General Surgery

## 2022-11-02 DIAGNOSIS — R6521 Severe sepsis with septic shock: Secondary | ICD-10-CM | POA: Diagnosis not present

## 2022-11-02 DIAGNOSIS — K668 Other specified disorders of peritoneum: Secondary | ICD-10-CM | POA: Diagnosis not present

## 2022-11-02 DIAGNOSIS — R198 Other specified symptoms and signs involving the digestive system and abdomen: Secondary | ICD-10-CM | POA: Diagnosis not present

## 2022-11-02 DIAGNOSIS — A419 Sepsis, unspecified organism: Secondary | ICD-10-CM | POA: Diagnosis not present

## 2022-11-02 LAB — CBC WITH DIFFERENTIAL/PLATELET
Abs Immature Granulocytes: 0.04 K/uL (ref 0.00–0.07)
Basophils Absolute: 0 K/uL (ref 0.0–0.1)
Basophils Relative: 0 %
Eosinophils Absolute: 0 K/uL (ref 0.0–0.5)
Eosinophils Relative: 0 %
HCT: 26.2 % — ABNORMAL LOW (ref 39.0–52.0)
Hemoglobin: 8.6 g/dL — ABNORMAL LOW (ref 13.0–17.0)
Immature Granulocytes: 1 %
Lymphocytes Relative: 8 %
Lymphs Abs: 0.6 K/uL — ABNORMAL LOW (ref 0.7–4.0)
MCH: 32.6 pg (ref 26.0–34.0)
MCHC: 32.8 g/dL (ref 30.0–36.0)
MCV: 99.2 fL (ref 80.0–100.0)
Monocytes Absolute: 0.3 K/uL (ref 0.1–1.0)
Monocytes Relative: 4 %
Neutro Abs: 6.7 K/uL (ref 1.7–7.7)
Neutrophils Relative %: 87 %
Platelets: 111 K/uL — ABNORMAL LOW (ref 150–400)
RBC: 2.64 MIL/uL — ABNORMAL LOW (ref 4.22–5.81)
RDW: 14.6 % (ref 11.5–15.5)
Smear Review: NORMAL
WBC: 7.6 K/uL (ref 4.0–10.5)
nRBC: 0 % (ref 0.0–0.2)

## 2022-11-02 LAB — LACTIC ACID, PLASMA: Lactic Acid, Venous: 1.6 mmol/L (ref 0.5–1.9)

## 2022-11-02 LAB — COMPREHENSIVE METABOLIC PANEL WITH GFR
ALT: 11 U/L (ref 0–44)
AST: 15 U/L (ref 15–41)
Albumin: 2 g/dL — ABNORMAL LOW (ref 3.5–5.0)
Alkaline Phosphatase: 32 U/L — ABNORMAL LOW (ref 38–126)
Anion gap: 7 (ref 5–15)
BUN: 31 mg/dL — ABNORMAL HIGH (ref 8–23)
CO2: 21 mmol/L — ABNORMAL LOW (ref 22–32)
Calcium: 8.3 mg/dL — ABNORMAL LOW (ref 8.9–10.3)
Chloride: 112 mmol/L — ABNORMAL HIGH (ref 98–111)
Creatinine, Ser: 1.82 mg/dL — ABNORMAL HIGH (ref 0.61–1.24)
GFR, Estimated: 38 mL/min — ABNORMAL LOW
Glucose, Bld: 179 mg/dL — ABNORMAL HIGH (ref 70–99)
Potassium: 4.8 mmol/L (ref 3.5–5.1)
Sodium: 140 mmol/L (ref 135–145)
Total Bilirubin: 0.7 mg/dL (ref 0.3–1.2)
Total Protein: 4.1 g/dL — ABNORMAL LOW (ref 6.5–8.1)

## 2022-11-02 LAB — TRIGLYCERIDES: Triglycerides: 65 mg/dL

## 2022-11-02 LAB — HEMOGLOBIN AND HEMATOCRIT, BLOOD
HCT: 27.1 % — ABNORMAL LOW (ref 39.0–52.0)
Hemoglobin: 8.8 g/dL — ABNORMAL LOW (ref 13.0–17.0)

## 2022-11-02 LAB — GLUCOSE, CAPILLARY
Glucose-Capillary: 154 mg/dL — ABNORMAL HIGH (ref 70–99)
Glucose-Capillary: 152 mg/dL — ABNORMAL HIGH (ref 70–99)
Glucose-Capillary: 133 mg/dL — ABNORMAL HIGH (ref 70–99)
Glucose-Capillary: 104 mg/dL — ABNORMAL HIGH (ref 70–99)
Glucose-Capillary: 105 mg/dL — ABNORMAL HIGH (ref 70–99)
Glucose-Capillary: 121 mg/dL — ABNORMAL HIGH (ref 70–99)

## 2022-11-02 LAB — VITAMIN B12: Vitamin B-12: 1024 pg/mL — ABNORMAL HIGH (ref 180–914)

## 2022-11-02 LAB — PHOSPHORUS: Phosphorus: 3.2 mg/dL (ref 2.5–4.6)

## 2022-11-02 LAB — MAGNESIUM: Magnesium: 2.2 mg/dL (ref 1.7–2.4)

## 2022-11-02 LAB — FOLATE: Folate: 7.9 ng/mL (ref >5.9)

## 2022-11-02 MED ORDER — PIPERACILLIN-TAZOBACTAM 3.375 G IVPB
3.3750 g | Freq: Three times a day (TID) | INTRAVENOUS | Status: AC
  Administered 2022-11-02 – 2022-11-03 (×3): 3.375 g via INTRAVENOUS
  Administered 2022-11-03: 6.75 g via INTRAVENOUS
  Administered 2022-11-03 – 2022-11-05 (×7): 3.375 g via INTRAVENOUS
  Filled 2022-11-02 (×12): qty 50

## 2022-11-02 MED ORDER — ORAL CARE MOUTH RINSE
15.0000 mL | OROMUCOSAL | Status: DC
  Administered 2022-11-02 – 2022-11-08 (×18): 15 mL via OROMUCOSAL

## 2022-11-02 NOTE — Consult Note (Signed)
[facility] Nurse ostomy consult note: POD 1 Stoma type/location: RUQ colostomy Stomal assessment/size: slightly smaller than 2 inches round, red, moist, raised, edematous with friable mucocutaneous junction, small amount of bleeding. Peristomal assessment: intact, clear. Small amount of skin between medial stomal margin and midline wound with retention sutures Treatment options for stomal/peristomal skin: skin barrier ring Output: scant serosanguinous drainage in pouch Ostomy pouching: 2pc. 2 and 3/4 inch ostomy pouching system with skin barrier ring. Pouch is [employer], skin barrier is Kellie Simmering #2 and skin barrier ring is Kellie Simmering # [phone] Education provided: None today. No visitors in room.  Enrolled patient in [place] program: No   WOC nursing team will follow for continued ostomy management and teaching as patient stabilizes and is out of ICU. [place] nursing will remain available to this patient, the nursing, surgical and medical teams.    Supply information provided for Bedside RN to obtain:5 of each to bedside are requested.  2 piece, 2 and 3/4 inch ostomy pouching system with skin barrier ring. Pouch is [employer], skin barrier is Kellie Simmering #2 and skin barrier ring is Kellie Simmering # [phone]  Thank you for inviting [place] to participate in this patient's Plan of Care.  Maudie Flakes, MSN, RN, CNS, [place], Serita Grammes, [employer], [employer] phone:  [phone]

## 2022-11-02 NOTE — Progress Notes (Signed)
NAME:  Brent Allison, MRN:  [id number], DOB:  [birth year], LOS: 3 ADMISSION DATE:  11/02/2022, CONSULTATION DATE:  11/02/22 REFERRING MD:  Dr. Alvino Chapel ED, CHIEF COMPLAINT: Abdominal pain  History of Present Illness:  76 year old with known history of below presents with acute onset of abdominal pain about 7:30 PM, peritoneal exam, found to have viscus perforation on CT of the abdomen.  Acute onset of pain.  CT scan revealed intestinal perforation.  He takes Eliquis for VTE history.  Last dose this morning.  Received Andexxa in the ED for reversal.  Surgery was consulted.  Taken to the OR.  They recommended medical ICU admission versus surgical admission given complex medical history.  Lab notable for mild elevation in creatinine, AKI, mild leukocytosis with left shift, hemoglobin appears at baseline.  Blood cultures were obtained.  Pertinent  Medical History  History of amyloidosis, history of diastolic dysfunction although euvolemic on exam, history of DVT  Significant Hospital Events: Including procedures, antibiotic start and stop dates in addition to other pertinent events   11/02/2022 presents with acute onset abdominal pain found to have abdominal perforation 10/31/22  - Surgery : POD 0 s/p Ex lap, transverse colectomy and placement of abthera wound vac for bowel perforation, feculent peritonitis, mesenteric ischemia by Dr. Redmond Pulling on 10/31/22. - Intra-Op Findings: significant stool contamination, old periumbilical mesh, omentum and colonic tissue very friable and essentially fell apart, silk sutures marking proximal transverse & distal transverse colon.  On Eliquis at baseline. Andexxa pre-op. T  11/06/2022 - surgery: POD 0 s/p ex-lap, creation of colostomy and abdominal closure with retention sutures by Dr. Grandville Silos; holding [facility]. Tolerating PS 5 over 5, extubate.  Interim History / Subjective:   Afebrile overnight and leukocytosis has resolved. Hemoglobin continuing to trend down.  No pressor requirements and fentanyl gtt switched off. He is awake and alert, denies any pain. Switched to PS 5/5, tolerating well. Possible extubation today.   Will hold off on starting TPN. If extubated, can consider starting CLD later today or tomorrow per CCS.   Holding AC given downtrend in Hgb. If stabilizes by tomorrow, can consider restarting AC. If unable to restart soon, will need to consider IVC filter placement given recurrent DVT history.  Objective   Blood pressure 121/66, pulse 93, temperature 98 F (36.7 C), temperature source Oral, resp. rate 13, height 6' (1.829 m), weight 89.8 kg, SpO2 99 %.    Vent Mode: PSV;CPAP FiO2 (%):  [30 %] 30 % Set Rate:  [20 bmp] 20 bmp Vt Set:  [620 mL] 620 mL PEEP:  [5 cmH20] 5 cmH20 Pressure Support:  [5 cmH20] 5 cmH20 Plateau Pressure:  [14 cmH20-17 cmH20] 17 cmH20   Intake/Output Summary (Last 24 hours) at 11/02/2022 0842 Last data filed at 11/02/2022 0800 Gross per 24 hour  Intake 3626.15 ml  Output 990 ml  Net 2636.15 ml   Filed Weights   10/31/22 0100 11/06/2022 0500 11/02/22 0500  Weight: 89.2 kg 89.8 kg 89.8 kg    Examination: General: critically ill-appearing elderly male, laying in bed on mechanical ventilation, NAD. HENT: [place]/AT. ETT and OGT in place. Eyes: PERRL, EOMI. CV: normal rate and regular rhythm, no m/r/g. Pulm: CTABL, no adventitious sounds noted. Tolerating PS 5/5. Abdomen: soft, mildly distended. Ostomy site with bloody output, no stool yet. Nontender to palpation. Retention sutures in place. Extremities: warm and well perfused, no peripheral edema noted. Neuro: RASS 0, off of sedation. Awake and alert, able to follow simple commands. Moving  extremities spontaneously and to command. Nods head appropriately.   Resolved Hospital Problem list   Lactic acidosis - present on admission - resolved 12/12  Assessment & Plan:  Severe sepsis secondary to bowel perforation, feculent peritonitis, mesenteric ischemia -  present on admission S/p ex-lap with transverse colectomy and placement of abthera wound vac (Dr. Redmond Pulling, 10/31/2022) S/p ex-lap with creation of colostomy and abdominal closure with retention sutures (Dr. Grandville Silos 10/23/2022) POD 2 following transverse colectomy and POD 1 following creation of colostomy and abdominal closure with retention sutures. He remains afebrile and leukocytosis has resolved. Not requiring pressor support and off of sedation. Pain is well controlled currently. Lactate has been cleared. Hopeful for extubation today. If so, can hold off on TPN and possibly start CLD later today or tomorrow per CCS.  -continue zosyn (last day through 12/15) -maintain MAP goal >65 -NPO, consider starting CLD later today or tomorrow if extubated -fentanyl prn and dilaudid prn for pain control -stopped fentanyl gtt -blood cultures no growth thus far -WOC for ostomy care  Acute hypoxic respiratory failure 2/2 above RASS 0, not on sedation. Able to follow simple commands and nod head appropriately. Tolerating PS 5/5, plan for extubation.  -PS 5/5 -extubate today  History of recurrent VTE Most recently on 01/2022. On indefinite anticoagulation with eliquis. His eliquis is currently being held due to recent abdominal surgery x2 and downtrend in Hgb (12.6 > 10.5 > 8.6). Discussed with CCS, hold anticoagulation until hgb stabilizes, hopefully can restart tomorrow. -SCDs -holding eliquis, consider restarting tomorrow if hgb stabilizes -- will defer to surgery -consider IVC filter placement if unable to restart anticoagulation soon  CKD 3A Baseline Cr ~1.5-1.6 per chart review. Kidney function unchanged overnight with Cr 1.8 today. Does not technically meet criteria for AKI, could possibly be progression of CKD to stage 3B.  -maintain MAPs >65 for adequate renal perfusion -avoid nephrotoxic meds -strict I/Os -trend kidney function  Type 2 NSTEMI Hx of LVH Qtc prolongation Troponins peaked in  500s. EKG with no active ischemic changes. Likely demand ischemia given severe sepsis from bowel perforation. -ECHO results pending -avoid QT prolonging meds as able  Acute on chronic anemia Acute blood loss 2/2 ex-lap Hemoglobin trending down (12.6 > 10.5 > 8.6). Folate and B12 normal. No active bleeding noted but did have back-to-back abdominal surgeries.  -trend hgb curve (H&H tonight, CBC tomorrow) -transfuse if hgb <7 and/or hemodynamic instability  Prediabetes A1c 5.7%. He is at risk for hypoglycemia given lack of oral or parenteral nutrition. Also at risk for hyperglycemia given acute stressors.  -D5-LR _0 /hr -sensitive SSI -trend CBGs  Multiple Myeloma AL Amyloidosis MM diagnosed in 2012, AL amyloidosis diagnosed in 2013. Remote history of bone marrow transplant. Treatment being held since August 2023 due to low energy. -supportive care -f/u with Dr. [name] as outpatient   Best Practice (right click and "Reselect all SmartList Selections" daily)   Diet/type: NPO  DVT prophylaxis: SCD - CCS to inform when to start [facility] GI prophylaxis:  PPI  Lines: N/A  Foley:  yes, placed 12/10 and still needed Code Status:  full code Last date of multidisciplinary goals of care discussion [daughter updated 12/10]   ATTESTATION & Lysle Rubens, MD 11/02/2022, 8:42 AM    LABS    PULMONARY Recent Labs  Lab 10/29/2022 2125 10/31/22 0236  PHART  --  7.268*  PCO2ART  --  38.0  PO2ART  --  133*  HCO3  --  17.5*  TCO2 22 19*  O2SAT  --  99    CBC Recent Labs  Lab 10/31/22 0652 10/31/22 1935 11/15/2022 0157 11/02/22 0215  HGB 12.6* 11.0* 10.5* 8.6*  HCT 37.9* 33.5* 32.5* 26.2*  WBC 2.2*  --  15.3* 7.6  PLT 182  --  143* 111*    COAGULATION Recent Labs  Lab 11/12/2022 2101  INR 1.6*    CHEMISTRY Recent Labs  Lab 11/21/2022 2101 10/24/2022 2125 10/31/22 0236 10/31/22 0652 10/23/2022 0157 11/02/22 0215  NA 139 141 141 143 141 140  K 3.5 3.5 3.6  4.0 5.2* 4.8  CL 107 105  --  110 113* 112*  CO2 20*  --   --  22 19* 21*  GLUCOSE 140* 139*  --  237* 144* 179*  BUN 23 23  --  22 29* 31*  CREATININE 1.75* 1.70*  --  1.79* 1.84* 1.82*  CALCIUM 9.1  --   --  8.5* 8.3* 8.3*  MG  --   --   --  1.7 1.8 2.2  PHOS  --   --   --  3.4 3.4 3.2   Estimated Creatinine Clearance: 37.3 mL/min (A) (by C-G formula based on SCr of 1.82 mg/dL (H)).   LIVER Recent Labs  Lab 11/07/2022 2101 11/03/2022 0157 11/02/22 0215  AST _0 ALT _1 ALKPHOS 71 34* 32*  BILITOT 0.6 0.7 0.7  PROT 5.4* 4.5* 4.1*  ALBUMIN 3.3* 2.4* 2.0*  INR 1.6*  --   --      INFECTIOUS Recent Labs  Lab 10/31/22 0652 10/31/22 1935 11/16/2022 0157  LATICACIDVEN 4.1* 2.8* 2.4*     ENDOCRINE CBG (last 3)  Recent Labs    10/27/2022 1918 11/21/2022 2324 11/02/22 0320  GLUCAP 130* 148* 154*         IMAGING x48h  - image(s) personally visualized  -   highlighted in bold No results found.

## 2022-11-02 NOTE — Progress Notes (Addendum)
1 Day Post-Op  Subjective: CC: Awake on vent. Off pressors. Not on sedation. Shakes head no to having any abdominal pain or nausea.  Afebrile overnight. WBC wnl at 7.6. Hgb 8.6 from 10.5  Seen and discussed with CCM while we were in the patients room.   Objective: Vital signs in last 24 hours: Temp:  [97.2 F (36.2 C)-98.9 F (37.2 C)] 98 F (36.7 C) (12/12 0700) Pulse Rate:  [69-100] 78 (12/12 0745) Resp:  [19-22] 20 (12/12 0745) BP: (86-119)/(49-88) 113/82 (12/12 0745) SpO2:  [97 %-100 %] 99 % (12/12 0745) FiO2 (%):  [30 %] 30 % (12/12 0400) Weight:  [89.8 kg] 89.8 kg (12/12 0500) Last BM Date :  (PTA)  Intake/Output from previous day: 12/11 0701 - 12/12 0700 In: 3643.9 [I.V.:3470.2; IV Piggyback:173.7] Out: 1045 [Urine:825; Drains:200; Blood:20] Intake/Output this shift: No intake/output data recorded.  PE: Gen:  Alert on vent Abd: Some distension but very soft. Nods head no to any ttp. No rigidity or guarding. Stoma viable. Sweat in ostomy bag. Midline wound clean with retention sutures in place. OGT in place.   Lab Results:  Recent Labs    11/17/2022 0157 11/02/22 0215  WBC 15.3* 7.6  HGB 10.5* 8.6*  HCT 32.5* 26.2*  PLT 143* 111*   BMET Recent Labs    10/27/2022 0157 11/02/22 0215  NA 141 140  K 5.2* 4.8  CL 113* 112*  CO2 19* 21*  GLUCOSE 144* 179*  BUN 29* 31*  CREATININE 1.84* 1.82*  CALCIUM 8.3* 8.3*   PT/INR Recent Labs    10/31/2022 2101  LABPROT 19.3*  INR 1.6*   CMP     Component Value Date/Time   NA 140 11/02/2022 0215   NA 140 01/29/2016 0831   K 4.8 11/02/2022 0215   K 4.4 01/29/2016 0831   CL 112 (H) 11/02/2022 0215   CL 106 03/14/2013 1304   CO2 21 (L) 11/02/2022 0215   CO2 24 01/29/2016 0831   GLUCOSE 179 (H) 11/02/2022 0215   GLUCOSE 105 01/29/2016 0831   GLUCOSE 94 03/14/2013 1304   BUN 31 (H) 11/02/2022 0215   BUN 21.5 01/29/2016 0831   CREATININE 1.82 (H) 11/02/2022 0215   CREATININE 1.54 (H) 10/21/2022 1433    CREATININE 1.3 01/29/2016 0831   CALCIUM 8.3 (L) 11/02/2022 0215   CALCIUM 9.1 01/29/2016 0831   PROT 4.1 (L) 11/02/2022 0215   PROT 6.1 01/29/2016 0831   PROT 6.5 01/29/2016 0831   ALBUMIN 2.0 (L) 11/02/2022 0215   ALBUMIN 3.5 01/29/2016 0831   AST 15 11/02/2022 0215   AST 13 (L) 10/21/2022 1433   AST 16 01/29/2016 0831   ALT 11 11/02/2022 0215   ALT 9 10/21/2022 1433   ALT 10 01/29/2016 0831   ALKPHOS 32 (L) 11/02/2022 0215   ALKPHOS 92 01/29/2016 0831   BILITOT 0.7 11/02/2022 0215   BILITOT 0.4 10/21/2022 1433   BILITOT 0.41 01/29/2016 0831   GFRNONAA 38 (L) 11/02/2022 0215   GFRNONAA 46 (L) 10/21/2022 1433   GFRAA >60 04/24/2019 0906   Lipase     Component Value Date/Time   LIPASE 10 (L) 10/28/2012 2253    Studies/Results: No results found.  Anti-infectives: Anti-infectives (From admission, onward)    Start     Dose/Rate Route Frequency Ordered Stop   10/31/22 0400  piperacillin-tazobactam (ZOSYN) IVPB 3.375 g        3.375 g 12.5 mL/hr over 240 Minutes Intravenous Every 8 hours 11/09/2022 2330  10/27/2022 2130  piperacillin-tazobactam (ZOSYN) IVPB 3.375 g        3.375 g 100 mL/hr over 30 Minutes Intravenous  Once 11/19/2022 2120 10/31/2022 2221        Assessment/Plan POD 2 s/p Ex lap, transverse colectomy and placement of abthera wound vac for bowel perforation, feculent peritonitis, mesenteric ischemia by Dr. Redmond Pulling on 10/31/22 POD 1 s/p ex lap, creation of colostomy and abdominal closure including retention sutures on 12/11 by Dr. Grandville Silos - Intra-Op Findings from 12/10: significant stool contamination, old periumbilical mesh, omentum and colonic tissue very friable and essentially fell apart, silk sutures marking proximal transverse & distal transverse colon  - WTD BID between retention sutures - showed this to RN at bedside.  - Cont abx - Appreciate CCM's assistance. Discussed with their team at bedside.     FEN - NPO/OGT while intubated. If extubated today  can have CLD  VTE - SCDs, On Eliquis at baseline. Andexxa pre-op. Would hold anticoagulation till hgb stable with how oozy he was intra-op. Hopefully can start heparin gtt with no bolus tomorrow if hgb is stable. Discussed with CCM.  ID - Zosyn. Afebrile. WBC wnl.  Foley - In place, monitor I/O. Can d/c when extubated from our standpoint.   - Per CCM -  AKI on CKD3A Amyloidosis  History of recurrent VTE: Most recently DVT in 01/2022 NSTEMI ABL anemia    LOS: 3 days    [name] , [facility] 11/02/2022, 8:02 AM Please see Amion for pager number during day hours 7:00am-4:30pm

## 2022-11-02 NOTE — Procedures (Signed)
Extubation Procedure Note  Patient Details:   Name: Brent Allison DOB: 08/09/1946 MRN: [id number]   Airway Documentation:    Vent end date: 11/02/22 Vent end time: 0952   Evaluation  O2 sats: stable throughout Complications: No apparent complications Patient did tolerate procedure well. Bilateral Breath Sounds: Clear, Diminished   Pt extubated to 2L [name] per MD order. Pt had positive cuff leak prior to extubation. No stridor noted post extubation. Pt able to voice his name.  Vilinda Blanks 11/02/2022, 9:52 AM

## 2022-11-03 DIAGNOSIS — R6521 Severe sepsis with septic shock: Secondary | ICD-10-CM | POA: Diagnosis not present

## 2022-11-03 DIAGNOSIS — E440 Moderate protein-calorie malnutrition: Secondary | ICD-10-CM | POA: Diagnosis not present

## 2022-11-03 DIAGNOSIS — K668 Other specified disorders of peritoneum: Secondary | ICD-10-CM | POA: Diagnosis not present

## 2022-11-03 DIAGNOSIS — A419 Sepsis, unspecified organism: Secondary | ICD-10-CM | POA: Diagnosis not present

## 2022-11-03 DIAGNOSIS — R198 Other specified symptoms and signs involving the digestive system and abdomen: Secondary | ICD-10-CM | POA: Diagnosis not present

## 2022-11-03 LAB — CBC
HCT: 24.9 % — ABNORMAL LOW (ref 39.0–52.0)
Hemoglobin: 8.4 g/dL — ABNORMAL LOW (ref 13.0–17.0)
MCH: 32.9 pg (ref 26.0–34.0)
MCHC: 33.7 g/dL (ref 30.0–36.0)
MCV: 97.6 fL (ref 80.0–100.0)
Platelets: 102 10*3/uL — ABNORMAL LOW (ref 150–400)
RBC: 2.55 MIL/uL — ABNORMAL LOW (ref 4.22–5.81)
RDW: 14.4 % (ref 11.5–15.5)
WBC: 9.8 10*3/uL (ref 4.0–10.5)
nRBC: 0 % (ref 0.0–0.2)

## 2022-11-03 LAB — BASIC METABOLIC PANEL
Anion gap: 7 (ref 5–15)
BUN: 33 mg/dL — ABNORMAL HIGH (ref 8–23)
CO2: 22 mmol/L (ref 22–32)
Calcium: 8.3 mg/dL — ABNORMAL LOW (ref 8.9–10.3)
Chloride: 111 mmol/L (ref 98–111)
Creatinine, Ser: 1.69 mg/dL — ABNORMAL HIGH (ref 0.61–1.24)
GFR, Estimated: 41 mL/min — ABNORMAL LOW (ref >60)
Glucose, Bld: 130 mg/dL — ABNORMAL HIGH (ref 70–99)
Potassium: 4.3 mmol/L (ref 3.5–5.1)
Sodium: 140 mmol/L (ref 135–145)

## 2022-11-03 LAB — GLUCOSE, CAPILLARY
Glucose-Capillary: 125 mg/dL — ABNORMAL HIGH (ref 70–99)
Glucose-Capillary: 95 mg/dL (ref 70–99)
Glucose-Capillary: 97 mg/dL (ref 70–99)
Glucose-Capillary: 105 mg/dL — ABNORMAL HIGH (ref 70–99)
Glucose-Capillary: 117 mg/dL — ABNORMAL HIGH (ref 70–99)
Glucose-Capillary: 104 mg/dL — ABNORMAL HIGH (ref 70–99)

## 2022-11-03 LAB — APTT
aPTT: 60 seconds — ABNORMAL HIGH (ref 24–36)
aPTT: 69 seconds — ABNORMAL HIGH (ref 24–36)

## 2022-11-03 MED ORDER — PROSOURCE PLUS PO LIQD
30.0000 mL | Freq: Three times a day (TID) | ORAL | Status: DC
  Filled 2022-11-03 (×2): qty 30

## 2022-11-03 MED ORDER — CHLORPROMAZINE HCL 25 MG PO TABS
25.0000 mg | ORAL_TABLET | Freq: Three times a day (TID) | ORAL | Status: DC | PRN
  Administered 2022-11-03: 25 mg via ORAL
  Filled 2022-11-03 (×3): qty 1

## 2022-11-03 MED ORDER — ONDANSETRON HCL 4 MG/2ML IJ SOLN
4.0000 mg | Freq: Three times a day (TID) | INTRAMUSCULAR | Status: DC | PRN
  Administered 2022-11-03 – 2022-11-05 (×2): 4 mg via INTRAVENOUS
  Filled 2022-11-03 (×3): qty 2

## 2022-11-03 MED ORDER — ADULT MULTIVITAMIN W/MINERALS CH
1.0000 | ORAL_TABLET | Freq: Every day | ORAL | Status: DC
  Administered 2022-11-03: 1 via ORAL
  Filled 2022-11-03: qty 1

## 2022-11-03 MED ORDER — ARGATROBAN 50 MG/50ML IV SOLN
0.6000 ug/kg/min | INTRAVENOUS | Status: DC
  Administered 2022-11-03 – 2022-11-05 (×4): 0.5 ug/kg/min via INTRAVENOUS
  Administered 2022-11-08: 0.6 ug/kg/min via INTRAVENOUS
  Filled 2022-11-03 (×5): qty 50

## 2022-11-03 MED ORDER — BOOST / RESOURCE BREEZE PO LIQD CUSTOM
1.0000 | Freq: Three times a day (TID) | ORAL | Status: DC
  Administered 2022-11-03 – 2022-11-07 (×6): 1 via ORAL

## 2022-11-03 NOTE — Progress Notes (Signed)
NAME:  Brent Allison, MRN:  [id number], DOB:  31-Dec-1945, LOS: 4 ADMISSION DATE:  11/04/2022, CONSULTATION DATE:  11/03/22 REFERRING MD:  Dr. Alvino Chapel ED, CHIEF COMPLAINT: Abdominal pain  History of Present Illness:  76 year old with known history of below presents with acute onset of abdominal pain about 7:30 PM, peritoneal exam, found to have viscus perforation on CT of the abdomen.  Acute onset of pain.  CT scan revealed intestinal perforation.  He takes Eliquis for VTE history.  Last dose this morning.  Received Andexxa in the ED for reversal.  Surgery was consulted.  Taken to the OR.  They recommended medical ICU admission versus surgical admission given complex medical history.  Lab notable for mild elevation in creatinine, AKI, mild leukocytosis with left shift, hemoglobin appears at baseline.  Blood cultures were obtained.  Pertinent  Medical History  History of amyloidosis, history of diastolic dysfunction although euvolemic on exam, history of DVT  Significant Hospital Events: Including procedures, antibiotic start and stop dates in addition to other pertinent events   11/05/2022 presents with acute onset abdominal pain found to have abdominal perforation 10/31/22  - Surgery : POD 0 s/p Ex lap, transverse colectomy and placement of abthera wound vac for bowel perforation, feculent peritonitis, mesenteric ischemia by Dr. Redmond Pulling on 10/31/22. - Intra-Op Findings: significant stool contamination, old periumbilical mesh, omentum and colonic tissue very friable and essentially fell apart, silk sutures marking proximal transverse & distal transverse colon.  On Eliquis at baseline. Andexxa pre-op. T  11/11/2022 - surgery: POD 0 s/p ex-lap, creation of colostomy and abdominal closure with retention sutures by Dr. Grandville Silos; holding [facility]. Tolerating PS 5 over 5, extubate.  Interim History / Subjective:   Stable overnight. Extubated yesterday, has since tolerated 2L [place] with good saturations.  Remains afebrile with MAPs >65 without needing pressors. No leukocytosis and hemoglobin appears to have stabilized. Kidney function likely near baseline now. Discussed with CCS, okay to start anticoagulation. Will use argotroban given history of HIT (SRA +).   Objective   Blood pressure (!) 110/57, pulse 90, temperature 97.9 F (36.6 C), temperature source Oral, resp. rate 19, height 6' (1.829 m), weight 82.3 kg, SpO2 95 %.        Intake/Output Summary (Last 24 hours) at 11/03/2022 1336 Last data filed at 11/03/2022 1300 Gross per 24 hour  Intake 3078.1 ml  Output 1050 ml  Net 2028.1 ml   Filed Weights   10/29/2022 0500 11/02/22 0500 11/03/22 0500  Weight: 89.8 kg 89.8 kg 82.3 kg    Examination: General: elderly male, sitting up in bed, NAD. Eyes: PERRL, EOMI. CV: normal rate and regular rhythm, no m/r/g.  Pulm: CTABL, no adventitious sounds noted. Saturating well on 2L [place]. No respiratory distress. Abdomen: soft, mildly distended, non-tender. Ostomy site with minimal output. Retention sutures in place.  Extremities: warm and well perfused, no peripheral edema. Neuro: AAOx3, no focal deficits.    Resolved Hospital Problem list   Lactic acidosis - present on admission - resolved 12/12  Assessment & Plan:  Severe sepsis secondary to bowel perforation, feculent peritonitis, mesenteric ischemia - present on admission S/p ex-lap with transverse colectomy and placement of abthera wound vac (Dr. Redmond Pulling, 10/31/2022) S/p ex-lap with creation of colostomy and abdominal closure with retention sutures (Dr. Grandville Silos 11/20/2022) POD 3 following transverse colectomy and POD 2 following creation of colostomy and abdominal closure with retention sutures. Remains afebrile and without leukocytosis. HDS, no pressor or sedation requirements. Pain control with dilaudid  prn. Tolerating CLD, continue for now per CCS. -continue zosyn (last day through 12/15) -maintain MAP goal >65 -continue  CLD -dilaudid prn for pain control -blood cultures no growth thus far -WOC for ostomy care -transfer out of ICU today, Dr. Cathlean Sauer [facility]) to take over care tomorrow at 7AM  Acute hypoxic respiratory failure 2/2 above Extubated 11/02/2022. Tolerating 2L [place] with good saturations.  -wean as able to maintain O2 sats >90%  History of recurrent VTE Most recently on 01/2022. On indefinite anticoagulation with eliquis. Hemoglobin appears to have stabilized. Discussed with CCS, plan to start argotroban today. Avoid heparin given history of HIT (SRA +).  -SCDs -starting argatroban -closely trend hgb curve  CKD 3A Baseline Cr ~1.5-1.6 per chart review. Kidney function improved to Cr 1.69, likely near baseline. -maintain MAPs >65 for adequate renal perfusion -avoid nephrotoxic meds -strict I/Os -trend kidney function  Type 2 NSTEMI Hx of LVH QTc prolongation Troponins peaked in 500s. EKG with no active ischemic changes. Likely demand ischemia given severe sepsis from bowel perforation. -ECHO results pending -avoid QT prolonging meds as able  Acute on chronic anemia Acute blood loss 2/2 ex-lap Hemoglobin appears to have stabilized (8.6 > 8.4). No active bleeding noted. -trend hgb curve -transfuse if hgb <7 and/or hemodynamic instability  Prediabetes A1c 5.7%. CBGs well controlled with current regimen. -D5-LR _0 /hr -sensitive SSI -trend CBGs  Multiple Myeloma AL Amyloidosis MM diagnosed in 2012, AL amyloidosis diagnosed in 2013. Remote history of bone marrow transplant. Treatment being held since August 2023 due to low energy. -supportive care -f/u with Dr. [name] as outpatient   Best Practice (right click and "Reselect all SmartList Selections" daily)   Diet/type: CLD DVT prophylaxis: argatroban GI prophylaxis:  PPI  Lines: N/A  Foley:  no  Code Status:  full code Last date of multidisciplinary goals of care discussion [daughter updated 12/13]   ATTESTATION &  SIGNATURE   Virl Axe, MD [facility], PGY-3 11/03/2022, 1:58 PM     LABS    PULMONARY Recent Labs  Lab 11/05/2022 2125 10/31/22 0236  PHART  --  7.268*  PCO2ART  --  38.0  PO2ART  --  133*  HCO3  --  17.5*  TCO2 22 19*  O2SAT  --  99    CBC Recent Labs  Lab 10/31/2022 0157 11/02/22 0215 11/02/22 1808 11/03/22 0316  HGB 10.5* 8.6* 8.8* 8.4*  HCT 32.5* 26.2* 27.1* 24.9*  WBC 15.3* 7.6  --  9.8  PLT 143* 111*  --  102*    COAGULATION Recent Labs  Lab 10/28/2022 2101  INR 1.6*    CHEMISTRY Recent Labs  Lab 10/22/2022 2101 11/11/2022 2125 10/31/22 0236 10/31/22 0652 11/05/2022 0157 11/02/22 0215 11/03/22 0316  NA 139 141 141 143 141 140 140  K 3.5 3.5 3.6 4.0 5.2* 4.8 4.3  CL 107 105  --  110 113* 112* 111  CO2 20*  --   --  22 19* 21* 22  GLUCOSE 140* 139*  --  237* 144* 179* 130*  BUN 23 23  --  22 29* 31* 33*  CREATININE 1.75* 1.70*  --  1.79* 1.84* 1.82* 1.69*  CALCIUM 9.1  --   --  8.5* 8.3* 8.3* 8.3*  MG  --   --   --  1.7 1.8 2.2  --   PHOS  --   --   --  3.4 3.4 3.2  --    Estimated Creatinine Clearance: 40.2 mL/min (A) (by  C-G formula based on SCr of 1.69 mg/dL (H)).   LIVER Recent Labs  Lab 11/15/2022 2101 10/31/2022 0157 11/02/22 0215  AST _0 ALT _1 ALKPHOS 71 34* 32*  BILITOT 0.6 0.7 0.7  PROT 5.4* 4.5* 4.1*  ALBUMIN 3.3* 2.4* 2.0*  INR 1.6*  --   --      INFECTIOUS Recent Labs  Lab 10/31/22 1935 11/03/2022 0157 11/02/22 0753  LATICACIDVEN 2.8* 2.4* 1.6     ENDOCRINE CBG (last 3)  Recent Labs    11/03/22 0327 11/03/22 0745 11/03/22 1131  GLUCAP 125* 95 97     IMAGING x48h  - image(s) personally visualized  -   highlighted in bold No results found.

## 2022-11-03 NOTE — Progress Notes (Signed)
Patient ID: Brent Allison, male   DOB: 1946/04/23, 76 y.o.   MRN: [id number] 2 Days Post-Op    Subjective: Tolerated some clears ROS negative except as listed above. Objective: Vital signs in last 24 hours: Temp:  [97.4 F (36.3 C)-98.3 F (36.8 C)] 98.1 F (36.7 C) (12/13 0700) Pulse Rate:  [78-107] 82 (12/13 0700) Resp:  [12-22] 17 (12/13 0700) BP: (99-147)/(50-85) 113/58 (12/13 0700) SpO2:  [90 %-100 %] 90 % (12/13 0700) FiO2 (%):  [30 %] 30 % (12/12 0824) Weight:  [82.3 kg] 82.3 kg (12/13 0500) Last BM Date :  (PTA)  Intake/Output from previous day: 12/12 0701 - 12/13 0700 In: 2952 [P.O.:440; I.V.:2357.6; IV Piggyback:154.4] Out: 1230 [Urine:1230] Intake/Output this shift: No intake/output data recorded.  General appearance: alert and cooperative Resp: clear to auscultation bilaterally GI: soft, wound intact with retentions, ostomy dark pink with min output  Lab Results: CBC  Recent Labs    11/02/22 0215 11/02/22 1808 11/03/22 0316  WBC 7.6  --  9.8  HGB 8.6* 8.8* 8.4*  HCT 26.2* 27.1* 24.9*  PLT 111*  --  102*   BMET Recent Labs    11/02/22 0215 11/03/22 0316  NA 140 140  K 4.8 4.3  CL 112* 111  CO2 21* 22  GLUCOSE 179* 130*  BUN 31* 33*  CREATININE 1.82* 1.69*  CALCIUM 8.3* 8.3*   PT/INR No results for input(s): "LABPROT", "INR" in the last 72 hours. ABG No results for input(s): "PHART", "HCO3" in the last 72 hours.  Invalid input(s): "PCO2", "PO2"  Studies/Results: No results found.  Anti-infectives: Anti-infectives (From admission, onward)    Start     Dose/Rate Route Frequency Ordered Stop   11/02/22 1200  piperacillin-tazobactam (ZOSYN) IVPB 3.375 g        3.375 g 12.5 mL/hr over 240 Minutes Intravenous Every 8 hours 11/02/22 0942 11/06/22 0359   10/31/22 0400  piperacillin-tazobactam (ZOSYN) IVPB 3.375 g  Status:  Discontinued        3.375 g 12.5 mL/hr over 240 Minutes Intravenous Every 8 hours 11/06/2022 2330 11/02/22 0942    11/05/2022 2130  piperacillin-tazobactam (ZOSYN) IVPB 3.375 g        3.375 g 100 mL/hr over 30 Minutes Intravenous  Once 11/21/2022 2120 11/16/2022 2221       Assessment/Plan: POD 3 s/p Ex lap, transverse colectomy and placement of abthera wound vac for bowel perforation, feculent peritonitis, mesenteric ischemia by Dr. Redmond Pulling on 10/31/22 POD 2 s/p ex lap, creation of colostomy and abdominal closure including retention sutures on 12/11 by Dr. Grandville Silos - Intra-Op Findings from 12/10: significant stool contamination, old periumbilical mesh, omentum and colonic tissue very friable and essentially fell apart, silk sutures marking proximal transverse & distal transverse colon  - clears, AROBF - WTD BID between retention sutures - Cont abx - Appreciate CCM's assistance. Discussed with their team on the unit - OK to start heparin drip without bolus   FEN - clears VTE - SCDs, On Eliquis at baseline. Heparin drip without bolus today ID - Zosyn. Afebrile. WBC wnl.  Foley - In place, monitor I/O. Can d/c when extubated from our standpoint.   - Per CCM -  AKI on CKD3A Amyloidosis  History of recurrent VTE: Most recently DVT in 01/2022 NSTEMI ABL anemia   LOS: 4 days    Georganna Skeans, MD, MPH, FACS Trauma & General Surgery Use [email or web address] to contact on call provider  11/03/2022

## 2022-11-03 NOTE — Progress Notes (Signed)
Nutrition Follow-up  DOCUMENTATION CODES:  Non-severe (moderate) malnutrition in context of chronic illness  INTERVENTION:  Continue current diet as ordered, enourage PO intake, advance as tolerated Boost Breeze po TID, each supplement provides 250 kcal and 9 grams of protein Prosource Plus TID, each supplement provides 100kcal and 15g of protein MVI with minerals daily  NUTRITION DIAGNOSIS:  Moderate Malnutrition related to chronic illness (amyloidosis, CHF, CKD IIIa) as evidenced by mild fat depletion, moderate muscle depletion. - remains applicable  GOAL:  Patient will meet greater than or equal to 90% of their needs -progressing  MONITOR:  Labs, Vent status, Weight trends, Skin, I & O's  REASON FOR ASSESSMENT:  Ventilator    ASSESSMENT:  76 year old male who presented to the ED on 12/09 with abdominal pain. 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 Pt admitted with severe sepsis with abdominal perforation, feculent peritonitis, mesenteric ischemia, AKI.  12/9 - s/p ex lap, transverse colectomy, placement of abthera wound VAC (abdomen open), returned to ICU on vent  12/11 - Exploratory laparotomy with abdominal closure  12/12 - Extubation  Pt sleeping soundly at the time of assessment. Able to be extubated yesterday and clears initiated. Surgery added Boost Breeze this AM to augment intake. Will also add prosource plus to provide a source of protein into pt's diet. Will exchange supplements for more calorically/protein dense options once diet advanced to full liquids.     Intake/Output Summary (Last 24 hours) at 11/03/2022 1310 Last data filed at 11/03/2022 1300 Gross per 24 hour  Intake 3078.1 ml  Output 1050 ml  Net 2028.1 ml  Net IO Since Admission: 8,343.78 mL [11/03/22 1310]  Nutritionally Relevant Medications: Scheduled Meds:  feeding supplement  1 Container Oral TID BM   insulin aspart   0-9 Units Subcutaneous Q4H   pantoprazole IV  40 mg Intravenous Q24H   Continuous Infusions:  dextrose 5% lactated ringers 100 mL/hr at 11/03/22 0720   piperacillin-tazobactam (ZOSYN)  IV 12.5 mL/hr at 11/03/22 0600   PRN Meds: ondansetron  Labs Reviewed: BUN 33, creatinine 1.69 CBG ranges from 95-154 mg/dL over the last 24 hours  Micronutrient Labs: Folate 7.9 Vitamin B12 1,024  NUTRITION - FOCUSED PHYSICAL EXAM: Flowsheet Row Most Recent Value  Orbital Region Moderate depletion  Upper Arm Region Mild depletion  Thoracic and Lumbar Region Mild depletion  Buccal Region Unable to assess  Temple Region No depletion  Clavicle Bone Region Moderate depletion  Clavicle and Acromion Bone Region Moderate depletion  Scapular Bone Region Moderate depletion  Dorsal Hand No depletion  Patellar Region No depletion  Anterior Thigh Region Mild depletion  Posterior Calf Region Mild depletion  Edema (RD Assessment) None  Hair Reviewed  Eyes Reviewed  Mouth Reviewed  Skin Reviewed  Nails Reviewed    Diet Order:   Diet Order             Diet clear liquid Room service appropriate? Yes; Fluid consistency: Thin  Diet effective now                   EDUCATION NEEDS:  Not appropriate for education at this time  Skin:  Skin Assessment: Skin Integrity Issues: Skin Integrity Issues:: Incisions Incisions: midline, new ostomy  Last BM:  no documented BM  Height:  Ht Readings from Last 1 Encounters:  10/31/22 6' (1.829 m)    Weight:  Wt Readings from Last 1 Encounters:  11/03/22  82.3 kg    Ideal Body Weight:  80.9 kg  BMI:  Body mass index is 24.61 kg/m.  Estimated Nutritional Needs:  Kcal:  2200-2400 Protein:  120-140 grams Fluid:  >2.0 L    Ranell Patrick, RD, LDN Clinical Dietitian RD pager # available in [place]  After hours/weekend pager # available in [facility]

## 2022-11-03 NOTE — Progress Notes (Addendum)
ANTICOAGULATION CONSULT NOTE - Initial Consult  Pharmacy Consult:  Argatroban Indication: DVT  Allergies  Allergen Reactions   Enoxaparin Other (See Comments)    Blood clots HIT [SRA +]   Heparin Other (See Comments)    Heparin induced thrombocytopenia (SRA +)    Patient Measurements: Height: 6' (182.9 cm) Weight: 82.3 kg (181 lb 7 oz) IBW/kg (Calculated) : 77.6  Vital Signs: Temp: 98.1 F (36.7 C) (12/13 0700) Temp Source: Oral (12/13 0700) BP: 113/58 (12/13 0700) Pulse Rate: 82 (12/13 0700)  Labs: Recent Labs    11/06/2022 0157 11/02/2022 0643 11/18/2022 0827 11/02/22 0215 11/02/22 1808 11/03/22 0316  HGB 10.5*  --   --  8.6* 8.8* 8.4*  HCT 32.5*  --   --  26.2* 27.1* 24.9*  PLT 143*  --   --  111*  --  102*  CREATININE 1.84*  --   --  1.82*  --  1.69*  TROPONINIHS 547* 469* 453*  --   --   --     Estimated Creatinine Clearance: 40.2 mL/min (A) (by C-G formula based on SCr of 1.69 mg/dL (H)).   Medical History: Past Medical History:  Diagnosis Date   AL amyloidosis ([place]) 11/04/2012   Arthritis    lumbar DDD   Blood dyscrasia    plasma cell dyscrasia with associated amyloidosis   CKD (chronic kidney disease)    DVT (deep venous thrombosis) (HCC)    right   Dyslipidemia    H/O multiple myeloma    Heparin induced thrombocytopenia (HIT) ([place]) 11/04/2012   Joint pain    Peripheral vascular disease ([place])    DVT- 01/2012, follows by Dr. [name], lovenox maintained since Spring 2013 , pt. aware that last dose is sch. for 07/22/2012   Pneumonia    hx of with last time in Nov 2012   Prostate cancer [facility])    amyloidosis, multiple myeloma    Sleep apnea    severe with AHI 44.4 events per hour - now on CPAP at 11cm H2O   Spastic quadriparesis [facility])      Assessment: 17 YOM with history of DVT in March 2023 on Eliquis PTA.  Patient admitted with a perforated bowel requiring reversal for surgery.  Now stable and Pharmacy consulted to dose argatroban given history  of HIT.  Patient has ABLA - H/H down to 8.4/24.9 and platelet count also trending down.  Goal of Therapy:  aPTT 50-90 seconds Monitor platelets by anticoagulation protocol: Yes   Plan:  Start argatroban at 0.5 mcg/kg/hr Check 4 hr aPTT Daily aPTT and CBC Monitor closely for s/sx of bleeding  Marita Burnsed D. Mina Marble, PharmD, BCPS, [place] 11/03/2022, 8:36 AM   ==============================  Addendum: 4-hr aPTT is therapeutic at 60 sec; no issues reported Continue argatroban at 0.5 mcg/kg/hr Repeat aPTT in 4 hrs per protocol   Zayed Griffie D. Mina Marble, PharmD, BCPS, [place] 11/03/2022, 3:10 PM

## 2022-11-03 NOTE — Progress Notes (Signed)
Refused cpap.

## 2022-11-03 NOTE — Anesthesia Postprocedure Evaluation (Signed)
Anesthesia Post Note  Patient: Brent Allison  Procedure(s) Performed: EXPLORATORY LAPAROTOMY, TRANSVERSE COLECTOMY, PLACEMENT OF ABTHERA WOUND VAC     Patient location during evaluation: ICU Anesthesia Type: General Level of consciousness: patient cooperative and oriented (resting comfortably) Pain management: pain level controlled Vital Signs Assessment: post-procedure vital signs reviewed and stable Respiratory status: spontaneous breathing, nonlabored ventilation, respiratory function stable and patient connected to nasal cannula oxygen Cardiovascular status: blood pressure returned to baseline and stable : nausea has returned now that diet is advancing. Anesthetic complications: no   No notable events documented.  Last Vitals:  Vitals:   11/03/22 1955 11/03/22 2000  BP:  (!) 132/59  Pulse:  90  Resp:  18  Temp: 37 C   SpO2:  96%    Last Pain:  Vitals:   11/03/22 2000  TempSrc:   PainSc: 0-No pain                 Arizbeth Cawthorn,E. Patsy Varma

## 2022-11-03 NOTE — Progress Notes (Signed)
ANTICOAGULATION CONSULT NOTE - Initial Consult  Pharmacy Consult:  Argatroban Indication: DVT  Allergies  Allergen Reactions   Enoxaparin Other (See Comments)    Blood clots HIT [SRA +]   Heparin Other (See Comments)    Heparin induced thrombocytopenia (SRA +)    Patient Measurements: Height: 6' (182.9 cm) Weight: 82.3 kg (181 lb 7 oz) IBW/kg (Calculated) : 77.6  Vital Signs: Temp: 98 F (36.7 C) (12/13 1600) Temp Source: Oral (12/13 1600) BP: 131/58 (12/13 1800) Pulse Rate: 99 (12/13 1800)  Labs: Recent Labs    11/19/2022 0157 10/25/2022 0643 11/05/2022 0827 11/02/22 0215 11/02/22 1808 11/03/22 0316 11/03/22 1347 11/03/22 1752  HGB 10.5*  --   --  8.6* 8.8* 8.4*  --   --   HCT 32.5*  --   --  26.2* 27.1* 24.9*  --   --   PLT 143*  --   --  111*  --  102*  --   --   APTT  --   --   --   --   --   --  60* 69*  CREATININE 1.84*  --   --  1.82*  --  1.69*  --   --   TROPONINIHS 547* 469* 453*  --   --   --   --   --      Estimated Creatinine Clearance: 40.2 mL/min (A) (by C-G formula based on SCr of 1.69 mg/dL (H)).   Medical History: Past Medical History:  Diagnosis Date   [place] amyloidosis ([place]) 11/04/2012   Arthritis    lumbar DDD   Blood dyscrasia    plasma cell dyscrasia with associated amyloidosis   CKD (chronic kidney disease)    DVT (deep venous thrombosis) (HCC)    right   Dyslipidemia    H/O multiple myeloma    Heparin induced thrombocytopenia (HIT) ([place]) 11/04/2012   Joint pain    Peripheral vascular disease ([place])    DVT- 01/2012, follows by Dr. [name], lovenox maintained since Spring 2013 , pt. aware that last dose is sch. for 07/22/2012   Pneumonia    hx of with last time in Nov 2012   Prostate cancer [facility])    amyloidosis, multiple myeloma    Sleep apnea    severe with AHI 44.4 events per hour - now on CPAP at 11cm H2O   Spastic quadriparesis [facility])      Assessment: 25 YOM with history of DVT in March 2023 on Eliquis PTA.  Patient admitted  with a perforated bowel requiring reversal for surgery.  Now stable and Pharmacy consulted to dose argatroban given history of HIT.  Confirmatory aPTT remains therapeutic at 69 seconds.  Goal of Therapy:  aPTT 50-90 seconds Monitor platelets by anticoagulation protocol: Yes   Plan:  Continue argatroban at 0.5 mcg/kg/hr Daily aPTT, CBC  Arrie Senate, PharmD, BCPS, [facility] Clinical Pharmacist Please check AMION for all [facility] Pharm[facility] numbers 11/03/2022

## 2022-11-04 ENCOUNTER — Inpatient Hospital Stay: Payer: Self-pay

## 2022-11-04 LAB — CBC
HCT: 26.6 % — ABNORMAL LOW (ref 39.0–52.0)
Hemoglobin: 8.8 g/dL — ABNORMAL LOW (ref 13.0–17.0)
MCH: 32.5 pg (ref 26.0–34.0)
MCHC: 33.1 g/dL (ref 30.0–36.0)
MCV: 98.2 fL (ref 80.0–100.0)
Platelets: 120 10*3/uL — ABNORMAL LOW (ref 150–400)
RBC: 2.71 MIL/uL — ABNORMAL LOW (ref 4.22–5.81)
RDW: 14.3 % (ref 11.5–15.5)
WBC: 10.3 10*3/uL (ref 4.0–10.5)
nRBC: 0 % (ref 0.0–0.2)

## 2022-11-04 LAB — TYPE AND SCREEN
ABO/RH(D): A POS
Antibody Screen: POSITIVE
Donor AG Type: NEGATIVE
Donor AG Type: NEGATIVE
Unit division: 0
Unit division: 0
Unit division: 0
Unit division: 0

## 2022-11-04 LAB — BASIC METABOLIC PANEL
Anion gap: 6 (ref 5–15)
BUN: 30 mg/dL — ABNORMAL HIGH (ref 8–23)
CO2: 22 mmol/L (ref 22–32)
Calcium: 8.4 mg/dL — ABNORMAL LOW (ref 8.9–10.3)
Chloride: 115 mmol/L — ABNORMAL HIGH (ref 98–111)
Creatinine, Ser: 1.5 mg/dL — ABNORMAL HIGH (ref 0.61–1.24)
GFR, Estimated: 48 mL/min — ABNORMAL LOW (ref >60)
Glucose, Bld: 123 mg/dL — ABNORMAL HIGH (ref 70–99)
Potassium: 4.1 mmol/L (ref 3.5–5.1)
Sodium: 143 mmol/L (ref 135–145)

## 2022-11-04 LAB — CULTURE, BLOOD (ROUTINE X 2)
Culture: NO GROWTH
Culture: NO GROWTH
Special Requests: ADEQUATE
Special Requests: ADEQUATE

## 2022-11-04 LAB — BPAM RBC
Blood Product Expiration Date: 202312292359
Blood Product Expiration Date: 202312302359
Blood Product Expiration Date: 202401072359
Blood Product Expiration Date: 202401072359
Unit Type and Rh: 6200
Unit Type and Rh: 6200
Unit Type and Rh: 6200
Unit Type and Rh: 6200

## 2022-11-04 LAB — GLUCOSE, CAPILLARY
Glucose-Capillary: 109 mg/dL — ABNORMAL HIGH (ref 70–99)
Glucose-Capillary: 121 mg/dL — ABNORMAL HIGH (ref 70–99)
Glucose-Capillary: 124 mg/dL — ABNORMAL HIGH (ref 70–99)
Glucose-Capillary: 124 mg/dL — ABNORMAL HIGH (ref 70–99)
Glucose-Capillary: 98 mg/dL (ref 70–99)
Glucose-Capillary: 126 mg/dL — ABNORMAL HIGH (ref 70–99)
Glucose-Capillary: 133 mg/dL — ABNORMAL HIGH (ref 70–99)

## 2022-11-04 LAB — APTT: aPTT: 66 seconds — ABNORMAL HIGH (ref 24–36)

## 2022-11-04 MED ORDER — OXYCODONE HCL 5 MG PO TABS
5.0000 mg | ORAL_TABLET | ORAL | Status: DC | PRN
  Administered 2022-11-05 – 2022-11-07 (×2): 10 mg via ORAL
  Filled 2022-11-04 (×2): qty 2

## 2022-11-04 MED ORDER — TRACE MINERALS CU-MN-SE-ZN 300-55-60-3000 MCG/ML IV SOLN
INTRAVENOUS | Status: AC
  Filled 2022-11-04: qty 409.6

## 2022-11-04 MED ORDER — CHLORHEXIDINE GLUCONATE CLOTH 2 % EX PADS
6.0000 | MEDICATED_PAD | Freq: Every day | CUTANEOUS | Status: DC
  Administered 2022-11-05 – 2022-11-13 (×10): 6 via TOPICAL

## 2022-11-04 MED ORDER — ACETAMINOPHEN 500 MG PO TABS: 1000.0000 mg | ORAL_TABLET | Freq: Four times a day (QID) | ORAL | Status: DC | PRN

## 2022-11-04 MED ORDER — SODIUM CHLORIDE 0.9% FLUSH
10.0000 mL | INTRAVENOUS | Status: DC | PRN
  Administered 2022-11-05: 10 mL
  Administered 2022-11-05 – 2022-11-09 (×2): 20 mL

## 2022-11-04 MED ORDER — SODIUM CHLORIDE 0.9% FLUSH
10.0000 mL | Freq: Two times a day (BID) | INTRAVENOUS | Status: DC
  Administered 2022-11-04 – 2022-11-05 (×4): 10 mL
  Administered 2022-11-06: 20 mL
  Administered 2022-11-07: 40 mL
  Administered 2022-11-08 – 2022-11-09 (×2): 10 mL
  Administered 2022-11-10 – 2022-11-11 (×2): 20 mL
  Administered 2022-11-11 – 2022-11-13 (×4): 10 mL

## 2022-11-04 MED ORDER — DEXTROSE-NACL 5-0.9 % IV SOLN: INTRAVENOUS | Status: DC

## 2022-11-04 MED ORDER — PROCHLORPERAZINE EDISYLATE 10 MG/2ML IJ SOLN
10.0000 mg | Freq: Four times a day (QID) | INTRAMUSCULAR | Status: DC | PRN
  Administered 2022-11-04: 10 mg via INTRAVENOUS
  Filled 2022-11-04 (×2): qty 2

## 2022-11-04 NOTE — Progress Notes (Signed)
Patient ID: Brent Allison, male   DOB: 1946/05/28, 76 y.o.   MRN: [id number] 3 Days Post-Op    Subjective: Quite nauseated, no emesis yet ROS negative except as listed above. Objective: Vital signs in last 24 hours: Temp:  [97.9 F (36.6 C)-98.7 F (37.1 C)] 98.7 F (37.1 C) (12/14 0723) Pulse Rate:  [80-100] 100 (12/14 0723) Resp:  [16-27] 20 (12/14 0723) BP: (110-155)/(57-94) 129/94 (12/14 0723) SpO2:  [90 %-97 %] 93 % (12/14 0723) Weight:  [96.8 kg-96.9 kg] 96.8 kg (12/14 0500) Last BM Date :  (PTA)  Intake/Output from previous day: 12/13 0701 - 12/14 0700 In: 3363.2 [P.O.:720; I.V.:2441.3; IV Piggyback:201.8] Out: 1850 [Urine:1850] Intake/Output this shift: No intake/output data recorded.  General appearance: alert and cooperative Resp: clear to auscultation bilaterally GI: distended, wound OK, ostomy purple-ish, not much output  Lab Results: CBC  Recent Labs    11/03/22 0316 11/04/22 0026  WBC 9.8 10.3  HGB 8.4* 8.8*  HCT 24.9* 26.6*  PLT 102* 120*   BMET Recent Labs    11/03/22 0316 11/04/22 0026  NA 140 143  K 4.3 4.1  CL 111 115*  CO2 22 22  GLUCOSE 130* 123*  BUN 33* 30*  CREATININE 1.69* 1.50*  CALCIUM 8.3* 8.4*   PT/INR No results for input(s): "LABPROT", "INR" in the last 72 hours. ABG No results for input(s): "PHART", "HCO3" in the last 72 hours.  Invalid input(s): "PCO2", "PO2"  Studies/Results: No results found.  Anti-infectives: Anti-infectives (From admission, onward)    Start     Dose/Rate Route Frequency Ordered Stop   11/02/22 1200  piperacillin-tazobactam (ZOSYN) IVPB 3.375 g        3.375 g 12.5 mL/hr over 240 Minutes Intravenous Every 8 hours 11/02/22 0942 11/06/22 0359   10/31/22 0400  piperacillin-tazobactam (ZOSYN) IVPB 3.375 g  Status:  Discontinued        3.375 g 12.5 mL/hr over 240 Minutes Intravenous Every 8 hours 11/20/2022 2330 11/02/22 0942   11/06/2022 2130  piperacillin-tazobactam (ZOSYN) IVPB 3.375 g         3.375 g 100 mL/hr over 30 Minutes Intravenous  Once 10/26/2022 2120 11/21/2022 2221       Assessment/Plan: POD 4 s/p Ex lap, transverse colectomy and placement of abthera wound vac for bowel perforation, feculent peritonitis, mesenteric ischemia by Dr. Redmond Pulling on 10/31/22 POD 3 s/p ex lap, creation of colostomy and abdominal closure including retention sutures on 12/11 by Dr. Grandville Silos - Intra-Op Findings from 12/10: significant stool contamination, old periumbilical mesh, omentum and colonic tissue very friable and essentially fell apart, silk sutures marking proximal transverse & distal transverse colon  - clears, AROBF, add compazine PRN for nausea - WTD BID between retention sutures - Cont abx - on argatroban infusion, resume DOAC when taking PO better   FEN - clears VTE - SCDs, On Eliquis at baseline. Heparin drip without bolus today ID - Zosyn (stop date 12/16) Foley - out   - Per CCM -  AKI on CKD3A Amyloidosis  History of recurrent VTE: Most recently DVT in 01/2022 NSTEMI ABL anemia   LOS: 5 days    Georganna Skeans, MD, MPH, FACS Trauma & General Surgery Use [email or web address] to contact on call provider  11/04/2022

## 2022-11-04 NOTE — Progress Notes (Signed)
PHARMACY - TOTAL PARENTERAL NUTRITION CONSULT NOTE   Indication: Prolonged ileus  Patient Measurements: Height: 6' (182.9 cm) Weight: 96.8 kg (213 lb 6.5 oz) IBW/kg (Calculated) : 77.6 TPN AdjBW (KG): 96.9 Body mass index is 28.94 kg/m.  Assessment: 76 yo male admitted on 12/9 for abdominal pain found to have peritonitis due to transverse colon perforation possibly secondary to mesenteric ischemia. S/p ex lap, creating of colostomy and abdominal closure on 12/11. Patient was extubated on 12/12 and started on a clear liquid diet, however has had minimal PO intake since admission and unable to tolerate nutritional supplements due to severe nausea. Pharmacy has been consulted to dose TPN.   +9L thus far this admit and +1 edema noted- d/w RD and will concentrate TPN.   Glucose / Insulin: hx pre-DM, A1c 5.7%. Remains on D5LR @ 100 mL/hr. CBGs range 97-123. Has sensitive SSI orders q4h - no insulin used in the past 24 hours. No DM meds PTA. Electrolytes: Na 143 (up from 140 - on D5LR), K steadily decreasing to 4.1 today as renal function improved, Cl 115, CO2 22, CoCa 10, last Mg and phos from 12/12 both wnl.  Renal: Scr improving today to 1.5, BUN improved to 30 (BL appears ~1.5-1.7) Hepatic: last labs 12/12: AST/ALT wnl, Alb low at 2, alk phos low 32, Tbili wnl at 0.7, TG 65 Intake / Output: +9L thus far; UOP 1 mL/kg/hr yesterday. No PO intake recorded today. LBM noted PTA. No ostomy output thus far, abdomen distended. MIVF: D5LR @ 100 mL/hr GI Imaging: - 12/9 CTAP: suspected pneumatosis, concern for mesenteric ischemia GI Surgeries / Procedures:  - 12/10 s/p Ex lap, transverse colectomy and placement of abthera wound vac for bowel perforation, feculent peritonitis, mesenteric ischemia - 12/11 s/p ex lap, creation of colostomy and abdominal closure including retention sutures  Central access: ordered to be placed 12/14 TPN start date: 12/14  Nutritional Goals: Goal TPN rate is 80 mL/hr  (provides 123 g of protein and 2293 kcals per day meeting 100% of needs)  RD Assessment: Estimated Needs Total Energy Estimated Needs: 2200-2400 Total Protein Estimated Needs: 120-140 grams Total Fluid Estimated Needs: >2.0 L  Current Nutrition:  Clear liquids and TPN Boost Breeze TID as tolerated (intermittently refusing d/t severe nausea)  Plan:  Start concentrated TPN at 12m/hr at 1800 (half goal TPN - will provide 61g AA and 1146 kCal meeting 50% of patient needs) Electrolytes in TPN: Na 194m/L, K 5047mL, Ca 5mE79m, Mg 5mEq40m and Phos 15mmo26m Cl:Ac 1:2 Add standard trace elements and MVI to TPN Continue Sensitive q4h SSI and adjust as needed  Reduce D5LR to 50 mL/hr at 1800 or per MD Monitor TPN labs on Mon/Thurs, CMET/Mg/Phos on 12/15 Watch volume status and adjust concentrated TPN to standard TPN as able.  Mary-ADimple NanasmD, BCPS 11/04/2022 9:28 AM

## 2022-11-04 NOTE — Progress Notes (Signed)
Mobility Specialist Progress Note    11/04/22 1449  Mobility  Activity Refused mobility   Pt stated he is nauseous and in pain. Will f/u as schedule permits.   Hildred Alamin Mobility Specialist  Please Psychologist, sport and exercise or Rehab Office at [phone]

## 2022-11-04 NOTE — Progress Notes (Addendum)
ANTICOAGULATION CONSULT NOTE - follow up  Pharmacy Consult:  Argatroban Indication: DVT  Allergies  Allergen Reactions   Enoxaparin Other (See Comments)    Blood clots HIT [SRA +]   Heparin Other (See Comments)    Heparin induced thrombocytopenia (SRA +)    Patient Measurements: Height: 6' (182.9 cm) Weight: 96.8 kg (213 lb 6.5 oz) IBW/kg (Calculated) : 77.6  Vital Signs: Temp: 98.7 F (37.1 C) (12/14 0723) Temp Source: Oral (12/14 0723) BP: 129/94 (12/14 0723) Pulse Rate: 100 (12/14 0723)  Labs: Recent Labs    11/02/22 0215 11/02/22 1808 11/03/22 0316 11/03/22 1347 11/03/22 1752 11/04/22 0026  HGB 8.6* 8.8* 8.4*  --   --  8.8*  HCT 26.2* 27.1* 24.9*  --   --  26.6*  PLT 111*  --  102*  --   --  120*  APTT  --   --   --  60* 69* 66*  CREATININE 1.82*  --  1.69*  --   --  1.50*     Estimated Creatinine Clearance: 49.8 mL/min (A) (by C-G formula based on SCr of 1.5 mg/dL (H)).   Medical History: Past Medical History:  Diagnosis Date   AL amyloidosis ([place]) 11/04/2012   Arthritis    lumbar DDD   Blood dyscrasia    plasma cell dyscrasia with associated amyloidosis   CKD (chronic kidney disease)    DVT (deep venous thrombosis) (HCC)    right   Dyslipidemia    H/O multiple myeloma    Heparin induced thrombocytopenia (HIT) ([place]) 11/04/2012   Joint pain    Peripheral vascular disease ([place])    DVT- 01/2012, follows by Dr. [name], lovenox maintained since Spring 2013 , pt. aware that last dose is sch. for 07/22/2012   Pneumonia    hx of with last time in Nov 2012   Prostate cancer [facility])    amyloidosis, multiple myeloma    Sleep apnea    severe with AHI 44.4 events per hour - now on CPAP at 11cm H2O   Spastic quadriparesis [facility])      Assessment: 25 YOM with history of DVT in March 2023 on Eliquis PTA.  Patient admitted with a perforated bowel requiring reversal for surgery.  On 12/13  stable and Pharmacy consulted to dose argatroban given history of  HIT.   aPTT is 66 seconds,  remains therapeutic on  Argatroban infusion at 0.16mg/kg/min.   ABLA:  Hgb in 8s range, but stable and plts decreased to 102 yesterday , now improved to 120K. No active bleeding reported.  Dr. RTana Coastnoted plans to resume Eliquis closer to discharge when taking po's consistently.     Goal of Therapy:  aPTT 50-90 seconds Monitor platelets by anticoagulation protocol: Yes   Plan:  Continue argatroban at 0.5 mcg/kg/hr Daily aPTT, CBC   Thank you for allowing pharmacy to be part of this patients care team.  RNicole Cella RPh Clinical Pharmacist  Please check AMION for all [facility]ering [place] 11/04/2022

## 2022-11-04 NOTE — Progress Notes (Signed)
At bedside to place a PIV due to incompatible meds. DL PICC is on the left upper arm. Assessed right arm for PIV placement and no sites found due to cephalic vein is hard and warm to touch from just below [facility] to mid upper arm. Primary RN aware and will notify MD.

## 2022-11-04 NOTE — Care Management Important Message (Signed)
Important Message  Patient Details  Name: Brent Allison MRN: [id number] Date of Birth: 14-Apr-1946   Medicare Important Message Given:  Yes     Saide Lanuza [address] 11/04/2022, 11:16 AM

## 2022-11-04 NOTE — Progress Notes (Signed)
Nutrition Follow-up  DOCUMENTATION CODES:   Non-severe (moderate) malnutrition in context of chronic illness  INTERVENTION:   - Initiate TPN, management per Pharmacy  - Continue clear liquid diet as tolerated  - Boost Breeze po TID as tolerated, each supplement provides 250 kcal and 9 grams of protein  - d/c PROSource Plus po as pt has refused all doses  - d/c MVI with minerals po; Pharmacy adding standard MVI to TPN  NUTRITION DIAGNOSIS:   Moderate Malnutrition related to chronic illness (amyloidosis, CHF, CKD IIIa) as evidenced by mild fat depletion, moderate muscle depletion.  Ongoing, being addressed via initiation of TPN  GOAL:   Patient will meet greater than or equal to 90% of their needs  Progressing with initiation of TPN  MONITOR:   PO intake, Supplement acceptance, Diet advancement, Labs, I & O's, Weight trends, Other (TPN)  REASON FOR ASSESSMENT:   Consult New TPN/TNA  ASSESSMENT:   76 year old male who presented to the ED on 12/09 with abdominal pain. 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 Pt admitted with severe sepsis with abdominal perforation, feculent peritonitis, mesenteric ischemia, AKI.  12/09 - s/p ex lap, transverse colectomy, placement of abthera wound VAC (abdomen open), returned to ICU on vent  12/11 - s/p ex lap with abdominal closure  12/12 - extubation, clear liquid diet  Consult received for new TPN. Pt with post-op ileus. Pt remains on clear liquid diet and is nauseated. Discussed pt with TPN Pharmacist. Plan is to concentrate TPN given pt is net positive 9 L since admission.  Spoke with pt at bedside. Pt reports feeling very poorly today and endorses significant nausea. Pt unable to contribute much to conversation at this time due to not feeling well. Discussed plan for initiation of TPN to provide nutrition given pt has been NPO/clear liquids since admission  (x 5 days). Pt expresses understanding.  RD to d/c PROSource Plus as pt has refused all doses. Will continue Boost Breeze for pt to consume as tolerated.  Admit weight: 85.3 kg Current weight: 96.8 kg  Pt with mild pitting generalized edema.  Meal Completion: 50% x 2 meals on clear liquid diet  Medications reviewed and include: Boost Breeze TID, PROSource Plus TID, SSI q 4 hours, MVI with minerals, IV protonix, argatroban drip, IV abx IVF: D5 in LR @ 100 ml/hr  Vitamin/Mineral Profile: Vitamin B12: 1024 (high) Folate B9: 7.9 (WNL)  Labs reviewed: BUN 30, creatinine 1.50, hemoglobin 8.8, platelets 120 CBG's: 97-121 x 24 hours  UOP: 1850 ml x 24 hours I/O's: +9.0 L since admit  Diet Order:   Diet Order             Diet clear liquid Room service appropriate? Yes; Fluid consistency: Thin  Diet effective now                   EDUCATION NEEDS:   Education needs have been addressed  Skin:  Skin Assessment: Skin Integrity Issues: Incisions: midline, new ostomy  Last BM:  no documented BM; no documented output from colostomy  Height:   Ht Readings from Last 1 Encounters:  11/03/22 6' (1.829 m)    Weight:   Wt Readings from Last 1 Encounters:  11/04/22 96.8 kg    Ideal Body Weight:  80.9 kg  BMI:  Body mass index is 28.94 kg/m.  Estimated Nutritional Needs:   Kcal:  2200-2400  Protein:  120-140  grams  Fluid:  >2.0 L    Gustavus Bryant, MS, RD, LDN Inpatient Clinical Dietitian Please see AMiON for contact information.

## 2022-11-04 NOTE — Consult Note (Addendum)
[facility] Nurse ostomy follow up Stoma type/location:  RUQ colostomy Patient now with post op ileus.  Initiated TPN today.  Patient is nauseated and too sick to engage with teaching, ostomy care today. Pouch is intact.  Teaching will resume once patient is well enough, or family is present and desire to learn. States his daughter will most likely learn ostomy care as backup.  Will follow.  Estrellita Ludwig MSN, RN, FNP-BC CWON Wound, Ostomy, Continence Nurse [facility] [phone] Pager [phone]

## 2022-11-04 NOTE — Progress Notes (Signed)
[facility]                                                                              Cortlandt Capuano, is a 76 y.o. male, DOB - [birth year], ZDG:[id number] Admit date - 10/31/2022    Outpatient Primary MD for the patient is Jacalyn Lefevre, Jesse Sans, MD  LOS - 5  days  Chief Complaint  Patient presents with   Abdominal Pain    Patient B[facility] by [facility] from home with complaint of sudden onset of severe abdominal pain after eating dinner, reports stabbing pain.         Brief summary   Patient is a 76 year old male with history of diastolic CHF, recurrent DVT on Eliquis, CKD stage IIIa, amyloidosis, HLP, multiple myeloma, thrombocytopenia with HIT (diagnosed 10/2012), PVD, OSA presented to ED with acute onset of abdominal pain on the day of admission.  He was found to have intestinal perforation on CT scan.  Patient received Andexxa in ED for reversal of eliquis and surgery was consulted. In ED, BP 96/65, heart rate 106, temp 97.5 F, respiratory rate 33, leukocytosis WBCs 11.3, creatinine 1.75  Patient was transferred to the floor on 11/03/2022, TRH assumed care on 12/14  Significant Hospital Events:   11/20/2022: Presented with acute abdominal pain, CT with intestinal perforation  10/31/22  -surgery s/p ex lap, transverse colectomy and placement of abthera wound vac for bowel perforation, feculent peritonitis, mesenteric ischemia by Dr. Redmond Pulling on 10/31/22.  Intra-Op Findings: significant stool contamination, old periumbilical mesh, omentum and colonic tissue very friable and essentially fell apart, silk sutures marking proximal transverse & distal transverse colon.  On Eliquis at baseline. Andexxa pre-op.  11/16/2022 - surgery: s/p ex-lap, creation of colostomy and abdominal closure with retention sutures by Dr. Grandville Silos.  11/02/2022: extubated   Assessment & Plan    Principal Problem: Severe sepsis secondary to bowel perforation, feculent peritonitis, mesenteric ischemia- POA  S/p   ex-lap with transverse colectomy and placement of abthera wound vac (Dr. Redmond Pulling, 10/31/2022) S/p ex-lap with creation of colostomy and abdominal closure with retention sutures (Dr. Grandville Silos 11/07/2022) -Patient met severe sepsis criteria on admission secondary to mesenteric ischemia, bowel perforation, peritonitis.  Extubated and off vasopressors. -Continue IV Zosyn, stop date on 12/16 unless recommended to continue by surgery -Blood cultures NTD -Continue clear liquid diet -On argatroban infusion, will resume eliquis closer to discharge when patient is tolerating p.o.'s better -Management per surgery.  -Will start TPN for nutrition    Active Problems:   Acute respiratory failure with hypoxia secondary to above -Extubated on 11/02/2022, currently O2 sats 89 to 93% on 2 L -Continue I-S, mobilization, wean O2 as tolerated  History of recurrent VTE and HIT -Most recently in 01/2022, on indefinite anticoagulation with Eliquis.  Outpatient follow-up with Dr. Ammie Dalton -Started on argatroban on 12/13 -Will resume Eliquis closer to discharge when patient is tolerating p.o.'s consistently.  Mild AKI on CKD stage IIIa -Baseline clear 9 1.3-1.5, presented with creatinine of 1.75 on admission, plateaued at 1.8 -Creatinine 1.5, closer to baseline  Elevated troponins, type II NSTEMI, QTc prolongation -Likely due to demand ischemia from severe sepsis, mesenteric  ischemia, bowel perforation with peritonitis -2D echo completed on 12/10, results still pending -Avoid QT prolonging meds  Acute on chronic blood loss anemia, likely secondary to multiple surgeries -H&H currently stable, 8.8, transfuse for l<7.5  History of multiple myeloma, AL amyloidosis -MM diagnosed in 2012, ALM uteruses diagnosed in 2013, outpatient follow-up with Dr. Ammie Dalton  Generalized debility -PT OT evaluation once able to tolerate   Moderate protein calorie malnutrition Etiology: chronic illness (amyloidosis, CHF, CKD  IIIa) Signs/Symptoms: mild fat depletion, moderate muscle depletion Interventions: TPN Estimated body mass index is 28.94 kg/m as calculated from the following:   Height as of this encounter: 6' (1.829 m).   Weight as of this encounter: 96.8 kg.  Code Status: Full code DVT Prophylaxis:  SCDs Start: 11/07/2022 2300   Level of Care: Level of care: Progressive Family Communication: Updated patient   Disposition Plan:      Remains inpatient appropriate:    Procedures:   12/10 s/p Ex lap, transverse colectomy and placement of abthera wound vac for bowel perforation, feculent peritonitis, mesenteric ischemia by Dr. Redmond Pulling  12/11 ex lap, creation of colostomy and abdominal closure including retention sutures by Dr. Grandville Silos   Consultants:   Admitted by CCM General surgery   Antimicrobials:   Anti-infectives (From admission, onward)    Start     Dose/Rate Route Frequency Ordered Stop   11/02/22 1200  piperacillin-tazobactam (ZOSYN) IVPB 3.375 g        3.375 g 12.5 mL/hr over 240 Minutes Intravenous Every 8 hours 11/02/22 0942 11/06/22 0359   10/31/22 0400  piperacillin-tazobactam (ZOSYN) IVPB 3.375 g  Status:  Discontinued        3.375 g 12.5 mL/hr over 240 Minutes Intravenous Every 8 hours 11/07/2022 2330 11/02/22 0942   11/12/2022 2130  piperacillin-tazobactam (ZOSYN) IVPB 3.375 g        3.375 g 100 mL/hr over 30 Minutes Intravenous  Once 11/16/2022 2120 10/31/2022 2221          Medications  (feeding supplement) PROSource Plus  30 mL Oral TID BM   feeding supplement  1 Container Oral TID BM   insulin aspart  0-9 Units Subcutaneous Q4H   multivitamin with minerals  1 tablet Oral Daily   mupirocin ointment  1 Application Nasal BID   mouth rinse  15 mL Mouth Rinse 4 times per day   pantoprazole (PROTONIX) IV  40 mg Intravenous Q24H      Subjective:   Brent Allison was seen and examined today.  Feeling sick and nauseous, no fevers or active vomiting.  Denies any chest  pain or acute shortness of breath.  Objective:   Vitals:   11/04/22 0100 11/04/22 0300 11/04/22 0500 11/04/22 0723  BP: (!) 153/75 (!) 146/82  (!) 129/94  Pulse: 93 94  100  Resp: _0 Temp:  98.6 F (37 C)  98.7 F (37.1 C)  TempSrc:  Oral  Oral  SpO2: 96% 97%  93%  Weight:   96.8 kg   Height:        Intake/Output Summary (Last 24 hours) at 11/04/2022 1016 Last data filed at 11/04/2022 0600 Gross per 24 hour  Intake 2694.21 ml  Output 1500 ml  Net 1194.21 ml     Wt Readings from Last 3 Encounters:  11/04/22 96.8 kg  10/21/22 88.6 kg  10/05/22 86.7 kg     Exam General: Alert and oriented x 3, feeling sick and nauseous, ill-appearing Cardiovascular: S1 S2 auscultated,  RRR Respiratory: Clear to auscultation bilaterally, no wheezing Gastrointestinal: Somewhat distended, ostomy with not much output, dressing intact Ext: no pedal edema bilaterally Neuro: moving all 4 extremities spontaneously Psych: Normal affect     Data Reviewed:  I have personally reviewed following labs    CBC Lab Results  Component Value Date   WBC 10.3 11/04/2022   RBC 2.71 (L) 11/04/2022   HGB 8.8 (L) 11/04/2022   HCT 26.6 (L) 11/04/2022   MCV 98.2 11/04/2022   MCH 32.5 11/04/2022   PLT 120 (L) 11/04/2022   MCHC 33.1 11/04/2022   RDW 14.3 11/04/2022   LYMPHSABS 0.6 (L) 11/02/2022   MONOABS 0.3 11/02/2022   EOSABS 0.0 11/02/2022   BASOSABS 0.0 01/02/1734     Last metabolic panel Lab Results  Component Value Date   NA 143 11/04/2022   K 4.1 11/04/2022   CL 115 (H) 11/04/2022   CO2 22 11/04/2022   BUN 30 (H) 11/04/2022   CREATININE 1.50 (H) 11/04/2022   GLUCOSE 123 (H) 11/04/2022   GFRNONAA 48 (L) 11/04/2022   GFRAA >60 04/24/2019   CALCIUM 8.4 (L) 11/04/2022   PHOS 3.2 11/02/2022   PROT 4.1 (L) 11/02/2022   ALBUMIN 2.0 (L) 11/02/2022   LABGLOB 3.1 01/12/2022   AGRATIO 1.3 04/24/2019   BILITOT 0.7 11/02/2022   ALKPHOS 32 (L) 11/02/2022   AST 15 11/02/2022    ALT 11 11/02/2022   ANIONGAP 6 11/04/2022    CBG (last 3)  Recent Labs    11/03/22 2312 11/04/22 0328 11/04/22 0725  GLUCAP 104* 109* 121*      Coagulation Profile: Recent Labs  Lab 11/09/2022 2101  INR 1.6*     Radiology Studies: I have personally reviewed the imaging studies  [place] EKG SITE RITE  Result Date: 11/04/2022 If Site Rite image not attached, placement could not be confirmed due to current cardiac rhythm.      Estill Cotta M.D. [facility] 11/04/2022, 10:16 AM  Available via Epic secure chat 7am-7pm After 7 pm, please refer to night coverage provider listed on amion.

## 2022-11-04 NOTE — Progress Notes (Signed)
Pt refused to have cpap.

## 2022-11-04 NOTE — Progress Notes (Signed)
Peripherally Inserted Central Catheter Placement  The IV Nurse has discussed with the patient and/or persons authorized to consent for the patient, the purpose of this procedure and the potential benefits and risks involved with this procedure.  The benefits include less needle sticks, lab draws from the catheter, and the patient may be discharged home with the catheter. Risks include, but not limited to, infection, bleeding, blood clot (thrombus formation), and puncture of an artery; nerve damage and irregular heartbeat and possibility to perform a PICC exchange if needed/ordered by physician.  Alternatives to this procedure were also discussed.  Bard Power PICC patient education guide, fact sheet on infection prevention and patient information card has been provided to patient /or left at bedside.    PICC Placement Documentation  PICC Double Lumen 11/04/22 Left Brachial 44 cm 1 cm (Active)  Indication for Insertion or Continuance of Line Administration of hyperosmolar/irritating solutions (i.e. TPN, Vancomycin, etc.) 11/04/22 1420  Exposed Catheter (cm) 1 cm 11/04/22 1420  Site Assessment Clean, Dry, Intact 11/04/22 1420  Lumen #1 Status Flushed;Blood return noted;Saline locked 11/04/22 1420  Lumen #2 Status Flushed;Blood return noted;Saline locked 11/04/22 1420  Dressing Type Transparent 11/04/22 1420  Dressing Status Antimicrobial disc in place 11/04/22 [place] [employer] checked and tightened 11/04/22 1420  Dressing Change Due 11/11/22 11/04/22 1420       Nusaiba Guallpa Ramos 11/04/2022, 2:36 PM

## 2022-11-05 ENCOUNTER — Inpatient Hospital Stay: Payer: Self-pay

## 2022-11-05 DIAGNOSIS — K631 Perforation of intestine (nontraumatic): Secondary | ICD-10-CM | POA: Diagnosis not present

## 2022-11-05 DIAGNOSIS — R198 Other specified symptoms and signs involving the digestive system and abdomen: Secondary | ICD-10-CM | POA: Diagnosis not present

## 2022-11-05 DIAGNOSIS — E440 Moderate protein-calorie malnutrition: Secondary | ICD-10-CM | POA: Diagnosis not present

## 2022-11-05 DIAGNOSIS — K668 Other specified disorders of peritoneum: Secondary | ICD-10-CM | POA: Diagnosis not present

## 2022-11-05 LAB — CBC
HCT: 25.9 % — ABNORMAL LOW (ref 39.0–52.0)
Hemoglobin: 8.6 g/dL — ABNORMAL LOW (ref 13.0–17.0)
MCH: 32.3 pg (ref 26.0–34.0)
MCHC: 33.2 g/dL (ref 30.0–36.0)
MCV: 97.4 fL (ref 80.0–100.0)
Platelets: 133 10*3/uL — ABNORMAL LOW (ref 150–400)
RBC: 2.66 MIL/uL — ABNORMAL LOW (ref 4.22–5.81)
RDW: 14.1 % (ref 11.5–15.5)
WBC: 11.7 10*3/uL — ABNORMAL HIGH (ref 4.0–10.5)
nRBC: 0 % (ref 0.0–0.2)

## 2022-11-05 LAB — ECHOCARDIOGRAM COMPLETE
AR max vel: 3.43 cm2
AV Area VTI: 2.9 cm2
AV Area mean vel: 3.33 cm2
AV Mean grad: 2 mmHg
AV Peak grad: 4.8 mmHg
Ao pk vel: 1.1 m/s
Height: 72 in
S' Lateral: 2.6 cm
Weight: 3146.41 oz

## 2022-11-05 LAB — GLUCOSE, CAPILLARY
Glucose-Capillary: 108 mg/dL — ABNORMAL HIGH (ref 70–99)
Glucose-Capillary: 116 mg/dL — ABNORMAL HIGH (ref 70–99)
Glucose-Capillary: 110 mg/dL — ABNORMAL HIGH (ref 70–99)
Glucose-Capillary: 104 mg/dL — ABNORMAL HIGH (ref 70–99)
Glucose-Capillary: 141 mg/dL — ABNORMAL HIGH (ref 70–99)
Glucose-Capillary: 146 mg/dL — ABNORMAL HIGH (ref 70–99)

## 2022-11-05 LAB — PHOSPHORUS: Phosphorus: 2.6 mg/dL (ref 2.5–4.6)

## 2022-11-05 LAB — COMPREHENSIVE METABOLIC PANEL
ALT: 47 U/L — ABNORMAL HIGH (ref 0–44)
AST: 40 U/L (ref 15–41)
Albumin: 1.8 g/dL — ABNORMAL LOW (ref 3.5–5.0)
Alkaline Phosphatase: 80 U/L (ref 38–126)
Anion gap: 6 (ref 5–15)
BUN: 24 mg/dL — ABNORMAL HIGH (ref 8–23)
CO2: 24 mmol/L (ref 22–32)
Calcium: 8 mg/dL — ABNORMAL LOW (ref 8.9–10.3)
Chloride: 111 mmol/L (ref 98–111)
Creatinine, Ser: 1.37 mg/dL — ABNORMAL HIGH (ref 0.61–1.24)
GFR, Estimated: 53 mL/min — ABNORMAL LOW (ref >60)
Glucose, Bld: 125 mg/dL — ABNORMAL HIGH (ref 70–99)
Potassium: 3.5 mmol/L (ref 3.5–5.1)
Sodium: 141 mmol/L (ref 135–145)
Total Bilirubin: 0.9 mg/dL (ref 0.3–1.2)
Total Protein: 3.9 g/dL — ABNORMAL LOW (ref 6.5–8.1)

## 2022-11-05 LAB — APTT
aPTT: 65 seconds — ABNORMAL HIGH (ref 24–36)
aPTT: 63 seconds — ABNORMAL HIGH (ref 24–36)

## 2022-11-05 LAB — MAGNESIUM: Magnesium: 1.9 mg/dL (ref 1.7–2.4)

## 2022-11-05 LAB — TRIGLYCERIDES: Triglycerides: 111 mg/dL (ref <150)

## 2022-11-05 MED ORDER — TRACE MINERALS CU-MN-SE-ZN 300-55-60-3000 MCG/ML IV SOLN
INTRAVENOUS | Status: AC
  Filled 2022-11-05: qty 409.6

## 2022-11-05 NOTE — Progress Notes (Addendum)
[facility]                                                                              Brent Allison, is a 76 y.o. male, DOB - 07/04/1946, KLK:[id number] Admit date - 10/28/2022    Outpatient Primary MD for the patient is Jacalyn Lefevre, Jesse Sans, MD  LOS - 6  days  Chief Complaint  Patient presents with   Abdominal Pain    Patient [facility] by [facility] from home with complaint of sudden onset of severe abdominal pain after eating dinner, reports stabbing pain.         Brief summary   Patient is a 76 year old male with history of diastolic CHF, recurrent DVT on Eliquis, CKD stage IIIa, amyloidosis, HLP, multiple myeloma, thrombocytopenia with HIT (diagnosed 10/2012), PVD, OSA presented to ED with acute onset of abdominal pain on the day of admission.  He was found to have intestinal perforation on CT scan.  Patient received Andexxa in ED for reversal of eliquis and surgery was consulted. In ED, BP 96/65, heart rate 106, temp 97.5 F, respiratory rate 33, leukocytosis WBCs 11.3, creatinine 1.75  Patient was transferred to the floor on 11/03/2022, TR[facility] assumed care on 12/14  Significant Hospital Events:   11/21/2022: Presented with acute abdominal pain, CT with intestinal perforation  10/31/22  -surgery s/p ex lap, transverse colectomy and placement of abthera wound vac for bowel perforation, feculent peritonitis, mesenteric ischemia by Dr. Redmond Pulling on 10/31/22.  Intra-Op Findings: significant stool contamination, old periumbilical mesh, omentum and colonic tissue very friable and essentially fell apart, silk sutures marking proximal transverse & distal transverse colon.  On Eliquis at baseline. Andexxa pre-op.  10/29/2022 - surgery: s/p ex-lap, creation of colostomy and abdominal closure with retention sutures by Dr. Grandville Silos.  11/02/2022: extubated   Assessment & Plan    Principal Problem: Severe sepsis secondary to bowel perforation, feculent peritonitis, mesenteric ischemia- POA  S/p   ex-lap with transverse colectomy and placement of abthera wound vac (Dr. Redmond Pulling, 10/31/2022) S/p ex-lap with creation of colostomy and abdominal closure with retention sutures (Dr. Grandville Silos 11/02/2022) -Patient met severe sepsis criteria on admission secondary to mesenteric ischemia, bowel perforation, peritonitis.  Extubated and off vasopressors. -Continue IV Zosyn, stop date on 12/16 unless recommended to continue by surgery -Blood cultures NTD -Continue clear liquid diet, continue TPN until consistently taking p.o. -Defer management to general surgery   Active Problems:  Acute respiratory failure with hypoxia secondary to above -Extubated on 11/02/2022,  -O2 sat stable, 97 to 98% on 2 L -Continue I-S, mobilization, wean O2 as tolerated  Hemoptysis  -Since last night, 4-5 episodes, will hold argatroban -Pulmonology consulted to evaluate  History of recurrent VTE and HIT -Most recently in 01/2022, on indefinite anticoagulation with Eliquis.  Outpatient follow-up with Dr. Ammie Dalton -Started on argatroban on 12/13, plan was to resume Eliquis closer to discharge when patient is taking p.o.'s consistently -Will hold argatroban due to hemoptysis  Mild AKI on CKD stage IIIa -Baseline clear 9 1.3-1.5, presented with creatinine of 1.75 on admission, plateaued at 1.8 -Creatinine 1.3, closer to baseline  Elevated troponins, type II NSTEMI, QTc prolongation -  Likely due to demand ischemia from severe sepsis, mesenteric ischemia, bowel perforation with peritonitis -2D echo 12/10 showed EF of 50 to 55%, no regional WMA, indeterminate diastolic parameters -Avoid QT prolonging meds  Acute on chronic blood loss anemia, likely secondary to multiple surgeries -H&H stable, 8.6, slightly trended down from 8.8 yesterday  History of multiple myeloma, AL amyloidosis -MM diagnosed in 2012, AL amyloidosis diagnosed in 2013, outpatient follow-up with Dr. Ammie Dalton  Generalized debility -PT OT evaluation  once able to tolerate   Moderate protein calorie malnutrition Etiology: chronic illness (amyloidosis, CHF, CKD IIIa) Signs/Symptoms: mild fat depletion, moderate muscle depletion Interventions: Tube feeding Estimated body mass index is 28.26 kg/m as calculated from the following:   Height as of this encounter: 6' (1.829 m).   Weight as of this encounter: 94.5 kg.  Code Status: Full code DVT Prophylaxis:  SCDs Start: 11/17/2022 2300  Level of Care: Level of care: Progressive Family Communication: Updated patient's daughter, Casper Harrison  Disposition Plan:      Remains inpatient appropriate:    Procedures:  12/10 s/p Ex lap, transverse colectomy and placement of abthera wound vac for bowel perforation, feculent peritonitis, mesenteric ischemia by Dr. Redmond Pulling  12/11 ex lap, creation of colostomy and abdominal closure including retention sutures by Dr. Grandville Silos   Consultants:   Admitted by CCM General surgery   Antimicrobials:   Anti-infectives (From admission, onward)    Start     Dose/Rate Route Frequency Ordered Stop   11/02/22 1200  piperacillin-tazobactam (ZOSYN) IVPB 3.375 g        3.375 g 12.5 mL/hr over 240 Minutes Intravenous Every 8 hours 11/02/22 0942 11/06/22 0359   10/31/22 0400  piperacillin-tazobactam (ZOSYN) IVPB 3.375 g  Status:  Discontinued        3.375 g 12.5 mL/hr over 240 Minutes Intravenous Every 8 hours 10/31/2022 2330 11/02/22 0942   11/11/2022 2130  piperacillin-tazobactam (ZOSYN) IVPB 3.375 g        3.375 g 100 mL/hr over 30 Minutes Intravenous  Once 10/31/2022 2120 10/27/2022 2221          Medications  Chlorhexidine Gluconate Cloth  6 each Topical Daily   feeding supplement  1 Container Oral TID BM   insulin aspart  0-9 Units Subcutaneous Q4H   mupirocin ointment  1 Application Nasal BID   mouth rinse  15 mL Mouth Rinse 4 times per day   pantoprazole (PROTONIX) IV  40 mg Intravenous Q24H   sodium chloride flush  10-40 mL Intracatheter Q12H       Subjective:   Brent Allison was seen and examined today.  Feeling better this morning, no active nausea or vomiting or fevers.  Had 4-5 episodes of hemoptysis since last night.    Objective:   Vitals:   11/05/22 0425 11/05/22 0506 11/05/22 0800 11/05/22 1145  BP: 128/60  133/63 121/63  Pulse: 99  98 94  Resp: (!) 28  (!) 24   Temp: 98.1 F (36.7 C)  98.1 F (36.7 C) 98.5 F (36.9 C)  TempSrc: Oral  Oral Oral  SpO2: 98%  90% 98%  Weight:  94.5 kg    Height:        Intake/Output Summary (Last 24 hours) at 11/05/2022 1334 Last data filed at 11/05/2022 1212 Gross per 24 hour  Intake 120 ml  Output 1350 ml  Net -1230 ml     Wt Readings from Last 3 Encounters:  11/05/22 94.5 kg  10/21/22 88.6 kg  10/05/22 86.7  kg   Physical Exam General: Alert and oriented x 3, NAD Cardiovascular: S1 S2 clear, RRR.  Respiratory: CTAB, no wheezing, rales or rhonchi Gastrointestinal: Distention improving, soft, ostomy bag with liquid brown stools today.  Dressing intact  Ext: no pedal edema bilaterally Neuro: no new deficits Psych: Normal affect    Data Reviewed:  I have personally reviewed following labs    CBC Lab Results  Component Value Date   WBC 11.7 (H) 11/05/2022   RBC 2.66 (L) 11/05/2022   HGB 8.6 (L) 11/05/2022   HCT 25.9 (L) 11/05/2022   MCV 97.4 11/05/2022   MCH 32.3 11/05/2022   PLT 133 (L) 11/05/2022   MCHC 33.2 11/05/2022   RDW 14.1 11/05/2022   LYMPHSABS 0.6 (L) 11/02/2022   MONOABS 0.3 11/02/2022   EOSABS 0.0 11/02/2022   BASOSABS 0.0 86/57/8469     Last metabolic panel Lab Results  Component Value Date   NA 141 11/05/2022   K 3.5 11/05/2022   CL 111 11/05/2022   CO2 24 11/05/2022   BUN 24 (H) 11/05/2022   CREATININE 1.37 (H) 11/05/2022   GLUCOSE 125 (H) 11/05/2022   GFRNONAA 53 (L) 11/05/2022   GFRAA >60 04/24/2019   CALCIUM 8.0 (L) 11/05/2022   PHOS 2.6 11/05/2022   PROT 3.9 (L) 11/05/2022   ALBUMIN 1.8 (L) 11/05/2022   LABGLOB  3.1 01/12/2022   AGRATIO 1.3 04/24/2019   BILITOT 0.9 11/05/2022   ALKPHOS 80 11/05/2022   AST 40 11/05/2022   ALT 47 (H) 11/05/2022   ANIONGAP 6 11/05/2022    CBG (last 3)  Recent Labs    11/05/22 0424 11/05/22 0803 11/05/22 1147  GLUCAP 108* 116* 110*      Coagulation Profile: Recent Labs  Lab 11/20/2022 2101  INR 1.6*     Radiology Studies: I have personally reviewed the imaging studies  [place] EKG SITE RITE  Result Date: 11/05/2022 If Site Rite image not attached, placement could not be confirmed due to current cardiac rhythm.  [place] EKG SITE RITE  Result Date: 11/04/2022 If Site Rite image not attached, placement could not be confirmed due to current cardiac rhythm.      Estill Cotta M.D. [facility] 11/05/2022, 1:34 PM  Available via Epic secure chat 7am-7pm After 7 pm, please refer to night coverage provider listed on amion.

## 2022-11-05 NOTE — Progress Notes (Signed)
I went to investigate "grunting" noises that was coming from pt room. Upon entering he was trying to use the urinal and stating that he needed to get up. I found that he had removed his PICC line. I cleaned up the site and placed a pressure bandage- there was minimal bleeding. Notified MD and placed a Safety Zone.  Claudine Mouton, RN

## 2022-11-05 NOTE — Progress Notes (Signed)
Mobility Specialist Progress Note    11/05/22 1613  Mobility  Activity Transferred from bed to chair  Level of Assistance Moderate assist, patient does 50-74%  Assistive Device Front wheel walker  Distance Ambulated (ft) 2 ft  Activity Response Tolerated fair  Mobility Referral Yes  $Mobility charge 1 Mobility   Pre-Mobility: 98 HR, 90% SpO2 During Mobility: 117/64 (72) BP Post-Mobility: 95 HR, 126/61 (80) BP  Pt received in bed and agreeable. C/o dizziness once sitting EOB. ModA for bed mobility and minA to stand and pivot. Pt attempted to sit prematurely in chair. Once in chair, pt c/o just a little dizziness. Left with call bell in reach and chair alarm on. RN aware.   Hildred Alamin Mobility Specialist  Please Psychologist, sport and exercise or Rehab Office at [phone]

## 2022-11-05 NOTE — Progress Notes (Signed)
NAME:  Brent Allison, MRN:  [id number], DOB:  14-Jan-1946, LOS: 6 ADMISSION DATE:  11/09/2022, CONSULTATION DATE:  11/05/22 REFERRING MD:  Dr. Alvino Chapel ED, CHIEF COMPLAINT: Abdominal pain  History of Present Illness:  76 year old with known history of below presents with acute onset of abdominal pain about 7:30 PM, peritoneal exam, found to have viscus perforation on CT of the abdomen.  Acute onset of pain.  CT scan revealed intestinal perforation.  He takes Eliquis for VTE history.  Last dose this morning.  Received Andexxa in the ED for reversal.  Surgery was consulted.  Taken to the OR.  They recommended medical ICU admission versus surgical admission given complex medical history.  Lab notable for mild elevation in creatinine, AKI, mild leukocytosis with left shift, hemoglobin appears at baseline.  Blood cultures were obtained.  Patient was transferred out of ICU on 12/13  PCCM was reconsulted on 12/5 because of blood-tinged secretions  Pertinent  Medical History  History of amyloidosis, history of diastolic dysfunction although euvolemic on exam, history of DVT  Significant Hospital Events: Including procedures, antibiotic start and stop dates in addition to other pertinent events   11/07/2022 presents with acute onset abdominal pain found to have abdominal perforation 10/31/22  - Surgery : POD 0 s/p Ex lap, transverse colectomy and placement of abthera wound vac for bowel perforation, feculent peritonitis, mesenteric ischemia by Dr. Redmond Pulling on 10/31/22. - Intra-Op Findings: significant stool contamination, old periumbilical mesh, omentum and colonic tissue very friable and essentially fell apart, silk sutures marking proximal transverse & distal transverse colon.  On Eliquis at baseline. Andexxa pre-op. T  11/15/2022 - surgery: POD 0 s/p ex-lap, creation of colostomy and abdominal closure with retention sutures by Dr. Grandville Silos; holding [facility]. Tolerating PS 5 over 5, extubate.  Interim  History / Subjective:  Patient remained afebrile Stated continue to have little bit of abdominal pain Had blood-tinged secretions  Objective   Blood pressure 121/63, pulse 94, temperature 98.5 F (36.9 C), temperature source Oral, resp. rate (!) 24, height 6' (1.829 m), weight 94.5 kg, SpO2 98 %.        Intake/Output Summary (Last 24 hours) at 11/05/2022 1456 Last data filed at 11/05/2022 1212 Gross per 24 hour  Intake 120 ml  Output 1350 ml  Net -1230 ml   Filed Weights   11/03/22 2254 11/04/22 0500 11/05/22 0506  Weight: 96.9 kg 96.8 kg 94.5 kg    Physical exam: General: Elderly male, lying on the bed HEENT: [place]/AT, eyes anicteric.  moist mucus membranes Neuro: Alert, awake following commands Chest: Coarse breath sounds, no wheezes or rhonchi Heart: Regular rate and rhythm, no murmurs or gallops Abdomen: Soft, midline incision looks clean and dry, colostomy in place with liquid brown stool, nondistended, sluggish bowel sounds present Skin: No rash    Resolved Hospital Problem list   Lactic acidosis - present on admission - resolved 12/12  Assessment & Plan:  Acute peritonitis Mesenteric ischemia with bowel perforation status post ex lap with partial colectomy and ostomy Acute hypoxic respiratory failure Recurrent DVTs AKI on CKD stage IIIa due to septic ATN Acute on chronic blood loss anemia, perioperative Prediabetes Demand cardiac ischemia Thrombocytopenia in setting of sepsis Hemoptysis  I think the patient had blood-tinged secretions likely due to frequent suctioning His hemoglobin remained stable for last few days Platelet counts are improving I agree with stopping argatroban for 24 hours if patient continues to have hemoptysis, unfortunately then he cannot be anticoagulated and would  consider IVC filter placement  Rest of the management per primary team    Jacky Kindle, MD [facility] See Amion for pager If no response to pager,  please call [phone] until 7pm After 7pm, Please call E-link [phone]

## 2022-11-05 NOTE — Plan of Care (Signed)

## 2022-11-05 NOTE — Progress Notes (Signed)
ANTICOAGULATION CONSULT NOTE - follow up  Pharmacy Consult:  Argatroban Indication: DVT  Allergies  Allergen Reactions   Enoxaparin Other (See Comments)    Blood clots HIT [SRA +]   Heparin Other (See Comments)    Heparin induced thrombocytopenia (SRA +)    Patient Measurements: Height: 6' (182.9 cm) Weight: 94.5 kg (208 lb 5.4 oz) IBW/kg (Calculated) : 77.6  Vital Signs: Temp: 98.5 F (36.9 C) (12/15 1145) Temp Source: Oral (12/15 1145) BP: 121/63 (12/15 1145) Pulse Rate: 94 (12/15 1145)  Labs: Recent Labs    11/03/22 0316 11/03/22 1347 11/04/22 0026 11/05/22 0027 11/05/22 1215  HGB 8.4*  --  8.8* 8.6*  --   HCT 24.9*  --  26.6* 25.9*  --   PLT 102*  --  120* 133*  --   APTT  --    < > 66* 65* 63*  CREATININE 1.69*  --  1.50* 1.37*  --    < > = values in this interval not displayed.     Estimated Creatinine Clearance: 53.9 mL/min (A) (by C-G formula based on SCr of 1.37 mg/dL (H)).   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 [name], lovenox maintained since Spring 2013 , pt. aware that last dose is sch. for 07/22/2012   Pneumonia    hx of with last time in Nov 2012   Prostate cancer [facility])    amyloidosis, multiple myeloma    Sleep apnea    severe with AHI 44.4 events per hour - now on CPAP at 11cm H2O   Spastic quadriparesis [facility])      Assessment: 74 YOM with history of recurerrent DVT in March 2023 on Eliquis PTA.  Patient admitted with a perforated bowel requiring reversal for surgery.  On 12/13  stable and Pharmacy consulted to dose argatroban  given history of HIT.  Dr. Tana Coast noted plans to resume Eliquis closer to discharge when taking po's consistently.  Today 12/15:  ABLA:  Hgb stable  in 8s range, plts  improved to 133K today.   At 12noon the aPTT is 63 seconds,  therapeutic on Argatroban infusion at 0.30mg/kg/min.  He had 4-5 episodes of hemoptysis since last night.  Argatroban  now on hold .  PCCM evalutated and recommends to hold argatroban for 24 hours.      Goal of Therapy:  aPTT 50-90 seconds Monitor platelets by anticoagulation protocol: Yes   Plan:   Argatroban infusion is on hold.  CCM recommends to hold argatroban for 24 hrs.  Will follow up tomorrow for anticoagulation plan   Thank you for allowing pharmacy to be part of this patients care team.  RNicole Cella RPh Clinical Pharmacist  Please check AMION for all MCosbynumbers 11/05/2022

## 2022-11-05 NOTE — Progress Notes (Addendum)
Pre-PICC Assessment Left arm has significant bruising noted at old insertion site with edema noted. No drainage noted. No other suitable vessels identified for PICC insertion. Right arm has diffuse bruising noted with moderate amount of edema. Pinkish streak noted at old puncture site (appearing as an old PIV) at the antecubital fossa. Cordis   palpated above site. Identified brachial vessel. Easily compressible. 17% occupancy. Cannulated vessel without issue.  Peripherally Inserted Central Catheter Placement The IV Nurse has discussed with the patient and/or persons authorized to consent for the patient, the purpose of this procedure and the potential benefits and risks involved with this procedure.  The benefits include less needle sticks, lab draws from the catheter, and the patient may be discharged home with the catheter. Risks include, but not limited to, infection, bleeding, blood clot (thrombus formation), and puncture of an artery; nerve damage and irregular heartbeat and possibility to perform a PICC exchange if needed/ordered by physician.  Alternatives to this procedure were also discussed.  Bard Power PICC patient education guide, fact sheet on infection prevention and patient information card has been provided to patient /or left at bedside.    PICC Placement Documentation  PICC Triple Lumen 57/32/20 Right Basilic 43 cm 0 cm (Active)  Indication for Insertion or Continuance of Line Administration of hyperosmolar/irritating solutions (i.e. TPN, Vancomycin, etc.) 11/05/22 1019  Exposed Catheter (cm) 0 cm 11/05/22 1019  Site Assessment Clean, Dry, Intact 11/05/22 1019  Lumen #1 Status Flushed;Saline locked;Blood return noted 11/05/22 1019  Lumen #2 Status Flushed;Saline locked;Blood return noted 11/05/22 1019  Lumen #3 Status Flushed;Saline locked;Blood return noted 11/05/22 1019  Dressing Type Transparent;Securing device 11/05/22 1019  Dressing Status Antimicrobial disc in place;Clean,  Dry, Intact 11/05/22 1019  Safety Lock Not Applicable 25/42/70 6237  Line Care Connections checked and tightened 11/05/22 1019  Line Adjustment (NICU/IV Team Only) No 11/05/22 1019  Dressing Intervention New dressing 11/05/22 1019  Dressing Change Due 11/12/22 11/05/22 [place] 11/05/2022, 10:22 AM

## 2022-11-05 NOTE — Progress Notes (Signed)
4 Days Post-Op  Subjective: CC: Patient reports nausea has improved. No prn nausea medications since yesterday am but still having intermittent nausea, burping, belching and feels bloated. Taking in small amount of jello for dinner and breakfast. No other po intake. Denies vomiting. Has a small amount of stool in ostomy bag this am. Voiding. Has not been oob. Feels pain is well controlled with current medications.   Afebrile overnight. HR 90-low 100's. No hypotension. Requiring [place]. Denies sob.  Cr improving 1.5 > 1.37 Hgb stable at 8.6 from 8.8  Objective: Vital signs in last 24 hours: Temp:  [98.1 F (36.7 C)-98.9 F (37.2 C)] 98.1 F (36.7 C) (12/15 0800) Pulse Rate:  [95-103] 98 (12/15 0800) Resp:  [20-32] 28 (12/15 0425) BP: (122-139)/(60-98) 128/60 (12/15 0425) SpO2:  [86 %-98 %] 90 % (12/15 0800) Weight:  [94.5 kg] 94.5 kg (12/15 0506) Last BM Date :  (PTA)  Intake/Output from previous day: 12/14 0701 - 12/15 0700 In: -  Out: 1600 [Urine:1600] Intake/Output this shift: No intake/output data recorded.  PE: Gen:  Alert, NAD, pleasant Card:  HR 90's while I was in the room  Pulm:  CTAB, no W/R/R, effort normal Abd: Mild distension that is improved from when I saw him earlier in the week. His abdomen is very soft. Appropriately ttp. No rigidity or guarding. Ostomy bag with liquid brown stool in bag. Stoma is only able to be partially visualized through ostomy pouch window 2/2 stool and is budded and viable appearing. Midline wound clean with retention sutures in place  Lab Results:  Recent Labs    11/04/22 0026 11/05/22 0027  WBC 10.3 11.7*  HGB 8.8* 8.6*  HCT 26.6* 25.9*  PLT 120* 133*   BMET Recent Labs    11/04/22 0026 11/05/22 0027  NA 143 141  K 4.1 3.5  CL 115* 111  CO2 22 24  GLUCOSE 123* 125*  BUN 30* 24*  CREATININE 1.50* 1.37*  CALCIUM 8.4* 8.0*   PT/INR No results for input(s): "LABPROT", "INR" in the last 72 hours. CMP     Component  Value Date/Time   NA 141 11/05/2022 0027   NA 140 01/29/2016 0831   K 3.5 11/05/2022 0027   K 4.4 01/29/2016 0831   CL 111 11/05/2022 0027   CL 106 03/14/2013 1304   CO2 24 11/05/2022 0027   CO2 24 01/29/2016 0831   GLUCOSE 125 (H) 11/05/2022 0027   GLUCOSE 105 01/29/2016 0831   GLUCOSE 94 03/14/2013 1304   BUN 24 (H) 11/05/2022 0027   BUN 21.5 01/29/2016 0831   CREATININE 1.37 (H) 11/05/2022 0027   CREATININE 1.54 (H) 10/21/2022 1433   CREATININE 1.3 01/29/2016 0831   CALCIUM 8.0 (L) 11/05/2022 0027   CALCIUM 9.1 01/29/2016 0831   PROT 3.9 (L) 11/05/2022 0027   PROT 6.1 01/29/2016 0831   PROT 6.5 01/29/2016 0831   ALBUMIN 1.8 (L) 11/05/2022 0027   ALBUMIN 3.5 01/29/2016 0831   AST 40 11/05/2022 0027   AST 13 (L) 10/21/2022 1433   AST 16 01/29/2016 0831   ALT 47 (H) 11/05/2022 0027   ALT 9 10/21/2022 1433   ALT 10 01/29/2016 0831   ALKPHOS 80 11/05/2022 0027   ALKPHOS 92 01/29/2016 0831   BILITOT 0.9 11/05/2022 0027   BILITOT 0.4 10/21/2022 1433   BILITOT 0.41 01/29/2016 0831   GFRNONAA 53 (L) 11/05/2022 0027   GFRNONAA 46 (L) 10/21/2022 1433   GFRAA >60 04/24/2019 5638  Lipase     Component Value Date/Time   LIPASE 10 (L) 10/28/2012 2253    Studies/Results: [place] EKG SITE RITE  Result Date: 11/05/2022 If Site Rite image not attached, placement could not be confirmed due to current cardiac rhythm.  [place] EKG SITE RITE  Result Date: 11/04/2022 If Site Rite image not attached, placement could not be confirmed due to current cardiac rhythm.   Anti-infectives: Anti-infectives (From admission, onward)    Start     Dose/Rate Route Frequency Ordered Stop   11/02/22 1200  piperacillin-tazobactam (ZOSYN) IVPB 3.375 g        3.375 g 12.5 mL/hr over 240 Minutes Intravenous Every 8 hours 11/02/22 0942 11/06/22 0359   10/31/22 0400  piperacillin-tazobactam (ZOSYN) IVPB 3.375 g  Status:  Discontinued        3.375 g 12.5 mL/hr over 240 Minutes Intravenous Every 8 hours  11/15/2022 2330 11/02/22 0942   11/07/2022 2130  piperacillin-tazobactam (ZOSYN) IVPB 3.375 g        3.375 g 100 mL/hr over 30 Minutes Intravenous  Once 11/04/2022 2120 11/18/2022 2221        Assessment/Plan POD 5 s/p Ex lap, transverse colectomy and placement of abthera wound vac for bowel perforation, feculent peritonitis, mesenteric ischemia by Dr. Redmond Pulling on 10/31/22 POD 4 s/p ex lap, creation of colostomy and abdominal closure including retention sutures on 12/11 by Dr. Grandville Silos - Intra-Op Findings from 12/10: significant stool contamination, old periumbilical mesh, omentum and colonic tissue very friable and essentially fell apart, silk sutures marking proximal transverse & distal transverse colon  - Ileus  Sounds like it is improving as patient has some ROBF but still with bloating/distension and intermittent nausea, burping/belching w/ minimal po intake on cld. Will leave on cld today. Hopefully will continue to open up and can be advanced tomorrow. Cont TPN - WTD BID between retention sutures - Cont abx through 12/16 - Mobilize, PT consult - Pulm toilet - On argatroban infusion, resume DOAC when taking PO better   FEN - clears, boost breeze, TPN VTE - SCDs, On Eliquis at baseline. Argatroban gtt (hx HIT) ID - Zosyn (stop date 12/16). Afebrile. WBC 11.7 Foley - out and voiding.    - Per CCM -  AKI on CKD3A - Cr improving  Amyloidosis  History of recurrent VTE: Most recently DVT in 01/2022 - On Eliquis at baseline. Argatroban gtt (hx HIT) NSTEMI ABL anemia - hgb stable    LOS: 6 days    [name] , [facility] 11/05/2022, 8:24 AM Please see Amion for pager number during day hours 7:00am-4:30pm

## 2022-11-05 NOTE — Progress Notes (Signed)
PHARMACY - TOTAL PARENTERAL NUTRITION CONSULT NOTE   Indication: Prolonged ileus  Patient Measurements: Height: 6' (182.9 cm) Weight: 94.5 kg (208 lb 5.4 oz) IBW/kg (Calculated) : 77.6 TPN AdjBW (KG): 96.9 Body mass index is 28.26 kg/m.  Assessment: 76 yo male admitted on 12/9 for abdominal pain found to have peritonitis due to transverse colon perforation possibly secondary to mesenteric ischemia. S/p ex lap, creating of colostomy and abdominal closure on 12/11. Patient was extubated on 12/12 and started on a clear liquid diet, however has had minimal PO intake since admission and unable to tolerate nutritional supplements due to severe nausea. Pharmacy has been consulted to dose TPN.   +9L thus far this admit and +1 edema noted- d/w RD and will concentrate TPN.   Overnight 12/14, patient removed PICC line and TPN was stopped. Patient had D5LR running at 50 mL/hr. No hypoglycemic episodes per d/w RN. Planning to replace PICC line today.   Glucose / Insulin: hx pre-DM, A1c 5.7%. On D5LR @ 50 mL/hr. CBGs <130. Used 1 unit of sSSI/24 hours. No DM meds PTA. Electrolytes: Na 141 (on D5NS), K 4.1 >> 3.5 as renal fxn cont to improve, Cl 111, CO2 24, CoCa 9.8, Mg 1.9, phos 2.6 Renal: Scr improving today to 1.37, BUN improved to 24 (BL Scr appears ~1.5-1.7) Hepatic: AST wnl, ALT sl elevated at 47, Alb low at 1.8, alk phos wnl 80, Tbili wnl at 0.9, TG 111 Intake / Output: +7L thus far; UOP 0.7 mL/kg/hr. No PO intake recorded today but noted patient consumed small amount of jello for dinner and breakfast. Small amount of ostomy output (not yet quantified) MIVF: D5NS @ 50 mL/hr GI Imaging: - 12/9 CTAP: suspected pneumatosis, concern for mesenteric ischemia GI Surgeries / Procedures:  - 12/10 s/p Ex lap, transverse colectomy and placement of abthera wound vac for bowel perforation, feculent peritonitis, mesenteric ischemia - 12/11 s/p ex lap, creation of colostomy and abdominal closure including  retention sutures  Central access: ordered to be placed 12/14 TPN start date: 12/14  Nutritional Goals: Goal TPN rate is 80 mL/hr (provides 123 g of protein and 2293 kcals per day meeting 100% of needs)  RD Assessment: Estimated Needs Total Energy Estimated Needs: 2200-2400 Total Protein Estimated Needs: 120-140 grams Total Fluid Estimated Needs: >2.0 L  Current Nutrition:  Clear liquids and TPN Boost Breeze TID as tolerated (intermittently refusing d/t nausea)  Plan:  Resume concentrated TPN at 36m/hr at 1800 (half goal TPN - will provide 61g AA and 1146 kCal meeting 50% of patient needs) Electrolytes in TPN: Na 158m/L, K 5524mL, Ca 5mE80m, Mg 5mEq81m and Phos 17mmo62m Cl:Ac 1:2 Add standard trace elements and MVI to TPN Continue Sensitive q4h SSI and adjust as needed  Continue D5NS to 50 mL/hr at 1800 or per MD Monitor TPN labs on Mon/Thurs, BMET/Mg/Phos on 12/16 Watch volume status and adjust concentrated TPN to standard TPN as able.  Mary-ADimple NanasmD, BCPS 11/05/2022 7:15 AM

## 2022-11-05 NOTE — Progress Notes (Signed)
ANTICOAGULATION CONSULT NOTE - follow up  Pharmacy Consult:  Argatroban  (currently on hold due to hemoptysis) Indication: DVT  Allergies  Allergen Reactions   Enoxaparin Other (See Comments)    Blood clots HIT [SRA +]   Heparin Other (See Comments)    Heparin induced thrombocytopenia (SRA +)    Patient Measurements: Height: 6' (182.9 cm) Weight: 94.5 kg (208 lb 5.4 oz) IBW/kg (Calculated) : 77.6  Vital Signs: Temp: 98.5 F (36.9 C) (12/15 1145) Temp Source: Oral (12/15 1145) BP: 121/63 (12/15 1145) Pulse Rate: 94 (12/15 1145)  Labs: Recent Labs    11/03/22 0316 11/03/22 1347 11/04/22 0026 11/05/22 0027 11/05/22 1215  HGB 8.4*  --  8.8* 8.6*  --   HCT 24.9*  --  26.6* 25.9*  --   PLT 102*  --  120* 133*  --   APTT  --    < > 66* 65* 63*  CREATININE 1.69*  --  1.50* 1.37*  --    < > = values in this interval not displayed.     Estimated Creatinine Clearance: 53.9 mL/min (A) (by C-G formula based on SCr of 1.37 mg/dL (H)).   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 [name], lovenox maintained since Spring 2013 , pt. aware that last dose is sch. for 07/22/2012   Pneumonia    hx of with last time in Nov 2012   Prostate cancer [facility])    amyloidosis, multiple myeloma    Sleep apnea    severe with AHI 44.4 events per hour - now on CPAP at 11cm H2O   Spastic quadriparesis [facility])      Assessment: 5 YOM with history of recurerrent DVT in March 2023 on Eliquis PTA.  Patient admitted with a perforated bowel requiring reversal for surgery.  On 12/13  stable and  Pharmacy consulted to dose argatroban given history of HIT.  Dr. Tana Coast noted plans to resume Eliquis closer to discharge when taking po's consistently.  Today 12/15:  ABLA:  Hgb stable  in 8s range, plts  improved to 133K today.   At 12noon the aPTT is 63 seconds,  therapeutic on Argatroban infusion at 0.96mg/kg/min.  He had 4-5 episodes of hemoptysis since last night.  Argatroban  now on hold .  PCCM evalutated and recommends to hold argatroban for 24 hours.      Goal of Therapy:  aPTT 50-90 seconds Monitor platelets by anticoagulation protocol: Yes   Plan:   Argatroban infusion is on hold.  CCM recommends to hold argatroban for 24 hrs.  Will follow up tomorrow for anticoagulation plan   Thank you for allowing pharmacy to be part of this patients care team.  RNicole Cella RPh Clinical Pharmacist  Please check AMION for all MVantagenumbers 11/05/2022

## 2022-11-06 ENCOUNTER — Inpatient Hospital Stay (HOSPITAL_COMMUNITY): Payer: Medicare Other

## 2022-11-06 DIAGNOSIS — E440 Moderate protein-calorie malnutrition: Secondary | ICD-10-CM | POA: Diagnosis not present

## 2022-11-06 DIAGNOSIS — K631 Perforation of intestine (nontraumatic): Secondary | ICD-10-CM | POA: Diagnosis not present

## 2022-11-06 DIAGNOSIS — R198 Other specified symptoms and signs involving the digestive system and abdomen: Secondary | ICD-10-CM | POA: Diagnosis not present

## 2022-11-06 DIAGNOSIS — K668 Other specified disorders of peritoneum: Secondary | ICD-10-CM | POA: Diagnosis not present

## 2022-11-06 LAB — GLUCOSE, CAPILLARY
Glucose-Capillary: 150 mg/dL — ABNORMAL HIGH (ref 70–99)
Glucose-Capillary: 141 mg/dL — ABNORMAL HIGH (ref 70–99)
Glucose-Capillary: 143 mg/dL — ABNORMAL HIGH (ref 70–99)
Glucose-Capillary: 119 mg/dL — ABNORMAL HIGH (ref 70–99)
Glucose-Capillary: 123 mg/dL — ABNORMAL HIGH (ref 70–99)

## 2022-11-06 LAB — CBC
HCT: 23.2 % — ABNORMAL LOW (ref 39.0–52.0)
Hemoglobin: 7.3 g/dL — ABNORMAL LOW (ref 13.0–17.0)
MCH: 32.7 pg (ref 26.0–34.0)
MCHC: 31.5 g/dL (ref 30.0–36.0)
MCV: 104 fL — ABNORMAL HIGH (ref 80.0–100.0)
Platelets: 123 10*3/uL — ABNORMAL LOW (ref 150–400)
RBC: 2.23 MIL/uL — ABNORMAL LOW (ref 4.22–5.81)
RDW: 14.4 % (ref 11.5–15.5)
WBC: 9.3 10*3/uL (ref 4.0–10.5)
nRBC: 0 % (ref 0.0–0.2)

## 2022-11-06 LAB — BASIC METABOLIC PANEL
Anion gap: 7 (ref 5–15)
BUN: 23 mg/dL (ref 8–23)
CO2: 24 mmol/L (ref 22–32)
Calcium: 7.9 mg/dL — ABNORMAL LOW (ref 8.9–10.3)
Chloride: 112 mmol/L — ABNORMAL HIGH (ref 98–111)
Creatinine, Ser: 1.25 mg/dL — ABNORMAL HIGH (ref 0.61–1.24)
GFR, Estimated: 59 mL/min — ABNORMAL LOW (ref >60)
Glucose, Bld: 134 mg/dL — ABNORMAL HIGH (ref 70–99)
Potassium: 3.7 mmol/L (ref 3.5–5.1)
Sodium: 143 mmol/L (ref 135–145)

## 2022-11-06 LAB — PREPARE RBC (CROSSMATCH)

## 2022-11-06 LAB — PHOSPHORUS: Phosphorus: 2.6 mg/dL (ref 2.5–4.6)

## 2022-11-06 LAB — MAGNESIUM: Magnesium: 2.2 mg/dL (ref 1.7–2.4)

## 2022-11-06 MED ORDER — TRANEXAMIC ACID FOR INHALATION
500.0000 mg | Freq: Three times a day (TID) | RESPIRATORY_TRACT | Status: DC
  Administered 2022-11-06 – 2022-11-07 (×5): 500 mg via RESPIRATORY_TRACT
  Filled 2022-11-06 (×7): qty 10

## 2022-11-06 MED ORDER — SODIUM CHLORIDE 0.9% IV SOLUTION: Freq: Once | INTRAVENOUS | Status: AC

## 2022-11-06 MED ORDER — TRACE MINERALS CU-MN-SE-ZN 300-55-60-3000 MCG/ML IV SOLN
INTRAVENOUS | Status: AC
  Filled 2022-11-06: qty 409.6

## 2022-11-06 NOTE — Evaluation (Signed)
Physical Therapy Evaluation Patient Details Name: Brent Allison MRN: [id number] DOB: 09-24-1946 Today's Date: 11/06/2022  History of Present Illness  Pt is a 76 y.o. M who presents with acute onset of abdominal pain 10/24/2022. Found to have intestinal perforation on CT scan. 12/10 surgery s/p ex lap, transverse colectomy and placement of abthera wound vac for bowel perforation, feculent peritonitis, mesenteric ischemia. 12/11 surgery: s/p ex-lap, creation of colostomy and abdominal closure with retention sutures. Extubated 11/02/2022. Significant PMH: diastolic CHF, recurrent DVT on Eliquis, CKD stage IIIa, amyloidosis, HLP, multiple myeloma, thrombocytopenia with HIT (diagnosed 10/2012), PVD, OSA.  Clinical Impression  Pt admitted with above. PTA, pt lives alone and is independent. Pt reports one of his daughters is available to assist upon discharge. Pt presents with weakness, impaired standing balance, and decreased endurance. Pt requiring min assist overall for functional mobility. Ambulating 10 ft with a walker. Would benefit from AIR to address deficits and maximize functional mobility. Suspect pt can progress to modI level with intensive rehabilitation based on PLOF.     Recommendations for follow up therapy are one component of a multi-disciplinary discharge planning process, led by the attending physician.  Recommendations may be updated based on patient status, additional functional criteria and insurance authorization.  Follow Up Recommendations Acute inpatient rehab (3hours/day)      Assistance Recommended at Discharge PRN  Patient can return home with the following  A little help with walking and/or transfers;A little help with bathing/dressing/bathroom;Assistance with cooking/housework;Assist for transportation;Help with stairs or ramp for entrance    Equipment Recommendations None recommended by PT  Recommendations for Other Services  OT consult;Rehab consult     Functional Status Assessment Patient has had a recent decline in their functional status and demonstrates the ability to make significant improvements in function in a reasonable and predictable amount of time.     Precautions / Restrictions Precautions Precautions: Fall;Other (comment) Precaution Comments: abdominal incision, colostomy Restrictions Weight Bearing Restrictions: No      Mobility  Bed Mobility Overal bed mobility: Needs Assistance Bed Mobility: Rolling, Sidelying to Sit Rolling: Min guard Sidelying to sit: Min assist       General bed mobility comments: Cues for reaching for bed rail, utilized bed pad to scoot hips to edge of bed    Transfers Overall transfer level: Needs assistance Equipment used: Rolling walker (2 wheels) Transfers: Sit to/from Stand Sit to Stand: Min assist           General transfer comment: Elevated bed height, decreased eccentric control    Ambulation/Gait Ambulation/Gait assistance: Min assist Gait Distance (Feet): 10 Feet Assistive device: Rolling walker (2 wheels) Gait Pattern/deviations: Step-through pattern, Decreased stride length, Trunk flexed Gait velocity: decreased     General Gait Details: MinA for balance, fatiguing easily  Stairs            Wheelchair Mobility    Modified Rankin (Stroke Patients Only)       Balance Overall balance assessment: Needs assistance Sitting-balance support: Feet supported Sitting balance-Leahy Scale: Fair     Standing balance support: Bilateral upper extremity supported Standing balance-Leahy Scale: Poor Standing balance comment: reliant on RW                             Pertinent Vitals/Pain Pain Assessment Pain Assessment: Faces Faces Pain Scale: Hurts a little bit Pain Location: abdomen Pain Descriptors / Indicators: Operative site guarding Pain Intervention(s): Monitored during session  Home Living Family/patient expects to be discharged  to:: Private residence Living Arrangements: Spouse/significant other Available Help at Discharge: Family (pt daughter lives in [place] close by) Type of Home: House Home Access: Stairs to enter   [employer] of Steps: 3 in the front, 5 in the back. Back has rail, front does not Alternate Level Stairs-Number of Steps: flight with landing half way Home Layout: Two level;Bed/bath upstairs Home Equipment: Rolling Walker (2 wheels);Shower seat Additional Comments: pt wife and daughter have dementia and have moved into ALF    Prior Function Prior Level of Function : Independent/Modified Independent;Driving                     Hand Dominance   Dominant Hand: Right    Extremity/Trunk Assessment   Upper Extremity Assessment Upper Extremity Assessment: Overall WFL for tasks assessed    Lower Extremity Assessment Lower Extremity Assessment: Overall WFL for tasks assessed    Cervical / Trunk Assessment Cervical / Trunk Assessment: Normal  Communication   Communication: No difficulties  Cognition Arousal/Alertness: Awake/alert Behavior During Therapy: Flat affect Overall Cognitive Status: Within Functional Limits for tasks assessed                                 General Comments: WFL for basic mobility, could benefit from higher level cognitive assessment        General Comments General comments (skin integrity, edema, etc.): VSS on 2L O2    Exercises     Assessment/Plan    PT Assessment Patient needs continued PT services  PT Problem List Decreased strength;Decreased activity tolerance;Decreased balance;Decreased mobility;Pain       PT Treatment Interventions Gait training;DME instruction;Stair training;Functional mobility training;Therapeutic activities;Therapeutic exercise;Balance training;Patient/family education    PT Goals (Current goals can be found in the Care Plan section)  Acute Rehab PT Goals Patient Stated Goal: did  not state PT Goal Formulation: With patient Time For Goal Achievement: 11/20/22 Potential to Achieve Goals: Good    Frequency Min 3X/week     Co-evaluation               AM-PAC PT "6 Clicks" Mobility  Outcome Measure Help needed turning from your back to your side while in a flat bed without using bedrails?: A Little Help needed moving from lying on your back to sitting on the side of a flat bed without using bedrails?: A Little Help needed moving to and from a bed to a chair (including a wheelchair)?: A Little Help needed standing up from a chair using your arms (e.g., wheelchair or bedside chair)?: A Little Help needed to walk in hospital room?: A Little Help needed climbing 3-5 steps with a railing? : A Lot 6 Click Score: 17    End of Session Equipment Utilized During Treatment: Gait belt;Oxygen Activity Tolerance: Patient tolerated treatment well Patient left: in chair;with call bell/phone within reach;with chair alarm set Nurse Communication: Mobility status PT Visit Diagnosis: Unsteadiness on feet (R26.81);Difficulty in walking, not elsewhere classified (R26.2)    Time: 8588-5027 PT Time Calculation (min) (ACUTE ONLY): 22 min   Charges:   PT Evaluation $PT Eval Moderate Complexity: 1 Mod          Wyona Almas, PT, DPT Acute Rehabilitation Services Office [phone]   Deno Etienne 11/06/2022, 10:04 AM

## 2022-11-06 NOTE — Progress Notes (Signed)
   NAME:  Brent Allison, MRN:  [id number], DOB:  Jul 14, 1946, LOS: 7 ADMISSION DATE:  11/09/2022, CONSULTATION DATE:  11/06/22 REFERRING MD:  Dr. Alvino Chapel ED, CHIEF COMPLAINT: Abdominal pain  History of Present Illness:  76 year old with known history of below presents with acute onset of abdominal pain about 7:30 PM, peritoneal exam, found to have viscus perforation on CT of the abdomen.  Acute onset of pain.  CT scan revealed intestinal perforation.  He takes Eliquis for VTE history.  Last dose this morning.  Received Andexxa in the ED for reversal.  Surgery was consulted.  Taken to the OR.  They recommended medical ICU admission versus surgical admission given complex medical history.  Lab notable for mild elevation in creatinine, AKI, mild leukocytosis with left shift, hemoglobin appears at baseline.  Blood cultures were obtained.  Patient was transferred out of ICU on 12/13  PCCM was reconsulted on 12/5 because of blood-tinged secretions  Pertinent  Medical History  History of amyloidosis, history of diastolic dysfunction although euvolemic on exam, history of DVT  Significant Hospital Events: Including procedures, antibiotic start and stop dates in addition to other pertinent events   11/10/2022 presents with acute onset abdominal pain found to have abdominal perforation 10/31/22  - Surgery : POD 0 s/p Ex lap, transverse colectomy and placement of abthera wound vac for bowel perforation, feculent peritonitis, mesenteric ischemia by Dr. Redmond Pulling on 10/31/22. - Intra-Op Findings: significant stool contamination, old periumbilical mesh, omentum and colonic tissue very friable and essentially fell apart, silk sutures marking proximal transverse & distal transverse colon.  On Eliquis at baseline. Andexxa pre-op. T  11/21/2022 - surgery: POD 0 s/p ex-lap, creation of colostomy and abdominal closure with retention sutures by Dr. Grandville Silos; holding [facility]. Tolerating PS 5 over 5, extubate.  Interim  History / Subjective:  Still coughing up blood. Otherwise breathing is okay.  Objective   Blood pressure (!) 124/58, pulse 87, temperature 98.1 F (36.7 C), temperature source Oral, resp. rate (!) 27, height 6' (1.829 m), weight 94.9 kg, SpO2 100 %.        Intake/Output Summary (Last 24 hours) at 11/06/2022 1128 Last data filed at 11/06/2022 0803 Gross per 24 hour  Intake 1971.9 ml  Output 1350 ml  Net 621.9 ml    Filed Weights   11/04/22 0500 11/05/22 0506 11/06/22 0341  Weight: 96.8 kg 94.5 kg 94.9 kg    No distress sitting in chair Dried blood on tongue Some rhonci Abd slightly distended, colostomy in place Ext with muscle wasting, trace edema   Resolved Hospital Problem list   Lactic acidosis - present on admission - resolved 12/12  Assessment & Plan:  Acute peritonitis Mesenteric ischemia with bowel perforation status post ex lap with partial colectomy and ostomy Acute hypoxic respiratory failure Recurrent DVTs AKI on CKD stage IIIa due to septic ATN Acute on chronic blood loss anemia, perioperative Prediabetes Demand cardiac ischemia Thrombocytopenia in setting of sepsis Hemoptysis  - TXA nebs x 48h - Check sputum culture - Check AM CXR - Rechallenge with AC if hemoptysis improves - If it does not improve we might be stuck with IVC filter  Erskine Emery MD PCCM [facility] See Amion for pager If no response to pager, please call [phone] until 7pm After 7pm, Please call E-link [phone]

## 2022-11-06 NOTE — Progress Notes (Signed)
Patient has home unit CPAP. Patient does not wish to wear it tonight.

## 2022-11-06 NOTE — Progress Notes (Signed)
ANTICOAGULATION CONSULT NOTE - follow up  Pharmacy Consult:  Argatroban  (currently on hold due to hemoptysis) Indication: DVT  Allergies  Allergen Reactions   Enoxaparin Other (See Comments)    Blood clots HIT [SRA +]   Heparin Other (See Comments)    Heparin induced thrombocytopenia (SRA +)    Patient Measurements: Height: 6' (182.9 cm) Weight: 94.9 kg (209 lb 3.5 oz) IBW/kg (Calculated) : 77.6  Vital Signs: Temp: 98.4 F (36.9 C) (12/16 0801) Temp Source: Oral (12/16 0801) BP: 124/67 (12/16 0801) Pulse Rate: 95 (12/16 0801)  Labs: Recent Labs    11/04/22 0026 11/05/22 0027 11/05/22 1215 11/06/22 0357 11/06/22 0522  HGB 8.8* 8.6*  --  7.3*  --   HCT 26.6* 25.9*  --  23.2*  --   PLT 120* 133*  --  123*  --   APTT 66* 65* 63*  --   --   CREATININE 1.50* 1.37*  --   --  1.25*     Estimated Creatinine Clearance: 59.2 mL/min (A) (by C-G formula based on SCr of 1.25 mg/dL (H)).   Medical History: Past Medical History:  Diagnosis Date   AL amyloidosis ([place]) 11/04/2012   Arthritis    lumbar DDD   Blood dyscrasia    plasma cell dyscrasia with associated amyloidosis   CKD (chronic kidney disease)    DVT (deep venous thrombosis) ([facility])    right   Dyslipidemia    H/O multiple myeloma    Heparin induced thrombocytopenia (HIT) ([place]) 11/04/2012   Joint pain    Peripheral vascular disease ([place])    DVT- 01/2012, follows by Dr. [name], lovenox maintained since Spring 2013 , pt. aware that last dose is sch. for 07/22/2012   Pneumonia    hx of with last time in Nov 2012   Prostate cancer [facility])    amyloidosis, multiple myeloma    Sleep apnea    severe with AHI 44.4 events per hour - now on CPAP at 11cm H2O   Spastic quadriparesis [facility])    Assessment: 60 YOM with history of recurerrent DVT in March 2023 on Eliquis PTA.  Patient admitted with a perforated bowel requiring reversal for surgery. Pharmacy consulted to dose argatroban while Eliquis on hold given history of  HIT. Hgb trending down to 7.3 today, plts stable at 123K today. Patient is still experiencing hemoptysis this morning. Per discussion with Dr. Tana Coast, will continue to hold argatroban. If bleeding does not resolve, he may require IVC.   Goal of Therapy:  aPTT 50-90 seconds Monitor platelets by anticoagulation protocol: Yes   Plan:  Argatroban infusion is on hold.  Will follow up tomorrow for anticoagulation plan  Thank you for allowing pharmacy to participate in this patient's care.  Reatha Harps, PharmD PGY2 Pharmacy Resident 11/06/2022 8:59 AM Check [email or web address] for unit specific pharmacy number

## 2022-11-06 NOTE — Progress Notes (Signed)
5 Days Post-Op   Subjective/Chief Complaint: Pt doing well today Tol clears Bowel fxn into ostomy   Objective: Vital signs in last 24 hours: Temp:  [98.1 F (36.7 C)-98.5 F (36.9 C)] 98.1 F (36.7 C) (12/16 0341) Pulse Rate:  [93-98] 94 (12/16 0341) Resp:  [24-25] 25 (12/16 0341) BP: (121-133)/(53-65) 125/53 (12/16 0341) SpO2:  [90 %-98 %] 97 % (12/16 0341) Weight:  [94.9 kg] 94.9 kg (12/16 0341) Last BM Date : 11/05/22  Intake/Output from previous day: 12/15 0701 - 12/16 0700 In: 1831.9 [P.O.:460; I.V.:1163.9; IV Piggyback:208] Out: 1350 [Urine:1150; Stool:200] Intake/Output this shift: No intake/output data recorded.  PE: Gen:  Alert, NAD, pleasant Card:  HR 90's while I was in the room  Pulm:  CTAB, no W/R/R, effort normal Abd: Mild distension that is improved from when I saw him earlier in the week. His abdomen is very soft. Appropriately ttp. No rigidity or guarding. Ostomy bag with liquid brown stool in bag. Stoma is only able to be partially visualized through ostomy pouch window 2/2 stool and is budded and viable appearing. Midline wound clean with retention sutures in place  Lab Results:  Recent Labs    11/05/22 0027 11/06/22 0357  WBC 11.7* 9.3  HGB 8.6* 7.3*  HCT 25.9* 23.2*  PLT 133* 123*   BMET Recent Labs    11/05/22 0027 11/06/22 0522  NA 141 143  K 3.5 3.7  CL 111 112*  CO2 24 24  GLUCOSE 125* 134*  BUN 24* 23  CREATININE 1.37* 1.25*  CALCIUM 8.0* 7.9*   PT/INR No results for input(s): "LABPROT", "INR" in the last 72 hours. ABG No results for input(s): "PHART", "HCO3" in the last 72 hours.  Invalid input(s): "PCO2", "PO2"  Studies/Results: [place] EKG SITE RITE  Result Date: 11/05/2022 If Site Rite image not attached, placement could not be confirmed due to current cardiac rhythm.  [place] EKG SITE RITE  Result Date: 11/04/2022 If Site Rite image not attached, placement could not be confirmed due to current cardiac rhythm.    Anti-infectives: Anti-infectives (From admission, onward)    Start     Dose/Rate Route Frequency Ordered Stop   11/02/22 1200  piperacillin-tazobactam (ZOSYN) IVPB 3.375 g        3.375 g 12.5 mL/hr over 240 Minutes Intravenous Every 8 hours 11/02/22 0942 11/06/22 0120   10/31/22 0400  piperacillin-tazobactam (ZOSYN) IVPB 3.375 g  Status:  Discontinued        3.375 g 12.5 mL/hr over 240 Minutes Intravenous Every 8 hours 11/04/2022 2330 11/02/22 0942   10/23/2022 2130  piperacillin-tazobactam (ZOSYN) IVPB 3.375 g        3.375 g 100 mL/hr over 30 Minutes Intravenous  Once 11/09/2022 2120 11/15/2022 2221       Assessment/Plan: POD 6 s/p Ex lap, transverse colectomy and placement of abthera wound vac for bowel perforation, feculent peritonitis, mesenteric ischemia by Dr. Redmond Pulling on 10/31/22 POD 5 s/p ex lap, creation of colostomy and abdominal closure including retention sutures on 12/11 by Dr. Grandville Silos - Intra-Op Findings from 12/10: significant stool contamination, old periumbilical mesh, omentum and colonic tissue very friable and essentially fell apart, silk sutures marking proximal transverse & distal transverse colon  - Ileus  Sounds like it is improving as patient has some ROBF but still with bloating/distension and intermittent nausea, burping/belching w/ minimal po intake on cld. Will adv to fulls liq today. Cont TPN until tol PO well  - WTD BID between retention sutures - Cont  abx through 12/16 - Mobilize, PT consult - Pulm toilet - On argatroban infusion, resume DOAC when taking PO better   FEN - fulls, boost breeze, TPN VTE - SCDs, On Eliquis at baseline. Argatroban gtt (hx HIT) ID - Zosyn (stop date 12/16). Afebrile. WBC 11.7 Foley - out and voiding.    - Per CCM -  AKI on CKD3A - Cr improving  Amyloidosis  History of recurrent VTE: Most recently DVT in 01/2022 - On Eliquis at baseline. Argatroban gtt (hx HIT) NSTEMI ABL anemia - hgb stable   LOS: 7 days    Ralene Ok 11/06/2022

## 2022-11-06 NOTE — Progress Notes (Signed)
[facility]                                                                              Brent Allison, is a 76 y.o. male, DOB - Jun 12, 1946, ILN:[id number] Admit date - 11/16/2022    Outpatient Primary MD for the patient is Jacalyn Lefevre, Jesse Sans, MD  LOS - 7  days  Chief Complaint  Patient presents with   Abdominal Pain    Patient BIB by [facility] from home with complaint of sudden onset of severe abdominal pain after eating dinner, reports stabbing pain.         Brief summary   Patient is a 76 year old male with history of diastolic CHF, recurrent DVT on Eliquis, CKD stage IIIa, amyloidosis, HLP, multiple myeloma, thrombocytopenia with HIT (diagnosed 10/2012), PVD, OSA presented to ED with acute onset of abdominal pain on the day of admission.  He was found to have intestinal perforation on CT scan.  Patient received Andexxa in ED for reversal of eliquis and surgery was consulted. In ED, BP 96/65, heart rate 106, temp 97.5 F, respiratory rate 33, leukocytosis WBCs 11.3, creatinine 1.75  Patient was transferred to the floor on 11/03/2022, [facility] assumed care on 12/14  Significant Hospital Events:   10/31/2022: Presented with acute abdominal pain, CT with intestinal perforation  10/31/22  -surgery s/p ex lap, transverse colectomy and placement of abthera wound vac for bowel perforation, feculent peritonitis, mesenteric ischemia by Dr. Redmond Pulling on 10/31/22.  Intra-Op Findings: significant stool contamination, old periumbilical mesh, omentum and colonic tissue very friable and essentially fell apart, silk sutures marking proximal transverse & distal transverse colon.  On Eliquis at baseline. Andexxa pre-op.  11/12/2022 - surgery: s/p ex-lap, creation of colostomy and abdominal closure with retention sutures by Dr. Grandville Silos.  11/02/2022: extubated   Assessment & Plan    Principal Problem: Severe sepsis secondary to bowel perforation, feculent peritonitis, mesenteric ischemia- POA  S/p   ex-lap with transverse colectomy and placement of abthera wound vac (Dr. Redmond Pulling, 10/31/2022) S/p ex-lap with creation of colostomy and abdominal closure with retention sutures (Dr. Grandville Silos 10/31/2022) -Patient met severe sepsis criteria on admission secondary to mesenteric ischemia, bowel perforation, peritonitis.  Extubated and off vasopressors. -Continue IV Zosyn, stop date on 12/16 unless recommended to continue by surgery -Blood cultures NTD -Continue TPN until tolerating p.o., diet advanced by surgery to full liquids -Postop management deferred to surgery   Active Problems:  Acute respiratory failure with hypoxia secondary to above -Extubated on 11/02/2022,  -Continue I-S, mobilization, wean O2 as tolerated -O2 sats 97 to 100% on 3 L  Hemoptysis  -Since 12/14 night, patient reported hemoptysis couple of times last night with suctioning and cough -Argatroban on hold, plan on IVC filter early next week per CCM if AC remains on hold.  History of recurrent VTE and HIT -Most recently in 01/2022, on indefinite anticoagulation with Eliquis.  Outpatient follow-up with Dr. Ammie Dalton -Started on argatroban on 12/13, plan was to resume Eliquis closer to discharge when patient is taking p.o.'s consistently -Currently argatroban on hold  Acute on chronic blood loss anemia secondary to multiple surgeries, now hemoptysis -Hemoglobin 7.3,  will transfuse 1 unit packed RBCs  Mild AKI on CKD stage IIIa -Baseline clear 9 1.3-1.5, presented with creatinine of 1.75 on admission, plateaued at 1.8 -Creatinine 1.3, closer to baseline  Elevated troponins, type II NSTEMI, QTc prolongation -Likely due to demand ischemia from severe sepsis, mesenteric ischemia, bowel perforation with peritonitis -2D echo 12/10 showed EF of 50 to 55%, no regional WMA, indeterminate diastolic parameters -Avoid QT prolonging meds   History of multiple myeloma, AL amyloidosis -MM diagnosed in 2012, AL amyloidosis diagnosed  in 2013, outpatient follow-up with Dr. Ammie Dalton  Generalized debility -PT OT evaluation once able to tolerate   Moderate protein calorie malnutrition Etiology: chronic illness (amyloidosis, CHF, CKD IIIa) Signs/Symptoms: mild fat depletion, moderate muscle depletion Interventions: Tube feeding Estimated body mass index is 28.37 kg/m as calculated from the following:   Height as of this encounter: 6' (1.829 m).   Weight as of this encounter: 94.9 kg.  Code Status: Full code DVT Prophylaxis:  SCDs Start: 11/06/2022 2300  Level of Care: Level of care: Progressive Family Communication: Updated patient's daughter, Brent Allison on 12/15  Disposition Plan:      Remains inpatient appropriate:    Procedures:  12/10 s/p Ex lap, transverse colectomy and placement of abthera wound vac for bowel perforation, feculent peritonitis, mesenteric ischemia by Dr. Redmond Pulling  12/11 ex lap, creation of colostomy and abdominal closure including retention sutures by Dr. Grandville Silos   Consultants:   Admitted by CCM General surgery   Antimicrobials:   Anti-infectives (From admission, onward)    Start     Dose/Rate Route Frequency Ordered Stop   11/02/22 1200  piperacillin-tazobactam (ZOSYN) IVPB 3.375 g        3.375 g 12.5 mL/hr over 240 Minutes Intravenous Every 8 hours 11/02/22 0942 11/06/22 0120   10/31/22 0400  piperacillin-tazobactam (ZOSYN) IVPB 3.375 g  Status:  Discontinued        3.375 g 12.5 mL/hr over 240 Minutes Intravenous Every 8 hours 11/09/2022 2330 11/02/22 0942   10/29/2022 2130  piperacillin-tazobactam (ZOSYN) IVPB 3.375 g        3.375 g 100 mL/hr over 30 Minutes Intravenous  Once 11/03/2022 2120 11/20/2022 2221          Medications  sodium chloride   Intravenous Once   Chlorhexidine Gluconate Cloth  6 each Topical Daily   feeding supplement  1 Container Oral TID BM   insulin aspart  0-9 Units Subcutaneous Q4H   mouth rinse  15 mL Mouth Rinse 4 times per day   pantoprazole  (PROTONIX) IV  40 mg Intravenous Q24H   sodium chloride flush  10-40 mL Intracatheter Q12H   tranexamic acid  500 mg Nebulization Q8H      Subjective:   Brent Allison was seen and examined today.  Still had hemoptysis last night with suctioning and coughing with phlegm.  No fever or chills, chest pain, nausea or vomiting.    Objective:   Vitals:   11/05/22 2329 11/06/22 0341 11/06/22 0801 11/06/22 1047  BP: 123/65 (!) 125/53 124/67 (!) 124/58  Pulse: 97 94 95 87  Resp: (!) 24 (!) 25 20 (!) 27  Temp: 98.1 F (36.7 C) 98.1 F (36.7 C) 98.4 F (36.9 C) 98.1 F (36.7 C)  TempSrc: Oral Oral Oral Oral  SpO2: 96% 97% 100% 100%  Weight:  94.9 kg    Height:        Intake/Output Summary (Last 24 hours) at 11/06/2022 1159 Last data filed at 11/06/2022 [phone]  Gross per 24 hour  Intake 1971.9 ml  Output 1350 ml  Net 621.9 ml     Wt Readings from Last 3 Encounters:  11/06/22 94.9 kg  10/21/22 88.6 kg  10/05/22 86.7 kg    Physical Exam General: Alert and oriented x 3, NAD Cardiovascular: S1 S2 clear, RRR.  Respiratory: Scattered bilaterally Gastrointestinal: Soft, mildly stented, colostomy in place with liquid brown stools  Ext: trace pedal edema bilaterally Neuro: no new deficits Skin: Dressing intact  Data Reviewed:  I have personally reviewed following labs    CBC Lab Results  Component Value Date   WBC 9.3 11/06/2022   RBC 2.23 (L) 11/06/2022   HGB 7.3 (L) 11/06/2022   HCT 23.2 (L) 11/06/2022   MCV 104.0 (H) 11/06/2022   MCH 32.7 11/06/2022   PLT 123 (L) 11/06/2022   MCHC 31.5 11/06/2022   RDW 14.4 11/06/2022   LYMPHSABS 0.6 (L) 11/02/2022   MONOABS 0.3 11/02/2022   EOSABS 0.0 11/02/2022   BASOSABS 0.0 38/18/4037     Last metabolic panel Lab Results  Component Value Date   NA 143 11/06/2022   K 3.7 11/06/2022   CL 112 (H) 11/06/2022   CO2 24 11/06/2022   BUN 23 11/06/2022   CREATININE 1.25 (H) 11/06/2022   GLUCOSE 134 (H) 11/06/2022   GFRNONAA  59 (L) 11/06/2022   GFRAA >60 04/24/2019   CALCIUM 7.9 (L) 11/06/2022   PHOS 2.6 11/06/2022   PROT 3.9 (L) 11/05/2022   ALBUMIN 1.8 (L) 11/05/2022   LABGLOB 3.1 01/12/2022   AGRATIO 1.3 04/24/2019   BILITOT 0.9 11/05/2022   ALKPHOS 80 11/05/2022   AST 40 11/05/2022   ALT 47 (H) 11/05/2022   ANIONGAP 7 11/06/2022    CBG (last 3)  Recent Labs    11/06/22 0347 11/06/22 0806 11/06/22 1050  GLUCAP 150* 141* 143*      Coagulation Profile: Recent Labs  Lab 11/16/2022 2101  INR 1.6*     Radiology Studies: I have personally reviewed the imaging studies  [place] EKG SITE RITE  Result Date: 11/05/2022 If Site Rite image not attached, placement could not be confirmed due to current cardiac rhythm.      Estill Cotta M.D. [facility] 11/06/2022, 11:59 AM  Available via Epic secure chat 7am-7pm After 7 pm, please refer to night coverage provider listed on amion.

## 2022-11-06 NOTE — Progress Notes (Signed)
   11/06/22 1220  Mobility  Activity Transferred from chair to bed  Level of Assistance Moderate assist, patient does 50-74%  Assistive Device Front wheel walker  Distance Ambulated (ft) 2 ft  Activity Response Tolerated well  Mobility Referral Yes  $Mobility charge 1 Mobility   Mobility Specialist Progress Note  Pt requesting to get back to bed. Had no c/o pain. Left in bed w/ all needs met and call bell in reach.   Lucious Groves Mobility Specialist  Please contact via SecureChat or Rehab office at [phone]

## 2022-11-06 NOTE — Progress Notes (Signed)
Inpatient Rehab Admissions Coordinator Note:   Per therapy patient was screened for CIR candidacy by Jorgen Wolfinger Danford Bad, CCC-SLP. At this time, pt appears to be a potential candidate for CIR. I will place an order for rehab consult for full assessment, per our protocol.  Please contact me any with questions.Gayland Curry, [place], [place] Admissions Coordinator [phone] 11/06/22 5:07 PM

## 2022-11-06 NOTE — Progress Notes (Signed)
PHARMACY - TOTAL PARENTERAL NUTRITION CONSULT NOTE   Indication: Prolonged ileus  Patient Measurements: Height: 6' (182.9 cm) Weight: 94.9 kg (209 lb 3.5 oz) IBW/kg (Calculated) : 77.6 TPN AdjBW (KG): 96.9 Body mass index is 28.37 kg/m.  Assessment: 76 yo male admitted on 12/9 for abdominal pain found to have peritonitis due to transverse colon perforation possibly secondary to mesenteric ischemia. S/p ex lap, creating of colostomy and abdominal closure on 12/11. Patient was extubated on 12/12 and started on a clear liquid diet, however has had minimal PO intake since admission and unable to tolerate nutritional supplements due to severe nausea. Pharmacy has been consulted to dose TPN.   +9L thus far this admit and +1 edema noted- d/w RD and will concentrate TPN.   Overnight 12/14, patient removed PICC line and TPN was stopped. Patient had D5LR running at 50 mL/hr. No hypoglycemic episodes per d/w RN. Planning to replace PICC line today.   Glucose / Insulin: hx pre-DM, A1c 5.7%. On D5NS @ 76m/hr. CBGs <130. Used 1 unit of sSSI/24 hours. No DM meds PTA. Electrolytes: Na 143 (on D5NS), K 3.7, Cl 112, CoCa 9.8, Mg 2.2, Phos 2.6 Renal: Scr down at 1.25, BUN down at 23 (bsl Scr ~1.5-1.7) Hepatic: LFTs wnl; Alb low at 1.8, alk phos wnl 80, Tbili wnl at 0.9, TG 111 Intake / Output: up ~8L this admit; UOP 0.7 mL/kg/hr. 2038mostomy output MIVF: D5NS @ 50 mL/hr  GI Imaging: - 12/9 CTAP: suspected pneumatosis, concern for mesenteric ischemia GI Surgeries / Procedures:  - 12/10 s/p Ex lap, transverse colectomy and placement of abthera wound vac for bowel perforation, feculent peritonitis, mesenteric ischemia - 12/11 s/p ex lap, creation of colostomy and abdominal closure including retention sutures  Central access: ordered to be placed 12/14 TPN start date: 12/14  Nutritional Goals: Goal TPN rate is 80 mL/hr (provides 123 g of protein and 2293 kcals per day meeting 100% of needs)  RD  Assessment: Estimated Needs Total Energy Estimated Needs: 2200-2400 Total Protein Estimated Needs: 120-140 grams Total Fluid Estimated Needs: >2.0 L  Current Nutrition:  12/14 TPN 12/15 CLD - Boost Breeze TID as tolerated (intermittently refusing d/t nausea) 12/16 advancing to full liquids  Plan:  Continue concentrated TPN at 4075mr at 1800 (half goal TPN - will provide 61g AA and 1146 kCal meeting 50% of patient needs) Electrolytes in TPN: Na 56m27m, K 55mE82m Ca 5mEq/34mMg 5mEq/L46mnd Phos 17mmol/41ml:Ac 1:2 Add standard trace elements and MVI to TPN Continue Sensitive q4h SSI and adjust as needed  Continue D5NS to 50 mL/hr per MD Monitor TPN labs on Mon/Thurs Watch volume status and adjust concentrated TPN to standard TPN as able F/u diet toleration, wean TPN as able  Thank you for allowing pharmacy to be a part of this patient's care.  AdrienneArdyth Harps Clinical Pharmacist

## 2022-11-07 ENCOUNTER — Inpatient Hospital Stay (HOSPITAL_COMMUNITY): Payer: Medicare Other

## 2022-11-07 DIAGNOSIS — K668 Other specified disorders of peritoneum: Secondary | ICD-10-CM | POA: Diagnosis not present

## 2022-11-07 DIAGNOSIS — E440 Moderate protein-calorie malnutrition: Secondary | ICD-10-CM | POA: Diagnosis not present

## 2022-11-07 DIAGNOSIS — K631 Perforation of intestine (nontraumatic): Secondary | ICD-10-CM | POA: Diagnosis not present

## 2022-11-07 DIAGNOSIS — R198 Other specified symptoms and signs involving the digestive system and abdomen: Secondary | ICD-10-CM | POA: Diagnosis not present

## 2022-11-07 LAB — CBC
HCT: 26.6 % — ABNORMAL LOW (ref 39.0–52.0)
Hemoglobin: 8.8 g/dL — ABNORMAL LOW (ref 13.0–17.0)
MCH: 32.7 pg (ref 26.0–34.0)
MCHC: 33.1 g/dL (ref 30.0–36.0)
MCV: 98.9 fL (ref 80.0–100.0)
Platelets: 143 10*3/uL — ABNORMAL LOW (ref 150–400)
RBC: 2.69 MIL/uL — ABNORMAL LOW (ref 4.22–5.81)
RDW: 14.6 % (ref 11.5–15.5)
WBC: 11.8 10*3/uL — ABNORMAL HIGH (ref 4.0–10.5)
nRBC: 0 % (ref 0.0–0.2)

## 2022-11-07 LAB — GLUCOSE, CAPILLARY
Glucose-Capillary: 118 mg/dL — ABNORMAL HIGH (ref 70–99)
Glucose-Capillary: 131 mg/dL — ABNORMAL HIGH (ref 70–99)
Glucose-Capillary: 134 mg/dL — ABNORMAL HIGH (ref 70–99)
Glucose-Capillary: 143 mg/dL — ABNORMAL HIGH (ref 70–99)
Glucose-Capillary: 150 mg/dL — ABNORMAL HIGH (ref 70–99)
Glucose-Capillary: 163 mg/dL — ABNORMAL HIGH (ref 70–99)

## 2022-11-07 LAB — APTT: aPTT: 44 seconds — ABNORMAL HIGH (ref 24–36)

## 2022-11-07 LAB — BASIC METABOLIC PANEL
Anion gap: 5 (ref 5–15)
BUN: 22 mg/dL (ref 8–23)
CO2: 23 mmol/L (ref 22–32)
Calcium: 7.7 mg/dL — ABNORMAL LOW (ref 8.9–10.3)
Chloride: 114 mmol/L — ABNORMAL HIGH (ref 98–111)
Creatinine, Ser: 1.1 mg/dL (ref 0.61–1.24)
GFR, Estimated: >60 mL/min (ref >60)
Glucose, Bld: 130 mg/dL — ABNORMAL HIGH (ref 70–99)
Potassium: 3.8 mmol/L (ref 3.5–5.1)
Sodium: 142 mmol/L (ref 135–145)

## 2022-11-07 LAB — PHOSPHORUS: Phosphorus: 2.6 mg/dL (ref 2.5–4.6)

## 2022-11-07 LAB — MAGNESIUM: Magnesium: 1.9 mg/dL (ref 1.7–2.4)

## 2022-11-07 MED ORDER — IOHEXOL 9 MG/ML PO SOLN
500.0000 mL | ORAL | Status: DC
  Administered 2022-11-07: 500 mL via ORAL

## 2022-11-07 MED ORDER — TRACE MINERALS CU-MN-SE-ZN 300-55-60-3000 MCG/ML IV SOLN
INTRAVENOUS | Status: AC
  Filled 2022-11-07: qty 409.6

## 2022-11-07 MED ORDER — MELATONIN 3 MG PO TABS
3.0000 mg | ORAL_TABLET | Freq: Every day | ORAL | Status: DC
  Administered 2022-11-07: 3 mg via ORAL
  Filled 2022-11-07: qty 1

## 2022-11-07 MED ORDER — LACTATED RINGERS IV BOLUS
500.0000 mL | Freq: Once | INTRAVENOUS | Status: AC
  Administered 2022-11-08: 500 mL via INTRAVENOUS

## 2022-11-07 MED ORDER — HYDROMORPHONE HCL 1 MG/ML IJ SOLN
1.0000 mg | Freq: Once | INTRAMUSCULAR | Status: AC
  Administered 2022-11-07: 1 mg via INTRAVENOUS
  Filled 2022-11-07: qty 1

## 2022-11-07 MED ORDER — IOHEXOL 9 MG/ML PO SOLN
ORAL | Status: AC
  Administered 2022-11-07: 500 mL
  Filled 2022-11-07: qty 1000

## 2022-11-07 MED ORDER — BISACODYL 10 MG RE SUPP
10.0000 mg | Freq: Once | RECTAL | Status: DC
  Filled 2022-11-07: qty 1

## 2022-11-07 MED ORDER — INSULIN ASPART 100 UNIT/ML IJ SOLN
0.0000 [IU] | Freq: Four times a day (QID) | INTRAMUSCULAR | Status: DC
  Administered 2022-11-07 (×2): 1 [IU] via SUBCUTANEOUS
  Administered 2022-11-08: 2 [IU] via SUBCUTANEOUS

## 2022-11-07 MED ORDER — SIMETHICONE 80 MG PO CHEW: 80.0000 mg | CHEWABLE_TABLET | Freq: Four times a day (QID) | ORAL | Status: DC | PRN

## 2022-11-07 NOTE — Progress Notes (Signed)
ANTICOAGULATION CONSULT NOTE - follow up  Pharmacy Consult:  Argatroban  (currently on hold due to hemoptysis) Indication: DVT  Allergies  Allergen Reactions   Enoxaparin Other (See Comments)    Blood clots HIT [SRA +]   Heparin Other (See Comments)    Heparin induced thrombocytopenia (SRA +)    Patient Measurements: Height: 6' (182.9 cm) Weight: 97.2 kg (214 lb 4.6 oz) IBW/kg (Calculated) : 77.6  Vital Signs: Temp: 98.1 F (36.7 C) (12/17 0750) Temp Source: Oral (12/17 0750) BP: 132/62 (12/17 0750) Pulse Rate: 89 (12/17 0750)  Labs: Recent Labs    11/05/22 0027 11/05/22 1215 11/06/22 0357 11/06/22 0522 11/07/22 0345  HGB 8.6*  --  7.3*  --  8.8*  HCT 25.9*  --  23.2*  --  26.6*  PLT 133*  --  123*  --  143*  APTT 65* 63*  --   --   --   CREATININE 1.37*  --   --  1.25* 1.10     Estimated Creatinine Clearance: 67.9 mL/min (by C-G formula based on SCr of 1.1 mg/dL).   Medical History: Past Medical History:  Diagnosis Date   AL amyloidosis ([place]) 11/04/2012   Arthritis    lumbar DDD   Blood dyscrasia    plasma cell dyscrasia with associated amyloidosis   CKD (chronic kidney disease)    DVT (deep venous thrombosis) ([facility])    right   Dyslipidemia    H/O multiple myeloma    Heparin induced thrombocytopenia (HIT) ([place]) 11/04/2012   Joint pain    Peripheral vascular disease ([place])    DVT- 01/2012, follows by Dr. [name], lovenox maintained since Spring 2013 , pt. aware that last dose is sch. for 07/22/2012   Pneumonia    hx of with last time in Nov 2012   Prostate cancer [facility])    amyloidosis, multiple myeloma    Sleep apnea    severe with AHI 44.4 events per hour - now on CPAP at 11cm H2O   Spastic quadriparesis [facility])    Assessment: 33 YOM with history of recurerrent DVT in March 2023 on Eliquis PTA.  Patient admitted with a perforated bowel requiring reversal for surgery. Pharmacy consulted to dose argatroban while Eliquis on hold given history of HIT. Hgb  stable at 8.8 today after 1 u PRBC yesterday, plts stable at 143K today. Hemoptysis has improved with TXA nebs. Per discussion with CCM, will resume argatroban. If bleeding does not resolve, he may require IVC.   Goal of Therapy:  aPTT 50-90 seconds Monitor platelets by anticoagulation protocol: Yes   Plan:  Argatroban resumed at 0.5 mcg/kg/min Will follow up tomorrow for anticoagulation plan  Thank you for allowing pharmacy to participate in this patient's care.  Reatha Harps, PharmD PGY2 Pharmacy Resident 11/07/2022 10:10 AM Check [email or web address] for unit specific pharmacy number

## 2022-11-07 NOTE — Progress Notes (Signed)
Mobility Specialist Progress Note    11/07/22 1153  Mobility  Activity Ambulated with assistance in room  Level of Assistance Moderate assist, patient does 50-74%  Assistive Device Front wheel walker  Distance Ambulated (ft) 16 ft  Activity Response Tolerated well  Mobility Referral Yes  $Mobility charge 1 Mobility   Post-Mobility: 92 HR, 96% SpO2  Pt received with legs hanging over EOB and agreeable. No complaints. Left in chair with call bell in reach and daughter present.   Hildred Alamin Mobility Specialist  Please Psychologist, sport and exercise or Rehab Office at [phone]

## 2022-11-07 NOTE — Progress Notes (Signed)
PHARMACY - TOTAL PARENTERAL NUTRITION CONSULT NOTE   Indication: Prolonged ileus  Patient Measurements: Height: 6' (182.9 cm) Weight: 97.2 kg (214 lb 4.6 oz) IBW/kg (Calculated) : 77.6 TPN AdjBW (KG): 96.9 Body mass index is 29.06 kg/m.  Assessment: 76 yo male admitted on 12/9 for abdominal pain found to have peritonitis due to transverse colon perforation possibly secondary to mesenteric ischemia. S/p ex lap, creating of colostomy and abdominal closure on 12/11. Patient was extubated on 12/12 and started on a clear liquid diet, however has had minimal PO intake since admission and unable to tolerate nutritional supplements due to severe nausea. Pharmacy has been consulted to dose TPN.   +9L thus far this admit and +1 edema noted- d/w RD and will concentrate TPN.   Glucose / Insulin: hx pre-DM, A1c 5.7%. On D5NS @ 18m/hr. CBGs <130. Used 3 unit of sSSI/24 hours. No DM meds PTA. Electrolytes: Na 142 (on D5NS), K 3.8, Cl 114, CoCa 9.8, Mg 1.9, Phos 2.6 Renal: Scr down 1.1, BUN 22 (bsl Scr ~1.5-1.7) Hepatic: LFTs wnl; Alb low at 1.8, alk phos wnl 80, Tbili wnl at 0.9, TG 111 Intake / Output: up ~10L this admit; UO/BM not charted for 12/16  MIVF: D5NS @ 50 mL/hr  GI Imaging: - 12/9 CTAP: suspected pneumatosis, concern for mesenteric ischemia GI Surgeries / Procedures:  - 12/10 s/p Ex lap, transverse colectomy and placement of abthera wound vac for bowel perforation, feculent peritonitis, mesenteric ischemia - 12/11 s/p ex lap, creation of colostomy and abdominal closure including retention sutures  Central access: ordered to be placed 12/14 TPN start date: 12/14  Nutritional Goals: Goal TPN rate is 80 mL/hr (provides 123 g of protein and 2293 kcals per day meeting 100% of needs)  RD Assessment: Estimated Needs Total Energy Estimated Needs: 2200-2400 Total Protein Estimated Needs: 120-140 grams Total Fluid Estimated Needs: >2.0 L  Current Nutrition:  12/14 TPN 12/15 CLD - Boost  Breeze TID as tolerated (intermittently refusing d/t nausea) 12/16 advancing to full liquids - Boost x2 12/16  Plan:  Continue concentrated TPN at 471mhr at 1800 (half goal TPN - will provide 61g AA and 1146 kCal meeting 50% of patient needs) Electrolytes in TPN: Na 1564mL, K 71m20m, Ca 5mEq42m Mg 8mEq/84mand Phos 17mmol22mMax Acetate Add standard trace elements and MVI to TPN Adjust Sensitive q6h SSI and adjust as needed  Continue D5NS to 50 mL/hr per MD Monitor TPN labs on Mon/Thurs Watch volume status and adjust concentrated TPN to standard TPN as able F/u diet toleration, wean TPN as able  Thank you for allowing pharmacy to be a part of this patient's care.  MadelinWilson SingerD Clinical Pharmacist 11/07/2022 7:16 AM

## 2022-11-07 NOTE — Progress Notes (Signed)
[facility]                                                                              Brent Allison, is a 76 y.o. male, DOB - 1946/10/27, VQM:[id number] Admit date - 11/04/2022    Outpatient Primary MD for the patient is Jacalyn Lefevre, Jesse Sans, MD  LOS - 8  days  Chief Complaint  Patient presents with   Abdominal Pain    Patient BIB by [facility] from home with complaint of sudden onset of severe abdominal pain after eating dinner, reports stabbing pain.         Brief summary   Patient is a 76 year old male with history of diastolic CHF, recurrent DVT on Eliquis, CKD stage IIIa, amyloidosis, HLP, multiple myeloma, thrombocytopenia with HIT (diagnosed 10/2012), PVD, OSA presented to ED with acute onset of abdominal pain on the day of admission.  He was found to have intestinal perforation on CT scan.  Patient received Andexxa in ED for reversal of eliquis and surgery was consulted. In ED, BP 96/65, heart rate 106, temp 97.5 F, respiratory rate 33, leukocytosis WBCs 11.3, creatinine 1.75  Patient was transferred to the floor on 11/03/2022, [facility] assumed care on 12/14  Significant Hospital Events:   11/18/2022: Presented with acute abdominal pain, CT with intestinal perforation  10/31/22  -surgery s/p ex lap, transverse colectomy and placement of abthera wound vac for bowel perforation, feculent peritonitis, mesenteric ischemia by Dr. Redmond Pulling on 10/31/22.  Intra-Op Findings: significant stool contamination, old periumbilical mesh, omentum and colonic tissue very friable and essentially fell apart, silk sutures marking proximal transverse & distal transverse colon.  On Eliquis at baseline. Andexxa pre-op.  11/09/2022 - surgery: s/p ex-lap, creation of colostomy and abdominal closure with retention sutures by Dr. Grandville Silos.  11/02/2022: extubated 12/15: Argatroban on hold due to hemoptysis   Assessment & Plan    Principal Problem: Severe sepsis secondary to bowel perforation, feculent  peritonitis, mesenteric ischemia- POA  S/p  ex-lap with transverse colectomy and placement of abthera wound vac (Dr. Redmond Pulling, 10/31/2022) S/p ex-lap with creation of colostomy and abdominal closure with retention sutures (Dr. Grandville Silos 10/24/2022) -Patient met severe sepsis criteria on admission secondary to mesenteric ischemia, bowel perforation, peritonitis.  Extubated and off vasopressors. -Completed IV Zosyn on 12/16.  Blood cultures NTD  -Diet advanced to full liquids, continue TPN -Management per surgery  Active Problems:  Acute respiratory failure with hypoxia secondary to above -Extubated on 11/02/2022,  -O2 sats 94 to 99% on 2 L, continue I-S, mobilization and wean O2 as tolerated  Hemoptysis  -Since 12/14 night, patient reported hemoptysis, with suctioning -Argatroban was placed on hold on 12/15, CCM following, recommending argatroban trial today. If patient develops hemoptysis, will need IVC filter  History of recurrent VTE and HIT -Most recently in 01/2022, on indefinite anticoagulation with Eliquis.  Outpatient follow-up with Dr. Ammie Dalton -Started on argatroban on 12/13, plan to resume Eliquis closer to discharge when patient is taking p.o.'s consistently or will need IVC filter if continues to have hemoptysis -Resuming argatroban today  Acute on chronic blood loss anemia secondary to multiple surgeries, now hemoptysis -Transfuse 1 unit  packed RBC on 12/16, hemoglobin stable at 8.8   Mild AKI on CKD stage IIIa -Baseline clear 9 1.3-1.5, presented with creatinine of 1.75 on admission, plateaued at 1.8 -Creatinine improved to 1.1  Elevated troponins, type II NSTEMI, QTc prolongation -Likely due to demand ischemia from severe sepsis, mesenteric ischemia, bowel perforation with peritonitis -2D echo 12/10 showed EF of 50 to 55%, no regional WMA, indeterminate diastolic parameters -Avoid QT prolonging meds  History of multiple myeloma, AL amyloidosis -MM diagnosed in 2012, AL  amyloidosis diagnosed in 2013, outpatient follow-up with Dr. Ammie Dalton  Generalized debility -PT OT evaluation once able to tolerate   Moderate protein calorie malnutrition Etiology: chronic illness (amyloidosis, CHF, CKD IIIa) Signs/Symptoms: mild fat depletion, moderate muscle depletion Interventions: Tube feeding Estimated body mass index is 29.06 kg/m as calculated from the following:   Height as of this encounter: 6' (1.829 m).   Weight as of this encounter: 97.2 kg.  Code Status: Full code DVT Prophylaxis:  SCDs Start: 10/23/2022 2300  Level of Care: Level of care: Progressive Family Communication: Updated patient's daughter, Casper Harrison today   Disposition Plan:      Remains inpatient appropriate:    Procedures:  12/10 s/p Ex lap, transverse colectomy and placement of abthera wound vac for bowel perforation, feculent peritonitis, mesenteric ischemia by Dr. Redmond Pulling  12/11 ex lap, creation of colostomy and abdominal closure including retention sutures by Dr. Grandville Silos   Consultants:   Admitted by CCM General surgery   Antimicrobials:   Anti-infectives (From admission, onward)    Start     Dose/Rate Route Frequency Ordered Stop   11/02/22 1200  piperacillin-tazobactam (ZOSYN) IVPB 3.375 g        3.375 g 12.5 mL/hr over 240 Minutes Intravenous Every 8 hours 11/02/22 0942 11/06/22 0120   10/31/22 0400  piperacillin-tazobactam (ZOSYN) IVPB 3.375 g  Status:  Discontinued        3.375 g 12.5 mL/hr over 240 Minutes Intravenous Every 8 hours 11/11/2022 2330 11/02/22 0942   11/21/2022 2130  piperacillin-tazobactam (ZOSYN) IVPB 3.375 g        3.375 g 100 mL/hr over 30 Minutes Intravenous  Once 10/24/2022 2120 10/27/2022 2221          Medications  Chlorhexidine Gluconate Cloth  6 each Topical Daily   feeding supplement  1 Container Oral TID BM   insulin aspart  0-9 Units Subcutaneous Q6H   melatonin  3 mg Oral QHS   mouth rinse  15 mL Mouth Rinse 4 times per day    pantoprazole (PROTONIX) IV  40 mg Intravenous Q24H   sodium chloride flush  10-40 mL Intracatheter Q12H   tranexamic acid  500 mg Nebulization Q8H      Subjective:   Brent Allison was seen and examined today.  Feeling better, only complaint is that he did not get enough sleep.    Otherwise no fever, chills, chest pain or acute shortness of breath.  Tolerating full liquids  Objective:   Vitals:   11/07/22 0333 11/07/22 0500 11/07/22 0614 11/07/22 0750  BP: 134/65   132/62  Pulse: 90 91 94 89  Resp: (!) 27 (!) 24 (!) 25 (!) 25  Temp: 98.6 F (37 C)   98.1 F (36.7 C)  TempSrc: Oral   Oral  SpO2: 97% 97% 99% 97%  Weight:  97.2 kg    Height:        Intake/Output Summary (Last 24 hours) at 11/07/2022 1319 Last data filed at 11/07/2022  0750 Gross per 24 hour  Intake 2403.63 ml  Output 250 ml  Net 2153.63 ml     Wt Readings from Last 3 Encounters:  11/07/22 97.2 kg  10/21/22 88.6 kg  10/05/22 86.7 kg   Physical Exam General: Alert and oriented x 3, NAD Cardiovascular: S1 S2 clear, RRR.  Respiratory: Fairly clear, minimal rhonchi Gastrointestinal: Soft, colostomy in place, liquid brown stools, distention has improved Ext: no pedal edema bilaterally Neuro: no new deficits  Data Reviewed:  I have personally reviewed following labs    CBC Lab Results  Component Value Date   WBC 11.8 (H) 11/07/2022   RBC 2.69 (L) 11/07/2022   HGB 8.8 (L) 11/07/2022   HCT 26.6 (L) 11/07/2022   MCV 98.9 11/07/2022   MCH 32.7 11/07/2022   PLT 143 (L) 11/07/2022   MCHC 33.1 11/07/2022   RDW 14.6 11/07/2022   LYMPHSABS 0.6 (L) 11/02/2022   MONOABS 0.3 11/02/2022   EOSABS 0.0 11/02/2022   BASOSABS 0.0 38/75/6433     Last metabolic panel Lab Results  Component Value Date   NA 142 11/07/2022   K 3.8 11/07/2022   CL 114 (H) 11/07/2022   CO2 23 11/07/2022   BUN 22 11/07/2022   CREATININE 1.10 11/07/2022   GLUCOSE 130 (H) 11/07/2022   GFRNONAA >60 11/07/2022   GFRAA >60  04/24/2019   CALCIUM 7.7 (L) 11/07/2022   PHOS 2.6 11/07/2022   PROT 3.9 (L) 11/05/2022   ALBUMIN 1.8 (L) 11/05/2022   LABGLOB 3.1 01/12/2022   AGRATIO 1.3 04/24/2019   BILITOT 0.9 11/05/2022   ALKPHOS 80 11/05/2022   AST 40 11/05/2022   ALT 47 (H) 11/05/2022   ANIONGAP 5 11/07/2022    CBG (last 3)  Recent Labs    11/07/22 0334 11/07/22 0756 11/07/22 1201  GLUCAP 118* 143* 150*      Coagulation Profile: No results for input(s): "INR", "PROTIME" in the last 168 hours.    Radiology Studies: I have personally reviewed the imaging studies  DG Chest Port 1 View  Result Date: 11/06/2022 CLINICAL DATA:  Follow-up pneumonia EXAM: PORTABLE CHEST 1 VIEW COMPARISON:  10/31/2022 FINDINGS: The cardiomediastinal silhouette is unchanged. Enteric tube has been removed. RIGHT PICC line is unchanged with tip overlying the LOWER SVC. LEFT LOWER lung consolidation/atelectasis is unchanged. Slightly increasing RIGHT LOWER lung opacities/effusion. There is no evidence of pneumothorax. IMPRESSION: 1. Slightly increasing RIGHT LOWER lung opacities/effusion. 2. Unchanged LEFT LOWER lung consolidation/atelectasis. Electronically Signed   By: Margarette Canada M.D.   On: 11/06/2022 16:40       Annaleah Arata M.D. [facility] 11/07/2022, 1:19 PM  Available via Epic secure chat 7am-7pm After 7 pm, please refer to night coverage provider listed on amion.

## 2022-11-07 NOTE — Evaluation (Signed)
Occupational Therapy Evaluation Patient Details Name: Brent Allison MRN: [id number] DOB: 1946-08-27 Today's Date: 11/07/2022   History of Present Illness Pt is a 76 y.o. M who presents with acute onset of abdominal pain 10/27/2022. Found to have intestinal perforation on CT scan. 12/10 surgery s/p ex lap, transverse colectomy and placement of abthera wound vac for bowel perforation, feculent peritonitis, mesenteric ischemia. 12/11 surgery: s/p ex-lap, creation of colostomy and abdominal closure with retention sutures. Extubated 11/02/2022. Significant PMH: diastolic CHF, recurrent DVT on Eliquis, CKD stage IIIa, amyloidosis, HLP, multiple myeloma, thrombocytopenia with HIT (diagnosed 10/2012), PVD, OSA.   Clinical Impression   This 76 yo male admitted with above presents to acute OT with PLOF of being totally independent with basic ADLs, IADLs, driving, and caring for his wife (dementia) and dtr (dementia and Down's)--who are now both in memory care assisted living. He currently is limited by pain and endurance with needing setup-Mod A for basic ADLs and Min-Mod A for associated mobility. He will continue to benefit from acute OT with follow up on AIR recommended so he can get to a Mod I to independent level to return home by himself.     Recommendations for follow up therapy are one component of a multi-disciplinary discharge planning process, led by the attending physician.  Recommendations may be updated based on patient status, additional functional criteria and insurance authorization.   Follow Up Recommendations  Acute inpatient rehab (3hours/day)     Assistance Recommended at Discharge Frequent or constant Supervision/Assistance (currently, feel he could get to an independent to Mod I level with AIR to go home)  Patient can return home with the following A lot of help with walking and/or transfers;A lot of help with bathing/dressing/bathroom;Assistance with cooking/housework;Assist  for transportation;Help with stairs or ramp for entrance    Functional Status Assessment  Patient has had a recent decline in their functional status and demonstrates the ability to make significant improvements in function in a reasonable and predictable amount of time.  Equipment Recommendations  Other (comment) (RW)    Recommendations for Other Services Rehab consult     Precautions / Restrictions Precautions Precautions: Fall Precaution Comments: abdominal incision, colostomy Restrictions Weight Bearing Restrictions: No      Mobility Bed Mobility Overal bed mobility: Needs Assistance Bed Mobility: Sit to Supine       Sit to supine: Min assist   General bed mobility comments: A for legs    Transfers Overall transfer level: Needs assistance Equipment used: Rolling walker (2 wheels) Transfers: Sit to/from Stand             General transfer comment: min A from higher surfaces, Mod A from lower surfaces      Balance Overall balance assessment: Needs assistance Sitting-balance support: No upper extremity supported, Feet supported Sitting balance-Leahy Scale: Good     Standing balance support: Bilateral upper extremity supported Standing balance-Leahy Scale: Poor Standing balance comment: reliant on RW                           ADL either performed or assessed with clinical judgement   ADL Overall ADL's : Needs assistance/impaired Eating/Feeding: Set up;Sitting Eating/Feeding Details (indicate cue type and reason): recliner Grooming: Set up;Sitting Grooming Details (indicate cue type and reason): recliner Upper Body Bathing: Set up;Sitting Upper Body Bathing Details (indicate cue type and reason): recliner Lower Body Bathing: Moderate assistance Lower Body Bathing Details (indicate cue type and reason):  min A from higher surfaces, Mod A from lower surfaces Upper Body Dressing : Minimal assistance;Sitting Upper Body Dressing Details (indicate  cue type and reason): recliner Lower Body Dressing: Moderate assistance Lower Body Dressing Details (indicate cue type and reason): min A from higher surfaces, Mod A from lower surfaces   Toilet Transfer Details (indicate cue type and reason): min A ambulation to toilet, Mod A sit<>stand with grab bar Toileting- Clothing Manipulation and Hygiene: Moderate assistance Toileting - Clothing Manipulation Details (indicate cue type and reason): Mod A sit<>stand with grab bar       General ADL Comments: Spoke to pt and dtr about post acute care options as far as rehab (AIR and SNF); as well as DME that he could benefit from that insurance does not pay for (tub seat and toilet handles)     Vision Baseline Vision/History: 1 Wears glasses Ability to See in Adequate Light: 0 Adequate Patient Visual Report: No change from baseline              Pertinent Vitals/Pain Pain Assessment Pain Assessment: Faces Faces Pain Scale: Hurts a little bit Pain Location: abdomen Pain Descriptors / Indicators: Operative site guarding Pain Intervention(s): Limited activity within patient's tolerance, Monitored during session, Repositioned     Hand Dominance Right   Extremity/Trunk Assessment Upper Extremity Assessment Upper Extremity Assessment: Overall WFL for tasks assessed           Communication Communication Communication: No difficulties   Cognition Arousal/Alertness: Awake/alert Behavior During Therapy: Flat affect Overall Cognitive Status: Within Functional Limits for tasks assessed                                       General Comments  VSS on 1 Liter (did take pt off of O2 to go to bathroom and VSS as well)            Home Living Family/patient expects to be discharged to:: Private residence Living Arrangements: Alone Available Help at Discharge: Family;Available PRN/intermittently (dtr lives nearby) Type of Home: House Home Access: Stairs to enter [employer] of Steps: 3 in the front, 5 in the back. Back has rail, front does not   Home Layout: Two level;Bed/bath upstairs Alternate Level Stairs-Number of Steps: flight with landing half way   Bathroom Shower/Tub: Walk-in shower   Bathroom Toilet: Handicapped height     Home Equipment: Conservation officer, nature (2 wheels);Shower seat   Additional Comments: Pt's wife and daughter (also has Down's) have dementia and have moved into ALF      Prior Functioning/Environment Prior Level of Function : Independent/Modified Independent;Driving             Mobility Comments: no AD ADLs Comments: indep, caregiver to his wife and daughter        OT Problem List: Decreased strength;Decreased activity tolerance;Impaired balance (sitting and/or standing);Cardiopulmonary status limiting activity;Pain         OT Goals(Current goals can be found in the care plan section) Acute Rehab OT Goals Patient Stated Goal: to get back home OT Goal Formulation: With patient/family Time For Goal Achievement: 11/21/22 Potential to Achieve Goals: Good  OT Frequency: Min 2X/week       AM-PAC OT "6 Clicks" Daily Activity     Outcome Measure Help from another person eating meals?: None Help from another person taking care of personal grooming?: A Little Help from another person toileting, which  includes using toliet, bedpan, or urinal?: A Lot Help from another person bathing (including washing, rinsing, drying)?: A Lot Help from another person to put on and taking off regular upper body clothing?: A Little Help from another person to put on and taking off regular lower body clothing?: A Lot 6 Click Score: 16   End of Session Equipment Utilized During Treatment: Gait belt;Rolling walker (2 wheels) Nurse Communication:  (It would be good for patient to get back in recliner for supper)  Activity Tolerance: Patient limited by fatigue Patient left: in bed;with call bell/phone within reach;with bed alarm  set;with family/visitor present  OT Visit Diagnosis: Unsteadiness on feet (R26.81);Other abnormalities of gait and mobility (R26.89);Muscle weakness (generalized) (M62.81);Pain Pain - part of body:  (abdomen)                Time: 9758-8325 OT Time Calculation (min): 49 min Charges:  OT General Charges $OT Visit: 1 Visit OT Evaluation $OT Eval Moderate Complexity: 1 Mod OT Treatments $Self Care/Home Management : 23-37 mins  [address]en [address], OTR/L [facility] Re[facility] [phone] Office [phone]    Almon Register 11/07/2022, 1:33 PM

## 2022-11-07 NOTE — Progress Notes (Signed)
ANTICOAGULATION CONSULT NOTE - follow up  Pharmacy Consult:  Argatroban  (currently on hold due to hemoptysis) Indication: DVT  Allergies  Allergen Reactions   Enoxaparin Other (See Comments)    Blood clots HIT [SRA +]   Heparin Other (See Comments)    Heparin induced thrombocytopenia (SRA +)    Patient Measurements: Height: 6' (182.9 cm) Weight: 97.2 kg (214 lb 4.6 oz) IBW/kg (Calculated) : 77.6  Vital Signs: Temp: 99.7 F (37.6 C) (12/17 1943) Temp Source: Oral (12/17 1943) BP: 124/59 (12/17 1943) Pulse Rate: 90 (12/17 1943)  Labs: Recent Labs    11/05/22 0027 11/05/22 1215 11/06/22 0357 11/06/22 0522 11/07/22 0345 11/07/22 2014  HGB 8.6*  --  7.3*  --  8.8*  --   HCT 25.9*  --  23.2*  --  26.6*  --   PLT 133*  --  123*  --  143*  --   APTT 65* 63*  --   --   --  44*  CREATININE 1.37*  --   --  1.25* 1.10  --      Estimated Creatinine Clearance: 67.9 mL/min (by C-G formula based on SCr of 1.1 mg/dL).   Medical History: Past Medical History:  Diagnosis Date   AL amyloidosis ([place]) 11/04/2012   Arthritis    lumbar DDD   Blood dyscrasia    plasma cell dyscrasia with associated amyloidosis   CKD (chronic kidney disease)    DVT (deep venous thrombosis) ([facility])    right   Dyslipidemia    H/O multiple myeloma    Heparin induced thrombocytopenia (HIT) ([place]) 11/04/2012   Joint pain    Peripheral vascular disease ([place])    DVT- 01/2012, follows by Dr. [name], lovenox maintained since Spring 2013 , pt. aware that last dose is sch. for 07/22/2012   Pneumonia    hx of with last time in Nov 2012   Prostate cancer [facility])    amyloidosis, multiple myeloma    Sleep apnea    severe with AHI 44.4 events per hour - now on CPAP at 11cm H2O   Spastic quadriparesis [facility])    Assessment: 23 YOM with history of recurerrent DVT in March 2023 on Eliquis PTA.  Patient admitted with a perforated bowel requiring reversal for surgery. Pharmacy consulted to dose argatroban while  Eliquis on hold given history of HIT. Hgb stable at 8.8 today after 1 u PRBC yesterday, plts stable at 143K today. Hemoptysis has improved with TXA nebs. Per discussion with CCM, will resume argatroban. If bleeding does not resolve, he may require IVC.   Initial aPTT is subtherapeutic at 44 seconds, no further hemoptysis so far today.  Goal of Therapy:  aPTT 50-90 seconds Monitor platelets by anticoagulation protocol: Yes   Plan:  Increase agratroban to 0.6 mcg/kg/min Recheck aPTT in 4h  Arrie Senate, PharmD, [place], [facility] Clinical Pharmacist [phone] Please check AMION for all [place] numbers 11/07/2022

## 2022-11-07 NOTE — Progress Notes (Signed)
6 Days Post-Op   Subjective/Chief Complaint: Doing well tol PO but some bloating   Objective: Vital signs in last 24 hours: Temp:  [98.1 F (36.7 C)-99.3 F (37.4 C)] 98.6 F (37 C) (12/17 0333) Pulse Rate:  [87-96] 94 (12/17 0614) Resp:  [20-30] 25 (12/17 0614) BP: (118-135)/(56-67) 134/65 (12/17 0333) SpO2:  [94 %-100 %] 99 % (12/17 0614) Weight:  [97.2 kg] 97.2 kg (12/17 0500) Last BM Date : 11/06/22  Intake/Output from previous day: 12/16 0701 - 12/17 0700 In: 2643.6 [P.O.:240; I.V.:2059.6; Blood:344] Out: -  Intake/Output this shift: No intake/output data recorded.  PE: Gen:  Alert, NAD, pleasant Card:  HR 90's while I was in the room  Pulm:  CTAB, no W/R/R, effort normal Abd: Mild distension that is improved from when I saw him earlier in the week. His abdomen is very soft. Appropriately ttp. No rigidity or guarding. Ostomy bag with liquid brown stool in bag. Stoma with output Midline wound clean with retention sutures in place  Lab Results:  Recent Labs    11/06/22 0357 11/07/22 0345  WBC 9.3 11.8*  HGB 7.3* 8.8*  HCT 23.2* 26.6*  PLT 123* 143*   BMET Recent Labs    11/06/22 0522 11/07/22 0345  NA 143 142  K 3.7 3.8  CL 112* 114*  CO2 24 23  GLUCOSE 134* 130*  BUN 23 22  CREATININE 1.25* 1.10  CALCIUM 7.9* 7.7*   PT/INR No results for input(s): "LABPROT", "INR" in the last 72 hours. ABG No results for input(s): "PHART", "HCO3" in the last 72 hours.  Invalid input(s): "PCO2", "PO2"  Studies/Results: DG Chest Port 1 View  Result Date: 11/06/2022 CLINICAL DATA:  Follow-up pneumonia EXAM: PORTABLE CHEST 1 VIEW COMPARISON:  10/31/2022 FINDINGS: The cardiomediastinal silhouette is unchanged. Enteric tube has been removed. RIGHT PICC line is unchanged with tip overlying the LOWER SVC. LEFT LOWER lung consolidation/atelectasis is unchanged. Slightly increasing RIGHT LOWER lung opacities/effusion. There is no evidence of pneumothorax. IMPRESSION: 1.  Slightly increasing RIGHT LOWER lung opacities/effusion. 2. Unchanged LEFT LOWER lung consolidation/atelectasis. Electronically Signed   By: Margarette Canada M.D.   On: 11/06/2022 16:40    Anti-infectives: Anti-infectives (From admission, onward)    Start     Dose/Rate Route Frequency Ordered Stop   11/02/22 1200  piperacillin-tazobactam (ZOSYN) IVPB 3.375 g        3.375 g 12.5 mL/hr over 240 Minutes Intravenous Every 8 hours 11/02/22 0942 11/06/22 0120   10/31/22 0400  piperacillin-tazobactam (ZOSYN) IVPB 3.375 g  Status:  Discontinued        3.375 g 12.5 mL/hr over 240 Minutes Intravenous Every 8 hours 10/23/2022 2330 11/02/22 0942   10/28/2022 2130  piperacillin-tazobactam (ZOSYN) IVPB 3.375 g        3.375 g 100 mL/hr over 30 Minutes Intravenous  Once 11/03/2022 2120 10/26/2022 2221       Assessment/Plan: Expand All Collapse All 5 Days Post-Op    Subjective/Chief Complaint: Pt doing well today Tol clears Bowel fxn into ostomy     Objective: Vital signs in last 24 hours: Temp:  [98.1 F (36.7 C)-98.5 F (36.9 C)] 98.1 F (36.7 C) (12/16 0341) Pulse Rate:  [93-98] 94 (12/16 0341) Resp:  [24-25] 25 (12/16 0341) BP: (121-133)/(53-65) 125/53 (12/16 0341) SpO2:  [90 %-98 %] 97 % (12/16 0341) Weight:  [94.9 kg] 94.9 kg (12/16 0341) Last BM Date : 11/05/22   Intake/Output from previous day: 12/15 0701 - 12/16 0700 In: 1831.9 [P.O.:460; I.V.:1163.9;  IV Piggyback:208] Out: 1350 [Urine:1150; Stool:200] Intake/Output this shift: No intake/output data recorded.   PE: Gen:  Alert, NAD, pleasant Card:  HR 90's while I was in the room  Pulm:  CTAB, no W/R/R, effort normal Abd: Mild distension that is improved from when I saw him earlier in the week. His abdomen is very soft. Appropriately ttp. No rigidity or guarding. Ostomy bag with liquid brown stool in bag. Stoma is only able to be partially visualized through ostomy pouch window 2/2 stool and is budded and viable appearing. Midline  wound clean with retention sutures in place   Lab Results:  Recent Labs (last 2 labs)      Recent Labs    11/05/22 0027 11/06/22 0357  WBC 11.7* 9.3  HGB 8.6* 7.3*  HCT 25.9* 23.2*  PLT 133* 123*      BMET Recent Labs (last 2 labs)      Recent Labs    11/05/22 0027 11/06/22 0522  NA 141 143  K 3.5 3.7  CL 111 112*  CO2 24 24  GLUCOSE 125* 134*  BUN 24* 23  CREATININE 1.37* 1.25*  CALCIUM 8.0* 7.9*      PT/INR Recent Labs (last 2 labs)  No results for input(s): "LABPROT", "INR" in the last 72 hours.   ABG  Recent Labs (last 2 labs)  No results for input(s): "PHART", "HCO3" in the last 72 hours.   Invalid input(s): "PCO2", "PO2"     Studies/Results:  Imaging Results (Last 48 hours)  [place] EKG SITE RITE   Result Date: 11/05/2022 If Site Rite image not attached, placement could not be confirmed due to current cardiac rhythm.   [place] EKG SITE RITE   Result Date: 11/04/2022 If Site Rite image not attached, placement could not be confirmed due to current cardiac rhythm.      Anti-infectives: Anti-infectives (From admission, onward)        Start     Dose/Rate Route Frequency Ordered Stop    11/02/22 1200   piperacillin-tazobactam (ZOSYN) IVPB 3.375 g        3.375 g 12.5 mL/hr over 240 Minutes Intravenous Every 8 hours 11/02/22 0942 11/06/22 0120    10/31/22 0400   piperacillin-tazobactam (ZOSYN) IVPB 3.375 g  Status:  Discontinued        3.375 g 12.5 mL/hr over 240 Minutes Intravenous Every 8 hours 11/10/2022 2330 11/02/22 0942    11/18/2022 2130   piperacillin-tazobactam (ZOSYN) IVPB 3.375 g        3.375 g 100 mL/hr over 30 Minutes Intravenous  Once 11/03/2022 2120 11/15/2022 2221             Assessment/Plan: POD 7 s/p Ex lap, transverse colectomy and placement of abthera wound vac for bowel perforation, feculent peritonitis, mesenteric ischemia by Dr. Redmond Pulling on 10/31/22 POD 6 s/p ex lap, creation of colostomy and abdominal closure including retention sutures  on 12/11 by Dr. Grandville Silos - Intra-Op Findings from 12/10: significant stool contamination, old periumbilical mesh, omentum and colonic tissue very friable and essentially fell apart, silk sutures marking proximal transverse & distal transverse colon  - Ileus  Sounds like it is improving as patient has some ROBF but still with bloating/distension and intermittent nausea, burping/belching w/ minimal po intake on cld. Will adv to fulls liq today. Cont TPN until tol PO well  - WTD BID between retention sutures - Cont abx through 12/16 - Mobilize, PT consult - Pulm toilet - On argatroban infusion, resume DOAC when taking  PO better   FEN -  cont fulls, boost breeze, TPN- await resolution of ileus VTE - SCDs, On Eliquis at baseline. Argatroban gtt (hx HIT) ID - Zosyn (stop date 12/16). Afebrile. WBC 11.7 Foley - out and voiding.    - Per CCM -  AKI on CKD3A - Cr improving  Amyloidosis  History of recurrent VTE: Most recently DVT in 01/2022 - On Eliquis at baseline. Argatroban gtt (hx HIT) NSTEMI ABL anemia - hgb stable     LOS: 8 days    Ralene Ok 11/07/2022

## 2022-11-07 NOTE — Plan of Care (Signed)

## 2022-11-07 NOTE — Progress Notes (Signed)
   NAME:  Brent Allison, MRN:  [id number], DOB:  Sep 17, 1946, LOS: 8 ADMISSION DATE:  11/18/2022, CONSULTATION DATE:  11/07/22 REFERRING MD:  Dr. Alvino Chapel ED, CHIEF COMPLAINT: Abdominal pain  History of Present Illness:  76 year old with known history of below presents with acute onset of abdominal pain about 7:30 PM, peritoneal exam, found to have viscus perforation on CT of the abdomen.  Acute onset of pain.  CT scan revealed intestinal perforation.  He takes Eliquis for VTE history.  Last dose this morning.  Received Andexxa in the ED for reversal.  Surgery was consulted.  Taken to the OR.  They recommended medical ICU admission versus surgical admission given complex medical history.  Lab notable for mild elevation in creatinine, AKI, mild leukocytosis with left shift, hemoglobin appears at baseline.  Blood cultures were obtained.  Patient was transferred out of ICU on 12/13  PCCM was reconsulted on 12/5 because of blood-tinged secretions  Pertinent  Medical History  History of amyloidosis, history of diastolic dysfunction although euvolemic on exam, history of DVT  Significant Hospital Events: Including procedures, antibiotic start and stop dates in addition to other pertinent events   11/02/2022 presents with acute onset abdominal pain found to have abdominal perforation 10/31/22  - Surgery : POD 0 s/p Ex lap, transverse colectomy and placement of abthera wound vac for bowel perforation, feculent peritonitis, mesenteric ischemia by Dr. Redmond Pulling on 10/31/22. - Intra-Op Findings: significant stool contamination, old periumbilical mesh, omentum and colonic tissue very friable and essentially fell apart, silk sutures marking proximal transverse & distal transverse colon.  On Eliquis at baseline. Andexxa pre-op. T  11/11/2022 - surgery: POD 0 s/p ex-lap, creation of colostomy and abdominal closure with retention sutures by Dr. Grandville Silos; holding [facility]. Tolerating PS 5 over 5, extubate.  Interim  History / Subjective:  Hemoptysis markedly improved.  Objective   Blood pressure 132/62, pulse 89, temperature 98.1 F (36.7 C), temperature source Oral, resp. rate (!) 25, height 6' (1.829 m), weight 97.2 kg, SpO2 97 %.        Intake/Output Summary (Last 24 hours) at 11/07/2022 1310 Last data filed at 11/07/2022 0750 Gross per 24 hour  Intake 2403.63 ml  Output 250 ml  Net 2153.63 ml    Filed Weights   11/05/22 0506 11/06/22 0341 11/07/22 0500  Weight: 94.5 kg 94.9 kg 97.2 kg    No distress in bed Lungs minimal rhonci Dried blood on tongue gone Ext warm Moves ext to command, globally weak   Resolved Hospital Problem list   Lactic acidosis - present on admission - resolved 12/12  Assessment & Plan:  Acute peritonitis Mesenteric ischemia with bowel perforation status post ex lap with partial colectomy and ostomy Acute hypoxic respiratory failure Recurrent DVTs AKI on CKD stage IIIa due to septic ATN Acute on chronic blood loss anemia, perioperative Prediabetes Demand cardiac ischemia Thrombocytopenia in setting of sepsis Hemoptysis  - TXA nebs x 48h - f/u  sputum culture - Consider diuresis - Rechallenge with argatroban - If resumption of hemoptysis with argatroban, IVC filter consult  Erskine Emery MD [facility] See Amion for pager If no response to pager, please call [phone] until 7pm After 7pm, Please call E-link [phone]

## 2022-11-08 ENCOUNTER — Inpatient Hospital Stay (HOSPITAL_COMMUNITY): Payer: Medicare Other | Admitting: Registered Nurse

## 2022-11-08 ENCOUNTER — Encounter (HOSPITAL_COMMUNITY): Admission: EM | Disposition: E | Payer: Self-pay | Source: Home / Self Care | Attending: Pulmonary Disease

## 2022-11-08 ENCOUNTER — Inpatient Hospital Stay (HOSPITAL_COMMUNITY): Payer: Medicare Other

## 2022-11-08 DIAGNOSIS — K559 Vascular disorder of intestine, unspecified: Secondary | ICD-10-CM | POA: Diagnosis not present

## 2022-11-08 DIAGNOSIS — K668 Other specified disorders of peritoneum: Secondary | ICD-10-CM | POA: Diagnosis not present

## 2022-11-08 DIAGNOSIS — Z87891 Personal history of nicotine dependence: Secondary | ICD-10-CM | POA: Diagnosis not present

## 2022-11-08 DIAGNOSIS — I517 Cardiomegaly: Secondary | ICD-10-CM | POA: Diagnosis not present

## 2022-11-08 DIAGNOSIS — A419 Sepsis, unspecified organism: Secondary | ICD-10-CM | POA: Diagnosis not present

## 2022-11-08 DIAGNOSIS — J9601 Acute respiratory failure with hypoxia: Secondary | ICD-10-CM | POA: Insufficient documentation

## 2022-11-08 DIAGNOSIS — R198 Other specified symptoms and signs involving the digestive system and abdomen: Secondary | ICD-10-CM | POA: Diagnosis not present

## 2022-11-08 DIAGNOSIS — I340 Nonrheumatic mitral (valve) insufficiency: Secondary | ICD-10-CM

## 2022-11-08 DIAGNOSIS — R6521 Severe sepsis with septic shock: Secondary | ICD-10-CM | POA: Diagnosis not present

## 2022-11-08 HISTORY — PX: PARTIAL COLECTOMY: SHX5273

## 2022-11-08 HISTORY — PX: LAPAROTOMY: SHX154

## 2022-11-08 HISTORY — PX: APPLICATION OF WOUND VAC: SHX5189

## 2022-11-08 LAB — GLOBAL TEG PANEL
CFF Max Amplitude: 25.9 mm (ref 15–32)
CK with Heparinase (R): 14 min — ABNORMAL HIGH (ref 4.3–8.3)
Citrated Functional Fibrinogen: 472.6 mg/dL (ref 278–581)
Citrated Kaolin (K): 1.1 min (ref 0.8–2.1)
Citrated Kaolin (MA): 68.4 mm (ref 52–69)
Citrated Kaolin (R): 13.4 min — ABNORMAL HIGH (ref 4.6–9.1)
Citrated Kaolin Angle: 74.8 deg (ref 63–78)
Citrated Rapid TEG (MA): 69 mm (ref 52–70)

## 2022-11-08 LAB — CBC WITH DIFFERENTIAL/PLATELET
Abs Immature Granulocytes: 0.44 10*3/uL — ABNORMAL HIGH (ref 0.00–0.07)
Abs Immature Granulocytes: 0 10*3/uL (ref 0.00–0.07)
Basophils Absolute: 0.1 10*3/uL (ref 0.0–0.1)
Basophils Absolute: 0 10*3/uL (ref 0.0–0.1)
Basophils Relative: 0 %
Basophils Relative: 0 %
Eosinophils Absolute: 0 10*3/uL (ref 0.0–0.5)
Eosinophils Absolute: 0 10*3/uL (ref 0.0–0.5)
Eosinophils Relative: 0 %
Eosinophils Relative: 0 %
HCT: 32.1 % — ABNORMAL LOW (ref 39.0–52.0)
HCT: 34.3 % — ABNORMAL LOW (ref 39.0–52.0)
Hemoglobin: 10.1 g/dL — ABNORMAL LOW (ref 13.0–17.0)
Hemoglobin: 11.7 g/dL — ABNORMAL LOW (ref 13.0–17.0)
Immature Granulocytes: 1 %
Lymphocytes Relative: 3 %
Lymphocytes Relative: 7 %
Lymphs Abs: 0.8 10*3/uL (ref 0.7–4.0)
Lymphs Abs: 1.2 10*3/uL (ref 0.7–4.0)
MCH: 32 pg (ref 26.0–34.0)
MCH: 30.9 pg (ref 26.0–34.0)
MCHC: 31.5 g/dL (ref 30.0–36.0)
MCHC: 34.1 g/dL (ref 30.0–36.0)
MCV: 101.6 fL — ABNORMAL HIGH (ref 80.0–100.0)
MCV: 90.5 fL (ref 80.0–100.0)
Monocytes Absolute: 1.3 10*3/uL — ABNORMAL HIGH (ref 0.1–1.0)
Monocytes Absolute: 0.2 10*3/uL (ref 0.1–1.0)
Monocytes Relative: 4 %
Monocytes Relative: 1 %
Neutro Abs: 30.1 10*3/uL — ABNORMAL HIGH (ref 1.7–7.7)
Neutro Abs: 16.4 10*3/uL — ABNORMAL HIGH (ref 1.7–7.7)
Neutrophils Relative %: 92 %
Neutrophils Relative %: 92 %
Platelets: 313 10*3/uL (ref 150–400)
Platelets: 170 10*3/uL (ref 150–400)
RBC: 3.16 MIL/uL — ABNORMAL LOW (ref 4.22–5.81)
RBC: 3.79 MIL/uL — ABNORMAL LOW (ref 4.22–5.81)
RDW: 14.7 % (ref 11.5–15.5)
RDW: 18.6 % — ABNORMAL HIGH (ref 11.5–15.5)
WBC: 32.8 10*3/uL — ABNORMAL HIGH (ref 4.0–10.5)
WBC: 17.8 10*3/uL — ABNORMAL HIGH (ref 4.0–10.5)
nRBC: 0.1 % (ref 0.0–0.2)
nRBC: 0.5 % — ABNORMAL HIGH (ref 0.0–0.2)
nRBC: 1 /100 WBC — ABNORMAL HIGH

## 2022-11-08 LAB — POCT I-STAT 7, (LYTES, BLD GAS, ICA,H+H)
Acid-base deficit: 15 mmol/L — ABNORMAL HIGH (ref 0.0–2.0)
Acid-base deficit: 12 mmol/L — ABNORMAL HIGH (ref 0.0–2.0)
Acid-base deficit: 10 mmol/L — ABNORMAL HIGH (ref 0.0–2.0)
Acid-base deficit: 7 mmol/L — ABNORMAL HIGH (ref 0.0–2.0)
Bicarbonate: 14.5 mmol/L — ABNORMAL LOW (ref 20.0–28.0)
Bicarbonate: 16.8 mmol/L — ABNORMAL LOW (ref 20.0–28.0)
Bicarbonate: 19.7 mmol/L — ABNORMAL LOW (ref 20.0–28.0)
Bicarbonate: 18.5 mmol/L — ABNORMAL LOW (ref 20.0–28.0)
Calcium, Ion: 1.69 mmol/L (ref 1.15–1.40)
Calcium, Ion: 1.28 mmol/L (ref 1.15–1.40)
Calcium, Ion: 1.26 mmol/L (ref 1.15–1.40)
Calcium, Ion: 1.17 mmol/L (ref 1.15–1.40)
HCT: 21 % — ABNORMAL LOW (ref 39.0–52.0)
HCT: 21 % — ABNORMAL LOW (ref 39.0–52.0)
HCT: 26 % — ABNORMAL LOW (ref 39.0–52.0)
HCT: 29 % — ABNORMAL LOW (ref 39.0–52.0)
Hemoglobin: 7.1 g/dL — ABNORMAL LOW (ref 13.0–17.0)
Hemoglobin: 7.1 g/dL — ABNORMAL LOW (ref 13.0–17.0)
Hemoglobin: 8.8 g/dL — ABNORMAL LOW (ref 13.0–17.0)
Hemoglobin: 9.9 g/dL — ABNORMAL LOW (ref 13.0–17.0)
O2 Saturation: 84 %
O2 Saturation: 91 %
O2 Saturation: 97 %
O2 Saturation: 100 %
Patient temperature: 97.8
Potassium: 3.8 mmol/L (ref 3.5–5.1)
Potassium: 3.8 mmol/L (ref 3.5–5.1)
Potassium: 4.4 mmol/L (ref 3.5–5.1)
Potassium: 4.3 mmol/L (ref 3.5–5.1)
Sodium: 146 mmol/L — ABNORMAL HIGH (ref 135–145)
Sodium: 147 mmol/L — ABNORMAL HIGH (ref 135–145)
Sodium: 145 mmol/L (ref 135–145)
Sodium: 145 mmol/L (ref 135–145)
TCO2: 16 mmol/L — ABNORMAL LOW (ref 22–32)
TCO2: 18 mmol/L — ABNORMAL LOW (ref 22–32)
TCO2: 22 mmol/L (ref 22–32)
TCO2: 20 mmol/L — ABNORMAL LOW (ref 22–32)
pCO2 arterial: 51.3 mmHg — ABNORMAL HIGH (ref 32–48)
pCO2 arterial: 52.1 mmHg — ABNORMAL HIGH (ref 32–48)
pCO2 arterial: 63.4 mmHg — ABNORMAL HIGH (ref 32–48)
pCO2 arterial: 35.5 mmHg (ref 32–48)
pH, Arterial: 7.059 — CL (ref 7.35–7.45)
pH, Arterial: 7.116 — CL (ref 7.35–7.45)
pH, Arterial: 7.1 — CL (ref 7.35–7.45)
pH, Arterial: 7.324 — ABNORMAL LOW (ref 7.35–7.45)
pO2, Arterial: 69 mmHg — ABNORMAL LOW (ref 83–108)
pO2, Arterial: 82 mmHg — ABNORMAL LOW (ref 83–108)
pO2, Arterial: 131 mmHg — ABNORMAL HIGH (ref 83–108)
pO2, Arterial: 191 mmHg — ABNORMAL HIGH (ref 83–108)

## 2022-11-08 LAB — LACTIC ACID, PLASMA
Lactic Acid, Venous: 2.8 mmol/L (ref 0.5–1.9)
Lactic Acid, Venous: 3.5 mmol/L (ref 0.5–1.9)
Lactic Acid, Venous: 5.6 mmol/L (ref 0.5–1.9)
Lactic Acid, Venous: 6.2 mmol/L (ref 0.5–1.9)
Lactic Acid, Venous: 4.8 mmol/L (ref 0.5–1.9)

## 2022-11-08 LAB — COMPREHENSIVE METABOLIC PANEL
ALT: 102 U/L — ABNORMAL HIGH (ref 0–44)
ALT: 46 U/L — ABNORMAL HIGH (ref 0–44)
ALT: 50 U/L — ABNORMAL HIGH (ref 0–44)
AST: 91 U/L — ABNORMAL HIGH (ref 15–41)
AST: 44 U/L — ABNORMAL HIGH (ref 15–41)
AST: 45 U/L — ABNORMAL HIGH (ref 15–41)
Albumin: 1.7 g/dL — ABNORMAL LOW (ref 3.5–5.0)
Albumin: 1.7 g/dL — ABNORMAL LOW (ref 3.5–5.0)
Albumin: 1.8 g/dL — ABNORMAL LOW (ref 3.5–5.0)
Alkaline Phosphatase: 144 U/L — ABNORMAL HIGH (ref 38–126)
Alkaline Phosphatase: 64 U/L (ref 38–126)
Alkaline Phosphatase: 64 U/L (ref 38–126)
Anion gap: 5 (ref 5–15)
Anion gap: 9 (ref 5–15)
Anion gap: 10 (ref 5–15)
BUN: 26 mg/dL — ABNORMAL HIGH (ref 8–23)
BUN: 28 mg/dL — ABNORMAL HIGH (ref 8–23)
BUN: 28 mg/dL — ABNORMAL HIGH (ref 8–23)
CO2: 21 mmol/L — ABNORMAL LOW (ref 22–32)
CO2: 20 mmol/L — ABNORMAL LOW (ref 22–32)
CO2: 18 mmol/L — ABNORMAL LOW (ref 22–32)
Calcium: 7.9 mg/dL — ABNORMAL LOW (ref 8.9–10.3)
Calcium: 7.5 mg/dL — ABNORMAL LOW (ref 8.9–10.3)
Calcium: 7.6 mg/dL — ABNORMAL LOW (ref 8.9–10.3)
Chloride: 115 mmol/L — ABNORMAL HIGH (ref 98–111)
Chloride: 114 mmol/L — ABNORMAL HIGH (ref 98–111)
Chloride: 115 mmol/L — ABNORMAL HIGH (ref 98–111)
Creatinine, Ser: 1.27 mg/dL — ABNORMAL HIGH (ref 0.61–1.24)
Creatinine, Ser: 1.5 mg/dL — ABNORMAL HIGH (ref 0.61–1.24)
Creatinine, Ser: 1.47 mg/dL — ABNORMAL HIGH (ref 0.61–1.24)
GFR, Estimated: 58 mL/min — ABNORMAL LOW (ref >60)
GFR, Estimated: 48 mL/min — ABNORMAL LOW (ref >60)
GFR, Estimated: 49 mL/min — ABNORMAL LOW (ref >60)
Glucose, Bld: 186 mg/dL — ABNORMAL HIGH (ref 70–99)
Glucose, Bld: 240 mg/dL — ABNORMAL HIGH (ref 70–99)
Glucose, Bld: 254 mg/dL — ABNORMAL HIGH (ref 70–99)
Potassium: 4.1 mmol/L (ref 3.5–5.1)
Potassium: 4.2 mmol/L (ref 3.5–5.1)
Potassium: 4.1 mmol/L (ref 3.5–5.1)
Sodium: 141 mmol/L (ref 135–145)
Sodium: 143 mmol/L (ref 135–145)
Sodium: 143 mmol/L (ref 135–145)
Total Bilirubin: 1.2 mg/dL (ref 0.3–1.2)
Total Bilirubin: 2 mg/dL — ABNORMAL HIGH (ref 0.3–1.2)
Total Bilirubin: 2.3 mg/dL — ABNORMAL HIGH (ref 0.3–1.2)
Total Protein: 3.8 g/dL — ABNORMAL LOW (ref 6.5–8.1)
Total Protein: 3.2 g/dL — ABNORMAL LOW (ref 6.5–8.1)
Total Protein: 3.4 g/dL — ABNORMAL LOW (ref 6.5–8.1)

## 2022-11-08 LAB — PROTIME-INR
INR: 2.5 — ABNORMAL HIGH (ref 0.8–1.2)
Prothrombin Time: 26.7 seconds — ABNORMAL HIGH (ref 11.4–15.2)

## 2022-11-08 LAB — CBC
HCT: 29.9 % — ABNORMAL LOW (ref 39.0–52.0)
Hemoglobin: 10.4 g/dL — ABNORMAL LOW (ref 13.0–17.0)
MCH: 30.9 pg (ref 26.0–34.0)
MCHC: 34.8 g/dL (ref 30.0–36.0)
MCV: 88.7 fL (ref 80.0–100.0)
Platelets: 145 10*3/uL — ABNORMAL LOW (ref 150–400)
RBC: 3.37 MIL/uL — ABNORMAL LOW (ref 4.22–5.81)
RDW: 19.6 % — ABNORMAL HIGH (ref 11.5–15.5)
WBC: 21 10*3/uL — ABNORMAL HIGH (ref 4.0–10.5)
nRBC: 0.6 % — ABNORMAL HIGH (ref 0.0–0.2)

## 2022-11-08 LAB — BLOOD GAS, ARTERIAL
Acid-base deficit: 5.3 mmol/L — ABNORMAL HIGH (ref 0.0–2.0)
Bicarbonate: 19.1 mmol/L — ABNORMAL LOW (ref 20.0–28.0)
Drawn by: 164
O2 Saturation: 89.7 %
Patient temperature: 36.8
pCO2 arterial: 33 mmHg (ref 32–48)
pH, Arterial: 7.37 (ref 7.35–7.45)
pO2, Arterial: 59 mmHg — ABNORMAL LOW (ref 83–108)

## 2022-11-08 LAB — GLUCOSE, CAPILLARY
Glucose-Capillary: 165 mg/dL — ABNORMAL HIGH (ref 70–99)
Glucose-Capillary: 174 mg/dL — ABNORMAL HIGH (ref 70–99)
Glucose-Capillary: 232 mg/dL — ABNORMAL HIGH (ref 70–99)
Glucose-Capillary: 221 mg/dL — ABNORMAL HIGH (ref 70–99)

## 2022-11-08 LAB — TRIGLYCERIDES: Triglycerides: 76 mg/dL (ref <150)

## 2022-11-08 LAB — HEMOGLOBIN AND HEMATOCRIT, BLOOD
HCT: 30.3 % — ABNORMAL LOW (ref 39.0–52.0)
Hemoglobin: 10.2 g/dL — ABNORMAL LOW (ref 13.0–17.0)

## 2022-11-08 LAB — PHOSPHORUS: Phosphorus: 4.9 mg/dL — ABNORMAL HIGH (ref 2.5–4.6)

## 2022-11-08 LAB — APTT
aPTT: 53 seconds — ABNORMAL HIGH (ref 24–36)
aPTT: 49 seconds — ABNORMAL HIGH (ref 24–36)

## 2022-11-08 LAB — PREPARE RBC (CROSSMATCH)

## 2022-11-08 LAB — MAGNESIUM: Magnesium: 1.9 mg/dL (ref 1.7–2.4)

## 2022-11-08 SURGERY — LAPAROTOMY, EXPLORATORY
Anesthesia: General | Site: Abdomen

## 2022-11-08 MED ORDER — 0.9 % SODIUM CHLORIDE (POUR BTL) OPTIME
TOPICAL | Status: DC | PRN
  Administered 2022-11-08: 1100 mL

## 2022-11-08 MED ORDER — DEXMEDETOMIDINE HCL IN NACL 400 MCG/100ML IV SOLN
0.0000 ug/kg/h | INTRAVENOUS | Status: DC
  Administered 2022-11-08 (×2): 0.6 ug/kg/h via INTRAVENOUS
  Administered 2022-11-09: 0.7 ug/kg/h via INTRAVENOUS
  Administered 2022-11-09: 0.8 ug/kg/h via INTRAVENOUS
  Administered 2022-11-09 (×2): 0.7 ug/kg/h via INTRAVENOUS
  Administered 2022-11-09: 0.8 ug/kg/h via INTRAVENOUS
  Administered 2022-11-10: 0.7 ug/kg/h via INTRAVENOUS
  Administered 2022-11-10: 0.8 ug/kg/h via INTRAVENOUS
  Administered 2022-11-10 (×2): 0.7 ug/kg/h via INTRAVENOUS
  Administered 2022-11-11: 1 ug/kg/h via INTRAVENOUS
  Administered 2022-11-11: 0.8 ug/kg/h via INTRAVENOUS
  Administered 2022-11-11 (×2): 1 ug/kg/h via INTRAVENOUS
  Administered 2022-11-11 (×2): 0.8 ug/kg/h via INTRAVENOUS
  Administered 2022-11-12: 1.2 ug/kg/h via INTRAVENOUS
  Administered 2022-11-12 (×2): 0.6 ug/kg/h via INTRAVENOUS
  Administered 2022-11-12: 1 ug/kg/h via INTRAVENOUS
  Filled 2022-11-08: qty 200
  Filled 2022-11-08 (×21): qty 100

## 2022-11-08 MED ORDER — FENTANYL 2500MCG IN NS 250ML (10MCG/ML) PREMIX INFUSION
0.0000 ug/h | INTRAVENOUS | Status: DC
  Administered 2022-11-08: 50 ug/h via INTRAVENOUS
  Administered 2022-11-09: 100 ug/h via INTRAVENOUS
  Administered 2022-11-10 (×2): 150 ug/h via INTRAVENOUS
  Administered 2022-11-11: 200 ug/h via INTRAVENOUS
  Administered 2022-11-11: 150 ug/h via INTRAVENOUS
  Administered 2022-11-12: 200 ug/h via INTRAVENOUS
  Administered 2022-11-12: 225 ug/h via INTRAVENOUS
  Administered 2022-11-13: 100 ug/h via INTRAVENOUS
  Filled 2022-11-08 (×9): qty 250

## 2022-11-08 MED ORDER — DEXMEDETOMIDINE HCL IN NACL 400 MCG/100ML IV SOLN
INTRAVENOUS | Status: DC | PRN
  Administered 2022-11-08: .5 ug/kg/h via INTRAVENOUS

## 2022-11-08 MED ORDER — VASOPRESSIN 20 UNITS/100 ML INFUSION FOR SHOCK
0.0000 [IU]/min | INTRAVENOUS | Status: DC
  Administered 2022-11-08 – 2022-11-10 (×6): 0.03 [IU]/min via INTRAVENOUS
  Filled 2022-11-08 (×5): qty 100

## 2022-11-08 MED ORDER — ROCURONIUM BROMIDE 10 MG/ML (PF) SYRINGE
PREFILLED_SYRINGE | INTRAVENOUS | Status: AC
  Administered 2022-11-08: 100 mg
  Filled 2022-11-08: qty 10

## 2022-11-08 MED ORDER — ORAL CARE MOUTH RINSE
15.0000 mL | OROMUCOSAL | Status: DC
  Administered 2022-11-08 – 2022-11-13 (×59): 15 mL via OROMUCOSAL

## 2022-11-08 MED ORDER — IOHEXOL 350 MG/ML SOLN
75.0000 mL | Freq: Once | INTRAVENOUS | Status: AC | PRN
  Administered 2022-11-08: 75 mL via INTRAVENOUS

## 2022-11-08 MED ORDER — NOREPINEPHRINE 4 MG/250ML-% IV SOLN
INTRAVENOUS | Status: AC
  Filled 2022-11-08: qty 250

## 2022-11-08 MED ORDER — FENTANYL BOLUS VIA INFUSION
25.0000 ug | INTRAVENOUS | Status: DC | PRN
  Administered 2022-11-09 – 2022-11-11 (×4): 50 ug via INTRAVENOUS
  Administered 2022-11-11: 100 ug via INTRAVENOUS
  Administered 2022-11-11: 50 ug via INTRAVENOUS
  Administered 2022-11-12: 25 ug via INTRAVENOUS
  Administered 2022-11-12: 100 ug via INTRAVENOUS
  Administered 2022-11-12: 25 ug via INTRAVENOUS

## 2022-11-08 MED ORDER — ROCURONIUM BROMIDE 10 MG/ML (PF) SYRINGE
PREFILLED_SYRINGE | INTRAVENOUS | Status: DC | PRN
  Administered 2022-11-08: 20 mg via INTRAVENOUS
  Administered 2022-11-08: 80 mg via INTRAVENOUS

## 2022-11-08 MED ORDER — SODIUM CHLORIDE 0.9% IV SOLUTION: Freq: Once | INTRAVENOUS | Status: AC

## 2022-11-08 MED ORDER — NOREPINEPHRINE 4 MG/250ML-% IV SOLN
INTRAVENOUS | Status: DC | PRN
  Administered 2022-11-08: 2 ug/min via INTRAVENOUS

## 2022-11-08 MED ORDER — SODIUM BICARBONATE 8.4 % IV SOLN
100.0000 meq | Freq: Once | INTRAVENOUS | Status: AC
  Administered 2022-11-08: 100 meq via INTRAVENOUS
  Filled 2022-11-08: qty 100

## 2022-11-08 MED ORDER — VASOPRESSIN 20 UNIT/ML IV SOLN
INTRAVENOUS | Status: AC
  Filled 2022-11-08: qty 1

## 2022-11-08 MED ORDER — NOREPINEPHRINE 16 MG/250ML-% IV SOLN
0.0000 ug/min | INTRAVENOUS | Status: DC
  Administered 2022-11-08: 20 ug/min via INTRAVENOUS
  Administered 2022-11-08 – 2022-11-09 (×4): 40 ug/min via INTRAVENOUS
  Administered 2022-11-09: 20 ug/min via INTRAVENOUS
  Administered 2022-11-10: 6 ug/min via INTRAVENOUS
  Filled 2022-11-08 (×8): qty 250

## 2022-11-08 MED ORDER — EPINEPHRINE HCL 5 MG/250ML IV SOLN IN NS
0.5000 ug/min | INTRAVENOUS | Status: AC
  Administered 2022-11-08: 5 ug/min via INTRAVENOUS
  Filled 2022-11-08: qty 250

## 2022-11-08 MED ORDER — EPINEPHRINE 1 MG/10ML IJ SOSY
PREFILLED_SYRINGE | INTRAMUSCULAR | Status: DC | PRN
  Administered 2022-11-08: 1 mg via INTRAVENOUS

## 2022-11-08 MED ORDER — FAMOTIDINE 20 MG PO TABS: 20.0000 mg | ORAL_TABLET | Freq: Two times a day (BID) | ORAL | Status: DC

## 2022-11-08 MED ORDER — PIPERACILLIN-TAZOBACTAM 3.375 G IVPB
3.3750 g | Freq: Three times a day (TID) | INTRAVENOUS | Status: DC
  Administered 2022-11-08 (×2): 3.375 g via INTRAVENOUS
  Filled 2022-11-08 (×4): qty 50

## 2022-11-08 MED ORDER — CALCIUM CHLORIDE 10 % IV SOLN
INTRAVENOUS | Status: DC | PRN
  Administered 2022-11-08: 300 mg via INTRAVENOUS
  Administered 2022-11-08: 200 mg via INTRAVENOUS
  Administered 2022-11-08: 500 mg via INTRAVENOUS

## 2022-11-08 MED ORDER — ORAL CARE MOUTH RINSE: 15.0000 mL | OROMUCOSAL | Status: DC | PRN

## 2022-11-08 MED ORDER — PROPOFOL 10 MG/ML IV BOLUS
INTRAVENOUS | Status: AC
  Filled 2022-11-08: qty 20

## 2022-11-08 MED ORDER — CHLORHEXIDINE GLUCONATE 4 % EX LIQD: 1.0000 | Freq: Once | CUTANEOUS | Status: DC

## 2022-11-08 MED ORDER — TRACE MINERALS CU-MN-SE-ZN 300-55-60-3000 MCG/ML IV SOLN
INTRAVENOUS | Status: AC
  Filled 2022-11-08: qty 819.2

## 2022-11-08 MED ORDER — ACETAMINOPHEN 650 MG RE SUPP: 650.0000 mg | Freq: Four times a day (QID) | RECTAL | Status: DC | PRN

## 2022-11-08 MED ORDER — LACTATED RINGERS IV BOLUS: 1000.0000 mL | Freq: Once | INTRAVENOUS | Status: DC

## 2022-11-08 MED ORDER — ALBUMIN HUMAN 5 % IV SOLN: INTRAVENOUS | Status: DC | PRN

## 2022-11-08 MED ORDER — FENTANYL CITRATE (PF) 250 MCG/5ML IJ SOLN
INTRAMUSCULAR | Status: AC
  Filled 2022-11-08: qty 5

## 2022-11-08 MED ORDER — EPINEPHRINE HCL 5 MG/250ML IV SOLN IN NS
0.5000 ug/min | INTRAVENOUS | Status: DC
  Administered 2022-11-08: 15 ug/min via INTRAVENOUS
  Administered 2022-11-08: 3 ug/min via INTRAVENOUS
  Administered 2022-11-08: 15 ug/min via INTRAVENOUS
  Administered 2022-11-09: 17 ug/min via INTRAVENOUS
  Administered 2022-11-09: 5 ug/min via INTRAVENOUS
  Administered 2022-11-09: 19 ug/min via INTRAVENOUS
  Filled 2022-11-08 (×5): qty 250

## 2022-11-08 MED ORDER — LACTATED RINGERS IV SOLN: INTRAVENOUS | Status: DC | PRN

## 2022-11-08 MED ORDER — SODIUM CHLORIDE 0.9 % IV SOLN
100.0000 mg | INTRAVENOUS | Status: DC
  Administered 2022-11-08 – 2022-11-13 (×5): 100 mg via INTRAVENOUS
  Filled 2022-11-08 (×8): qty 5

## 2022-11-08 MED ORDER — POLYETHYLENE GLYCOL 3350 17 G PO PACK
17.0000 g | PACK | Freq: Every day | ORAL | Status: DC
  Filled 2022-11-08: qty 1

## 2022-11-08 MED ORDER — SODIUM CHLORIDE 0.9 % IV SOLN
1.0000 g | Freq: Three times a day (TID) | INTRAVENOUS | Status: DC
  Administered 2022-11-08 – 2022-11-09 (×3): 1 g via INTRAVENOUS
  Filled 2022-11-08 (×3): qty 20

## 2022-11-08 MED ORDER — VASOPRESSIN 20 UNIT/ML IV SOLN
INTRAVENOUS | Status: DC | PRN
  Administered 2022-11-08: 2 [IU] via INTRAVENOUS
  Administered 2022-11-08 (×2): 3 [IU] via INTRAVENOUS
  Administered 2022-11-08: 1 [IU] via INTRAVENOUS
  Administered 2022-11-08 (×2): 2 [IU] via INTRAVENOUS
  Administered 2022-11-08: 1 [IU] via INTRAVENOUS
  Administered 2022-11-08: 2 [IU] via INTRAVENOUS

## 2022-11-08 MED ORDER — SODIUM CHLORIDE 0.9 % IV SOLN: 10.0000 mL/h | Freq: Once | INTRAVENOUS | Status: AC

## 2022-11-08 MED ORDER — MIDAZOLAM HCL 2 MG/2ML IJ SOLN
INTRAMUSCULAR | Status: DC | PRN
  Administered 2022-11-08: 2 mg via INTRAVENOUS

## 2022-11-08 MED ORDER — FENTANYL CITRATE PF 50 MCG/ML IJ SOSY
25.0000 ug | PREFILLED_SYRINGE | Freq: Once | INTRAMUSCULAR | Status: AC
  Administered 2022-11-08: 25 ug via INTRAVENOUS

## 2022-11-08 MED ORDER — SODIUM BICARBONATE 8.4 % IV SOLN
INTRAVENOUS | Status: DC | PRN
  Administered 2022-11-08 (×4): 50 meq via INTRAVENOUS

## 2022-11-08 MED ORDER — PHENYLEPHRINE 80 MCG/ML (10ML) SYRINGE FOR IV PUSH (FOR BLOOD PRESSURE SUPPORT)
PREFILLED_SYRINGE | INTRAVENOUS | Status: DC | PRN
  Administered 2022-11-08: 240 ug via INTRAVENOUS
  Administered 2022-11-08 (×2): 160 ug via INTRAVENOUS

## 2022-11-08 MED ORDER — VASOPRESSIN 20 UNITS/100 ML INFUSION FOR SHOCK
0.0000 [IU]/min | INTRAVENOUS | Status: AC
  Administered 2022-11-08: .04 [IU]/min via INTRAVENOUS
  Filled 2022-11-08: qty 100

## 2022-11-08 MED ORDER — LACTATED RINGERS IV BOLUS
1000.0000 mL | Freq: Once | INTRAVENOUS | Status: AC
  Administered 2022-11-08: 1000 mL via INTRAVENOUS

## 2022-11-08 MED ORDER — DOCUSATE SODIUM 50 MG/5ML PO LIQD
100.0000 mg | Freq: Two times a day (BID) | ORAL | Status: DC
  Administered 2022-11-09 – 2022-11-13 (×6): 100 mg
  Filled 2022-11-08 (×7): qty 10

## 2022-11-08 MED ORDER — ETOMIDATE 2 MG/ML IV SOLN
INTRAVENOUS | Status: AC
  Administered 2022-11-08: 20 mg
  Filled 2022-11-08: qty 20

## 2022-11-08 MED ORDER — MIDAZOLAM HCL 2 MG/2ML IJ SOLN
INTRAMUSCULAR | Status: AC
  Filled 2022-11-08: qty 2

## 2022-11-08 MED ORDER — SODIUM CHLORIDE 0.9 % IV BOLUS
1000.0000 mL | Freq: Once | INTRAVENOUS | Status: AC
  Administered 2022-11-08: 1000 mL via INTRAVENOUS

## 2022-11-08 MED ORDER — INSULIN ASPART 100 UNIT/ML IJ SOLN
0.0000 [IU] | INTRAMUSCULAR | Status: DC
  Administered 2022-11-08 (×2): 2 [IU] via SUBCUTANEOUS
  Administered 2022-11-08 – 2022-11-09 (×2): 3 [IU] via SUBCUTANEOUS
  Administered 2022-11-09 (×2): 2 [IU] via SUBCUTANEOUS
  Administered 2022-11-09: 3 [IU] via SUBCUTANEOUS
  Administered 2022-11-09: 2 [IU] via SUBCUTANEOUS
  Administered 2022-11-09: 3 [IU] via SUBCUTANEOUS
  Administered 2022-11-09: 2 [IU] via SUBCUTANEOUS
  Administered 2022-11-10: 3 [IU] via SUBCUTANEOUS

## 2022-11-08 SURGICAL SUPPLY — 57 items
ADH SKN CLS APL DERMABOND .7 (GAUZE/BANDAGES/DRESSINGS) ×4
APL PRP STRL LF DISP 70% ISPRP (MISCELLANEOUS)
APL SKNCLS STERI-STRIP NONHPOA (GAUZE/BANDAGES/DRESSINGS) ×4
BAG COUNTER SPONGE SURGICOUNT (BAG) ×2 IMPLANT
BAG SPNG CNTER NS LX DISP (BAG) ×2
BENZOIN TINCTURE PRP APPL 2/3 (GAUZE/BANDAGES/DRESSINGS) IMPLANT
BLADE CLIPPER SURG (BLADE) IMPLANT
CANISTER SUCT 3000ML PPV (MISCELLANEOUS) ×2 IMPLANT
CANISTER WOUNDNEG PRESSURE 500 (CANNISTER) IMPLANT
CHLORAPREP W/TINT 26 (MISCELLANEOUS) ×2 IMPLANT
COVER SURGICAL LIGHT HANDLE (MISCELLANEOUS) ×2 IMPLANT
DERMABOND ADVANCED .7 DNX12 (GAUZE/BANDAGES/DRESSINGS) ×4 IMPLANT
DRAPE INCISE IOBAN 66X45 STRL (DRAPES) ×2 IMPLANT
DRAPE LAPAROSCOPIC ABDOMINAL (DRAPES) ×2 IMPLANT
DRAPE WARM FLUID 44X44 (DRAPES) ×2 IMPLANT
DRSG OPSITE POSTOP 4X10 (GAUZE/BANDAGES/DRESSINGS) IMPLANT
DRSG OPSITE POSTOP 4X8 (GAUZE/BANDAGES/DRESSINGS) IMPLANT
ELECT BLADE 6.5 EXT (BLADE) IMPLANT
ELECT CAUTERY BLADE 6.4 (BLADE) ×2 IMPLANT
ELECT REM PT RETURN 9FT ADLT (ELECTROSURGICAL) ×2
ELECTRODE REM PT RTRN 9FT ADLT (ELECTROSURGICAL) ×2 IMPLANT
GLOVE BIOGEL PI IND STRL 6 (GLOVE) ×2 IMPLANT
GLOVE BIOGEL PI MICRO STRL 5.5 (GLOVE) ×2 IMPLANT
GLOVE INDICATOR 7.0 STRL GRN (GLOVE) IMPLANT
GLOVE SURG SS PI 7.0 STRL IVOR (GLOVE) IMPLANT
GOWN STRL REUS W/ TWL LRG LVL3 (GOWN DISPOSABLE) ×4 IMPLANT
GOWN STRL REUS W/TWL LRG LVL3 (GOWN DISPOSABLE) ×4
HANDLE SUCTION POOLE (INSTRUMENTS) ×2 IMPLANT
KIT BASIN OR (CUSTOM PROCEDURE TRAY) ×2 IMPLANT
KIT TURNOVER KIT B (KITS) ×2 IMPLANT
LIGASURE IMPACT 36 18CM CVD LR (INSTRUMENTS) IMPLANT
NS IRRIG 1000ML POUR BTL (IV SOLUTION) ×4 IMPLANT
PACK GENERAL/GYN (CUSTOM PROCEDURE TRAY) ×2 IMPLANT
PAD ARMBOARD 7.5X6 YLW CONV (MISCELLANEOUS) ×2 IMPLANT
PENCIL SMOKE EVACUATOR (MISCELLANEOUS) ×2 IMPLANT
RELOAD PROXIMATE 75MM BLUE (ENDOMECHANICALS) ×6 IMPLANT
RELOAD STAPLE 75 3.8 BLU REG (ENDOMECHANICALS) IMPLANT
SLEEVE SUCTION CATH 165 (SLEEVE) ×2 IMPLANT
SPECIMEN JAR LARGE (MISCELLANEOUS) IMPLANT
SPONGE ABDOMINAL VAC ABTHERA (MISCELLANEOUS) IMPLANT
SPONGE T-LAP 18X18 ~~LOC~~+RFID (SPONGE) IMPLANT
STAPLER PROXIMATE 75MM BLUE (STAPLE) IMPLANT
STAPLER VISISTAT 35W (STAPLE) IMPLANT
SUCTION POOLE HANDLE (INSTRUMENTS) ×2
SUT MNCRL AB 4-0 PS2 18 (SUTURE) ×4 IMPLANT
SUT PDS AB 1 TP1 96 (SUTURE) ×4 IMPLANT
SUT SILK 2 0 SH CR/8 (SUTURE) ×2 IMPLANT
SUT SILK 2 0 TIES 10X30 (SUTURE) ×2 IMPLANT
SUT SILK 3 0 SH CR/8 (SUTURE) ×2 IMPLANT
SUT SILK 3 0 TIES 10X30 (SUTURE) ×2 IMPLANT
SUT VIC AB 3-0 SH 18 (SUTURE) IMPLANT
SUT VIC AB 3-0 SH 27 (SUTURE)
SUT VIC AB 3-0 SH 27XBRD (SUTURE) ×4 IMPLANT
TOWEL GREEN STERILE (TOWEL DISPOSABLE) ×2 IMPLANT
TRAY FOLEY MTR SLVR 14FR STAT (SET/KITS/TRAYS/PACK) IMPLANT
TRAY FOLEY MTR SLVR 16FR STAT (SET/KITS/TRAYS/PACK) IMPLANT
YANKAUER SUCT BULB TIP NO VENT (SUCTIONS) IMPLANT

## 2022-11-08 NOTE — Progress Notes (Signed)
I was notified this evening that since the start of shift, the patient has had a significant increase in abdominal pain and distension. He has also developed tachypnea and increasing oxygen requirement. Labs are significant for a mild AKI and significant increase in WBC to 33 (from 12). On exam the patient appears acutely ill, with tachypnea and mild tachycardia. Abdomen is very distended and tender to palpation. Colostomy is edematous and productive of stool. CT scan shows a collection of gas in the RUQ communicating with the colon just proximal to the colostomy, with free fluid around the liver, consistent with a new colonic perforation. Given patient's clinical decline and peritonitis on exam, I recommended re-exploration and colon resection. He is now POD7 from his abdominal closure, we should still be able to safely re-enter the abdomen. I reviewed the planned procedure with the patient and his daughter Leafy Ro at bedside. I discussed that the abdomen may need to be left open again depending on the condition of the remainder of the colon. I also discussed the possibility of him remaining intubated following the procedure given his current respiratory distress, as well as the possibility that he may continue to clinically deteriorate even after colon resection and washout. His daughter expressed understanding and consents to proceed with surgery. Discussed with the primary physician covering the patient, Dr. Claria Dice.  Michaelle Birks, [facility], Hepatobiliary and Pancreatic Surgery 10/24/2022 3:48 AM

## 2022-11-08 NOTE — Progress Notes (Signed)
Subjective Intubated/sedated; no urgent concerns reported by nursing  Objective: Vital signs in last 24 hours: Temp:  [97.7 F (36.5 C)-99.7 F (37.6 C)] 98.6 F (37 C) (12/18 0750) Pulse Rate:  [90-113] 103 (12/18 0750) Resp:  [16-38] 16 (12/18 0750) BP: (70-124)/(44-84) 99/49 (12/18 0750) SpO2:  [87 %-93 %] 92 % (12/18 0710) Arterial Line BP: (93-114)/(39-52) 102/47 (12/18 0714) FiO2 (%):  [90 %-100 %] 90 % (12/18 0840) Weight:  [98.3 kg] 98.3 kg (12/18 0300) Last BM Date : 11/06/22  Intake/Output from previous day: 12/17 0701 - 12/18 0700 In: 2742.3 [I.V.:1677.3; Blood:315; IV Piggyback:750] Out: 1500 [Urine:550; Emesis/NG output:850; Blood:100] Intake/Output this shift: Total I/O In: 681.5 [Blood:681.5] Out: 500 [Drains:500]  Gen: NAD, comfortable CV: RRR Pulm: Normal work of breathing Abd: Soft, VAC in place with serosanguinous effluent Ext: SCDs in place  Lab Results: CBC  Recent Labs    11/07/22 0345 10/31/2022 0020 10/27/2022 0525 10/22/2022 0604 11/06/2022 0837  WBC 11.8* 32.8*  --   --   --   HGB 8.8* 10.1*   < > 7.1* 8.8*  HCT 26.6* 32.1*   < > 21.0* 26.0*  PLT 143* 313  --   --   --    < > = values in this interval not displayed.   BMET Recent Labs    11/07/22 0345 10/29/2022 0020 11/07/2022 0525 11/17/2022 0604 11/18/2022 0837  NA 142 141   < > 147* 145  K 3.8 4.1   < > 3.8 4.4  CL 114* 115*  --   --   --   CO2 23 21*  --   --   --   GLUCOSE 130* 186*  --   --   --   BUN 22 26*  --   --   --   CREATININE 1.10 1.27*  --   --   --   CALCIUM 7.7* 7.9*  --   --   --    < > = values in this interval not displayed.   PT/INR No results for input(s): "LABPROT", "INR" in the last 72 hours. ABG Recent Labs    11/20/2022 0604 11/11/2022 0837  PHART 7.116* 7.100*  HCO3 16.8* 19.7*    Studies/Results:  Anti-infectives: Anti-infectives (From admission, onward)    Start     Dose/Rate Route Frequency Ordered Stop   11/18/2022 0900  micafungin (MYCAMINE) 100  mg in sodium chloride 0.9 % 100 mL IVPB        100 mg 105 mL/hr over 1 Hours Intravenous Every 24 hours 11/02/2022 0803     11/03/2022 0200  piperacillin-tazobactam (ZOSYN) IVPB 3.375 g        3.375 g 12.5 mL/hr over 240 Minutes Intravenous Every 8 hours 11/15/2022 0119     11/02/22 1200  piperacillin-tazobactam (ZOSYN) IVPB 3.375 g        3.375 g 12.5 mL/hr over 240 Minutes Intravenous Every 8 hours 11/02/22 0942 11/06/22 0120   10/31/22 0400  piperacillin-tazobactam (ZOSYN) IVPB 3.375 g  Status:  Discontinued        3.375 g 12.5 mL/hr over 240 Minutes Intravenous Every 8 hours 10/26/2022 2330 11/02/22 0942   11/20/2022 2130  piperacillin-tazobactam (ZOSYN) IVPB 3.375 g        3.375 g 100 mL/hr over 30 Minutes Intravenous  Once 11/19/2022 2120 10/28/2022 2221        Assessment/Plan: Patient Active Problem List   Diagnosis Date Noted   Malnutrition of moderate degree 11/11/2022  Septic shock ([place]) 10/24/2022   Perforated abdominal viscus 11/20/2022   Pneumoperitoneum 10/25/2022   Goals of care, counseling/discussion 01/12/2022   Cervical spondylosis with myelopathy and radiculopathy 12/21/2021   Upper respiratory infection 11/26/2021   Hemoptysis 03/17/2020   Thoracic stenosis 06/02/2015   OSA (obstructive sleep apnea) 12/05/2014   Portal vein thrombosis 11/14/2012   Pulmonary embolism ([place]) 11/13/2012   AL amyloidosis ([place]) 11/04/2012   Heparin induced thrombocytopenia (HIT) ([place]) 11/04/2012   Mesenteric venous thrombosis ([place]) 10/31/2012   DVT (deep venous thrombosis) ([place]) 02/02/2012   Multiple myeloma ([place]) 10/01/2011   Multiple myeloma ([place]) 10/01/2011   Neuropathy 10/01/2011   Dyslipidemia    POD 8 s/p Ex lap, transverse colectomy and placement of abthera wound vac for bowel perforation, feculent peritonitis, mesenteric ischemia by Dr. Redmond Pulling on 10/31/22 POD 7 s/p ex lap, creation of colostomy and abdominal closure including retention sutures on 12/11 by Dr. Grandville Silos POD 0 s/p  ex lap, partial colectomy (proximal transverse) with discontinuity, application of Abthera VAC; noted arrest in preop.   FINDINGS 12/18: Large perforation in the transverse colon just proximal to the colostomy site, with feculent peritonitis.    - Ileus Cont TPN until tol PO well  - WTD BID between retention sutures - IV abx, broad spec - Previously on argatroban infusion, with resume DOAC when taking PO better - now hold off on argatroban if possible today given his takeback; if hgb stable may resume tomorrow   FEN - TPN; NG tube with strict NPO given he is in discontinuity  VTE - SCDs, On Eliquis at baseline. Argatroban gtt prior (hx HIT) ID - Zosyn (stop date 12/16) - restart broad spec IV abx   - Per CCM -  AKI on CKD3A - Cr improving  Amyloidosis  History of recurrent VTE: Most recently DVT in 01/2022 - On Eliquis at baseline. Argatroban gtt (hx HIT) NSTEMI ABL anemia - hgb stable    LOS: 9 days   Nadeen Landau, MD [facility], [place]

## 2022-11-08 NOTE — Progress Notes (Signed)
Pt c/o abdominal pain, similar to what he had prior to surgery.  He was given PO oxycodone and IV Dialudid - no relief found, MD was notified.  Labs ordered and KUB, with 1 mg dilaudid.  Pt still c/o pain, MD notified, and came to bedside.  Additional '1mg'$  dilaudid given along with CT abd. Ordered.  Pt still had pain.  His respirations increased and o2 demand as well.  By was confused and trying to get OOB, MD notified. And labs ordered around 0000 - drawn and sent.  Pt went for CT of abd around 1:30 - returned, and  his pain was better.  However, he was becoming more lethargic.  MD then ordered NG tube, and was place and put to LI suction, and received 850 out in the first hour. Surgery saw Pt, and Pt was taken to Short Stay for emergency surgery.  Daughter has been at bed side since around 2245.

## 2022-11-08 NOTE — Progress Notes (Signed)
[facility] Progress Note Patient Name: Brent Allison DOB: [birth year] MRN: [id number]   Date of Service  11/07/2022  HPI/Events of Note  Last Lactic Acid level at about 11 AM = 6.2. Patient is currently on Norepinephrine, Epinephrine and Vasopressin IV infusions for hemodynamic support.   eICU Interventions  Plan: Lactic Acid level now.      Intervention Category Major Interventions: Acid-Base disturbance - evaluation and management  Cordarrel Stiefel [place] 11/15/2022, 8:59 PM

## 2022-11-08 NOTE — Anesthesia Preprocedure Evaluation (Signed)
Anesthesia Evaluation   Patient unresponsive    Reviewed: Unable to perform ROS - Chart review onlyPreop documentation limited or incomplete due to emergent nature of procedure.  Airway        Dental   Pulmonary former smoker          Cardiovascular   1. Left ventricular ejection fraction, by estimation, is 50 to 55%. The  left ventricle has low normal function. The left ventricle has no regional  wall motion abnormalities. There is moderate asymmetric left ventricular  hypertrophy of the basal-septal  segment. Left ventricular diastolic parameters are indeterminate.   2. Right ventricular systolic function is normal. The right ventricular  size is normal. Tricuspid regurgitation signal is inadequate for assessing  PA pressure.   3. The mitral valve is normal in structure. Trivial mitral valve  regurgitation. No evidence of mitral stenosis.   4. The aortic valve was not well visualized. Aortic valve regurgitation  is not visualized. No aortic stenosis is present.     Neuro/Psych    GI/Hepatic   Endo/Other    Renal/GU Lab Results      Component                Value               Date                      CREATININE               1.27 (H)            11/06/2022                Musculoskeletal   Abdominal   Peds  Hematology Lab Results      Component                Value               Date                      WBC                      32.8 (H)            11/18/2022                HGB                      10.1 (L)            10/24/2022                HCT                      32.1 (L)            10/22/2022                MCV                      101.6 (H)           10/29/2022                PLT                      313  11/16/2022              Anesthesia Other Findings   Reproductive/Obstetrics                             Anesthesia Physical Anesthesia Plan  ASA: 5 and  emergent  Anesthesia Plan: General   Post-op Pain Management:    Induction:   PONV Risk Score and Plan: 2  Airway Management Planned: Oral ETT  Additional Equipment: Arterial line  Intra-op Plan:   Post-operative Plan: Post-operative intubation/ventilation  Informed Consent:      History available from chart only and Only emergency history available  Plan Discussed with: Surgeon  Anesthesia Plan Comments: (Arrived to patient in slot 36, asystole, CPR with bag mask ventilation underway. [phone]), '1mg'$  epi at 0436, ETT with glidescope [place], ROSC 0439. Transferred to OR for emergent surgery after possible respiratory arrest. )        Anesthesia Quick Evaluation

## 2022-11-08 NOTE — Progress Notes (Signed)
Critical ABG results given to Dr. Tacy Learn. Verbal order received to decrease FiO2 to 90%, increase peep to 8cmH2O and increase RR to 28. RN aware of changes. RT will continue to monitor and be available as needed.

## 2022-11-08 NOTE — Transfer of Care (Signed)
Immediate Anesthesia Transfer of Care Note  Patient: Brent Allison  Procedure(s) Performed: Benedetto Goad WASHOUT (Abdomen) PARTIAL COLECTOMY (Abdomen) APPLICATION OF ABTHERA  ADVANCE (Abdomen)  Patient Location: ICU  Anesthesia Type:General  Level of Consciousness: sedated and Patient remains intubated per anesthesia plan  Airway & Oxygen Therapy: Patient remains intubated per anesthesia plan and Patient placed on Ventilator (see vital sign flow sheet for setting)  Post-op Assessment: Report given to RN and Post -op Vital signs reviewed and stable  Post vital signs: Reviewed and stable  Last Vitals:  Vitals Value Taken Time  BP    Temp 36.6 C 10/31/2022 0655  Pulse 113 11/20/2022 0655  Resp 16 11/09/2022 0721  SpO2 92 % 11/02/2022 0710  Vitals shown include unvalidated device data.  Last Pain:  Vitals:   11/12/2022 0655  TempSrc: Oral  PainSc:       Patients Stated Pain Goal: 2 (70/01/74 9449)  Complications: No notable events documented.

## 2022-11-08 NOTE — Progress Notes (Signed)
Date and time results received: 11/07/2022 0138    (use smartphrase ".now" to insert current time)  Test: Lactic Acid Critical Value: 2.8  Name of Provider Notified: Dr. Claria Dice   Orders Received? Or Actions Taken?:   orders placed

## 2022-11-08 NOTE — Progress Notes (Signed)
Inpatient Rehab Admissions Coordinator:   Note change in medical status.  Will follow for extubation and re-assessment by therapies.    Shann Medal, PT, DPT Admissions Coordinator [phone] 11/03/2022  9:55 AM

## 2022-11-08 NOTE — Progress Notes (Signed)
Nutrition Follow-up  DOCUMENTATION CODES:  Non-severe (moderate) malnutrition in context of chronic illness  INTERVENTION:  Continue TPN Management per pharmacy. Currently, TPN rate is 80 mL/hr and provides 123g of protein and 2293 kcals per day.  NUTRITION DIAGNOSIS:  Moderate Malnutrition related to chronic illness (amyloidosis, CHF, CKD IIIa) as evidenced by mild fat depletion, moderate muscle depletion. - remains applicable  GOAL:  Patient will meet greater than or equal to 90% of their needs - progressing, being met with TPN  MONITOR:  PO intake, Supplement acceptance, Diet advancement, Labs, I & O's, Weight trends, Other (Comment) (TPN)  REASON FOR ASSESSMENT:  Consult New TPN/TNA  ASSESSMENT:  76 year old male who presented to the ED on 12/09 with abdominal pain. 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 Pt admitted with severe sepsis with abdominal perforation, feculent peritonitis, mesenteric ischemia, AKI.  12/9 - Op, ex lap, transverse colectomy, placement of abthera wound VAC, ICU on vent  12/11 - s/p ex lap with abdominal closure  12/12 - extubation, clear liquid diet 12/14 - TPN initiated  12/18 - Op, ex lap with partial colectomy/placement of wound vac   Patient is currently intubated on ventilator support. Back from OR this AM after repeat exploratory laparotomy with further resection.  Abthera VAC placed.   TPN infusing and meeting nutrition needs.   MV: 8.8 L/min Temp (24hrs), Avg:98.4 F (36.9 C), Min:97.7 F (36.5 C), Max:99.7 F (37.6 C)   Intake/Output Summary (Last 24 hours) at 11/12/2022 0847 Last data filed at 11/05/2022 0750 Gross per 24 hour  Intake 3423.81 ml  Output 1750 ml  Net 1673.81 ml  Net IO Since Admission: 12,036.63 mL [10/29/2022 0847]  Nutritionally Relevant Medications: Scheduled Meds:  docusate  100 mg Per Tube BID   insulin aspart  0-9 Units  Subcutaneous Q6H   pantoprazole IV  40 mg Intravenous Q24H   polyethylene glycol  17 g Per Tube Daily   Continuous Infusions:  epinephrine 3 mcg/min (11/02/2022 0840)   micafungin 100 mg in sodium chloride 0.9 % 100 mL IVPB     norepinephrine (LEVOPHED) Adult infusion 20 mcg/min (11/11/2022 0808)   piperacillin-tazobactam (ZOSYN)  IV 3.375 g (10/29/2022 0235)   TPN ADULT (ION) 40 mL/hr at 10/29/2022 0344   vasopressin 0.03 Units/min (11/15/2022 0841)   PRN Meds: ondansetron, prochlorperazine, simethicone  Labs Reviewed: Chloride 115 BUN 26, creatinine 1.27 CBG ranges from 131-165 mg/dL over the last 24 hours  NUTRITION - FOCUSED PHYSICAL EXAM: Flowsheet Row Most Recent Value  Orbital Region Moderate depletion  Upper Arm Region Mild depletion  Thoracic and Lumbar Region Mild depletion  Buccal Region Unable to assess  Temple Region No depletion  Clavicle Bone Region Moderate depletion  Clavicle and Acromion Bone Region Moderate depletion  Scapular Bone Region Moderate depletion  Dorsal Hand No depletion  Patellar Region No depletion  Anterior Thigh Region Mild depletion  Posterior Calf Region Mild depletion  Edema (RD Assessment) None  Hair Reviewed  Eyes Reviewed  Mouth Reviewed  Skin Reviewed  Nails Reviewed   Diet Order:   Diet Order             Diet NPO time specified  Diet effective now                   EDUCATION NEEDS:  Education needs have been addressed  Skin:  Skin Assessment: Skin Integrity Issues: Skin Integrity Issues:: Incisions Incisions:  midline, new ostomy  Last BM:  no documented BM; no documented output from colostomy  Height:  Ht Readings from Last 1 Encounters:  11/03/22 6' (1.829 m)    Weight:  Wt Readings from Last 1 Encounters:  10/25/2022 98.3 kg    Ideal Body Weight:  80.9 kg  BMI:  Body mass index is 29.39 kg/m.  Estimated Nutritional Needs:  Kcal:  2200-2400 Protein:  120-140 grams Fluid:  >2.0 L   Ranell Patrick, RD,  LDN Clinical Dietitian RD pager # available in [place]  After hours/weekend pager # available in [facility]

## 2022-11-08 NOTE — Progress Notes (Signed)
Pt just returned from CT Scan

## 2022-11-08 NOTE — Procedures (Addendum)
Intubation Procedure Note  Brent Allison  [id number]  [birth year]  Date:11/03/2022  Time:9:27 AM   Provider Performing:Amaad Byers Lisabeth Devoid    Procedure: Intubation (93810)  Indication(s) Respiratory Failure  Consent Unable to obtain consent due to emergent nature of procedure. Unable to obtain consent due to emergent nature of procedure.  Anesthesia Etomidate and Rocuronium   Time Out Verified patient identification, verified procedure, site/side was marked, verified correct patient position, special equipment/implants available, medications/allergies/relevant history reviewed, required imaging and test results available.   Sterile Technique Usual hand hygeine, masks, and gloves were used   Procedure Description Patient positioned in bed supine.  Sedation given as noted above.  Patient was intubated with endotracheal tube using Glidescope.  View was Grade 1 full glottis .  Number of attempts was 1.    Complications/Tolerance None; patient tolerated the procedure well. Chest X-ray is ordered to verify placement.   EBL Minimal   Specimen(s) None

## 2022-11-08 NOTE — Progress Notes (Addendum)
Called by nursing, patient with severe abdominal pain.  States in pain similar to what he had when he came in.  KUB ordered, normal.  Physical examination distended abdomen, no bowel sounds.  Stat CT abdomen pelvis ordered with oral contrast plus labs.  WBC is 32.8 (up from 11.6 yesterday).  Lactic acid 2.8(up from 1.6).  Creatinine 1.3(up from 1.1 yesterday).  ABG normal.  Patient with decreased urine output.  Now confused, tachycardic, tachypneic, acutely ill.  Blood cultures x 2 ordered, Zosyn started. CT scan shows a collection of gas in the RUQ communicating with the colon just proximal to the colostomy, with free fluid around the liver, consistent with a new colonic perforation,, concern for abscess.  Surgeon on-call Dr. Wilburn Cornelia aware.  [facility] am aware.  Patient's daughter at bedside aware.  Increasing LA. Sepsis bolus ordered, total 1.5 L IV fluids given, additional 1 L LR bolus ordered.  D5 NS @ 100 cc/hr

## 2022-11-08 NOTE — Progress Notes (Addendum)
I was informed by respiratory therapist that patient is losing expiratory volume, ET tube was adjusted without much improvement in expiratory volumes  Decision was to proceed with removal of Faulty ET tube and exchange with a new 1. Please see separate procedure note   Addendum: 12:46 PM  Patient INR came back 2.5, will transfuse 2 units of FFP, 1 unit of platelet and 1 pool of cryo as patient is having large output from wound VAC     Jacky Kindle, MD [facility] See Amion for pager If no response to pager, please call [phone] until 7pm After 7pm, Please call E-link [phone]

## 2022-11-08 NOTE — Progress Notes (Signed)
[facility] Progress Note Patient Name: Brent Allison DOB: Nov 14, 1946 MRN: [id number]   Date of Service  11/17/2022  HPI/Events of Note  Fever - Nursing request for Tylenol. AST and ALT are both elevated. Will avoid Tylenol.   eICU Interventions  Plan: D/C Tylenol.  Ice packs PRN.      Intervention Category Major Interventions: Other:  Lysle Dingwall 11/09/2022, 10:40 PM

## 2022-11-08 NOTE — Progress Notes (Signed)
Dr. Tacy Learn made aware of low exhaled Vt on ventilator along with audible cuff leak despite adding air to cuff and advancing ETT. Decision made to replace ETT. #8 ETT removed and replaced with a new #8 ETT 26cm at the lip. Chest xray pending. RT will continue to monitor and be available as needed.

## 2022-11-08 NOTE — Anesthesia Procedure Notes (Signed)
Procedure Name: Intubation Date/Time: 10/27/2022 4:36 AM  Performed by: Trinna Post., CRNAPre-anesthesia Checklist: Patient identified, Emergency Drugs available, Suction available and Patient being monitored Oxygen Delivery Method: Ambu bag Preoxygenation: Pre-oxygenation with 100% oxygen Induction Type: Rapid sequence and Cricoid Pressure applied Ventilation: Mask ventilation without difficulty Laryngoscope Size: Glidescope and 4 Grade View: Grade I Tube type: Oral Tube size: 7.5 mm Number of attempts: 1 Airway Equipment and Method: Stylet Placement Confirmation: ETT inserted through vocal cords under direct vision, positive ETCO2 and breath sounds checked- equal and bilateral Secured at: 21 cm Tube secured with: Tape Dental Injury: Teeth and Oropharynx as per pre-operative assessment  Comments: Intubation during CPR, secretions in back of oropharynx suctioned. Grade 3b view with MAC 4 blade. Switched to Glide 4 and grade 1 view

## 2022-11-08 NOTE — Anesthesia Procedure Notes (Signed)
Arterial Line Insertion Start/End12/19/2023 4:55 AM, 11/09/2022 5:00 AM Performed by: Oleta Mouse, MD  Patient location: Pre-op. Emergency situation Left, radial was placed Catheter size: 20 G Hand hygiene performed , maximum sterile barriers used  and Seldinger technique used Allen's test indicative of satisfactory collateral circulation Attempts: 1 Procedure performed without using ultrasound guided technique. Following insertion, dressing applied and Biopatch. Post procedure assessment: normal and unchanged  Patient tolerated the procedure well with no immediate complications.

## 2022-11-08 NOTE — Progress Notes (Signed)
PHARMACY - TOTAL PARENTERAL NUTRITION CONSULT NOTE   Indication: Prolonged ileus  Patient Measurements: Height: 6' (182.9 cm) Weight: 98.3 kg (216 lb 11.4 oz) IBW/kg (Calculated) : 77.6 TPN AdjBW (KG): 96.9 Body mass index is 29.39 kg/m.  Assessment: 76 yo male admitted on 12/9 for abdominal pain found to have peritonitis due to transverse colon perforation possibly secondary to mesenteric ischemia. S/p ex lap, creating of colostomy and abdominal closure on 12/11. Patient was extubated on 12/12 and started on a clear liquid diet, however has had minimal PO intake since admission and unable to tolerate nutritional supplements due to severe nausea. Pharmacy has been consulted to dose TPN.   Glucose / Insulin: hx pre-DM, A1c 5.7%. CBGs <180 (one 240). SSI use: 3u/24 hours. No DM meds PTA. Electrolytes: Na 143 (on D5NS), K 4.2, Cl 114, CoCa 9.3, Mg 1.9, Phos 4.9 Renal: Scr down 1.5, BUN 28 (bsl Scr ~1.5-1.7) Hepatic: LFTs slightly elevated; Alb 1.7, Tbili 2.0, TG 111 Intake / Output: up ~10L this admit; UO 0.2 ml/kg/hr; NG 850 ML; wound vac output increased; +11L this admit    GI Imaging: 12/9 CTAP: suspected pneumatosis, concern for mesenteric ischemia GI Surgeries / Procedures:  12/10: s/p Ex lap, transverse colectomy and placement of abthera wound vac for bowel perforation, feculent peritonitis, mesenteric ischemia 12/11: s/p ex lap, creation of colostomy and abdominal closure including retention sutures 12/18: s/p ex lap, re-perforation - partial colectomy, temporary closure   Central access: ordered to be placed 12/14 TPN start date: 12/14  Nutritional Goals: Goal TPN rate is 80 mL/hr (provides 123 g of protein and 2293 kcals per day meeting 100% of needs)  RD Assessment: Estimated Needs Total Energy Estimated Needs: 2200-2400 Total Protein Estimated Needs: 120-140 grams Total Fluid Estimated Needs: >2.0 L  Current Nutrition:  12/14 TPN>>  12/15 CLD - Boost Breeze TID as  tolerated (intermittently refusing d/t nausea) 12/16 advancing to full liquids - Boost x2 12/16 12/17 NPO   Plan:  Advance concentrated TPN to 56m/hr at 1800 (will provide 122 g AA and 2293 kCal meeting 100% of patient needs) Electrolytes in TPN: Na 8 mEq/L, K 28 mEq/L, Ca 3 mEq/L, Mg 6 mEq/L, and Phos 5 mmol/L. Max Acetate Add standard trace elements and MVI to TPN Adjust Sensitive q4h SSI and adjust as needed  Monitor TPN labs on Mon/Thurs  Thank you for allowing pharmacy to be a part of this patient's care.  MWilson Singer PharmD Clinical Pharmacist 11/06/2022 8:10 AM

## 2022-11-08 NOTE — Op Note (Signed)
Date: 11/09/2022  Patient: Brent Allison MRN: [id number]  Preoperative Diagnosis: Colon perforation Postoperative Diagnosis: Same  Procedure:  Exploratory laparotomy Partial colectomy (proximal transverse colon) Temporary abdominal closure with negative pressure dressing Darlyne Russian)  Surgeon: Michaelle Birks, MD Assistant: Stark Klein, MD  EBL: Minimal  Anesthesia: General endotracheal  Specimens: Transverse colon and omentum  Indications: Brent Allison is a 76 year old male who presented a week ago with acute abdominal pain and was found to have pneumoperitoneum.  He was taken to the operating room and found to have a perforation of the transverse colon which was resected.  His abdomen was left open and he was brought back the following day for colostomy creation and closure.  He was progressing well postoperatively until yesterday evening, when he began to have an increase in abdominal pain.  He had a progressive clinical deterioration overnight with worsening abdominal distention, respiratory distress, and increased leukocytosis.  A CT scan showed gas and fluid in the right upper quadrant, with a likely perforation of the transverse colon just proximal to the colostomy.  The patient was brought emergently to the operating room for exploration.  Findings: Large perforation in the transverse colon just proximal to the colostomy site, with feculent peritonitis.  Procedure details: Informed consent was obtained in the preoperative area prior to the procedure from the patient's daughter.  The patient had a cardiac arrest in the preoperative area, and was intubated at that time.  ROSC was obtained after 2 rounds of ACLS protocol.  The patient was then brought to the operating room and placed on the table in the supine position.  General anesthesia was induced and appropriate lines and drains were placed for intraoperative monitoring. Perioperative antibiotics were administered per SCIP  guidelines. The abdomen was prepped and draped in the usual sterile fashion. A pre-procedure timeout was taken verifying patient identity, surgical site and procedure to be performed.  The previous retention sutures were removed and the skin was opened.  The fascial sutures were cut and the fascia was opened.  On entry into the abdomen copious feculent fluid was encountered.  The omentum was mildly adherent to the anterior abdominal wall and was gently dissected off using blunt dissection.  The feculent fluid appeared to be emanating from the right upper quadrant.  The right upper quadrant abdominal wall was elevated and further feculent fluid was suctioned out.  It was difficult to visualize the source of the leak due to the presence of the colostomy, thus the stoma was taken down.  The stoma appeared dusky.  The sutures anchoring the stoma to the skin were cut, and the colostomy was bluntly separated from the subcutaneous tissue and fascia and brought through the abdominal wall back into the abdomen.  The cecum was then identified and partially mobilized along the line of Toldt.  The colostomy was again identified and followed proximally toward the hepatic flexure.  A large perforation was identified about 15 cm proximal to the colostomy.  A mesenteric window was created proximal to the perforation, at which point the colon appeared well-perfused.  The colon was divided at this point, which was just distal to the hepatic flexure, using a 75 mm GIA stapler with a blue load.  The mesentery of the resected colon was then divided with LigaSure.  The resected transverse colon and colostomy were passed off the field and sent for routine pathology.  A large portion of omentum was also resected as it was very friable and appeared ischemic.  The abdomen was then copiously irrigated with warm saline. The ascending colon was examined and appeared mildly dusky but still viable, thus it was not resected. An Abthera negative  pressure dressing was placed, with a bridge to the previous colostomy site.  All counts were correct x2 at the end of the procedure. The patient remained intubated and was transported to the ICU in critical condition following the procedure.  Michaelle Birks, MD 11/19/2022 6:04 AM

## 2022-11-08 NOTE — Progress Notes (Signed)
NAME:  Brent Allison, MRN:  [id number], DOB:  1946/05/05, LOS: 9 ADMISSION DATE:  10/24/2022, CONSULTATION DATE:  10/27/2022 REFERRING MD:  Dr. Alvino Chapel ED, CHIEF COMPLAINT: Abdominal pain  History of Present Illness:  76 year old with known history of below presents with acute onset of abdominal pain about 7:30 PM, peritoneal exam, found to have viscus perforation on CT of the abdomen.  Acute onset of pain.  CT scan revealed intestinal perforation.  He takes Eliquis for VTE history.  Last dose this morning.  Received Andexxa in the ED for reversal.  Surgery was consulted.  Taken to the OR.  They recommended medical ICU admission versus surgical admission given complex medical history.  Lab notable for mild elevation in creatinine, AKI, mild leukocytosis with left shift, hemoglobin appears at baseline.  Blood cultures were obtained.  Pertinent  Medical History  History of amyloidosis, history of diastolic dysfunction although euvolemic on exam, history of DVT  Significant Hospital Events: Including procedures, antibiotic start and stop dates in addition to other pertinent events   11/20/2022 presents with acute onset abdominal pain found to have abdominal perforation 10/31/22  - Surgery : POD 0 s/p Ex lap, transverse colectomy and placement of abthera wound vac for bowel perforation, feculent peritonitis, mesenteric ischemia by Dr. Redmond Pulling on 10/31/22. - Intra-Op Findings: significant stool contamination, old periumbilical mesh, omentum and colonic tissue very friable and essentially fell apart, silk sutures marking proximal transverse & distal transverse colon.  On Eliquis at baseline. Andexxa pre-op. T  11/21/2022 - surgery: POD 0 s/p ex-lap, creation of colostomy and abdominal closure with retention sutures by Dr. Grandville Silos; holding [facility]. Tolerating PS 5 over 5, extubate.  Interim History / Subjective:   Overnight patient started with increasing abdominal pain and distention, he became  hypoxic and hypotensive His white count trended up from 12-33, CT scan was done which showed colonic perforation, patient was taken to the OR where he had PEA cardiac arrest, ROSC was achieved Patient had ex lap, partial colectomy and temporary abdominal closure with wound VAC, postoperative patient remained intubated on multiple vasopressors and was transferred to ICU  Objective   Blood pressure (!) 99/49, pulse (!) 103, temperature 98.6 F (37 C), temperature source Oral, resp. rate 16, height 6' (1.829 m), weight 98.3 kg, SpO2 92 %.    Vent Mode: PRVC FiO2 (%):  [90 %-100 %] 90 % Set Rate:  [16 bmp-28 bmp] 28 bmp Vt Set:  [550 mL-580 mL] 580 mL PEEP:  [5 cmH20-8 cmH20] 8 cmH20   Intake/Output Summary (Last 24 hours) at 10/24/2022 0908 Last data filed at 11/09/2022 0750 Gross per 24 hour  Intake 3423.81 ml  Output 1750 ml  Net 1673.81 ml   Filed Weights   11/06/22 0341 11/07/22 0500 10/31/2022 0300  Weight: 94.9 kg 97.2 kg 98.3 kg    Examination:   Physical exam: General: Crtitically ill-appearing male, orally intubated HEENT: [place]/AT, eyes anicteric.  ETT in place Neuro: Sedated, not following commands.  Eyes are closed.  Pupils 3 mm bilateral reactive to light Chest: Coarse breath sounds, no wheezes or rhonchi Heart: Regular rate and rhythm, no murmurs or gallops Abdomen: Soft, distended, wound VAC in place, absent sounds present Skin: No rash   Resolved Hospital Problem list   Lactic acidosis - present on admission - resolved 12/12  Assessment & Plan:  Severe sepsis with septic shock in the setting of recurrent bowel perforation, feculent peritonitis S/p ex-lap with partial colectomy and wound VAC placement  Lactic acidosis Patient had abdominal closure with colostomy about 7 days ago Last night he started with increasing abdominal pain and distention, became hemodynamically unstable, emergent general surgery was called and he was taken to the OR after CT scan showing  recurrent perforation He underwent repeat ex lap with partial colectomy and wound VAC placement Currently on multiple vasopressors including vasopressin, epinephrine and norepinephrine Titrate with map goal 65 He is putting out large amount of bloody drainage and wound VAC Continue IV Zosyn Started on micafungin for possible fungal coverage Repeat blood cultures in process Trend lactate  Acute hypoxic respiratory failure 2/2 above Patient was reintubated in OR this morning Continue lung protective ventilation VAP prevention bundle in place PAD protocol with Precedex and fentanyl infusions, RASS goal -2  Mixed metabolic and respiratory acidosis Patient's ABG showing pH 7.1, pCO2 63, PaO2 131 and saturation 97% Lactate is elevated He was given 2 A of bicarbonate Respiratory rate was increased Repeat blood gas in 2 hours  Acute blood loss anemia, perioperative Patient received 3 units of PRBCs this morning He has high output from wound VAC Monitor H&H every 6 hours and transfuse if hemoglobin less than 8  History of recurrent VTE Most recently on 01/2022. On indefinite anticoagulation with eliquis Holding anticoagulation in the setting of acute blood loss anemia and repeat surgery  CKD 3A Serum creatinine is at baseline, monitor intake and output Avoid nephrotoxic agents  Demand cardiac ischemia in the setting of recurrent bowel perforation Workup has been negative for ischemia  Prediabetes A1c 5.7% Continue sliding scale with CBG goal 140-180  Multiple Myeloma AL Amyloidosis MM diagnosed in 2012, AL amyloidosis diagnosed in 2013. Remote history of bone marrow transplant. Treatment being held since August 2023 due to low energy. Continue support of care   Best Practice (right click and "Reselect all SmartList Selections" daily)   Diet/type: NPO DVT prophylaxis: SCD GI prophylaxis:  PPI  Lines: N/A  Foley:  no  Code Status:  full code Last date of multidisciplinary  goals of care discussion [12/18: Patient daughter was updated over the phone.  Decision was to continue full scope of care   This patient is critically ill with multiple organ system failure which requires frequent high complexity decision making, assessment, support, evaluation, and titration of therapies. This was completed through the application of advanced monitoring technologies and extensive interpretation of multiple databases.  During this encounter critical care time was devoted to patient care services described in this note for 42 minutes.    Jacky Kindle, MD [facility] See Amion for pager If no response to pager, please call [phone] until 7pm After 7pm, Please call E-link [phone]

## 2022-11-09 ENCOUNTER — Encounter (HOSPITAL_COMMUNITY): Payer: Self-pay | Admitting: Surgery

## 2022-11-09 DIAGNOSIS — R6521 Severe sepsis with septic shock: Secondary | ICD-10-CM | POA: Diagnosis not present

## 2022-11-09 DIAGNOSIS — R198 Other specified symptoms and signs involving the digestive system and abdomen: Secondary | ICD-10-CM | POA: Diagnosis not present

## 2022-11-09 DIAGNOSIS — A419 Sepsis, unspecified organism: Secondary | ICD-10-CM | POA: Diagnosis not present

## 2022-11-09 DIAGNOSIS — K668 Other specified disorders of peritoneum: Secondary | ICD-10-CM | POA: Diagnosis not present

## 2022-11-09 LAB — TYPE AND SCREEN
ABO/RH(D): A POS
Antibody Screen: POSITIVE
Donor AG Type: NEGATIVE
Unit division: 0
Unit division: 0
Unit division: 0
Unit division: 0
Unit division: 0

## 2022-11-09 LAB — BPAM RBC
Blood Product Expiration Date: 202312292359
Blood Product Expiration Date: 202312302359
Blood Product Expiration Date: 202312312359
Blood Product Expiration Date: 202401122359
Blood Product Expiration Date: 202401192359
ISSUE DATE / TIME: 202312162032
ISSUE DATE / TIME: 202312180539
ISSUE DATE / TIME: 202312180623
ISSUE DATE / TIME: 202312180623
ISSUE DATE / TIME: 202312180623
Unit Type and Rh: 600
Unit Type and Rh: 6200
Unit Type and Rh: 6200
Unit Type and Rh: 6200
Unit Type and Rh: 9500

## 2022-11-09 LAB — BPAM PLATELET PHERESIS
Blood Product Expiration Date: 202312212359
ISSUE DATE / TIME: 202312181258
Unit Type and Rh: 6200

## 2022-11-09 LAB — BPAM FFP
Blood Product Expiration Date: 202312192359
Blood Product Expiration Date: 202312192359
Blood Product Expiration Date: 202312192359
ISSUE DATE / TIME: 202312181258
ISSUE DATE / TIME: 202312181258
ISSUE DATE / TIME: 202312182348
Unit Type and Rh: 6200
Unit Type and Rh: 6200
Unit Type and Rh: 600

## 2022-11-09 LAB — PREPARE CRYOPRECIPITATE: Unit division: 0

## 2022-11-09 LAB — BPAM CRYOPRECIPITATE
Blood Product Expiration Date: 202312181845
ISSUE DATE / TIME: 202312181658
Unit Type and Rh: 6200

## 2022-11-09 LAB — GLUCOSE, CAPILLARY
Glucose-Capillary: 188 mg/dL — ABNORMAL HIGH (ref 70–99)
Glucose-Capillary: 182 mg/dL — ABNORMAL HIGH (ref 70–99)
Glucose-Capillary: 155 mg/dL — ABNORMAL HIGH (ref 70–99)
Glucose-Capillary: 157 mg/dL — ABNORMAL HIGH (ref 70–99)
Glucose-Capillary: 192 mg/dL — ABNORMAL HIGH (ref 70–99)
Glucose-Capillary: 209 mg/dL — ABNORMAL HIGH (ref 70–99)
Glucose-Capillary: 228 mg/dL — ABNORMAL HIGH (ref 70–99)
Glucose-Capillary: 246 mg/dL — ABNORMAL HIGH (ref 70–99)

## 2022-11-09 LAB — PREPARE FRESH FROZEN PLASMA
Unit division: 0
Unit division: 0
Unit division: 0

## 2022-11-09 LAB — POCT I-STAT 7, (LYTES, BLD GAS, ICA,H+H)
Acid-base deficit: 6 mmol/L — ABNORMAL HIGH (ref 0.0–2.0)
Acid-base deficit: 6 mmol/L — ABNORMAL HIGH (ref 0.0–2.0)
Bicarbonate: 19.6 mmol/L — ABNORMAL LOW (ref 20.0–28.0)
Bicarbonate: 19.3 mmol/L — ABNORMAL LOW (ref 20.0–28.0)
Calcium, Ion: 1.15 mmol/L (ref 1.15–1.40)
Calcium, Ion: 1.13 mmol/L — ABNORMAL LOW (ref 1.15–1.40)
HCT: 27 % — ABNORMAL LOW (ref 39.0–52.0)
HCT: 28 % — ABNORMAL LOW (ref 39.0–52.0)
Hemoglobin: 9.2 g/dL — ABNORMAL LOW (ref 13.0–17.0)
Hemoglobin: 9.5 g/dL — ABNORMAL LOW (ref 13.0–17.0)
O2 Saturation: 99 %
O2 Saturation: 94 %
Patient temperature: 100.1
Patient temperature: 38
Potassium: 4.6 mmol/L (ref 3.5–5.1)
Potassium: 5.1 mmol/L (ref 3.5–5.1)
Sodium: 145 mmol/L (ref 135–145)
Sodium: 144 mmol/L (ref 135–145)
TCO2: 21 mmol/L — ABNORMAL LOW (ref 22–32)
TCO2: 20 mmol/L — ABNORMAL LOW (ref 22–32)
pCO2 arterial: 40.1 mmHg (ref 32–48)
pCO2 arterial: 37.6 mmHg (ref 32–48)
pH, Arterial: 7.302 — ABNORMAL LOW (ref 7.35–7.45)
pH, Arterial: 7.322 — ABNORMAL LOW (ref 7.35–7.45)
pO2, Arterial: 164 mmHg — ABNORMAL HIGH (ref 83–108)
pO2, Arterial: 82 mmHg — ABNORMAL LOW (ref 83–108)

## 2022-11-09 LAB — LACTIC ACID, PLASMA
Lactic Acid, Venous: 2.7 mmol/L (ref 0.5–1.9)
Lactic Acid, Venous: 3.8 mmol/L (ref 0.5–1.9)

## 2022-11-09 LAB — BASIC METABOLIC PANEL
Anion gap: 10 (ref 5–15)
BUN: 38 mg/dL — ABNORMAL HIGH (ref 8–23)
CO2: 19 mmol/L — ABNORMAL LOW (ref 22–32)
Calcium: 7.3 mg/dL — ABNORMAL LOW (ref 8.9–10.3)
Chloride: 116 mmol/L — ABNORMAL HIGH (ref 98–111)
Creatinine, Ser: 1.7 mg/dL — ABNORMAL HIGH (ref 0.61–1.24)
GFR, Estimated: 41 mL/min — ABNORMAL LOW (ref >60)
Glucose, Bld: 208 mg/dL — ABNORMAL HIGH (ref 70–99)
Potassium: 4.5 mmol/L (ref 3.5–5.1)
Sodium: 145 mmol/L (ref 135–145)

## 2022-11-09 LAB — CBC
HCT: 28.3 % — ABNORMAL LOW (ref 39.0–52.0)
Hemoglobin: 9.9 g/dL — ABNORMAL LOW (ref 13.0–17.0)
MCH: 31.2 pg (ref 26.0–34.0)
MCHC: 35 g/dL (ref 30.0–36.0)
MCV: 89.3 fL (ref 80.0–100.0)
Platelets: 141 10*3/uL — ABNORMAL LOW (ref 150–400)
RBC: 3.17 MIL/uL — ABNORMAL LOW (ref 4.22–5.81)
RDW: 19.6 % — ABNORMAL HIGH (ref 11.5–15.5)
WBC: 23 10*3/uL — ABNORMAL HIGH (ref 4.0–10.5)
nRBC: 1 % — ABNORMAL HIGH (ref 0.0–0.2)

## 2022-11-09 LAB — PHOSPHORUS: Phosphorus: 3.8 mg/dL (ref 2.5–4.6)

## 2022-11-09 LAB — MAGNESIUM: Magnesium: 1.9 mg/dL (ref 1.7–2.4)

## 2022-11-09 LAB — PREPARE PLATELET PHERESIS: Unit division: 0

## 2022-11-09 LAB — CORTISOL: Cortisol, Plasma: 29.5 ug/dL

## 2022-11-09 MED ORDER — TRACE MINERALS CU-MN-SE-ZN 300-55-60-3000 MCG/ML IV SOLN
INTRAVENOUS | Status: AC
  Filled 2022-11-09: qty 819.2

## 2022-11-09 MED ORDER — ALBUMIN HUMAN 25 % IV SOLN
100.0000 g | Freq: Four times a day (QID) | INTRAVENOUS | Status: DC
  Filled 2022-11-09: qty 400

## 2022-11-09 MED ORDER — HYDROCORTISONE SOD SUC (PF) 100 MG IJ SOLR
100.0000 mg | Freq: Three times a day (TID) | INTRAMUSCULAR | Status: DC
  Administered 2022-11-09 – 2022-11-11 (×6): 100 mg via INTRAVENOUS
  Filled 2022-11-09 (×7): qty 2

## 2022-11-09 MED ORDER — LINEZOLID 600 MG/300ML IV SOLN
600.0000 mg | Freq: Two times a day (BID) | INTRAVENOUS | Status: DC
  Administered 2022-11-09 – 2022-11-11 (×5): 600 mg via INTRAVENOUS
  Filled 2022-11-09 (×5): qty 300

## 2022-11-09 MED ORDER — ALBUMIN HUMAN 25 % IV SOLN
100.0000 g | Freq: Four times a day (QID) | INTRAVENOUS | Status: DC
  Filled 2022-11-09 (×2): qty 400

## 2022-11-09 MED ORDER — SODIUM CHLORIDE 0.9 % IV SOLN
1.0000 g | Freq: Two times a day (BID) | INTRAVENOUS | Status: DC
  Administered 2022-11-09 – 2022-11-11 (×4): 1 g via INTRAVENOUS
  Filled 2022-11-09 (×5): qty 20

## 2022-11-09 MED ORDER — MAGNESIUM SULFATE 2 GM/50ML IV SOLN
2.0000 g | Freq: Once | INTRAVENOUS | Status: AC
  Administered 2022-11-09: 2 g via INTRAVENOUS
  Filled 2022-11-09: qty 50

## 2022-11-09 MED ORDER — LIP MEDEX EX OINT: TOPICAL_OINTMENT | CUTANEOUS | Status: DC | PRN

## 2022-11-09 MED ORDER — ALBUMIN HUMAN 25 % IV SOLN
25.0000 g | Freq: Four times a day (QID) | INTRAVENOUS | Status: AC
  Administered 2022-11-09 – 2022-11-10 (×4): 25 g via INTRAVENOUS
  Filled 2022-11-09 (×4): qty 100

## 2022-11-09 NOTE — Anesthesia Procedure Notes (Signed)
Central Venous Catheter Insertion Performed by: Oleta Mouse, MD Start/End12/22/2023 5:08 AM, 11/09/2022 5:19 AM Patient location: OR. Emergency situation Preanesthetic checklist: patient identified, IV checked and monitors and equipment checked Position: supine Hand hygiene performed  and Seldinger technique used Catheter size: 8 Fr Total catheter length 16. Central line was placed.Procedure performed using ultrasound guided technique. Ultrasound Notes:anatomy identified, needle tip was noted to be adjacent to the nerve/plexus identified, no ultrasound evidence of intravascular and/or intraneural injection and image(s) printed for medical record Following insertion, line sutured, dressing applied and Biopatch. Post procedure assessment: blood return through all ports, free fluid flow and no air  Patient tolerated the procedure well with no immediate complications.

## 2022-11-09 NOTE — Progress Notes (Signed)
PHARMACY - TOTAL PARENTERAL NUTRITION CONSULT NOTE   Indication: Prolonged ileus  Patient Measurements: Height: 6' (182.9 cm) Weight: 99.1 kg (218 lb 7.6 oz) IBW/kg (Calculated) : 77.6 TPN AdjBW (KG): 96.9 Body mass index is 29.63 kg/m.  Assessment: 76 yo male admitted on 12/9 for abdominal pain found to have peritonitis due to transverse colon perforation possibly secondary to mesenteric ischemia. S/p ex lap, creating of colostomy and abdominal closure on 12/11. Patient was extubated on 12/12 and started on a clear liquid diet, however has had minimal PO intake since admission and unable to tolerate nutritional supplements due to severe nausea. Pharmacy has been consulted to dose TPN.   Glucose / Insulin: hx pre-DM, A1c 5.7%. CBGs running high ~180-230s. SSI use: 12u/24 hours. No DM meds PTA. Electrolytes: Na 145, K 4.5, Cl 116, CoCa 9.3, Mg 1.9, Phos 3.8 Renal: Scr 1.7, BUN 38 (BL Scr ~1.5-1.7) Hepatic: LFTs slightly elevated; Alb 1.7, Tbili 2.0, TG 111 Intake / Output: +16 L this admit; UO 0.3 ml/kg/hr; NG 0 ML; wound vac 1600 mL   GI Imaging: 12/9 CTAP: suspected pneumatosis, concern for mesenteric ischemia GI Surgeries / Procedures:  12/10: s/p Ex lap, transverse colectomy and placement of abthera wound vac for bowel perforation, feculent peritonitis, mesenteric ischemia 12/11: s/p ex lap, creation of colostomy and abdominal closure including retention sutures 12/18: s/p ex lap, re-perforation - partial colectomy, temporary closure   Central access: ordered to be placed 12/14 TPN start date: 12/14  Nutritional Goals: Goal TPN rate is 80 mL/hr (provides 123 g of protein and 2293 kcals per day meeting 100% of needs)  RD Assessment: Estimated Needs Total Energy Estimated Needs: 2200-2400 Total Protein Estimated Needs: 120-140 grams Total Fluid Estimated Needs: >2.0 L  Current Nutrition:  12/14 TPN>>  12/15 CLD - Boost Breeze TID as tolerated (intermittently refusing d/t  nausea) 12/16 advancing to full liquids - Boost x2 12/16 12/17 NPO   Plan:  Continue concentrated TPN to 4m/hr at 1800 (will provide 122 g AA and 2293 kCal meeting 100% of patient needs) Electrolytes in TPN: Na 8 mEq/L, K 25 mEq/L, Ca 5 mEq/L, Mg 7 mEq/L, and Phos 5 mmol/L. Max Acetate Add standard trace elements and MVI to TPN Adjust Sensitive q4h SSI and adjust as needed  +10u insulin regular to TPN bag  Monitor TPN labs on Mon/Thurs  Thank you for allowing pharmacy to be a part of this patient's care.  MWilson Singer PharmD Clinical Pharmacist 11/09/2022 7:55 AM

## 2022-11-09 NOTE — Progress Notes (Signed)
[facility] Progress Note Patient Name: Brent Allison DOB: [birth year] MRN: [id number]   Date of Service  11/09/2022  HPI/Events of Note  ABG on 60%/PRVC 26/TV 580/P 8 = 7.302/40.1/164/19.6. Lactic Acid = 6.2 --> 4.8. Lactic Acid clearing slowly.   eICU Interventions  Plan: FiO2 weaned to 40%. Otherwise, continue present ventilator management.      Intervention Category Major Interventions: Respiratory failure - evaluation and management  Quinta Eimer Eugene 11/09/2022, 4:05 AM

## 2022-11-09 NOTE — Anesthesia Postprocedure Evaluation (Signed)
Anesthesia Post Note  Patient: ARMANY MANO III  Procedure(s) Performed: Benedetto Goad WASHOUT (Abdomen) PARTIAL COLECTOMY (Abdomen) APPLICATION OF ABTHERA  ADVANCE (Abdomen)     Patient location during evaluation: SICU Anesthesia Type: General Level of consciousness: sedated Pain management: pain level controlled Vital Signs Assessment: vitals unstable Respiratory status: patient remains intubated per anesthesia plan Cardiovascular status: unstable Postop Assessment: no apparent nausea or vomiting Anesthetic complications: no Comments: Called CCM to discuss patient upon arrival to ICU  No notable events documented.  Last Vitals:  Vitals:   11/09/22 0803 11/09/22 0836  BP:    Pulse:    Resp:    Temp: 37.1 C (!) 39.4 C  SpO2:      Last Pain:  Vitals:   11/09/22 0836  TempSrc: Oral  PainSc:                  Janyce Ellinger

## 2022-11-09 NOTE — Progress Notes (Signed)
Subjective Intubated/sedated; high VAC output initially following OR but was quite thin without clots; noted significant volumes of irrigation intraoperatively. Coags demonstrated coagulopathy - this was then correct. He also had overnight TEG which Dr. Bobbye Morton interpreted and advised 1 FFP which he got.   Family at bedside today including his brother and niece whom I also updated.  Objective: Vital signs in last 24 hours: Temp:  [96.7 F (35.9 C)-102.9 F (39.4 C)] 102.9 F (39.4 C) (12/19 0836) Pulse Rate:  [83-122] 111 (12/19 0800) Resp:  [19-39] 28 (12/19 0800) BP: (55-119)/(27-73) 87/67 (12/19 0700) SpO2:  [100 %] 100 % (12/19 0800) Arterial Line BP: (94-158)/(34-67) 108/34 (12/19 0800) FiO2 (%):  [40 %-90 %] 40 % (12/19 0400) Weight:  [99.1 kg] 99.1 kg (12/19 0500) Last BM Date : 11/06/22  Intake/Output from previous day: 12/18 0701 - 12/19 0700 In: 7389.6 [I.V.:4910.5; Blood:2329; IV Piggyback:150.1] Out: 2290 [Urine:690; Drains:1600] Intake/Output this shift: Total I/O In: -  Out: 135 [Urine:60; Drains:75]  Gen: NAD, intubated/sedated CV: RRR Pulm: Normal work of breathing Abd: Soft, VAC in place with thin serosanguinous effluent Ext: SCDs in place  Lab Results: CBC  Recent Labs    10/31/2022 2138 11/09/22 0321 11/09/22 0332  WBC 21.0* 23.0*  --   HGB 10.4* 9.9* 9.2*  HCT 29.9* 28.3* 27.0*  PLT 145* 141*  --    BMET Recent Labs    11/12/2022 1044 11/12/2022 2047 11/09/22 0321 11/09/22 0332  NA 143   < > 145 145  K 4.1   < > 4.5 4.6  CL 115*  --  116*  --   CO2 18*  --  19*  --   GLUCOSE 254*  --  208*  --   BUN 28*  --  38*  --   CREATININE 1.47*  --  1.70*  --   CALCIUM 7.6*  --  7.3*  --    < > = values in this interval not displayed.   PT/INR Recent Labs    10/29/2022 1044  LABPROT 26.7*  INR 2.5*   ABG Recent Labs    11/05/2022 2047 11/09/22 0332  PHART 7.324* 7.302*  HCO3 18.5* 19.6*     Studies/Results:  Anti-infectives: Anti-infectives (From admission, onward)    Start     Dose/Rate Route Frequency Ordered Stop   11/16/2022 1500  meropenem (MERREM) 1 g in sodium chloride 0.9 % 100 mL IVPB        1 g 200 mL/hr over 30 Minutes Intravenous Every 8 hours 10/29/2022 1410     11/12/2022 0900  micafungin (MYCAMINE) 100 mg in sodium chloride 0.9 % 100 mL IVPB        100 mg 105 mL/hr over 1 Hours Intravenous Every 24 hours 11/12/2022 0803     10/25/2022 0200  piperacillin-tazobactam (ZOSYN) IVPB 3.375 g  Status:  Discontinued        3.375 g 12.5 mL/hr over 240 Minutes Intravenous Every 8 hours 10/31/2022 0119 11/02/2022 1410   11/02/22 1200  piperacillin-tazobactam (ZOSYN) IVPB 3.375 g        3.375 g 12.5 mL/hr over 240 Minutes Intravenous Every 8 hours 11/02/22 0942 11/06/22 0120   10/31/22 0400  piperacillin-tazobactam (ZOSYN) IVPB 3.375 g  Status:  Discontinued        3.375 g 12.5 mL/hr over 240 Minutes Intravenous Every 8 hours 11/09/2022 2330 11/02/22 0942   10/27/2022 2130  piperacillin-tazobactam (ZOSYN) IVPB 3.375 g        3.375 g  100 mL/hr over 30 Minutes Intravenous  Once 10/27/2022 2120 11/09/2022 2221        Assessment/Plan: Patient Active Problem List   Diagnosis Date Noted   Acute respiratory failure with hypoxia ([place]) 10/29/2022   Malnutrition of moderate degree 11/19/2022   Septic shock ([place]) 11/15/2022   Perforated abdominal viscus 11/17/2022   Pneumoperitoneum 10/31/2022   Goals of care, counseling/discussion 01/12/2022   Cervical spondylosis with myelopathy and radiculopathy 12/21/2021   Upper respiratory infection 11/26/2021   Hemoptysis 03/17/2020   Thoracic stenosis 06/02/2015   OSA (obstructive sleep apnea) 12/05/2014   Portal vein thrombosis 11/14/2012   Pulmonary embolism ([place]) 11/13/2012   AL amyloidosis ([place]) 11/04/2012   Heparin induced thrombocytopenia (HIT) ([place]) 11/04/2012   Mesenteric venous thrombosis ([place]) 10/31/2012   DVT (deep venous  thrombosis) ([place]) 02/02/2012   Multiple myeloma ([address]) 10/01/2011   Multiple myeloma ([place]) 10/01/2011   Neuropathy 10/01/2011   Dyslipidemia    POD 9 s/p Ex lap, transverse colectomy and placement of abthera wound vac for bowel perforation, feculent peritonitis, mesenteric ischemia by Dr. Redmond Pulling on 10/31/22 POD 8 s/p ex lap, creation of colostomy and abdominal closure including retention sutures on 12/11 by Dr. Grandville Silos POD 1 s/p ex lap, partial colectomy (proximal transverse) with discontinuity, application of Abthera VAC; noted arrest in preop.   FINDINGS 12/18: Large perforation in the transverse colon just proximal to the colostomy site, with feculent peritonitis.    - Ileus Cont TPN - Abthera VAC to suction - IV abx, broad spec - Previously on argatroban infusion, with resume DOAC when taking PO better - planning return to OR possibly tomorrow for washout/2nd look and possible ostomy if vasopressor requirement is acceptable and he is clinically doing ok. Would like to see hemoglobin stable x 24hrs before restarting anticoagulation - as per primary. If restarted later today, would plan to discontinue at 6 am 11/05/2022 if possible in anticipation for OR   FEN - TPN; NG tube with strict NPO given he is in discontinuity  VTE - SCDs, On Eliquis at baseline. Argatroban gtt prior (hx HIT) ID - Zosyn (stop date 12/16) - broad spec IV abx   - Per CCM -  AKI on CKD3A - Cr improving  Amyloidosis  History of recurrent VTE: Most recently DVT in 01/2022 - On Eliquis at baseline. Argatroban gtt (hx HIT) NSTEMI ABL anemia - hgb not precipitously falling at present   LOS: 10 days   Nadeen Landau, MD [facility], [place]

## 2022-11-09 NOTE — Progress Notes (Signed)
Spoke with daughter and if there are any discussions pertaining to plans of care, especially if it involves end of life care (in the event that happens) they would like for it to be just between them and the healthcare staff. This is just to eliminate any awkwardness between family members.

## 2022-11-09 NOTE — Progress Notes (Addendum)
NAME:  Brent Allison, MRN:  [id number], DOB:  1946-07-04, LOS: 56 ADMISSION DATE:  10/23/2022, CONSULTATION DATE:  11/09/22 REFERRING MD:  Dr. Alvino Chapel ED, CHIEF COMPLAINT: Abdominal pain  History of Present Illness:  76 year old with known history of below presents with acute onset of abdominal pain about 7:30 PM, peritoneal exam, found to have viscus perforation on CT of the abdomen.  Acute onset of pain.  CT scan revealed intestinal perforation.  He takes Eliquis for VTE history.  Last dose this morning.  Received Andexxa in the ED for reversal.  Surgery was consulted.  Taken to the OR.  They recommended medical ICU admission versus surgical admission given complex medical history.  Lab notable for mild elevation in creatinine, AKI, mild leukocytosis with left shift, hemoglobin appears at baseline.  Blood cultures were obtained.  Pertinent  Medical History  History of amyloidosis, history of diastolic dysfunction although euvolemic on exam, history of DVT  Significant Hospital Events: Including procedures, antibiotic start and stop dates in addition to other pertinent events   11/16/2022 presents with acute onset abdominal pain found to have abdominal perforation 10/31/22  - Surgery : POD 0 s/p Ex lap, transverse colectomy and placement of abthera wound vac for bowel perforation, feculent peritonitis, mesenteric ischemia by Dr. Redmond Pulling on 10/31/22. - Intra-Op Findings: significant stool contamination, old periumbilical mesh, omentum and colonic tissue very friable and essentially fell apart, silk sutures marking proximal transverse & distal transverse colon.  On Eliquis at baseline. Andexxa pre-op. T  10/25/2022 - surgery: POD 0 s/p ex-lap, creation of colostomy and abdominal closure with retention sutures by Dr. Grandville Silos; holding [facility]. Tolerating PS 5 over 5, extubate. 11/04/2022: Large perforation in the transverse colon just proximal to the colostomy site, with feculent peritonitis. S/p ex  lap, partial colectomy (proximal transverse) with discontinuity, application of wound VAC; noted arrest in preop. Started on meropenum and micafungin.   Interim History / Subjective:   Patient remains intubated. Having less output from wound VAC.   Objective   Blood pressure (!) 87/67, pulse (!) 111, temperature (!) 102.9 F (39.4 C), temperature source Oral, resp. rate (!) 28, height 6' (1.829 m), weight 99.1 kg, SpO2 100 %.    Vent Mode: PRVC FiO2 (%):  [40 %-90 %] 40 % Set Rate:  [26 bmp-28 bmp] 26 bmp Vt Set:  [580 mL] 580 mL PEEP:  [8 cmH20] 8 cmH20 Plateau Pressure:  [15 cmH20-20 cmH20] 16 cmH20   Intake/Output Summary (Last 24 hours) at 11/09/2022 0857 Last data filed at 11/09/2022 4098 Gross per 24 hour  Intake 5131.64 ml  Output 1925 ml  Net 3206.64 ml    Filed Weights   11/07/22 0500 10/31/2022 0300 11/09/22 0500  Weight: 97.2 kg 98.3 kg 99.1 kg    Examination: Physical exam: General: Crtitically ill-appearing male, orally intubated HEENT:  ETT in place Neuro: Sedated Chest: Coarse breath sounds  Heart: tachycardic Abdomen: Soft, distended, wound VAC in place Skin: extremities cool and dry   Resolved Hospital Problem list     Assessment & Plan:  Severe sepsis with septic shock in the setting of recurrent bowel perforation, feculent peritonitis S/p ex-lap with partial colectomy and wound VAC placement  Lactic acidosis 12/18 s/p ex lap and partial colectomy and wound VAC in place after CT scan showed recurrent perforation. Less bloody drainage this morning from wound VAC. Currently on multiple vasopressors including vasopressin, epinephrine and norepinephrine. MAPs in 50-60. Extremities cool and dry. Preliminary blood cultures no growth.  Lactic acid trending down 4.8 from 6.2.  -on vasopressin, epinephrine, norepinephrine; increase range for levophed to 80 mcg/min -MAP goal 65 -albumin q6h x 4  -hydrocortisone q8h -on meropenem and micafungin  -start  linezolid for MRSA coverage -f/u blood cultures  -trend lactate   Acute hypoxic respiratory failure 2/2 above Patient was re intubated. Continue lung protective ventilation VAP prevention bundle in place PAD protocol with Precedex and fentanyl infusions, RASS goal -2  Mixed metabolic and respiratory acidosis Patient's ABG showing pH 7.322, pCO2 37.6, PaO2 82, Bicarb 19.3 and saturation 94%. Lactic acid trend improving.  -trend lactate  Acute blood loss anemia, perioperative Patient received 3 units of PRBCs. Serosanguinous from wound VAC. Hgb steady around 9.5. Monitor H&H every 6 hours and transfuse if hemoglobin less than 8  History of recurrent VTE Most recently on 01/2022. On indefinite anticoagulation with eliquis Holding anticoagulation in the setting of acute blood loss anemia and repeat surgery  CKD 3A Serum creatinine is at baseline, monitor intake and output Avoid nephrotoxic agents  Demand cardiac ischemia in the setting of recurrent bowel perforation Workup has been negative for ischemia  Prediabetes A1c 5.7% Continue sliding scale with CBG goal 140-180  Multiple Myeloma AL Amyloidosis MM diagnosed in 2012, AL amyloidosis diagnosed in 2013. Remote history of bone marrow transplant. Treatment being held since August 2023 due to low energy. Continue support of care   Best Practice (right click and "Reselect all SmartList Selections" daily)   Diet/type: NPO DVT prophylaxis: SCD GI prophylaxis:  PPI  Lines: N/A  Foley:  no  Code Status:  full code Last date of multidisciplinary goals of care discussion [12/19: family updated at beside]

## 2022-11-09 NOTE — Progress Notes (Signed)
PT Cancellation Note  Patient Details Name: Brent Allison MRN: [id number] DOB: 1946/11/17   Cancelled Treatment:    Reason Eval/Treat Not Completed: Patient not medically ready (pt remains intubated post sx, await medical clearance)   Evangelyn Crouse B Natiya Seelinger 11/09/2022, 6:37 AM [place] Office: [phone]

## 2022-11-09 NOTE — Anesthesia Procedure Notes (Signed)
Arterial Line Insertion Start/End12/17/2023 4:51 AM, 11/16/2022 4:57 AM Performed by: Oleta Mouse, MD, anesthesiologist  Patient location: Pre-op. Preanesthetic checklist: patient identified, IV checked and monitors and equipment checked Emergency situation Left, radial was placed Catheter size: 20 G Hand hygiene performed   Attempts: 1 Procedure performed using ultrasound guided technique. Ultrasound Notes:anatomy identified, needle tip was noted to be adjacent to the nerve/plexus identified, no ultrasound evidence of intravascular and/or intraneural injection and image(s) printed for medical record Following insertion, dressing applied and Biopatch. Post procedure assessment: normal and unchanged  Patient tolerated the procedure well with no immediate complications.

## 2022-11-09 NOTE — Progress Notes (Signed)
OT Cancellation Note  Patient Details Name: Brent Allison MRN: [id number] DOB: December 04, 1945   Cancelled Treatment:    Reason Eval/Treat Not Completed: Patient not medically ready  Danika Kluender,H[place] 11/09/2022, 9:49 AM Maurie Boettcher, OT/L   Acute OT Clinical Specialist Acute Rehabilitation Services Pager [phone] Office [phone]

## 2022-11-10 ENCOUNTER — Inpatient Hospital Stay (HOSPITAL_COMMUNITY): Payer: Medicare Other | Admitting: Registered Nurse

## 2022-11-10 ENCOUNTER — Encounter (HOSPITAL_COMMUNITY): Admission: EM | Disposition: E | Payer: Self-pay | Source: Home / Self Care | Attending: Pulmonary Disease

## 2022-11-10 ENCOUNTER — Other Ambulatory Visit: Payer: Self-pay

## 2022-11-10 DIAGNOSIS — M1990 Unspecified osteoarthritis, unspecified site: Secondary | ICD-10-CM | POA: Diagnosis not present

## 2022-11-10 DIAGNOSIS — S31609A Unspecified open wound of abdominal wall, unspecified quadrant with penetration into peritoneal cavity, initial encounter: Secondary | ICD-10-CM | POA: Diagnosis not present

## 2022-11-10 DIAGNOSIS — R198 Other specified symptoms and signs involving the digestive system and abdomen: Secondary | ICD-10-CM | POA: Diagnosis not present

## 2022-11-10 DIAGNOSIS — Z9989 Dependence on other enabling machines and devices: Secondary | ICD-10-CM

## 2022-11-10 DIAGNOSIS — G4733 Obstructive sleep apnea (adult) (pediatric): Secondary | ICD-10-CM | POA: Diagnosis not present

## 2022-11-10 DIAGNOSIS — J9601 Acute respiratory failure with hypoxia: Secondary | ICD-10-CM | POA: Diagnosis not present

## 2022-11-10 HISTORY — PX: APPLICATION OF WOUND VAC: SHX5189

## 2022-11-10 HISTORY — PX: LAPAROTOMY: SHX154

## 2022-11-10 LAB — BASIC METABOLIC PANEL
Anion gap: 8 (ref 5–15)
BUN: 49 mg/dL — ABNORMAL HIGH (ref 8–23)
CO2: 20 mmol/L — ABNORMAL LOW (ref 22–32)
Calcium: 7.5 mg/dL — ABNORMAL LOW (ref 8.9–10.3)
Chloride: 112 mmol/L — ABNORMAL HIGH (ref 98–111)
Creatinine, Ser: 1.54 mg/dL — ABNORMAL HIGH (ref 0.61–1.24)
GFR, Estimated: 46 mL/min — ABNORMAL LOW (ref >60)
Glucose, Bld: 224 mg/dL — ABNORMAL HIGH (ref 70–99)
Potassium: 5.2 mmol/L — ABNORMAL HIGH (ref 3.5–5.1)
Sodium: 140 mmol/L (ref 135–145)

## 2022-11-10 LAB — CBC
HCT: 23.6 % — ABNORMAL LOW (ref 39.0–52.0)
Hemoglobin: 7.8 g/dL — ABNORMAL LOW (ref 13.0–17.0)
MCH: 30.6 pg (ref 26.0–34.0)
MCHC: 33.1 g/dL (ref 30.0–36.0)
MCV: 92.5 fL (ref 80.0–100.0)
Platelets: 80 10*3/uL — ABNORMAL LOW (ref 150–400)
RBC: 2.55 MIL/uL — ABNORMAL LOW (ref 4.22–5.81)
RDW: 19 % — ABNORMAL HIGH (ref 11.5–15.5)
WBC: 13.5 10*3/uL — ABNORMAL HIGH (ref 4.0–10.5)
nRBC: 0.7 % — ABNORMAL HIGH (ref 0.0–0.2)

## 2022-11-10 LAB — SURGICAL PATHOLOGY

## 2022-11-10 LAB — PROTIME-INR
INR: 2.5 — ABNORMAL HIGH (ref 0.8–1.2)
Prothrombin Time: 26.5 seconds — ABNORMAL HIGH (ref 11.4–15.2)

## 2022-11-10 LAB — LACTIC ACID, PLASMA: Lactic Acid, Venous: 2.8 mmol/L (ref 0.5–1.9)

## 2022-11-10 LAB — GLUCOSE, CAPILLARY
Glucose-Capillary: 220 mg/dL — ABNORMAL HIGH (ref 70–99)
Glucose-Capillary: 207 mg/dL — ABNORMAL HIGH (ref 70–99)
Glucose-Capillary: 229 mg/dL — ABNORMAL HIGH (ref 70–99)
Glucose-Capillary: 157 mg/dL — ABNORMAL HIGH (ref 70–99)
Glucose-Capillary: 191 mg/dL — ABNORMAL HIGH (ref 70–99)

## 2022-11-10 LAB — PREPARE RBC (CROSSMATCH)

## 2022-11-10 LAB — APTT: aPTT: 43 seconds — ABNORMAL HIGH (ref 24–36)

## 2022-11-10 SURGERY — LAPAROTOMY, EXPLORATORY
Anesthesia: General | Site: Abdomen

## 2022-11-10 MED ORDER — ROCURONIUM BROMIDE 10 MG/ML (PF) SYRINGE
PREFILLED_SYRINGE | INTRAVENOUS | Status: DC | PRN
  Administered 2022-11-10: 20 mg via INTRAVENOUS
  Administered 2022-11-10: 30 mg via INTRAVENOUS

## 2022-11-10 MED ORDER — FENTANYL CITRATE (PF) 250 MCG/5ML IJ SOLN
INTRAMUSCULAR | Status: DC | PRN
  Administered 2022-11-10: 25 ug via INTRAVENOUS

## 2022-11-10 MED ORDER — MIDAZOLAM HCL 2 MG/2ML IJ SOLN
INTRAMUSCULAR | Status: DC | PRN
  Administered 2022-11-10: 2 mg via INTRAVENOUS

## 2022-11-10 MED ORDER — LIDOCAINE 2% (20 MG/ML) 5 ML SYRINGE
INTRAMUSCULAR | Status: AC
  Filled 2022-11-10: qty 5

## 2022-11-10 MED ORDER — ROCURONIUM BROMIDE 10 MG/ML (PF) SYRINGE
PREFILLED_SYRINGE | INTRAVENOUS | Status: AC
  Filled 2022-11-10: qty 10

## 2022-11-10 MED ORDER — SODIUM CHLORIDE 0.9% IV SOLUTION: Freq: Once | INTRAVENOUS | Status: AC

## 2022-11-10 MED ORDER — TRACE MINERALS CU-MN-SE-ZN 300-55-60-3000 MCG/ML IV SOLN
INTRAVENOUS | Status: AC
  Filled 2022-11-10: qty 819.2

## 2022-11-10 MED ORDER — FENTANYL CITRATE (PF) 250 MCG/5ML IJ SOLN
INTRAMUSCULAR | Status: AC
  Filled 2022-11-10: qty 5

## 2022-11-10 MED ORDER — INSULIN ASPART 100 UNIT/ML IJ SOLN
0.0000 [IU] | INTRAMUSCULAR | Status: DC
  Administered 2022-11-10 (×2): 7 [IU] via SUBCUTANEOUS
  Administered 2022-11-10 (×2): 4 [IU] via SUBCUTANEOUS
  Administered 2022-11-11: 7 [IU] via SUBCUTANEOUS
  Administered 2022-11-11 (×4): 4 [IU] via SUBCUTANEOUS
  Administered 2022-11-11: 7 [IU] via SUBCUTANEOUS
  Administered 2022-11-12 – 2022-11-13 (×10): 4 [IU] via SUBCUTANEOUS

## 2022-11-10 MED ORDER — ONDANSETRON HCL 4 MG/2ML IJ SOLN
INTRAMUSCULAR | Status: AC
  Filled 2022-11-10: qty 2

## 2022-11-10 MED ORDER — LACTATED RINGERS IV SOLN: INTRAVENOUS | Status: DC | PRN

## 2022-11-10 MED ORDER — ALBUMIN HUMAN 5 % IV SOLN: INTRAVENOUS | Status: DC | PRN

## 2022-11-10 MED ORDER — 0.9 % SODIUM CHLORIDE (POUR BTL) OPTIME
TOPICAL | Status: DC | PRN
  Administered 2022-11-10: 1000 mL

## 2022-11-10 MED ORDER — MIDAZOLAM HCL 2 MG/2ML IJ SOLN
INTRAMUSCULAR | Status: AC
  Filled 2022-11-10: qty 2

## 2022-11-10 MED ORDER — LACTATED RINGERS IV BOLUS
500.0000 mL | Freq: Once | INTRAVENOUS | Status: AC
  Administered 2022-11-10: 500 mL via INTRAVENOUS

## 2022-11-10 MED ORDER — ONDANSETRON HCL 4 MG/2ML IJ SOLN
INTRAMUSCULAR | Status: DC | PRN
  Administered 2022-11-10: 4 mg via INTRAVENOUS

## 2022-11-10 SURGICAL SUPPLY — 43 items
APL PRP STRL LF DISP 70% ISPRP (MISCELLANEOUS) ×1
APL SKNCLS STERI-STRIP NONHPOA (GAUZE/BANDAGES/DRESSINGS) ×2
BAG COUNTER SPONGE SURGICOUNT (BAG) ×1 IMPLANT
BAG SPNG CNTER NS LX DISP (BAG) ×1
BENZOIN TINCTURE PRP APPL 2/3 (GAUZE/BANDAGES/DRESSINGS) IMPLANT
BLADE CLIPPER SURG (BLADE) IMPLANT
CANISTER SUCT 3000ML PPV (MISCELLANEOUS) ×1 IMPLANT
CHLORAPREP W/TINT 26 (MISCELLANEOUS) ×1 IMPLANT
COVER SURGICAL LIGHT HANDLE (MISCELLANEOUS) ×1 IMPLANT
DRAPE LAPAROSCOPIC ABDOMINAL (DRAPES) ×1 IMPLANT
DRAPE WARM FLUID 44X44 (DRAPES) ×1 IMPLANT
DRSG TEGADERM 4X4.75 (GAUZE/BANDAGES/DRESSINGS) IMPLANT
ELECT BLADE 6.5 EXT (BLADE) IMPLANT
ELECT REM PT RETURN 9FT ADLT (ELECTROSURGICAL) ×1
ELECTRODE REM PT RTRN 9FT ADLT (ELECTROSURGICAL) ×1 IMPLANT
GAUZE SPONGE 4X4 12PLY STRL (GAUZE/BANDAGES/DRESSINGS) IMPLANT
GLOVE BIO SURGEON STRL SZ7.5 (GLOVE) ×1 IMPLANT
GLOVE INDICATOR 8.0 STRL GRN (GLOVE) ×1 IMPLANT
GOWN STRL REUS W/ TWL LRG LVL3 (GOWN DISPOSABLE) ×1 IMPLANT
GOWN STRL REUS W/ TWL XL LVL3 (GOWN DISPOSABLE) ×1 IMPLANT
GOWN STRL REUS W/TWL LRG LVL3 (GOWN DISPOSABLE) ×1
GOWN STRL REUS W/TWL XL LVL3 (GOWN DISPOSABLE) ×1
HANDLE SUCTION POOLE (INSTRUMENTS) ×1 IMPLANT
KIT BASIN OR (CUSTOM PROCEDURE TRAY) ×1 IMPLANT
KIT TURNOVER KIT B (KITS) ×1 IMPLANT
LIGASURE IMPACT 36 18CM CVD LR (INSTRUMENTS) IMPLANT
NS IRRIG 1000ML POUR BTL (IV SOLUTION) ×2 IMPLANT
PACK GENERAL/GYN (CUSTOM PROCEDURE TRAY) ×1 IMPLANT
PAD ARMBOARD 7.5X6 YLW CONV (MISCELLANEOUS) ×1 IMPLANT
PENCIL SMOKE EVACUATOR (MISCELLANEOUS) ×1 IMPLANT
SPECIMEN JAR LARGE (MISCELLANEOUS) IMPLANT
SPONGE ABD ABTHERA ADVANCE (MISCELLANEOUS) IMPLANT
SPONGE T-LAP 18X18 ~~LOC~~+RFID (SPONGE) IMPLANT
STAPLER VISISTAT 35W (STAPLE) ×1 IMPLANT
SUCTION POOLE HANDLE (INSTRUMENTS) ×1
SUT PDS AB 1 TP1 96 (SUTURE) ×2 IMPLANT
SUT SILK 2 0 SH CR/8 (SUTURE) ×1 IMPLANT
SUT SILK 2 0 TIES 10X30 (SUTURE) ×1 IMPLANT
SUT SILK 3 0 SH CR/8 (SUTURE) ×1 IMPLANT
SUT SILK 3 0 TIES 10X30 (SUTURE) ×1 IMPLANT
TOWEL GREEN STERILE (TOWEL DISPOSABLE) ×1 IMPLANT
TRAY FOLEY MTR SLVR 16FR STAT (SET/KITS/TRAYS/PACK) ×1 IMPLANT
YANKAUER SUCT BULB TIP NO VENT (SUCTIONS) IMPLANT

## 2022-11-10 NOTE — Op Note (Signed)
11/19/2022  4:08 PM  PATIENT:  Brent Allison  76 y.o. male  Patient Care Team: Michael Boston, MD as PCP - General (Internal Medicine)  PRE-OPERATIVE DIAGNOSIS:  Open abdomen, history of perforated viscus  POST-OPERATIVE DIAGNOSIS:  Same  PROCEDURE:   Exploratory laparotomy with abdominal washout Application of temporary abdominal closure device VAC Darlyne Russian)  SURGEON:  Sharon Mt. Dema Severin, MD  ASSISTANT: Barkley Boards PA-C  ANESTHESIA:   general  COUNTS:  Sponge, needle and instrument counts were reported correct x2 at the conclusion of the operation. 1 laparotomy sponge is intentionally left in situ for application of pressure/hemostasis  EBL: 50 mL; ~1L of old clot evacuated from the abdomen  DRAINS: None  SPECIMEN: None  COMPLICATIONS: None  FINDINGS: Extremely friable and fragile tissues. Omentum with minimal movement began to tear and was quite friable. All surfaces still quite friable despite 2U FFP.  There was ~1L of old blood clot coating the visceral surfaces. Portions of exposed small bowel were evaluated and there was one segment with 'tiger striping' with portions of viability between. Abdomen washed out and Abthera placed. 1 sponge intentionally left in situ over oozing omentum.  DISPOSITION: PACU in satisfactory condition  DESCRIPTION: The patient was identified in the intensive care unit and taken to the OR where he was placed on the operating room table. SCDs were in place. General anesthesia was induced without difficulty. He was then prepped and draped in the usual sterile fashion. A surgical timeout was performed indicating the correct patient, procedure, positioning and need for preoperative antibiotics.   The ABThera bowel sponge was taken down.  Approximately 1 L of clot is encountered coating the surface of the viscera.  This is all removed.  We began by systematically exploring the abdomen.  The omentum in the upper abdomen is quite friable and  fragile.  With minimal manipulation, the omentum would begin to tear and then issues.  The abdomen was irrigated.  We attempted to run the small bowel but in doing so, encountered further oozing from raw surfaces including his mesentery.  There was 1 area of "tiger striping" on the small bowel with a intervening areas of viability.  There is no frankly ischemic intestine.  There is no evident perforation or succus throughout the abdomen.  Given all these findings, we opted to complete the abdominal washout and place another ABThera VAC.  1 laparotomy sponge was intentionally left in situ over the friable omentum that had some raw surface oozing.  In doing so, this provided hemostasis.  The ABThera bowel bag was placed.  Blue foam was placed.  The VAC tape was applied. In applying benzoin with minimal pressure, skin in the left groin is quite fragile and would tear. Skin staples where used to re-approximate this skin. The VAC was then placed to suction with a good seal.  With the exception of the 1 sponge in situ, all sponge, needle, and instrument counts were reported correct.  He was then transferred onto a stretcher for transfer back to the intensive care unit in stable but critical condition.  Tentative plan will be for ongoing resuscitation in the ICU with potential return to the OR on Friday if clinically reasonably well.

## 2022-11-10 NOTE — Progress Notes (Signed)
Findings of surgery reviewed with one of his daughters, Caryl Pina.  I reviewed things with Dr. Ander Slade with plans to check coags, monitor/correct as necessary  Tentative plan will be to return to OR in next 48 hrs for washout and hopefully re-maturation of ostomy/closure.   Nadeen Landau, MD [facility], [facility]

## 2022-11-10 NOTE — Anesthesia Procedure Notes (Signed)
Date/Time: 11/18/2022 3:44 PM  Performed by: Dorthea Cove, CRNAPatient Re-evaluated:Patient Re-evaluated prior to induction Oxygen Delivery Method: Circle system utilized Preoxygenation: Pre-oxygenation with 100% oxygen Induction Type: Inhalational induction with existing ETT

## 2022-11-10 NOTE — OR Nursing (Signed)
Dr. Dema Severin packed one Sponge in Abdomen for packing

## 2022-11-10 NOTE — Progress Notes (Signed)
PHARMACY - TOTAL PARENTERAL NUTRITION CONSULT NOTE   Indication: Prolonged ileus  Patient Measurements: Height: 6' (182.9 cm) Weight: 108.6 kg (239 lb 6.7 oz) IBW/kg (Calculated) : 77.6 TPN AdjBW (KG): 96.9 Body mass index is 32.47 kg/m.  Assessment: 76 yo male admitted on 12/9 for abdominal pain found to have peritonitis due to transverse colon perforation possibly secondary to mesenteric ischemia. S/p ex lap, creating of colostomy and abdominal closure on 12/11. Patient was extubated on 12/12 and started on a clear liquid diet, however has had minimal PO intake since admission and unable to tolerate nutritional supplements due to severe nausea. Pharmacy has been consulted to dose TPN.   Glucose / Insulin: hx pre-DM, A1c 5.7%. CBGs running high 207-220 with 10 units regular insulin in TPN + sSSI use: 15u/24 hours. No DM meds PTA. Electrolytes: Na 140, K 5.2 (hemolysis noted), Cl 112, CoCa 9.3, Mg 1.9, Phos 3.8 Renal: Scr down some at 1.54, BUN 49 (BL Scr ~1.5-1.7) Hepatic: LFTs slightly elevated; Alb 1.7, Tbili 2.0, TG 111 Intake / Output: +20 L this admit; UOP 0.5 ml/kg/hr; NG 0 ML; wound vac 1600 mL   GI Imaging: 12/9 CTAP: suspected pneumatosis, concern for mesenteric ischemia GI Surgeries / Procedures:  12/10: s/p Ex lap, transverse colectomy and placement of abthera wound vac for bowel perforation, feculent peritonitis, mesenteric ischemia 12/11: s/p ex lap, creation of colostomy and abdominal closure including retention sutures 12/18: s/p ex lap, re-perforation - partial colectomy, temporary closure   Central access: ordered to be placed 12/14 TPN start date: 12/14  Nutritional Goals: Goal TPN rate is 80 mL/hr (provides 123 g of protein and 2293 kcals per day meeting 100% of needs)  RD Assessment: Estimated Needs Total Energy Estimated Needs: 2200-2400 Total Protein Estimated Needs: 120-140 grams Total Fluid Estimated Needs: >2.0 L  Current Nutrition:  12/14 TPN>>   12/15 CLD - Boost Breeze TID as tolerated (intermittently refusing d/t nausea) 12/16 advancing to full liquids - Boost x2 12/16 12/17 NPO   Plan:  Continue concentrated TPN to 8m/hr at 1800 (will provide 122 g AA and 2293 kCal meeting 100% of patient needs) Electrolytes in TPN: Na 8 mEq/L, K 25 mEq/L, Ca 5 mEq/L, Mg 7 mEq/L, and Phos 5 mmol/L. Max Acetate Add standard trace elements and MVI to TPN Increase to  Resistant q4h SSI and adjust as needed  Increase to 20 units insulin regular in TPN bag  Monitor TPN labs on Mon/Thurs  Thank you for allowing pharmacy to be a part of this patient's care.  MWilson Singer PharmD Clinical Pharmacist 10/25/2022 9:38 AM

## 2022-11-10 NOTE — Progress Notes (Signed)
PT Cancellation Note  Patient Details Name: Brent Allison MRN: [id number] DOB: 07-27-1946   Cancelled Treatment:    Reason Eval/Treat Not Completed: Medical issues which prohibited therapy (Pt going to OR per nurse and not appropriate for today. Will return tomorrow.)   Alvira Philips 10/24/2022, 8:46 AM Jancarlos Thrun M,PT Acute Rehab Services [phone]

## 2022-11-10 NOTE — Progress Notes (Signed)
Pt in the OR at this time. Routine vent check to be performed once pt returns to unit.

## 2022-11-10 NOTE — Anesthesia Postprocedure Evaluation (Signed)
Anesthesia Post Note  Patient: Brent Allison  Procedure(s) Performed: EXPLORATORY LAPAROTOMY, ABDOMINAL [place] OUT (Abdomen) WOUND VAC CHANGE (Abdomen)     Patient location during evaluation: ICU Anesthesia Type: General Level of consciousness: sedated Pain management: pain level controlled Vital Signs Assessment: post-procedure vital signs reviewed and stable Respiratory status: patient remains intubated per anesthesia plan Cardiovascular status: stable Postop Assessment: no apparent nausea or vomiting Anesthetic complications: no   No notable events documented.  Last Vitals:  Vitals:   11/03/2022 1620 10/26/2022 1645  BP: 105/87   Pulse: 76 78  Resp: (!) 28 (!) 28  Temp:  36.7 C  SpO2: 100% 96%    Last Pain:  Vitals:   11/02/2022 1645  TempSrc: Rectal  PainSc:                  Brent Allison Brent Allison

## 2022-11-10 NOTE — Progress Notes (Signed)
Dr. Ander Slade notified about results of DIC panel. No new orders received

## 2022-11-10 NOTE — Progress Notes (Signed)
Subjective Intubated/sedated; high VAC output initially following OR but was quite thin without clots; noted significant volumes of irrigation intraoperatively. Coags demonstrated coagulopathy - this was then correct. He also had overnight TEG which Dr. Bobbye Morton interpreted and advised 1 FFP which he got.   Family at bedside today including his brother and niece whom I also updated.  Objective: Vital signs in last 24 hours: Temp:  [96.7 F (35.9 C)-102.9 F (39.4 C)] 102.9 F (39.4 C) (12/19 0836) Pulse Rate:  [83-122] 111 (12/19 0800) Resp:  [19-39] 28 (12/19 0800) BP: (55-119)/(27-73) 87/67 (12/19 0700) SpO2:  [100 %] 100 % (12/19 0800) Arterial Line BP: (94-158)/(34-67) 108/34 (12/19 0800) FiO2 (%):  [40 %-90 %] 40 % (12/19 0400) Weight:  [99.1 kg] 99.1 kg (12/19 0500) Last BM Date : 11/06/22  Intake/Output from previous day: 12/18 0701 - 12/19 0700 In: 7389.6 [I.V.:4910.5; Blood:2329; IV Piggyback:150.1] Out: 2290 [Urine:690; Drains:1600] Intake/Output this shift: Total I/O In: -  Out: 135 [Urine:60; Drains:75]  Gen: NAD, intubated/sedated CV: RRR Pulm: Normal work of breathing Abd: Soft, VAC in place with thin serosanguinous effluent Ext: SCDs in place  Lab Results: CBC  Recent Labs    10/28/2022 2138 11/09/22 0321 11/09/22 0332  WBC 21.0* 23.0*  --   HGB 10.4* 9.9* 9.2*  HCT 29.9* 28.3* 27.0*  PLT 145* 141*  --    BMET Recent Labs    11/16/2022 1044 11/06/2022 2047 11/09/22 0321 11/09/22 0332  NA 143   < > 145 145  K 4.1   < > 4.5 4.6  CL 115*  --  116*  --   CO2 18*  --  19*  --   GLUCOSE 254*  --  208*  --   BUN 28*  --  38*  --   CREATININE 1.47*  --  1.70*  --   CALCIUM 7.6*  --  7.3*  --    < > = values in this interval not displayed.   PT/INR Recent Labs    11/15/2022 1044  LABPROT 26.7*  INR 2.5*   ABG Recent Labs    10/29/2022 2047 11/09/22 0332  PHART 7.324* 7.302*  HCO3 18.5* 19.6*     Studies/Results:  Anti-infectives: Anti-infectives (From admission, onward)    Start     Dose/Rate Route Frequency Ordered Stop   10/29/2022 1500  meropenem (MERREM) 1 g in sodium chloride 0.9 % 100 mL IVPB        1 g 200 mL/hr over 30 Minutes Intravenous Every 8 hours 11/21/2022 1410     11/19/2022 0900  micafungin (MYCAMINE) 100 mg in sodium chloride 0.9 % 100 mL IVPB        100 mg 105 mL/hr over 1 Hours Intravenous Every 24 hours 10/31/2022 0803     11/09/2022 0200  piperacillin-tazobactam (ZOSYN) IVPB 3.375 g  Status:  Discontinued        3.375 g 12.5 mL/hr over 240 Minutes Intravenous Every 8 hours 11/07/2022 0119 10/29/2022 1410   11/02/22 1200  piperacillin-tazobactam (ZOSYN) IVPB 3.375 g        3.375 g 12.5 mL/hr over 240 Minutes Intravenous Every 8 hours 11/02/22 0942 11/06/22 0120   10/31/22 0400  piperacillin-tazobactam (ZOSYN) IVPB 3.375 g  Status:  Discontinued        3.375 g 12.5 mL/hr over 240 Minutes Intravenous Every 8 hours 11/19/2022 2330 11/02/22 0942   10/24/2022 2130  piperacillin-tazobactam (ZOSYN) IVPB 3.375 g        3.375 g  100 mL/hr over 30 Minutes Intravenous  Once 11/02/2022 2120 10/31/2022 2221        Assessment/Plan: Patient Active Problem List   Diagnosis Date Noted   Acute respiratory failure with hypoxia ([place]) 10/31/2022   Malnutrition of moderate degree 11/09/2022   Septic shock ([place]) 10/29/2022   Perforated abdominal viscus 11/17/2022   Pneumoperitoneum 11/05/2022   Goals of care, counseling/discussion 01/12/2022   Cervical spondylosis with myelopathy and radiculopathy 12/21/2021   Upper respiratory infection 11/26/2021   Hemoptysis 03/17/2020   Thoracic stenosis 06/02/2015   OSA (obstructive sleep apnea) 12/05/2014   Portal vein thrombosis 11/14/2012   Pulmonary embolism ([place]) 11/13/2012   AL amyloidosis ([place]) 11/04/2012   Heparin induced thrombocytopenia (HIT) ([place]) 11/04/2012   Mesenteric venous thrombosis ([place]) 10/31/2012   DVT (deep venous  thrombosis) ([place]) 02/02/2012   Multiple myeloma ([place]) 10/01/2011   Multiple myeloma ([place]) 10/01/2011   Neuropathy 10/01/2011   Dyslipidemia    POD 10 s/p Ex lap, transverse colectomy and placement of abthera wound vac for bowel perforation, feculent peritonitis, mesenteric ischemia by Dr. Redmond Pulling on 10/31/22 POD 9 s/p ex lap, creation of colostomy and abdominal closure including retention sutures on 12/11 by Dr. Grandville Silos POD 2 s/p ex lap, partial colectomy (proximal transverse) with discontinuity, application of Abthera VAC; noted arrest in preop.   FINDINGS 12/18: Large perforation in the transverse colon just proximal to the colostomy site, with feculent peritonitis.    - Ileus Cont TPN - Abthera VAC to suction - IV abx, broad spec - Previously on argatroban infusion, with resume DOAC when taking PO better - planning return to OR today for washout/2nd look and possible ostomy if vasopressor requirement is acceptable and he is clinically doing ok. Would like to see hemoglobin stable x 24hrs before restarting anticoagulation - as per primary. If restarted later today, would plan to discontinue at 6 am 11/15/2022 if possible in anticipation for OR   FEN - TPN; NG tube with strict NPO given he is in discontinuity  VTE - SCDs, On Eliquis at baseline. Argatroban gtt prior (hx HIT) ID - Zosyn (stop date 12/16) - broad spec IV abx   - Per CCM -  AKI on CKD3A - Cr improving  Amyloidosis  History of recurrent VTE: Most recently DVT in 01/2022 - On Eliquis at baseline. Argatroban gtt (hx HIT) NSTEMI ABL anemia - transfuse 1U PRBC this morning - ordered  Recheck coags - PT/INR, PTT  -The anatomy of the GI tract was discussed was reviewed with his daughter Holli Humbles at bedside. We have discussed his prior surgeries and findings as well as plan for today  - We discussed return to OR with exploratory laparotomy/abdominal washout, possible bowel resection if indicated, possible re-creation of  ostomy, scenarios where abdomen may remain open as well. -The planned procedure, material risks (including, but not limited to, pain, bleeding, infection, scarring, need for blood transfusion, damage to surrounding structures- blood vessels/nerves/viscus/organs, damage to ureter, leak from anastomosis if one is created, need for additional procedures, need for stoma which may be permanent, worsening of pre-existing medical conditions, hernia, recurrence, pneumonia, heart attack, stroke, death) benefits and alternatives to surgery were discussed at length. Ashley's questions were answered to her satisfaction, she voiced understanding and elected to proceed with surgery.  CRITICAL CARE Performed by: Ileana Roup   Total critical care time: 35 minutes  Critical care time was exclusive of separately billable procedures and treating other patients.  Critical care was necessary to treat  or prevent imminent or life-threatening deterioration.  Critical care was time spent personally by me on the following activities: development of treatment plan with surrogate as well as nursing, evaluation of patient's response to treatment, examination of patient, obtaining history from surrogate, ordering and performing treatments and interventions, ordering and review of laboratory studies, pulse oximetry, re-evaluation of patient's condition.   LOS: 10 days   Nadeen Landau, MD [facility], [place]

## 2022-11-10 NOTE — Transfer of Care (Signed)
Immediate Anesthesia Transfer of Care Note  Patient: Brent Allison  Procedure(s) Performed: EXPLORATORY LAPAROTOMY, ABDOMINAL [place] OUT (Abdomen) WOUND VAC CHANGE (Abdomen)  Patient Location: ICU  Anesthesia Type:General  Level of Consciousness: Patient remains intubated per anesthesia plan  Airway & Oxygen Therapy: Patient remains intubated per anesthesia plan and Patient placed on Ventilator (see vital sign flow sheet for setting)  Post-op Assessment: Report given to RN and Post -op Vital signs reviewed and stable  Post vital signs: Reviewed and stable  Last Vitals: See ICU vitals Vitals Value Taken Time  BP    Temp    Pulse 77 11/18/2022 1631  Resp 28 11/07/2022 1631  SpO2 95 % 11/19/2022 1631  Vitals shown include unvalidated device data.  Last Pain:  Vitals:   10/25/2022 1515  TempSrc: Axillary  PainSc:       Patients Stated Pain Goal: 2 (71/06/26 9485)  Complications: No notable events documented.

## 2022-11-10 NOTE — Progress Notes (Addendum)
NAME:  Brent Allison, MRN:  [id number], DOB:  [birth year], LOS: 48 ADMISSION DATE:  10/31/2022, CONSULTATION DATE:  11/21/2022 REFERRING MD:  Dr. Alvino Chapel ED, CHIEF COMPLAINT: Abdominal pain  History of Present Illness:  76 year old with known history of below presents with acute onset of abdominal pain about 7:30 PM, peritoneal exam, found to have viscus perforation on CT of the abdomen.  Acute onset of pain.  CT scan revealed intestinal perforation.  He takes Eliquis for VTE history.  Last dose this morning.  Received Andexxa in the ED for reversal.  Surgery was consulted.  Taken to the OR.  They recommended medical ICU admission versus surgical admission given complex medical history.  Lab notable for mild elevation in creatinine, AKI, mild leukocytosis with left shift, hemoglobin appears at baseline.  Blood cultures were obtained.  Pertinent  Medical History  History of amyloidosis, history of diastolic dysfunction although euvolemic on exam, history of DVT  Significant Hospital Events: Including procedures, antibiotic start and stop dates in addition to other pertinent events   10/31/2022 presents with acute onset abdominal pain found to have abdominal perforation 10/31/22  - Surgery : POD 0 s/p Ex lap, transverse colectomy and placement of abthera wound vac for bowel perforation, feculent peritonitis, mesenteric ischemia by Dr. Redmond Pulling on 10/31/22. - Intra-Op Findings: significant stool contamination, old periumbilical mesh, omentum and colonic tissue very friable and essentially fell apart, silk sutures marking proximal transverse & distal transverse colon.  On Eliquis at baseline. Andexxa pre-op. T  10/27/2022 - surgery: POD 0 s/p ex-lap, creation of colostomy and abdominal closure with retention sutures by Dr. Grandville Silos; holding [facility]. Tolerating PS 5 over 5, extubate. 10/23/2022: Large perforation in the transverse colon just proximal to the colostomy site, with feculent peritonitis. S/p ex  lap, partial colectomy (proximal transverse) with discontinuity, application of wound VAC; noted arrest in preop. Started on meropenum and micafungin.  11/09/22: Started on linezolid for MRSA coverage  Interim History / Subjective:  Patient remains intubated. No fevers overnight and MAP improved from yesterday.   Objective   Blood pressure (!) 164/58, pulse 77, temperature 98.9 F (37.2 C), temperature source Oral, resp. rate (!) 28, height 6' (1.829 m), weight 108.6 kg, SpO2 100 %.    Vent Mode: PRVC FiO2 (%):  [40 %] 40 % Set Rate:  [28 bmp] 28 bmp Vt Set:  [580 mL] 580 mL PEEP:  [8 cmH20] 8 cmH20 Plateau Pressure:  [20 cmH20-22 cmH20] 22 cmH20   Intake/Output Summary (Last 24 hours) at 11/05/2022 7017 Last data filed at 10/25/2022 0600 Gross per 24 hour  Intake 5122.68 ml  Output 1860 ml  Net 3262.68 ml    Filed Weights   10/23/2022 0300 11/09/22 0500 11/12/2022 0500  Weight: 98.3 kg 99.1 kg 108.6 kg    Examination: Physical exam: General: ill-appearing male, orally intubated HEENT:  ETT in place Neuro: Sedated Chest: mechanically ventilated breath sounds Heart: normal rate Abdomen: Soft, distended, wound VAC in place Skin: LE extremities cool and dry, generalized edema   Resolved Hospital Problem list     Assessment & Plan:  Severe sepsis with septic shock in the setting of recurrent bowel perforation, feculent peritonitis S/p ex-lap with partial colectomy and wound VAC placement  Lactic acidosis 12/18 s/p ex lap and partial colectomy and wound VAC in place after CT scan showed recurrent perforation. Still having serosanguinous drainage from wound VAC. MAPs improved to 70s after albumin and steroids. Currently on vasopressin and norepinephrine. Preliminary blood  cultures no growth x 2 days. Lactic acid down to 2.8.  -on vasopressin, norepinephrine; stopped epinephrine  -MAP goal 65 -hydrocortisone q8h -on meropenem, micafungin, linezolid -f/u blood cultures   -trend lactate  Acute hypoxic respiratory failure 2/2 above Patient was re intubated. Continue lung protective ventilation VAP prevention bundle in place PAD protocol with Precedex and fentanyl infusions, RASS goal -2  Mixed metabolic and respiratory acidosis Last ABG pH 7.322, pCO2 37.6, PaO2 82, Bicarb 19.3 and saturation 94%. Lactic acid trending down 2.8  from 6.2 on 12/18.  -trend lactate  Acute blood loss anemia, perioperative Patient received 3 units of PRBCs. Serosanguinous from wound VAC. Hgb dropped to 7.8. Other cell lines down as well. Has received IV fluids.  Surgery ordered 1 unit PRBC today.  -plan to transfuse 1 unit PRBC today -f/u on H&H  History of recurrent VTE Most recently on 01/2022. On indefinite anticoagulation with eliquis Holding anticoagulation in the setting of acute blood loss anemia and repeat surgery  CKD 3A Serum creatinine is at baseline, monitor intake and output Avoid nephrotoxic agents  Demand cardiac ischemia in the setting of recurrent bowel perforation Workup has been negative for ischemia  Prediabetes A1c 5.7% Continue sliding scale with CBG goal 140-180. CBG elevated in 200s, will adjust SSI.   Multiple Myeloma AL Amyloidosis MM diagnosed in 2012, AL amyloidosis diagnosed in 2013. Remote history of bone marrow transplant. Treatment being held since August 2023 due to low energy. Continue support of care   Best Practice (right click and "Reselect all SmartList Selections" daily)   Diet/type: NPO DVT prophylaxis: SCD GI prophylaxis:  PPI  Lines: Central line and Arterial Line  Foley:  yes  Code Status:  full code Last date of multidisciplinary goals of care discussion [12/20: daughter at bedside]

## 2022-11-10 NOTE — Anesthesia Preprocedure Evaluation (Addendum)
Anesthesia Evaluation  Patient identified by MRN, date of birth, ID band Patient unresponsive    Reviewed: Allergy & Precautions, NPO status , Patient's Chart, lab work & pertinent test results  Airway Mallampati: Intubated  TM Distance: >3 FB Neck ROM: Full    Dental no notable dental hx.    Pulmonary sleep apnea and Continuous Positive Airway Pressure Ventilation , former smoker Intubated      + intubated    Cardiovascular + Peripheral Vascular Disease and + DVT  Normal cardiovascular exam     Neuro/Psych Sedated  Neuromuscular disease    GI/Hepatic   Endo/Other    Renal/GU Renal InsufficiencyRenal disease     Musculoskeletal  (+) Arthritis ,    Abdominal   Peds  Hematology  (+) Blood dyscrasia, anemia INR: 2.5 Thrombocytopenia   Anesthesia Other Findings Open Abdomen  Reproductive/Obstetrics                             Anesthesia Physical Anesthesia Plan  ASA: 4  Anesthesia Plan: General   Post-op Pain Management:    Induction:   PONV Risk Score and Plan: 2 and Ondansetron, Midazolam and Treatment may vary due to age or medical condition  Airway Management Planned: Oral ETT  Additional Equipment:   Intra-op Plan:   Post-operative Plan: Post-operative intubation/ventilation  Informed Consent: I have reviewed the patients History and Physical, chart, labs and discussed the procedure including the risks, benefits and alternatives for the proposed anesthesia with the patient or authorized representative who has indicated his/her understanding and acceptance.     Dental advisory given and Consent reviewed with POA  Plan Discussed with:   Anesthesia Plan Comments: (Norepinephrine drip )       Anesthesia Quick Evaluation

## 2022-11-11 ENCOUNTER — Encounter (HOSPITAL_COMMUNITY): Payer: Self-pay | Admitting: Surgery

## 2022-11-11 DIAGNOSIS — R198 Other specified symptoms and signs involving the digestive system and abdomen: Secondary | ICD-10-CM | POA: Diagnosis not present

## 2022-11-11 DIAGNOSIS — J9601 Acute respiratory failure with hypoxia: Secondary | ICD-10-CM | POA: Diagnosis not present

## 2022-11-11 LAB — CBC
HCT: 23.1 % — ABNORMAL LOW (ref 39.0–52.0)
HCT: 22.8 % — ABNORMAL LOW (ref 39.0–52.0)
Hemoglobin: 8 g/dL — ABNORMAL LOW (ref 13.0–17.0)
Hemoglobin: 7.7 g/dL — ABNORMAL LOW (ref 13.0–17.0)
MCH: 30.5 pg (ref 26.0–34.0)
MCH: 29.8 pg (ref 26.0–34.0)
MCHC: 34.6 g/dL (ref 30.0–36.0)
MCHC: 33.8 g/dL (ref 30.0–36.0)
MCV: 88.2 fL (ref 80.0–100.0)
MCV: 88.4 fL (ref 80.0–100.0)
Platelets: 60 10*3/uL — ABNORMAL LOW (ref 150–400)
Platelets: 54 10*3/uL — ABNORMAL LOW (ref 150–400)
RBC: 2.62 MIL/uL — ABNORMAL LOW (ref 4.22–5.81)
RBC: 2.58 MIL/uL — ABNORMAL LOW (ref 4.22–5.81)
RDW: 18.9 % — ABNORMAL HIGH (ref 11.5–15.5)
RDW: 19 % — ABNORMAL HIGH (ref 11.5–15.5)
WBC: 9.5 10*3/uL (ref 4.0–10.5)
WBC: 8.2 10*3/uL (ref 4.0–10.5)
nRBC: 1 % — ABNORMAL HIGH (ref 0.0–0.2)
nRBC: 2.5 % — ABNORMAL HIGH (ref 0.0–0.2)

## 2022-11-11 LAB — PREPARE FRESH FROZEN PLASMA
Unit division: 0
Unit division: 0
Unit division: 0

## 2022-11-11 LAB — COMPREHENSIVE METABOLIC PANEL
ALT: 48 U/L — ABNORMAL HIGH (ref 0–44)
AST: 83 U/L — ABNORMAL HIGH (ref 15–41)
Albumin: 2.2 g/dL — ABNORMAL LOW (ref 3.5–5.0)
Alkaline Phosphatase: 85 U/L (ref 38–126)
Anion gap: 7 (ref 5–15)
BUN: 51 mg/dL — ABNORMAL HIGH (ref 8–23)
CO2: 22 mmol/L (ref 22–32)
Calcium: 8.1 mg/dL — ABNORMAL LOW (ref 8.9–10.3)
Chloride: 111 mmol/L (ref 98–111)
Creatinine, Ser: 1.33 mg/dL — ABNORMAL HIGH (ref 0.61–1.24)
GFR, Estimated: 55 mL/min — ABNORMAL LOW (ref >60)
Glucose, Bld: 219 mg/dL — ABNORMAL HIGH (ref 70–99)
Potassium: 4.6 mmol/L (ref 3.5–5.1)
Sodium: 140 mmol/L (ref 135–145)
Total Bilirubin: 1.9 mg/dL — ABNORMAL HIGH (ref 0.3–1.2)
Total Protein: 4.6 g/dL — ABNORMAL LOW (ref 6.5–8.1)

## 2022-11-11 LAB — TRIGLYCERIDES: Triglycerides: 113 mg/dL (ref <150)

## 2022-11-11 LAB — BPAM FFP
Blood Product Expiration Date: 202312252359
Blood Product Expiration Date: 202312252359
Blood Product Expiration Date: 202312252359
ISSUE DATE / TIME: 202312201300
ISSUE DATE / TIME: 202312201413
ISSUE DATE / TIME: 202312201548
Unit Type and Rh: 6200
Unit Type and Rh: 6200
Unit Type and Rh: 600

## 2022-11-11 LAB — GLUCOSE, CAPILLARY
Glucose-Capillary: 200 mg/dL — ABNORMAL HIGH (ref 70–99)
Glucose-Capillary: 209 mg/dL — ABNORMAL HIGH (ref 70–99)
Glucose-Capillary: 224 mg/dL — ABNORMAL HIGH (ref 70–99)
Glucose-Capillary: 178 mg/dL — ABNORMAL HIGH (ref 70–99)
Glucose-Capillary: 169 mg/dL — ABNORMAL HIGH (ref 70–99)
Glucose-Capillary: 183 mg/dL — ABNORMAL HIGH (ref 70–99)

## 2022-11-11 LAB — PHOSPHORUS: Phosphorus: 2.7 mg/dL (ref 2.5–4.6)

## 2022-11-11 LAB — MAGNESIUM: Magnesium: 2.4 mg/dL (ref 1.7–2.4)

## 2022-11-11 LAB — PROTIME-INR
INR: 1.5 — ABNORMAL HIGH (ref 0.8–1.2)
Prothrombin Time: 17.7 seconds — ABNORMAL HIGH (ref 11.4–15.2)

## 2022-11-11 LAB — DIC (DISSEMINATED INTRAVASCULAR COAGULATION)PANEL
D-Dimer, Quant: 1.67 ug/mL-FEU — ABNORMAL HIGH (ref 0.00–0.50)
Fibrinogen: 476 mg/dL — ABNORMAL HIGH (ref 210–475)
INR: 1.9 — ABNORMAL HIGH (ref 0.8–1.2)
Platelets: 51 10*3/uL — ABNORMAL LOW (ref 150–400)
Prothrombin Time: 21.6 seconds — ABNORMAL HIGH (ref 11.4–15.2)
Smear Review: NONE SEEN
aPTT: 32 seconds (ref 24–36)

## 2022-11-11 LAB — RETICULOCYTES
Immature Retic Fract: 31 % — ABNORMAL HIGH (ref 2.3–15.9)
RBC.: 2.54 MIL/uL — ABNORMAL LOW (ref 4.22–5.81)
Retic Count, Absolute: 37.1 10*3/uL (ref 19.0–186.0)
Retic Ct Pct: 1.5 % (ref 0.4–3.1)

## 2022-11-11 MED ORDER — HYDROCORTISONE SOD SUC (PF) 100 MG IJ SOLR
50.0000 mg | Freq: Two times a day (BID) | INTRAMUSCULAR | Status: DC
  Administered 2022-11-11 – 2022-11-13 (×4): 50 mg via INTRAVENOUS
  Filled 2022-11-11: qty 2
  Filled 2022-11-11: qty 1
  Filled 2022-11-11: qty 2

## 2022-11-11 MED ORDER — MIDAZOLAM HCL 2 MG/2ML IJ SOLN
2.0000 mg | Freq: Once | INTRAMUSCULAR | Status: AC
  Administered 2022-11-11: 2 mg via INTRAVENOUS

## 2022-11-11 MED ORDER — TRACE MINERALS CU-MN-SE-ZN 300-55-60-3000 MCG/ML IV SOLN
INTRAVENOUS | Status: AC
  Filled 2022-11-11: qty 819.2

## 2022-11-11 MED ORDER — VITAMIN K1 10 MG/ML IJ SOLN
5.0000 mg | Freq: Once | INTRAVENOUS | Status: AC
  Administered 2022-11-11: 5 mg via INTRAVENOUS
  Filled 2022-11-11: qty 0.5

## 2022-11-11 MED ORDER — SODIUM CHLORIDE 0.9 % IV SOLN
1.0000 g | Freq: Three times a day (TID) | INTRAVENOUS | Status: DC
  Administered 2022-11-11 – 2022-11-13 (×8): 1 g via INTRAVENOUS
  Filled 2022-11-11 (×8): qty 20

## 2022-11-11 MED ORDER — MIDAZOLAM HCL 2 MG/2ML IJ SOLN
INTRAMUSCULAR | Status: AC
  Filled 2022-11-11: qty 2

## 2022-11-11 MED ORDER — SODIUM CHLORIDE 0.9% IV SOLUTION: Freq: Once | INTRAVENOUS | Status: AC

## 2022-11-11 NOTE — Progress Notes (Signed)
NAME:  Brent Allison, MRN:  [id number], DOB:  06/03/1946, LOS: 12 ADMISSION DATE:  11/05/2022, CONSULTATION DATE:  11/11/22 REFERRING MD:  Dr. Alvino Chapel ED, CHIEF COMPLAINT: Abdominal pain  History of Present Illness:  76 year old with known history of below presents with acute onset of abdominal pain about 7:30 PM, peritoneal exam, found to have viscus perforation on CT of the abdomen.  Acute onset of pain.  CT scan revealed intestinal perforation.  He takes Eliquis for VTE history.  Last dose this morning.  Received Andexxa in the ED for reversal.  Surgery was consulted.  Taken to the OR.  They recommended medical ICU admission versus surgical admission given complex medical history.  Lab notable for mild elevation in creatinine, AKI, mild leukocytosis with left shift, hemoglobin appears at baseline.  Blood cultures were obtained.  Pertinent  Medical History  History of amyloidosis, history of diastolic dysfunction although euvolemic on exam, history of DVT  Significant Hospital Events: Including procedures, antibiotic start and stop dates in addition to other pertinent events   10/23/2022 presents with acute onset abdominal pain found to have abdominal perforation 10/31/22  - Surgery : POD 0 s/p Ex lap, transverse colectomy and placement of abthera wound vac for bowel perforation, feculent peritonitis, mesenteric ischemia by Dr. Redmond Pulling on 10/31/22. - Intra-Op Findings: significant stool contamination, old periumbilical mesh, omentum and colonic tissue very friable and essentially fell apart, silk sutures marking proximal transverse & distal transverse colon.  On Eliquis at baseline. Andexxa pre-op. T  11/21/2022 - surgery: POD 0 s/p ex-lap, creation of colostomy and abdominal closure with retention sutures by Dr. Grandville Silos; holding [facility]. Tolerating PS 5 over 5, extubate. 10/24/2022: Large perforation in the transverse colon just proximal to the colostomy site, with feculent peritonitis. S/p ex  lap, partial colectomy (proximal transverse) with discontinuity, application of wound VAC; noted arrest in preop. Started on meropenum and micafungin. Patient intubated.  11/09/22: Started on linezolid for MRSA coverage 11/19/2022: surgery: s/p ex-lap with abdominal washout, friable and fragile tissues, ~1L of old blood clot evacuated from abdomen, oozing omentum, application of Abthera wound VAC  Interim History / Subjective:  Patient remains intubated. Decreased pressor requirements.   Objective   Blood pressure 105/87, pulse 63, temperature (!) 97.4 F (36.3 C), temperature source Axillary, resp. rate (!) 28, height 6' (1.829 m), weight 108.6 kg, SpO2 99 %.    Vent Mode: PRVC FiO2 (%):  [40 %] 40 % Set Rate:  [28 bmp] 28 bmp Vt Set:  [580 mL] 580 mL PEEP:  [5 cmH20] 5 cmH20 Plateau Pressure:  [19 cmH20-23 cmH20] 19 cmH20   Intake/Output Summary (Last 24 hours) at 11/11/2022 0755 Last data filed at 11/11/2022 0600 Gross per 24 hour  Intake 5080.35 ml  Output 2450 ml  Net 2630.35 ml    Filed Weights   11/17/2022 0300 11/09/22 0500 10/31/2022 0500  Weight: 98.3 kg 99.1 kg 108.6 kg    Examination: General: ill-appearing male, orally intubated HEENT:  ETT in place Neuro: Sedated Pulmonary: mechanically ventilated breath sounds Heart: normal rate Abdomen: Soft, distended, wound VAC in place Skin: generalized edema   Resolved Hospital Problem list     Assessment & Plan:  Severe sepsis with septic shock in the setting of recurrent bowel perforation, feculent peritonitis S/p ex-lap with partial colectomy and wound VAC placement  Lactic acidosis 12/20 s/p ex lap with abdominal washout, noted to have ~1L of old blood clot coating visceral surface and oozing of omentum. MAP remain  above goal of 65. Still requiring norepinephrine. Blood cultures so far no growth x 3 days. Lactic acid down to 2.8 yesterday from 6.2 on 12/18.   -on norepinephrine; off epinephrine and vasopressin -MAP  goal 65 -decrease hydrocortisone q8 change to 50 mg q12 -on meropenem and micafungin (day 4) -stop linezolid  -f/u blood cultures   Acute hypoxic respiratory failure 2/2 above Patient was re intubated. Continue lung protective ventilation VAP prevention bundle in place PAD protocol with Precedex and fentanyl infusions, RASS goal -2  Mixed metabolic and respiratory acidosis Bicarb 22 today. Lactic acid trending down 2.8 yesterday from 6.2 on 12/18.   Acute blood loss anemia, perioperative Thrombocytopenia Hgb 8 today after 1 unit PRBC yesterday. Received FFP yesterday. Platelet 141>80>51>60. DIC panel showed elevated d-dimer, fibrinogen, PT and INR. No schistocytes on smear. Does not suggest acute DIC. Dr. [name] evaluated him today, continue supportive care. Will transfuse 2 units FFP and dose of vitamin K today. Anticipate return to OR tomorrow if platelet count improves >80.  -CBC -transfuse 2 units FFP -Vitamin K  -repeat INR at 1700  History of recurrent VTE Most recently on 01/2022. On indefinite anticoagulation with eliquis. Was on argatroban but stopped on 12/18. Holding anticoagulation in the setting of acute blood loss anemia and repeat surgery.  CKD 3A Serum creatinine is at baseline, monitor intake and output. Avoid nephrotoxic agents  Demand cardiac ischemia in the setting of recurrent bowel perforation Workup has been negative for ischemia  Prediabetes A1c 5.7% Continue sliding scale with CBG goal 140-180. CBG in low 200s, on resistant SSI. Decreasing steroid dose and frequency today. Pharmacy will adjust insulin in TPN.   Multiple Myeloma AL Amyloidosis MM diagnosed in 2012, AL amyloidosis diagnosed in 2013. Remote history of bone marrow transplant. Treatment being held since August 2023 due to low energy. Follows with Dr. [name]. Continue support of care  Pressure Injury Stage 1 injury of medial sacrum noted. Continue with wound care.    Best Practice  (right click and "Reselect all SmartList Selections" daily)   Diet/type: NPO DVT prophylaxis: SCD GI prophylaxis:  PPI  Lines: Central line and Arterial Line  Foley:  yes  Code Status:  full code Last date of multidisciplinary goals of care discussion [family updated at bedside today]

## 2022-11-11 NOTE — Progress Notes (Signed)
PHARMACY NOTE:  ANTIMICROBIAL RENAL DOSAGE ADJUSTMENT  Current antimicrobial regimen includes a mismatch between antimicrobial dosage and estimated renal function.  As per policy approved by the Pharmacy & Therapeutics and Medical Executive Committees, the antimicrobial dosage will be adjusted accordingly.  Current antimicrobial dosage:  Meropenem 1g IV Q12H   Indication: Abdominal perforation   Renal Function:  Estimated Creatinine Clearance: 60.1 mL/min (A) (by C-G formula based on SCr of 1.33 mg/dL (H)). '[]'$      On intermittent HD, scheduled: '[]'$      On CRRT    Antimicrobial dosage has been changed to:  Meropenem 1g IV Q8H   Additional comments:   Thank you for allowing pharmacy to be a part of this patient's care.  Adria Dill, PharmD PGY-2 Infectious Diseases Resident  11/11/2022 2:33 PM

## 2022-11-11 NOTE — Anesthesia Preprocedure Evaluation (Addendum)
Anesthesia Evaluation  Patient identified by MRN, date of birth, ID band Patient unresponsive    Reviewed: Patient's Chart, lab work & pertinent test results, Unable to perform ROS - Chart review only  Airway Mallampati: Intubated       Dental no notable dental hx.    Pulmonary sleep apnea and Continuous Positive Airway Pressure Ventilation , former smoker    + decreased breath sounds      Cardiovascular + Peripheral Vascular Disease and + DVT  negative cardio ROS Normal cardiovascular exam  ECHO 12/23:   1. Left ventricular ejection fraction, by estimation, is 50 to 55%. The  left ventricle has low normal function. The left ventricle has no regional  wall motion abnormalities. There is moderate asymmetric left ventricular  hypertrophy of the basal-septal  segment. Left ventricular diastolic parameters are indeterminate.   2. Right ventricular systolic function is normal. The right ventricular  size is normal. Tricuspid regurgitation signal is inadequate for assessing  PA pressure.   3. The mitral valve is normal in structure. Trivial mitral valve  regurgitation. No evidence of mitral stenosis.   4. The aortic valve was not well visualized. Aortic valve regurgitation  is not visualized. No aortic stenosis is present.      Neuro/Psych negative neurological ROS  negative psych ROS   GI/Hepatic Neg liver ROS,,,Open abdomen   Endo/Other  negative endocrine ROS    Renal/GU negative Renal ROS     Musculoskeletal  (+) Arthritis , Osteoarthritis,    Abdominal   Peds  Hematology  (+) Blood dyscrasia Lab Results      Component                Value               Date                      WBC                      7.6                 11/12/2022                HGB                      7.6 (L)             11/12/2022                HCT                      23.2 (L)            11/12/2022                MCV                       89.9                11/12/2022                PLT                      55 (L)              11/12/2022            Lab Results      Component  Value               Date                      INR                      1.4 (H)             11/12/2022                INR                      1.5 (H)             11/11/2022                INR                      1.9 (H)             11/20/2022                PROTIME                  14.4 (H)            03/13/2013                PROTIME                  25.2 (H)            01/23/2013                PROTIME                  21.6 (H)            01/02/2013             Lab Results      Component                Value               Date                      NA                       142                 11/12/2022                K                        4.3                 11/12/2022                CO2                      21 (L)              11/12/2022                GLUCOSE                  194 (H)             11/12/2022  BUN                      57 (H)              11/12/2022                CREATININE               1.31 (H)            11/12/2022                CALCIUM                  8.3 (L)             11/12/2022                EGFR                     55 (L)              01/29/2016                GFRNONAA                 56 (L)              11/12/2022              Anesthesia Other Findings   Reproductive/Obstetrics                             Anesthesia Physical Anesthesia Plan  ASA: 4  Anesthesia Plan: General   Post-op Pain Management:    Induction: Intravenous  PONV Risk Score and Plan: 2  Airway Management Planned: Oral ETT  Additional Equipment: Arterial line and CVP  Intra-op Plan:   Post-operative Plan: Post-operative intubation/ventilation  Informed Consent: I have reviewed the patients History and Physical, chart, labs and discussed the procedure including the risks,  benefits and alternatives for the proposed anesthesia with the patient or authorized representative who has indicated his/her understanding and acceptance.       Plan Discussed with:   Anesthesia Plan Comments:        Anesthesia Quick Evaluation

## 2022-11-11 NOTE — Progress Notes (Addendum)
PT Cancellation Note/ Discharge  Patient Details Name: TJ KITCHINGS MRN: [id number] DOB: 1946-09-19   Cancelled Treatment:    Reason Eval/Treat Not Completed: Medical issues which prohibited therapy (pt remains on vent post sx 12/20 and not medically appropriate) Will sign off and await new order   Nastasha Reising B Augustino Savastano 11/11/2022, 6:58 AM [place] Office: [phone]

## 2022-11-11 NOTE — Inpatient Diabetes Management (Signed)
Inpatient Diabetes Program Recommendations  AACE/ADA: New Consensus Statement on Inpatient Glycemic Control (  Target Ranges:  Prepandial:   less than 140 mg/dL      Peak postprandial:   less than 180 mg/dL (1-2 hours)      Critically ill patients:  140 - 180 mg/dL    Latest Reference Range & Units 11/06/2022 07:57 11/09/2022 11:21 11/07/2022 19:36 10/31/2022 23:19 11/11/22 03:09  Glucose-Capillary 70 - 99 mg/dL 207 (H) 229 (H) 157 (H) 191 (H) 200 (H)  (H): Data is abnormally high  Review of Glycemic Control  Current orders for Inpatient glycemic control: 0-20 units Q4H, TPN @ 80 ml/hr with 20 units of insulin  Inpatient Diabetes Program Recommendations:    Insulin: If TPN is continued at current rate, please consider increasing insulin in TPN to 35 units.  Thanks, Barnie Alderman, RN, MSN, [place] Diabetes Coordinator Inpatient Diabetes Program [phone] (Team Pager from 8am to [place])

## 2022-11-11 NOTE — Progress Notes (Signed)
OT Cancellation Note  Patient Details Name: Brent Allison MRN: [id number] DOB: 1946/06/25   Cancelled Treatment:    Reason Eval/Treat Not Completed: Patient not medically ready (Patien on vent, not medically appropriate at this time) Lodema Hong, [place]  Office [place] 11/11/2022, 7:04 AM

## 2022-11-11 NOTE — Progress Notes (Signed)
OT Cancellation Note  Patient Details Name: Brent Allison MRN: [id number] DOB: 1946-06-04   Cancelled Treatment:    Reason Eval/Treat Not Completed: Medical issues which prohibited therapy.  Patient remains on vent post sx 12/20 and not medically appropriate, will sign off and await new order.     Zaniya Mcaulay D Joelle Flessner 11/11/2022, 4:33 PM 11/11/2022  RP, OTR/L  Acute Rehabilitation Services  Office:  [phone]

## 2022-11-11 NOTE — Progress Notes (Signed)
PHARMACY - TOTAL PARENTERAL NUTRITION CONSULT NOTE   Indication: Prolonged ileus  Patient Measurements: Height: 6' (182.9 cm) Weight:  (Bed scale would not weigh) IBW/kg (Calculated) : 77.6 TPN AdjBW (KG): 96.9 Body mass index is 32.47 kg/m.  Assessment: 76 yo male admitted on 12/9 for abdominal pain found to have peritonitis due to transverse colon perforation possibly secondary to mesenteric ischemia. S/p ex lap, creating of colostomy and abdominal closure on 12/11. Patient was extubated on 12/12 and started on a clear liquid diet, however has had minimal PO intake since admission and unable to tolerate nutritional supplements due to severe nausea. Pharmacy has been consulted to dose TPN.   Glucose / Insulin: hx pre-DM, A1c 5.7%. CBGs running high 207-220 with 20 units regular insulin in TPN + sSSI use: 26u/24 hours. No DM meds PTA. Electrolytes: Na 140, K 4.6 Mg 2.4 Renal: Scr down 1.33, BUN 51 (BL Scr ~1.5-1.7) Hepatic: LFTs slightly elevated; Alb 1.7, Tbili 1.9, TG 113 Intake / Output: +20 L this admit; UOP 0.6 ml/kg/hr; NG 0 ML; wound vac 750 mL   GI Imaging: 12/9 CTAP: suspected pneumatosis, concern for mesenteric ischemia GI Surgeries / Procedures:  12/10: s/p Ex lap, transverse colectomy and placement of abthera wound vac for bowel perforation, feculent peritonitis, mesenteric ischemia 12/11: s/p ex lap, creation of colostomy and abdominal closure including retention sutures 12/18: s/p ex lap, re-perforation - partial colectomy, temporary closure   Central access: ordered to be placed 12/14 TPN start date: 12/14  Nutritional Goals: Goal TPN rate is 80 mL/hr (provides 123 g of protein and 2293 kcals per day meeting 100% of needs)  RD Assessment: Estimated Needs Total Energy Estimated Needs: 2200-2400 Total Protein Estimated Needs: 120-140 grams Total Fluid Estimated Needs: >2.0 L  Current Nutrition:  12/14 TPN>>  12/15 CLD - Boost Breeze TID as tolerated  (intermittently refusing d/t nausea) 12/16 advancing to full liquids - Boost x2 12/16 12/17 NPO   Plan:  Continue concentrated TPN to 67m/hr at 1800 (will provide 122 g AA and 2293 kCal meeting 100% of patient needs) Electrolytes in TPN: Na 8 mEq/L, K 25 mEq/L, Ca 5 mEq/L, Mg 7 mEq/L, and Phos 5 mmol/L. Max Acetate - no changes Add standard trace elements and MVI to TPN Continue Resistant q4h SSI and adjust as needed  Increase to 40 units insulin regular in TPN bag  Monitor TPN labs on Mon/Thurs  MBarth Kirks PharmD, BCCCP Clinical Pharmacist  Please check AMION for all [facility]  11/11/2022 9:40 AM

## 2022-11-11 NOTE — Progress Notes (Signed)
IP PROGRESS NOTE  Subjective:   Brent Allison is well-known to me with a history of a plasma cell dyscrasia with associated amyloidosis.  He is currently maintained off of systemic therapy. He was admitted 10/23/2022 with acute abdominal pain.  A CT revealed mild colonic wall thickening at the transverse colon with pneumatosis.  A focal perforation was noted in the distal transverse colon. He underwent exploratory laparotomy, transverse colectomy, and placement of a wound VAC on 10/31/2022.  He was found to have significant stool contamination.  The omentum and colonic tissue were friable.  He was taken back to the operating room on 11/17/2022 for exploratory laparotomy, colostomy, and abdominal closure with retention sutures. He was extubated 11/02/2022 He was placed on argatroban beginning 11/03/2022.  Brent Allison developed increased abdominal pain in the p.m. on 11/07/2022.  A CT revealed a probable perforation of the transverse colon proximal to the colostomy.  He was taken back to the operating room in the a.m. on 11/03/2022 and was found to have a large perforation of the transverse colon proximal to the colostomy site with feculent peritonitis.  The omentum appeared friable and ischemic.  He was taken back to the operating room on 11/19/2022 for and found to have friable omentum.  Approximate 1 L of "blood clot "was removed and the visceral surfaces.  There was oozing from the omentum and mesentery.  No evidence of perforation.  An abdominal washout was completed and another wound VAC was placed.  A sponge was left in site 2 over friable omentum.  He remains intubated in the ICU.  Pressor support is being weaned.  I am asked to see him secondary to the oozing and clot noted during the most recent surgery.  We are also asked to evaluate thrombocytopenia.  Argatroban was discontinued at 3 AM on 11/07/2022.  His brother is at the bedside this morning.  Objective: Vital signs in last 24  hours: Blood pressure 105/87, pulse 63, temperature 98.2 F (36.8 C), temperature source Rectal, resp. rate (!) 28, height 6' (1.829 m), weight 239 lb 6.7 oz (108.6 kg), SpO2 99 %.  Intake/Output from previous day: 12/20 0701 - 12/21 0700 In: 5080.4 [I.V.:2943.1; Blood:982; IV Piggyback:1155.3] Out: 2450 [Urine:1600; Drains:750; Blood:100]  Physical Exam:  HEENT: ET tube in place.  Small right conjunctival hemorrhage. Lungs: Clear anteriorly Cardiac: Distant heart sounds Abdomen: Wound VAC in place, blood-tinged drainage from the wound VAC Extremities: Mild pitting edema over the extremities IV sites without bleeding, ecchymoses over the extremities   Lab Results: Recent Labs    11/19/2022 0500 11/02/2022 1637 11/11/22 0403  WBC 13.5*  --  9.5  HGB 7.8*  --  8.0*  HCT 23.6*  --  23.1*  PLT 80* 51* 60*    BMET Recent Labs    10/29/2022 0500 11/11/22 0403  NA 140 140  K 5.2* 4.6  CL 112* 111  CO2 20* 22  GLUCOSE 224* 219*  BUN 49* 51*  CREATININE 1.54* 1.33*  CALCIUM 7.5* 8.1*    No results found for: "CEA1", "CEA", "AYT016", "CA125"  Studies/Results: No results found.  Medications: I have reviewed the patient's current medications.  Assessment/Plan: Amyloid involving an eyelid biopsy 06/10/2011 "Bruising" at the eyelids and mouth: Likely related to amyloidosis, persistent. Numbness and loss of vibratory sense at the fingertips: This predated Velcade-based therapy, but worsened. Now much improved following cervical spine surgery. Elevated serum free lambda light chains. The lambda light chains were lower on November 16 and slightly  higher on 12/14/2011. Lambda light chain proteinuria. Bone marrow plasmacytosis: Variable increase in plasma cells noted on the bone marrow biopsy 07/29/2011 with plasma cells estimated to represent between 4% and 20% of the cellular population. Remote history of prostate cancer. Sleep apnea. Dyslipidemia. Report of pneumonia on 2  occasions in 2011. Admission to a hospital in [place], [place] October 2012 with "pneumonia." A chest x-[facility] at [facility] on November 2 was negative .   Low serum immunoglobulin G level. Plasma cell dyscrasia with associated amyloidosis.   Initiation of systemic therapy with Cytoxan, Velcade, and Decadron 08/12/2011. Cycle #2 was initiated on 09/16/2011. Cycle #3 was initiated on 10/15/2011. Velcade was placed on hold due to neuropathy. The serum free lambda light chains were decreased on October 08 2011. The serum free lambda light chains were slightly increased on 12/14/2011, lower on 12/29/2011 and 02/02/2012. Initiation of Revlimid/Decadron February 2013, cycle 2 started on 01/28/2012. High-dose melphalan chemotherapy followed by autologous stem cell infusion on 02/09/2013 Initiation of Velcade/Decadrontherapy 05/28/2013, he completed 2 cycles on a day 1, 4, 8, 11 schedule Improvement in the serum free lambda light chains following induction Velcade/Decadron   Initiation of weekly Velcade/Decadron on 07/16/2013, last treatment on 10/16/2013 Improvement in the serum free lambda light chains on 08/21/2013 and 09/18/2013 [facility]) Normal serum free lambda light chains on 12/13/2013 Mild increase of the serum free lambda light chains beginning January 2016-stable  treatment on the Pronto study at [facility] beginning 12/17/2015, in  of study 11/23/2016 Started on phase 2b Prothena study-long-term safety/efficacy study 01/11/2017, discontinued May 2018  Doxycycline started 05/17/2017, changed to once daily August 2022 01/25/2022 bone marrow biopsy-hypercellular marrow with plasma cell neoplasm, 7%, plasma cells are predominantly small in size with occasional binucleation noted, no lymphocytosis, lambda predominant; 13q-/-13 detected, 1p32/1q21 detected, CCND1(BCL1)/IgH t(11;14) detected Daratumumab/Decadron initiated 01/28/2022 Serum free light chains lower 04/02/2022 Serum free light chains stable  06/24/2022 Last daratumumab 07/22/2022 13.  Soft tissue infection/cellulitis right second finger 03/12/2022-Augmentin 14.  04/16/2022 right peroneal vein DVT at the level of the mid calf, favored acute by radiologist; Eliquis initiated 15.  Admission 11/17/2022 with acute abdominal pain/perforated viscus 10/31/2022-exploratory laparotomy, transverse colectomy, placement of wound VAC-feculent peritonitis, mesenteric ischemia 11/05/2022-exploratory laparotomy, creation of colostomy, abdominal closure with retention sutures 11/09/2022-increased abdominal pain, exploratory laparotomy found a large perforation in the transverse colon proximal to the colostomy site with feculent peritonitis, partial transverse colectomy, wound VAC 11/20/2022-exploratory laparotomy with abdominal washout, friable tissue noted, 1 L of old clot evacuated from the abdomen, and oozing from the omentum and mesentery, application of wound VAC 16.  Anemia/thrombocytopenia secondary to sepsis syndrome, low-grade DIC?,  Bleeding, surgical blood loss 17.  Coagulopathy secondary to sepsis, malnutrition, and polypharmacy      Brent Allison was admitted on 11/07/2022 with an acute abdomen.  He was found to have a perforated transverse colon.  He went back to the operating room on 11/07/2022 with another perforation of the transverse colon proximal to the colostomy site.  He had feculent peritonitis on both occasions.  When he was taken to the operating room yesterday he was noted to have clots/blood in the abdominal cavity, friable tissues, and oozing.  The bleeding is likely related to a coagulopathy in the setting of sepsis, malnutrition, argatroban could have contributed to bleeding following the procedure on 11/12/2022.  The low platelet count is likely secondary to bone marrow suppression from sepsis.  The PT is improved today.  The platelet count remains moderately decreased.  These  parameters should continue to improve with resolution of  sepsis and vitamin K.  It is unclear whether the amyloidosis contributed to the bowel perforation or bleeding.  Patients with myeloma and amyloidosis can developed factor inhibitors leading to a coagulopathy, but he does not have this history.  He does not have a history of GI symptoms related amyloidosis.  We can ask pathology to look for evidence of amyloid in the bowel resection specimen.  I discussed the case with his daughter yesterday and brother today.  Recommendations: Continue management of sepsis per the critical care service Management of the abdominal wound per the surgical service Hold anticoagulation Repeat PT/INR and platelet count 11/12/2022 Transfuse platelets for a count of less than 20,000 or bleeding and as needed for surgery I will check on him 11/12/2022 Please call hematology as needed        LOS: 12 days   Betsy Coder, MD   11/11/2022, 7:12 AM

## 2022-11-11 NOTE — Progress Notes (Signed)
[facility] Progress Note  1 Day Post-Op  Subjective: CC-  Brother at bedside. Sedated on the vent.  On stable pressor requirements, Levo 2  Objective: Vital signs in last 24 hours: Temp:  [96.8 F (36 C)-100 F (37.8 C)] 97.5 F (36.4 C) (12/21 1115) Pulse Rate:  [60-89] 60 (12/21 0930) Resp:  [12-29] 12 (12/21 0930) BP: (105)/(87) 105/87 (12/20 1620) SpO2:  [95 %-100 %] 100 % (12/21 0930) Arterial Line BP: (89-158)/(5-66) 109/65 (12/21 0930) FiO2 (%):  [40 %] 40 % (12/21 0811) Last BM Date : 11/06/22  Intake/Output from previous day: 12/20 0701 - 12/21 0700 In: 5080.4 [I.V.:2943.1; Blood:982; IV Piggyback:1155.3] Out: 2952 [Urine:1600; Drains:750; Blood:100] Intake/Output this shift: Total I/O In: 993.3 [I.V.:592.9; IV Piggyback:400.3] Out: 530 [Urine:400; Drains:130]  PE: Gen: Sedated on the vent CV: RRR Pulm: mechanically ventilated Abd: Soft, VAC in place with good seal (130cc serosanguinous drainage so far this morning)  Lab Results:  Recent Labs    11/05/2022 0500 11/03/2022 1637 11/11/22 0403  WBC 13.5*  --  9.5  HGB 7.8*  --  8.0*  HCT 23.6*  --  23.1*  PLT 80* 51* 60*   BMET Recent Labs    11/12/2022 0500 11/11/22 0403  NA 140 140  K 5.2* 4.6  CL 112* 111  CO2 20* 22  GLUCOSE 224* 219*  BUN 49* 51*  CREATININE 1.54* 1.33*  CALCIUM 7.5* 8.1*   PT/INR Recent Labs    10/22/2022 1002 11/07/2022 1637  LABPROT 26.5* 21.6*  INR 2.5* 1.9*   CMP     Component Value Date/Time   NA 140 11/11/2022 0403   NA 140 01/29/2016 0831   K 4.6 11/11/2022 0403   K 4.4 01/29/2016 0831   CL 111 11/11/2022 0403   CL 106 03/14/2013 1304   CO2 22 11/11/2022 0403   CO2 24 01/29/2016 0831   GLUCOSE 219 (H) 11/11/2022 0403   GLUCOSE 105 01/29/2016 0831   GLUCOSE 94 03/14/2013 1304   BUN 51 (H) 11/11/2022 0403   BUN 21.5 01/29/2016 0831   CREATININE 1.33 (H) 11/11/2022 0403   CREATININE 1.54 (H) 10/21/2022 1433   CREATININE 1.3 01/29/2016 0831    CALCIUM 8.1 (L) 11/11/2022 0403   CALCIUM 9.1 01/29/2016 0831   PROT 4.6 (L) 11/11/2022 0403   PROT 6.1 01/29/2016 0831   PROT 6.5 01/29/2016 0831   ALBUMIN 2.2 (L) 11/11/2022 0403   ALBUMIN 3.5 01/29/2016 0831   AST 83 (H) 11/11/2022 0403   AST 13 (L) 10/21/2022 1433   AST 16 01/29/2016 0831   ALT 48 (H) 11/11/2022 0403   ALT 9 10/21/2022 1433   ALT 10 01/29/2016 0831   ALKPHOS 85 11/11/2022 0403   ALKPHOS 92 01/29/2016 0831   BILITOT 1.9 (H) 11/11/2022 0403   BILITOT 0.4 10/21/2022 1433   BILITOT 0.41 01/29/2016 0831   GFRNONAA 55 (L) 11/11/2022 0403   GFRNONAA 46 (L) 10/21/2022 1433   GFRAA >60 04/24/2019 0906   Lipase     Component Value Date/Time   LIPASE 10 (L) 10/28/2012 2253       Studies/Results: No results found.  Anti-infectives: Anti-infectives (From admission, onward)    Start     Dose/Rate Route Frequency Ordered Stop   11/09/22 1800  meropenem (MERREM) 1 g in sodium chloride 0.9 % 100 mL IVPB        1 g 200 mL/hr over 30 Minutes Intravenous Every 12 hours 11/09/22 1103     11/09/22 1245  linezolid (ZYVOX) IVPB 600 mg  Status:  Discontinued        600 mg 300 mL/hr over 60 Minutes Intravenous Every 12 hours 11/09/22 1146 11/11/22 1047   10/23/2022 1500  meropenem (MERREM) 1 g in sodium chloride 0.9 % 100 mL IVPB  Status:  Discontinued        1 g 200 mL/hr over 30 Minutes Intravenous Every 8 hours 10/29/2022 1410 11/09/22 1103   11/16/2022 0900  micafungin (MYCAMINE) 100 mg in sodium chloride 0.9 % 100 mL IVPB        100 mg 105 mL/hr over 1 Hours Intravenous Every 24 hours 10/24/2022 0803     11/20/2022 0200  piperacillin-tazobactam (ZOSYN) IVPB 3.375 g  Status:  Discontinued        3.375 g 12.5 mL/hr over 240 Minutes Intravenous Every 8 hours 11/18/2022 0119 10/25/2022 1410   11/02/22 1200  piperacillin-tazobactam (ZOSYN) IVPB 3.375 g        3.375 g 12.5 mL/hr over 240 Minutes Intravenous Every 8 hours 11/02/22 0942 11/06/22 0120   10/31/22 0400   piperacillin-tazobactam (ZOSYN) IVPB 3.375 g  Status:  Discontinued        3.375 g 12.5 mL/hr over 240 Minutes Intravenous Every 8 hours 11/09/2022 2330 11/02/22 0942   11/07/2022 2130  piperacillin-tazobactam (ZOSYN) IVPB 3.375 g        3.375 g 100 mL/hr over 30 Minutes Intravenous  Once 11/06/2022 2120 10/29/2022 2221        Assessment/Plan -POD #11 s/p Ex lap, transverse colectomy and placement of abthera wound vac for bowel perforation, feculent peritonitis, mesenteric ischemia by Dr. Redmond Pulling on 10/31/22 -POD #10 s/p ex lap, creation of colostomy and abdominal closure including retention sutures on 12/11 by Dr. Grandville Silos -POD #3 s/p ex lap, partial colectomy (proximal transverse) with discontinuity, application of Abthera [facility] 12/18 Dr. Zenia Resides; noted arrest in preop -POD#1 s/p Ex lap abdominal washout, Abthera placement 12/20 Dr. Dema Severin - FINDINGS 12/18: Large perforation in the transverse colon just proximal to the colostomy site, with feculent peritonitis.  - FINDINGS 12/20: Extremely friable and fragile tissues, ~1L old blood clot  - Plan to return to OR 12/22 pending improved coags. Discussed with CCM and appreciate their assistance. Would like INR <1.5 and platelets >80 prior to OR. Giving FFP and vitamin K this AM, plan to recheck labs later today and in the morning. Transfuse PRN - Abthera VAC to suction - continue TPN - IV abx, broad spectrum    FEN - TPN; NG tube with strict NPO given he is in discontinuity  VTE - SCDs, On Eliquis at baseline. Argatroban gtt prior (hx HIT) ID - merrem and linezolid   - Per CCM -  AKI on CKD3A  Amyloidosis  History of recurrent VTE: Most recently DVT in 01/2022 - On Eliquis at baseline. Argatroban gtt (hx HIT) now held NSTEMI    LOS: 12 days    Wellington [place], [facility] 11/11/2022, 11:39 AM Please see Amion for pager number during day hours 7:00am-4:30pm

## 2022-11-12 ENCOUNTER — Encounter (HOSPITAL_COMMUNITY): Admission: EM | Disposition: E | Payer: Self-pay | Source: Home / Self Care | Attending: Pulmonary Disease

## 2022-11-12 ENCOUNTER — Inpatient Hospital Stay (HOSPITAL_COMMUNITY): Payer: Medicare Other | Admitting: Registered Nurse

## 2022-11-12 ENCOUNTER — Other Ambulatory Visit: Payer: Self-pay

## 2022-11-12 DIAGNOSIS — Z8719 Personal history of other diseases of the digestive system: Secondary | ICD-10-CM

## 2022-11-12 DIAGNOSIS — J9601 Acute respiratory failure with hypoxia: Secondary | ICD-10-CM | POA: Diagnosis not present

## 2022-11-12 DIAGNOSIS — R198 Other specified symptoms and signs involving the digestive system and abdomen: Secondary | ICD-10-CM | POA: Diagnosis not present

## 2022-11-12 DIAGNOSIS — S31109A Unspecified open wound of abdominal wall, unspecified quadrant without penetration into peritoneal cavity, initial encounter: Secondary | ICD-10-CM | POA: Diagnosis not present

## 2022-11-12 DIAGNOSIS — G4733 Obstructive sleep apnea (adult) (pediatric): Secondary | ICD-10-CM

## 2022-11-12 DIAGNOSIS — Z9989 Dependence on other enabling machines and devices: Secondary | ICD-10-CM

## 2022-11-12 HISTORY — PX: APPLICATION OF WOUND VAC: SHX5189

## 2022-11-12 HISTORY — PX: LAPAROTOMY: SHX154

## 2022-11-12 LAB — BPAM RBC
Blood Product Expiration Date: 202401112359
Blood Product Expiration Date: 202401152359
Blood Product Expiration Date: 202401172359
Blood Product Expiration Date: 202401172359
Blood Product Expiration Date: 202401172359
ISSUE DATE / TIME: 202312201012
Unit Type and Rh: 600
Unit Type and Rh: 6200
Unit Type and Rh: 6200
Unit Type and Rh: 6200
Unit Type and Rh: 6200

## 2022-11-12 LAB — COMPREHENSIVE METABOLIC PANEL
ALT: 55 U/L — ABNORMAL HIGH (ref 0–44)
AST: 78 U/L — ABNORMAL HIGH (ref 15–41)
Albumin: 1.9 g/dL — ABNORMAL LOW (ref 3.5–5.0)
Alkaline Phosphatase: 108 U/L (ref 38–126)
Anion gap: 10 (ref 5–15)
BUN: 57 mg/dL — ABNORMAL HIGH (ref 8–23)
CO2: 21 mmol/L — ABNORMAL LOW (ref 22–32)
Calcium: 8.3 mg/dL — ABNORMAL LOW (ref 8.9–10.3)
Chloride: 111 mmol/L (ref 98–111)
Creatinine, Ser: 1.31 mg/dL — ABNORMAL HIGH (ref 0.61–1.24)
GFR, Estimated: 56 mL/min — ABNORMAL LOW (ref >60)
Glucose, Bld: 194 mg/dL — ABNORMAL HIGH (ref 70–99)
Potassium: 4.3 mmol/L (ref 3.5–5.1)
Sodium: 142 mmol/L (ref 135–145)
Total Bilirubin: 1.4 mg/dL — ABNORMAL HIGH (ref 0.3–1.2)
Total Protein: 4.6 g/dL — ABNORMAL LOW (ref 6.5–8.1)

## 2022-11-12 LAB — BPAM FFP
Blood Product Expiration Date: 202312252359
Blood Product Expiration Date: 202312262359
ISSUE DATE / TIME: 202312211229
ISSUE DATE / TIME: 202312211229
Unit Type and Rh: 6200
Unit Type and Rh: 6200

## 2022-11-12 LAB — TYPE AND SCREEN
ABO/RH(D): A POS
Antibody Screen: POSITIVE
DAT, IgG: POSITIVE
Unit division: 0
Unit division: 0
Unit division: 0
Unit division: 0
Unit division: 0

## 2022-11-12 LAB — CBC
HCT: 23.2 % — ABNORMAL LOW (ref 39.0–52.0)
HCT: 23.4 % — ABNORMAL LOW (ref 39.0–52.0)
Hemoglobin: 7.6 g/dL — ABNORMAL LOW (ref 13.0–17.0)
Hemoglobin: 7.7 g/dL — ABNORMAL LOW (ref 13.0–17.0)
MCH: 29.5 pg (ref 26.0–34.0)
MCH: 30.3 pg (ref 26.0–34.0)
MCHC: 32.8 g/dL (ref 30.0–36.0)
MCHC: 32.9 g/dL (ref 30.0–36.0)
MCV: 89.9 fL (ref 80.0–100.0)
MCV: 92.1 fL (ref 80.0–100.0)
Platelets: 55 10*3/uL — ABNORMAL LOW (ref 150–400)
Platelets: 102 10*3/uL — ABNORMAL LOW (ref 150–400)
RBC: 2.58 MIL/uL — ABNORMAL LOW (ref 4.22–5.81)
RBC: 2.54 MIL/uL — ABNORMAL LOW (ref 4.22–5.81)
RDW: 18.8 % — ABNORMAL HIGH (ref 11.5–15.5)
RDW: 18.6 % — ABNORMAL HIGH (ref 11.5–15.5)
WBC: 7.6 10*3/uL (ref 4.0–10.5)
WBC: 13.2 10*3/uL — ABNORMAL HIGH (ref 4.0–10.5)
nRBC: 3.3 % — ABNORMAL HIGH (ref 0.0–0.2)
nRBC: 12.3 % — ABNORMAL HIGH (ref 0.0–0.2)

## 2022-11-12 LAB — PREPARE FRESH FROZEN PLASMA: Unit division: 0

## 2022-11-12 LAB — PROTIME-INR
INR: 1.4 — ABNORMAL HIGH (ref 0.8–1.2)
INR: 1.6 — ABNORMAL HIGH (ref 0.8–1.2)
Prothrombin Time: 17.4 seconds — ABNORMAL HIGH (ref 11.4–15.2)
Prothrombin Time: 19.3 seconds — ABNORMAL HIGH (ref 11.4–15.2)

## 2022-11-12 LAB — GLUCOSE, CAPILLARY
Glucose-Capillary: 165 mg/dL — ABNORMAL HIGH (ref 70–99)
Glucose-Capillary: 169 mg/dL — ABNORMAL HIGH (ref 70–99)
Glucose-Capillary: 164 mg/dL — ABNORMAL HIGH (ref 70–99)
Glucose-Capillary: 165 mg/dL — ABNORMAL HIGH (ref 70–99)
Glucose-Capillary: 196 mg/dL — ABNORMAL HIGH (ref 70–99)

## 2022-11-12 SURGERY — LAPAROTOMY, EXPLORATORY
Anesthesia: General

## 2022-11-12 MED ORDER — HYDRALAZINE HCL 20 MG/ML IJ SOLN: 20.0000 mg | Freq: Once | INTRAMUSCULAR | Status: DC

## 2022-11-12 MED ORDER — ALBUMIN HUMAN 5 % IV SOLN: INTRAVENOUS | Status: DC | PRN

## 2022-11-12 MED ORDER — EYE WASH OP SOLN
2.0000 [drp] | OPHTHALMIC | Status: DC | PRN
  Administered 2022-11-12: 2 [drp] via OPHTHALMIC
  Filled 2022-11-12: qty 118

## 2022-11-12 MED ORDER — SODIUM CHLORIDE 0.9% IV SOLUTION: Freq: Once | INTRAVENOUS | Status: DC

## 2022-11-12 MED ORDER — VITAMIN K1 10 MG/ML IJ SOLN
5.0000 mg | Freq: Once | INTRAVENOUS | Status: AC
  Administered 2022-11-12: 5 mg via INTRAVENOUS
  Filled 2022-11-12: qty 0.5

## 2022-11-12 MED ORDER — NOREPINEPHRINE 4 MG/250ML-% IV SOLN
0.0000 ug/min | INTRAVENOUS | Status: DC
  Administered 2022-11-12: 2 ug/min via INTRAVENOUS
  Administered 2022-11-13: 3 ug/min via INTRAVENOUS
  Filled 2022-11-12 (×3): qty 250

## 2022-11-12 MED ORDER — TRACE MINERALS CU-MN-SE-ZN 300-55-60-3000 MCG/ML IV SOLN
INTRAVENOUS | Status: AC
  Filled 2022-11-12: qty 819.2

## 2022-11-12 MED ORDER — HYDRALAZINE HCL 20 MG/ML IJ SOLN
10.0000 mg | Freq: Once | INTRAMUSCULAR | Status: AC
  Administered 2022-11-12: 10 mg via INTRAVENOUS

## 2022-11-12 MED ORDER — ROCURONIUM BROMIDE 10 MG/ML (PF) SYRINGE
PREFILLED_SYRINGE | INTRAVENOUS | Status: DC | PRN
  Administered 2022-11-12: 40 mg via INTRAVENOUS
  Administered 2022-11-12 (×2): 30 mg via INTRAVENOUS

## 2022-11-12 MED ORDER — HYDRALAZINE HCL 20 MG/ML IJ SOLN
INTRAMUSCULAR | Status: AC
  Filled 2022-11-12: qty 1

## 2022-11-12 MED ORDER — LACTATED RINGERS IV SOLN: INTRAVENOUS | Status: DC | PRN

## 2022-11-12 MED ORDER — PROPOFOL 10 MG/ML IV BOLUS
INTRAVENOUS | Status: DC | PRN
  Administered 2022-11-12: 50 mg via INTRAVENOUS

## 2022-11-12 MED ORDER — PROPOFOL 10 MG/ML IV BOLUS
INTRAVENOUS | Status: AC
  Filled 2022-11-12: qty 20

## 2022-11-12 MED ORDER — 0.9 % SODIUM CHLORIDE (POUR BTL) OPTIME
TOPICAL | Status: DC | PRN
  Administered 2022-11-12: 1000 mL
  Administered 2022-11-12: 2000 mL

## 2022-11-12 MED ORDER — SODIUM CHLORIDE 0.9% IV SOLUTION: Freq: Once | INTRAVENOUS | Status: AC

## 2022-11-12 MED ORDER — PHENYLEPHRINE 80 MCG/ML (10ML) SYRINGE FOR IV PUSH (FOR BLOOD PRESSURE SUPPORT)
PREFILLED_SYRINGE | INTRAVENOUS | Status: DC | PRN
  Administered 2022-11-12 (×3): 160 ug via INTRAVENOUS

## 2022-11-12 SURGICAL SUPPLY — 49 items
APL PRP STRL LF DISP 70% ISPRP (MISCELLANEOUS) ×2
APL SKNCLS STERI-STRIP NONHPOA (GAUZE/BANDAGES/DRESSINGS) ×4
BAG COUNTER SPONGE SURGICOUNT (BAG) ×2 IMPLANT
BAG SPNG CNTER NS LX DISP (BAG) ×2
BENZOIN TINCTURE PRP APPL 2/3 (GAUZE/BANDAGES/DRESSINGS) IMPLANT
BLADE CLIPPER SURG (BLADE) IMPLANT
CANISTER SUCT 3000ML PPV (MISCELLANEOUS) ×2 IMPLANT
CANISTER WOUNDNEG PRESSURE 500 (CANNISTER) IMPLANT
CHLORAPREP W/TINT 26 (MISCELLANEOUS) ×2 IMPLANT
COVER SURGICAL LIGHT HANDLE (MISCELLANEOUS) ×2 IMPLANT
DRAPE LAPAROSCOPIC ABDOMINAL (DRAPES) ×2 IMPLANT
DRAPE WARM FLUID 44X44 (DRAPES) ×2 IMPLANT
DRESSING PEEL AND PLAC PRVNA20 (GAUZE/BANDAGES/DRESSINGS) IMPLANT
DRSG PEEL AND PLACE PREVENA 20 (GAUZE/BANDAGES/DRESSINGS) ×2
ELECT BLADE 6.5 EXT (BLADE) IMPLANT
ELECT REM PT RETURN 9FT ADLT (ELECTROSURGICAL) ×2
ELECTRODE REM PT RTRN 9FT ADLT (ELECTROSURGICAL) ×2 IMPLANT
GLOVE BIO SURGEON STRL SZ7.5 (GLOVE) ×2 IMPLANT
GLOVE INDICATOR 8.0 STRL GRN (GLOVE) ×2 IMPLANT
GOWN STRL REUS W/ TWL LRG LVL3 (GOWN DISPOSABLE) ×2 IMPLANT
GOWN STRL REUS W/ TWL XL LVL3 (GOWN DISPOSABLE) ×2 IMPLANT
GOWN STRL REUS W/TWL LRG LVL3 (GOWN DISPOSABLE) ×2
GOWN STRL REUS W/TWL XL LVL3 (GOWN DISPOSABLE) ×2
HANDLE SUCTION POOLE (INSTRUMENTS) ×2 IMPLANT
KIT BASIN OR (CUSTOM PROCEDURE TRAY) ×2 IMPLANT
KIT OSTOMY DRAINABLE 2.75 STR (WOUND CARE) IMPLANT
KIT TURNOVER KIT B (KITS) ×2 IMPLANT
LIGASURE IMPACT 36 18CM CVD LR (INSTRUMENTS) IMPLANT
NS IRRIG 1000ML POUR BTL (IV SOLUTION) ×4 IMPLANT
PACK GENERAL/GYN (CUSTOM PROCEDURE TRAY) ×2 IMPLANT
PAD ARMBOARD 7.5X6 YLW CONV (MISCELLANEOUS) ×2 IMPLANT
PENCIL SMOKE EVACUATOR (MISCELLANEOUS) ×2 IMPLANT
SPECIMEN JAR LARGE (MISCELLANEOUS) IMPLANT
SPONGE T-LAP 18X18 ~~LOC~~+RFID (SPONGE) IMPLANT
STAPLER PROXIMATE 75MM BLUE (STAPLE) IMPLANT
STAPLER VISISTAT 35W (STAPLE) ×2 IMPLANT
SUCTION POOLE HANDLE (INSTRUMENTS) ×2
SUT NOVA NAB GS-21 0 18 T12 DT (SUTURE) IMPLANT
SUT PDS AB 1 CT1 36 (SUTURE) IMPLANT
SUT PDS AB 1 TP1 96 (SUTURE) ×4 IMPLANT
SUT SILK 2 0 SH CR/8 (SUTURE) ×2 IMPLANT
SUT SILK 2 0 TIES 10X30 (SUTURE) ×2 IMPLANT
SUT SILK 3 0 SH CR/8 (SUTURE) ×2 IMPLANT
SUT SILK 3 0 TIES 10X30 (SUTURE) ×2 IMPLANT
SUT VIC AB 3-0 SH 18 (SUTURE) IMPLANT
TOWEL GREEN STERILE (TOWEL DISPOSABLE) ×2 IMPLANT
TRAY FOLEY MTR SLVR 16FR STAT (SET/KITS/TRAYS/PACK) ×2 IMPLANT
WATER STERILE IRR 1000ML POUR (IV SOLUTION) IMPLANT
YANKAUER SUCT BULB TIP NO VENT (SUCTIONS) IMPLANT

## 2022-11-12 NOTE — Anesthesia Postprocedure Evaluation (Signed)
Anesthesia Post Note  Patient: Brent Allison  Procedure(s) Performed: EXPLORATORY LAPAROTOMY, ABDOMINAL WASHOUT, HEMICOLECTOMY, OSTOMY CREATION APPLICATION OF WOUND VAC     Patient location during evaluation: SICU Anesthesia Type: General Level of consciousness: sedated Pain management: pain level controlled Vital Signs Assessment: post-procedure vital signs reviewed and stable Respiratory status: patient remains intubated per anesthesia plan Cardiovascular status: stable Postop Assessment: no apparent nausea or vomiting Anesthetic complications: no   No notable events documented.  Last Vitals:  Vitals:   11/12/22 1241 11/12/22 1245  BP:  (!) 141/61  Pulse: 78 82  Resp: (!) 28 (!) 28  Temp: (!) 36.2 C (!) 36.3 C  SpO2: 94% 95%    Last Pain:  Vitals:   11/12/22 1056  TempSrc: Axillary  PainSc:                  March Rummage Yani Lal

## 2022-11-12 NOTE — Progress Notes (Signed)
[facility] Progress Note  Day of Surgery  Subjective: CC-  Daughter Aslhey at bedside. Sedated on the vent.   Objective: Vital signs in last 24 hours: Temp:  [97.3 F (36.3 C)-100.3 F (37.9 C)] 97.3 F (36.3 C) (12/22 0348) Pulse Rate:  [56-92] 57 (12/22 0600) Resp:  [12-29] 28 (12/22 0600) SpO2:  [98 %-100 %] 100 % (12/22 0600) Arterial Line BP: (96-207)/(45-79) 115/47 (12/22 0600) FiO2 (%):  [40 %] 40 % (12/22 0149) Weight:  [108 kg-112 kg] 108 kg (12/22 0354) Last BM Date : 11/06/22  Intake/Output from previous day: 12/21 0701 - 12/22 0700 In: 4514.6 [I.V.:3057.7; Blood:601.7; IV Piggyback:855.2] Out: 2190 [Urine:1550; Drains:640] Intake/Output this shift: No intake/output data recorded.  PE: Gen: Sedated on the vent CV: RRR Pulm: mechanically ventilated Abd: Soft, VAC in place with good seal (130cc serosanguinous drainage so far this morning)  Lab Results:  Recent Labs    11/11/22 2013 11/12/22 0355  WBC 8.2 7.6  HGB 7.7* 7.6*  HCT 22.8* 23.2*  PLT 54* 55*   BMET Recent Labs    11/11/22 0403 11/12/22 0355  NA 140 142  K 4.6 4.3  CL 111 111  CO2 22 21*  GLUCOSE 219* 194*  BUN 51* 57*  CREATININE 1.33* 1.31*  CALCIUM 8.1* 8.3*   PT/INR Recent Labs    11/11/22 1709 11/12/22 0355  LABPROT 17.7* 17.4*  INR 1.5* 1.4*   CMP     Component Value Date/Time   NA 142 11/12/2022 0355   NA 140 01/29/2016 0831   K 4.3 11/12/2022 0355   K 4.4 01/29/2016 0831   CL 111 11/12/2022 0355   CL 106 03/14/2013 1304   CO2 21 (L) 11/12/2022 0355   CO2 24 01/29/2016 0831   GLUCOSE 194 (H) 11/12/2022 0355   GLUCOSE 105 01/29/2016 0831   GLUCOSE 94 03/14/2013 1304   BUN 57 (H) 11/12/2022 0355   BUN 21.5 01/29/2016 0831   CREATININE 1.31 (H) 11/12/2022 0355   CREATININE 1.54 (H) 10/21/2022 1433   CREATININE 1.3 01/29/2016 0831   CALCIUM 8.3 (L) 11/12/2022 0355   CALCIUM 9.1 01/29/2016 0831   PROT 4.6 (L) 11/12/2022 0355   PROT 6.1  01/29/2016 0831   PROT 6.5 01/29/2016 0831   ALBUMIN 1.9 (L) 11/12/2022 0355   ALBUMIN 3.5 01/29/2016 0831   AST 78 (H) 11/12/2022 0355   AST 13 (L) 10/21/2022 1433   AST 16 01/29/2016 0831   ALT 55 (H) 11/12/2022 0355   ALT 9 10/21/2022 1433   ALT 10 01/29/2016 0831   ALKPHOS 108 11/12/2022 0355   ALKPHOS 92 01/29/2016 0831   BILITOT 1.4 (H) 11/12/2022 0355   BILITOT 0.4 10/21/2022 1433   BILITOT 0.41 01/29/2016 0831   GFRNONAA 56 (L) 11/12/2022 0355   GFRNONAA 46 (L) 10/21/2022 1433   GFRAA >60 04/24/2019 0906   Lipase     Component Value Date/Time   LIPASE 10 (L) 10/28/2012 2253       Studies/Results: No results found.  Anti-infectives: Anti-infectives (From admission, onward)    Start     Dose/Rate Route Frequency Ordered Stop   11/11/22 1530  meropenem (MERREM) 1 g in sodium chloride 0.9 % 100 mL IVPB        1 g 200 mL/hr over 30 Minutes Intravenous Every 8 hours 11/11/22 1431     11/09/22 1800  meropenem (MERREM) 1 g in sodium chloride 0.9 % 100 mL IVPB  Status:  Discontinued  1 g 200 mL/hr over 30 Minutes Intravenous Every 12 hours 11/09/22 1103 11/11/22 1431   11/09/22 1245  linezolid (ZYVOX) IVPB 600 mg  Status:  Discontinued        600 mg 300 mL/hr over 60 Minutes Intravenous Every 12 hours 11/09/22 1146 11/11/22 1047   11/12/2022 1500  meropenem (MERREM) 1 g in sodium chloride 0.9 % 100 mL IVPB  Status:  Discontinued        1 g 200 mL/hr over 30 Minutes Intravenous Every 8 hours 10/22/2022 1410 11/09/22 1103   11/17/2022 0900  micafungin (MYCAMINE) 100 mg in sodium chloride 0.9 % 100 mL IVPB        100 mg 105 mL/hr over 1 Hours Intravenous Every 24 hours 11/18/2022 0803     11/18/2022 0200  piperacillin-tazobactam (ZOSYN) IVPB 3.375 g  Status:  Discontinued        3.375 g 12.5 mL/hr over 240 Minutes Intravenous Every 8 hours 11/19/2022 0119 10/28/2022 1410   11/02/22 1200  piperacillin-tazobactam (ZOSYN) IVPB 3.375 g        3.375 g 12.5 mL/hr over 240 Minutes  Intravenous Every 8 hours 11/02/22 0942 11/06/22 0120   10/31/22 0400  piperacillin-tazobactam (ZOSYN) IVPB 3.375 g  Status:  Discontinued        3.375 g 12.5 mL/hr over 240 Minutes Intravenous Every 8 hours 10/28/2022 2330 11/02/22 0942   11/04/2022 2130  piperacillin-tazobactam (ZOSYN) IVPB 3.375 g        3.375 g 100 mL/hr over 30 Minutes Intravenous  Once 11/12/2022 2120 11/07/2022 2221        Assessment/Plan -POD #11 s/p Ex lap, transverse colectomy and placement of abthera wound vac for bowel perforation, feculent peritonitis, mesenteric ischemia by Dr. Redmond Pulling on 10/31/22 -POD #10 s/p ex lap, creation of colostomy and abdominal closure including retention sutures on 12/11 by Dr. Grandville Silos -POD #3 s/p ex lap, partial colectomy (proximal transverse) with discontinuity, application of Abthera [facility] 12/18 Dr. Zenia Resides; noted arrest in preop -POD#1 s/p Ex lap abdominal washout, Abthera placement 12/20 Dr. Dema Severin - FINDINGS 12/18: Large perforation in the transverse colon just proximal to the colostomy site, with feculent peritonitis.  - FINDINGS 12/20: Extremely friable and fragile tissues, ~1L old blood clot  - Plan to return to OR 12/22 pending improved coags. Discussed with CCM and appreciate their assistance. Getting plt; INR acceptable - Abthera VAC to suction - continue TPN - IV abx, broad spectrum    FEN - TPN; NG tube with strict NPO given he is in discontinuity  VTE - SCDs, On Eliquis at baseline. Argatroban gtt prior (hx HIT) ID - merrem and linezolid   - Per CCM -  AKI on CKD3A  Amyloidosis  History of recurrent VTE: Most recently DVT in 01/2022 - On Eliquis at baseline. Argatroban gtt (hx HIT) now held NSTEMI  -We discussed return to OR for washout and possible ostomy, possible closure; possible replacement of abthera VAC -The planned procedure, material risks (including, but not limited to, pain, bleeding, infection, scarring, need for blood transfusion, damage to surrounding  structures- blood vessels/nerves/viscus/organs, damage to ureter, urine leak, leak from anastomosis, need for additional procedures, need for stoma which will likely be permanent, worsening of pre-existing medical conditions, hernia, recurrence, pneumonia, heart attack, stroke, death) benefits and alternatives to surgery were discussed at length. The patient's questions were answered to their satisfaction, they voiced understanding and elected to proceed with surgery. Additionally, we discussed typical postoperative expectations and the recovery process.  CRITICAL CARE Performed by: Ileana Roup   Total critical care time: 35 minutes  Critical care time was exclusive of separately billable procedures and treating other patients.  Critical care was necessary to treat or prevent imminent or life-threatening deterioration.  Critical care was time spent personally by me on the following activities: development of treatment plan with patient and/or surrogate as well as nursing, discussions with consultants, evaluation of patient's response to treatment, examination of patient, obtaining history from patient or surrogate, ordering and performing treatments and interventions, ordering and review of laboratory studies, ordering and review of radiographic studies, pulse oximetry and re-evaluation of patient's condition.    LOS: 13 days   Nadeen Landau, MD [facility], [place]

## 2022-11-12 NOTE — Progress Notes (Signed)
IP PROGRESS NOTE  Subjective:   Brent Allison remains intubated.  Brent Allison is at the bedside.  Objective: Vital signs in last 24 hours: Blood pressure 105/87, pulse (!) 57, temperature (!) 97.3 F (36.3 C), temperature source Rectal, resp. rate (!) 28, height 6' (1.829 m), weight 238 lb 1.6 oz (108 kg), SpO2 100 %.  Intake/Output from previous day: 12/21 0701 - 12/22 0700 In: 4514.6 [I.V.:3057.7; Blood:601.7; IV Piggyback:855.2] Out: 2190 [Urine:1550; Drains:640]  Physical Exam:  HEENT: ET tube in place. Lungs: Clear anteriorly Cardiac: Distant heart sounds Abdomen: Wound VAC in place, blood-tinged drainage from the wound VAC-lighter in color compared to yesterday Extremities:  pitting edema over the extremities IV sites without bleeding, ecchymoses over the extremities   Lab Results: Recent Labs    11/11/22 2013 11/12/22 0355  WBC 8.2 7.6  HGB 7.7* 7.6*  HCT 22.8* 23.2*  PLT 54* 55*    BMET Recent Labs    11/11/22 0403 11/12/22 0355  NA 140 142  K 4.6 4.3  CL 111 111  CO2 22 21*  GLUCOSE 219* 194*  BUN 51* 57*  CREATININE 1.33* 1.31*  CALCIUM 8.1* 8.3*    No results found for: "CEA1", "CEA", "NLZ767", "CA125"  Studies/Results: No results found.  Medications: I have reviewed the patient's current medications.  Assessment/Plan: Amyloid involving an eyelid biopsy 06/10/2011 "Bruising" at the eyelids and mouth: Likely related to amyloidosis, persistent. Numbness and loss of vibratory sense at the fingertips: This predated Velcade-based therapy, but worsened. Now much improved following cervical spine surgery. Elevated serum free lambda light chains. The lambda light chains were lower on November 16 and slightly higher on 12/14/2011. Lambda light chain proteinuria. Bone marrow plasmacytosis: Variable increase in plasma cells noted on the bone marrow biopsy 07/29/2011 with plasma cells estimated to represent between 4% and 20% of the cellular  population. Remote history of prostate cancer. Sleep apnea. Dyslipidemia. Report of pneumonia on 2 occasions in 2011. Admission to a hospital in [name], [place] October 2012 with "pneumonia." A chest x-ray at [facility] on November 2 was negative .   Low serum immunoglobulin G level. Plasma cell dyscrasia with associated amyloidosis.   Initiation of systemic therapy with Cytoxan, Velcade, and Decadron 08/12/2011. Cycle #2 was initiated on 09/16/2011. Cycle #3 was initiated on 10/15/2011. Velcade was placed on hold due to neuropathy. The serum free lambda light chains were decreased on October 08 2011. The serum free lambda light chains were slightly increased on 12/14/2011, lower on 12/29/2011 and 02/02/2012. Initiation of Revlimid/Decadron February 2013, cycle 2 started on 01/28/2012. High-dose melphalan chemotherapy followed by autologous stem cell infusion on 02/09/2013 Initiation of Velcade/Decadrontherapy 05/28/2013, he completed 2 cycles on a day 1, 4, 8, 11 schedule Improvement in the serum free lambda light chains following induction Velcade/Decadron   Initiation of weekly Velcade/Decadron on 07/16/2013, last treatment on 10/16/2013 Improvement in the serum free lambda light chains on 08/21/2013 and 09/18/2013 [facility]) Normal serum free lambda light chains on 12/13/2013 Mild increase of the serum free lambda light chains beginning January 2016-stable  treatment on the Pronto study at [facility] beginning 12/17/2015, in  of study 11/23/2016 Started on phase 2b Prothena study-long-term safety/efficacy study 01/11/2017, discontinued May 2018  Doxycycline started 05/17/2017, changed to once daily August 2022 01/25/2022 bone marrow biopsy-hypercellular marrow with plasma cell neoplasm, 7%, plasma cells are predominantly small in size with occasional binucleation noted, no lymphocytosis, lambda predominant; 13q-/-13 detected, 1p32/1q21 detected, CCND1(BCL1)/IgH t(11;14)  detected Daratumumab/Decadron initiated 01/28/2022 Serum free  light chains lower 04/02/2022 Serum free light chains stable 06/24/2022 Last daratumumab 07/22/2022 13.  Soft tissue infection/cellulitis right second finger 03/12/2022-Augmentin 14.  04/16/2022 right peroneal vein DVT at the level of the mid calf, favored acute by radiologist; Eliquis initiated 15.  Admission 11/18/2022 with acute abdominal pain/perforated viscus 10/31/2022-exploratory laparotomy, transverse colectomy, placement of wound VAC-feculent peritonitis, mesenteric ischemia 11/06/2022-exploratory laparotomy, creation of colostomy, abdominal closure with retention sutures 10/24/2022-increased abdominal pain, exploratory laparotomy found a large perforation in the transverse colon proximal to the colostomy site with feculent peritonitis, partial transverse colectomy, wound VAC 11/06/2022-exploratory laparotomy with abdominal washout, friable tissue noted, 1 L of old clot evacuated from the abdomen, and oozing from the omentum and mesentery, application of wound VAC 16.  Anemia/thrombocytopenia secondary to sepsis syndrome, low-grade DIC?,  Bleeding, surgical blood loss 17.  Coagulopathy secondary to sepsis, malnutrition, and polypharmacy      Brent Allison appears unchanged.  He remains critically ill from the bowel perforation with peritonitis/sepsis.  The hemoglobin, platelet count, and PT/INR are stable today.  Hopefully the hematologic parameters will improve with a solution of the sepsis syndrome. I discussed the case with Brent Allison.  He plans to attempt creation of a colostomy or ileostomy today.  The tissue friability and oozing during surgery could be related to amyloidosis.  I found reports of bowel perforation as a rare complication of GI involvement with amyloidosis.  It is possible the transverse colon perforation is related to amyloidosis.  Recommendations: Continue management of sepsis per the critical care  service Management of the abdominal wound per the surgical service Hold anticoagulation Continue vitamin K Transfuse platelets for a count of less than 20,000 or bleeding and as needed for surgery We will ask pathology to stain the bowel resection specimen for amyloid Please call hematology as needed, I will check on him next week      LOS: 13 days   Brent Coder, MD   11/12/2022, 7:40 AM

## 2022-11-12 NOTE — Progress Notes (Signed)
PHARMACY - TOTAL PARENTERAL NUTRITION CONSULT NOTE   Indication: Prolonged ileus  Patient Measurements: Height: 6' (182.9 cm) Weight: 108 kg (238 lb 1.6 oz) IBW/kg (Calculated) : 77.6 TPN AdjBW (KG): 96.9 Body mass index is 32.29 kg/m.  Assessment: 76 yo male admitted on 12/9 for abdominal pain found to have peritonitis due to transverse colon perforation possibly secondary to mesenteric ischemia. S/p ex lap, creating of colostomy and abdominal closure on 12/11. Patient was extubated on 12/12 and started on a clear liquid diet, however has had minimal PO intake since admission and unable to tolerate nutritional supplements due to severe nausea. Pharmacy has been consulted to dose TPN.   Glucose / Insulin: hx pre-DM, A1c 5.7%. CBGs running high 207-220 with 20 units regular insulin in TPN + sSSI use: 30u/24 hours (12 units, since new bag). No DM meds PTA. Electrolytes: Na 142, K 4.3 Mg 2.4 Renal: Scr down 1.33, BUN 51 (BL Scr ~1.5-1.7) Hepatic: LFTs slightly elevated; Alb 1.9, Tbili 1.4, TG 113 Intake / Output: +20 L this admit; UOP 0.6 ml/kg/hr; NG 0 ML; wound vac 640 mL   GI Imaging: 12/9 CTAP: suspected pneumatosis, concern for mesenteric ischemia GI Surgeries / Procedures:  12/10: s/p Ex lap, transverse colectomy and placement of abthera wound vac for bowel perforation, feculent peritonitis, mesenteric ischemia 12/11: s/p ex lap, creation of colostomy and abdominal closure including retention sutures 12/18: s/p ex lap, re-perforation - partial colectomy, temporary closure   Central access: ordered to be placed 12/14 TPN start date: 12/14  Nutritional Goals: Goal TPN rate is 80 mL/hr (provides 123 g of protein and 2293 kcals per day meeting 100% of needs)  RD Assessment: Estimated Needs Total Energy Estimated Needs: 2200-2400 Total Protein Estimated Needs: 120-140 grams Total Fluid Estimated Needs: >2.0 L  Current Nutrition:  12/14 TPN>>  12/15 CLD - Boost Breeze TID as  tolerated (intermittently refusing d/t nausea) 12/16 advancing to full liquids - Boost x2 12/16 12/17 STRICT NPO   Plan:  Continue concentrated TPN to 49m/hr at 1800 (will provide 122 g AA and 2293 kCal meeting 100% of patient needs) Electrolytes in TPN: Na 8 mEq/L, K 25 mEq/L, Ca 5 mEq/L, Mg 7 mEq/L, and Phos 5 mmol/L. Max Acetate - no changes Add standard trace elements and MVI to TPN Continue Resistant q4h SSI and adjust as needed  Continue 40 units insulin regular in TPN bag  Monitor TPN labs on Mon/Thurs, daily as needed  CAlanda Slim PharmD, FSalem Laser And Surgery CenterClinical Pharmacist Please see AMION for all Pharmacists' Contact Phone Numbers 11/12/2022, 7:38 AM

## 2022-11-12 NOTE — Transfer of Care (Signed)
Immediate Anesthesia Transfer of Care Note  Patient: Brent Allison  Procedure(s) Performed: EXPLORATORY LAPAROTOMY, ABDOMINAL WASHOUT, HEMICOLECTOMY, OSTOMY CREATION APPLICATION OF WOUND VAC  Patient Location: ICU  Anesthesia Type:General  Level of Consciousness: Patient remains intubated per anesthesia plan  Airway & Oxygen Therapy: Patient remains intubated per anesthesia plan and Patient placed on Ventilator (see vital sign flow sheet for setting)  Post-op Assessment: Report given to RN and Post -op Vital signs reviewed and stable  Post vital signs: Reviewed and stable  Last Vitals:  Vitals Value Taken Time  BP    Temp    Pulse    Resp    SpO2      Last Pain:  Vitals:   11/12/22 0348  TempSrc: Rectal  PainSc:       Patients Stated Pain Goal: 2 (92/01/00 7121)  Complications: No notable events documented.

## 2022-11-12 NOTE — Op Note (Signed)
11/12/2022  10:00 AM  PATIENT:  Brent Allison  76 y.o. male  Patient Care Team: Michael Boston, MD as PCP - General (Internal Medicine)  PRE-OPERATIVE DIAGNOSIS:  Open abdomen; history of perforated viscus  POST-OPERATIVE DIAGNOSIS:  Same  PROCEDURE:  Exploratory laparotomy with right hemicolectomy with end ileostomy Abdominal washout Placement of wound VAC  SURGEON:  Sharon Mt. Rayner Erman, MD  ASSISTANT: OR staff  ANESTHESIA:   general  COUNTS:  Sponge, needle and instrument counts were reported correct x2 at the conclusion of the operation.  EBL: 200 mL  DRAINS: None  SPECIMEN: Right colon (including terminal ileum, appendix)  COMPLICATIONS: None  FINDINGS: Ischemic appearing cecum which was nonviable.  Therefore, we opted to proceed with a right hemicolectomy to remove this and fashioned an end ileostomy.  Abdomen was closed with internal retention sutures as well.  Skin was close given the fragile nature of his tissues and concern for potential evisceration of dehiscence.  An incisional wound VAC was applied (Prevena)  DISPOSITION: PACU in satisfactory condition  DESCRIPTION: The patient was identified in the intensive care unit and taken directly to the operating room where he was placed supine on the operating table.  SCDs were in place.  General anesthesia was induced without difficulty.  Abdominal VAC was taken down and the abdomen and bowel bag were prepped with Betadine.  A surgical timeout was performed indicating the correct patient, procedure, positioning and need for preoperative antibiotics.   The bowel bag was removed.  Clots which were minimal were evacuated as well.  The prior retained laparotomy sponge was removed and passed off.  The abdomen was then surveyed.  Filmy type thin acute adhesions were gently lysed to facilitate exposure of the viscera.  The cecum appears ischemic and not viable. The proximal transverse colon staple line from his surgery  Monday AM has a fair amount of devascularized mesentery as well with purulence exuding from the mesentery.  CASE DATA:  Type of patient?: DOW CASE (Surgical Hospitalist [facility])  Status of Case? URGENT Add On  Infection Present At Time Of Surgery (PATOS)?  PUS & Phlegmon involving proximal transverse colon mesentery    Our initial plan was for a potential loop ileostomy but given this finding, it is clear that this would need to be addressed.  After pausing for him to receive final platelet transfusions, we proceeded with a right hemicolectomy.  The Christyna Letendre line of Toldt is incised to facilitate mobilization of his ascending colon.  The hepatic flexure was also taken down from the lateral to medial Approach.  The duodenum was identified and protected and swept "down."  After fully mobilized the right colon out to the level of prior transection, the intervening mesentery was then ligated and divided using the LigaSure.  The planned point of division is on the terminal ileum.  A window was created the mesentery at this level and the bowel was divided with a GIA 75 blue load stapler.  The remnant tongue of mesentery is then ligated and divided using the LigaSure.  The cut edge of the mesentery is inspected and noted to be hemostatic.  The right colon was passed off the specimen and includes the appendix.  The abdomen is then copiously irrigated with warm saline until the effluent is clear.  There is no active bleeding within the abdomen and no other areas of purulence.  There is retained stool in his left colon that is firm.  The remaining left colon  appears viable to the extent we can visualize.  It is buried within that up at the level of the splenic flexure and we are concerned that attempts to expose this could result in significant bleeding.  Attention was then directed to creating the ileostomy.  Using his prior ostomy site, the distal ileal staple line is brought through the fascia.  It was  apparent this point that we need to close the fascia partly to prevent bowel herniation through the ostomy site.  This was done with the cephalad and caudad portions using 0 Novafil suture.  This was done to the degree of the ostomy fit nicely within this defect related to snug.  Reinspected hemostasis verified.  Attention was then directed at fascial closure.  Our first laparotomy sponge count was correct.  The midline fascia was then closed with running #1 PDS sutures and internal retention sutures of 0 Novafil.  All sponge, needle, and instrument counts reported correct x 2. The midline wound is washed and dried.  Given how tenuous his tissues have been prior fascial closure, we opted to close the skin in the event that there is any fascial separation, and will hopefully serve to protect the underlying bowel and prevent evisceration.  The skin was approximated using skin staples.  Negative pressure Prevena wound VAC was then cut to fit and applied.  Space cut to accommodate his ostomy.  The VAC is placed to suction and has a good seal.  The ileostomy was then matured in a partial Brooke manner.  This was done using 3-0 Vicryl suture.  The ileum was pink and well-perfused appearance.  An ostomy appliance was then cut to fit and applied.   He was then transferred back to a stretcher for transport to the intensive care unit in critical but stable condition.  I was able to update his daughter, Brent Allison, at conclusion.

## 2022-11-12 NOTE — Progress Notes (Signed)
NAME:  Brent Allison, MRN:  [id number], DOB:  February 16, 1946, LOS: 64 ADMISSION DATE:  10/29/2022, CONSULTATION DATE:  11/12/22 REFERRING MD:  Dr. Alvino Chapel ED, CHIEF COMPLAINT: Abdominal pain  History of Present Illness:  76 year old with known history of below presents with acute onset of abdominal pain about 7:30 PM, peritoneal exam, found to have viscus perforation on CT of the abdomen.  Acute onset of pain.  CT scan revealed intestinal perforation.  He takes Eliquis for VTE history.  Last dose this morning.  Received Andexxa in the ED for reversal.  Surgery was consulted.  Taken to the OR.  They recommended medical ICU admission versus surgical admission given complex medical history.  Lab notable for mild elevation in creatinine, AKI, mild leukocytosis with left shift, hemoglobin appears at baseline.  Blood cultures were obtained.  Pertinent  Medical History  History of amyloidosis, history of diastolic dysfunction although euvolemic on exam, history of DVT  Significant Hospital Events: Including procedures, antibiotic start and stop dates in addition to other pertinent events   11/09/2022 presents with acute onset abdominal pain found to have abdominal perforation 10/31/22  - Surgery : POD 0 s/p Ex lap, transverse colectomy and placement of abthera wound vac for bowel perforation, feculent peritonitis, mesenteric ischemia by Dr. Redmond Pulling on 10/31/22. - Intra-Op Findings: significant stool contamination, old periumbilical mesh, omentum and colonic tissue very friable and essentially fell apart, silk sutures marking proximal transverse & distal transverse colon.  On Eliquis at baseline. Andexxa pre-op. T  11/06/2022 - surgery: POD 0 s/p ex-lap, creation of colostomy and abdominal closure with retention sutures by Dr. Grandville Silos; holding [facility]. Tolerating PS 5 over 5, extubate. 11/21/2022: Large perforation in the transverse colon just proximal to the colostomy site, with feculent peritonitis. S/p ex  lap, partial colectomy (proximal transverse) with discontinuity, application of wound VAC; noted arrest in preop. Started on meropenum and micafungin. Patient intubated.  11/09/22: Started on linezolid for MRSA coverage 11/04/2022: surgery: s/p ex-lap with abdominal washout, friable and fragile tissues, ~1L of old blood clot evacuated from abdomen, oozing omentum, application of Abthera wound VAC   Interim History / Subjective:  Patient remains intubated. Decreased pressor requirements. Daughters at bedside. Returning to OR this morning.   Objective   Blood pressure 105/87, pulse 61, temperature (!) 97.3 F (36.3 C), temperature source Rectal, resp. rate (!) 25, height 6' (1.829 m), weight 108 kg, SpO2 100 %.    Vent Mode: PRVC FiO2 (%):  [40 %] 40 % Set Rate:  [28 bmp] 28 bmp Vt Set:  [580 mL] 580 mL PEEP:  [5 cmH20] [place] Pressure:  [21 cmH20-22 cmH20] 22 cmH20   Intake/Output Summary (Last 24 hours) at 11/12/2022 0943 Last data filed at 11/12/2022 0930 Gross per 24 hour  Intake 4733.46 ml  Output 2145 ml  Net 2588.46 ml    Filed Weights   10/29/2022 0500 11/11/22 1315 11/12/22 0354  Weight: 108.6 kg 112 kg 108 kg    Examination: General: ill-appearing male, intubated, in no acute distress HEENT:  ETT in place  Neuro: sedated Pulmonary: mechanically ventilated breath sounds Heart: RRR Abdomen: Soft, distended, wound VAC in place  Skin: generalized edema   Resolved Hospital Problem list     Assessment & Plan:  Severe sepsis with septic shock in the setting of recurrent bowel perforation, feculent peritonitis S/p ex-lap with partial colectomy and wound VAC placement  Lactic acidosis 12/20 s/p ex lap with abdominal washout, noted to have ~1L  of old blood clot coating visceral surface and oozing of omentum. MAP remain mostly above goal of 65. Still requiring norepinephrine. Blood cultures so far no growth x 4 days. Plan to return to OR this morning.  -on  norepinephrine; off epinephrine and vasopressin -MAP goal 65 -hydrocortisone 50 mg q12 -on meropenem and micafungin (day 5) -f/u blood cultures   Acute hypoxic respiratory failure 2/2 above Patient was re-intubated. Continue lung protective ventilation VAP prevention bundle in place PAD protocol with Precedex and fentanyl infusions, RASS goal -2  Mixed metabolic and respiratory acidosis Bicarb 21 today. Lactic acid improved.   Acute blood loss anemia, perioperative Thrombocytopenia Hgb 7.6 today. Platelet 141>80>51>60>55. Received 2 units FFP and vitamin K yesterday. Will transfuse platelets this morning prior to OR. -f/u on CBC, PT-INR -transfuse platelets -vitamin K today  History of recurrent VTE Most recently on 01/2022. On indefinite anticoagulation with eliquis. Was on argatroban but stopped on 12/18.  -Holding anticoagulation in the setting of acute blood loss anemia and repeat surgery.  CKD 3A Serum creatinine is at baseline, monitor intake and output. Avoid nephrotoxic agents  Demand cardiac ischemia in the setting of recurrent bowel perforation Workup has been negative for ischemia  Prediabetes A1c 5.7% Continue sliding scale with CBG goal 140-180. Pharmacy will adjust insulin in TPN.   Multiple Myeloma AL Amyloidosis MM diagnosed in 2012, AL amyloidosis diagnosed in 2013. Remote history of bone marrow transplant. Treatment being held since August 2023 due to low energy. Follows with Dr. [name]. Continue support of care  Pressure Injury Stage 1 injury of medial sacrum noted. Continue with wound care.    Best Practice (right click and "Reselect all SmartList Selections" daily)   Diet/type: NPO DVT prophylaxis: SCD GI prophylaxis:  PPI  Lines: Central line and Arterial Line  Foley:  yes  Code Status:  full code Last date of multidisciplinary goals of care discussion [family updated at bedside today]

## 2022-11-13 ENCOUNTER — Inpatient Hospital Stay (HOSPITAL_COMMUNITY): Payer: Medicare Other

## 2022-11-13 DIAGNOSIS — R198 Other specified symptoms and signs involving the digestive system and abdomen: Secondary | ICD-10-CM | POA: Diagnosis not present

## 2022-11-13 DIAGNOSIS — J9601 Acute respiratory failure with hypoxia: Secondary | ICD-10-CM | POA: Diagnosis not present

## 2022-11-13 LAB — PREPARE PLATELET PHERESIS
Unit division: 0
Unit division: 0

## 2022-11-13 LAB — BPAM PLATELET PHERESIS
Blood Product Expiration Date: 202312242359
Blood Product Expiration Date: 202312242359
ISSUE DATE / TIME: 202312220805
ISSUE DATE / TIME: 202312220805
Unit Type and Rh: 5100
Unit Type and Rh: 6200

## 2022-11-13 LAB — GLUCOSE, CAPILLARY
Glucose-Capillary: 191 mg/dL — ABNORMAL HIGH (ref 70–99)
Glucose-Capillary: 183 mg/dL — ABNORMAL HIGH (ref 70–99)
Glucose-Capillary: 197 mg/dL — ABNORMAL HIGH (ref 70–99)
Glucose-Capillary: 178 mg/dL — ABNORMAL HIGH (ref 70–99)
Glucose-Capillary: 157 mg/dL — ABNORMAL HIGH (ref 70–99)
Glucose-Capillary: 133 mg/dL — ABNORMAL HIGH (ref 70–99)

## 2022-11-13 LAB — CULTURE, BLOOD (ROUTINE X 2)
Culture: NO GROWTH
Culture: NO GROWTH

## 2022-11-13 LAB — CBC
HCT: 23.5 % — ABNORMAL LOW (ref 39.0–52.0)
Hemoglobin: 7.7 g/dL — ABNORMAL LOW (ref 13.0–17.0)
MCH: 30.3 pg (ref 26.0–34.0)
MCHC: 32.8 g/dL (ref 30.0–36.0)
MCV: 92.5 fL (ref 80.0–100.0)
Platelets: 90 10*3/uL — ABNORMAL LOW (ref 150–400)
RBC: 2.54 MIL/uL — ABNORMAL LOW (ref 4.22–5.81)
RDW: 18.6 % — ABNORMAL HIGH (ref 11.5–15.5)
WBC: 12.6 10*3/uL — ABNORMAL HIGH (ref 4.0–10.5)
nRBC: 10.3 % — ABNORMAL HIGH (ref 0.0–0.2)

## 2022-11-13 LAB — PROTIME-INR
INR: 1.5 — ABNORMAL HIGH (ref 0.8–1.2)
Prothrombin Time: 18 seconds — ABNORMAL HIGH (ref 11.4–15.2)

## 2022-11-13 MED ORDER — ALBUMIN HUMAN 25 % IV SOLN
25.0000 g | Freq: Four times a day (QID) | INTRAVENOUS | Status: DC
  Administered 2022-11-13: 25 g via INTRAVENOUS
  Filled 2022-11-13: qty 100

## 2022-11-13 MED ORDER — FUROSEMIDE 10 MG/ML IJ SOLN: 40.0000 mg | Freq: Once | INTRAMUSCULAR | Status: DC

## 2022-11-13 MED ORDER — FUROSEMIDE 10 MG/ML IJ SOLN
20.0000 mg | Freq: Once | INTRAMUSCULAR | Status: AC
  Administered 2022-11-13: 20 mg via INTRAVENOUS
  Filled 2022-11-13: qty 2

## 2022-11-13 MED ORDER — HYDROCORTISONE SOD SUC (PF) 100 MG IJ SOLR: 50.0000 mg | Freq: Every day | INTRAMUSCULAR | Status: DC

## 2022-11-13 MED ORDER — TRACE MINERALS CU-MN-SE-ZN 300-55-60-3000 MCG/ML IV SOLN
INTRAVENOUS | Status: DC
  Filled 2022-11-13: qty 817.6

## 2022-11-13 NOTE — Consult Note (Addendum)
[place] Nurse ostomy consult note Surgical team is following for assessment and plan of care for ABThera dressing changes to abd wound; this was changed yesterday in the OR according to progress notes.  Please refer to their team for further questions regarding plan of care.  Pt had ileostomy surgery performed yesterday.  Stoma is red and viable when visualized through the pouch, which is intact with good seal. No stool or flatus, small amt pink drainage. Pt is critically ill and on the ventilator and not ready for teaching. Educational materials left at the bedside and 5 sets of supplies ordered to the bedside for staff nurse's use: Use Supplies: barrier ring, Lawson # [id number] and convex pouch Lawson # [id number]. [place] team will begin teaching sessions when Pt is stable and out of ICU.  Enrolled patient in [place] program: NOT YET. Thank-you,  Julien Girt MSN, [place], [place], [place], [place]

## 2022-11-13 NOTE — Progress Notes (Signed)
PHARMACY - TOTAL PARENTERAL NUTRITION CONSULT NOTE   Indication: Bowel perforation, feculent peritonitis   Patient Measurements: Height: 6' (182.9 cm) Weight: 101.4 kg (223 lb 8.7 oz) IBW/kg (Calculated) : 77.6 TPN AdjBW (KG): 96.9 Body mass index is 30.32 kg/m.  Assessment: 76 yo male admitted on 12/9 for abdominal pain found to have bowel perforation, feculent peritonitis and mesenteric ischemia. S/p multiple ex lap, creating of colostomy and abdominal closure on 12/10 and 12/11. Patient was extubated on 12/12 and started on a clear liquid diet, however has had minimal PO intake since admission and unable to tolerate nutritional supplements due to severe nausea. Pharmacy has been consulted to dose TPN.   12/18 patient underwent another ex lap and found to have large perforation of the transverse colon with feculent peritonitis s/p partial colectomy and discontinuity. Further operations needed.   Glucose / Insulin: BG 164-196 on hydrocortisone, hx pre-DM, A1c 5.7%. 40 units regular insulin in TPN + sSSI use: 24u/24 hours. No DM meds PTA. Electrolytes: Last labs 12/22: Na 142, K 4.3, CO2 21 (max acetate in TPN), Mg 2.4, phos 2.7 down, Renal: Scr down 1.31, BUN 57 (BL Scr ~1.5-1.7) Hepatic: LFTs slightly elevated; Alb 1.9, Tbili 1.4, TG 113 Intake / Output: +27.7 L this admit; UOP 0.7 ml/kg/hr; NG 100 ML; wound vac 50 mL, EBL 200 ml  GI Imaging: 12/9 CTAP: suspected pneumatosis, concern for mesenteric ischemia GI Surgeries / Procedures:  12/10: s/p Ex lap, transverse colectomy and placement of abthera wound vac for bowel perforation, feculent peritonitis, mesenteric ischemia 12/11: s/p ex lap, creation of colostomy and abdominal closure including retention sutures 12/18: s/p ex lap, re-perforation - partial colectomy, temporary closure  12/20: ex lap with washout, 1L old blood coating visceral surface 12/22: ex lap w/ washout, R hemicolectomy with end ileostomy, wound vac  Central  access: 12/14 TPN start date: 12/14  Nutritional Goals: Goal TPN rate is 70 mL/hr (provides 123 g of protein and 2235 kcals per day meeting 100% of needs)  RD Assessment: Estimated Needs Total Energy Estimated Needs: 2200-2400 Total Protein Estimated Needs: 120-140 grams Total Fluid Estimated Needs: >2.0 L  Current Nutrition:  12/14 TPN>>  12/15 CLD - Boost Breeze TID as tolerated (intermittently refusing d/t nausea) 12/16 advancing to full liquids - Boost x2 12/16 12/17 STRICT NPO   Plan:  Continue concentrated TPN to 19m/hr at 1800 (will provide 123 g AA and 2235 kCal meeting 100% of patient needs) Electrolytes in TPN: Decrease Mg 4 mEq/L; Increase Phos 12 mmol/L; Continue Na 8 mEq/L, K 25 mEq/L, Ca 5 mEq/L, Max Acetate Add standard trace elements and MVI to TPN Continue Resistant q4h SSI and adjust as needed  Increase to 60 units insulin regular in TPN bag  Monitor TPN labs on Mon/Thurs, daily as needed  LBenetta Spar PharmD, BCPS, BCCP Clinical Pharmacist  Please check AMION for all [place] numbers After 10:00 PM, call [place]

## 2022-11-13 NOTE — Progress Notes (Signed)
Patient ID: Brent Allison, male   DOB: August 23, 1946, 76 y.o.   MRN: [id number] 1 Day Post-Op    Subjective: On vent ROS negative except as listed above. Objective: Vital signs in last 24 hours: Temp:  [96.1 F (35.6 C)-99.1 F (37.3 C)] 98.2 F (36.8 C) (12/23 0745) Pulse Rate:  [56-97] 84 (12/23 0826) Resp:  [19-32] 20 (12/23 0826) BP: (64-180)/(36-79) 140/49 (12/23 0745) SpO2:  [94 %-100 %] 97 % (12/23 0826) Arterial Line BP: (67-212)/(32-90) 96/90 (12/23 0630) FiO2 (%):  [40 %] 40 % (12/23 0826) Weight:  [101.4 kg] 101.4 kg (12/23 0305) Last BM Date :  (PTA)  Intake/Output from previous day: 12/22 0701 - 12/23 0700 In: 4005.9 [I.V.:2839.8; Blood:656; NG/GT:60; IV Piggyback:450.1] Out: 9924 [Urine:1060; Emesis/NG output:100; Drains:50; Blood:200] Intake/Output this shift: No intake/output data recorded.  General appearance: no distress Resp: clear to auscultation bilaterally GI: soft, prevena VAC, ostomy viable min SS output  Lab Results: CBC  Recent Labs    11/12/22 1643 11/15/2022 0302  WBC 13.2* 12.6*  HGB 7.7* 7.7*  HCT 23.4* 23.5*  PLT 102* 90*   BMET Recent Labs    11/11/22 0403 11/12/22 0355  NA 140 142  K 4.6 4.3  CL 111 111  CO2 22 21*  GLUCOSE 219* 194*  BUN 51* 57*  CREATININE 1.33* 1.31*  CALCIUM 8.1* 8.3*   PT/INR Recent Labs    11/12/22 1643 11/18/2022 0302  LABPROT 19.3* 18.0*  INR 1.6* 1.5*   ABG No results for input(s): "PHART", "HCO3" in the last 72 hours.  Invalid input(s): "PCO2", "PO2"  Studies/Results: No results found.  Anti-infectives: Anti-infectives (From admission, onward)    Start     Dose/Rate Route Frequency Ordered Stop   11/11/22 1530  meropenem (MERREM) 1 g in sodium chloride 0.9 % 100 mL IVPB        1 g 200 mL/hr over 30 Minutes Intravenous Every 8 hours 11/11/22 1431     11/09/22 1800  meropenem (MERREM) 1 g in sodium chloride 0.9 % 100 mL IVPB  Status:  Discontinued        1 g 200 mL/hr over 30  Minutes Intravenous Every 12 hours 11/09/22 1103 11/11/22 1431   11/09/22 1245  linezolid (ZYVOX) IVPB 600 mg  Status:  Discontinued        600 mg 300 mL/hr over 60 Minutes Intravenous Every 12 hours 11/09/22 1146 11/11/22 1047   10/29/2022 1500  meropenem (MERREM) 1 g in sodium chloride 0.9 % 100 mL IVPB  Status:  Discontinued        1 g 200 mL/hr over 30 Minutes Intravenous Every 8 hours 10/29/2022 1410 11/09/22 1103   11/09/2022 0900  micafungin (MYCAMINE) 100 mg in sodium chloride 0.9 % 100 mL IVPB        100 mg 105 mL/hr over 1 Hours Intravenous Every 24 hours 11/02/2022 0803     11/06/2022 0200  piperacillin-tazobactam (ZOSYN) IVPB 3.375 g  Status:  Discontinued        3.375 g 12.5 mL/hr over 240 Minutes Intravenous Every 8 hours 11/07/2022 0119 11/18/2022 1410   11/02/22 1200  piperacillin-tazobactam (ZOSYN) IVPB 3.375 g        3.375 g 12.5 mL/hr over 240 Minutes Intravenous Every 8 hours 11/02/22 0942 11/06/22 0120   10/31/22 0400  piperacillin-tazobactam (ZOSYN) IVPB 3.375 g  Status:  Discontinued        3.375 g 12.5 mL/hr over 240 Minutes Intravenous Every 8 hours 10/24/2022  2330 11/02/22 0942   11/19/2022 2130  piperacillin-tazobactam (ZOSYN) IVPB 3.375 g        3.375 g 100 mL/hr over 30 Minutes Intravenous  Once 11/15/2022 2120 11/05/2022 2221       Assessment/Plan: -POD #12 s/p Ex lap, transverse colectomy and placement of abthera wound vac for bowel perforation, feculent peritonitis, mesenteric ischemia by Dr. Redmond Pulling on 10/31/22 -POD #11 s/p ex lap, creation of colostomy and abdominal closure including retention sutures on 12/11 by Dr. Grandville Silos -POD #4 s/p ex lap, partial colectomy (proximal transverse) with discontinuity, application of Abthera [facility] 12/18 Dr. Zenia Resides; noted arrest in preop -POD#2 s/p Ex lap abdominal washout, Abthera placement 12/20 Dr. Dema Severin -POD#1 S/P R colectomy and end ileostomy, closure by Dr. Dema Severin - FINDINGS 12/18: Large perforation in the transverse colon just proximal  to the colostomy site, with feculent peritonitis.  - FINDINGS 12/20: Extremely friable and fragile tissues, ~1L old blood clot  - AROBF -Prevena VAC - IV abx, broad spectrum    FEN - TPN; NG tube with strict NPO given he is in discontinuity  VTE - SCDs, On Eliquis at baseline. Argatroban gtt prior (hx HIT) ID - merrem and linezolid   - Per CCM -  AKI on CKD3A  Amyloidosis  History of recurrent VTE: Most recently DVT in 01/2022 - On Eliquis at baseline. Argatroban gtt (hx HIT) now held NSTEMI  I spoke with his daughter at the bedside  LOS: 14 days    Georganna Skeans, MD, MPH, FACS Trauma & General Surgery Use [email or web address] to contact on call provider  11/18/2022

## 2022-11-13 NOTE — Progress Notes (Signed)
Code Note  Developed some bradycardia and appeared to be trying to cough. RN attempted to suction and then developed PEA arrest. 1 round of ACLS with epi with ROSC, remained on 40 of levo afterward. Lost pulse again shortly afterward, began ACLS. Notified daughters of his arrest. We talked about poor likelihood of recovery to leave hospital in functional state in arrest in setting of shock on pressors. They agreed he should be DNR and to stop chest compressions. ROSC achieved after 3 rounds ACLS and then had another cardiac arrest and was pronounce dead at  2350. Family notified, plan to meet chaplain at main entrance.   Cc time 30 minutes  Brent Allison

## 2022-11-13 NOTE — Progress Notes (Signed)
Patient A-line very damp and not reading accurately. Dr. Ander Slade at bedside. Discussed need for A-line for routine blood gases. There is a pulsatile rhythm. Dr. Ander Slade verbal order to keep A-line for now and monitor for clotting. Rely on BP cuff pressure for levophed requirements.

## 2022-11-13 NOTE — Progress Notes (Signed)
Placed back on full support per MD verbal order

## 2022-11-13 NOTE — Progress Notes (Addendum)
NAME:  Brent Allison, MRN:  [id number], DOB:  29-May-1946, LOS: 71 ADMISSION DATE:  11/09/2022, CONSULTATION DATE:  11/03/2022 REFERRING MD:  Dr. Alvino Chapel ED, CHIEF COMPLAINT: Abdominal pain  History of Present Illness:  76 year old with known history of below presents with acute onset of abdominal pain about 7:30 PM, peritoneal exam, found to have viscus perforation on CT of the abdomen.  Acute onset of pain.  CT scan revealed intestinal perforation.  He takes Eliquis for VTE history.  Last dose this morning.  Received Andexxa in the ED for reversal.  Surgery was consulted.  Taken to the OR.  They recommended medical ICU admission versus surgical admission given complex medical history.  Lab notable for mild elevation in creatinine, AKI, mild leukocytosis with left shift, hemoglobin appears at baseline.  Blood cultures were obtained.  Pertinent  Medical History  History of amyloidosis, history of diastolic dysfunction although euvolemic on exam, history of DVT  Significant Hospital Events: Including procedures, antibiotic start and stop dates in addition to other pertinent events   11/07/2022 presents with acute onset abdominal pain found to have abdominal perforation 10/31/22  - Surgery : POD 0 s/p Ex lap, transverse colectomy and placement of abthera wound vac for bowel perforation, feculent peritonitis, mesenteric ischemia by Dr. Redmond Pulling on 10/31/22. - Intra-Op Findings: significant stool contamination, old periumbilical mesh, omentum and colonic tissue very friable and essentially fell apart, silk sutures marking proximal transverse & distal transverse colon.  On Eliquis at baseline. Andexxa pre-op. T  10/26/2022 - surgery: POD 0 s/p ex-lap, creation of colostomy and abdominal closure with retention sutures by Dr. Grandville Silos; holding [facility]. Tolerating PS 5 over 5, extubate. 11/05/2022: Large perforation in the transverse colon just proximal to the colostomy site, with feculent peritonitis. S/p ex  lap, partial colectomy (proximal transverse) with discontinuity, application of wound VAC; noted arrest in preop. Started on meropenum and micafungin. Patient intubated.  11/09/22: Started on linezolid for MRSA coverage 10/28/2022: surgery: s/p ex-lap with abdominal washout, friable and fragile tissues, ~1L of old blood clot evacuated from abdomen, oozing omentum, application of Abthera wound VAC 12/22-colectomy and end ileostomy  Interim History / Subjective:  Remains intubated He is very weak but responsive, follows commands, moves extremities to command no other significant overnight events  Objective   Blood pressure (!) 131/57, pulse 85, temperature 98.4 F (36.9 C), resp. rate (!) 24, height 6' (1.829 m), weight 101.4 kg, SpO2 100 %.    Vent Mode: PRVC FiO2 (%):  [40 %] 40 % Set Rate:  [20 bmp-28 bmp] 20 bmp Vt Set:  [580 mL] 580 mL PEEP:  [5 cmH20] 5 cmH20 Plateau Pressure:  [19 cmH20-24 cmH20] 19 cmH20   Intake/Output Summary (Last 24 hours) at 11/12/2022 1022 Last data filed at 10/23/2022 0900 Gross per 24 hour  Intake 3454.66 ml  Output 1055 ml  Net 2399.66 ml   Filed Weights   11/11/22 1315 11/12/22 0354 10/29/2022 0305  Weight: 112 kg 108 kg 101.4 kg    Examination: General: Elderly gentleman, does not appear to be in distress, does appear frail HEENT: Endotracheal tube in place Neuro: Sedated, on fentanyl, being weaned-following commands appropriately Pulmonary: Decreased air movement bilaterally Heart: S1-S2 appreciated Abdomen: Soft, distended, wound VAC in place  Skin: generalized edema   Resolved Hospital Problem list     Assessment & Plan:   Severe sepsis with septic shock in the setting of recurrent bowel perforation Feculent peritonitis S/p expiratory laparotomy with partial colectomy and  wound VAC placement Last operative intervention 11/12/2022 -Was able to wean off pressors finally -On stress dose steroids steroids-being weaned-last day of stress  dose steroids today -On meropenem and micafungin day 6 -Blood culture 12/18 no growth  Acute hypoxemic respiratory failure -Continue lung protective ventilation -VAP prevention bundle in place -Obtain chest x-ray today -Weaning  Acute blood loss anemia Perioperative thrombocytopenia -H&H is stable -Transfuse as needed -Platelets improving  History of VTE -Holding anticoagulation because of bleeding risk  Chronic kidney disease stage IIIa -Continue to monitor input -Is significantly History of recurrent VTE  Demand cardiac ischemia in the setting of recurrent bowel perforation  Prediabetes -Continue sliding scale  History of multiple myeloma History of AL amyloidosis  Sepsis contributing to decreased bone marrow function  Patient is profoundly weak at present May take time before he is able to successfully wean and be extubated above Will attempt to diurese today if blood pressure tolerates it -Albumin is low, will give albumin and possibly be able to diurese Did tolerate weaning for a little while but got tired  Best Practice (right click and "Reselect all SmartList Selections" daily)   Diet/type: NPO, on TPN DVT prophylaxis: SCD GI prophylaxis:  PPI  Lines: Central line and Arterial Line  Foley:  yes  Code Status:  full code Last date of multidisciplinary goals of care discussion [family updated at bedside today-12/23]   The patient is critically ill with multiple organ systems failure and requires high complexity decision making for assessment and support, frequent evaluation and titration of therapies, application of advanced monitoring technologies and extensive interpretation of multiple databases. Critical Care Time devoted to patient care services described in this note independent of APP/resident time (if applicable)  is 35 minutes.   Sherrilyn Rist MD [facility] Personal pager: See Amion If unanswered, please page CCM On-call:  [phone]

## 2022-11-14 LAB — BPAM FFP
Blood Product Expiration Date: 202312272359
Blood Product Expiration Date: 202312272359
Blood Product Expiration Date: 202312272359
Blood Product Expiration Date: 202312272359
ISSUE DATE / TIME: 202312220816
ISSUE DATE / TIME: 202312241746
ISSUE DATE / TIME: 202312241746
ISSUE DATE / TIME: 202312241807
Unit Type and Rh: 6200
Unit Type and Rh: 6200
Unit Type and Rh: 6200
Unit Type and Rh: 6200

## 2022-11-14 LAB — PREPARE FRESH FROZEN PLASMA
Unit division: 0
Unit division: 0
Unit division: 0

## 2022-11-15 ENCOUNTER — Encounter (HOSPITAL_COMMUNITY): Payer: Self-pay | Admitting: Surgery

## 2022-11-16 LAB — BPAM RBC
Blood Product Expiration Date: 202401152359
Blood Product Expiration Date: 202401162359
Blood Product Expiration Date: 202401172359
Blood Product Expiration Date: 202401172359
Unit Type and Rh: 600
Unit Type and Rh: 6200
Unit Type and Rh: 6200
Unit Type and Rh: 6200

## 2022-11-16 LAB — TYPE AND SCREEN
ABO/RH(D): A POS
Antibody Screen: POSITIVE
Unit division: 0
Unit division: 0
Unit division: 0
Unit division: 0

## 2022-11-16 LAB — SURGICAL PATHOLOGY

## 2022-11-17 ENCOUNTER — Inpatient Hospital Stay: Payer: Medicare Other | Admitting: Oncology

## 2022-11-17 ENCOUNTER — Inpatient Hospital Stay: Payer: Medicare Other

## 2022-11-17 LAB — PATHOLOGIST SMEAR REVIEW: Path Review: INCREASED

## 2022-11-22 NOTE — Progress Notes (Signed)
   11/15/2022 2327  Clinical Encounter Type  Visited With Patient not available;Health care provider  Visit Type Code;Critical Care;Spiritual support  Referral From Physician (Dr. Hortencia Conradi. Verlee Monte, MD)  Consult/Referral To Chaplain Melvenia Beam)  Recommendations CODE BLUE - DEATH  Spiritual Encounters  Spiritual Needs Emotional;Grief support;Prayer  Advance Directives (For Healthcare)  Does Patient Have a Medical Advance Directive? Yes  Type of Paramedic of Media;Living will  Copy of [place] in Chart? Yes - validated most recent copy scanned in chart (See row information)  Copy of Living Will in Chart? Yes - validated most recent copy scanned in chart (See row information)   Chaplain Responded to Page for Code Blue.  Upon arrival Medical Team were providing Resuscitation efforts for Mr. Keno Caraway, III - "Bill." At Dr. Glenetta Hew request met patient's daughter and husband at main entrance and accompanied them to patient's bedside.  Chaplain met with Mr. Hoshino Daughter: Estill Bamberg; Son-in-Law: John at the patient's bedside. Chaplain provided meaningful presence and reflective listening that invited stories of Mr. fahim kats.   Estill Bamberg shared that Rush Landmark and his wife Hoyle Sauer have been married for 16 years and they have three daughters. She spoke of the enormous love that Rush Landmark shared with his wife and three daughters.  Bill and Carolyn's oldest daughter was born with Down's Syndrome and is living in a care facility. Hoyle Sauer suffers with advanced stages of Dementia and is also in a care facility. Amanda's sister Caryl Pina lives in [place] with her husband and their three sons.   Chaplain provided Estill Bamberg with a list of local funeral homes and [facility] contact information. Chaplain invited Estill Bamberg to share family stories with loved ones honoring her father's life. Chaplain accompanied Estill Bamberg and Jenny Reichmann back to the [employer]. [address] [place], [place] [phone]

## 2022-11-22 NOTE — Death Summary Note (Signed)
DEATH SUMMARY   Patient Details  Name: Brent Allison MRN: [id number] DOB: 04-01-1946  Admission/Discharge Information   Admit Date:  11-09-2022  Date of Death: Date of Death: 23-Nov-2022  Time of Death: Time of Death: 2349-02-11  Length of Stay: 2023-02-14  Referring Physician: Michael Boston, MD   Reason(s) for Hospitalization  Admitted with acute onset of abdominal pain, was found to have a perforated viscus needing surgery  Diagnoses  Preliminary cause of death:  Sepsis Mesenteric ischemia Secondary Diagnoses (including complications and co-morbidities):  Principal Problem:   Perforated abdominal viscus Active Problems:   Hemoptysis   Pneumoperitoneum   Malnutrition of moderate degree   Septic shock (HCC)   Acute hypoxemic respiratory failure (HCC) Recurrent venous thrombosis Acute kidney injury on chronic kidney disease stage 3 Amyloidosis Mesenteric ischemia Acute hypoxemic respiratory failure  Brief Hospital Course (including significant findings, care, treatment, and services provided and events leading to death)  Brent Allison is a 77 y.o. year old male who was admitted with lower abdominal pain, was found to have a perforated viscus for which she was taken to the operating room.  He had a transverse colectomy and placement of ABThera wound VAC for bowel perforation, feculent peritonitis, mesenteric ischemia.  Noted to have significant stool contamination, omentum and colonic tissue found to be very friable. Taken back to the OR for the creation of a colostomy and abdominal closure including retention sutures. Developed hemoptysis that was treated with tranexamic acid. Patient developed increasing abdominal pain with respiratory distress, increased leukocytosis, had a CT scan of the abdomen showing gas and fluid collection in the right upper quadrant-felt to be likely related to perforation.  Patient had a partial colectomy secondary to proximal transverse colon  perforation, temporary abdominal closure with negative pressure dressing.  Patient now requiring multiple vasopressors including vasopressin, epinephrine, norepinephrine.  Large amount of bloody drainage via the wound VAC.  Remains on antibiotics-Zosyn, started on micafungin for fungal coverage. Patient was taken back to the operating room on 12/20-abdominal washout was performed, extremely friable and fragile tissues noted, omentum and with minimal movement began to tear and was quite friable.  There was about 1 L of old blood clot coating the visceral surfaces.  Patient received FFP, platelets, vitamin K and taken back to the operating room on 12/22 where he had exploratory laparotomy with right hemicolectomy with end ileostomy, abdominal washout and placement of a wound VAC. Pressor requirement did improve Patient did not tolerate weaning well on the 23rd.  On 24th started requiring increased pressors, unable to wean and with progressive deterioration patient had a bradycardic event leading to a PEA arrest.  Had multiple CPR episodes and following discussions with the family decision was made to transition to DO NOT RESUSCITATE status. Patient continued to deteriorate and succumbed to his illness on 11-23-2022 at 2350 hrs. . Cause of death  mesenteric ischemia Sepsis shock   Pertinent Labs and Studies  Significant Diagnostic Studies DG CHEST PORT 1 VIEW  Result Date: 11/24/2022 CLINICAL DATA:  Cardiac arrest. EXAM: PORTABLE CHEST 1 VIEW COMPARISON:  Earlier radiograph dated 11/23/22. FINDINGS: Support wires overlying the patient. Right IJ central venous line in similar position. Right-sided PICC with tip in central SVC close to the cavoatrial junction. Endotracheal tube in similar position above the carina. Partially visualized enteric tube with side-port in the region of the GE junction. Interval progression of diffuse bilateral pulmonary opacities compared to the earlier radiograph. Probable  small bilateral pleural  effusions. A small left apical pneumothorax may be present measuring approximately 15 mm. Stable cardiac silhouette. Cervical spine fusion. No definite acute osseous pathology. There is extensive left chest wall soft tissue emphysema. Underlying occult rib fracture is not entirely excluded. Evaluation is limited due to overlying support apparatus and defibrillator. IMPRESSION: 1. Possible small left apical pneumothorax. 2. Interval progression of diffuse bilateral pulmonary opacities compared to the earlier radiograph. 3. Support devices as above. These results were called by telephone at the time of interpretation on 10/31/2022 at 11:54 pm to the patient's nurse, Grahm, who verbally acknowledged these results. The nurse informed me that the patient had passed. Electronically Signed   By: Anner Crete M.D.   On: 18-Nov-2022 00:11   DG CHEST PORT 1 VIEW  Result Date: 11/04/2022 CLINICAL DATA:  Respiratory failure EXAM: PORTABLE CHEST 1 VIEW COMPARISON:  Chest x-ray dated November 08, 2022 FINDINGS: Unchanged position of right IJ line and ET tube. Gastric decompression tube is unchanged in position with side port near the GE junction. Cardiac and mediastinal contours are unchanged. Stable small to moderate left-greater-than-right pleural effusions with associated atelectasis. No evidence of pneumothorax IMPRESSION: 1. Stable small to moderate left-greater-than-right pleural effusions with associated atelectasis. 2. Gastric decompression tube is unchanged in position with side port near the GE junction. Consider advancement for optimal positioning. Electronically Signed   By: Yetta Glassman M.D.   On: 10/28/2022 12:17   DG CHEST PORT 1 VIEW  Result Date: 11/21/2022 CLINICAL DATA:  Intubation. EXAM: PORTABLE CHEST 1 VIEW COMPARISON:  11/12/2022 FINDINGS: Endotracheal tube has tip 5.6 cm above the carina. Enteric tube courses into the region of the stomach and off the film as tip is  not visualized. Right IJ central venous catheter and right-sided PICC line unchanged. Lungs are adequately inflated demonstrate hazy opacification over the mid to lower lungs with interval improvement. Findings likely due to bilateral effusions with associated bibasilar atelectasis. Better aeration over the upper lungs. Cardiomediastinal silhouette and remainder of the exam is unchanged. IMPRESSION: 1. Persistent opacification over the mid to lower lungs likely bilateral effusions with associated bibasilar atelectasis. Improved aeration over the upper lobes. 2. Tubes and lines as described. Electronically Signed   By: Marin Olp M.D.   On: 10/22/2022 09:54   DG Chest Port 1 View  Result Date: 11/03/2022 CLINICAL DATA:  [id number].  Endotracheal tube. EXAM: PORTABLE CHEST 1 VIEW COMPARISON:  11/11/2022 FINDINGS: Exam at 7:33 a.m. Endotracheal tube is in place, tip overlying the level of the mid trachea. RIGHT IJ central line has been placed, tip overlying the level of the superior vena cava. RIGHT-sided PICC line tip is unchanged. Nasogastric tube tip is beyond the edge of the image. There is no pneumothorax. Heart which is are obscured by pulmonary opacities at the lung bases. Bilateral pleural effusions are again noted. Bilateral significant basilar pulmonary opacities. IMPRESSION: Interval placement of RIGHT IJ central line, tip overlying the level of the superior vena cava. No pneumothorax. Similar appearance of bibasilar opacities and pleural effusions. Electronically Signed   By: Nolon Nations M.D.   On: 10/29/2022 07:42   DG CHEST PORT 1 VIEW  Result Date: 10/27/2022 CLINICAL DATA:  [id number] Encounter for nasogastric (NG) tube placement [id number] EXAM: PORTABLE CHEST 1 VIEW COMPARISON:  Chest x-ray 11/06/2022, CT chest 12/04/2019 FINDINGS: Enteric tube courses below the hemidiaphragm with tip collimated off view and side port overlying the expected region the gastric lumen. Right PICC with tip  overlying the expected region  of the superior caval junction. Slightly prominent left heart. Otherwise the heart and mediastinal contours are within normal limits. Bilateral lower lung zone airspace opacities. No definite pulmonary edema. Small to moderate right and trace to small left pleural effusions. No pneumothorax. No acute osseous abnormality. Cervicothoracic surgical hardware noted. Degenerative changes of the acromioclavicular joints. IMPRESSION: 1. Bilateral lower lung zone airspace opacities. 2. Small to moderate right and trace to small left pleural effusions. Electronically Signed   By: Iven Finn M.D.   On: 10/26/2022 03:56   CT ABDOMEN PELVIS W CONTRAST  Result Date: 10/31/2022 CLINICAL DATA:  Postop abdominal pain EXAM: CT ABDOMEN AND PELVIS WITH CONTRAST TECHNIQUE: Multidetector CT imaging of the abdomen and pelvis was performed using the standard protocol following bolus administration of intravenous contrast. RADIATION DOSE REDUCTION: This exam was performed according to the departmental dose-optimization program which includes automated exposure control, adjustment of the mA and/or kV according to patient size and/or use of iterative reconstruction technique. CONTRAST:  85m OMNIPAQUE IOHEXOL 350 MG/ML SOLN COMPARISON:  CTA abdomen/pelvis dated 10/31/2022 FINDINGS: Lower chest: Moderate bilateral effusions, new/progressive. Bilateral lower lobe compressive atelectasis. Associated hyperdensity suggests aspiration of contrast. Hepatobiliary: Liver is within normal limits. Layering tiny gallstone (series 3/image 40), without associated inflammatory changes. No intrahepatic or extrahepatic duct dilatation Pancreas: Within normal limits. Spleen: Stable 13 mm low-density lesion in the central spleen (series 3/image 40), likely reflecting a benign cyst. Adrenals/Urinary Tract: Adrenal glands are within normal limits. Kidneys are within normal limits.  No hydronephrosis. Bladder is within  normal limits. Stomach/Bowel: Stomach is within normal limits. No evidence of bowel obstruction. 4.8 x 6.4 x 5.6 cm loculated fluid collection in the left mid abdomen (series 3/image 66), suspicious for abscess, but associated with a loop of small bowel (series 3/image 61). Localized small bowel injury is not excluded. Status post transverse colectomy with right mid abdominal colostomy. Long Hartmann's pouch with extensive sigmoid diverticulosis, without evidence of diverticulitis. Suspected macroscopic colonic perforation just proximal to the transverse colostomy (series 3/image 47), with associated 3.4 x 5.1 x 4.3 cm fluid and gas collection along the mesentery in the right upper abdomen (series 3/image 43), ill-defined. Additional scattered foci of free air. Vascular/Lymphatic: No evidence of abdominal aortic aneurysm. Atherosclerotic calcifications of the abdominal aorta and branch vessels. No suspicious abdominopelvic lymphadenopathy. Reproductive: Prostate is unremarkable. Other: Small to moderate perihepatic and pelvic ascites, with layering high density in the pelvis (series 3/image 82). Musculoskeletal: Postsurgical changes along the anterior abdominal wall. Status post PLIF at L2-5. Degenerative changes of the visualized thoracolumbar spine. IMPRESSION: Postsurgical changes related to recent transverse colectomy with right mid abdominal colostomy and long Hartmann's pouch. Suspected macroscopic colonic perforation just proximal to the transverse colostomy, with ill-defined fluid/gas collection in the right upper abdominal mesentery, as described above. Additional scattered foci of free air. Surgical consultation is suggested. Additional 6.4 cm loculated fluid collection in the left mid abdomen, suspicious for abscess, but associated with a loop of small bowel. Localized small bowel injury is not excluded. Small to moderate abdominopelvic ascites. Moderate bilateral effusions, new/progressive. Bilateral  lower lobe compressive atelectasis with suspected contrast aspiration. These results will be called to the ordering clinician or representative by the Radiologist Assistant, and communication documented in the PACS or [employer] Electronically Signed   By: SJulian HyM.D.   On: 11/16/2022 03:01   DG Abd 1 View  Result Date: 11/07/2022 CLINICAL DATA:  Abdominal pain EXAM: ABDOMEN - 1 VIEW COMPARISON:  CT  abdomen and pelvis December 9th 2023 FINDINGS: Of note, the lateral aspect of the right hemiabdomen in the upper abdomen are excluded from the field of view. Nonobstructive bowel gas pattern. Mild gastric distention. Moderate colonic stool burden. Partially visualized lumbar fusion hardware. No acute osseous abnormality. IMPRESSION: Nonobstructive bowel gas pattern. Electronically Signed   By: Beryle Flock M.D.   On: 11/07/2022 20:58   DG Chest Port 1 View  Result Date: 11/06/2022 CLINICAL DATA:  Follow-up pneumonia EXAM: PORTABLE CHEST 1 VIEW COMPARISON:  10/31/2022 FINDINGS: The cardiomediastinal silhouette is unchanged. Enteric tube has been removed. RIGHT PICC line is unchanged with tip overlying the LOWER SVC. LEFT LOWER lung consolidation/atelectasis is unchanged. Slightly increasing RIGHT LOWER lung opacities/effusion. There is no evidence of pneumothorax. IMPRESSION: 1. Slightly increasing RIGHT LOWER lung opacities/effusion. 2. Unchanged LEFT LOWER lung consolidation/atelectasis. Electronically Signed   By: Margarette Canada M.D.   On: 11/06/2022 16:40   ECHOCARDIOGRAM COMPLETE  Result Date: 11/05/2022    ECHOCARDIOGRAM REPORT   Patient Name:   ANMOL FLECK Allison Date of Exam: 10/31/2022 Medical Rec #:  [id number]            Height:       72.0 in Accession #:    [id number]           Weight:       196.6 lb Date of Birth:  [birth year]            BSA:          2.115 m Patient Age:    34 years             BP:           94/51 mmHg Patient Gender: M                    HR:           94  bpm. Exam Location:  Inpatient Procedure: 2D Echo, Cardiac Doppler and Color Doppler Indications:    Acute respiratory distress  History:        Patient has prior history of Echocardiogram examinations, most                 recent 05/06/2015. Risk Factors:Dyslipidemia, Sleep Apnea and                 Former Smoker. Amyloidosis. Hx DVT.  Sonographer:    Clayton Lefort RDCS (AE) Referring Phys: [id number] [facility]  Sonographer Comments: Echo performed with patient supine and on artificial respirator. Global longitudinal strain was attempted. IMPRESSIONS  1. Left ventricular ejection fraction, by estimation, is 50 to 55%. The left ventricle has low normal function. The left ventricle has no regional wall motion abnormalities. There is moderate asymmetric left ventricular hypertrophy of the basal-septal segment. Left ventricular diastolic parameters are indeterminate.  2. Right ventricular systolic function is normal. The right ventricular size is normal. Tricuspid regurgitation signal is inadequate for assessing PA pressure.  3. The mitral valve is normal in structure. Trivial mitral valve regurgitation. No evidence of mitral stenosis.  4. The aortic valve was not well visualized. Aortic valve regurgitation is not visualized. No aortic stenosis is present. FINDINGS  Left Ventricle: Left ventricular ejection fraction, by estimation, is 50 to 55%. The left ventricle has low normal function. The left ventricle has no regional wall motion abnormalities. Global longitudinal strain performed but not reported based on interpreter judgement due to suboptimal tracking. The left ventricular internal cavity size  was normal in size. There is moderate asymmetric left ventricular hypertrophy of the basal-septal segment. Left ventricular diastolic parameters are indeterminate. Right Ventricle: The right ventricular size is normal. No increase in right ventricular wall thickness. Right ventricular systolic function is normal. Tricuspid  regurgitation signal is inadequate for assessing PA pressure. Left Atrium: Left atrial size was normal in size. Right Atrium: Right atrial size was normal in size. Pericardium: There is no evidence of pericardial effusion. Mitral Valve: The mitral valve is normal in structure. Trivial mitral valve regurgitation. No evidence of mitral valve stenosis. Tricuspid Valve: The tricuspid valve is normal in structure. Tricuspid valve regurgitation is trivial. Aortic Valve: The aortic valve was not well visualized. Aortic valve regurgitation is not visualized. No aortic stenosis is present. Aortic valve mean gradient measures 2.0 mmHg. Aortic valve peak gradient measures 4.8 mmHg. Aortic valve area, by VTI measures 2.90 cm. Pulmonic Valve: The pulmonic valve was not well visualized. Pulmonic valve regurgitation is not visualized. Aorta: The aortic root and ascending aorta are structurally normal, with no evidence of dilitation. IAS/Shunts: The interatrial septum was not well visualized.  LEFT VENTRICLE PLAX 2D LVIDd:         3.90 cm LVIDs:         2.60 cm LV PW:         1.20 cm LV IVS:        1.20 cm LVOT diam:     2.20 cm LV SV:         49 LV SV Index:   23 LVOT Area:     3.80 cm  RIGHT VENTRICLE             IVC RV Basal diam:  3.10 cm     IVC diam: 2.00 cm RV S prime:     13.30 cm/s TAPSE (M-mode): 1.9 cm LEFT ATRIUM           Index        RIGHT ATRIUM           Index LA diam:      2.40 cm 1.13 cm/m   RA Area:     14.20 cm LA Vol (A2C): 25.4 ml 12.01 ml/m  RA Volume:   28.00 ml  13.24 ml/m LA Vol (A4C): 24.6 ml 11.63 ml/m  AORTIC VALVE AV Area (Vmax):    3.43 cm AV Area (Vmean):   3.33 cm AV Area (VTI):     2.90 cm AV Vmax:           110.00 cm/s AV Vmean:          68.400 cm/s AV VTI:            0.168 m AV Peak Grad:      4.8 mmHg AV Mean Grad:      2.0 mmHg LVOT Vmax:         99.20 cm/s LVOT Vmean:        59.900 cm/s LVOT VTI:          0.128 m LVOT/AV VTI ratio: 0.76  AORTA Ao Root diam: 3.60 cm Ao Asc diam:  3.30  cm  SHUNTS Systemic VTI:  0.13 m Systemic Diam: 2.20 cm Oswaldo Milian MD Electronically signed by Oswaldo Milian MD Signature Date/Time: 11/05/2022/9:51:13 AM    Final    [place] EKG SITE RITE  Result Date: 11/05/2022 If Site Rite image not attached, placement could not be confirmed due to current cardiac rhythm.  [place] EKG SITE RITE  Result  Date: 11/04/2022 If Site Rite image not attached, placement could not be confirmed due to current cardiac rhythm.  DG CHEST PORT 1 VIEW  Result Date: 10/31/2022 CLINICAL DATA:  Endotracheal tube EXAM: PORTABLE CHEST 1 VIEW COMPARISON:  10/05/2022 FINDINGS: Endotracheal tube with tip just below the clavicular heads. The enteric tube at least reaches the stomach. Artifact from EKG leads. Worsening opacity at the lung bases with hazy appearance. No edema, effusion, or pneumothorax. Normal heart size. IMPRESSION: 1. Unremarkable hardware. 2. Worsening aeration at the bases with hazy appearance favoring atelectasis. Electronically Signed   By: Jorje Guild M.D.   On: 10/31/2022 05:40   CT Angio Abd/Pel W and/or Wo Contrast  Result Date: 11/18/2022 CLINICAL DATA:  Acute mesenteric ischemia. EXAM: CTA ABDOMEN AND PELVIS WITHOUT AND WITH CONTRAST TECHNIQUE: Multidetector CT imaging of the abdomen and pelvis was performed using the standard protocol during bolus administration of intravenous contrast. Multiplanar reconstructed images and MIPs were obtained and reviewed to evaluate the vascular anatomy. RADIATION DOSE REDUCTION: This exam was performed according to the departmental dose-optimization program which includes automated exposure control, adjustment of the mA and/or kV according to patient size and/or use of iterative reconstruction technique. CONTRAST:  15m OMNIPAQUE IOHEXOL 350 MG/ML SOLN COMPARISON:  10/29/2012. FINDINGS: VASCULAR Aorta: Aortic atherosclerosis. Normal caliber aorta without aneurysm, dissection, vasculitis or significant stenosis.  Celiac: Patent without evidence of aneurysm, dissection, vasculitis or significant stenosis. SMA: Patent without evidence of aneurysm, dissection, vasculitis or significant stenosis. Renals: Both renal arteries are patent without evidence of aneurysm, dissection, vasculitis, fibromuscular dysplasia or significant stenosis. An accessory renal artery is noted on the right. IMA: Patent. Inflow: Patent without evidence of aneurysm, dissection, vasculitis or significant stenosis. Proximal Outflow: Bilateral common femoral and visualized portions of the superficial and profunda femoral arteries are patent without evidence of aneurysm, dissection, vasculitis or significant stenosis. Veins: No portal venous gas. Review of the MIP images confirms the above findings. NON-VASCULAR Lower chest: Heart is enlarged and there is a small pericardial effusion. There are trace bilateral pleural effusions with atelectasis at the lung bases. Hepatobiliary: No focal liver abnormality is seen. Stones are present within the gallbladder. No biliary ductal dilatation. Pancreas: Unremarkable. No pancreatic ductal dilatation or surrounding inflammatory changes. Spleen: Normal size. A vague hypodensity is present in the liver, most likely cyst or hemangioma. Adrenals/Urinary Tract: No adrenal nodule or mass. The kidneys enhance symmetrically. No renal calculus or hydronephrosis. Bladder is unremarkable. Stomach/Bowel: The stomach is distended with fluid and ingested debris. No bowel obstruction. Multiple foci of free air are noted in the upper abdomen and most pronounced in the mid left abdomen. Multiple scattered diverticula are present along the colon without evidence of diverticulitis. A moderate amount of retained stool is present in the colon. Mild colonic wall thickening involving the mid to distal transverse colon with suspected pneumatosis. Fat stranding is noted in the left upper quadrant. There is a focal defect in the wall of the mid  transverse colon, axial images 38 and 39, with perforation and focal collection of debris and air measuring 9.9 x 3.5 cm. Appendix is not seen. Lymphatic: No abdominal or pelvic lymphadenopathy. Reproductive: Prostate is unremarkable. Other: No abdominopelvic ascites. Musculoskeletal: Spinal fusion hardware is present L2-L5. Degenerative changes in the thoracolumbar spine. No acute or suspicious osseous abnormality. IMPRESSION: VASCULAR Aortic atherosclerosis without evidence of aneurysm. The aorta and large branch vessels are patent. NON-VASCULAR 1. Mild colonic wall thickening involving the mid to distal transverse colon with suspected  pneumatosis. No portal venous gas is seen, however findings remain concerning for mesenteric ischemia. There is a focal perforation in the distal transverse colon in the left upper quadrant with leakage of stool and free air in the abdomen, best visualized on axial images 38 and 39. 2. Trace bilateral pleural effusions with atelectasis at the lung bases. 3. Cholelithiasis. 4. Diverticulosis without diverticulitis. 5. Remaining incidental findings as described above. Critical Value/emergent results were called by telephone at the time of interpretation on 11/10/2022 at 9:39 pm to provider Davonna Belling , who verbally acknowledged these results. Electronically Signed   By: Brett Fairy M.D.   On: 11/05/2022 22:12    Microbiology No results found for this or any previous visit (from the past 240 hour(s)).  Lab Basic Metabolic Panel: No results for input(s): "NA", "K", "CL", "CO2", "GLUCOSE", "BUN", "CREATININE", "CALCIUM", "MG", "PHOS" in the last 168 hours. Liver Function Tests: No results for input(s): "AST", "ALT", "ALKPHOS", "BILITOT", "PROT", "ALBUMIN" in the last 168 hours. No results for input(s): "LIPASE", "AMYLASE" in the last 168 hours. No results for input(s): "AMMONIA" in the last 168 hours. CBC: Recent Labs  Lab 11/06/2022 0302  WBC 12.6*  HGB 7.7*  HCT  23.5*  MCV 92.5  PLT 90*   Cardiac Enzymes: No results for input(s): "CKTOTAL", "CKMB", "CKMBINDEX", "TROPONINI" in the last 168 hours. Sepsis Labs: Recent Labs  Lab 10/24/2022 0302  WBC 12.6*    Procedures/Operations  Exploratory laparotomy 12/10 Exploratory laparotomy 12/11 Exploratory laparotomy 12/20 Exploratory laparotomy 12/22   Larsen Zettel A Ivis Henneman 11/19/2022, 8:33 PM

## 2022-11-22 DEATH — deceased

## 2022-11-26 ENCOUNTER — Ambulatory Visit: Payer: Medicare Other | Admitting: Internal Medicine

## 2022-12-14 LAB — SURGICAL PATHOLOGY

## 2022-12-23 NOTE — Progress Notes (Signed)
Pressure injury stage one of the medial sacrum was present on admission

## 2023-01-11 MED FILL — Medication: Qty: 1 | Status: AC

## 2023-08-03 IMAGING — CR DG CERVICAL SPINE 2 OR 3 VIEWS
2 series · 2 of 2 positions shown · non-contrast
Comparison: 11/06/2021 cervical spine radiographs

CLINICAL DATA: ACDF C5-6

EXAM:
CERVICAL SPINE - 2-3 VIEW

[xtable lateral (1 of 2)]
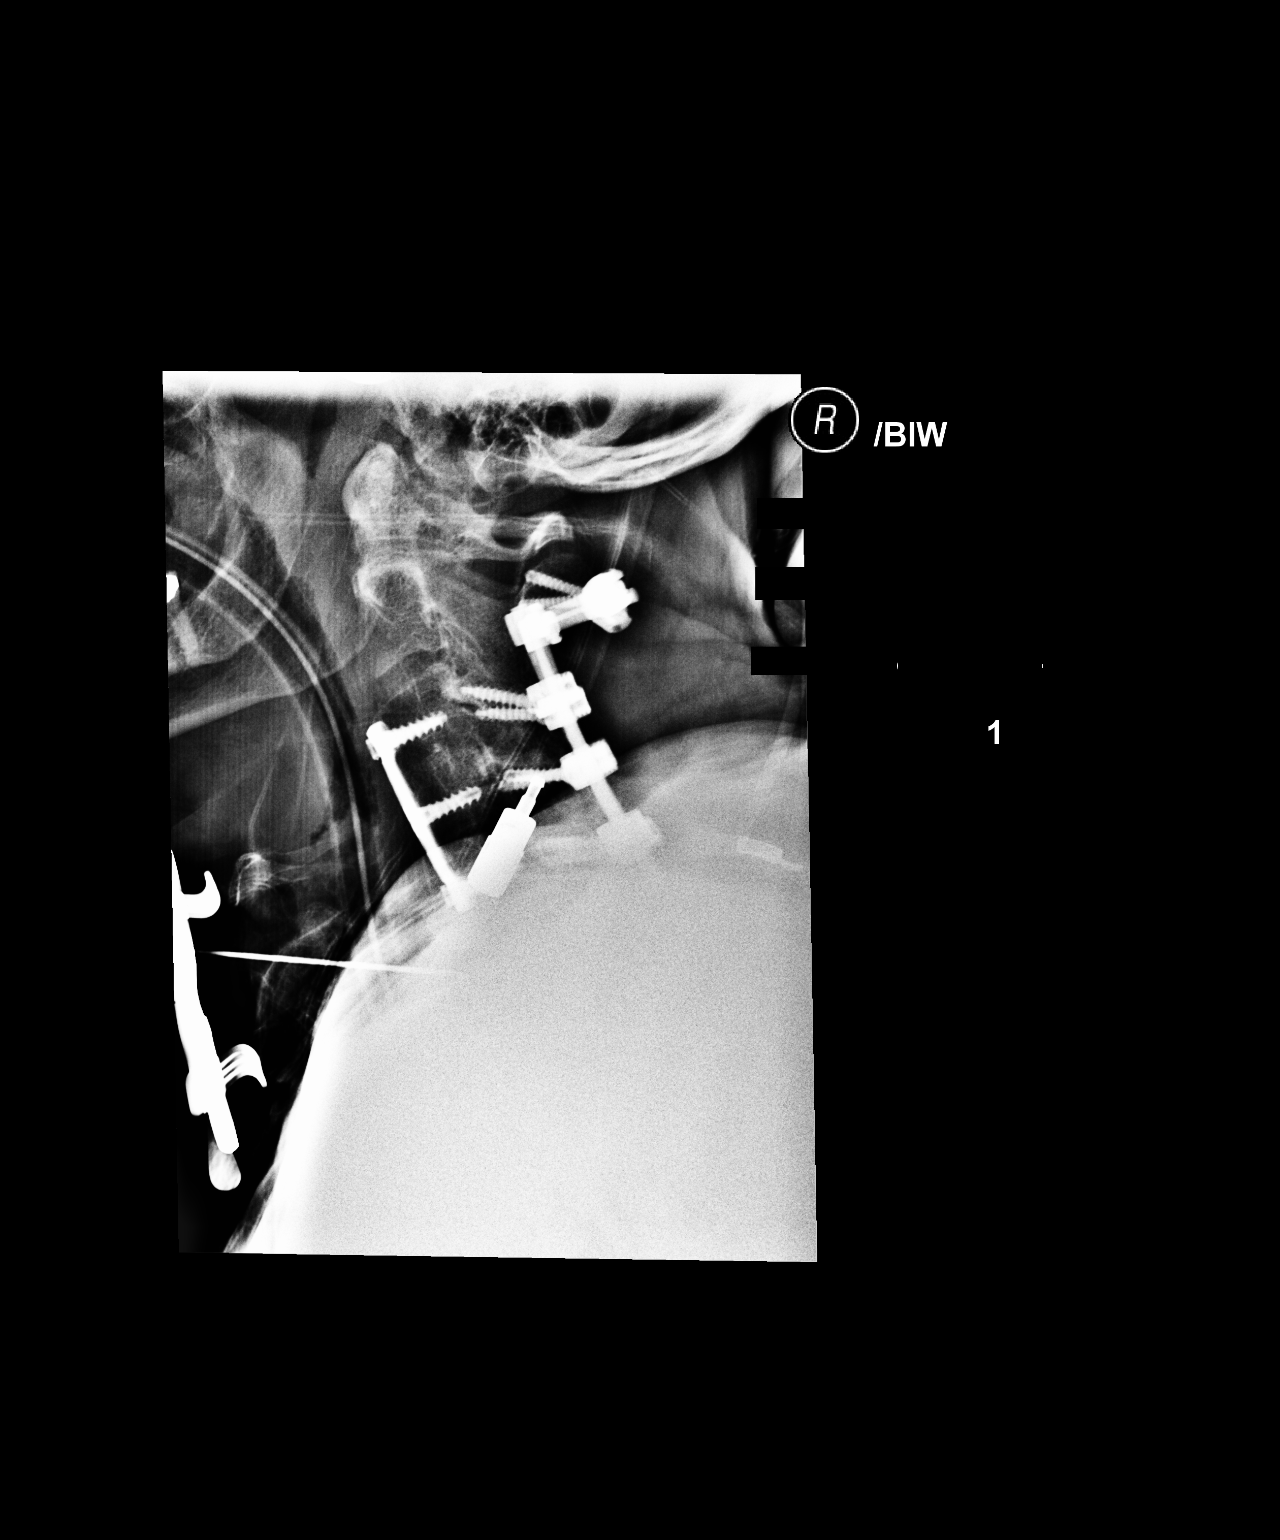

[xtable lateral (2 of 2)]
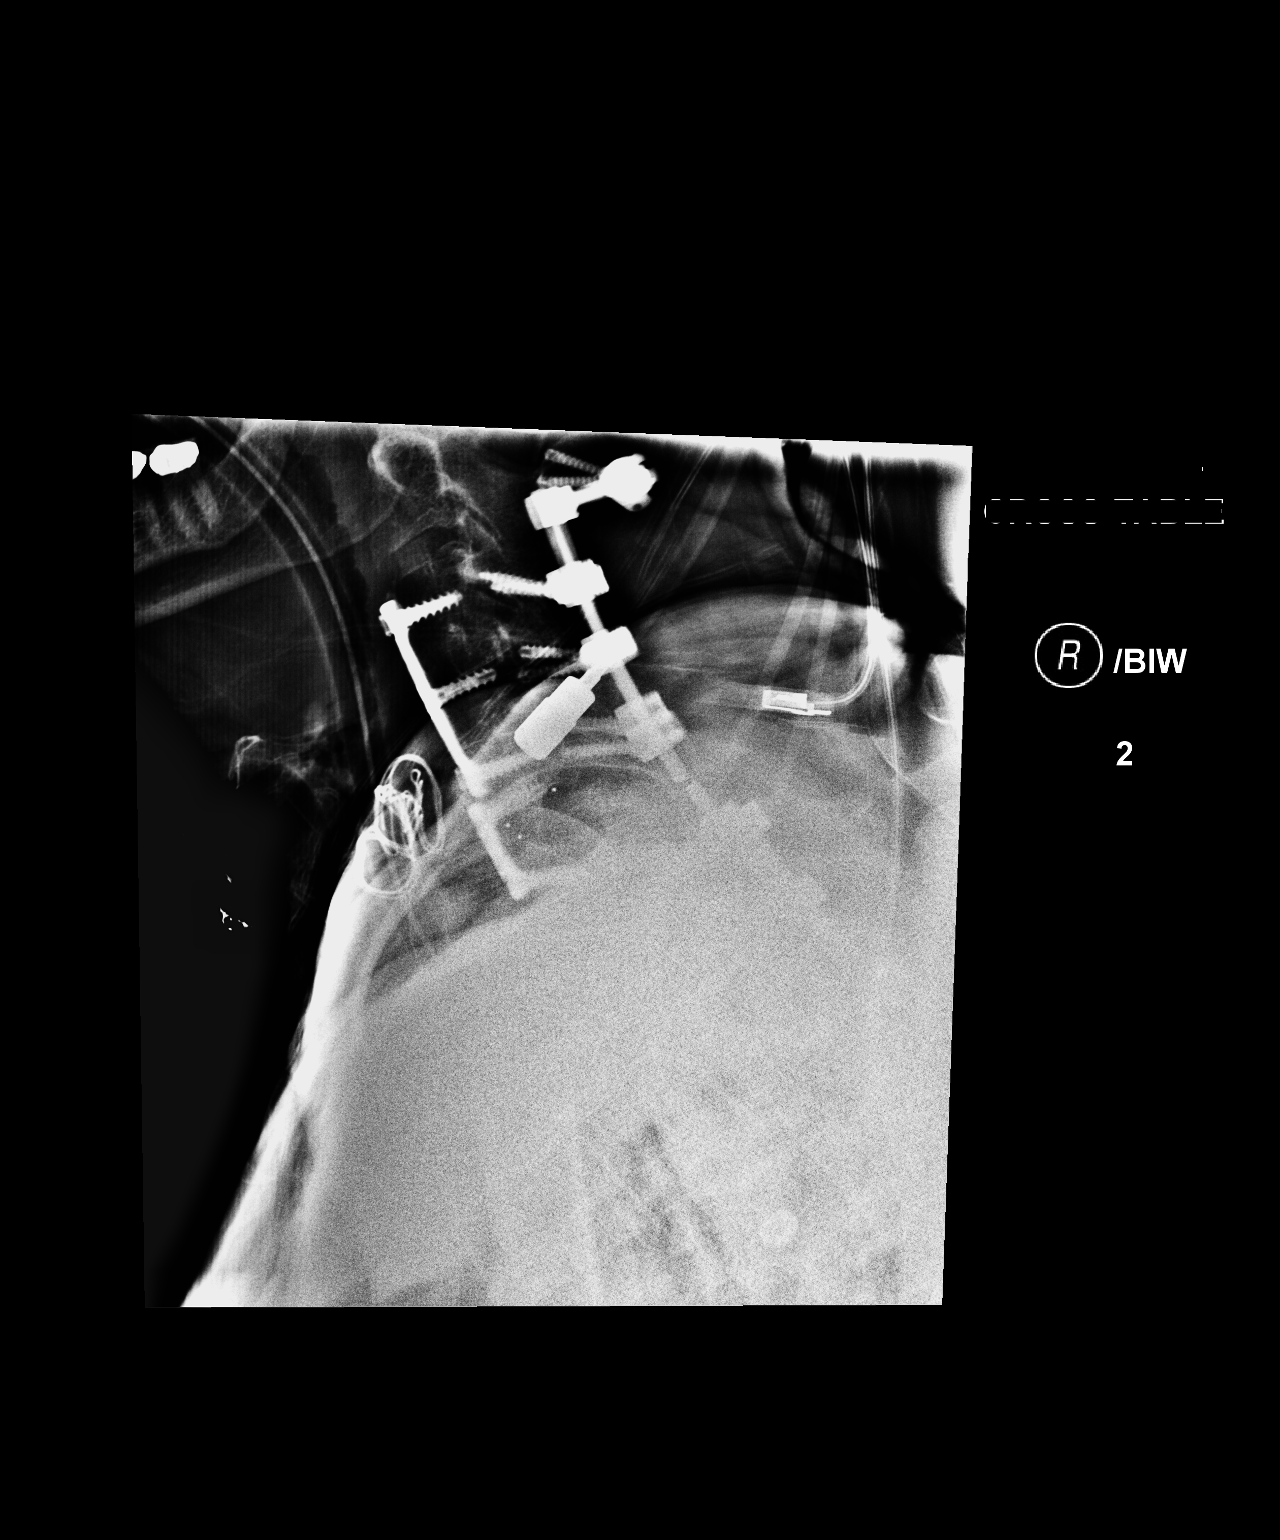

[2 of 2 positions shown; findings below may reference images not displayed]

FINDINGS: Cross-table lateral cervical spine radiographs demonstrate previous
surgical hardware from ACDF C3-5 and from bilateral posterior spinal
fusion hardware C2-5 and separate bilateral posterior spinal fusion
hardware extending inferiorly from C7 level. Initial radiograph
demonstrates anterior approach surgical marking device terminating
over the C5-6 prevertebral soft tissues. Second radiograph
demonstrates new surgical hardware from ACDF C5-6, which appears
grossly well-positioned with no evidence of hardware fracture or
loosening. Oral route tube enters trachea with tip not seen on these
views.
IMPRESSION: 1. Anterior approach surgical marking device terminates over the
C5-6 prevertebral soft tissues on the initial radiograph.
2. Expected postsurgical changes from interval ACDF C5-6 on the
second radiograph.

## 2024-01-04 NOTE — Telephone Encounter (Signed)
This encounter was created in error - please disregard.
